# Patient Record
Sex: Male | Born: 1960 | Hispanic: Yes | Marital: Married | Smoking: Never smoker
Health system: Southern US, Community
[De-identification: ages and names within clinical notes are randomized; demographics above are authoritative.]

## PROBLEM LIST (undated history)

## (undated) DIAGNOSIS — I255 Ischemic cardiomyopathy: Secondary | ICD-10-CM

## (undated) DIAGNOSIS — M87052 Idiopathic aseptic necrosis of left femur: Secondary | ICD-10-CM

## (undated) DIAGNOSIS — D509 Iron deficiency anemia, unspecified: Secondary | ICD-10-CM

## (undated) DIAGNOSIS — I2510 Atherosclerotic heart disease of native coronary artery without angina pectoris: Secondary | ICD-10-CM

## (undated) DIAGNOSIS — I776 Arteritis, unspecified: Secondary | ICD-10-CM

## (undated) DIAGNOSIS — I5020 Unspecified systolic (congestive) heart failure: Secondary | ICD-10-CM

## (undated) DIAGNOSIS — I7782 Antineutrophilic cytoplasmic antibody (ANCA) vasculitis: Secondary | ICD-10-CM

## (undated) DIAGNOSIS — E785 Hyperlipidemia, unspecified: Secondary | ICD-10-CM

## (undated) DIAGNOSIS — K2970 Gastritis, unspecified, without bleeding: Secondary | ICD-10-CM

## (undated) DIAGNOSIS — R6520 Severe sepsis without septic shock: Secondary | ICD-10-CM

## (undated) DIAGNOSIS — N1832 Chronic kidney disease, stage 3b: Secondary | ICD-10-CM

## (undated) DIAGNOSIS — I10 Essential (primary) hypertension: Secondary | ICD-10-CM

## (undated) DIAGNOSIS — M87051 Idiopathic aseptic necrosis of right femur: Secondary | ICD-10-CM

## (undated) DIAGNOSIS — A419 Sepsis, unspecified organism: Secondary | ICD-10-CM

## (undated) DIAGNOSIS — N289 Disorder of kidney and ureter, unspecified: Secondary | ICD-10-CM

## (undated) HISTORY — DX: Idiopathic aseptic necrosis of right femur: M87.052

## (undated) HISTORY — PX: CARDIAC SURGERY: SHX584

---

## 2007-07-11 ENCOUNTER — Inpatient Hospital Stay: Payer: Self-pay | Admitting: Internal Medicine

## 2007-07-23 ENCOUNTER — Ambulatory Visit: Payer: Self-pay | Admitting: Family Medicine

## 2016-05-17 DIAGNOSIS — R0602 Shortness of breath: Secondary | ICD-10-CM | POA: Insufficient documentation

## 2016-05-18 DIAGNOSIS — I10 Essential (primary) hypertension: Secondary | ICD-10-CM | POA: Insufficient documentation

## 2016-05-18 DIAGNOSIS — I7782 Antineutrophilic cytoplasmic antibody (ANCA) vasculitis: Secondary | ICD-10-CM | POA: Insufficient documentation

## 2016-05-18 DIAGNOSIS — F1010 Alcohol abuse, uncomplicated: Secondary | ICD-10-CM | POA: Insufficient documentation

## 2016-05-20 HISTORY — PX: LEFT HEART CATH AND CORONARY ANGIOGRAPHY: CATH118249

## 2016-05-21 DIAGNOSIS — R7303 Prediabetes: Secondary | ICD-10-CM | POA: Insufficient documentation

## 2016-05-21 DIAGNOSIS — I240 Acute coronary thrombosis not resulting in myocardial infarction: Secondary | ICD-10-CM | POA: Insufficient documentation

## 2016-05-21 DIAGNOSIS — E785 Hyperlipidemia, unspecified: Secondary | ICD-10-CM | POA: Insufficient documentation

## 2018-06-23 ENCOUNTER — Telehealth: Payer: Self-pay | Admitting: *Deleted

## 2018-06-23 ENCOUNTER — Other Ambulatory Visit: Payer: Self-pay

## 2018-06-23 DIAGNOSIS — Z20822 Contact with and (suspected) exposure to covid-19: Secondary | ICD-10-CM

## 2018-06-23 NOTE — Telephone Encounter (Signed)
Reordered as wrong site entered previous order. Pt scheduled at [facility] at 1415. Requested by [facility].  Pt with chills, cough.  Pts CB # [phone] (sons cell)

## 2018-06-23 NOTE — Telephone Encounter (Signed)
Pt scheduled for covid testing today at [facility] site. Requeted by [facility] Dept. Pt with cough, chills. Pt is coming to site with son and wife, also scheduled at 1415.   Testing process reviewed with son.  CB# [phone]

## 2018-06-25 NOTE — Telephone Encounter (Signed)
Mailed result to pt., per his request.

## 2018-06-25 NOTE — Telephone Encounter (Signed)
Faxed COVID Test results to [facility]. @ [phone].

## 2018-06-28 ENCOUNTER — Telehealth: Payer: Self-pay | Admitting: *Deleted

## 2018-06-28 LAB — NOVEL CORONAVIRUS, NAA: SARS-CoV-2, NAA: DETECTED — AB

## 2018-06-28 NOTE — Telephone Encounter (Signed)
Contacted by Joylene Igo, MLT, ASCP from [employer], pt positive COVID 19; values previously called 06/25/2018, and results faxed to [facility].

## 2019-05-01 ENCOUNTER — Other Ambulatory Visit: Payer: Self-pay

## 2019-05-01 ENCOUNTER — Encounter: Payer: Self-pay | Admitting: Emergency Medicine

## 2019-05-01 DIAGNOSIS — N170 Acute kidney failure with tubular necrosis: Secondary | ICD-10-CM | POA: Diagnosis present

## 2019-05-01 DIAGNOSIS — Z7902 Long term (current) use of antithrombotics/antiplatelets: Secondary | ICD-10-CM

## 2019-05-01 DIAGNOSIS — R6520 Severe sepsis without septic shock: Secondary | ICD-10-CM | POA: Diagnosis present

## 2019-05-01 DIAGNOSIS — I5032 Chronic diastolic (congestive) heart failure: Secondary | ICD-10-CM | POA: Diagnosis present

## 2019-05-01 DIAGNOSIS — N057 Unspecified nephritic syndrome with diffuse crescentic glomerulonephritis: Secondary | ICD-10-CM | POA: Diagnosis present

## 2019-05-01 DIAGNOSIS — I255 Ischemic cardiomyopathy: Secondary | ICD-10-CM | POA: Diagnosis present

## 2019-05-01 DIAGNOSIS — C642 Malignant neoplasm of left kidney, except renal pelvis: Secondary | ICD-10-CM | POA: Diagnosis present

## 2019-05-01 DIAGNOSIS — I252 Old myocardial infarction: Secondary | ICD-10-CM

## 2019-05-01 DIAGNOSIS — N1831 Chronic kidney disease, stage 3a: Secondary | ICD-10-CM | POA: Diagnosis present

## 2019-05-01 DIAGNOSIS — I776 Arteritis, unspecified: Secondary | ICD-10-CM | POA: Diagnosis present

## 2019-05-01 DIAGNOSIS — I130 Hypertensive heart and chronic kidney disease with heart failure and stage 1 through stage 4 chronic kidney disease, or unspecified chronic kidney disease: Secondary | ICD-10-CM | POA: Diagnosis present

## 2019-05-01 DIAGNOSIS — Z821 Family history of blindness and visual loss: Secondary | ICD-10-CM

## 2019-05-01 DIAGNOSIS — R072 Precordial pain: Secondary | ICD-10-CM | POA: Diagnosis not present

## 2019-05-01 DIAGNOSIS — N136 Pyonephrosis: Secondary | ICD-10-CM | POA: Diagnosis present

## 2019-05-01 DIAGNOSIS — Z7982 Long term (current) use of aspirin: Secondary | ICD-10-CM

## 2019-05-01 DIAGNOSIS — I424 Endocardial fibroelastosis: Secondary | ICD-10-CM | POA: Diagnosis present

## 2019-05-01 DIAGNOSIS — A419 Sepsis, unspecified organism: Principal | ICD-10-CM | POA: Diagnosis present

## 2019-05-01 DIAGNOSIS — I2510 Atherosclerotic heart disease of native coronary artery without angina pectoris: Secondary | ICD-10-CM | POA: Diagnosis present

## 2019-05-01 DIAGNOSIS — N401 Enlarged prostate with lower urinary tract symptoms: Secondary | ICD-10-CM | POA: Diagnosis present

## 2019-05-01 DIAGNOSIS — Z79899 Other long term (current) drug therapy: Secondary | ICD-10-CM

## 2019-05-01 DIAGNOSIS — E785 Hyperlipidemia, unspecified: Secondary | ICD-10-CM | POA: Diagnosis present

## 2019-05-01 DIAGNOSIS — Z955 Presence of coronary angioplasty implant and graft: Secondary | ICD-10-CM

## 2019-05-01 DIAGNOSIS — Z20822 Contact with and (suspected) exposure to covid-19: Secondary | ICD-10-CM | POA: Diagnosis present

## 2019-05-01 DIAGNOSIS — C799 Secondary malignant neoplasm of unspecified site: Secondary | ICD-10-CM | POA: Diagnosis present

## 2019-05-01 MED ORDER — OXYCODONE-ACETAMINOPHEN 5-325 MG PO TABS
1.0000 | ORAL_TABLET | ORAL | Status: DC | PRN
  Administered 2019-05-02: 1 via ORAL
  Filled 2019-05-01: qty 1

## 2019-05-01 NOTE — ED Triage Notes (Signed)
Patient with complaint of left lower back pain, frequency and blood in his urine that started this morning.

## 2019-05-02 ENCOUNTER — Encounter: Payer: Self-pay | Admitting: Internal Medicine

## 2019-05-02 ENCOUNTER — Inpatient Hospital Stay
Admission: EM | Admit: 2019-05-02 | Discharge: 2019-05-07 | DRG: 853 | Disposition: A | Discharge status: Home / Self Care | Payer: Self-pay | Attending: Student | Admitting: Student

## 2019-05-02 ENCOUNTER — Emergency Department: Payer: Self-pay

## 2019-05-02 DIAGNOSIS — I5032 Chronic diastolic (congestive) heart failure: Secondary | ICD-10-CM

## 2019-05-02 DIAGNOSIS — C642 Malignant neoplasm of left kidney, except renal pelvis: Secondary | ICD-10-CM

## 2019-05-02 DIAGNOSIS — N179 Acute kidney failure, unspecified: Secondary | ICD-10-CM

## 2019-05-02 DIAGNOSIS — R319 Hematuria, unspecified: Secondary | ICD-10-CM | POA: Diagnosis present

## 2019-05-02 DIAGNOSIS — A419 Sepsis, unspecified organism: Secondary | ICD-10-CM

## 2019-05-02 DIAGNOSIS — R918 Other nonspecific abnormal finding of lung field: Secondary | ICD-10-CM

## 2019-05-02 DIAGNOSIS — R590 Localized enlarged lymph nodes: Secondary | ICD-10-CM

## 2019-05-02 DIAGNOSIS — N1330 Unspecified hydronephrosis: Secondary | ICD-10-CM

## 2019-05-02 DIAGNOSIS — Z01810 Encounter for preprocedural cardiovascular examination: Secondary | ICD-10-CM

## 2019-05-02 DIAGNOSIS — I5020 Unspecified systolic (congestive) heart failure: Secondary | ICD-10-CM

## 2019-05-02 DIAGNOSIS — I2510 Atherosclerotic heart disease of native coronary artery without angina pectoris: Secondary | ICD-10-CM

## 2019-05-02 DIAGNOSIS — N3000 Acute cystitis without hematuria: Secondary | ICD-10-CM

## 2019-05-02 DIAGNOSIS — R6520 Severe sepsis without septic shock: Secondary | ICD-10-CM | POA: Diagnosis present

## 2019-05-02 DIAGNOSIS — R310 Gross hematuria: Secondary | ICD-10-CM

## 2019-05-02 DIAGNOSIS — N390 Urinary tract infection, site not specified: Secondary | ICD-10-CM

## 2019-05-02 DIAGNOSIS — Z515 Encounter for palliative care: Secondary | ICD-10-CM

## 2019-05-02 DIAGNOSIS — R911 Solitary pulmonary nodule: Secondary | ICD-10-CM

## 2019-05-02 DIAGNOSIS — N2889 Other specified disorders of kidney and ureter: Secondary | ICD-10-CM

## 2019-05-02 DIAGNOSIS — I10 Essential (primary) hypertension: Secondary | ICD-10-CM

## 2019-05-02 DIAGNOSIS — Z8679 Personal history of other diseases of the circulatory system: Secondary | ICD-10-CM

## 2019-05-02 HISTORY — DX: Atherosclerotic heart disease of native coronary artery without angina pectoris: I25.10

## 2019-05-02 HISTORY — DX: Disorder of kidney and ureter, unspecified: N28.9

## 2019-05-02 HISTORY — DX: Essential (primary) hypertension: I10

## 2019-05-02 HISTORY — DX: Hyperlipidemia, unspecified: E78.5

## 2019-05-02 HISTORY — DX: Ischemic cardiomyopathy: I25.5

## 2019-05-02 HISTORY — DX: Arteritis, unspecified: I77.6

## 2019-05-02 HISTORY — DX: Unspecified systolic (congestive) heart failure: I50.20

## 2019-05-02 HISTORY — DX: Antineutrophilic cytoplasmic antibody (ANCA) vasculitis: I77.82

## 2019-05-02 LAB — URINALYSIS, COMPLETE (UACMP) WITH MICROSCOPIC
Bilirubin Urine: NEGATIVE
Glucose, UA: 50 mg/dL — AB
Ketones, ur: NEGATIVE mg/dL
Leukocytes,Ua: NEGATIVE
Nitrite: NEGATIVE
Protein, ur: 100 mg/dL — AB
RBC / HPF: >50 RBC/hpf — ABNORMAL HIGH (ref 0–5)
Specific Gravity, Urine: 1.012 (ref 1.005–1.030)
Squamous Epithelial / HPF: NONE SEEN (ref 0–5)
pH: 6 (ref 5.0–8.0)

## 2019-05-02 LAB — BASIC METABOLIC PANEL
Anion gap: 12 (ref 5–15)
BUN: 17 mg/dL (ref 6–20)
CO2: 22 mmol/L (ref 22–32)
Calcium: 8.5 mg/dL — ABNORMAL LOW (ref 8.9–10.3)
Chloride: 96 mmol/L — ABNORMAL LOW (ref 98–111)
Creatinine, Ser: 1.29 mg/dL — ABNORMAL HIGH (ref 0.61–1.24)
GFR calc Af Amer: >60 mL/min (ref >60)
GFR calc non Af Amer: >60 mL/min (ref >60)
Glucose, Bld: 106 mg/dL — ABNORMAL HIGH (ref 70–99)
Potassium: 3.7 mmol/L (ref 3.5–5.1)
Sodium: 130 mmol/L — ABNORMAL LOW (ref 135–145)

## 2019-05-02 LAB — RESPIRATORY PANEL BY RT PCR (FLU A&B, COVID)
Influenza A by PCR: NEGATIVE
Influenza B by PCR: NEGATIVE
SARS Coronavirus 2 by RT PCR: NEGATIVE

## 2019-05-02 LAB — CBC
HCT: 41.9 % (ref 39.0–52.0)
HCT: 43.2 % (ref 39.0–52.0)
Hemoglobin: 14.6 g/dL (ref 13.0–17.0)
Hemoglobin: 14.8 g/dL (ref 13.0–17.0)
MCH: 30.2 pg (ref 26.0–34.0)
MCH: 30.5 pg (ref 26.0–34.0)
MCHC: 33.8 g/dL (ref 30.0–36.0)
MCHC: 35.3 g/dL (ref 30.0–36.0)
MCV: 86.2 fL (ref 80.0–100.0)
MCV: 89.4 fL (ref 80.0–100.0)
Platelets: 173 10*3/uL (ref 150–400)
Platelets: 185 10*3/uL (ref 150–400)
RBC: 4.83 MIL/uL (ref 4.22–5.81)
RBC: 4.86 MIL/uL (ref 4.22–5.81)
RDW: 12.8 % (ref 11.5–15.5)
RDW: 12.9 % (ref 11.5–15.5)
WBC: 11.1 10*3/uL — ABNORMAL HIGH (ref 4.0–10.5)
WBC: 13.7 10*3/uL — ABNORMAL HIGH (ref 4.0–10.5)
nRBC: 0 % (ref 0.0–0.2)
nRBC: 0 % (ref 0.0–0.2)

## 2019-05-02 LAB — LACTIC ACID, PLASMA
Lactic Acid, Venous: 2.4 mmol/L (ref 0.5–1.9)
Lactic Acid, Venous: 1 mmol/L (ref 0.5–1.9)
Lactic Acid, Venous: 1.1 mmol/L (ref 0.5–1.9)

## 2019-05-02 LAB — TROPONIN I (HIGH SENSITIVITY)
Troponin I (High Sensitivity): 12 ng/L (ref <18)
Troponin I (High Sensitivity): 10 ng/L (ref <18)

## 2019-05-02 LAB — APTT: aPTT: 29 seconds (ref 24–36)

## 2019-05-02 LAB — PROTIME-INR
INR: 0.9 (ref 0.8–1.2)
Prothrombin Time: 12.3 seconds (ref 11.4–15.2)

## 2019-05-02 LAB — HIV ANTIBODY (ROUTINE TESTING W REFLEX): HIV Screen 4th Generation wRfx: NONREACTIVE

## 2019-05-02 MED ORDER — ATORVASTATIN CALCIUM 80 MG PO TABS
80.0000 mg | ORAL_TABLET | Freq: Every day | ORAL | Status: DC
  Administered 2019-05-02 – 2019-05-06 (×5): 80 mg via ORAL
  Filled 2019-05-02: qty 1
  Filled 2019-05-02: qty 4
  Filled 2019-05-02: qty 1
  Filled 2019-05-02: qty 4
  Filled 2019-05-02 (×3): qty 1
  Filled 2019-05-02: qty 4
  Filled 2019-05-02: qty 1
  Filled 2019-05-02 (×2): qty 4

## 2019-05-02 MED ORDER — ACETAMINOPHEN 325 MG PO TABS
650.0000 mg | ORAL_TABLET | Freq: Four times a day (QID) | ORAL | Status: DC | PRN
  Administered 2019-05-02 – 2019-05-07 (×4): 650 mg via ORAL
  Filled 2019-05-02 (×4): qty 2

## 2019-05-02 MED ORDER — HYDRALAZINE HCL 20 MG/ML IJ SOLN
10.0000 mg | Freq: Four times a day (QID) | INTRAMUSCULAR | Status: DC | PRN
  Administered 2019-05-02 – 2019-05-05 (×2): 10 mg via INTRAVENOUS
  Filled 2019-05-02 (×2): qty 1

## 2019-05-02 MED ORDER — LACTATED RINGERS IV BOLUS
1000.0000 mL | Freq: Once | INTRAVENOUS | Status: AC
  Administered 2019-05-02: 1000 mL via INTRAVENOUS

## 2019-05-02 MED ORDER — OXYCODONE HCL 5 MG PO TABS
5.0000 mg | ORAL_TABLET | Freq: Once | ORAL | Status: AC
  Administered 2019-05-02: 18:00:00 5 mg via ORAL
  Filled 2019-05-02: qty 1

## 2019-05-02 MED ORDER — METOPROLOL SUCCINATE ER 50 MG PO TB24
150.0000 mg | ORAL_TABLET | Freq: Every day | ORAL | Status: DC
  Administered 2019-05-02 – 2019-05-07 (×5): 150 mg via ORAL
  Filled 2019-05-02 (×5): qty 1

## 2019-05-02 MED ORDER — LABETALOL HCL 5 MG/ML IV SOLN: 10.0000 mg | INTRAVENOUS | Status: DC | PRN

## 2019-05-02 MED ORDER — ACETAMINOPHEN 650 MG RE SUPP: 650.0000 mg | Freq: Four times a day (QID) | RECTAL | Status: DC | PRN

## 2019-05-02 MED ORDER — MORPHINE SULFATE (PF) 2 MG/ML IV SOLN: 2.0000 mg | INTRAVENOUS | Status: DC | PRN

## 2019-05-02 MED ORDER — MORPHINE SULFATE (PF) 4 MG/ML IV SOLN
4.0000 mg | Freq: Once | INTRAVENOUS | Status: AC
  Administered 2019-05-02: 4 mg via INTRAVENOUS
  Filled 2019-05-02: qty 1

## 2019-05-02 MED ORDER — ONDANSETRON HCL 4 MG/2ML IJ SOLN
4.0000 mg | Freq: Once | INTRAMUSCULAR | Status: AC
  Administered 2019-05-02: 4 mg via INTRAVENOUS
  Filled 2019-05-02: qty 2

## 2019-05-02 MED ORDER — MORPHINE SULFATE (PF) 4 MG/ML IV SOLN
4.0000 mg | INTRAVENOUS | Status: DC | PRN
  Administered 2019-05-02 – 2019-05-07 (×10): 4 mg via INTRAVENOUS
  Filled 2019-05-02 (×11): qty 1

## 2019-05-02 MED ORDER — SODIUM CHLORIDE 0.9 % IV SOLN
1.0000 g | Freq: Once | INTRAVENOUS | Status: AC
  Administered 2019-05-02: 03:00:00 1 g via INTRAVENOUS
  Filled 2019-05-02: qty 10

## 2019-05-02 MED ORDER — ONDANSETRON HCL 4 MG/2ML IJ SOLN: 4.0000 mg | Freq: Four times a day (QID) | INTRAMUSCULAR | Status: DC | PRN

## 2019-05-02 MED ORDER — SODIUM CHLORIDE 0.9 % IV SOLN
1.0000 g | INTRAVENOUS | Status: DC
  Administered 2019-05-03 – 2019-05-06 (×4): 1 g via INTRAVENOUS
  Filled 2019-05-02: qty 1
  Filled 2019-05-02: qty 10
  Filled 2019-05-02 (×2): qty 1

## 2019-05-02 MED ORDER — LISINOPRIL 10 MG PO TABS: 10.0000 mg | ORAL_TABLET | Freq: Every day | ORAL | Status: DC

## 2019-05-02 MED ORDER — SODIUM CHLORIDE 0.9 % IV SOLN: INTRAVENOUS | Status: DC

## 2019-05-02 MED ORDER — ONDANSETRON HCL 4 MG PO TABS: 4.0000 mg | ORAL_TABLET | Freq: Four times a day (QID) | ORAL | Status: DC | PRN

## 2019-05-02 NOTE — Progress Notes (Signed)
Communication took place with Dr. Damita Dunnings, she will be ordering another lactic acid

## 2019-05-02 NOTE — Progress Notes (Signed)
NP Ouma notified of PT's bp 168/106, did not want to give prn labatalol at this time. Also asked for clarification on fluid rate.

## 2019-05-02 NOTE — Consult Note (Signed)
Cardiology Consultation:   Patient ID: Mark Donaldson; [id number]; 1960/11/08   Admit date: 05/02/2019 Date of Consult: 05/02/2019  Primary Care Provider: System, Pcp Not In Primary Cardiologist: [facility]   Patient Profile:   Mark Donaldson is a 59 y.o. 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 Clementeen Graham.  History of Present Illness:   Mark Donaldson was admitted to [facility] in 05/2016 with a NSTEMI. Echo at that time showed an EF of 30-35%, mild LVH, Gr2DD, mildly dilated left atrium, mild to moderate MR, aortic sclerosis, and normal RVSF. PET myocardial perfusion imaging showed a moderate in size, mild and severely comminuted completely reversible defect involving the apical, apical anterior, apical lateral, apical inferior, and apical septal segments consistent with ischemia.  EF 20%.  In this setting, he underwent diagnostic LHC which showed significant one-vessel CAD with 90% LAD stenosis status post PCI/DES x 2.  Following evidence-based therapy and percutaneous revascularization repeat echo in 02/2017 showed an improved LV SF with an EF greater than 85%, grade 1 diastolic dysfunction, mild mitral regurgitation, aortic valve sclerosis, low normal RV systolic function, mild tricuspid regurgitation.  He was last seen by his primary cardiologist in 03/2018 and was doing well from a cardiac perspective.  He presented to [facility] on 05/02/2019 with left lower back pain and hematuria.  Imaging demonstrated a large solid mass involving the lower pole of the left kidney concerning for RCC measuring 7.4 cm.  He has been consulted on by urology was recommended for  nephrectomy with timing to be determined.  At baseline, patient has been doing very well from a cardiac perspective.  He indicates he has not followed up with [facility] cardiology because he "has been doing good."  He reports compliance with all medications, last taking clopidogrel on 04/29/2019.  He remains very active at baseline without any symptoms concerning for angina.  No lower extremity swelling, dizziness, presyncope, syncope.  This morning, he did develop some mild anterior wall chest discomfort which is worse to his palpation.  Outside of that, he does not have any issues or complaints.   Past Medical History:  Diagnosis Date  . ANCA-positive vasculitis ([place])   . CAD (coronary artery disease)   . Essential hypertension   . HFrEF (heart failure with reduced ejection fraction) ([place])   . Hyperlipidemia LDL goal <70   . Ischemic cardiomyopathy   . Renal disorder     Past Surgical History:  Procedure Laterality Date  . CARDIAC SURGERY     cardiac cath with 2 stent placement     Home Meds: Prior to Admission medications   Medication Sig Start Date End Date Taking? Authorizing Provider  aspirin 81 MG chewable tablet Chew 1 tablet by mouth daily. 02/24/19 02/24/20 Yes [provider]  atorvastatin (LIPITOR) 80 MG tablet Take 1 tablet by mouth at bedtime. 02/24/19 02/24/20 Yes [provider]  clopidogrel (PLAVIX) 75 MG tablet Take 1 tablet by mouth daily. 02/24/19 02/24/20 Yes [provider]  furosemide (LASIX) 40 MG tablet Take 1 tablet by mouth daily. 02/24/19 02/24/20 Yes [provider]  lisinopril (ZESTRIL) 10 MG tablet Take 1 tablet by mouth daily. 02/24/19  02/24/20 Yes [provider]  metoprolol succinate (TOPROL-XL) 100 MG 24 hr tablet Take 1.5 tablets by mouth daily. 02/24/19 02/24/20 Yes [provider]  spironolactone (ALDACTONE) 25 MG tablet Take 12.5 mg by mouth daily. 02/24/19 09/01/19 Yes [provider]    Inpatient  Medications: Scheduled Meds: . atorvastatin  80 mg Oral QHS  . metoprolol succinate  150 mg Oral Daily   Continuous Infusions: . sodium chloride 125 mL/hr at 05/02/19 0850  . [START ON 05/03/2019] cefTRIAXone (ROCEPHIN)  IV     PRN Meds: acetaminophen **OR** acetaminophen, hydrALAZINE, morphine injection, ondansetron **OR** ondansetron (ZOFRAN) IV  Allergies:  No Known Allergies  Social History:   Social History   Socioeconomic History  . Marital status: Married    Spouse name: Not on file  . Number of children: Not on file  . Years of education: Not on file  . Highest education level: Not on file  Occupational History  . Not on file  Tobacco Use  . Smoking status: Never Smoker  . Smokeless tobacco: Never Used  Substance and Sexual Activity  . Alcohol use: Yes  . Drug use: Not on file  . Sexual activity: Not on file  Other Topics Concern  . Not on file  Social History Narrative  . Not on file   Social Determinants of Health   Financial Resource Strain:   . Difficulty of Paying Living Expenses:   Food Insecurity:   . Worried About Charity fundraiser in the Last Year:   . Arboriculturist in the Last Year:   Transportation Needs:   . Film/video editor (Medical):   Marland Kitchen Lack of Transportation (Non-Medical):   Physical Activity:   . Days of Exercise per Week:   . Minutes of Exercise per Session:   Stress:   . Feeling of Stress :   Social Connections:   . Frequency of Communication with Friends and Family:   . Frequency of Social Gatherings with Friends and Family:   . Attends Religious Services:   . Active Member of Clubs or Organizations:   . Attends Archivist Meetings:   Marland Kitchen Marital Status:   Intimate Partner Violence:   . Fear of Current or Ex-Partner:   . Emotionally Abused:   Marland Kitchen Physically Abused:   . Sexually Abused:      Family History:   Family History  Problem Relation Age of Onset  . Cancer Mother   . Blindness Brother     ROS:   Review of Systems  Constitutional: Negative for chills, diaphoresis, fever, malaise/fatigue and weight loss.  HENT: Negative for congestion.   Eyes: Negative for discharge and redness.  Respiratory: Negative for cough, sputum production, shortness of breath and wheezing.   Cardiovascular: Positive for chest pain. Negative for palpitations, orthopnea, claudication, leg swelling and PND.  Gastrointestinal: Negative for abdominal pain, blood in stool, heartburn, melena, nausea and vomiting.  Genitourinary: Positive for flank pain, frequency and hematuria.  Musculoskeletal: Negative for falls and myalgias.  Skin: Negative for rash.  Neurological: Negative for dizziness, tingling, tremors, sensory change, speech change, focal weakness, loss of consciousness and weakness.  Endo/Heme/Allergies: Does not bruise/bleed easily.  Psychiatric/Behavioral: Negative for substance abuse. The patient is not nervous/anxious.   All other systems reviewed and are negative.     Physical Exam/Data:   Vitals:   05/02/19 0541 05/02/19 0813 05/02/19 1000 05/02/19 1327  BP: (!) 168/108 (!) 168/102 (!) 151/93 (!) 163/93  Pulse: Marland Kitchen)  101 86 94 (!) 106  Resp: 16 16  16   Temp: 98.3 F (36.8 C) 98.3 F (36.8 C)  98.8 F (37.1 C)  TempSrc: Oral Oral  Oral  SpO2: 95% 100%  99%  Weight: 69.9 kg     Height: 5\' 5"  (1.651 m)       Intake/Output Summary (Last 24 hours) at 05/02/2019 1516 Last data filed at 05/02/2019 1300 Gross per 24 hour  Intake -  Output 1050 ml  Net -1050 ml   Filed Weights   05/01/19 2336 05/02/19 0541  Weight: 71.7 kg 69.9 kg   Body mass index is 25.64 kg/m.   Physical Exam: General: Well developed, well nourished, in no acute distress. Head: Normocephalic, atraumatic, sclera non-icteric, no xanthomas, nares without discharge.  Neck: Negative for carotid bruits. JVD not elevated. Lungs: Clear bilaterally to auscultation without wheezes, rales, or rhonchi. Breathing is unlabored.  Heart: RRR with S1 S2. No murmurs, rubs, or gallops appreciated.  Palpation to the anterior chest wall reproduces his pain. Abdomen: Soft, non-tender, non-distended with normoactive bowel sounds. No hepatomegaly. No rebound/guarding. No obvious abdominal masses. Msk:  Strength and tone appear normal for age. Extremities: No clubbing or cyanosis. No edema. Distal pedal pulses are 2+ and equal bilaterally. Neuro: Alert and oriented X 3. No facial asymmetry. No focal deficit. Moves all extremities spontaneously. Psych:  Responds to questions appropriately with a normal affect.   EKG:  The EKG was personally reviewed and demonstrates: none available for review  Telemetry:  Telemetry was personally reviewed and demonstrates: SR  Weights: [employer]   05/01/19 2336 05/02/19 0541  Weight: 71.7 kg 69.9 kg    Relevant CV Studies:  2D echo 05/2016:  Left ventricular hypertrophy - mild  Moderately to severely decreased left ventricular systolic function,  ejection fraction 30 to 20%  Diastolic dysfunction - grade II (elevated filling pressures)  Dilated left atrium - mild  Mitral regurgitation - mild to moderate  Aortic sclerosis  Normal right ventricular systolic function __________  LHC 05/2016: Coronary Angiography  Dominance: Right  Left Main: The left main (LMCA) is a large-caliber vessel that originates  from the left coronary sinus. It bifurcates into the left anterior  descending (LAD) and left circumflex (LCx) arteries. There is a 25%  proximal LMCA stenosis.  LAD: The LAD is a large-caliber vessel that gives off two diagonal (D)  branches before it wraps around the apex. D1 is a moderate-caliber vessel  with 70% ostial stenosis. D2 is a large-caliber vessel. There is a 90% mid  LAD stenosis and a 40% distal LAD stenosis.  Left Circumflex: The LCx is a large-caliber vessel that gives off four  obtuse marginal (OM) branches and then continues as a small vessel in  the  AV groove. OM1-OM3 are very small vessels. OM4 is a large-caliber vessel.  There is mild diffuse disease in the LCx.  Right Coronary: The right coronary artery (RCA) is a large-caliber vessel  originating from the right coronary sinus. It bifurcates distally into the  posterior descending artery (PDA) and a posterolateral branch (PL)  consistent with a right dominant system. There is mild diffuse disease  predominantly in the proximal RCA.  Based on the above findings, the decision was made to perform PCI on the  mid LAD lesion(s).  PCI Procedural details Percutaneous coronary intervention performed of the mid LAD lesion. Anticoagulants: Urgent, ASA, Ticagrelor, Unfractionated Heparin Guide catheter: EBU 3.5 Guide Wire: 0.014" [place] Pre-Dilation Balloon: 2.0 mm Stent:  Xience (drug eluting stent) - 2.5x18, 2.5x8 Post-Dilation Balloon: 3.0 mm Result:  Excellent angiographic result with TIMI 3 flow  __________  2D echo 02/2017:  Left ventricular hypertrophy - mild to moderate  Overall normal left ventricular systolic function, ejection fraction >  49%  Diastolic dysfunction - grade I (normal filling pressures)  Degenerative mitral valve disease  Mitral regurgitation - mild  Aortic sclerosis  Low normal right ventricular systolic function  Tricuspid regurgitation - mild  Peak LV global longitudinal strain: -13.4%   Laboratory Data:  Chemistry Recent Labs  Lab 05/01/19 2344  NA 130*  K 3.7  CL 96*  CO2 22  GLUCOSE 106*  BUN 17  CREATININE 1.29*  CALCIUM 8.5*  GFRNONAA >60  GFRAA >60  ANIONGAP 12    No results for input(s): PROT, ALBUMIN, AST, ALT, ALKPHOS, BILITOT in the last 168 hours. Hematology Recent Labs  Lab 05/01/19 2344 05/02/19 0629  WBC 13.7* 11.1*  RBC 4.83 4.86  HGB 14.6 14.8  HCT 43.2 41.9  MCV 89.4 86.2  MCH 30.2 30.5  MCHC 33.8 35.3  RDW 12.8 12.9  PLT 185 173   Cardiac EnzymesNo results for input(s): TROPONINI in  the last 168 hours. No results for input(s): TROPIPOC in the last 168 hours.  BNPNo results for input(s): BNP, PROBNP in the last 168 hours.  DDimer No results for input(s): DDIMER in the last 168 hours.  Radiology/Studies:  CT Renal Stone Study  Result Date: 05/02/2019 IMPRESSION: Large solid mass involving the lower pole of the left kidney concerning for renal cell carcinoma measuring up to 7.4 cm. Mild left hydronephrosis, possibly related to mass effect or blood clot within the left ureter. Diffusely thick walled bladder, likely related to prostate enlargement. Electronically Signed   By: Rolm Baptise M.D.   On: 05/02/2019 01:24    Assessment and Plan:   1. Preoperative cardiac risk stratification: -Planning for nephrectomy for renal mass suspicious for RCC  -Obtain preoperative EKG and echo -Revised Cardiac Index: He is high risk for noncardiac surgery with an estimated risk of >11% for adverse cardiac even in the perioperative timeframe  -In the setting of lack on anginal symptoms, no further cardiac testing or intervention with further reduce this risk  -[facility] Activity Status Index: >4 METs -Further recommendations regarding risk stratification pending the above outstanding studies   2. CAD involving the native coronary arteries s/p PCI to the LAD without angina: -He does note some mild intermittent chest discomfort that has developed earlier this morning which is reproducible to his palpation and does not feel similar to his prior angina.  On exam his anterior chest wall is tender to palpation -Check EKG and HS-Tn x 2 -Hold Plavix in the preoperative timeframe, last dose 04/29/2019 (will need to be off 7 days) -Recommend continuing ASA 81 mg daily unless contraindicated from a surgical perspective  -No plans for inpatient ischemic work up at this time if echo demonstrates preserved LVSF  3.  HFrEF secondary to ICM: -Prior EF of 35% subsequently normalized by follow-up echo following  percutaneous revascularization and GDMT -Toprol XL -PTA lisinopril and spironolactone have been held with concern for ATN -His renal function at presentation was consistent with prior values in 2018 -If his renal function remains stable, recommend resuming GDMT -Echo pending  4.  Left renal mass suspicious for RCC with sepsis secondary to UTI: -Urology has been consulted with recommendation to proceed with nephrectomy with timing to be determined -Management per primary service  5.  HTN: -Blood pressure suboptimally controlled -Remains on PTA Toprol XL -Recommend resuming PTA lisinopril and spironolactone if renal function remains stable  -PRN hydralazine   6.  HLD: -Last LDL 77 from 2018 with goal being less than 70 -PTA Lipitor   7.  CKD stage II: -Baseline renal function appears to be around 1.2 when reviewing labs from 2018   For questions or updates, please contact [place] Please consult [email or web address] for contact info under Cardiology/STEMI.   Signed, Christell Faith, PA-C [facility] Pager: [phone] 05/02/2019, 3:16 PM

## 2019-05-02 NOTE — Progress Notes (Signed)
Bedside Rn Anderson Malta to ask about repeating lactic acid

## 2019-05-02 NOTE — ED Provider Notes (Signed)
[facility] Emergency Department Provider Note  ____________________________________________  Time seen: Approximately 2:32 AM  I have reviewed the triage vital signs and the nursing notes.   HISTORY  Chief Complaint Flank Pain and Hematuria   HPI Mark Donaldson is a 59 y.o. male with history of CAD status post stent, CHF with preserved EF who presents for evaluation of flank pain and hematuria.  Symptoms started this morning.  Is complaining of a pressure-like pain located in his left flank which is severe and constant since this morning.  Has had frequency, dysuria and hematuria.  No fever or chills.  No abdominal pain, no chest pain or shortness of breath.   Past Medical History:  Diagnosis Date  . Renal disorder     Past Surgical History:  Procedure Laterality Date  . CARDIAC SURGERY     cardiac cath with 2 stent placement    Prior to Admission medications   Not on File    Allergies Patient has no known allergies.  FH Blindness Brother  from birth  Cancer Mother      Social History Social History   Tobacco Use  . Smoking status: Never Smoker  . Smokeless tobacco: Never Used  Substance Use Topics  . Alcohol use: Yes  . Drug use: Not on file    Review of Systems  Constitutional: Negative for fever. Eyes: Negative for visual changes. ENT: Negative for sore throat. Neck: No neck pain  Cardiovascular: Negative for chest pain. Respiratory: Negative for shortness of breath. Gastrointestinal: Negative for abdominal pain, vomiting or diarrhea. Genitourinary: + L flank pain, dysuria, frequency, hematuria Musculoskeletal: Negative for back pain. Skin: Negative for rash. Neurological: Negative for headaches, weakness or numbness. Psych: No SI or HI  ____________________________________________   PHYSICAL EXAM:  VITAL SIGNS: ED Triage Vitals  Enc Vitals Group     BP 05/01/19 2343 (!) 160/88     Pulse Rate 05/01/19  2343 (!) 102     Resp 05/01/19 2343 18     Temp 05/01/19 2343 98.3 F (36.8 C)     Temp Source 05/01/19 2343 Oral     SpO2 05/01/19 2343 99 %     Weight 05/01/19 2336 158 lb (71.7 kg)     Height 05/01/19 2336 5\' 6"  (1.676 m)     Head Circumference --      Peak Flow --      Pain Score 05/01/19 2336 8     Pain Loc --      Pain Edu? --      Excl. in [place]? --     Constitutional: Alert and oriented. Well appearing and in no apparent distress. HEENT:      Head: Normocephalic and atraumatic.         Eyes: Conjunctivae are normal. Sclera is non-icteric.       Mouth/Throat: Mucous membranes are moist.       Neck: Supple with no signs of meningismus. Cardiovascular: Tachycardic with regular rhythm. No murmurs, gallops, or rubs.  Respiratory: Normal respiratory effort. Lungs are clear to auscultation bilaterally.  Gastrointestinal: Soft, non tender, and non distended with positive bowel sounds. No rebound or guarding. Genitourinary: L CVA tenderness. Musculoskeletal:  No edema, cyanosis, or erythema of extremities. Neurologic: Normal speech and language. Face is symmetric. Moving all extremities. No gross focal neurologic deficits are appreciated. Skin: Skin is warm, dry and intact. No rash noted. Psychiatric: Mood and affect are normal. Speech and behavior are normal.  ____________________________________________  LABS (all labs ordered are listed, but only abnormal results are displayed)  Labs Reviewed  URINALYSIS, COMPLETE (UACMP) WITH MICROSCOPIC - Abnormal; Notable for the following components:      Result Value   Color, Urine RED (*)    APPearance CLOUDY (*)    Glucose, UA 50 (*)    Hgb urine dipstick LARGE (*)    Protein, ur 100 (*)    RBC / HPF >50 (*)    Bacteria, UA MANY (*)    All other components within normal limits  BASIC METABOLIC PANEL - Abnormal; Notable for the following components:   Sodium 130 (*)    Chloride 96 (*)    Glucose, Bld 106 (*)    Creatinine, Ser  1.29 (*)    Calcium 8.5 (*)    All other components within normal limits  CBC - Abnormal; Notable for the following components:   WBC 13.7 (*)    All other components within normal limits  CULTURE, BLOOD (ROUTINE X 2)  CULTURE, BLOOD (ROUTINE X 2)  RESPIRATORY PANEL BY RT PCR (FLU A&B, COVID)  URINE CULTURE  LACTIC ACID, PLASMA  HIV ANTIBODY (ROUTINE TESTING W REFLEX)  CBC   ____________________________________________  EKG  none ____________________________________________  RADIOLOGY  I have personally reviewed the images performed during this visit and I agree with the Radiologist's read.   Interpretation by Radiologist:  CT Renal Stone Study  Result Date: 05/02/2019 CLINICAL DATA:  Left flank pain, lower back pain EXAM: CT ABDOMEN AND PELVIS WITHOUT CONTRAST TECHNIQUE: Multidetector CT imaging of the abdomen and pelvis was performed following the standard protocol without IV contrast. COMPARISON:  None. FINDINGS: Lower chest: Lung bases are clear. No effusions. Heart is normal size. Hepatobiliary: No focal hepatic abnormality. Gallbladder unremarkable. Pancreas: No focal abnormality or ductal dilatation. Spleen: No focal abnormality.  Normal size. Adrenals/Urinary Tract: Large solid mass is seen in the lower pole of the left kidney measuring 7.4 x 6.9 cm. Central calcifications noted. Findings concerning for renal cell carcinoma. There is mild left hydronephrosis which may be related to mass effect from the mass on the adjacent proximal left ureter. High-density material is seen within the left renal collecting system and ureter compatible with blood. No hydronephrosis on the right. Adrenal glands are unremarkable. Bladder wall appears diffusely thickened. Stomach/Bowel: Normal appendix. Stomach, large and small bowel grossly unremarkable. Vascular/Lymphatic: No evidence of aneurysm or adenopathy. Reproductive: Prostate enlargement. Other: 2 No free fluid or free air. Musculoskeletal:  No acute bony abnormality. IMPRESSION: Large solid mass involving the lower pole of the left kidney concerning for renal cell carcinoma measuring up to 7.4 cm. Mild left hydronephrosis, possibly related to mass effect or blood clot within the left ureter. Diffusely thick walled bladder, likely related to prostate enlargement. Electronically Signed   By: Rolm Baptise M.D.   On: 05/02/2019 01:24     ____________________________________________   PROCEDURES  Procedure(s) performed: yes .1-3 Lead EKG Interpretation Performed by: Rudene Re, MD Authorized by: Rudene Re, MD     Interpretation: normal     ECG rate:  105   ECG rate assessment: tachycardic     Rhythm: sinus rhythm     Ectopy: none     Conduction: normal     Critical Care performed: yes  CRITICAL CARE Performed by: Rudene Re  ?  Total critical care time: 35 min  Critical care time was exclusive of separately billable procedures and treating other patients.  Critical care was necessary to  treat or prevent imminent or life-threatening deterioration.  Critical care was time spent personally by me on the following activities: development of treatment plan with patient and/or surrogate as well as nursing, discussions with consultants, evaluation of patient's response to treatment, examination of patient, obtaining history from patient or surrogate, ordering and performing treatments and interventions, ordering and review of laboratory studies, ordering and review of radiographic studies, pulse oximetry and re-evaluation of patient's condition.  ____________________________________________   INITIAL IMPRESSION / ASSESSMENT AND PLAN / ED COURSE   59 y.o. male with history of CAD status post stent, CHF with preserved EF who presents for evaluation of flank pain and hematuria.  Patient is in no obvious distress, tachycardic but afebrile with a left flank tenderness, abdomen is soft with no  tenderness.  Old medical records reviewed.  UA positive for infection and blood.  CT visualized and evaluated by me showing renal mass concerning for renal cancer which is a new diagnosis for patient.  There is also some obstruction of the ureter by the tumor or possibly a blood clot. Read confirmed by radiology. Patient meets sepsis criteria with tachycardia, leukocytosis, and source of infection. Lactic and blood cultures pending. Will give IV morphine for pain, IV zofran, IV rocephin, and IVF. Will admit patient to hospitalist service.  Patient placed on telemetry to monitor cardiovascular status.       _____________________________________________ Please note:  Patient was evaluated in Emergency Department today for the symptoms described in the history of present illness. Patient was evaluated in the context of the global COVID-19 pandemic, which necessitated consideration that the patient might be at risk for infection with the SARS-CoV-2 virus that causes COVID-19. Institutional protocols and algorithms that pertain to the evaluation of patients at risk for COVID-19 are in a state of rapid change based on information released by regulatory bodies including the CDC and federal and state organizations. These policies and algorithms were followed during the patient's care in the ED.  Some ED evaluations and interventions may be delayed as a result of limited staffing during the pandemic.   [place] Controlled Substance Database was reviewed by me. ____________________________________________   FINAL CLINICAL IMPRESSION(S) / ED DIAGNOSES   Final diagnoses:  Malignant neoplasm of left kidney (HCC)  Sepsis due to urinary tract infection ([place])      NEW MEDICATIONS STARTED DURING THIS VISIT:  ED Discharge Orders    None       Note:  This document was prepared using Dragon voice recognition software and may include unintentional dictation errors.    Alfred Levins, [place], MD 05/02/19  [phone]

## 2019-05-02 NOTE — H&P (View-Only) (Signed)
Urology Consult  I have been asked to see the patient by Dr. Damita Dunnings, for evaluation and management of left renal mass with left hydronephrosis and concern for urosepsis.  Chief Complaint: Left flank pain, gross hematuria  History of Present Illness: Mark Donaldson is a 59 y.o. year old male with PMH CAD s/p cardiac stent and systolic heart failure who presented to the ED overnight with reports of a 1 day history of left flank pain and gross hematuria.  UA notable for >50 RBCs/hpf, 6-10 WBCs/hpf, and many bacteria.  He underwent CT stone study, which revealed a 7.4 cm left lower pole renal mass concerning for RCC with mild left hydronephrosis secondary to mass-effect versus blood clot within the left ureter as well as a thick-walled bladder likely secondary to prostatomegaly.  Lactate normalized to 1.0 this morning, down from 2.4 at admission.  WBC count downtrending, 11.1 this morning.  Creatinine 1.29 on admission.  Urine culture pending, blood culture pending with no growth at <12 hours. On antibiotics as below.  Interview today completed with the aid of a Spanish interpreter.  Patient reports a 1 month history of mild left flank pain, which she assumed was musculoskeletal in origin.  He denies a personal or family history of kidney, bladder, prostate, and testicular cancer.  He denies gross hematuria prior to yesterday.  He denies unintentional weight loss.  Per chart review, patient has a baseline creatinine around 1.25.  He was previously a patient of [facility] neph[facility], but discontinued medications and care around 2016.  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  No care notes available from this time.  Today, patient reports his left flank pain  has improved since his overnight admission.  Overall, he is comfortable appearing with no acute concerns.  He has been n.p.o. since yesterday.  He believes his last dose of Plavix was 3 days ago.  Anti-infectives (From admission, onward)   Start     Dose/Rate Route Frequency Ordered Stop   05/03/19 0300  cefTRIAXone (ROCEPHIN) 1 g in sodium chloride 0.9 % 100 mL IVPB     1 g 200 mL/hr over 30 Minutes Intravenous Every 24 hours 05/02/19 0307     05/02/19 0230  cefTRIAXone (ROCEPHIN) 1 g in sodium chloride 0.9 % 100 mL IVPB     1 g 200 mL/hr over 30 Minutes Intravenous  Once 05/02/19 0225 05/02/19 0406      Past Medical History:  Diagnosis Date  . Renal disorder     Past Surgical History:  Procedure Laterality Date  . CARDIAC SURGERY     cardiac cath with 2 stent placement    Home Medications:  Current Meds  Medication Sig  . aspirin 81 MG chewable tablet Chew 1 tablet by mouth daily.  Marland Kitchen atorvastatin (LIPITOR) 80 MG tablet Take 1 tablet by mouth at bedtime.  . clopidogrel (PLAVIX) 75 MG tablet Take 1 tablet by mouth daily.  . furosemide (LASIX) 40 MG tablet Take 1 tablet by mouth daily.  Marland Kitchen lisinopril (ZESTRIL) 10 MG tablet Take 1 tablet by mouth daily.  . metoprolol succinate (TOPROL-XL) 100 MG 24 hr tablet Take 1.5 tablets by mouth daily.  Marland Kitchen spironolactone (ALDACTONE) 25 MG tablet Take 12.5 mg by mouth daily.    Allergies: No Known Allergies  No family  history on file.  Social History:  reports that he has never smoked. He has never used smokeless tobacco. He reports current alcohol use. No history on file for drug.  ROS: A complete review of systems was performed.  All systems are negative except for pertinent findings as noted.  Physical Exam:  Vital signs in last 24 hours: Temp:  [98.3 F (36.8 C)] 98.3 F (36.8 C) (04/19 0813) Pulse Rate:  [86-112] 86 (04/19 0813) Resp:  [10-18] 16 (04/19 0813) BP: (160-168)/(88-108) 168/102 (04/19 0813) SpO2:  [94 %-100 %] 100  % (04/19 0813) Weight:  [69.9 kg-71.7 kg] 69.9 kg (04/19 0541) Constitutional:  Alert and oriented, no acute distress HEENT: [place] AT, moist mucus membranes Cardiovascular: No clubbing, cyanosis, or edema. Respiratory: Normal respiratory effort GU: No CVA tenderness Skin: No rashes, bruises or suspicious lesions Neurologic: Grossly intact, no focal deficits, moving all 4 extremities Psychiatric: Normal mood and affect  Laboratory Data:  Recent Labs    05/01/19 2344 05/02/19 0629  WBC 13.7* 11.1*  HGB 14.6 14.8  HCT 43.2 41.9   Recent Labs    05/01/19 2344  NA 130*  K 3.7  CL 96*  CO2 22  GLUCOSE 106*  BUN 17  CREATININE 1.29*  CALCIUM 8.5*   Recent Labs    05/02/19 0629  INR 0.9   Urinalysis    Component Value Date/Time   COLORURINE RED (A) 05/01/2019 2344   APPEARANCEUR CLOUDY (A) 05/01/2019 2344   LABSPEC 1.012 05/01/2019 2344   PHURINE 6.0 05/01/2019 2344   GLUCOSEU 50 (A) 05/01/2019 2344   HGBUR LARGE (A) 05/01/2019 2344   [place] NEGATIVE 05/01/2019 2344   [place] 05/01/2019 2344   PROTEINUR 100 (A) 05/01/2019 2344   NITRITE NEGATIVE 05/01/2019 2344   LEUKOCYTESUR NEGATIVE 05/01/2019 2344   Results for orders placed or performed during the hospital encounter of 05/02/19  Blood culture (routine x 2)     Status: None (Preliminary result)   Collection Time: 05/02/19  2:33 AM   Specimen: BLOOD  Result Value Ref Range Status   Specimen Description BLOOD LEFT WRIST  Final   Special Requests   Final    BOTTLES DRAWN AEROBIC AND ANAEROBIC Blood Culture adequate volume   Culture   Final    NO GROWTH < 12 HOURS Performed at [facility], [address] [place], [place] [address]    Report Status PENDING  Incomplete  Blood culture (routine x 2)     Status: None (Preliminary result)   Collection Time: 05/02/19  2:34 AM   Specimen: BLOOD  Result Value Ref Range Status   Specimen Description BLOOD LEFT AC  Final   Special Requests    Final    BOTTLES DRAWN AEROBIC AND ANAEROBIC Blood Culture adequate volume   Culture   Final    NO GROWTH < 12 HOURS Performed at [facility], [address] [place], [place] [address]    Report Status PENDING  Incomplete  Respiratory Panel by RT PCR (Flu A&B, Covid) - Nasopharyngeal Swab     Status: None   Collection Time: 05/02/19  2:34 AM   Specimen: Nasopharyngeal Swab  Result Value Ref Range Status   SARS Coronavirus 2 by RT PCR NEGATIVE NEGATIVE Final    Comment: (NOTE) SARS-CoV-2 target nucleic acids are NOT DETECTED. The SARS-CoV-2 RNA is generally detectable in upper respiratoy specimens during the acute phase of infection. The lowest concentration of SARS-CoV-2 viral copies this assay can detect is  131 copies/mL. A negative result does not preclude SARS-Cov-2 infection and should not be used as the sole basis for treatment or other patient management decisions. A negative result may occur with  improper specimen collection/handling, submission of specimen other than nasopharyngeal swab, presence of viral mutation(s) within the areas targeted by this assay, and inadequate number of viral copies (<131 copies/mL). A negative result must be combined with clinical observations, patient history, and epidemiological information. The expected result is Negative. Fact Sheet for Patients:  PinkCheek.be Fact Sheet for Healthcare Providers:  GravelBags.it This test is not yet ap proved or cleared by the [place] FDA and  has been authorized for detection and/or diagnosis of SARS-CoV-2 by FDA under an Emergency Use Authorization (EUA). This EUA will remain  in effect (meaning this test can be used) for the duration of the COVID-19 declaration under Section 564(b)(1) of the Act, 21 U.S.C. section 360bbb-3(b)(1), unless the authorization is terminated or revoked sooner.    Influenza A by PCR NEGATIVE NEGATIVE  Final   Influenza B by PCR NEGATIVE NEGATIVE Final    Comment: (NOTE) The Xpert Xpress SARS-CoV-2/FLU/RSV assay is intended as an aid in  the diagnosis of influenza from Nasopharyngeal swab specimens and  should not be used as a sole basis for treatment. Nasal washings and  aspirates are unacceptable for Xpert Xpress SARS-CoV-2/FLU/RSV  testing. Fact Sheet for Patients: PinkCheek.be Fact Sheet for Healthcare Providers: GravelBags.it This test is not yet approved or cleared by the [place] FDA and  has been authorized for detection and/or diagnosis of SARS-CoV-2 by  FDA under an Emergency Use Authorization (EUA). This EUA will remain  in effect (meaning this test can be used) for the duration of the  Covid-19 declaration under Section 564(b)(1) of the Act, 21  U.S.C. section 360bbb-3(b)(1), unless the authorization is  terminated or revoked. Performed at [facility], [address], [place],  [address]    Radiologic Imaging: CT Renal Stone Study  Result Date: 05/02/2019 CLINICAL DATA:  Left flank pain, lower back pain EXAM: CT ABDOMEN AND PELVIS WITHOUT CONTRAST TECHNIQUE: Multidetector CT imaging of the abdomen and pelvis was performed following the standard protocol without IV contrast. COMPARISON:  None. FINDINGS: Lower chest: Lung bases are clear. No effusions. Heart is normal size. Hepatobiliary: No focal hepatic abnormality. Gallbladder unremarkable. Pancreas: No focal abnormality or ductal dilatation. Spleen: No focal abnormality.  Normal size. Adrenals/Urinary Tract: Large solid mass is seen in the lower pole of the left kidney measuring 7.4 x 6.9 cm. Central calcifications noted. Findings concerning for renal cell carcinoma. There is mild left hydronephrosis which may be related to mass effect from the mass on the adjacent proximal left ureter. High-density material is seen within the left renal  collecting system and ureter compatible with blood. No hydronephrosis on the right. Adrenal glands are unremarkable. Bladder wall appears diffusely thickened. Stomach/Bowel: Normal appendix. Stomach, large and small bowel grossly unremarkable. Vascular/Lymphatic: No evidence of aneurysm or adenopathy. Reproductive: Prostate enlargement. Other: 2 No free fluid or free air. Musculoskeletal: No acute bony abnormality. IMPRESSION: Large solid mass involving the lower pole of the left kidney concerning for renal cell carcinoma measuring up to 7.4 cm. Mild left hydronephrosis, possibly related to mass effect or blood clot within the left ureter. Diffusely thick walled bladder, likely related to prostate enlargement. Electronically Signed   By: Rolm Baptise M.D.   On: 05/02/2019 01:24   I personally reviewed the imaging above and note the presence  of a large left lower pole renal mass with mild left hydronephrosis and a thick-walled bladder with an enlarged prostate.  Assessment & Plan:  59 year old male with a history of cardiac comorbidities and ANCA vasculitis presents overnight with sudden onset gross hematuria and left flank pain, with findings of likely large left lower pole RCC with associated mild left hydronephrosis due to mass-effect versus blood clot within the ureter and likely UTI.  Pain well controlled overnight, no acute distress this a.m.  I had a lengthy conversation with the patient today regarding his CT findings.  I explained that his left renal mass is consistent with a kidney cancer and that these cancers are typically managed surgically.  Patient will require left nephrectomy for management of his left renal mass.  Recommend cardiac clearance in advance of this; patient will also need to be off Plavix for at least 1 week in advance of surgery. May proceed with nephrectomy during his current admission or as an outpatient, depending on his length of stay.  He will require close nephrology  follow-up thereafter due to his history of vasculitis.  As patient is well-appearing this morning, will defer ureteral stent placement for management of hydronephrosis unless he acutely decompensates or his left flank pain acutely worsens.  Recommendations: -Advance diet -Continue to hold Plavix -Consider cardiology consult for cardiac clearance in advance of recommended nephrectomy -Agree with empiric antibiotics, continue to follow urine cultures  Thank you for involving me in this patient's care, I will continue to follow along.  Debroah Loop, PA-C 05/02/2019 8:56 AM

## 2019-05-02 NOTE — Progress Notes (Signed)
CODE SEPSIS - PHARMACY COMMUNICATION  **Broad Spectrum Antibiotics should be administered within 1 hour of Sepsis diagnosis**  Time Code Sepsis Called/Page Received: 0231  Antibiotics Ordered: Rocephin  Time of 1st antibiotic administration: 0302  Additional action taken by pharmacy: n/a  If necessary, Name of Provider/Nurse Contacted: n/a    Ena Dawley ,PharmD Clinical Pharmacist  05/02/2019  3:19 AM

## 2019-05-02 NOTE — H&P (Addendum)
History and Physical    Mark Donaldson NTI:[id number] DOB: Aug 22, 1960 DOA: 05/02/2019  PCP: No primary care provider on file.   Patient coming from: Home  I have personally briefly reviewed patient's old medical records in [place]  Chief Complaint: Left flank pain, blood in urine  HPI: Mark Donaldson is a 59 y.o. male with medical history significant for CAD with history of PCI to the LAD for 90% stenosis, history of systolic heart failure secondary to ischemic cardiomyopathy now with preserved EF,who presents to the emergency room with sudden onset severe pressure-like pain located in his left flank associated with urinary frequency and blood in the urine.  He denies fever or chills.  Pain is nonradiating.  He denies chest pains or shortness of breath and denies nausea vomiting  ED Course: In the emergency room he was mildly tachycardic at 120 with otherwise normal vitals.  Blood work significant for WBC 13,700, sodium 130 creatinine 1.29, baseline.  CT abdomen renal stone protocol showed large mass left kidney suspicious for renal cell carcinoma with mild left-sided hydronephrosis either secondary to mass-effect small blood clot in the ureter.  Patient started on IV Rocephin.  Hospitalist consulted  Review of Systems: As per HPI otherwise 10 point review of systems negative.    Past Medical History:  Diagnosis Date  . Renal disorder     Past Surgical History:  Procedure Laterality Date  . CARDIAC SURGERY     cardiac cath with 2 stent placement     reports that he has never smoked. He has never used smokeless tobacco. He reports current alcohol use. No history on file for drug.  No Known Allergies  No family history on file.   Prior to Admission medications   Not on File    Physical Exam: Vitals:   05/01/19 2336 05/01/19 2343  BP:  (!) 160/88  Pulse:  (!) 102  Resp:  18  Temp:  98.3 F (36.8 C)  TempSrc:  Oral  SpO2:  99%  Weight: 71.7  kg   Height: 5\' 6"  (1.676 m)      Vitals:   05/01/19 2336 05/01/19 2343  BP:  (!) 160/88  Pulse:  (!) 102  Resp:  18  Temp:  98.3 F (36.8 C)  TempSrc:  Oral  SpO2:  99%  Weight: 71.7 kg   Height: 5\' 6"  (1.676 m)     Constitutional: Alert and awake, oriented x3, appears in pain discomfort Eyes: PERLA, EOMI, irises appear normal, anicteric sclera,  ENMT: external ears and nose appear normal, normal hearing             Lips appears normal, oropharynx mucosa, tongue, posterior pharynx appear normal  Neck: neck appears normal, no masses, normal ROM, no thyromegaly, no JVD  CVS: S1-S2 clear, tachycardic no murmur rubs or gallops,  , no carotid bruits, pedal pulses palpable, No LE edema Respiratory:  clear to auscultation bilaterally, no wheezing, rales or rhonchi. Respiratory effort normal. No accessory muscle use.  Abdomen: Left CVA tenderness, nondistended, normal bowel sounds, no hepatosplenomegaly, no hernias Musculoskeletal: : no cyanosis, clubbing , no contractures or atrophy Neuro: Cranial nerves II-XII intact, sensation, reflexes normal, strength Psych: judgement and insight appear normal, stable mood and affect,  Skin: no rashes or lesions or ulcers, no induration or nodules   Labs on Admission: I have personally reviewed following labs and imaging studies  CBC: Recent Labs  Lab 05/01/19 2344  WBC 13.7*  HGB 14.6  HCT 43.2  MCV 89.4  PLT 785   Basic Metabolic Panel: Recent Labs  Lab 05/01/19 2344  NA 130*  K 3.7  CL 96*  CO2 22  GLUCOSE 106*  BUN 17  CREATININE 1.29*  CALCIUM 8.5*   GFR: Estimated Creatinine Clearance: 57 mL/min (A) (by C-G formula based on SCr of 1.29 mg/dL (H)). Liver Function Tests: No results for input(s): AST, ALT, ALKPHOS, BILITOT, PROT, ALBUMIN in the last 168 hours. No results for input(s): LIPASE, AMYLASE in the last 168 hours. No results for input(s): AMMONIA in the last 168 hours. Coagulation Profile: No results for  input(s): INR, PROTIME in the last 168 hours. Cardiac Enzymes: No results for input(s): CKTOTAL, CKMB, CKMBINDEX, TROPONINI in the last 168 hours. BNP (last 3 results) No results for input(s): PROBNP in the last 8760 hours. HbA1C: No results for input(s): HGBA1C in the last 72 hours. CBG: No results for input(s): GLUCAP in the last 168 hours. Lipid Profile: No results for input(s): CHOL, HDL, LDLCALC, TRIG, CHOLHDL, LDLDIRECT in the last 72 hours. Thyroid Function Tests: No results for input(s): TSH, T4TOTAL, FREET4, T3FREE, THYROIDAB in the last 72 hours. Anemia Panel: No results for input(s): VITAMINB12, FOLATE, FERRITIN, TIBC, IRON, RETICCTPCT in the last 72 hours. Urine analysis:    Component Value Date/Time   COLORURINE RED (A) 05/01/2019 2344   APPEARANCEUR CLOUDY (A) 05/01/2019 2344   LABSPEC 1.012 05/01/2019 2344   PHURINE 6.0 05/01/2019 2344   GLUCOSEU 50 (A) 05/01/2019 2344   HGBUR LARGE (A) 05/01/2019 2344   BILIRUBINUR NEGATIVE 05/01/2019 2344   [place] 05/01/2019 2344   PROTEINUR 100 (A) 05/01/2019 2344   NITRITE NEGATIVE 05/01/2019 2344   LEUKOCYTESUR NEGATIVE 05/01/2019 2344    Radiological Exams on Admission: CT Renal Stone Study  Result Date: 05/02/2019 CLINICAL DATA:  Left flank pain, lower back pain EXAM: CT ABDOMEN AND PELVIS WITHOUT CONTRAST TECHNIQUE: Multidetector CT imaging of the abdomen and pelvis was performed following the standard protocol without IV contrast. COMPARISON:  None. FINDINGS: Lower chest: Lung bases are clear. No effusions. Heart is normal size. Hepatobiliary: No focal hepatic abnormality. Gallbladder unremarkable. Pancreas: No focal abnormality or ductal dilatation. Spleen: No focal abnormality.  Normal size. Adrenals/Urinary Tract: Large solid mass is seen in the lower pole of the left kidney measuring 7.4 x 6.9 cm. Central calcifications noted. Findings concerning for renal cell carcinoma. There is mild left hydronephrosis  which may be related to mass effect from the mass on the adjacent proximal left ureter. High-density material is seen within the left renal collecting system and ureter compatible with blood. No hydronephrosis on the right. Adrenal glands are unremarkable. Bladder wall appears diffusely thickened. Stomach/Bowel: Normal appendix. Stomach, large and small bowel grossly unremarkable. Vascular/Lymphatic: No evidence of aneurysm or adenopathy. Reproductive: Prostate enlargement. Other: 2 No free fluid or free air. Musculoskeletal: No acute bony abnormality. IMPRESSION: Large solid mass involving the lower pole of the left kidney concerning for renal cell carcinoma measuring up to 7.4 cm. Mild left hydronephrosis, possibly related to mass effect or blood clot within the left ureter. Diffusely thick walled bladder, likely related to prostate enlargement. Electronically Signed   By: Rolm Baptise M.D.   On: 05/02/2019 01:24    EKG: Independently reviewed.   Assessment/Plan Principal Problem:   UTI (urinary tract infection) with hematuria   Possible sepsis (addendum)   Left renal mass, suspect renal cell carcinoma   Hydronephrosis of left kidney -CT abdomen showing large left renal  mass suspicious for carcinoma -Pain control, -- IV hydration with monitoring for fluid overload in view of heart failure history -IV Rocephin -Addendum-likely sepsis.  Since admission: Lactic acid 2.4 patient remains tachycardic, also tachypneic and with elevated white cell count.  Remains afebrile -Continue management as above -Urology consult.  Per Dr. Jeffie Pollock: Hold plavix and aspirin, keep n.p.o., consult Dr. Erlene Quan in am in case possible intervention is needed   Suspect AKI (acute kidney injury) ([place]) -IV hydration monitor renal function.  Creatinine 1.29 above baseline oxygen    CAD S/P percutaneous coronary angioplasty -History of PCI to LAD for 90% gnosis at [facility] Plavix 75 and Aspirin 81 per Dr. Jeffie Pollock  --continue, metoprolol succinate 100mg  1.5 tabs daily, pending med rec    dCHF (congestive heart failure) -Review of records from [facility], had systolic heart failure prior to his PCI -Continue Lasix 40, lisinopril 10, metoprolol succinate 100 mg 1-1/2 tablets daily and spironolactone 12.5 daily  DVT prophylaxis: SCDs Code Status: full code  Family Communication:  none  Disposition Plan: Back to previous home environment Consults called: urology , Dr Jeffie Pollock Status:obs    Athena Masse MD [facility]     05/02/2019, 3:08 AM

## 2019-05-02 NOTE — Progress Notes (Addendum)
PROGRESS NOTE    Mark Donaldson  GBT:[id number] DOB: [birth year] DOA: 05/02/2019 PCP: System, Pcp Not In    Chief Complaint  Patient presents with  . Flank Pain  . 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  UA showed significant RBC, WBC and bacteria.  CT renal study showed 7.4 cm left lower pole renal mass concerning for renal cell carcinoma and mild left hydronephrosis secondary to mass-effect versus blood clot within the left ureter.  Also had thick-walled bladder possibly secondary to enlarged prostate. Patient septic with tachycardia, tachypnea, uncontrolled hypertension and lactic acidosis and ATN.  Placed on empiric IV Rocephin, IV fluid bolus.  Culture sent and admitted to hospital service.  Assessment & Plan:   Principal Problem: Severe sepsis with acute target organ damage. Secondary to UTI. Continue IV hydration with close monitoring for fluid overload given history of CHF.  Empiric IV Rocephin.  Follow cultures.  Pain control as needed.   Active problems Left renal mass suspicious of RCC and hydronephrosis of left kidney. Urology consult appreciated and recommend nephrectomy (inpatient versus outpatient).  Needs to be off Plavix for at least 1 week.  Request cardiology consult for clearance.  Will obtain 2D echo.  Chronic diastolic CHF/CAD s/p LAD stent Last echo from [facility] shows EF of >55% with grade 1 diastolic dysfunction and degenerative mitral valve disease.  Follows with [facility] cardiology. Currently euvolemic.  Aspirin and  Plavix on hold.  Resume statin seen in the blocker.  Holding lisinopril and Aldactone due to AKI.  Acute tubular necrosis Likely prerenal with sepsis + mild left-sided hydronephrosis.  Monitor with fluids.  Avoid ACE inhibitor/Aldactone and nephrotoxins.  Uncontrolled hypertension  place on as needed IV hydralazine.  Resume metoprolol.  Gross hematuria Likely from [place].  Monitor H&H.  DVT prophylaxis: SCDs Code Status: Full code Family Communication: None Disposition: Home pending hospital course  Status is: Inpatient  The patient will require care spanning > 2 midnights and should be moved to inpatient because: Patient is septic with UTI (resolving symptoms), has acute tubular necrosis and left renal cell carcinoma with gross hematuria for which she may need inpatient surgery.  Patient needs ongoing IV antibiotic pending cultures, IV hydration and close monitoring of his H&H.  He is at high risk for persistent/worsening sepsis leading to septic shock and worsening renal failure.  Dispo: The patient is from: Home              Anticipated d/c is to: Home              Anticipated d/c date is: 2 days              Patient currently is not medically stable to d/c.       Consultants:   GI  Cardiology   Procedures: CT renal study, 2D echo   Antimicrobials: IV Rocephin   Subjective: Seen with the help of Spanish interpreter.  Reports left lower quadrant and flank pain.  No nausea or vomiting.  Objective: Vitals:   05/02/19 0430 05/02/19 0541 05/02/19 0813 05/02/19 1000  BP: (!) 166/103 (!) 168/108 (!) 168/102 (!) 151/93  Pulse: (!) 112 (!) 101 86 94  Resp: 17 16 16    Temp:  98.3 F (36.8 C) 98.3 F (36.8 C)   TempSrc:  Oral Oral   SpO2: 94% 95% 100%   Weight:  69.9 kg    Height:  5\' 5"  (1.651 m)      Intake/Output Summary (Last 24 hours) at 05/02/2019 1245 Last data filed at 05/02/2019 1000 Gross per 24 hour  Intake --  Output 500 ml  Net -500 ml   Filed  Weights   05/01/19 2336 05/02/19 0541  Weight: 71.7 kg 69.9 kg    Examination:  General: Not in distress HEENT: Moist mucosa, supple neck Chest: Clear CVs: Normal S1-S2 GI: Soft, nondistended, left lower quadrant and left CVA tenderness Musculoskeletal: Warm, no edema    Data Reviewed: I have personally reviewed following labs and imaging studies  CBC: Recent Labs  Lab 05/01/19 2344 05/02/19 0629  WBC 13.7* 11.1*  HGB 14.6 14.8  HCT 43.2 41.9  MCV 89.4 86.2  PLT 185 628    Basic Metabolic Panel: Recent Labs  Lab 05/01/19 2344  NA 130*  K 3.7  CL 96*  CO2 22  GLUCOSE 106*  BUN 17  CREATININE 1.29*  CALCIUM 8.5*    GFR: Estimated Creatinine Clearance: 55 mL/min (A) (by C-G formula based on SCr of 1.29 mg/dL (H)).  Liver Function Tests: No results for input(s): AST, ALT, ALKPHOS, BILITOT, PROT, ALBUMIN in the last 168 hours.  CBG: No results for input(s): GLUCAP in the last 168 hours.   Recent Results (from the past 240 hour(s))  Blood culture (routine x 2)     Status: None (Preliminary result)   Collection Time: 05/02/19  2:33 AM   Specimen: BLOOD  Result Value Ref Range Status   Specimen Description BLOOD LEFT WRIST  Final   Special Requests   Final    BOTTLES DRAWN AEROBIC AND ANAEROBIC Blood Culture adequate volume   Culture   Final    NO GROWTH < 12 HOURS Performed at [facility], [address] [place], [place] [address]    Report Status PENDING  Incomplete  Blood culture (routine x 2)     Status: None (Preliminary result)   Collection Time: 05/02/19  2:34 AM   Specimen: BLOOD  Result Value Ref Range Status   Specimen Description BLOOD LEFT AC  Final   Special Requests   Final    BOTTLES DRAWN AEROBIC AND ANAEROBIC Blood Culture adequate volume   Culture   Final    NO GROWTH < 12 HOURS Performed at [facility], [address], [place], [place] [address]    Report Status PENDING  Incomplete  Respiratory Panel by  RT PCR (Flu A&B, Covid) - Nasopharyngeal Swab     Status: None   Collection Time: 05/02/19  2:34 AM   Specimen: Nasopharyngeal Swab  Result Value Ref Range Status   SARS Coronavirus 2 by RT PCR NEGATIVE NEGATIVE Final    Comment: (NOTE) SARS-CoV-2 target nucleic acids are NOT DETECTED. The SARS-CoV-2 RNA is generally detectable in upper respiratoy specimens during the acute phase of infection. The lowest concentration of SARS-CoV-2 viral copies this assay can detect is 131 copies/mL. A negative result does not preclude SARS-Cov-2 infection and should not be used as the sole basis for treatment or other patient management decisions. A negative result may occur with  improper specimen collection/handling, submission of  specimen other than nasopharyngeal swab, presence of viral mutation(s) within the areas targeted by this assay, and inadequate number of viral copies (<131 copies/mL). A negative result must be combined with clinical observations, patient history, and epidemiological information. The expected result is Negative. Fact Sheet for Patients:  PinkCheek.be Fact Sheet for Healthcare Providers:  GravelBags.it This test is not yet ap proved or cleared by the [place] FDA and  has been authorized for detection and/or diagnosis of SARS-CoV-2 by FDA under an Emergency Use Authorization (EUA). This EUA will remain  in effect (meaning this test can be used) for the duration of the COVID-19 declaration under Section 564(b)(1) of the Act, 21 U.S.C. section 360bbb-3(b)(1), unless the authorization is terminated or revoked sooner.    Influenza A by PCR NEGATIVE NEGATIVE Final   Influenza B by PCR NEGATIVE NEGATIVE Final    Comment: (NOTE) The Xpert Xpress SARS-CoV-2/FLU/RSV assay is intended as an aid in  the diagnosis of influenza from Nasopharyngeal swab specimens and  should not be used as a sole basis for treatment.  Nasal washings and  aspirates are unacceptable for Xpert Xpress SARS-CoV-2/FLU/RSV  testing. Fact Sheet for Patients: PinkCheek.be Fact Sheet for Healthcare Providers: GravelBags.it This test is not yet approved or cleared by the [place] FDA and  has been authorized for detection and/or diagnosis of SARS-CoV-2 by  FDA under an Emergency Use Authorization (EUA). This EUA will remain  in effect (meaning this test can be used) for the duration of the  Covid-19 declaration under Section 564(b)(1) of the Act, 21  U.S.C. section 360bbb-3(b)(1), unless the authorization is  terminated or revoked. Performed at [facility], [address], [place], [place] [address]          Radiology Studies: CT Renal Stone Study  Result Date: 05/02/2019 CLINICAL DATA:  Left flank pain, lower back pain EXAM: CT ABDOMEN AND PELVIS WITHOUT CONTRAST TECHNIQUE: Multidetector CT imaging of the abdomen and pelvis was performed following the standard protocol without IV contrast. COMPARISON:  None. FINDINGS: Lower chest: Lung bases are clear. No effusions. Heart is normal size. Hepatobiliary: No focal hepatic abnormality. Gallbladder unremarkable. Pancreas: No focal abnormality or ductal dilatation. Spleen: No focal abnormality.  Normal size. Adrenals/Urinary Tract: Large solid mass is seen in the lower pole of the left kidney measuring 7.4 x 6.9 cm. Central calcifications noted. Findings concerning for renal cell carcinoma. There is mild left hydronephrosis which may be related to mass effect from the mass on the adjacent proximal left ureter. High-density material is seen within the left renal collecting system and ureter compatible with blood. No hydronephrosis on the right. Adrenal glands are unremarkable. Bladder wall appears diffusely thickened. Stomach/Bowel: Normal appendix. Stomach, large and small bowel grossly unremarkable.  Vascular/Lymphatic: No evidence of aneurysm or adenopathy. Reproductive: Prostate enlargement. Other: 2 No free fluid or free air. Musculoskeletal: No acute bony abnormality. IMPRESSION: Large solid mass involving the lower pole of the left kidney concerning for renal cell carcinoma measuring up to 7.4 cm. Mild left hydronephrosis, possibly related to mass effect or blood clot within the left ureter. Diffusely thick walled bladder, likely related to prostate enlargement. Electronically Signed   By: Rolm Baptise M.D.   On: 05/02/2019 01:24        Scheduled Meds: Continuous Infusions: . sodium chloride 125 mL/hr at 05/02/19 0850  . [START ON 05/03/2019] cefTRIAXone (ROCEPHIN)  IV       LOS: 0 days    Time spent: 25 minutes  Louellen Molder, MD [facility]   To contact the attending provider between 7A-7P or the covering provider during after hours 7P-7A, please log into the web site [email or web address] and access using universal [place] password for that web site. If you do not have the password, please call the hospital operator.  05/02/2019, 12:45 PM

## 2019-05-02 NOTE — Consult Note (Signed)
Urology Consult  I have been asked to see the patient by Dr. Damita Dunnings, for evaluation and management of left renal mass with left hydronephrosis and concern for urosepsis.  Chief Complaint: Left flank pain, gross hematuria  History of Present Illness: Mark Donaldson is a 59 y.o. year old male with PMH CAD s/p cardiac stent and systolic heart failure who presented to the ED overnight with reports of a 1 day history of left flank pain and gross hematuria.  UA notable for >50 RBCs/hpf, 6-10 WBCs/hpf, and many bacteria.  He underwent CT stone study, which revealed a 7.4 cm left lower pole renal mass concerning for RCC with mild left hydronephrosis secondary to mass-effect versus blood clot within the left ureter as well as a thick-walled bladder likely secondary to prostatomegaly.  Lactate normalized to 1.0 this morning, down from 2.4 at admission.  WBC count downtrending, 11.1 this morning.  Creatinine 1.29 on admission.  Urine culture pending, blood culture pending with no growth at <12 hours. On antibiotics as below.  Interview today completed with the aid of a Spanish interpreter.  Patient reports a 1 month history of mild left flank pain, which she assumed was musculoskeletal in origin.  He denies a personal or family history of kidney, bladder, prostate, and testicular cancer.  He denies gross hematuria prior to yesterday.  He denies unintentional weight loss.  Per chart review, patient has a baseline creatinine around 1.25.  He was previously a patient of [facility], but discontinued medications and care around 2016.  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  No care notes available from this time.  Today, patient reports his left flank pain  has improved since his overnight admission.  Overall, he is comfortable appearing with no acute concerns.  He has been n.p.o. since yesterday.  He believes his last dose of Plavix was 3 days ago.  Anti-infectives (From admission, onward)   Start     Dose/Rate Route Frequency Ordered Stop   05/03/19 0300  cefTRIAXone (ROCEPHIN) 1 g in sodium chloride 0.9 % 100 mL IVPB     1 g 200 mL/hr over 30 Minutes Intravenous Every 24 hours 05/02/19 0307     05/02/19 0230  cefTRIAXone (ROCEPHIN) 1 g in sodium chloride 0.9 % 100 mL IVPB     1 g 200 mL/hr over 30 Minutes Intravenous  Once 05/02/19 0225 05/02/19 0406      Past Medical History:  Diagnosis Date  . Renal disorder     Past Surgical History:  Procedure Laterality Date  . CARDIAC SURGERY     cardiac cath with 2 stent placement    Home Medications:  Current Meds  Medication Sig  . aspirin 81 MG chewable tablet Chew 1 tablet by mouth daily.  Marland Kitchen atorvastatin (LIPITOR) 80 MG tablet Take 1 tablet by mouth at bedtime.  . clopidogrel (PLAVIX) 75 MG tablet Take 1 tablet by mouth daily.  . furosemide (LASIX) 40 MG tablet Take 1 tablet by mouth daily.  Marland Kitchen lisinopril (ZESTRIL) 10 MG tablet Take 1 tablet by mouth daily.  . metoprolol succinate (TOPROL-XL) 100 MG 24 hr tablet Take 1.5 tablets by mouth daily.  Marland Kitchen spironolactone (ALDACTONE) 25 MG tablet Take 12.5 mg by mouth daily.    Allergies: No Known Allergies  No family  history on file.  Social History:  reports that he has never smoked. He has never used smokeless tobacco. He reports current alcohol use. No history on file for drug.  ROS: A complete review of systems was performed.  All systems are negative except for pertinent findings as noted.  Physical Exam:  Vital signs in last 24 hours: Temp:  [98.3 F (36.8 C)] 98.3 F (36.8 C) (04/19 0813) Pulse Rate:  [86-112] 86 (04/19 0813) Resp:  [10-18] 16 (04/19 0813) BP: (160-168)/(88-108) 168/102 (04/19 0813) SpO2:  [94 %-100 %] 100  % (04/19 0813) Weight:  [69.9 kg-71.7 kg] 69.9 kg (04/19 0541) Constitutional:  Alert and oriented, no acute distress HEENT: [place] AT, moist mucus membranes Cardiovascular: No clubbing, cyanosis, or edema. Respiratory: Normal respiratory effort GU: No CVA tenderness Skin: No rashes, bruises or suspicious lesions Neurologic: Grossly intact, no focal deficits, moving all 4 extremities Psychiatric: Normal mood and affect  Laboratory Data:  Recent Labs    05/01/19 2344 05/02/19 0629  WBC 13.7* 11.1*  HGB 14.6 14.8  HCT 43.2 41.9   Recent Labs    05/01/19 2344  NA 130*  K 3.7  CL 96*  CO2 22  GLUCOSE 106*  BUN 17  CREATININE 1.29*  CALCIUM 8.5*   Recent Labs    05/02/19 0629  INR 0.9   Urinalysis    Component Value Date/Time   COLORURINE RED (A) 05/01/2019 2344   APPEARANCEUR CLOUDY (A) 05/01/2019 2344   LABSPEC 1.012 05/01/2019 2344   PHURINE 6.0 05/01/2019 2344   GLUCOSEU 50 (A) 05/01/2019 2344   HGBUR LARGE (A) 05/01/2019 2344   [place] NEGATIVE 05/01/2019 2344   [place] 05/01/2019 2344   PROTEINUR 100 (A) 05/01/2019 2344   NITRITE NEGATIVE 05/01/2019 2344   LEUKOCYTESUR NEGATIVE 05/01/2019 2344   Results for orders placed or performed during the hospital encounter of 05/02/19  Blood culture (routine x 2)     Status: None (Preliminary result)   Collection Time: 05/02/19  2:33 AM   Specimen: BLOOD  Result Value Ref Range Status   Specimen Description BLOOD LEFT WRIST  Final   Special Requests   Final    BOTTLES DRAWN AEROBIC AND ANAEROBIC Blood Culture adequate volume   Culture   Final    NO GROWTH < 12 HOURS Performed at [facility], [address] [place], [place] [address]    Report Status PENDING  Incomplete  Blood culture (routine x 2)     Status: None (Preliminary result)   Collection Time: 05/02/19  2:34 AM   Specimen: BLOOD  Result Value Ref Range Status   Specimen Description BLOOD LEFT AC  Final   Special Requests    Final    BOTTLES DRAWN AEROBIC AND ANAEROBIC Blood Culture adequate volume   Culture   Final    NO GROWTH < 12 HOURS Performed at [facility], [address] [place], [place] [address]    Report Status PENDING  Incomplete  Respiratory Panel by RT PCR (Flu A&B, Covid) - Nasopharyngeal Swab     Status: None   Collection Time: 05/02/19  2:34 AM   Specimen: Nasopharyngeal Swab  Result Value Ref Range Status   SARS Coronavirus 2 by RT PCR NEGATIVE NEGATIVE Final    Comment: (NOTE) SARS-CoV-2 target nucleic acids are NOT DETECTED. The SARS-CoV-2 RNA is generally detectable in upper respiratoy specimens during the acute phase of infection. The lowest concentration of SARS-CoV-2 viral copies this assay can detect is  131 copies/mL. A negative result does not preclude SARS-Cov-2 infection and should not be used as the sole basis for treatment or other patient management decisions. A negative result may occur with  improper specimen collection/handling, submission of specimen other than nasopharyngeal swab, presence of viral mutation(s) within the areas targeted by this assay, and inadequate number of viral copies (<131 copies/mL). A negative result must be combined with clinical observations, patient history, and epidemiological information. The expected result is Negative. Fact Sheet for Patients:  PinkCheek.be Fact Sheet for Healthcare Providers:  GravelBags.it This test is not yet ap proved or cleared by the [place] FDA and  has been authorized for detection and/or diagnosis of SARS-CoV-2 by FDA under an Emergency Use Authorization (EUA). This EUA will remain  in effect (meaning this test can be used) for the duration of the COVID-19 declaration under Section 564(b)(1) of the Act, 21 U.S.C. section 360bbb-3(b)(1), unless the authorization is terminated or revoked sooner.    Influenza A by PCR NEGATIVE NEGATIVE  Final   Influenza B by PCR NEGATIVE NEGATIVE Final    Comment: (NOTE) The Xpert Xpress SARS-CoV-2/FLU/RSV assay is intended as an aid in  the diagnosis of influenza from Nasopharyngeal swab specimens and  should not be used as a sole basis for treatment. Nasal washings and  aspirates are unacceptable for Xpert Xpress SARS-CoV-2/FLU/RSV  testing. Fact Sheet for Patients: PinkCheek.be Fact Sheet for Healthcare Providers: GravelBags.it This test is not yet approved or cleared by the [place] FDA and  has been authorized for detection and/or diagnosis of SARS-CoV-2 by  FDA under an Emergency Use Authorization (EUA). This EUA will remain  in effect (meaning this test can be used) for the duration of the  Covid-19 declaration under Section 564(b)(1) of the Act, 21  U.S.C. section 360bbb-3(b)(1), unless the authorization is  terminated or revoked. Performed at [facility], [address] [place], [place] [address]    Radiologic Imaging: CT Renal Stone Study  Result Date: 05/02/2019 CLINICAL DATA:  Left flank pain, lower back pain EXAM: CT ABDOMEN AND PELVIS WITHOUT CONTRAST TECHNIQUE: Multidetector CT imaging of the abdomen and pelvis was performed following the standard protocol without IV contrast. COMPARISON:  None. FINDINGS: Lower chest: Lung bases are clear. No effusions. Heart is normal size. Hepatobiliary: No focal hepatic abnormality. Gallbladder unremarkable. Pancreas: No focal abnormality or ductal dilatation. Spleen: No focal abnormality.  Normal size. Adrenals/Urinary Tract: Large solid mass is seen in the lower pole of the left kidney measuring 7.4 x 6.9 cm. Central calcifications noted. Findings concerning for renal cell carcinoma. There is mild left hydronephrosis which may be related to mass effect from the mass on the adjacent proximal left ureter. High-density material is seen within the left renal  collecting system and ureter compatible with blood. No hydronephrosis on the right. Adrenal glands are unremarkable. Bladder wall appears diffusely thickened. Stomach/Bowel: Normal appendix. Stomach, large and small bowel grossly unremarkable. Vascular/Lymphatic: No evidence of aneurysm or adenopathy. Reproductive: Prostate enlargement. Other: 2 No free fluid or free air. Musculoskeletal: No acute bony abnormality. IMPRESSION: Large solid mass involving the lower pole of the left kidney concerning for renal cell carcinoma measuring up to 7.4 cm. Mild left hydronephrosis, possibly related to mass effect or blood clot within the left ureter. Diffusely thick walled bladder, likely related to prostate enlargement. Electronically Signed   By: Rolm Baptise M.D.   On: 05/02/2019 01:24   I personally reviewed the imaging above and note the presence  of a large left lower pole renal mass with mild left hydronephrosis and a thick-walled bladder with an enlarged prostate.  Assessment & Plan:  59 year old male with a history of cardiac comorbidities and ANCA vasculitis presents overnight with sudden onset gross hematuria and left flank pain, with findings of likely large left lower pole RCC with associated mild left hydronephrosis due to mass-effect versus blood clot within the ureter and likely UTI.  Pain well controlled overnight, no acute distress this a.m.  I had a lengthy conversation with the patient today regarding his CT findings.  I explained that his left renal mass is consistent with a kidney cancer and that these cancers are typically managed surgically.  Patient will require left nephrectomy for management of his left renal mass.  Recommend cardiac clearance in advance of this; patient will also need to be off Plavix for at least 1 week in advance of surgery. May proceed with nephrectomy during his current admission or as an outpatient, depending on his length of stay.  He will require close nephrology  follow-up thereafter due to his history of vasculitis.  As patient is well-appearing this morning, will defer ureteral stent placement for management of hydronephrosis unless he acutely decompensates or his left flank pain acutely worsens.  Recommendations: -Advance diet -Continue to hold Plavix -Consider cardiology consult for cardiac clearance in advance of recommended nephrectomy -Agree with empiric antibiotics, continue to follow urine cultures  Thank you for involving me in this patient's care, I will continue to follow along.  Debroah Loop, PA-C 05/02/2019 8:56 AM

## 2019-05-03 ENCOUNTER — Inpatient Hospital Stay: Payer: Self-pay

## 2019-05-03 ENCOUNTER — Encounter: Payer: Self-pay | Admitting: Internal Medicine

## 2019-05-03 DIAGNOSIS — N170 Acute kidney failure with tubular necrosis: Secondary | ICD-10-CM

## 2019-05-03 DIAGNOSIS — R590 Localized enlarged lymph nodes: Secondary | ICD-10-CM

## 2019-05-03 DIAGNOSIS — A419 Sepsis, unspecified organism: Principal | ICD-10-CM

## 2019-05-03 DIAGNOSIS — N2889 Other specified disorders of kidney and ureter: Secondary | ICD-10-CM

## 2019-05-03 DIAGNOSIS — R918 Other nonspecific abnormal finding of lung field: Secondary | ICD-10-CM

## 2019-05-03 DIAGNOSIS — N3001 Acute cystitis with hematuria: Secondary | ICD-10-CM

## 2019-05-03 DIAGNOSIS — R911 Solitary pulmonary nodule: Secondary | ICD-10-CM

## 2019-05-03 DIAGNOSIS — R6520 Severe sepsis without septic shock: Secondary | ICD-10-CM

## 2019-05-03 LAB — CBC
HCT: 43.1 % (ref 39.0–52.0)
Hemoglobin: 14.5 g/dL (ref 13.0–17.0)
MCH: 30.3 pg (ref 26.0–34.0)
MCHC: 33.6 g/dL (ref 30.0–36.0)
MCV: 90.2 fL (ref 80.0–100.0)
Platelets: 175 10*3/uL (ref 150–400)
RBC: 4.78 MIL/uL (ref 4.22–5.81)
RDW: 13.7 % (ref 11.5–15.5)
WBC: 10.4 10*3/uL (ref 4.0–10.5)
nRBC: 0 % (ref 0.0–0.2)

## 2019-05-03 LAB — BASIC METABOLIC PANEL
Anion gap: 5 (ref 5–15)
BUN: 15 mg/dL (ref 6–20)
CO2: 27 mmol/L (ref 22–32)
Calcium: 8.3 mg/dL — ABNORMAL LOW (ref 8.9–10.3)
Chloride: 107 mmol/L (ref 98–111)
Creatinine, Ser: 1.08 mg/dL (ref 0.61–1.24)
GFR calc Af Amer: >60 mL/min (ref >60)
GFR calc non Af Amer: >60 mL/min (ref >60)
Glucose, Bld: 99 mg/dL (ref 70–99)
Potassium: 4.3 mmol/L (ref 3.5–5.1)
Sodium: 139 mmol/L (ref 135–145)

## 2019-05-03 LAB — URINE CULTURE

## 2019-05-03 LAB — HEPATIC FUNCTION PANEL
ALT: 20 U/L (ref 0–44)
AST: 28 U/L (ref 15–41)
Albumin: 3 g/dL — ABNORMAL LOW (ref 3.5–5.0)
Alkaline Phosphatase: 72 U/L (ref 38–126)
Bilirubin, Direct: 0.2 mg/dL (ref 0.0–0.2)
Indirect Bilirubin: 0.7 mg/dL (ref 0.3–0.9)
Total Bilirubin: 0.9 mg/dL (ref 0.3–1.2)
Total Protein: 6.2 g/dL — ABNORMAL LOW (ref 6.5–8.1)

## 2019-05-03 MED ORDER — IOHEXOL 300 MG/ML  SOLN
100.0000 mL | Freq: Once | INTRAMUSCULAR | Status: AC | PRN
  Administered 2019-05-03: 11:00:00 100 mL via INTRAVENOUS

## 2019-05-03 MED ORDER — SODIUM CHLORIDE 0.9 % IV SOLN
INTRAVENOUS | Status: DC | PRN
  Administered 2019-05-03 – 2019-05-05 (×3): 250 mL via INTRAVENOUS

## 2019-05-03 NOTE — Consult Note (Signed)
Hematology/Oncology Consult note [facility] Telephone:([phone] [phone]  Patient Care Team: System, Pcp Not In as PCP - General   Name of the patient: Mark Donaldson  [id number]  [birth year]   Date of visit: 05/03/19 REASON FOR COSULTATION:  Renal mass History of presenting illness-  59 y.o. male with PMH listed at below who presents to ER for evaluation of flank pain and blood in urine.  Patient has a history of CAD status post stent placement approximately 2 years ago, CHF with preserved EF. CT chest abdomen pelvis with contrast showed 7 cm heterogeneous mass lesion in the lower pole of left kidney is compatible with RCC.  Mass generates mass-effect on the left renal pelvis and proximal left ureter.  Left renal vein patent.  Necrotic lymphadenopathy in the mediastinum in the right suprahilar region which is highly suspicious for metastatic disease.Bilateral pulmonary nodules also raise concern for metastatic disease. Right retrocrural lymphadenopathy with upper normal left periaortic lymph node also suggestive for metastatic involvement. Patient is on aspirin and Plavix.  Currently aspirin and the Plavix have been held given hematuria and plan for cytoreductive nephrectomy per urology.  Heme-onc was consulted for renal mass evaluation and to see if patient needs to have simultaneous lung/mediastinal lymphadenopathy biopsy to be done along with his nephrectomy. Patient reports feeling well today.  Flank pain has improved.  He speaks Spanish and online interpreter service was used for the entire encounter. He denies any shortness of breath, breathing difficulties, chest pain today.  Yesterday he felt some chest pressure.   Review of Systems  Constitutional: Negative for appetite change, chills, fatigue, fever and unexpected weight change.  HENT:   Negative for hearing loss and voice change.   Eyes: Negative for eye problems and icterus.    Respiratory: Negative for chest tightness, cough and shortness of breath.   Cardiovascular: Negative for chest pain and leg swelling.  Gastrointestinal: Negative for abdominal distention and abdominal pain.  Endocrine: Negative for hot flashes.  Genitourinary: Positive for hematuria. Negative for difficulty urinating, dysuria and frequency.   Musculoskeletal: Negative for arthralgias.  Skin: Negative for itching and rash.  Neurological: Negative for light-headedness and numbness.  Hematological: Negative for adenopathy. Does not bruise/bleed easily.  Psychiatric/Behavioral: Negative for confusion.    No Known Allergies  Patient Active Problem List   Diagnosis Date Noted  . CAD S/P percutaneous coronary angioplasty 05/02/2019  . Left renal mass 05/02/2019  . Hydronephrosis of left kidney 05/02/2019  . UTI (urinary tract infection) 05/02/2019  . AKI (acute kidney injury) ([place]) 05/02/2019  . Gross hematuria 05/02/2019  . Chronic diastolic CHF (congestive heart failure) ([place]) 05/02/2019  . Sepsis ([place]) 05/02/2019  . Severe sepsis ([place]) 05/02/2019  . Preop cardiovascular exam      Past Medical History:  Diagnosis Date  . 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 CAD (coronary artery disease)   . Essential hypertension   . HFrEF (heart failure with reduced ejection fraction) ([place])   . Hyperlipidemia LDL goal <70   . Ischemic cardiomyopathy   . Renal disorder      Past Surgical History:  Procedure Laterality Date  . CARDIAC SURGERY     cardiac cath with 2 stent placement    Social History   Socioeconomic History  . Marital status: Married    Spouse name: Not on file  . Number of children: Not on file  . Years of education: Not on file  . Highest education level: Not  on file  Occupational History  . Not on file  Tobacco Use  . Smoking status: Never Smoker  . Smokeless tobacco: Never Used  Substance and Sexual Activity  . Alcohol use: Yes  . Drug use: Not on file  .  Sexual activity: Not on file  Other Topics Concern  . Not on file  Social History Narrative  . Not on file   Social Determinants of Health   Financial Resource Strain:   . Difficulty of Paying Living Expenses:   Food Insecurity:   . Worried About Charity fundraiser in the Last Year:   . Arboriculturist in the Last Year:   Transportation Needs:   . Film/video editor (Medical):   Marland Kitchen Lack of Transportation (Non-Medical):   Physical Activity:   . Days of Exercise per Week:   . Minutes of Exercise per Session:   Stress:   . Feeling of Stress :   Social Connections:   . Frequency of Communication with Friends and Family:   . Frequency of Social Gatherings with Friends and Family:   . Attends Religious Services:   . Active Member of Clubs or Organizations:   . Attends Archivist Meetings:   Marland Kitchen Marital Status:   Intimate Partner Violence:   . Fear of Current or Ex-Partner:   . Emotionally Abused:   Marland Kitchen Physically Abused:   . Sexually Abused:      Family History  Problem Relation Age of Onset  . Cancer Mother   . Blindness Brother      Current Facility-Administered Medications:  .  0.9 %  sodium chloride infusion, , Intravenous, Continuous, Athena Masse, MD, Last Rate: 125 mL/hr at 05/03/19 1209, New Bag at 05/03/19 1209 .  0.9 %  sodium chloride infusion, , Intravenous, PRN, Dhungel, Nishant, MD, Stopped at 05/03/19 0325 .  acetaminophen (TYLENOL) tablet 650 mg, 650 mg, Oral, Q6H PRN, 650 mg at 05/02/19 2237 **OR** acetaminophen (TYLENOL) suppository 650 mg, 650 mg, Rectal, Q6H PRN, Athena Masse, MD .  atorvastatin (LIPITOR) tablet 80 mg, 80 mg, Oral, QHS, Dhungel, Nishant, MD, 80 mg at 05/02/19 2238 .  cefTRIAXone (ROCEPHIN) 1 g in sodium chloride 0.9 % 100 mL IVPB, 1 g, Intravenous, Q24H, Judd Gaudier V, MD, Last Rate: 200 mL/hr at 05/03/19 0334, Rate Verify at 05/03/19 0334 .  hydrALAZINE (APRESOLINE) injection 10 mg, 10 mg, Intravenous, Q6H PRN, Dhungel,  Nishant, MD, 10 mg at 05/02/19 0849 .  metoprolol succinate (TOPROL-XL) 24 hr tablet 150 mg, 150 mg, Oral, Daily, Dhungel, Nishant, MD, 150 mg at 05/03/19 0905 .  morphine 4 MG/ML injection 4 mg, 4 mg, Intravenous, Q2H PRN, Athena Masse, MD, 4 mg at 05/02/19 0453 .  ondansetron (ZOFRAN) tablet 4 mg, 4 mg, Oral, Q6H PRN **OR** ondansetron (ZOFRAN) injection 4 mg, 4 mg, Intravenous, Q6H PRN, Athena Masse, MD   Physical exam:  Vitals:   05/02/19 2108 05/03/19 0502 05/03/19 0905 05/03/19 1247  BP: 136/90 (!) 148/88 (!) 147/96 131/89  Pulse: 73 73 67 72  Resp: 20 20  15   Temp: 98.9 F (37.2 C) 98.3 F (36.8 C)  98 F (36.7 C)  TempSrc: Oral Oral  Oral  SpO2: 100% 99%  98%  Weight:      Height:       Physical Exam  Constitutional: He is oriented to person, place, and time. No distress.  HENT:  Head: Normocephalic and atraumatic.  Nose: Nose normal.  Mouth/Throat: Oropharynx is clear and moist. No oropharyngeal exudate.  Eyes: Pupils are equal, round, and reactive to light. EOM are normal. No scleral icterus.  Cardiovascular: Normal rate and regular rhythm.  No murmur heard. Pulmonary/Chest: Effort normal. No respiratory distress.  Abdominal: Soft. He exhibits no distension. There is no abdominal tenderness.  Musculoskeletal:        General: No edema. Normal range of motion.     Cervical back: Normal range of motion and neck supple.  Neurological: He is alert and oriented to person, place, and time. No cranial nerve deficit.  Skin: Skin is warm and dry. He is not diaphoretic. No erythema.  Psychiatric: Affect normal.        CMP Latest Ref Rng & Units 05/03/2019  Glucose 70 - 99 mg/dL 99  BUN 6 - 20 mg/dL 15  Creatinine 0.61 - 1.24 mg/dL 1.08  Sodium 135 - 145 mmol/L 139  Potassium 3.5 - 5.1 mmol/L 4.3  Chloride 98 - 111 mmol/L 107  CO2 22 - 32 mmol/L 27  Calcium 8.9 - 10.3 mg/dL 8.3(L)  Total Protein 6.5 - 8.1 g/dL 6.2(L)  Total Bilirubin 0.3 - 1.2 mg/dL 0.9    Alkaline Phos 38 - 126 U/L 72  AST 15 - 41 U/L 28  ALT 0 - 44 U/L 20   CBC Latest Ref Rng & Units 05/03/2019  WBC 4.0 - 10.5 K/uL 10.4  Hemoglobin 13.0 - 17.0 g/dL 14.5  Hematocrit 39.0 - 52.0 % 43.1  Platelets 150 - 400 K/uL 175    RADIOGRAPHIC STUDIES: I have personally reviewed the radiological images as listed and agreed with the findings in the report. CT ABDOMEN PELVIS W WO CONTRAST  Result Date: 05/03/2019 CLINICAL DATA:  Renal mass. EXAM: CT CHEST WITH CONTRAST CT ABDOMEN AND PELVIS WITH AND WITHOUT CONTRAST TECHNIQUE: Multidetector CT imaging of the chest was performed during intravenous contrast administration. Multidetector CT imaging of the abdomen and pelvis was performed following the standard protocol before and during bolus administration of intravenous contrast. CONTRAST:  148mL OMNIPAQUE IOHEXOL 300 MG/ML  SOLN COMPARISON:  05/02/2019 CT stone study. FINDINGS: CT CHEST FINDINGS Cardiovascular: The heart size is normal. No substantial pericardial effusion. Coronary artery calcification is evident. No thoracic aortic aneurysm. Mediastinum/Nodes: Necrotic lymph nodes are identified in the right mediastinum along the pleural surface and right paratracheal space. Nodal conglomeration to the right of the trachea measures 3.5 x 1.9 cm on 22/3. No left hilar lymphadenopathy. The esophagus has normal imaging features. There is no axillary lymphadenopathy. 15 mm short axis right retrocrural node is visible on 34/18. Lungs/Pleura: 1.7 cm perifissural nodule noted on the minor fissure (82/5). 8 mm posterior left lower lobe nodule seen on 78/5. 5 mm medial right upper lobe nodule visible on 57/5. Dependent atelectasis noted in both lower lobes. Tiny bilateral pleural effusions evident. Musculoskeletal: No worrisome lytic or sclerotic osseous abnormality. CT ABDOMEN AND PELVIS FINDINGS Hepatobiliary: No suspicious focal abnormality within the liver parenchyma. There is no evidence for gallstones,  gallbladder wall thickening, or pericholecystic fluid. No intrahepatic or extrahepatic biliary dilation. Pancreas: No focal mass lesion. No dilatation of the main duct. No intraparenchymal cyst. No peripancreatic edema. Spleen: No splenomegaly. No focal mass lesion. Adrenals/Urinary Tract: No adrenal nodule or mass. Right kidney unremarkable. 6.4 x 6.7 x 7.0 cm heterogeneously enhancing mass is identified in the lower pole left kidney. Lesion extends into the central sinus fat and displaces the renal pelvis and proximal ureter posteriorly. Left renal vein is  patent. No evidence for hydroureter. The urinary bladder appears normal for the degree of distention. Stomach/Bowel: Stomach is unremarkable. No gastric wall thickening. No evidence of outlet obstruction. Duodenum is normally positioned as is the ligament of Treitz. No small bowel wall thickening. No small bowel dilatation. The terminal ileum is normal. The appendix is normal. No gross colonic mass. No colonic wall thickening. Vascular/Lymphatic: No abdominal aortic aneurysm. 9 mm left para-aortic node on 66/18 is not enlarged by CT criteria but is suspicious for metastatic disease. No pelvic sidewall lymphadenopathy. Reproductive: The prostate gland and seminal vesicles are unremarkable. Other: No intraperitoneal free fluid. Musculoskeletal: No worrisome lytic or sclerotic osseous abnormality. 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 IMPRESSION: 1. 7 cm heterogeneous mass lesion in the lower pole left kidney is compatible with renal cell carcinoma. Mass generates mass-effect on the left renal pelvis and proximal left ureter. Left renal vein patent. 2. Necrotic lymphadenopathy in the mediastinum and right suprahilar region is highly suspicious for metastatic disease. Bilateral pulmonary nodules also raise concern for metastatic involvement. 3. Right retrocrural lymphadenopathy with upper normal left para-aortic lymph  node also suggest metastatic involvement. 4. 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 Electronically Signed   By: Misty Stanley M.D.   On: 05/03/2019 13:35   CT CHEST W CONTRAST  Result Date: 05/03/2019 CLINICAL DATA:  Renal mass. EXAM: CT CHEST WITH CONTRAST CT ABDOMEN AND PELVIS WITH AND WITHOUT CONTRAST TECHNIQUE: Multidetector CT imaging of the chest was performed during intravenous contrast administration. Multidetector CT imaging of the abdomen and pelvis was performed following the standard protocol before and during bolus administration of intravenous contrast. CONTRAST:  190mL OMNIPAQUE IOHEXOL 300 MG/ML  SOLN COMPARISON:  05/02/2019 CT stone study. FINDINGS: CT CHEST FINDINGS Cardiovascular: The heart size is normal. No substantial pericardial effusion. Coronary artery calcification is evident. No thoracic aortic aneurysm. Mediastinum/Nodes: Necrotic lymph nodes are identified in the right mediastinum along the pleural surface and right paratracheal space. Nodal conglomeration to the right of the trachea measures 3.5 x 1.9 cm on 22/3. No left hilar lymphadenopathy. The esophagus has normal imaging features. There is no axillary lymphadenopathy. 15 mm short axis right retrocrural node is visible on 34/18. Lungs/Pleura: 1.7 cm perifissural nodule noted on the minor fissure (82/5). 8 mm posterior left lower lobe nodule seen on 78/5. 5 mm medial right upper lobe nodule visible on 57/5. Dependent atelectasis noted in both lower lobes. Tiny bilateral pleural effusions evident. Musculoskeletal: No worrisome lytic or sclerotic osseous abnormality. CT ABDOMEN AND PELVIS FINDINGS Hepatobiliary: No suspicious focal abnormality within the liver parenchyma. There is no evidence for gallstones, gallbladder wall thickening, or pericholecystic fluid. No intrahepatic or extrahepatic biliary dilation. Pancreas: No focal mass lesion. No dilatation of the main duct. No intraparenchymal cyst. No  peripancreatic edema. Spleen: No splenomegaly. No focal mass lesion. Adrenals/Urinary Tract: No adrenal nodule or mass. Right kidney unremarkable. 6.4 x 6.7 x 7.0 cm heterogeneously enhancing mass is identified in the lower pole left kidney. Lesion extends into the central sinus fat and displaces the renal pelvis and proximal ureter posteriorly. Left renal vein is patent. No evidence for hydroureter. The urinary bladder appears normal for the degree of distention. Stomach/Bowel: Stomach is unremarkable. No gastric wall thickening. No evidence of outlet obstruction. Duodenum is normally positioned as is the ligament of Treitz. No small bowel wall thickening. No small bowel dilatation. The terminal ileum is normal. The appendix is normal. No gross colonic  mass. No colonic wall thickening. Vascular/Lymphatic: No abdominal aortic aneurysm. 9 mm left para-aortic node on 66/18 is not enlarged by CT criteria but is suspicious for metastatic disease. No pelvic sidewall lymphadenopathy. Reproductive: The prostate gland and seminal vesicles are unremarkable. Other: No intraperitoneal free fluid. Musculoskeletal: No worrisome lytic or sclerotic osseous abnormality. 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 IMPRESSION: 1. 7 cm heterogeneous mass lesion in the lower pole left kidney is compatible with renal cell carcinoma. Mass generates mass-effect on the left renal pelvis and proximal left ureter. Left renal vein patent. 2. Necrotic lymphadenopathy in the mediastinum and right suprahilar region is highly suspicious for metastatic disease. Bilateral pulmonary nodules also raise concern for metastatic involvement. 3. Right retrocrural lymphadenopathy with upper normal left para-aortic lymph node also suggest metastatic involvement. 4. 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 Electronically Signed   By: Misty Stanley M.D.   On: 05/03/2019 13:35   CT Renal Stone  Study  Result Date: 05/02/2019 CLINICAL DATA:  Left flank pain, lower back pain EXAM: CT ABDOMEN AND PELVIS WITHOUT CONTRAST TECHNIQUE: Multidetector CT imaging of the abdomen and pelvis was performed following the standard protocol without IV contrast. COMPARISON:  None. FINDINGS: Lower chest: Lung bases are clear. No effusions. Heart is normal size. Hepatobiliary: No focal hepatic abnormality. Gallbladder unremarkable. Pancreas: No focal abnormality or ductal dilatation. Spleen: No focal abnormality.  Normal size. Adrenals/Urinary Tract: Large solid mass is seen in the lower pole of the left kidney measuring 7.4 x 6.9 cm. Central calcifications noted. Findings concerning for renal cell carcinoma. There is mild left hydronephrosis which may be related to mass effect from the mass on the adjacent proximal left ureter. High-density material is seen within the left renal collecting system and ureter compatible with blood. No hydronephrosis on the right. Adrenal glands are unremarkable. Bladder wall appears diffusely thickened. Stomach/Bowel: Normal appendix. Stomach, large and small bowel grossly unremarkable. Vascular/Lymphatic: No evidence of aneurysm or adenopathy. Reproductive: Prostate enlargement. Other: 2 No free fluid or free air. Musculoskeletal: No acute bony abnormality. IMPRESSION: Large solid mass involving the lower pole of the left kidney concerning for renal cell carcinoma measuring up to 7.4 cm. Mild left hydronephrosis, possibly related to mass effect or blood clot within the left ureter. Diffusely thick walled bladder, likely related to prostate enlargement. Electronically Signed   By: Rolm Baptise M.D.   On: 05/02/2019 01:24    Assessment and plan- Patient is a 59 y.o. 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  CT showed  6.4 x 6.7 x 7 cm left kidney mass, retroperitoneal lymphadenopathy as well as necrotic lymphadenopathy in the mediastinum and the right suprahilar region, bilateral lung nodules.  #Hematuria with new findings of left kidney mass  Suspected RCC.   CT staging showed retroperitoneal lymphadenopathy as well as necrotic lymphadenopathy in the mediastinum and right suprahilar region as well as bilateral lung nodules. Clinically, suspicion for RCC with local regional lymph node metastasis and involvement of lung/mediastinum/hilar is very high. Reviewed his previous medical history, he has a history of ANCA vasculitis diagnosed via kidney biopsy in 2009. While suspicion for RCC with lung involvement is high,  ANCA vasculitis can have manifestation with parenchymal lung nodules, hilar adenopathy, and rarely tumor-like masses.  Vasculitis certainly is a differential diagnosis in his case. Patient has CAD with  stent placement in was on Plavix and aspirin which are currently being held for nephrectomy. Hematuria seems to have subsided after aspirin and Plavix being held.  He is at risk of recurrent hematuria and urology recommends cytoreductive nephropathy which I agree with. I recommend intraoperative tissue sampling with frozen section prior to nephrectomy if feasible. In addition I recommend biopsy of his mediastinal or hilar lymphadenopathy while he is off Plavix and Aspirin for further clarification. I discussed with patient and he wants to further discuss with his son for decision.  Discussed with Drs.Erlene Quan and Dhungel via secure chat.   Thank you for allowing me to participate in the care of this patient.    Earlie Server, MD, PhD Hematology Oncology [facility] at [facility] Pager- [phone] 05/03/2019

## 2019-05-03 NOTE — Progress Notes (Signed)
PROGRESS NOTE    Mark Donaldson  ZOX:[id number] DOB: [birth year] DOA: 05/02/2019 PCP: System, Pcp Not In    Chief Complaint  Patient presents with  . Flank Pain  . 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  UA showed significant RBC, WBC and bacteria.  CT renal study showed 7.4 cm left lower pole renal mass concerning for renal cell carcinoma and mild left hydronephrosis secondary to mass-effect versus blood clot within the left ureter.  Also had thick-walled bladder possibly secondary to enlarged prostate. Patient septic with tachycardia, tachypnea, uncontrolled hypertension and lactic acidosis and ATN.  Placed on empiric IV Rocephin, IV fluid bolus.  Culture sent and admitted to hospital service.  Assessment & Plan:   Principal Problem: Severe sepsis with acute target organ damage. Secondary to UTI. Sepsis now resolved with IV fluids.  Left lower abdominal and flank pain resolved as well.  Continue empiric IV Rocephin.  Urine culture with mixed bacteria.   Active problems Left renal mass suspicious of RCC and hydronephrosis of left kidney. Urology consult appreciated.  Plan on left nephrectomy on 4/22 as inpatient (awaiting 5 days for Plavix to clear from the system). obtain CT chest, abdomen pelvis for staging and rule out any spread or cancerous lesion.   Chronic diastolic CHF/CAD s/p LAD stent Last echo from [facility] shows EF of >55% with grade 1 diastolic dysfunction and degenerative mitral valve disease.  Follows with [facility]  cardiology. Currently euvolemic.  Aspirin and Plavix on hold.  Continue statin and beta-blocker.  Holding lisinopril and Aldactone due to AKI.  Patient seen by cardiology for preop clearance and recommends moderate risk for surgery.  Follow 2D echo.  No further preoperative cardiac testing needed.  Acute tubular necrosis Likely prerenal with sepsis + mild left-sided hydronephrosis.  Monitor with fluids.  Avoid ACE inhibitor/Aldactone and nephrotoxins.  Uncontrolled hypertension Likely associated with pain and sepsis.  Continue metoprolol and as needed hydralazine.  Gross hematuria Likely from [name].  Currently resolved.  H&H stable.  DVT prophylaxis: SCDs Code Status: Full code Family Communication: Discussed with son on 4/19. Disposition: Plan on surgery on 4/22  Status is: Inpatient    Dispo: The patient is from: Home              Anticipated d/c is to: Home              Anticipated d/c date is: > 3 days              Patient currently is not medically stable to d/c.  Patient requires inpatient monitoring with plan on surgery (nephrectomy) on 4/22.       Consultants:   Urology     Procedures: CT renal study, 2D echo   Antimicrobials: IV Rocephin   Subjective: Seen and examined with the help of Spanish interpreter.  Denies further left lower quadrant and left flank pain.  Reported substernal chest pain which resolved with a dose of oxycodone yesterday.  EKG was unremarkable.  No further hematuria.   Objective: Vitals:   05/02/19 2108 05/03/19 0502 05/03/19 8119  05/03/19 1247  BP: 136/90 (!) 148/88 (!) 147/96 131/89  Pulse: 73 73 67 72  Resp: 20 20  15   Temp: 98.9 F (37.2 C) 98.3 F (36.8 C)  98 F (36.7 C)  TempSrc: Oral Oral  Oral  SpO2: 100% 99%  98%  Weight:      Height:        Intake/Output Summary (Last 24 hours) at 05/03/2019 1313 Last data filed at 05/03/2019 1030 Gross per 24 hour  Intake 2097.59 ml  Output 1400 ml  Net 697.59 ml   Filed  Weights   05/01/19 2336 05/02/19 0541  Weight: 71.7 kg 69.9 kg   Examination General: Not in distress HEENT: Moist mucosa, supple neck Chest: Clear bilaterally CVs: Normal S1-S2 GI: Soft, nondistended, bowel sounds present, no left lower quadrant or CVA tenderness Musculoskeletal: Warm, no edema     Data Reviewed: I have personally reviewed following labs and imaging studies  CBC: Recent Labs  Lab 05/01/19 2344 05/02/19 0629 05/03/19 0531  WBC 13.7* 11.1* 10.4  HGB 14.6 14.8 14.5  HCT 43.2 41.9 43.1  MCV 89.4 86.2 90.2  PLT 185 173 250    Basic Metabolic Panel: Recent Labs  Lab 05/01/19 2344 05/03/19 0531  NA 130* 139  K 3.7 4.3  CL 96* 107  CO2 22 27  GLUCOSE 106* 99  BUN 17 15  CREATININE 1.29* 1.08  CALCIUM 8.5* 8.3*    GFR: Estimated Creatinine Clearance: 65.6 mL/min (by C-G formula based on SCr of 1.08 mg/dL).  Liver Function Tests: Recent Labs  Lab 05/03/19 0531  AST 28  ALT 20  ALKPHOS 72  BILITOT 0.9  PROT 6.2*  ALBUMIN 3.0*    CBG: No results for input(s): GLUCAP in the last 168 hours.   Recent Results (from the past 240 hour(s))  Urine culture     Status: Abnormal   Collection Time: 05/01/19 11:44 PM   Specimen: In/Out Cath Urine  Result Value Ref Range Status   Specimen Description   Final    IN/OUT CATH URINE Performed at [facility], [address] [place], [place] [address]    Special Requests   Final    NONE Performed at [facility], [address] [place], [place] [address]    Culture MULTIPLE SPECIES PRESENT, SUGGEST RECOLLECTION (A)  Final   Report Status 05/03/2019 FINAL  Final  Blood culture (routine x 2)     Status: None (Preliminary result)   Collection Time: 05/02/19  2:33 AM   Specimen: BLOOD  Result Value Ref Range Status   Specimen Description BLOOD LEFT WRIST  Final   Special Requests   Final    BOTTLES DRAWN AEROBIC AND ANAEROBIC Blood Culture adequate volume   Culture   Final     NO GROWTH 1 DAY Performed at [facility] [address], [place], [place] [address]    Report Status PENDING  Incomplete  Blood culture (routine x 2)     Status: None (Preliminary result)   Collection Time: 05/02/19  2:34 AM   Specimen: BLOOD  Result Value Ref Range Status   Specimen Description BLOOD LEFT [facility]  Final   Special Requests   Final    BOTTLES DRAWN AEROBIC AND ANAEROBIC Blood Culture adequate volume   Culture   Final    NO GROWTH 1 DAY Performed at [facility], [address] [place], [place] [address]    Report Status PENDING  Incomplete  Respiratory Panel  by RT PCR (Flu A&B, Covid) - Nasopharyngeal Swab     Status: None   Collection Time: 05/02/19  2:34 AM   Specimen: Nasopharyngeal Swab  Result Value Ref Range Status   SARS Coronavirus 2 by RT PCR NEGATIVE NEGATIVE Final    Comment: (NOTE) SARS-CoV-2 target nucleic acids are NOT DETECTED. The SARS-CoV-2 RNA is generally detectable in upper respiratoy specimens during the acute phase of infection. The lowest concentration of SARS-CoV-2 viral copies this assay can detect is 131 copies/mL. A negative result does not preclude SARS-Cov-2 infection and should not be used as the sole basis for treatment or other patient management decisions. A negative result may occur with  improper specimen collection/handling, submission of specimen other than nasopharyngeal swab, presence of viral mutation(s) within the areas targeted by this assay, and inadequate number of viral copies (<131 copies/mL). A negative result must be combined with clinical observations, patient history, and epidemiological information. The expected result is Negative. Fact Sheet for Patients:  PinkCheek.be Fact Sheet for Healthcare Providers:  GravelBags.it This test is not yet ap proved or cleared by the [place] FDA and  has been authorized for detection and/or  diagnosis of SARS-CoV-2 by FDA under an Emergency Use Authorization (EUA). This EUA will remain  in effect (meaning this test can be used) for the duration of the COVID-19 declaration under Section 564(b)(1) of the Act, 21 U.S.C. section 360bbb-3(b)(1), unless the authorization is terminated or revoked sooner.    Influenza A by PCR NEGATIVE NEGATIVE Final   Influenza B by PCR NEGATIVE NEGATIVE Final    Comment: (NOTE) The Xpert Xpress SARS-CoV-2/FLU/RSV assay is intended as an aid in  the diagnosis of influenza from Nasopharyngeal swab specimens and  should not be used as a sole basis for treatment. Nasal washings and  aspirates are unacceptable for Xpert Xpress SARS-CoV-2/FLU/RSV  testing. Fact Sheet for Patients: PinkCheek.be Fact Sheet for Healthcare Providers: GravelBags.it This test is not yet approved or cleared by the [place] FDA and  has been authorized for detection and/or diagnosis of SARS-CoV-2 by  FDA under an Emergency Use Authorization (EUA). This EUA will remain  in effect (meaning this test can be used) for the duration of the  Covid-19 declaration under Section 564(b)(1) of the Act, 21  U.S.C. section 360bbb-3(b)(1), unless the authorization is  terminated or revoked. Performed at [facility], [address] [place], [place] [address]          Radiology Studies: CT Renal Stone Study  Result Date: 05/02/2019 CLINICAL DATA:  Left flank pain, lower back pain EXAM: CT ABDOMEN AND PELVIS WITHOUT CONTRAST TECHNIQUE: Multidetector CT imaging of the abdomen and pelvis was performed following the standard protocol without IV contrast. COMPARISON:  None. FINDINGS: Lower chest: Lung bases are clear. No effusions. Heart is normal size. Hepatobiliary: No focal hepatic abnormality. Gallbladder unremarkable. Pancreas: No focal abnormality or ductal dilatation. Spleen: No focal abnormality.  Normal size.  Adrenals/Urinary Tract: Large solid mass is seen in the lower pole of the left kidney measuring 7.4 x 6.9 cm. Central calcifications noted. Findings concerning for renal cell carcinoma. There is mild left hydronephrosis which may be related to mass effect from the mass on the adjacent proximal left ureter. High-density material is seen within the left renal collecting system and ureter compatible with blood. No hydronephrosis on the right. Adrenal glands are unremarkable. Bladder wall appears diffusely thickened. Stomach/Bowel: Normal appendix. Stomach, large and small bowel grossly unremarkable. Vascular/Lymphatic: No evidence of  aneurysm or adenopathy. Reproductive: Prostate enlargement. Other: 2 No free fluid or free air. Musculoskeletal: No acute bony abnormality. IMPRESSION: Large solid mass involving the lower pole of the left kidney concerning for renal cell carcinoma measuring up to 7.4 cm. Mild left hydronephrosis, possibly related to mass effect or blood clot within the left ureter. Diffusely thick walled bladder, likely related to prostate enlargement. Electronically Signed   By: Rolm Baptise M.D.   On: 05/02/2019 01:24        Scheduled Meds: . atorvastatin  80 mg Oral QHS  . metoprolol succinate  150 mg Oral Daily   Continuous Infusions: . sodium chloride 125 mL/hr at 05/03/19 1209  . sodium chloride Stopped (05/03/19 0325)  . cefTRIAXone (ROCEPHIN)  IV 200 mL/hr at 05/03/19 0334     LOS: 1 day    Time spent: 25 minutes    Orpheus Hayhurst, MD [facility]   To contact the attending provider between 7A-7P or the covering provider during after hours 7P-7A, please log into the web site [email or web address] and access using universal  password for that web site. If you do not have the password, please call the hospital operator.  05/03/2019, 1:13 PM

## 2019-05-03 NOTE — Care Management (Addendum)
TOC consult for :  Insurance  Per MD "Urology was recommending if we can obtain an emergent/temporary insurance for him while he is in the hospital in order for him to get surgery later this week"   Patient is affiliated with [facility].  I have reached out to my leadership team to determine if there are any additional resources that can be offered to the patient    Update:  Per [facility] - "Medicaid doesn't have a 'temporary' coverage and emergent coverage is only available for non citizens"  They will review the case   05/04/19 - 0800  Per patient accounting "There is not any type of Emergent/Temporary insurance.  With him being self-pay/uninsured we can have him evaluated for Medicaid but that will not be an immediate approval.  If you can provide me with the patient demographics I will be happy to research and provide an update on what options are available and what steps we may have already taken to assist him."

## 2019-05-03 NOTE — Progress Notes (Signed)
Urology Inpatient Progress Note  Subjective: Mark Donaldson is a 59 y.o. male with PMH CAD s/p cardiac stent, systolic heart failure, and ANCA vasculitis admitted on 05/02/2019 with left flank pain and gross hematuria with noncontrast CT findings suggestive of a 7.4 cm left lower pole renal mass with mass-effect versus ureteral blood clot and upstream hydronephrosis.  Holding anticoagulation.  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  Creatinine down today, 1.08.  WBC count down today, 10.4.  Hemoglobin stable at 14.5.  Urine culture resulted with multiple species.  Blood cultures pending with no growth at 1 day.  Today, patient is resting comfortably in bed.  He states his flank pain has resolved and notes that his urine has progressively cleared overnight.  Anti-infectives: Anti-infectives (From admission, onward)   Start     Dose/Rate Route Frequency Ordered Stop   05/03/19 0300  cefTRIAXone (ROCEPHIN) 1 g in sodium chloride 0.9 % 100 mL IVPB     1 g 200 mL/hr over 30 Minutes Intravenous Every 24 hours 05/02/19 0307     05/02/19 0230  cefTRIAXone (ROCEPHIN) 1 g in sodium chloride 0.9 % 100 mL IVPB     1 g 200 mL/hr over 30 Minutes Intravenous  Once 05/02/19 0225 05/02/19 0406      Current Facility-Administered Medications  Medication Dose Route Frequency Provider Last Rate Last Admin  . 0.9 %  sodium chloride infusion   Intravenous Continuous Athena Masse, MD 125 mL/hr at 05/03/19 1209 New Bag  at 05/03/19 1209  . 0.9 %  sodium chloride infusion   Intravenous PRN Louellen Molder, MD   Stopped at 05/03/19 0325  . acetaminophen (TYLENOL) tablet 650 mg  650 mg Oral Q6H PRN Athena Masse, MD   650 mg at 05/02/19 2237   Or  . acetaminophen (TYLENOL) suppository 650 mg  650 mg Rectal Q6H PRN Athena Masse, MD      . atorvastatin (LIPITOR) tablet 80 mg  80 mg Oral QHS Dhungel, Nishant, MD   80 mg at 05/02/19 2238  . cefTRIAXone (ROCEPHIN) 1 g in sodium chloride 0.9 % 100 mL IVPB  1 g Intravenous Q24H Athena Masse, MD 200 mL/hr at 05/03/19 0334 Rate Verify at 05/03/19 0334  . hydrALAZINE (APRESOLINE) injection 10 mg  10 mg Intravenous Q6H PRN Dhungel, Nishant, MD   10 mg at 05/02/19 0849  . metoprolol succinate (TOPROL-XL) 24 hr tablet 150 mg  150 mg Oral Daily Dhungel, Nishant, MD   150 mg at 05/03/19 0905  . morphine 4 MG/ML injection 4 mg  4 mg Intravenous Q2H PRN Athena Masse, MD   4 mg at 05/02/19 0453  . ondansetron (ZOFRAN) tablet 4 mg  4 mg Oral Q6H PRN Athena Masse, MD       Or  . ondansetron [facility]) injection 4 mg  4 mg Intravenous Q6H PRN Athena Masse, MD         Objective: Vital signs in last 24 hours: Temp:  [98 F (36.7 C)-98.9 F (37.2 C)] 98 F (36.7 C) (04/20 1247) Pulse Rate:  [67-73]  72 (04/20 1247) Resp:  [15-20] 15 (04/20 1247) BP: (131-148)/(88-96) 131/89 (04/20 1247) SpO2:  [98 %-100 %] 98 % (04/20 1247)  Intake/Output from previous day: 04/19 0701 - 04/20 0700 In: 2097.6 [P.O.:120; I.V.:1951.2; IV Piggyback:26.4] Out: 2450 [Urine:2450] Intake/Output this shift: Total I/O In: 120 [P.O.:120] Out: -   Physical Exam Vitals and nursing note reviewed.  Constitutional:      General: He is not in acute distress.    Appearance: He is not ill-appearing, toxic-appearing or diaphoretic.  HENT:     Head: Normocephalic and atraumatic.  Pulmonary:     Effort: Pulmonary effort is normal. No respiratory distress.  Skin:    General: Skin is warm  and dry.  Neurological:     Mental Status: He is alert and oriented to person, place, and time.  Psychiatric:        Mood and Affect: Mood normal.        Behavior: Behavior normal.    Lab Results:  Recent Labs    05/02/19 0629 05/03/19 0531  WBC 11.1* 10.4  HGB 14.8 14.5  HCT 41.9 43.1  PLT 173 175   BMET Recent Labs    05/01/19 2344 05/03/19 0531  NA 130* 139  K 3.7 4.3  CL 96* 107  CO2 22 27  GLUCOSE 106* 99  BUN 17 15  CREATININE 1.29* 1.08  CALCIUM 8.5* 8.3*   PT/INR Recent Labs    05/02/19 0629  LABPROT 12.3  INR 0.9   Studies/Results: CT ABDOMEN PELVIS W WO CONTRAST  Result Date: 05/03/2019 CLINICAL DATA:  Renal mass. EXAM: CT CHEST WITH CONTRAST CT ABDOMEN AND PELVIS WITH AND WITHOUT CONTRAST TECHNIQUE: Multidetector CT imaging of the chest was performed during intravenous contrast administration. Multidetector CT imaging of the abdomen and pelvis was performed following the standard protocol before and during bolus administration of intravenous contrast. CONTRAST:  120mL OMNIPAQUE IOHEXOL 300 MG/ML  SOLN COMPARISON:  05/02/2019 CT stone study. FINDINGS: CT CHEST FINDINGS Cardiovascular: The heart size is normal. No substantial pericardial effusion. Coronary artery calcification is evident. No thoracic aortic aneurysm. Mediastinum/Nodes: Necrotic lymph nodes are identified in the right mediastinum along the pleural surface and right paratracheal space. Nodal conglomeration to the right of the trachea measures 3.5 x 1.9 cm on 22/3. No left hilar lymphadenopathy. The esophagus has normal imaging features. There is no axillary lymphadenopathy. 15 mm short axis right retrocrural node is visible on 34/18. Lungs/Pleura: 1.7 cm perifissural nodule noted on the minor fissure (82/5). 8 mm posterior left lower lobe nodule seen on 78/5. 5 mm medial right upper lobe nodule visible on 57/5. Dependent atelectasis noted in both lower lobes. Tiny bilateral pleural effusions evident.  Musculoskeletal: No worrisome lytic or sclerotic osseous abnormality. CT ABDOMEN AND PELVIS FINDINGS Hepatobiliary: No suspicious focal abnormality within the liver parenchyma. There is no evidence for gallstones, gallbladder wall thickening, or pericholecystic fluid. No intrahepatic or extrahepatic biliary dilation. Pancreas: No focal mass lesion. No dilatation of the main duct. No intraparenchymal cyst. No peripancreatic edema. Spleen: No splenomegaly. No focal mass lesion. Adrenals/Urinary Tract: No adrenal nodule or mass. Right kidney unremarkable. 6.4 x 6.7 x 7.0 cm heterogeneously enhancing mass is identified in the lower pole left kidney. Lesion extends into the central sinus fat and displaces the renal pelvis and proximal ureter posteriorly. Left renal vein is patent. No evidence for hydroureter. The urinary bladder appears normal for the degree of distention. Stomach/Bowel: Stomach is unremarkable. No gastric wall thickening. No evidence of outlet  obstruction. Duodenum is normally positioned as is the ligament of Treitz. No small bowel wall thickening. No small bowel dilatation. The terminal ileum is normal. The appendix is normal. No gross colonic mass. No colonic wall thickening. Vascular/Lymphatic: No abdominal aortic aneurysm. 9 mm left para-aortic node on 66/18 is not enlarged by CT criteria but is suspicious for metastatic disease. No pelvic sidewall lymphadenopathy. Reproductive: The prostate gland and seminal vesicles are unremarkable. Other: No intraperitoneal free fluid. Musculoskeletal: No worrisome lytic or sclerotic osseous abnormality. 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 IMPRESSION: 1. 7 cm heterogeneous mass lesion in the lower pole left kidney is compatible with renal cell carcinoma. Mass generates mass-effect on the left renal pelvis and proximal left ureter. Left renal vein patent. 2. Necrotic lymphadenopathy in the mediastinum and right  suprahilar region is highly suspicious for metastatic disease. Bilateral pulmonary nodules also raise concern for metastatic involvement. 3. Right retrocrural lymphadenopathy with upper normal left para-aortic lymph node also suggest metastatic involvement. 4. 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 Electronically Signed   By: Misty Stanley M.D.   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  Labs stable, not actively bleeding.  Given mass-effect of the primary mass with left hydronephrosis, we continue to recommend left nephrectomy.  Also recommend oncology consult for further management of widespread disease.  Dr. Erlene Quan to round on the patient this afternoon to review results of contrast CT scan.  Debroah Loop, PA-C 05/03/2019

## 2019-05-04 ENCOUNTER — Inpatient Hospital Stay (HOSPITAL_COMMUNITY)
Admit: 2019-05-04 | Discharge: 2019-05-04 | Disposition: A | Discharge status: Home / Self Care | Payer: Self-pay | Attending: Internal Medicine | Admitting: Internal Medicine

## 2019-05-04 ENCOUNTER — Encounter: Payer: Self-pay | Admitting: Internal Medicine

## 2019-05-04 DIAGNOSIS — Z8679 Personal history of other diseases of the circulatory system: Secondary | ICD-10-CM

## 2019-05-04 DIAGNOSIS — I5032 Chronic diastolic (congestive) heart failure: Secondary | ICD-10-CM

## 2019-05-04 LAB — ABO/RH: ABO/RH(D): O POS

## 2019-05-04 LAB — ECHOCARDIOGRAM COMPLETE
Height: 65 in
Weight: 2465.62 oz

## 2019-05-04 MED ORDER — CEFAZOLIN SODIUM-DEXTROSE 2-4 GM/100ML-% IV SOLN: 2.0000 g | Freq: Once | INTRAVENOUS | Status: DC

## 2019-05-04 NOTE — Progress Notes (Signed)
05/04/19  Family meeting the patient and his family today.  Elected to undergo cytoreductive left nephrectomy.  As per previous discussion, reviewed risk benefits.  N.p.o. midnight.  Consent order placed.  Hollice Espy, MD

## 2019-05-04 NOTE — Plan of Care (Signed)
Interpreter used for communication for POC updates and introduction.

## 2019-05-04 NOTE — Progress Notes (Signed)
PROGRESS NOTE    Mark Donaldson  EZM:[id number] DOB: December 25, 1960 DOA: 05/02/2019 PCP: System, Pcp Not In    Chief Complaint  Patient presents with  . Flank Pain  . Hematuria    Subjective: The patient was seen and examined.  Patient does not speak English, interpreter was not available Tried with minimal magnification, patient is in no distress. Nursing staff report of no dysuria or hematuria Patient currently has no complaints Extensive discussions with neurology Finally decided for nephrectomy to be done here at [facility]     ------------------------------------------------------------------------------------------------------------------------ Brief Narrative: 59 year old Spanish-speaking with history of CAD s/p cardiac cath with PCI to LAD in 2018 (follows with 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  UA showed significant RBC, WBC and bacteria.  CT renal study showed 7.4 cm left lower pole renal mass concerning for renal cell carcinoma and mild left hydronephrosis secondary to mass-effect versus blood clot within the left ureter.  Also had thick-walled bladder possibly secondary to enlarged prostate. Patient septic with tachycardia, tachypnea, uncontrolled hypertension and lactic acidosis and ATN.  Placed on empiric IV Rocephin, IV fluid bolus.  Culture sent and admitted to hospital service.  Assessment & Plan:   Principal Problem: Severe sepsis with acute target organ damage. -Much improved, resolved, afebrile normotensive, improved leukocytosis, lactic acidosis Secondary to UTI.  -Left lower abdominal and flank pain resolved as well.  Continue empiric IV  Rocephin.   -Urine culture with mixed bacteria.   Left renal mass suspicious of RCC and hydronephrosis of left kidney. -Appreciate urology following very closely  -  plan on left cytoreductive nephrectomy on 05/05/19 as inpatient  (awaiting 5 days for Plavix to clear from the system).  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  Follows with [facility] cardiology. Currently euvolemic.  - Aspirin and Plavix on hold.   -Continue statin and beta-blocker.   Holding lisinopril and Aldactone due to AKI.  Patient seen by cardiology for preop clearance and recommends moderate risk for surgery.  Follow 2D echo.... Pending final result   No further preoperative cardiac testing needed.  Acute tubular necrosis Likely prerenal with sepsis + mild left-sided hydronephrosis.  Monitor with fluids.   Avoid ACE inhibitor/Aldactone and nephrotoxins.  Uncontrolled hypertension Likely associated with pain and sepsis.  Continue metoprolol and as needed hydralazine.  Gross hematuria Likely from [place].  Currently resolved.  H&H stable. Plan as above, pursuing  DVT prophylaxis: SCDs Code Status: Full code Family Communication: Discussed with son on 4/19. Disposition: Plan on surgery on 4/22  Status is: Inpatient    Dispo: The patient is from: Home              Anticipated d/c is to: Home  Anticipated d/c date is: > 3  days              Patient currently is not medically stable to d/c.  Patient requires inpatient monitoring with plan on surgery (nephrectomy) on 4/22.       Consultants:   Urology     Procedures: CT renal study, 2D echo   Antimicrobials: IV Rocephin    Objective: Vitals:   05/03/19 1247 05/03/19 2032 05/04/19 0320 05/04/19 1017  BP: 131/89 (!) 157/88 (!) 160/92 (!) 153/88  Pulse: 72 67 67 75  Resp: 15 18 16 18   Temp: 98 F (36.7 C) 98.5 F (36.9 C) 98.8 F (37.1 C) (!) 97.4 F (36.3 C)  TempSrc: Oral Oral Oral Oral  SpO2: 98% 98% 99% 99%  Weight:      Height:        Intake/Output Summary (Last 24 hours) at 05/04/2019 1250 Last data filed at 05/04/2019 1034 Gross per 24 hour  Intake 600 ml  Output 2575 ml  Net -1975 ml   Filed Weights   05/01/19 2336 05/02/19 0541  Weight: 71.7 kg 69.9 kg   Physical Exam  Constitution:  Alert, cooperative, no distress,  Psychiatric: Normal and stable mood and affect, cognition intact,   HEENT: Normocephalic, PERRL, otherwise with in Normal limits  Chest:Chest symmetric Cardio vascular:  S1/S2, RRR, No murmure, No Rubs or Gallops  pulmonary: Clear to auscultation bilaterally, respirations unlabored, negative wheezes / crackles Abdomen: Soft, non-tender, non-distended, bowel sounds,no masses, no organomegaly Muscular skeletal: Limited exam - in bed, able to move all 4 extremities, Normal strength,  Neuro: CNII-XII intact. , normal motor and sensation, reflexes intact  Extremities: No pitting edema lower extremities, +2 pulses  Skin: Dry, warm to touch, negative for any Rashes, No open wounds Wounds: per nursing documentation       Data Reviewed: I have personally reviewed following labs and imaging studies  CBC: Recent Labs  Lab 05/01/19 2344 05/02/19 0629 05/03/19 0531  WBC 13.7* 11.1* 10.4  HGB 14.6 14.8 14.5  HCT 43.2 41.9 43.1  MCV 89.4 86.2 90.2  PLT 185 173 517    Basic Metabolic Panel: Recent Labs   Lab 05/01/19 2344 05/03/19 0531  NA 130* 139  K 3.7 4.3  CL 96* 107  CO2 22 27  GLUCOSE 106* 99  BUN 17 15  CREATININE 1.29* 1.08  CALCIUM 8.5* 8.3*    GFR: Estimated Creatinine Clearance: 65.6 mL/min (by C-G formula based on SCr of 1.08 mg/dL).  Liver Function Tests: Recent Labs  Lab 05/03/19 0531  AST 28  ALT 20  ALKPHOS 72  BILITOT 0.9  PROT 6.2*  ALBUMIN 3.0*    CBG: No results for input(s): GLUCAP in the last 168 hours.   Recent Results (from the past 240 hour(s))  Urine culture     Status: Abnormal   Collection Time: 05/01/19 11:44 PM   Specimen: In/Out Cath Urine  Result Value Ref Range Status   Specimen Description   Final    IN/OUT CATH URINE Performed at [facility], [address], [place], [place] [address]    Special Requests   Final    NONE Performed at [facility], [address] [place],  [address]    Culture MULTIPLE SPECIES PRESENT, SUGGEST RECOLLECTION (A)  Final   Report Status 05/03/2019 FINAL  Final  Blood culture (routine x 2)     Status: None (Preliminary result)   Collection Time: 05/02/19  2:33  AM   Specimen: BLOOD  Result Value Ref Range Status   Specimen Description BLOOD LEFT WRIST  Final   Special Requests   Final    BOTTLES DRAWN AEROBIC AND ANAEROBIC Blood Culture adequate volume   Culture   Final    NO GROWTH 2 DAYS Performed at [facility], [address], [place], [place] [address]    Report Status PENDING  Incomplete  Blood culture (routine x 2)     Status: None (Preliminary result)   Collection Time: 05/02/19  2:34 AM   Specimen: BLOOD  Result Value Ref Range Status   Specimen Description BLOOD LEFT [facility] [place]  Final   Special Requests   Final    BOTTLES DRAWN AEROBIC AND ANAEROBIC Blood Culture adequate volume   Culture   Final    NO GROWTH 2 DAYS Performed at [facility], [address], [place], [place] [address]    Report Status PENDING  Incomplete  Respiratory  Panel by RT PCR (Flu A&B, Covid) - Nasopharyngeal Swab     Status: None   Collection Time: 05/02/19  2:34 AM   Specimen: Nasopharyngeal Swab  Result Value Ref Range Status   SARS Coronavirus 2 by RT PCR NEGATIVE NEGATIVE Final    Comment: (NOTE) S    Influenza A by PCR NEGATIVE NEGATIVE Final   Influenza B by PCR NEGATIVE NEGATIVE Final      Radiology Studies: CT ABDOMEN PELVIS W WO CONTRAST  Result Date: 05/03/2019 CLINICAL DATA:  Renal mass. EXAM: CT CHEST WITH CONTRAST CT ABDOMEN AND PELVIS WITH AND WITHOUT CONTRAST TECHNIQUE: Multidetector CT imaging of the chest was performed during intravenous contrast administration. Multidetector CT imaging of the abdomen and pelvis was performed following the standard protocol before and during bolus administration of intravenous contrast. CONTRAST:  168mL OMNIPAQUE IOHEXOL 300 MG/ML  SOLN COMPARISON:  05/02/2019 CT stone study. FINDINGS: CT CHEST FINDINGS Cardiovascular: The heart size is normal. No substantial pericardial effusion. Coronary artery calcification is evident. No thoracic aortic aneurysm. Mediastinum/Nodes: Necrotic lymph nodes are identified in the right mediastinum along the pleural surface and right paratracheal space. Nodal conglomeration to the right of the trachea measures 3.5 x 1.9 cm on 22/3. No left hilar lymphadenopathy. The esophagus has normal imaging features. There is no axillary lymphadenopathy. 15 mm short axis right retrocrural node is visible on 34/18. Lungs/Pleura: 1.7 cm perifissural nodule noted on the minor fissure (82/5). 8 mm posterior left lower lobe nodule seen on 78/5. 5 mm medial right upper lobe nodule visible on 57/5. Dependent atelectasis noted in both lower lobes. Tiny bilateral pleural effusions evident. Musculoskeletal: No worrisome lytic or sclerotic osseous abnormality. CT ABDOMEN AND PELVIS FINDINGS Hepatobiliary: No suspicious focal abnormality within the liver parenchyma. There is no evidence for  gallstones, gallbladder wall thickening, or pericholecystic fluid. No intrahepatic or extrahepatic biliary dilation. Pancreas: No focal mass lesion. No dilatation of the main duct. No intraparenchymal cyst. No peripancreatic edema. Spleen: No splenomegaly. No focal mass lesion. Adrenals/Urinary Tract: No adrenal nodule or mass. Right kidney unremarkable. 6.4 x 6.7 x 7.0 cm heterogeneously enhancing mass is identified in the lower pole left kidney. Lesion extends into the central sinus fat and displaces the renal pelvis and proximal ureter posteriorly. Left renal vein is patent. No evidence for hydroureter. The urinary bladder appears normal for the degree of distention. Stomach/Bowel: Stomach is unremarkable. No gastric wall thickening. No evidence of outlet obstruction. Duodenum is normally positioned as is the ligament of Treitz. No  small bowel wall thickening. No small bowel dilatation. The terminal ileum is normal. The appendix is normal. No gross colonic mass. No colonic wall thickening. Vascular/Lymphatic: No abdominal aortic aneurysm. 9 mm left para-aortic node on 66/18 is not enlarged by CT criteria but is suspicious for metastatic disease. No pelvic sidewall lymphadenopathy. Reproductive: The prostate gland and seminal vesicles are unremarkable. Other: No intraperitoneal free fluid. Musculoskeletal: No worrisome lytic or sclerotic osseous abnormality. 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 IMPRESSION: 1. 7 cm heterogeneous mass lesion in the lower pole left kidney is compatible with renal cell carcinoma. Mass generates mass-effect on the left renal pelvis and proximal left ureter. Left renal vein patent. 2. Necrotic lymphadenopathy in the mediastinum and right suprahilar region is highly suspicious for metastatic disease. Bilateral pulmonary nodules also raise concern for metastatic involvement. 3. Right retrocrural lymphadenopathy with upper normal left  para-aortic lymph node also suggest metastatic involvement. 4. 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 Electronically Signed   By: Misty Stanley M.D.   On: 05/03/2019 13:35   CT CHEST W CONTRAST  Result Date: 05/03/2019 CLINICAL DATA:  Renal mass. EXAM: CT CHEST WITH CONTRAST CT ABDOMEN AND PELVIS WITH AND WITHOUT CONTRAST TECHNIQUE: Multidetector CT imaging of the chest was performed during intravenous contrast administration. Multidetector CT imaging of the abdomen and pelvis was performed following the standard protocol before and during bolus administration of intravenous contrast. CONTRAST:  112mL OMNIPAQUE IOHEXOL 300 MG/ML  SOLN COMPARISON:  05/02/2019 CT stone study. FINDINGS: CT CHEST FINDINGS Cardiovascular: The heart size is normal. No substantial pericardial effusion. Coronary artery calcification is evident. No thoracic aortic aneurysm. Mediastinum/Nodes: Necrotic lymph nodes are identified in the right mediastinum along the pleural surface and right paratracheal space. Nodal conglomeration to the right of the trachea measures 3.5 x 1.9 cm on 22/3. No left hilar lymphadenopathy. The esophagus has normal imaging features. There is no axillary lymphadenopathy. 15 mm short axis right retrocrural node is visible on 34/18. Lungs/Pleura: 1.7 cm perifissural nodule noted on the minor fissure (82/5). 8 mm posterior left lower lobe nodule seen on 78/5. 5 mm medial right upper lobe nodule visible on 57/5. Dependent atelectasis noted in both lower lobes. Tiny bilateral pleural effusions evident. Musculoskeletal: No worrisome lytic or sclerotic osseous abnormality. CT ABDOMEN AND PELVIS FINDINGS Hepatobiliary: No suspicious focal abnormality within the liver parenchyma. There is no evidence for gallstones, gallbladder wall thickening, or pericholecystic fluid. No intrahepatic or extrahepatic biliary dilation. Pancreas: No focal mass lesion. No dilatation of the main duct. No  intraparenchymal cyst. No peripancreatic edema. Spleen: No splenomegaly. No focal mass lesion. Adrenals/Urinary Tract: No adrenal nodule or mass. Right kidney unremarkable. 6.4 x 6.7 x 7.0 cm heterogeneously enhancing mass is identified in the lower pole left kidney. Lesion extends into the central sinus fat and displaces the renal pelvis and proximal ureter posteriorly. Left renal vein is patent. No evidence for hydroureter. The urinary bladder appears normal for the degree of distention. Stomach/Bowel: Stomach is unremarkable. No gastric wall thickening. No evidence of outlet obstruction. Duodenum is normally positioned as is the ligament of Treitz. No small bowel wall thickening. No small bowel dilatation. The terminal ileum is normal. The appendix is normal. No gross colonic mass. No colonic wall thickening. Vascular/Lymphatic: No abdominal aortic aneurysm. 9 mm left para-aortic node on 66/18 is not enlarged by CT criteria but is suspicious for metastatic disease. No pelvic sidewall lymphadenopathy. Reproductive: The prostate gland and seminal  vesicles are unremarkable. Other: No intraperitoneal free fluid. Musculoskeletal: No worrisome lytic or sclerotic osseous abnormality. 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 IMPRESSION: 1. 7 cm heterogeneous mass lesion in the lower pole left kidney is compatible with renal cell carcinoma. Mass generates mass-effect on the left renal pelvis and proximal left ureter. Left renal vein patent. 2. Necrotic lymphadenopathy in the mediastinum and right suprahilar region is highly suspicious for metastatic disease. Bilateral pulmonary nodules also raise concern for metastatic involvement. 3. Right retrocrural lymphadenopathy with upper normal left para-aortic lymph node also suggest metastatic involvement. 4. 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 Electronically Signed   By: Misty Stanley M.D.   On: 05/03/2019  13:35        Scheduled Meds: . atorvastatin  80 mg Oral QHS  . metoprolol succinate  150 mg Oral Daily   Continuous Infusions: . sodium chloride 125 mL/hr at 05/04/19 0514  . sodium chloride 250 mL (05/04/19 0322)  . cefTRIAXone (ROCEPHIN)  IV 1 g (05/04/19 0323)     LOS: 2 days    Time spent: 35 minutes    Deatra James, MD [facility]   To contact the attending provider between 7A-7P or the covering provider during after hours 7P-7A, please log into the web site [email or web address] and access using universal [place] password for that web site. If you do not have the password, please call the hospital operator.  05/04/2019, 12:50 PM

## 2019-05-04 NOTE — Anesthesia Preprocedure Evaluation (Addendum)
Anesthesia Evaluation  Patient identified by MRN, date of birth, ID band Patient awake  General Assessment Comment:Never had general anesthesia  Reviewed: Allergy & Precautions, NPO status , Patient's Chart, lab work & pertinent test results  History of Anesthesia Complications Negative for: history of anesthetic complications  Airway Mallampati: III  TM Distance: >3 FB Neck ROM: Full    Dental no notable dental hx. (+) Teeth Intact, Dental Advisory Given   Pulmonary neg pulmonary ROS, neg sleep apnea, neg COPD, Patient abstained from smoking.Not current smoker,    Pulmonary exam normal breath sounds clear to auscultation       Cardiovascular Exercise Tolerance: Good METShypertension, Pt. on medications + CAD and +CHF  (-) Past MI (-) dysrhythmias  Rhythm:Regular Rate:Normal - Systolic murmurs  TTE 4103: 1. Left ventricular ejection fraction, by estimation, is 60 to 65%. The  left ventricle has normal function. The left ventricle has no regional  wall motion abnormalities. There is mild left ventricular hypertrophy.  Left ventricular diastolic parameters  are consistent with Grade I diastolic dysfunction (impaired relaxation).  2. Right ventricular systolic function is normal. The right ventricular  size is normal.  3. Left atrial size was mildly dilated.  4. The mitral valve is normal in structure. Trivial mitral valve  regurgitation.  5. The aortic valve is tricuspid. Aortic valve regurgitation is not  visualized. Mild aortic valve sclerosis is present, with no evidence of  aortic valve stenosis.    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[place]) No date: CAD (coronary artery disease) No date: Essential hypertension No date: HFrEF (heart failure with reduced ejection fraction) (HCC) No date: Hyperlipidemia LDL goal <70 No date: Ischemic cardiomyopathy No date: Renal disorder    Reproductive/Obstetrics                           Anesthesia Physical Anesthesia Plan  ASA: III  Anesthesia Plan: General   Post-op Pain Management:    Induction: Intravenous  PONV Risk Score and Plan: 4 or greater and Ondansetron, Dexamethasone and Midazolam  Airway Management Planned: Oral ETT  Additional Equipment: Arterial line  Intra-op Plan:   Post-operative Plan: Extubation in OR  Informed Consent: I have reviewed the patients History and Physical, chart, labs and discussed the procedure including the risks, benefits and alternatives for the proposed anesthesia with the patient or authorized representative who has indicated his/her understanding and acceptance.     Dental advisory given  Plan Discussed with: CRNA and Surgeon  Anesthesia Plan Comments: (Discussed risks of anesthesia with patient, including PONV, sore throat, lip/dental damage, bleeding requiring blood products. Rare risks discussed as well, such as cardiorespiratory and neurological sequelae. Patient understands. Used in-person interpreter at bedside , [place]. Pt seen by Dr Fletcher Anon and stratified as low-moderate risk cardiovascularly.)       Anesthesia Quick Evaluation

## 2019-05-04 NOTE — Progress Notes (Addendum)
Hematology/Oncology Progress Note [facility] [phone] [phone]  Patient Care Team: Frazier Richards, MD as PCP - General (Family Medicine)   Name of the patient: Mark Donaldson  [id number]  01/20/1960  Date of visit: 05/04/19   INTERVAL HISTORY-  No acute overnight events.  Patient discussed with his son and agreed with proceeding with additional biopsy with bronchoscopy. Patient son is at bedside.  Patient has no new complaints. Online interpreter service was used for translation.   Review of systems- Review of Systems  Constitutional: Negative for appetite change, chills, fatigue and fever.  HENT:   Negative for hearing loss and voice change.   Eyes: Negative for eye problems.  Respiratory: Negative for chest tightness and cough.   Cardiovascular: Negative for chest pain.  Gastrointestinal: Negative for abdominal distention, abdominal pain and blood in stool.  Endocrine: Negative for hot flashes.  Genitourinary: Negative for difficulty urinating and frequency.   Musculoskeletal: Negative for arthralgias.  Skin: Negative for itching and rash.  Neurological: Negative for extremity weakness.  Hematological: Negative for adenopathy.  Psychiatric/Behavioral: Negative for confusion.    No Known Allergies  Patient Active Problem List   Diagnosis Date Noted   Mediastinal lymphadenopathy    Lung nodule    CAD S/P percutaneous coronary angioplasty 05/02/2019   Left renal mass 05/02/2019   Hydronephrosis of left kidney 05/02/2019   UTI (urinary tract infection) 05/02/2019   AKI (acute kidney injury) ([place]) 05/02/2019   Gross hematuria 05/02/2019   Chronic diastolic CHF (congestive heart failure) ([place]) 05/02/2019   Sepsis ([place]) 05/02/2019   Severe sepsis ([place]) 05/02/2019   Preop cardiovascular exam      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HCC)    CAD (coronary artery  disease)    Essential hypertension    HFrEF (heart failure with reduced ejection fraction) (HCC)    Hyperlipidemia LDL goal <70    Ischemic cardiomyopathy    Renal disorder      Past Surgical History:  Procedure Laterality Date   CARDIAC SURGERY     cardiac cath with 2 stent placement    Social History   Socioeconomic History   Marital status: Married    Spouse name: Not on file   Number of children: Not on file   Years of education: Not on file   Highest education level: Not on file  Occupational History   Not on file  Tobacco Use   Smoking status: Never Smoker   Smokeless tobacco: Never Used  Substance and Sexual Activity   Alcohol use: Yes   Drug use: Not on file   Sexual activity: Not on file  Other Topics Concern   Not on file  Social History Narrative   Not on file   Social Determinants of Health   Financial Resource Strain:    Difficulty of Paying Living Expenses:   Food Insecurity:    Worried About Estate manager/land agent of Food in the Last Year:    Arboriculturist in the Last Year:   Transportation Needs:    Film/video editor (Medical):    Lack of Transportation (Non-Medical):   Physical Activity:    Days of Exercise per Week:    Minutes of Exercise per Session:   Stress:    Feeling of Stress :   Social Connections:    Frequency of Communication with Friends and Family:    Frequency of Social Gatherings with Friends and Family:  Attends Religious Services:    Active Member of Clubs or Organizations:    Attends Music therapist:    Marital Status:   Intimate Partner Violence:    Fear of Current or Ex-Partner:    Emotionally Abused:    Physically Abused:    Sexually Abused:      Family History  Problem Relation Age of Onset   Cancer Mother    Blindness Brother      Current Facility-Administered Medications:    0.9 %  sodium chloride infusion, , Intravenous, Continuous, Judd Gaudier V, MD,  Last Rate: 125 mL/hr at 05/04/19 1428, New Bag at 05/04/19 1428   0.9 %  sodium chloride infusion, , Intravenous, PRN, Dhungel, Nishant, MD, Stopped at 05/04/19 0511   acetaminophen (TYLENOL) tablet 650 mg, 650 mg, Oral, Q6H PRN, 650 mg at 05/02/19 2237 **OR** acetaminophen (TYLENOL) suppository 650 mg, 650 mg, Rectal, Q6H PRN, Athena Masse, MD   atorvastatin (LIPITOR) tablet 80 mg, 80 mg, Oral, QHS, Dhungel, Nishant, MD, 80 mg at 05/03/19 2220   [START ON 05/05/2019] ceFAZolin (ANCEF) IVPB 2g/100 mL premix, 2 g, Intravenous, Once, Hollice Espy, MD   cefTRIAXone (ROCEPHIN) 1 g in sodium chloride 0.9 % 100 mL IVPB, 1 g, Intravenous, Q24H, Athena Masse, MD, Stopped at 05/04/19 0353   hydrALAZINE (APRESOLINE) injection 10 mg, 10 mg, Intravenous, Q6H PRN, Dhungel, Nishant, MD, 10 mg at 05/02/19 0849   metoprolol succinate (TOPROL-XL) 24 hr tablet 150 mg, 150 mg, Oral, Daily, Dhungel, Nishant, MD, 150 mg at 05/04/19 1020   morphine 4 MG/ML injection 4 mg, 4 mg, Intravenous, Q2H PRN, Judd Gaudier V, MD, 4 mg at 05/02/19 0453   ondansetron (ZOFRAN) tablet 4 mg, 4 mg, Oral, Q6H PRN **OR** ondansetron (ZOFRAN) injection 4 mg, 4 mg, Intravenous, Q6H PRN, Athena Masse, MD   Physical exam:  Vitals:   05/03/19 2032 05/04/19 0320 05/04/19 1017 05/04/19 1435  BP: (!) 157/88 (!) 160/92 (!) 153/88 (!) 149/89  Pulse: 67 67 75 65  Resp: 18 16 18 18   Temp: 98.5 F (36.9 C) 98.8 F (37.1 C) (!) 97.4 F (36.3 C) 98.2 F (36.8 C)  TempSrc: Oral Oral Oral Oral  SpO2: 98% 99% 99% 99%  Weight:      Height:       Physical Exam  Constitutional: He is oriented to person, place, and time. No distress.  HENT:  Head: Normocephalic and atraumatic.  Nose: Nose normal.  Mouth/Throat: Oropharynx is clear and moist. No oropharyngeal exudate.  Eyes: Pupils are equal, round, and reactive to light. EOM are normal. No scleral icterus.  Cardiovascular: Normal rate and regular rhythm.  No murmur  heard. Pulmonary/Chest: Effort normal. No respiratory distress. He has no rales. He exhibits no tenderness.  Abdominal: Soft. He exhibits no distension. There is no abdominal tenderness.  Musculoskeletal:        General: No edema. Normal range of motion.     Cervical back: Normal range of motion and neck supple.  Neurological: He is alert and oriented to person, place, and time.  Skin: Skin is warm and dry. He is not diaphoretic. No erythema.  Psychiatric: Affect normal.       CMP Latest Ref Rng & Units 05/03/2019  Glucose 70 - 99 mg/dL 99  BUN 6 - 20 mg/dL 15  Creatinine 0.61 - 1.24 mg/dL 1.08  Sodium 135 - 145 mmol/L 139  Potassium 3.5 - 5.1 mmol/L 4.3  Chloride 98 - 111 mmol/L  107  CO2 22 - 32 mmol/L 27  Calcium 8.9 - 10.3 mg/dL 8.3(L)  Total Protein 6.5 - 8.1 g/dL 6.2(L)  Total Bilirubin 0.3 - 1.2 mg/dL 0.9  Alkaline Phos 38 - 126 U/L 72  AST 15 - 41 U/L 28  ALT 0 - 44 U/L 20   CBC Latest Ref Rng & Units 05/03/2019  WBC 4.0 - 10.5 K/uL 10.4  Hemoglobin 13.0 - 17.0 g/dL 14.5  Hematocrit 39.0 - 52.0 % 43.1  Platelets 150 - 400 K/uL 175    RADIOGRAPHIC STUDIES: I have personally reviewed the radiological images as listed and agreed with the findings in the report. CT ABDOMEN PELVIS W WO CONTRAST  Result Date: 05/03/2019 CLINICAL DATA:  Renal mass. EXAM: CT CHEST WITH CONTRAST CT ABDOMEN AND PELVIS WITH AND WITHOUT CONTRAST TECHNIQUE: Multidetector CT imaging of the chest was performed during intravenous contrast administration. Multidetector CT imaging of the abdomen and pelvis was performed following the standard protocol before and during bolus administration of intravenous contrast. CONTRAST:  176mL OMNIPAQUE IOHEXOL 300 MG/ML  SOLN COMPARISON:  05/02/2019 CT stone study. FINDINGS: CT CHEST FINDINGS Cardiovascular: The heart size is normal. No substantial pericardial effusion. Coronary artery calcification is evident. No thoracic aortic aneurysm. Mediastinum/Nodes: Necrotic  lymph nodes are identified in the right mediastinum along the pleural surface and right paratracheal space. Nodal conglomeration to the right of the trachea measures 3.5 x 1.9 cm on 22/3. No left hilar lymphadenopathy. The esophagus has normal imaging features. There is no axillary lymphadenopathy. 15 mm short axis right retrocrural node is visible on 34/18. Lungs/Pleura: 1.7 cm perifissural nodule noted on the minor fissure (82/5). 8 mm posterior left lower lobe nodule seen on 78/5. 5 mm medial right upper lobe nodule visible on 57/5. Dependent atelectasis noted in both lower lobes. Tiny bilateral pleural effusions evident. Musculoskeletal: No worrisome lytic or sclerotic osseous abnormality. CT ABDOMEN AND PELVIS FINDINGS Hepatobiliary: No suspicious focal abnormality within the liver parenchyma. There is no evidence for gallstones, gallbladder wall thickening, or pericholecystic fluid. No intrahepatic or extrahepatic biliary dilation. Pancreas: No focal mass lesion. No dilatation of the main duct. No intraparenchymal cyst. No peripancreatic edema. Spleen: No splenomegaly. No focal mass lesion. Adrenals/Urinary Tract: No adrenal nodule or mass. Right kidney unremarkable. 6.4 x 6.7 x 7.0 cm heterogeneously enhancing mass is identified in the lower pole left kidney. Lesion extends into the central sinus fat and displaces the renal pelvis and proximal ureter posteriorly. Left renal vein is patent. No evidence for hydroureter. The urinary bladder appears normal for the degree of distention. Stomach/Bowel: Stomach is unremarkable. No gastric wall thickening. No evidence of outlet obstruction. Duodenum is normally positioned as is the ligament of Treitz. No small bowel wall thickening. No small bowel dilatation. The terminal ileum is normal. The appendix is normal. No gross colonic mass. No colonic wall thickening. Vascular/Lymphatic: No abdominal aortic aneurysm. 9 mm left para-aortic node on 66/18 is not enlarged by  CT criteria but is suspicious for metastatic disease. No pelvic sidewall lymphadenopathy. Reproductive: The prostate gland and seminal vesicles are unremarkable. Other: No intraperitoneal free fluid. Musculoskeletal: No worrisome lytic or sclerotic osseous abnormality. 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 IMPRESSION: 1. 7 cm heterogeneous mass lesion in the lower pole left kidney is compatible with renal cell carcinoma. Mass generates mass-effect on the left renal pelvis and proximal left ureter. Left renal vein patent. 2. Necrotic lymphadenopathy in the mediastinum and right suprahilar region  is highly suspicious for metastatic disease. Bilateral pulmonary nodules also raise concern for metastatic involvement. 3. Right retrocrural lymphadenopathy with upper normal left para-aortic lymph node also suggest metastatic involvement. 4. 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 Electronically Signed   By: Misty Stanley M.D.   On: 05/03/2019 13:35   CT CHEST W CONTRAST  Result Date: 05/03/2019 CLINICAL DATA:  Renal mass. EXAM: CT CHEST WITH CONTRAST CT ABDOMEN AND PELVIS WITH AND WITHOUT CONTRAST TECHNIQUE: Multidetector CT imaging of the chest was performed during intravenous contrast administration. Multidetector CT imaging of the abdomen and pelvis was performed following the standard protocol before and during bolus administration of intravenous contrast. CONTRAST:  114mL OMNIPAQUE IOHEXOL 300 MG/ML  SOLN COMPARISON:  05/02/2019 CT stone study. FINDINGS: CT CHEST FINDINGS Cardiovascular: The heart size is normal. No substantial pericardial effusion. Coronary artery calcification is evident. No thoracic aortic aneurysm. Mediastinum/Nodes: Necrotic lymph nodes are identified in the right mediastinum along the pleural surface and right paratracheal space. Nodal conglomeration to the right of the trachea measures 3.5 x 1.9 cm on 22/3. No left hilar  lymphadenopathy. The esophagus has normal imaging features. There is no axillary lymphadenopathy. 15 mm short axis right retrocrural node is visible on 34/18. Lungs/Pleura: 1.7 cm perifissural nodule noted on the minor fissure (82/5). 8 mm posterior left lower lobe nodule seen on 78/5. 5 mm medial right upper lobe nodule visible on 57/5. Dependent atelectasis noted in both lower lobes. Tiny bilateral pleural effusions evident. Musculoskeletal: No worrisome lytic or sclerotic osseous abnormality. CT ABDOMEN AND PELVIS FINDINGS Hepatobiliary: No suspicious focal abnormality within the liver parenchyma. There is no evidence for gallstones, gallbladder wall thickening, or pericholecystic fluid. No intrahepatic or extrahepatic biliary dilation. Pancreas: No focal mass lesion. No dilatation of the main duct. No intraparenchymal cyst. No peripancreatic edema. Spleen: No splenomegaly. No focal mass lesion. Adrenals/Urinary Tract: No adrenal nodule or mass. Right kidney unremarkable. 6.4 x 6.7 x 7.0 cm heterogeneously enhancing mass is identified in the lower pole left kidney. Lesion extends into the central sinus fat and displaces the renal pelvis and proximal ureter posteriorly. Left renal vein is patent. No evidence for hydroureter. The urinary bladder appears normal for the degree of distention. Stomach/Bowel: Stomach is unremarkable. No gastric wall thickening. No evidence of outlet obstruction. Duodenum is normally positioned as is the ligament of Treitz. No small bowel wall thickening. No small bowel dilatation. The terminal ileum is normal. The appendix is normal. No gross colonic mass. No colonic wall thickening. Vascular/Lymphatic: No abdominal aortic aneurysm. 9 mm left para-aortic node on 66/18 is not enlarged by CT criteria but is suspicious for metastatic disease. No pelvic sidewall lymphadenopathy. Reproductive: The prostate gland and seminal vesicles are unremarkable. Other: No intraperitoneal free fluid.  Musculoskeletal: No worrisome lytic or sclerotic osseous abnormality. 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 IMPRESSION: 1. 7 cm heterogeneous mass lesion in the lower pole left kidney is compatible with renal cell carcinoma. Mass generates mass-effect on the left renal pelvis and proximal left ureter. Left renal vein patent. 2. Necrotic lymphadenopathy in the mediastinum and right suprahilar region is highly suspicious for metastatic disease. Bilateral pulmonary nodules also raise concern for metastatic involvement. 3. Right retrocrural lymphadenopathy with upper normal left para-aortic lymph node also suggest metastatic involvement. 4. 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 Electronically Signed   By: Misty Stanley M.D.   On: 05/03/2019 13:35   CT Renal Stone Study  Result Date: 05/02/2019 CLINICAL DATA:  Left flank pain, lower back pain EXAM: CT ABDOMEN AND PELVIS WITHOUT CONTRAST TECHNIQUE: Multidetector CT imaging of the abdomen and pelvis was performed following the standard protocol without IV contrast. COMPARISON:  None. FINDINGS: Lower chest: Lung bases are clear. No effusions. Heart is normal size. Hepatobiliary: No focal hepatic abnormality. Gallbladder unremarkable. Pancreas: No focal abnormality or ductal dilatation. Spleen: No focal abnormality.  Normal size. Adrenals/Urinary Tract: Large solid mass is seen in the lower pole of the left kidney measuring 7.4 x 6.9 cm. Central calcifications noted. Findings concerning for renal cell carcinoma. There is mild left hydronephrosis which may be related to mass effect from the mass on the adjacent proximal left ureter. High-density material is seen within the left renal collecting system and ureter compatible with blood. No hydronephrosis on the right. Adrenal glands are unremarkable. Bladder wall appears diffusely thickened. Stomach/Bowel: Normal appendix. Stomach, large and small bowel  grossly unremarkable. Vascular/Lymphatic: No evidence of aneurysm or adenopathy. Reproductive: Prostate enlargement. Other: 2 No free fluid or free air. Musculoskeletal: No acute bony abnormality. IMPRESSION: Large solid mass involving the lower pole of the left kidney concerning for renal cell carcinoma measuring up to 7.4 cm. Mild left hydronephrosis, possibly related to mass effect or blood clot within the left ureter. Diffusely thick walled bladder, likely related to prostate enlargement. Electronically Signed   By: Rolm Baptise M.D.   On: 05/02/2019 01:24    Assessment and plan-  Patient is a 59 y.o. 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  CT showed 6.4 x 6.7 x 7 cm left kidney mass, retroperitoneal lymphadenopathy as well as necrotic lymphadenopathy in the mediastinum and the right suprahilar region, bilateral lung nodules  #Hematuria with findings of left kidney mass.  Suspect RCC. Image findings were independently reviewed by me and discussed with patient and son at the bedside. Suspicion for RCC with local regional lymph node metastasis and lung/mediastinal/hilar involvement is high. ANCA vasculitis is in the differential diagnosis for his chest CT findings I discussed with patient and son about biopsy of mediastinal lymphadenopathy via bronchoscopy while he is off aspirin and Plavix and he agrees. Consulted pulmonology Dr. Lanney Gins for evaluation. Per urology, patient has agreed with cytoreductive nephrectomy due to risk of hematuria. Discussed plan with Dr. Erlene Quan, Dr. Roger Shelter via secure chat. Thank you for allowing me to participate in the care of this patient.   Earlie Server, MD, PhD Hematology Oncology [facility] at [facility] Pager- [phone] 05/04/2019

## 2019-05-04 NOTE — TOC Initial Note (Signed)
Transition of Care [facility]) - Initial/Assessment Note    Patient Details  Name: Mark Donaldson MRN: [id number] Date of Birth: 12-29-1960  Transition of Care [facility]) CM/SW Contact:    Beverly Sessions, RN Phone Number: 05/04/2019, 2:13 PM  Clinical Narrative:                 Patient admitted from home Assessment completed with patient via [place] interpreter   Patient lives at home with wife.  Son is currently staying with them as well PCP Beverlyn Roux at [facility] and uses their pharmacy.  Patient last seen 07/03/18  Patient denies issues obtaining medications  Patient sees cardiology at [facility] and is plugged in with their charity care program  Patient has reliable transportation   Patient and son notified that there is not am option for emergency insurance while the patient is at the hospital.  I have emailed the patient's son information on [place].    RNCM following for any discharge needs   Expected Discharge Plan: Home/Self Care Barriers to Discharge: Continued Medical Work up   Patient Goals and CMS Choice        Expected Discharge Plan and Services Expected Discharge Plan: Home/Self Care   Discharge Planning Services: CM Consult   Living arrangements for the past 2 months: Mobile Home                                      Prior Living Arrangements/Services Living arrangements for the past 2 months: Mobile Home Lives with:: Spouse, Adult Children Patient language and need for interpreter reviewed:: Yes Do you feel safe going back to the place where you live?: Yes      Need for Family Participation in Patient Care: Yes (Comment) Care giver support system in place?: Yes (comment)   Criminal Activity/Legal Involvement Pertinent to Current Situation/Hospitalization: No - Comment as needed  Activities of Daily Living Home Assistive Devices/Equipment: None ADL Screening (condition at time of admission) Patient's  cognitive ability adequate to safely complete daily activities?: Yes Is the patient deaf or have difficulty hearing?: No Does the patient have difficulty seeing, even when wearing glasses/contacts?: Yes Does the patient have difficulty concentrating, remembering, or making decisions?: Yes Patient able to express need for assistance with ADLs?: Yes Does the patient have difficulty dressing or bathing?: No Independently performs ADLs?: Yes (appropriate for developmental age) Does the patient have difficulty walking or climbing stairs?: No Weakness of Legs: None Weakness of Arms/Hands: Both  Permission Sought/Granted                  Emotional Assessment Appearance:: Appears stated age Attitude/Demeanor/Rapport: Gracious Affect (typically observed): Accepting Orientation: : Oriented to Self, Oriented to Place, Oriented to Situation, Oriented to  Time   Psych Involvement: No (comment)  Admission diagnosis:  Sepsis due to urinary tract infection ([place]) [A41.9, N39.0] Hematuria [R31.9] Malignant neoplasm of left kidney (HCC) [C64.2] Severe sepsis ([place]) [A41.9, R65.20] Patient Active Problem List   Diagnosis Date Noted  . Mediastinal lymphadenopathy   . Lung nodule   . CAD S/P percutaneous coronary angioplasty 05/02/2019  . Left renal mass 05/02/2019  . Hydronephrosis of left kidney 05/02/2019  . UTI (urinary tract infection) 05/02/2019  . AKI (acute kidney injury) ([place]) 05/02/2019  . Gross hematuria 05/02/2019  . Chronic diastolic CHF (congestive heart failure) ([place]) 05/02/2019  . Sepsis ([place]) 05/02/2019  .  Severe sepsis ([place]) 05/02/2019  . Preop cardiovascular exam    PCP:  Frazier Richards, MD Pharmacy:  No Pharmacies Listed    Social Determinants of Health (SDOH) Interventions    Readmission Risk Interventions No flowsheet data found.

## 2019-05-04 NOTE — Progress Notes (Signed)
*  PRELIMINARY RESULTS* Echocardiogram 2D Echocardiogram has been performed.  Sherrie Sport 05/04/2019, 1:04 PM

## 2019-05-05 ENCOUNTER — Encounter: Payer: Self-pay | Admitting: Internal Medicine

## 2019-05-05 ENCOUNTER — Inpatient Hospital Stay: Payer: Self-pay | Admitting: Anesthesiology

## 2019-05-05 ENCOUNTER — Encounter
Admission: EM | Disposition: A | Discharge status: Home / Self Care | Payer: Self-pay | Source: Home / Self Care | Attending: Family Medicine

## 2019-05-05 DIAGNOSIS — D4102 Neoplasm of uncertain behavior of left kidney: Secondary | ICD-10-CM

## 2019-05-05 HISTORY — PX: LAPAROSCOPIC NEPHRECTOMY, HAND ASSISTED: SHX1929

## 2019-05-05 LAB — BASIC METABOLIC PANEL
Anion gap: 8 (ref 5–15)
BUN: 11 mg/dL (ref 6–20)
CO2: 23 mmol/L (ref 22–32)
Calcium: 8.4 mg/dL — ABNORMAL LOW (ref 8.9–10.3)
Chloride: 111 mmol/L (ref 98–111)
Creatinine, Ser: 0.95 mg/dL (ref 0.61–1.24)
GFR calc Af Amer: >60 mL/min (ref >60)
GFR calc non Af Amer: >60 mL/min (ref >60)
Glucose, Bld: 99 mg/dL (ref 70–99)
Potassium: 3.8 mmol/L (ref 3.5–5.1)
Sodium: 142 mmol/L (ref 135–145)

## 2019-05-05 SURGERY — NEPHRECTOMY, HAND-ASSISTED, LAPAROSCOPIC
Anesthesia: General | Laterality: Left

## 2019-05-05 MED ORDER — FENTANYL CITRATE (PF) 100 MCG/2ML IJ SOLN
INTRAMUSCULAR | Status: AC
  Administered 2019-05-05: 12:00:00 25 ug via INTRAVENOUS
  Filled 2019-05-05: qty 2

## 2019-05-05 MED ORDER — EPHEDRINE SULFATE 50 MG/ML IJ SOLN
INTRAMUSCULAR | Status: AC
  Administered 2019-05-05: 5 mg via INTRAVENOUS
  Filled 2019-05-05: qty 1

## 2019-05-05 MED ORDER — DEXAMETHASONE SODIUM PHOSPHATE 10 MG/ML IJ SOLN
INTRAMUSCULAR | Status: AC
  Filled 2019-05-05: qty 1

## 2019-05-05 MED ORDER — FENTANYL CITRATE (PF) 250 MCG/5ML IJ SOLN
INTRAMUSCULAR | Status: AC
  Filled 2019-05-05: qty 5

## 2019-05-05 MED ORDER — ONDANSETRON HCL 4 MG/2ML IJ SOLN
INTRAMUSCULAR | Status: AC
  Filled 2019-05-05: qty 2

## 2019-05-05 MED ORDER — FENTANYL CITRATE (PF) 100 MCG/2ML IJ SOLN
25.0000 ug | INTRAMUSCULAR | Status: AC | PRN
  Administered 2019-05-05 (×4): 25 ug via INTRAVENOUS

## 2019-05-05 MED ORDER — ONDANSETRON HCL 4 MG/2ML IJ SOLN: 4.0000 mg | Freq: Once | INTRAMUSCULAR | Status: DC | PRN

## 2019-05-05 MED ORDER — FENTANYL CITRATE (PF) 100 MCG/2ML IJ SOLN
INTRAMUSCULAR | Status: DC | PRN
  Administered 2019-05-05 (×2): 50 ug via INTRAVENOUS
  Administered 2019-05-05: 100 ug via INTRAVENOUS
  Administered 2019-05-05: 50 ug via INTRAVENOUS

## 2019-05-05 MED ORDER — PHENYLEPHRINE HCL (PRESSORS) 10 MG/ML IV SOLN
INTRAVENOUS | Status: AC
  Filled 2019-05-05: qty 1

## 2019-05-05 MED ORDER — PHENYLEPHRINE HCL (PRESSORS) 10 MG/ML IV SOLN
INTRAVENOUS | Status: DC | PRN
  Administered 2019-05-05 (×2): 200 ug via INTRAVENOUS
  Administered 2019-05-05: 150 ug via INTRAVENOUS
  Administered 2019-05-05 (×2): 200 ug via INTRAVENOUS
  Administered 2019-05-05: 100 ug via INTRAVENOUS

## 2019-05-05 MED ORDER — ACETAMINOPHEN 10 MG/ML IV SOLN
INTRAVENOUS | Status: DC | PRN
  Administered 2019-05-05: 1000 mg via INTRAVENOUS

## 2019-05-05 MED ORDER — ACETAMINOPHEN 10 MG/ML IV SOLN: 1000.0000 mg | Freq: Once | INTRAVENOUS | Status: DC | PRN

## 2019-05-05 MED ORDER — THROMBIN 5000 UNITS EX SOLR
CUTANEOUS | Status: AC
  Filled 2019-05-05: qty 5000

## 2019-05-05 MED ORDER — SODIUM CHLORIDE FLUSH 0.9 % IV SOLN
INTRAVENOUS | Status: DC | PRN
  Administered 2019-05-05: 10:00:00 100 mL

## 2019-05-05 MED ORDER — MIDAZOLAM HCL 2 MG/2ML IJ SOLN
INTRAMUSCULAR | Status: DC | PRN
  Administered 2019-05-05: 2 mg via INTRAVENOUS

## 2019-05-05 MED ORDER — EPHEDRINE SULFATE 50 MG/ML IJ SOLN: 5.0000 mg | Freq: Once | INTRAMUSCULAR | Status: AC

## 2019-05-05 MED ORDER — SUCCINYLCHOLINE CHLORIDE 20 MG/ML IJ SOLN
INTRAMUSCULAR | Status: DC | PRN
  Administered 2019-05-05: 140 mg via INTRAVENOUS

## 2019-05-05 MED ORDER — ACETAMINOPHEN 10 MG/ML IV SOLN
INTRAVENOUS | Status: AC
  Filled 2019-05-05: qty 100

## 2019-05-05 MED ORDER — SUCCINYLCHOLINE CHLORIDE 200 MG/10ML IV SOSY
PREFILLED_SYRINGE | INTRAVENOUS | Status: AC
  Filled 2019-05-05: qty 10

## 2019-05-05 MED ORDER — MIDAZOLAM HCL 2 MG/2ML IJ SOLN
INTRAMUSCULAR | Status: AC
  Filled 2019-05-05: qty 2

## 2019-05-05 MED ORDER — GLYCOPYRROLATE 0.2 MG/ML IJ SOLN
INTRAMUSCULAR | Status: AC
  Filled 2019-05-05: qty 1

## 2019-05-05 MED ORDER — LIDOCAINE HCL (PF) 2 % IJ SOLN
INTRAMUSCULAR | Status: AC
  Filled 2019-05-05: qty 5

## 2019-05-05 MED ORDER — HYDROMORPHONE HCL 1 MG/ML IJ SOLN
INTRAMUSCULAR | Status: DC | PRN
  Administered 2019-05-05: .4 mg via INTRAVENOUS
  Administered 2019-05-05 (×3): .2 mg via INTRAVENOUS

## 2019-05-05 MED ORDER — CHLORHEXIDINE GLUCONATE CLOTH 2 % EX PADS
6.0000 | MEDICATED_PAD | CUTANEOUS | Status: AC
  Administered 2019-05-05: 6 via TOPICAL

## 2019-05-05 MED ORDER — DEXAMETHASONE SODIUM PHOSPHATE 10 MG/ML IJ SOLN
INTRAMUSCULAR | Status: DC | PRN
  Administered 2019-05-05: 10 mg via INTRAVENOUS

## 2019-05-05 MED ORDER — PROPOFOL 10 MG/ML IV BOLUS
INTRAVENOUS | Status: DC | PRN
  Administered 2019-05-05: 150 mg via INTRAVENOUS

## 2019-05-05 MED ORDER — SUGAMMADEX SODIUM 500 MG/5ML IV SOLN
INTRAVENOUS | Status: DC | PRN
  Administered 2019-05-05: 400 mg via INTRAVENOUS

## 2019-05-05 MED ORDER — LIDOCAINE HCL (CARDIAC) PF 100 MG/5ML IV SOSY
PREFILLED_SYRINGE | INTRAVENOUS | Status: DC | PRN
  Administered 2019-05-05: 80 mg via INTRAVENOUS

## 2019-05-05 MED ORDER — BUPIVACAINE HCL (PF) 0.5 % IJ SOLN
INTRAMUSCULAR | Status: AC
  Filled 2019-05-05: qty 30

## 2019-05-05 MED ORDER — HYDROMORPHONE HCL 1 MG/ML IJ SOLN
INTRAMUSCULAR | Status: AC
  Filled 2019-05-05: qty 1

## 2019-05-05 MED ORDER — ROCURONIUM BROMIDE 100 MG/10ML IV SOLN
INTRAVENOUS | Status: DC | PRN
  Administered 2019-05-05: 40 mg via INTRAVENOUS
  Administered 2019-05-05: 10 mg via INTRAVENOUS
  Administered 2019-05-05: 5 mg via INTRAVENOUS

## 2019-05-05 MED ORDER — SODIUM CHLORIDE FLUSH 0.9 % IV SOLN
INTRAVENOUS | Status: AC
  Filled 2019-05-05: qty 10

## 2019-05-05 MED ORDER — BUPIVACAINE LIPOSOME 1.3 % IJ SUSP
INTRAMUSCULAR | Status: AC
  Filled 2019-05-05: qty 20

## 2019-05-05 MED ORDER — CHLORHEXIDINE GLUCONATE CLOTH 2 % EX PADS: 6.0000 | MEDICATED_PAD | Freq: Every day | CUTANEOUS | Status: DC

## 2019-05-05 MED ORDER — CEFAZOLIN SODIUM-DEXTROSE 2-4 GM/100ML-% IV SOLN
2.0000 g | INTRAVENOUS | Status: AC
  Administered 2019-05-05: 2 g via INTRAVENOUS
  Filled 2019-05-05: qty 100

## 2019-05-05 MED ORDER — PROPOFOL 10 MG/ML IV BOLUS
INTRAVENOUS | Status: AC
  Filled 2019-05-05: qty 20

## 2019-05-05 MED ORDER — GLYCOPYRROLATE 0.2 MG/ML IJ SOLN
INTRAMUSCULAR | Status: DC | PRN
  Administered 2019-05-05: .2 mg via INTRAVENOUS

## 2019-05-05 MED ORDER — SODIUM CHLORIDE (PF) 0.9 % IJ SOLN
INTRAMUSCULAR | Status: AC
  Filled 2019-05-05: qty 50

## 2019-05-05 MED ORDER — SODIUM CHLORIDE 0.9 % IV BOLUS
250.0000 mL | Freq: Once | INTRAVENOUS | Status: AC
  Administered 2019-05-05: 250 mL via INTRAVENOUS

## 2019-05-05 MED ORDER — DEXMEDETOMIDINE HCL IN NACL 80 MCG/20ML IV SOLN
INTRAVENOUS | Status: AC
  Filled 2019-05-05: qty 20

## 2019-05-05 MED ORDER — DEXMEDETOMIDINE HCL 200 MCG/2ML IV SOLN
INTRAVENOUS | Status: DC | PRN
  Administered 2019-05-05: 8 ug via INTRAVENOUS
  Administered 2019-05-05 (×3): 4 ug via INTRAVENOUS

## 2019-05-05 MED ORDER — SODIUM CHLORIDE 0.9 % IV SOLN
INTRAVENOUS | Status: DC | PRN
  Administered 2019-05-05: 30 ug/min via INTRAVENOUS

## 2019-05-05 MED ORDER — THROMBIN 5000 UNITS EX SOLR
CUTANEOUS | Status: DC | PRN
  Administered 2019-05-05: 5000 [IU] via TOPICAL

## 2019-05-05 MED ORDER — ONDANSETRON HCL 4 MG/2ML IJ SOLN
INTRAMUSCULAR | Status: DC | PRN
  Administered 2019-05-05: 4 mg via INTRAVENOUS

## 2019-05-05 MED ORDER — CEFAZOLIN SODIUM-DEXTROSE 2-4 GM/100ML-% IV SOLN
INTRAVENOUS | Status: AC
  Filled 2019-05-05: qty 100

## 2019-05-05 MED ORDER — ROCURONIUM BROMIDE 10 MG/ML (PF) SYRINGE
PREFILLED_SYRINGE | INTRAVENOUS | Status: AC
  Filled 2019-05-05: qty 10

## 2019-05-05 MED ORDER — LACTATED RINGERS IV SOLN: INTRAVENOUS | Status: DC | PRN

## 2019-05-05 MED ORDER — OXYCODONE HCL 5 MG PO TABS: 5.0000 mg | ORAL_TABLET | Freq: Once | ORAL | Status: DC | PRN

## 2019-05-05 MED ORDER — OXYCODONE HCL 5 MG/5ML PO SOLN: 5.0000 mg | Freq: Once | ORAL | Status: DC | PRN

## 2019-05-05 SURGICAL SUPPLY — 90 items
"PENCIL ELECTRO HAND CTR " (MISCELLANEOUS) ×1 IMPLANT
ANCHOR TIS RET SYS 1550ML (BAG) IMPLANT
APPLICATOR SURGIFLO ENDO (HEMOSTASIS) IMPLANT
APPLIER CLIP ROT 10 11.4 M/L (STAPLE)
APPLIER CLIP ROT 13.4 12 LRG (CLIP)
CANISTER SUCT 1200ML W/VALVE (MISCELLANEOUS) ×3 IMPLANT
CHLORAPREP W/TINT 26 (MISCELLANEOUS) ×3 IMPLANT
CLEANER CAUTERY TIP 5X5 PAD (MISCELLANEOUS) ×1 IMPLANT
CLIP APPLIE ROT 10 11.4 M/L (STAPLE) IMPLANT
CLIP APPLIE ROT 13.4 12 LRG (CLIP) IMPLANT
CLIP VESOLOCK LG 6/CT PURPLE (CLIP) ×3 IMPLANT
CLOSURE WOUND 1/2 X4 (GAUZE/BANDAGES/DRESSINGS)
COVER WAND RF STERILE (DRAPES) ×3 IMPLANT
CUTTER ECHEON FLEX ENDO 45 340 (ENDOMECHANICALS) IMPLANT
DEFOGGER SCOPE WARMER CLEARIFY (MISCELLANEOUS) ×3 IMPLANT
DERMABOND ADVANCED (GAUZE/BANDAGES/DRESSINGS) ×4
DERMABOND ADVANCED .7 DNX12 (GAUZE/BANDAGES/DRESSINGS) ×1 IMPLANT
DISSECTOR KITTNER STICK (MISCELLANEOUS) ×1 IMPLANT
DISSECTORS/KITTNER STICK (MISCELLANEOUS)
DRAPE INCISE IOBAN 66X45 STRL (DRAPES) ×3 IMPLANT
DRAPE STERI POUCH LG 24X46 STR (DRAPES) ×3 IMPLANT
DRAPE SURG 17X11 SM STRL (DRAPES) ×12 IMPLANT
DRSG TEGADERM 2-3/8X2-3/4 SM (GAUZE/BANDAGES/DRESSINGS) IMPLANT
DRSG TEGADERM 4X4.75 (GAUZE/BANDAGES/DRESSINGS) IMPLANT
DRSG TELFA 3X8 NADH (GAUZE/BANDAGES/DRESSINGS) IMPLANT
ELECT REM PT RETURN 9FT ADLT (ELECTROSURGICAL) ×3
ELECTRODE REM PT RTRN 9FT ADLT (ELECTROSURGICAL) ×1 IMPLANT
GLOVE BIO SURGEON STRL SZ 6.5 (GLOVE) ×4 IMPLANT
GLOVE BIO SURGEONS STRL SZ 6.5 (GLOVE) ×2
GLOVE INDICATOR 6.5 STRL GRN (GLOVE) ×3 IMPLANT
GOWN STRL REUS W/ TWL LRG LVL3 (GOWN DISPOSABLE) ×3 IMPLANT
GOWN STRL REUS W/TWL LRG LVL3 (GOWN DISPOSABLE) ×6
GRASPER SUT TROCAR 14GX15 (MISCELLANEOUS) ×3 IMPLANT
HANDLE YANKAUER SUCT BULB TIP (MISCELLANEOUS) ×3 IMPLANT
HEMOSTAT SURGICEL 2X14 (HEMOSTASIS) IMPLANT
HOLDER FOLEY CATH W/STRAP (MISCELLANEOUS) ×3 IMPLANT
IRRIGATION STRYKERFLOW (MISCELLANEOUS) ×1 IMPLANT
IRRIGATOR STRYKERFLOW (MISCELLANEOUS) ×3
IV NS 1000ML (IV SOLUTION) ×2
IV NS 1000ML BAXH (IV SOLUTION) ×1 IMPLANT
KIT PINK PAD W/HEAD ARE REST (MISCELLANEOUS) ×3
KIT PINK PAD W/HEAD ARM REST (MISCELLANEOUS) ×1 IMPLANT
KIT TURNOVER KIT A (KITS) ×3 IMPLANT
L-HOOK LAP DISP 36CM (ELECTROSURGICAL) ×3
LABEL OR SOLS (LABEL) ×3 IMPLANT
LHOOK LAP DISP 36CM (ELECTROSURGICAL) ×1 IMPLANT
LIGASURE LAP ATLAS 10MM 37CM (INSTRUMENTS) ×3 IMPLANT
LOOP RED MAXI  1X406MM (MISCELLANEOUS) ×2
LOOP VESSEL MAXI 1X406 RED (MISCELLANEOUS) ×1 IMPLANT
NDL HYPO 21X1.5 SAFETY (NEEDLE) ×1 IMPLANT
NDL INSUFFLATION 14GA 120MM (NEEDLE) ×1 IMPLANT
NEEDLE HYPO 21X1.5 SAFETY (NEEDLE) ×3 IMPLANT
NEEDLE INSUFFLATION 14GA 120MM (NEEDLE) ×3 IMPLANT
PACK LAP CHOLECYSTECTOMY (MISCELLANEOUS) ×3 IMPLANT
PAD CLEANER CAUTERY TIP 5X5 (MISCELLANEOUS) ×2
PAD DRESSING TELFA 3X8 NADH (GAUZE/BANDAGES/DRESSINGS) IMPLANT
PENCIL ELECTRO HAND CTR (MISCELLANEOUS) ×3 IMPLANT
RELOAD STAPLE 35X2.5 WHT THIN (STAPLE) IMPLANT
RELOAD STAPLE 60 2.6 WHT THN (STAPLE) IMPLANT
RELOAD STAPLER WHITE 60MM (STAPLE) ×5 IMPLANT
RELOAD WH ECHELON 45 (STAPLE) IMPLANT
SCISSORS METZENBAUM CVD 33 (INSTRUMENTS) ×3 IMPLANT
SET TUBE SMOKE EVAC HIGH FLOW (TUBING) ×1 IMPLANT
SPOGE SURGIFLO 8M (HEMOSTASIS)
SPONGE LAP 18X18 RF (DISPOSABLE) ×5 IMPLANT
SPONGE SURGIFLO 8M (HEMOSTASIS) IMPLANT
STAPLE ECHEON FLEX 60 POW ENDO (STAPLE) ×2 IMPLANT
STAPLE RELOAD 2.5MM WHITE (STAPLE) IMPLANT
STAPLER RELOAD WHITE 60MM (STAPLE) ×15
STAPLER SKIN PROX 35W (STAPLE) ×3 IMPLANT
STAPLER VASCULAR ECHELON 35 (CUTTER) IMPLANT
STRIP CLOSURE SKIN 1/2X4 (GAUZE/BANDAGES/DRESSINGS) IMPLANT
SUT CHROMIC 0 CT 1 (SUTURE) IMPLANT
SUT MNCRL AB 4-0 PS2 18 (SUTURE) ×6 IMPLANT
SUT PDS AB 1 CT1 36 (SUTURE) ×12 IMPLANT
SUT PDS AB 1 TP1 96 (SUTURE) ×2 IMPLANT
SUT VIC AB 0 CT1 36 (SUTURE) ×6 IMPLANT
SUT VIC AB 1 CT1 36 (SUTURE) IMPLANT
SUT VIC AB 2-0 SH 27 (SUTURE)
SUT VIC AB 2-0 SH 27XBRD (SUTURE) IMPLANT
SUT VIC AB 2-0 UR6 27 (SUTURE) IMPLANT
SUT VIC AB 4-0 FS2 27 (SUTURE) ×3 IMPLANT
SUT VICRYL 0 AB UR-6 (SUTURE) ×3 IMPLANT
SYR 20ML LL LF (SYRINGE) ×2 IMPLANT
SYS LAPSCP GELPORT 120MM (MISCELLANEOUS) ×3
SYSTEM LAPSCP GELPORT 120MM (MISCELLANEOUS) ×1 IMPLANT
TRAY FOLEY MTR SLVR 16FR STAT (SET/KITS/TRAYS/PACK) ×3 IMPLANT
TROCAR ENDOPATH XCEL 12X100 BL (ENDOMECHANICALS) ×3 IMPLANT
TROCAR XCEL 12X100 BLDLESS (ENDOMECHANICALS) ×3 IMPLANT
TROCAR XCEL NON-BLD 5MMX100MML (ENDOMECHANICALS) ×3 IMPLANT

## 2019-05-05 NOTE — Progress Notes (Signed)
Report given to OR this AM. Patient blood pressure continued to be high after 10mg  dose of hydralazine.  Patient appeared to be tearful. OR has taken patient off the floor at this point.

## 2019-05-05 NOTE — Anesthesia Postprocedure Evaluation (Signed)
Anesthesia Post Note  Patient: [place]  Procedure(s) Performed: HAND ASSISTED LAPAROSCOPIC NEPHRECTOMY (Left )  Patient location during evaluation: PACU Anesthesia Type: General Level of consciousness: awake and alert Pain management: pain level controlled Vital Signs Assessment: post-procedure vital signs reviewed and stable Respiratory status: spontaneous breathing, nonlabored ventilation, respiratory function stable and patient connected to nasal cannula oxygen Cardiovascular status: blood pressure returned to baseline and stable Postop Assessment: no apparent nausea or vomiting Anesthetic complications: no Comments: Required some BP support initially, stabilized prior to DC from PACU     Last Vitals:  Vitals:   05/05/19 1354 05/05/19 1437  BP: 119/79 119/82  Pulse: 81 77  Resp:  16  Temp: 36.5 C 36.7 C  SpO2: 98% 99%    Last Pain:  Vitals:   05/05/19 1437  TempSrc: Oral  PainSc:                  Alphonsus Sias

## 2019-05-05 NOTE — Consult Note (Signed)
Pulmonary Medicine          Date: 05/05/2019,   MRN# [id number] Mark Donaldson 1960/07/16     AdmissionWeight: 71.7 kg                 CurrentWeight: 69.9 kg      CHIEF COMPLAINT:   Metastatic cancer   HISTORY OF PRESENT ILLNESS    59 y.o. male with PMH listed as below who found to have left renal mass suspected malignant s/p Left nephrectomy. Patient is a never smoker,he works in [employer] with no known carcinogen exposure,  he has family hx of cancer "blood cancer" patients mother.   He had additional imaging noted in pictorial documentation section below.  Findings including Right lung perifissural nodules as well as hilar and mediastinal lymphadenopathy suspicious for metastatic disease.  Pulmonary consultation placed for bronchoscopy as well as EBUS assisted hilar and mediastinal lymph node biopsies.  Patient was evaluated at bedside he is currently POD-0 and is sleepy on narcotics for pain. He speaks spanish.  Family is at beside including Son Holley Raring), wife Ranard Harte), they share that they are aware of chest lesion.  We reviewed CT images together, family was unaware of lymphadenopathy.  We discussed risk/benefit of procedure and family whishes to think about procedure and get back to me.  In the interim we will place surgical case request for next available and will await family goals of care.    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[place])    CAD (coronary artery disease)    Essential hypertension    HFrEF (heart failure with reduced ejection fraction) (HCC)    Hyperlipidemia LDL goal <70    Ischemic cardiomyopathy    Renal disorder      SURGICAL HISTORY   Past Surgical History:  Procedure Laterality Date   CARDIAC SURGERY     cardiac cath with 2 stent placement   LAPAROSCOPIC NEPHRECTOMY, HAND ASSISTED Left 05/05/2019   Procedure: HAND ASSISTED LAPAROSCOPIC  NEPHRECTOMY;  Surgeon: Hollice Espy, MD;  Location: [facility] ORS;  Service: Urology;  Laterality: Left;     FAMILY HISTORY   Family History  Problem Relation Age of Onset   Cancer Mother    Blindness Brother      SOCIAL HISTORY   Social History   Tobacco Use   Smoking status: Never Smoker   Smokeless tobacco: Never Used  Substance Use Topics   Alcohol use: Yes   Drug use: Not on file     MEDICATIONS    Home Medication:    Current Medication:  Current Facility-Administered Medications:    0.9 %  sodium chloride infusion, , Intravenous, Continuous, Judd Gaudier V, MD, Last Rate: 125 mL/hr at 05/05/19 1402, New Bag at 05/05/19 1402   0.9 %  sodium chloride infusion, , Intravenous, PRN, Dhungel, Nishant, MD, Stopped at 05/05/19 0615   acetaminophen (TYLENOL) tablet 650 mg, 650 mg, Oral, Q6H PRN, 650 mg at 05/04/19 2215 **OR** acetaminophen (TYLENOL) suppository 650 mg, 650 mg, Rectal, Q6H PRN, Athena Masse, MD   atorvastatin (LIPITOR) tablet 80 mg, 80 mg, Oral, QHS, Dhungel, Nishant, MD, 80 mg at 05/04/19 2215   cefTRIAXone (ROCEPHIN) 1 g in sodium chloride 0.9 % 100 mL IVPB, 1 g, Intravenous, Q24H, Judd Gaudier V, MD, Last Rate: 200 mL/hr at 05/05/19 0318, 1 g at 05/05/19 0318   Chlorhexidine Gluconate Cloth 2 % PADS 6 each,  6 each, Topical, Q0600, Shahmehdi, Seyed A, MD   hydrALAZINE (APRESOLINE) injection 10 mg, 10 mg, Intravenous, Q6H PRN, Dhungel, Nishant, MD, 10 mg at 05/05/19 0611   metoprolol succinate (TOPROL-XL) 24 hr tablet 150 mg, 150 mg, Oral, Daily, Dhungel, Nishant, MD, 150 mg at 05/04/19 1020   morphine 4 MG/ML injection 4 mg, 4 mg, Intravenous, Q2H PRN, Judd Gaudier V, MD, 4 mg at 05/05/19 1354   ondansetron (ZOFRAN) tablet 4 mg, 4 mg, Oral, Q6H PRN **OR** ondansetron (ZOFRAN) injection 4 mg, 4 mg, Intravenous, Q6H PRN, Athena Masse, MD    ALLERGIES   Patient has no known allergies.     REVIEW OF SYSTEMS    Review of  Systems:  Gen:  Denies  fever, sweats, chills weigh loss  HEENT: Denies blurred vision, double vision, ear pain, eye pain, hearing loss, nose bleeds, sore throat Cardiac:  No dizziness, chest pain or heaviness, chest tightness,edema Resp:   Denies cough or sputum porduction, shortness of breath,wheezing, hemoptysis,  Gi: Denies swallowing difficulty, stomach pain, nausea or vomiting, diarrhea, constipation, bowel incontinence Gu:  Denies bladder incontinence, burning urine Ext:   Denies Joint pain, stiffness or swelling Skin: Denies  skin rash, easy bruising or bleeding or hives Endoc:  Denies polyuria, polydipsia , polyphagia or weight change Psych:   Denies depression, insomnia or hallucinations   Other:  All other systems negative   VS: BP (!) 131/93    Pulse 85    Temp 98.4 F (36.9 C) (Oral)    Resp 16    Ht 5\' 5"  (1.651 m)    Wt 69.9 kg    SpO2 99%    BMI 25.64 kg/m      PHYSICAL EXAM    GENERAL:NAD, no fevers, chills, no weakness no fatigue HEAD: Normocephalic, atraumatic.  EYES: Pupils equal, round, reactive to light. Extraocular muscles intact. No scleral icterus.  MOUTH: Moist mucosal membrane. Dentition intact. No abscess noted.  EAR, NOSE, THROAT: Clear without exudates. No external lesions.  NECK: Supple. No thyromegaly. No nodules. No JVD.  PULMONARY: Diffuse coarse rhonchi right sided +wheezes CARDIOVASCULAR: S1 and S2. Regular rate and rhythm. No murmurs, rubs, or gallops. No edema. Pedal pulses 2+ bilaterally.  GASTROINTESTINAL: Soft, nontender, nondistended. No masses. Positive bowel sounds. No hepatosplenomegaly.  MUSCULOSKELETAL: No swelling, clubbing, or edema. Range of motion full in all extremities.  NEUROLOGIC: Cranial nerves II through XII are intact. No gross focal neurological deficits. Sensation intact. Reflexes intact.  SKIN: No ulceration, lesions, rashes, or cyanosis. Skin warm and dry. Turgor intact.  PSYCHIATRIC: Mood, affect within normal limits.  The patient is awake, alert and oriented x 3. Insight, judgment intact.       IMAGING    CT ABDOMEN PELVIS W WO CONTRAST  Result Date: 05/03/2019 CLINICAL DATA:  Renal mass. EXAM: CT CHEST WITH CONTRAST CT ABDOMEN AND PELVIS WITH AND WITHOUT CONTRAST TECHNIQUE: Multidetector CT imaging of the chest was performed during intravenous contrast administration. Multidetector CT imaging of the abdomen and pelvis was performed following the standard protocol before and during bolus administration of intravenous contrast. CONTRAST:  189mL OMNIPAQUE IOHEXOL 300 MG/ML  SOLN COMPARISON:  05/02/2019 CT stone study. FINDINGS: CT CHEST FINDINGS Cardiovascular: The heart size is normal. No substantial pericardial effusion. Coronary artery calcification is evident. No thoracic aortic aneurysm. Mediastinum/Nodes: Necrotic lymph nodes are identified in the right mediastinum along the pleural surface and right paratracheal space. Nodal conglomeration to the right of the trachea measures 3.5 x 1.9  cm on 22/3. No left hilar lymphadenopathy. The esophagus has normal imaging features. There is no axillary lymphadenopathy. 15 mm short axis right retrocrural node is visible on 34/18. Lungs/Pleura: 1.7 cm perifissural nodule noted on the minor fissure (82/5). 8 mm posterior left lower lobe nodule seen on 78/5. 5 mm medial right upper lobe nodule visible on 57/5. Dependent atelectasis noted in both lower lobes. Tiny bilateral pleural effusions evident. Musculoskeletal: No worrisome lytic or sclerotic osseous abnormality. CT ABDOMEN AND PELVIS FINDINGS Hepatobiliary: No suspicious focal abnormality within the liver parenchyma. There is no evidence for gallstones, gallbladder wall thickening, or pericholecystic fluid. No intrahepatic or extrahepatic biliary dilation. Pancreas: No focal mass lesion. No dilatation of the main duct. No intraparenchymal cyst. No peripancreatic edema. Spleen: No splenomegaly. No focal mass lesion.  Adrenals/Urinary Tract: No adrenal nodule or mass. Right kidney unremarkable. 6.4 x 6.7 x 7.0 cm heterogeneously enhancing mass is identified in the lower pole left kidney. Lesion extends into the central sinus fat and displaces the renal pelvis and proximal ureter posteriorly. Left renal vein is patent. No evidence for hydroureter. The urinary bladder appears normal for the degree of distention. Stomach/Bowel: Stomach is unremarkable. No gastric wall thickening. No evidence of outlet obstruction. Duodenum is normally positioned as is the ligament of Treitz. No small bowel wall thickening. No small bowel dilatation. The terminal ileum is normal. The appendix is normal. No gross colonic mass. No colonic wall thickening. Vascular/Lymphatic: No abdominal aortic aneurysm. 9 mm left para-aortic node on 66/18 is not enlarged by CT criteria but is suspicious for metastatic disease. No pelvic sidewall lymphadenopathy. Reproductive: The prostate gland and seminal vesicles are unremarkable. Other: No intraperitoneal free fluid. Musculoskeletal: No worrisome lytic or sclerotic osseous abnormality. 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 IMPRESSION: 1. 7 cm heterogeneous mass lesion in the lower pole left kidney is compatible with renal cell carcinoma. Mass generates mass-effect on the left renal pelvis and proximal left ureter. Left renal vein patent. 2. Necrotic lymphadenopathy in the mediastinum and right suprahilar region is highly suspicious for metastatic disease. Bilateral pulmonary nodules also raise concern for metastatic involvement. 3. Right retrocrural lymphadenopathy with upper normal left para-aortic lymph node also suggest metastatic involvement. 4. 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 Electronically Signed   By: Misty Stanley M.D.   On: 05/03/2019 13:35   CT CHEST W CONTRAST  Result Date: 05/03/2019 CLINICAL DATA:  Renal mass. EXAM: CT  CHEST WITH CONTRAST CT ABDOMEN AND PELVIS WITH AND WITHOUT CONTRAST TECHNIQUE: Multidetector CT imaging of the chest was performed during intravenous contrast administration. Multidetector CT imaging of the abdomen and pelvis was performed following the standard protocol before and during bolus administration of intravenous contrast. CONTRAST:  176mL OMNIPAQUE IOHEXOL 300 MG/ML  SOLN COMPARISON:  05/02/2019 CT stone study. FINDINGS: CT CHEST FINDINGS Cardiovascular: The heart size is normal. No substantial pericardial effusion. Coronary artery calcification is evident. No thoracic aortic aneurysm. Mediastinum/Nodes: Necrotic lymph nodes are identified in the right mediastinum along the pleural surface and right paratracheal space. Nodal conglomeration to the right of the trachea measures 3.5 x 1.9 cm on 22/3. No left hilar lymphadenopathy. The esophagus has normal imaging features. There is no axillary lymphadenopathy. 15 mm short axis right retrocrural node is visible on 34/18. Lungs/Pleura: 1.7 cm perifissural nodule noted on the minor fissure (82/5). 8 mm posterior left lower lobe nodule seen on 78/5. 5 mm medial right upper lobe nodule visible on  57/5. Dependent atelectasis noted in both lower lobes. Tiny bilateral pleural effusions evident. Musculoskeletal: No worrisome lytic or sclerotic osseous abnormality. CT ABDOMEN AND PELVIS FINDINGS Hepatobiliary: No suspicious focal abnormality within the liver parenchyma. There is no evidence for gallstones, gallbladder wall thickening, or pericholecystic fluid. No intrahepatic or extrahepatic biliary dilation. Pancreas: No focal mass lesion. No dilatation of the main duct. No intraparenchymal cyst. No peripancreatic edema. Spleen: No splenomegaly. No focal mass lesion. Adrenals/Urinary Tract: No adrenal nodule or mass. Right kidney unremarkable. 6.4 x 6.7 x 7.0 cm heterogeneously enhancing mass is identified in the lower pole left kidney. Lesion extends into the  central sinus fat and displaces the renal pelvis and proximal ureter posteriorly. Left renal vein is patent. No evidence for hydroureter. The urinary bladder appears normal for the degree of distention. Stomach/Bowel: Stomach is unremarkable. No gastric wall thickening. No evidence of outlet obstruction. Duodenum is normally positioned as is the ligament of Treitz. No small bowel wall thickening. No small bowel dilatation. The terminal ileum is normal. The appendix is normal. No gross colonic mass. No colonic wall thickening. Vascular/Lymphatic: No abdominal aortic aneurysm. 9 mm left para-aortic node on 66/18 is not enlarged by CT criteria but is suspicious for metastatic disease. No pelvic sidewall lymphadenopathy. Reproductive: The prostate gland and seminal vesicles are unremarkable. Other: No intraperitoneal free fluid. Musculoskeletal: No worrisome lytic or sclerotic osseous abnormality. 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 IMPRESSION: 1. 7 cm heterogeneous mass lesion in the lower pole left kidney is compatible with renal cell carcinoma. Mass generates mass-effect on the left renal pelvis and proximal left ureter. Left renal vein patent. 2. Necrotic lymphadenopathy in the mediastinum and right suprahilar region is highly suspicious for metastatic disease. Bilateral pulmonary nodules also raise concern for metastatic involvement. 3. Right retrocrural lymphadenopathy with upper normal left para-aortic lymph node also suggest metastatic involvement. 4. 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 Electronically Signed   By: Misty Stanley M.D.   On: 05/03/2019 13:35   ECHOCARDIOGRAM COMPLETE  Result Date: 05/04/2019    ECHOCARDIOGRAM REPORT   Patient Name:   Mark Donaldson Date of Exam: 05/04/2019 Medical Rec #:  [id number]                Height:       65.0 in Accession #:    [id number]               Weight:       154.1 lb Date of Birth:   08-24-1960               BSA:          1.771 m Patient Age:    19 years                 BP:           153/88 mmHg Patient Gender: M                        HR:           75 bpm. Exam Location:  [facility] Procedure: 2D Echo, Cardiac Doppler and Color Doppler Indications:     CHF- 428.0  History:         Patient has no prior history of Echocardiogram examinations.                  Risk Factors:Hypertension. Ischemic cardiomyopathy.  Sonographer:     Sherrie Sport RDCS (AE) Referring Phys:  Lerry Liner [facility] Diagnosing Phys: Kate Sable MD IMPRESSIONS  1. Left ventricular ejection fraction, by estimation, is 60 to 65%. The left ventricle has normal function. The left ventricle has no regional wall motion abnormalities. There is mild left ventricular hypertrophy. Left ventricular diastolic parameters are consistent with Grade I diastolic dysfunction (impaired relaxation).  2. Right ventricular systolic function is normal. The right ventricular size is normal.  3. Left atrial size was mildly dilated.  4. The mitral valve is normal in structure. Trivial mitral valve regurgitation.  5. The aortic valve is tricuspid. Aortic valve regurgitation is not visualized. Mild aortic valve sclerosis is present, with no evidence of aortic valve stenosis. FINDINGS  Left Ventricle: Left ventricular ejection fraction, by estimation, is 60 to 65%. The left ventricle has normal function. The left ventricle has no regional wall motion abnormalities. The left ventricular internal cavity size was normal in size. There is  mild left ventricular hypertrophy. Left ventricular diastolic parameters are consistent with Grade I diastolic dysfunction (impaired relaxation). Right Ventricle: The right ventricular size is normal. Right vetricular wall thickness was not assessed. Right ventricular systolic function is normal. Left Atrium: Left atrial size was mildly dilated. Right Atrium: Right atrial size was normal in size. Pericardium: There is no  evidence of pericardial effusion. Mitral Valve: The mitral valve is normal in structure. Trivial mitral valve regurgitation. Tricuspid Valve: The tricuspid valve is normal in structure. Tricuspid valve regurgitation is not demonstrated. Aortic Valve: The aortic valve is tricuspid. Aortic valve regurgitation is not visualized. Mild aortic valve sclerosis is present, with no evidence of aortic valve stenosis. Aortic valve mean gradient measures 1.5 mmHg. Aortic valve peak gradient measures 3.4 mmHg. Aortic valve area, by VTI measures 3.33 cm. Pulmonic Valve: The pulmonic valve was normal in structure. Pulmonic valve regurgitation is not visualized. Aorta: The aortic root is normal in size and structure. Venous: The inferior vena cava was not well visualized. IAS/Shunts: No atrial level shunt detected by color flow Doppler.  LEFT VENTRICLE PLAX 2D LVIDd:         4.68 cm  Diastology LVIDs:         2.65 cm  LV e' lateral:   8.27 cm/s LV PW:         1.35 cm  LV E/e' lateral: 7.9 LV IVS:        1.37 cm  LV e' medial:    6.42 cm/s LVOT diam:     2.00 cm  LV E/e' medial:  10.1 LV SV:         64 LV SV Index:   36 LVOT Area:     3.14 cm  RIGHT VENTRICLE RV Basal diam:  3.80 cm RV S prime:     10.20 cm/s TAPSE (M-mode): 3.2 cm LEFT ATRIUM             Index       RIGHT ATRIUM           Index LA diam:        4.40 cm 2.48 cm/m  RA Area:     13.40 cm LA Vol (A2C):   60.7 ml 34.28 ml/m RA Volume:   30.70 ml  17.34 ml/m LA Vol (A4C):   69.9 ml 39.48 ml/m LA Biplane Vol: 66.0 ml 37.27 ml/m  AORTIC VALVE  PULMONIC VALVE AV Area (Vmax):    2.98 cm    PV Vmax:        0.79 m/s AV Area (Vmean):   3.30 cm    PV Peak grad:   2.5 mmHg AV Area (VTI):     3.33 cm    RVOT Peak grad: 2 mmHg AV Vmax:           91.80 cm/s AV Vmean:          54.900 cm/s AV VTI:            0.192 m AV Peak Grad:      3.4 mmHg AV Mean Grad:      1.5 mmHg LVOT Vmax:         87.20 cm/s LVOT Vmean:        57.600 cm/s LVOT VTI:          0.203 m  LVOT/AV VTI ratio: 1.06  AORTA Ao Root diam: 2.90 cm MITRAL VALVE MV Area (PHT): 2.62 cm    SHUNTS MV Decel Time: 290 msec    Systemic VTI:  0.20 m MV E velocity: 65.00 cm/s  Systemic Diam: 2.00 cm MV A velocity: 91.70 cm/s MV E/A ratio:  0.71 Kate Sable MD Electronically signed by Kate Sable MD Signature Date/Time: 05/04/2019/6:34:27 PM    Final    CT Renal Stone Study  Result Date: 05/02/2019 CLINICAL DATA:  Left flank pain, lower back pain EXAM: CT ABDOMEN AND PELVIS WITHOUT CONTRAST TECHNIQUE: Multidetector CT imaging of the abdomen and pelvis was performed following the standard protocol without IV contrast. COMPARISON:  None. FINDINGS: Lower chest: Lung bases are clear. No effusions. Heart is normal size. Hepatobiliary: No focal hepatic abnormality. Gallbladder unremarkable. Pancreas: No focal abnormality or ductal dilatation. Spleen: No focal abnormality.  Normal size. Adrenals/Urinary Tract: Large solid mass is seen in the lower pole of the left kidney measuring 7.4 x 6.9 cm. Central calcifications noted. Findings concerning for renal cell carcinoma. There is mild left hydronephrosis which may be related to mass effect from the mass on the adjacent proximal left ureter. High-density material is seen within the left renal collecting system and ureter compatible with blood. No hydronephrosis on the right. Adrenal glands are unremarkable. Bladder wall appears diffusely thickened. Stomach/Bowel: Normal appendix. Stomach, large and small bowel grossly unremarkable. Vascular/Lymphatic: No evidence of aneurysm or adenopathy. Reproductive: Prostate enlargement. Other: 2 No free fluid or free air. Musculoskeletal: No acute bony abnormality. IMPRESSION: Large solid mass involving the lower pole of the left kidney concerning for renal cell carcinoma measuring up to 7.4 cm. Mild left hydronephrosis, possibly related to mass effect or blood clot within the left ureter. Diffusely thick walled bladder,  likely related to prostate enlargement. Electronically Signed   By: Rolm Baptise M.D.   On: 05/02/2019 01:24      ASSESSMENT/PLAN   Pulmonary nodules with hilar/mediastinal lymphadenopathy    -plan for biopsy via electromagnetic navigational bronchoscopy as well as EBUS assisted LN biopsy Complications include: bleeding, cough, infection, pneumothorax, pneumomediastinum, possible need for chest tube and intubation with mechanical ventilation and rarely death.  Patient relates understanding above and all additional questions have been answered.     -benefits include - diagnosis of metastatic cancer      Thank you for allowing me to participate in the care of this patient.   Patient/Family are satisfied with care plan and all questions have been answered.  This document was prepared using Systems analyst and  may include unintentional dictation errors.     Ottie Glazier, M.D.  Division of [place]

## 2019-05-05 NOTE — Interval H&P Note (Signed)
Ultimately the patient and his family have elected to undergo left radical nephrectomy with cytoreductive intent.  Risk and benefits including risk of bleeding, infection, damage surrounding structures amongst others were all reviewed yesterday in detail and again at bedside with translator.  All questions answered.  Site marked on the left.

## 2019-05-05 NOTE — Progress Notes (Signed)
Patient only in a gown at this point and has voided. Nursing assisted patient to take his handmade cloth bracelets off. Total of 3 placed in a plastic bag and kept in patient room. He had a silver pinky ring on he states he has had on for the last 40 years and could not take off. Patient Cell Phone was place in belongings bag per patient.

## 2019-05-05 NOTE — Anesthesia Procedure Notes (Signed)
Arterial Line Insertion Start/End4/22/2021 7:40 AM, 05/05/2019 7:43 AM Performed by: Arita Miss, MD, Nelda Marseille, CRNA, CRNA  Preanesthetic checklist: patient identified, IV checked, site marked, risks and benefits discussed, surgical consent, monitors and equipment checked, pre-op evaluation, timeout performed and anesthesia consent radial was placed Catheter size: 20 G Hand hygiene performed  and Seldinger technique used Allen's test indicative of satisfactory collateral circulation Attempts: 1 Procedure performed without using ultrasound guided technique. Ultrasound Notes:anatomy identified Following insertion, dressing applied. Patient tolerated the procedure well with no immediate complications. Additional procedure comments: Post induction a line, inserted by [employer] [facility] [employer] .

## 2019-05-05 NOTE — Transfer of Care (Signed)
Immediate Anesthesia Transfer of Care Note  Patient: Merdis Delay  Procedure(s) Performed: HAND ASSISTED LAPAROSCOPIC NEPHRECTOMY (Left )  Patient Location: PACU  Anesthesia Type:General  Level of Consciousness: drowsy  Airway & Oxygen Therapy: Patient Spontanous Breathing and Patient connected to face mask oxygen  Post-op Assessment: Report given to RN and Post -op Vital signs reviewed and stable  Post vital signs: Reviewed and stable  Last Vitals:  Vitals Value Taken Time  BP 98/70 05/05/19 1112  Temp 36.1 C 05/05/19 1105  Pulse 84 05/05/19 1113  Resp 12 05/05/19 1113  SpO2 100 % 05/05/19 1113  Vitals shown include unvalidated device data.  Last Pain:  Vitals:   05/05/19 0645  TempSrc: Temporal  PainSc: 5          Complications: No apparent anesthesia complications

## 2019-05-05 NOTE — Anesthesia Procedure Notes (Signed)
Procedure Name: Intubation Performed by: Cory Munch, RN Pre-anesthesia Checklist: Patient identified, Emergency Drugs available, Suction available and Patient being monitored Patient Re-evaluated:Patient Re-evaluated prior to induction Oxygen Delivery Method: Circle system utilized Preoxygenation: Pre-oxygenation with 100% oxygen Induction Type: IV induction Ventilation: Mask ventilation without difficulty and Oral airway inserted - appropriate to patient size Laryngoscope Size: Mac and 3 Grade View: Grade I Tube type: Oral Tube size: 7.5 mm Number of attempts: 1 Airway Equipment and Method: Stylet and Oral airway Placement Confirmation: ETT inserted through vocal cords under direct vision,  positive ETCO2 and breath sounds checked- equal and bilateral Secured at: 24 cm Tube secured with: Tape Dental Injury: Teeth and Oropharynx as per pre-operative assessment

## 2019-05-05 NOTE — Progress Notes (Signed)
PROGRESS NOTE    Mark Donaldson  AUQ:[id number] DOB: May 07, 1960 DOA: 05/02/2019 PCP: Frazier Richards, MD    Chief Complaint  Patient presents with  . Flank Pain  . Hematuria    Subjective: Postop day #0 Status post laparoscopic assisted left radical nephrectomy  The patient was seen and examined postop. 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  UA showed significant RBC, WBC and bacteria.  CT renal study showed 7.4 cm left lower pole renal mass concerning for renal cell carcinoma and mild left hydronephrosis secondary to mass-effect versus blood clot within the left ureter.  Also had thick-walled bladder possibly secondary to enlarged prostate. Patient septic with tachycardia, tachypnea, uncontrolled hypertension and lactic acidosis and ATN.  Placed on empiric IV Rocephin, IV fluid bolus.  Culture sent and admitted to hospital service.  Assessment & Plan:   Principal Problem: Severe sepsis with acute target organ damage. -Much improved, remained stable -Much improved, resolved, afebrile normotensive, improved leukocytosis, lactic acidosis Secondary to UTI.  -Left lower abdominal and flank pain resolved as well.  Continue empiric IV Rocephin.   -Urine culture with  mixed bacteria.   Left renal mass suspicious of RCC and hydronephrosis of left kidney. -Appreciate urology following very closely  -05/05/2019: Postop day #0 Status post laparoscopic assisted left radical nephrectomy  - (Plavix still on hold post 5 days).  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 Last echo from [facility] shows EF of >55% with grade 1 diastolic dysfunction and degenerative mitral valve disease.  Follows with [facility] cardiology. Currently euvolemic.  - Aspirin and Plavix on hold.   -Continue statin and beta-blocker.   Holding lisinopril and Aldactone due to AKI.  Patient seen by cardiology for preop clearance and recommends moderate risk for surgery.  Follow 2D echo.... Pending final result   No further preoperative cardiac testing needed.  Acute tubular necrosis Likely prerenal with sepsis + mild left-sided hydronephrosis.  Monitor with fluids.   Avoid ACE inhibitor/Aldactone and nephrotoxins.  Uncontrolled hypertension Likely associated with pain and sepsis.  Continue metoprolol and as needed hydralazine.  Gross hematuria Likely from [place].  Currently resolved.  H&H stable. Plan as above, pursuing  DVT prophylaxis: SCDs Code Status: Full code Family Communication: Discussed with son on 4/19. Disposition: Plan on surgery on 4/22  Status is: Inpatient    Dispo: The patient is from: Home              Anticipated d/c is to: Home with home health  Anticipated d/c date is: > 3 days               Patient currently is not medically stable to d/c.  Patient requires inpatient monitoring  radical nephrectomy  on 05/05/19.  Oncology follow-up  ? Pulmonology, bronchoscopy for biopsy of lymph node    Consultants:   Urology  Oncology  Cardiology     Procedures:   CT renal study, 2D echo  05/05/2019 - status post laparoscopic assisted left radical nephrectomy   Antimicrobials:  IV Rocephin    Objective: Vitals:   05/05/19 1230 05/05/19 1238 05/05/19 1253 05/05/19 1328  BP: (!) 86/63  93/68 103/78  Pulse: 84 74 81 88  Resp: 15 20 15    Temp:    98.2 F (36.8 C)  TempSrc:      SpO2: 98% 98% 98% 98%  Weight:      Height:        Intake/Output Summary (Last 24 hours) at 05/05/2019 1339 Last data filed at 05/05/2019 1324 Gross per 24 hour  Intake 7371.69 ml  Output 2275 ml  Net 5096.69 ml   Filed Weights   05/01/19 2336 05/02/19 0541 05/05/19 0645  Weight: 71.7 kg 69.9 kg 69.9 kg   BP 103/78   Pulse 88   Temp 98.2 F (36.8 C)   Resp 15   Ht 5\' 5"  (1.651 m)   Wt 69.9 kg   SpO2 98%   BMI 25.64 kg/m    Physical Exam  Limited exam postop patient is somnolent Constitution: In no distress,  HEENT: Normocephalic, PERRL, otherwise with in Normal limits  Chest:Chest symmetric Cardio vascular:  S1/S2, RRR, No murmure, No Rubs or Gallops  pulmonary: Clear to auscultation bilaterally, respirations unlabored, negative wheezes / crackles Abdomen: Soft, non-tender, non-distended, bowel sounds,no masses, no organomegaly Muscular skeletal/lower extremities: Limited exam - in bed, able to move all 4 extremities, Normal strength, +2 pulses Neuro: Limited exam patient is somnolent, roving all 4 extremities in bed Skin: Dry, warm to touch, negative for any Rashes, No open wounds Wounds: Surgical wound        Data Reviewed: I have personally reviewed following labs and imaging studies  CBC: Recent Labs  Lab 05/01/19 2344 05/02/19 0629 05/03/19 0531  WBC 13.7*  11.1* 10.4  HGB 14.6 14.8 14.5  HCT 43.2 41.9 43.1  MCV 89.4 86.2 90.2  PLT 185 173 697    Basic Metabolic Panel: Recent Labs  Lab 05/01/19 2344 05/03/19 0531 05/05/19 0600  NA 130* 139 142  K 3.7 4.3 3.8  CL 96* 107 111  CO2 22 27 23   GLUCOSE 106* 99 99  BUN 17 15 11   CREATININE 1.29* 1.08 0.95  CALCIUM 8.5* 8.3* 8.4*    GFR: Estimated Creatinine Clearance: 74.6 mL/min (by C-G formula based on SCr of 0.95 mg/dL).  Liver Function Tests: Recent Labs  Lab 05/03/19 0531  AST 28  ALT 20  ALKPHOS 72  BILITOT 0.9  PROT 6.2*  ALBUMIN 3.0*    CBG: No results for input(s): GLUCAP in the last 168 hours.   Recent Results (from the past 240 hour(s))  Urine culture     Status: Abnormal   Collection Time: 05/01/19 11:44 PM   Specimen: In/Out Cath Urine  Result Value Ref Range Status   Specimen Description   Final    IN/OUT CATH URINE Performed at [facility], [address] [place], [place] [address]    Special Requests   Final  NONE Performed at [facility], [address] [place], [place] [address]    Culture MULTIPLE SPECIES PRESENT, SUGGEST RECOLLECTION (A)  Final   Report Status 05/03/2019 FINAL  Final  Blood culture (routine x 2)     Status: None (Preliminary result)   Collection Time: 05/02/19  2:33 AM   Specimen: BLOOD  Result Value Ref Range Status   Specimen Description BLOOD LEFT WRIST  Final   Special Requests   Final    BOTTLES DRAWN AEROBIC AND ANAEROBIC Blood Culture adequate volume   Culture   Final    NO GROWTH 2 DAYS Performed at [facility], [address] [place], [place] [address]    Report Status PENDING  Incomplete  Blood culture (routine x 2)     Status: None (Preliminary result)   Collection Time: 05/02/19  2:34 AM   Specimen: BLOOD  Result Value Ref Range Status   Specimen Description BLOOD LEFT [facility]  Final   Special Requests   Final    BOTTLES DRAWN AEROBIC AND ANAEROBIC Blood Culture adequate  volume   Culture   Final    NO GROWTH 2 DAYS Performed at [facility], [address], [place], [place] [address]    Report Status PENDING  Incomplete  Respiratory Panel by RT PCR (Flu A&B, Covid) - Nasopharyngeal Swab     Status: None   Collection Time: 05/02/19  2:34 AM   Specimen: Nasopharyngeal Swab  Result Value Ref Range Status   SARS Coronavirus 2 by RT PCR NEGATIVE NEGATIVE Final    Comment: (NOTE) S    Influenza A by PCR NEGATIVE NEGATIVE Final   Influenza B by PCR NEGATIVE NEGATIVE Final      Radiology Studies: ECHOCARDIOGRAM COMPLETE  Result Date: 05/04/2019    ECHOCARDIOGRAM REPORT   Patient Name:   Mark Donaldson Date of Exam: 05/04/2019 Medical Rec #:  [id number]                Height:       65.0 in Accession #:    [id number]               Weight:       154.1 lb Date of Birth:  04/13/1960               BSA:          1.771 m Patient Age:    12 years                 BP:           153/88 mmHg Patient Gender: M                        HR:           75 bpm. Exam Location:  [facility] Procedure: 2D Echo, Cardiac Doppler and Color Doppler Indications:     CHF- 428.0  History:         Patient has no prior history of Echocardiogram examinations.                  Risk Factors:Hypertension. Ischemic cardiomyopathy.  Sonographer:     Sherrie Sport RDCS (AE) Referring Phys:  Lerry Liner [facility] Diagnosing Phys: Kate Sable MD IMPRESSIONS  1. Left ventricular ejection fraction, by estimation, is 60 to 65%. The left ventricle has normal function. The left ventricle has no regional wall motion abnormalities. There is mild left ventricular  hypertrophy. Left ventricular diastolic parameters are consistent with Grade I diastolic dysfunction (impaired relaxation).  2. Right ventricular systolic function is normal. The right ventricular size is normal.  3. Left atrial size was mildly dilated.  4. The mitral valve is normal in structure. Trivial mitral valve regurgitation.  5. The  aortic valve is tricuspid. Aortic valve regurgitation is not visualized. Mild aortic valve sclerosis is present, with no evidence of aortic valve stenosis. FINDINGS  Left Ventricle: Left ventricular ejection fraction, by estimation, is 60 to 65%. The left ventricle has normal function. The left ventricle has no regional wall motion abnormalities. The left ventricular internal cavity size was normal in size. There is  mild left ventricular hypertrophy. Left ventricular diastolic parameters are consistent with Grade I diastolic dysfunction (impaired relaxation). Right Ventricle: The right ventricular size is normal. Right vetricular wall thickness was not assessed. Right ventricular systolic function is normal. Left Atrium: Left atrial size was mildly dilated. Right Atrium: Right atrial size was normal in size. Pericardium: There is no evidence of pericardial effusion. Mitral Valve: The mitral valve is normal in structure. Trivial mitral valve regurgitation. Tricuspid Valve: The tricuspid valve is normal in structure. Tricuspid valve regurgitation is not demonstrated. Aortic Valve: The aortic valve is tricuspid. Aortic valve regurgitation is not visualized. Mild aortic valve sclerosis is present, with no evidence of aortic valve stenosis. Aortic valve mean gradient measures 1.5 mmHg. Aortic valve peak gradient measures 3.4 mmHg. Aortic valve area, by VTI measures 3.33 cm. Pulmonic Valve: The pulmonic valve was normal in structure. Pulmonic valve regurgitation is not visualized. Aorta: The aortic root is normal in size and structure. Venous: The inferior vena cava was not well visualized. IAS/Shunts: No atrial level shunt detected by color flow Doppler.  LEFT VENTRICLE PLAX 2D LVIDd:         4.68 cm  Diastology LVIDs:         2.65 cm  LV e' lateral:   8.27 cm/s LV PW:         1.35 cm  LV E/e' lateral: 7.9 LV IVS:        1.37 cm  LV e' medial:    6.42 cm/s LVOT diam:     2.00 cm  LV E/e' medial:  10.1 LV SV:         64  LV SV Index:   36 LVOT Area:     3.14 cm  RIGHT VENTRICLE RV Basal diam:  3.80 cm RV S prime:     10.20 cm/s TAPSE (M-mode): 3.2 cm LEFT ATRIUM             Index       RIGHT ATRIUM           Index LA diam:        4.40 cm 2.48 cm/m  RA Area:     13.40 cm LA Vol (A2C):   60.7 ml 34.28 ml/m RA Volume:   30.70 ml  17.34 ml/m LA Vol (A4C):   69.9 ml 39.48 ml/m LA Biplane Vol: 66.0 ml 37.27 ml/m  AORTIC VALVE                   PULMONIC VALVE AV Area (Vmax):    2.98 cm    PV Vmax:        0.79 m/s AV Area (Vmean):   3.30 cm    PV Peak grad:   2.5 mmHg AV Area (VTI):     3.33 cm  RVOT Peak grad: 2 mmHg AV Vmax:           91.80 cm/s AV Vmean:          54.900 cm/s AV VTI:            0.192 m AV Peak Grad:      3.4 mmHg AV Mean Grad:      1.5 mmHg LVOT Vmax:         87.20 cm/s LVOT Vmean:        57.600 cm/s LVOT VTI:          0.203 m LVOT/AV VTI ratio: 1.06  AORTA Ao Root diam: 2.90 cm MITRAL VALVE MV Area (PHT): 2.62 cm    SHUNTS MV Decel Time: 290 msec    Systemic VTI:  0.20 m MV E velocity: 65.00 cm/s  Systemic Diam: 2.00 cm MV A velocity: 91.70 cm/s MV E/A ratio:  0.71 Kate Sable MD Electronically signed by Kate Sable MD Signature Date/Time: 05/04/2019/6:34:27 PM    Final         Scheduled Meds: . [MAR Hold] atorvastatin  80 mg Oral QHS  . [MAR Hold] Chlorhexidine Gluconate Cloth  6 each Topical Q0600  . [MAR Hold] metoprolol succinate  150 mg Oral Daily  . sodium chloride flush       Continuous Infusions: . sodium chloride 125 mL/hr at 05/05/19 1237  . [MAR Hold] sodium chloride Stopped (05/05/19 0615)  . acetaminophen    . [MAR Hold] cefTRIAXone (ROCEPHIN)  IV 1 g (05/05/19 0318)     LOS: 3 days    Time spent: 35 minutes    Deatra James, MD [facility]   To contact the attending provider between 7A-7P or the covering provider during after hours 7P-7A, please log into the web site [email or web address] and access using universal  password for that web  site. If you do not have the password, please call the hospital operator.  05/05/2019, 1:39 PM

## 2019-05-05 NOTE — Op Note (Signed)
PREOP DIAGNOSIS: Left renal mass  POSTOPERATIVE DIAGNOSIS: Left renal mass  OPERATION PERFORMED: Hand-assisted laparoscopic left radical nephrectomy.   SURGEON: Hollice Espy, MD   Assistant: Nickolas Madrid, MD   ANESTHESIA: General.   ESTIMATED BLOOD LOSS: 400  cc   DRAINS: 16-French Foley catheter.   COMPLICATIONS: None.  Indications: Invasive appearing left lower pole renal mass, bleeding   DESCRIPTION OF OPERATION: Informed consent was obtained. The patient was marked on the left side. IV antibiotics were given for bacterial prophylaxis on call to the operating room. SCDs were provided for DVT prophylaxis. The patient was taken to the operating room and placed supine on the operating table. General anesthesia was provided. A Foley catheter was placed to drain the bladder.  The patient was positioned in right lateral decubitus with the left flank elevated about 70 degrees and the table flexed slightly. The left arm was placed in a padded airplane for support. Axillary roll was positioned. The patient was secured to the table with soft straps and then prepped and draped sterilely.   We had a time-out confirming the patient identification, planned procedure, surgical site, and all present were in agreement. All present were in agreement.   A 8 cm incision was made for the hand port in the left lower quadrant. The anterior fascia was incised and elevated. The rectus belly was retracted medially and the peritoneum was incised. An incisional block was provided with liposomal Marcaine. The GelPort was assembled. A 12 mm trocar was placed above the umbilicus, and then the abdomen was insufflated. Laparoscopic survey revealed no abnormalities or injuries. An additional 12 mm trocar was just below  the subxiphoid. All port sites were then infiltrated with liposomal Marcaine. Zero Vicryls were placed at the 12 mm trocar sites with the Carter-Thomason device  for closure at the end of the case.   The white line of Toldt was incised. The colon was reflected from the spleen to the pelvis. Gerota's fascia was lifted up off the lower pole of the kidney and the ureter and gonadal vessels were identified. The gonadal vein was exposed and followed up to the main renal vein. The branch point of the gonadal vein and adrenal vein were exposed at the renal vein. The upper pole of the kidney was mobilized off of the quadratus lumborum and further mobilized away from the spleen. The adrenal gland was identified and spared during the upper pole dissection. The lower pole and lateral attachments were freed. The kidney was held laterally and the hilar dissection was completed.     The ureter was exposed, clipped distally using 12 mm clips, and divided sharply. The ureter stump was confirmed hemostatic.  At this point, the hilar vessels were divided en bloc with a 60 mm vascular load staple using the battery operated endovascular stapler. Additional staple fire loads were used inferiorly and superior medially to help free the kidney.  An endocatch bag was then used to bag the specimen.  The kidney was extracted through the gel port and passed off for pathological analysis.   Surgicel and surgiflow were used as additional hemostatic products.   Pneumoperitoneal pressure was reduced to 7 mmHg and the abdomen was inspected; hemostasis was confirmed. The 12 mm trocars were removed and the port sites closed with previously placed 0 Vicryl. The Gelport was removed. The anterior fascia was closed with a running 1 PDS looped.  The subcutaneous tissue was closed using 2-0 vicryl. All the incisions were irrigated, patted dry, and  then the skin was reapproximated with 4-0 Monocryl in a subcuticular fashion. The wounds were cleaned and dried and covered with Dermabond. All sponge, needle, and instrument counts were reported correct x2.   The patient was awakened from  anesthesia and transferred to recovery in stable condition. There were no complications. The patient tolerated the procedure well. ______________________________    Hollice Espy, MD

## 2019-05-06 DIAGNOSIS — Z515 Encounter for palliative care: Secondary | ICD-10-CM

## 2019-05-06 LAB — TYPE AND SCREEN
ABO/RH(D): O POS
Antibody Screen: NEGATIVE
Unit division: 0
Unit division: 0

## 2019-05-06 LAB — CBC
HCT: 37.2 % — ABNORMAL LOW (ref 39.0–52.0)
Hemoglobin: 12.1 g/dL — ABNORMAL LOW (ref 13.0–17.0)
MCH: 30.6 pg (ref 26.0–34.0)
MCHC: 32.5 g/dL (ref 30.0–36.0)
MCV: 94.2 fL (ref 80.0–100.0)
Platelets: 173 10*3/uL (ref 150–400)
RBC: 3.95 MIL/uL — ABNORMAL LOW (ref 4.22–5.81)
RDW: 13.2 % (ref 11.5–15.5)
WBC: 11.6 10*3/uL — ABNORMAL HIGH (ref 4.0–10.5)
nRBC: 0 % (ref 0.0–0.2)

## 2019-05-06 LAB — BPAM RBC
Blood Product Expiration Date: 202105042359
Blood Product Expiration Date: 202105042359
Unit Type and Rh: 9500
Unit Type and Rh: 9500

## 2019-05-06 LAB — BASIC METABOLIC PANEL
Anion gap: 6 (ref 5–15)
BUN: 16 mg/dL (ref 6–20)
CO2: 25 mmol/L (ref 22–32)
Calcium: 7.8 mg/dL — ABNORMAL LOW (ref 8.9–10.3)
Chloride: 109 mmol/L (ref 98–111)
Creatinine, Ser: 1.67 mg/dL — ABNORMAL HIGH (ref 0.61–1.24)
GFR calc Af Amer: 52 mL/min — ABNORMAL LOW (ref >60)
GFR calc non Af Amer: 45 mL/min — ABNORMAL LOW (ref >60)
Glucose, Bld: 100 mg/dL — ABNORMAL HIGH (ref 70–99)
Potassium: 4.5 mmol/L (ref 3.5–5.1)
Sodium: 140 mmol/L (ref 135–145)

## 2019-05-06 LAB — PREPARE RBC (CROSSMATCH)

## 2019-05-06 MED ORDER — AMLODIPINE BESYLATE 5 MG PO TABS
5.0000 mg | ORAL_TABLET | Freq: Every day | ORAL | Status: DC
  Administered 2019-05-06 – 2019-05-07 (×2): 5 mg via ORAL
  Filled 2019-05-06 (×2): qty 1

## 2019-05-06 NOTE — Progress Notes (Signed)
Hematology/Oncology Progress Note [facility] [phone] [phone]  Patient Care Team: Frazier Richards, MD as PCP - General (Family Medicine)   Name of the patient: Mark Donaldson  [id number]  1960-04-26  Date of visit: 05/06/19   INTERVAL HISTORY-  Patient was seen at bedside.  Online interpretor was used for the encounter.   No Known Allergies  Patient Active Problem List   Diagnosis Date Noted  . Palliative care encounter   . History of vasculitis   . Thoracic lymphadenopathy   . Lung nodule   . CAD S/P percutaneous coronary angioplasty 05/02/2019  . Kidney mass 05/02/2019  . Hydronephrosis of left kidney 05/02/2019  . UTI (urinary tract infection) 05/02/2019  . AKI (acute kidney injury) ([place]) 05/02/2019  . Gross hematuria 05/02/2019  . Chronic diastolic CHF (congestive heart failure) ([place]) 05/02/2019  . Sepsis ([place]) 05/02/2019  . Severe sepsis ([place]) 05/02/2019  . Preop cardiovascular exam      Past Medical History:  Diagnosis Date  . 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 CAD (coronary artery disease)   . Essential hypertension   . HFrEF (heart failure with reduced ejection fraction) ([place])   . Hyperlipidemia LDL goal <70   . Ischemic cardiomyopathy   . Renal disorder      Past Surgical History:  Procedure Laterality Date  . CARDIAC SURGERY     cardiac cath with 2 stent placement  . LAPAROSCOPIC NEPHRECTOMY, HAND ASSISTED Left 05/05/2019   Procedure: HAND ASSISTED LAPAROSCOPIC NEPHRECTOMY;  Surgeon: Hollice Espy, MD;  Location: [facility] ORS;  Service: Urology;  Laterality: Left;    Social History   Socioeconomic History  . Marital status: Married    Spouse name: Not on file  . Number of children: Not on file  . Years of education: Not on file  . Highest education level: Not on file  Occupational History  . Not on file  Tobacco Use  . Smoking status: Never Smoker  . Smokeless tobacco: Never Used    Substance and Sexual Activity  . Alcohol use: Yes  . Drug use: Not on file  . Sexual activity: Not on file  Other Topics Concern  . Not on file  Social History Narrative  . Not on file   Social Determinants of Health   Financial Resource Strain:   . Difficulty of Paying Living Expenses:   Food Insecurity:   . Worried About Charity fundraiser in the Last Year:   . Arboriculturist in the Last Year:   Transportation Needs:   . Film/video editor (Medical):   Marland Kitchen Lack of Transportation (Non-Medical):   Physical Activity:   . Days of Exercise per Week:   . Minutes of Exercise per Session:   Stress:   . Feeling of Stress :   Social Connections:   . Frequency of Communication with Friends and Family:   . Frequency of Social Gatherings with Friends and Family:   . Attends Religious Services:   . Active Member of Clubs or Organizations:   . Attends Archivist Meetings:   Marland Kitchen Marital Status:   Intimate Partner Violence:   . Fear of Current or Ex-Partner:   . Emotionally Abused:   Marland Kitchen Physically Abused:   . Sexually Abused:      Family History  Problem Relation Age of Onset  . Cancer Mother   . Blindness Brother      Current Facility-Administered Medications:  .  0.9 %  sodium chloride infusion, , Intravenous, Continuous, Athena Masse, MD, Last Rate: 125 mL/hr at 05/06/19 1300, New Bag at 05/06/19 1300 .  0.9 %  sodium chloride infusion, , Intravenous, PRN, Dhungel, Nishant, MD, Stopped at 05/05/19 0615 .  acetaminophen (TYLENOL) tablet 650 mg, 650 mg, Oral, Q6H PRN, 650 mg at 05/04/19 2215 **OR** acetaminophen (TYLENOL) suppository 650 mg, 650 mg, Rectal, Q6H PRN, Judd Gaudier V, MD .  amLODipine (NORVASC) tablet 5 mg, 5 mg, Oral, Daily, Shahmehdi, Seyed A, MD, 5 mg at 05/06/19 1259 .  atorvastatin (LIPITOR) tablet 80 mg, 80 mg, Oral, QHS, Dhungel, Nishant, MD, 80 mg at 05/05/19 2140 .  Chlorhexidine Gluconate Cloth 2 % PADS 6 each, 6 each, Topical, Q0600,  Shahmehdi, Seyed A, MD .  hydrALAZINE (APRESOLINE) injection 10 mg, 10 mg, Intravenous, Q6H PRN, Dhungel, Nishant, MD, 10 mg at 05/05/19 0611 .  metoprolol succinate (TOPROL-XL) 24 hr tablet 150 mg, 150 mg, Oral, Daily, Dhungel, Nishant, MD, 150 mg at 05/06/19 0826 .  morphine 4 MG/ML injection 4 mg, 4 mg, Intravenous, Q2H PRN, Athena Masse, MD, 4 mg at 05/06/19 1251 .  ondansetron (ZOFRAN) tablet 4 mg, 4 mg, Oral, Q6H PRN **OR** ondansetron (ZOFRAN) injection 4 mg, 4 mg, Intravenous, Q6H PRN, Athena Masse, MD   Physical exam:  Vitals:   05/05/19 2045 05/06/19 0459 05/06/19 0825 05/06/19 1205  BP: (!) 147/92 (!) 146/93 (!) 174/92 (!) 156/91  Pulse: 94 64 80 70  Resp: 20  17 16   Temp: 99.2 F (37.3 C) 98.4 F (36.9 C) 98.6 F (37 C) 98.4 F (36.9 C)  TempSrc: Oral Oral Oral   SpO2: 98% 100% 100% 97%  Weight:      Height:       Physical Exam  Constitutional: No distress.  HENT:  Mouth/Throat: No oropharyngeal exudate.  Eyes: Pupils are equal, round, and reactive to light.  Pulmonary/Chest: Effort normal.  Abdominal: Soft.  Musculoskeletal:     Cervical back: Neck supple.  Neurological: He is alert.  Skin: Skin is warm.  Psychiatric: Affect normal.       CMP Latest Ref Rng & Units 05/06/2019  Glucose 70 - 99 mg/dL 100(H)  BUN 6 - 20 mg/dL 16  Creatinine 0.61 - 1.24 mg/dL 1.67(H)  Sodium 135 - 145 mmol/L 140  Potassium 3.5 - 5.1 mmol/L 4.5  Chloride 98 - 111 mmol/L 109  CO2 22 - 32 mmol/L 25  Calcium 8.9 - 10.3 mg/dL 7.8(L)  Total Protein 6.5 - 8.1 g/dL -  Total Bilirubin 0.3 - 1.2 mg/dL -  Alkaline Phos 38 - 126 U/L -  AST 15 - 41 U/L -  ALT 0 - 44 U/L -   CBC Latest Ref Rng & Units 05/06/2019  WBC 4.0 - 10.5 K/uL 11.6(H)  Hemoglobin 13.0 - 17.0 g/dL 12.1(L)  Hematocrit 39.0 - 52.0 % 37.2(L)  Platelets 150 - 400 K/uL 173    RADIOGRAPHIC STUDIES: I have personally reviewed the radiological images as listed and agreed with the findings in the report. CT  ABDOMEN PELVIS W WO CONTRAST  Result Date: 05/03/2019 CLINICAL DATA:  Renal mass. EXAM: CT CHEST WITH CONTRAST CT ABDOMEN AND PELVIS WITH AND WITHOUT CONTRAST TECHNIQUE: Multidetector CT imaging of the chest was performed during intravenous contrast administration. Multidetector CT imaging of the abdomen and pelvis was performed following the standard protocol before and during bolus administration of intravenous contrast. CONTRAST:  145mL OMNIPAQUE IOHEXOL 300 MG/ML  SOLN COMPARISON:  05/02/2019 CT  stone study. FINDINGS: CT CHEST FINDINGS Cardiovascular: The heart size is normal. No substantial pericardial effusion. Coronary artery calcification is evident. No thoracic aortic aneurysm. Mediastinum/Nodes: Necrotic lymph nodes are identified in the right mediastinum along the pleural surface and right paratracheal space. Nodal conglomeration to the right of the trachea measures 3.5 x 1.9 cm on 22/3. No left hilar lymphadenopathy. The esophagus has normal imaging features. There is no axillary lymphadenopathy. 15 mm short axis right retrocrural node is visible on 34/18. Lungs/Pleura: 1.7 cm perifissural nodule noted on the minor fissure (82/5). 8 mm posterior left lower lobe nodule seen on 78/5. 5 mm medial right upper lobe nodule visible on 57/5. Dependent atelectasis noted in both lower lobes. Tiny bilateral pleural effusions evident. Musculoskeletal: No worrisome lytic or sclerotic osseous abnormality. CT ABDOMEN AND PELVIS FINDINGS Hepatobiliary: No suspicious focal abnormality within the liver parenchyma. There is no evidence for gallstones, gallbladder wall thickening, or pericholecystic fluid. No intrahepatic or extrahepatic biliary dilation. Pancreas: No focal mass lesion. No dilatation of the main duct. No intraparenchymal cyst. No peripancreatic edema. Spleen: No splenomegaly. No focal mass lesion. Adrenals/Urinary Tract: No adrenal nodule or mass. Right kidney unremarkable. 6.4 x 6.7 x 7.0 cm  heterogeneously enhancing mass is identified in the lower pole left kidney. Lesion extends into the central sinus fat and displaces the renal pelvis and proximal ureter posteriorly. Left renal vein is patent. No evidence for hydroureter. The urinary bladder appears normal for the degree of distention. Stomach/Bowel: Stomach is unremarkable. No gastric wall thickening. No evidence of outlet obstruction. Duodenum is normally positioned as is the ligament of Treitz. No small bowel wall thickening. No small bowel dilatation. The terminal ileum is normal. The appendix is normal. No gross colonic mass. No colonic wall thickening. Vascular/Lymphatic: No abdominal aortic aneurysm. 9 mm left para-aortic node on 66/18 is not enlarged by CT criteria but is suspicious for metastatic disease. No pelvic sidewall lymphadenopathy. Reproductive: The prostate gland and seminal vesicles are unremarkable. Other: No intraperitoneal free fluid. Musculoskeletal: No worrisome lytic or sclerotic osseous abnormality. 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 IMPRESSION: 1. 7 cm heterogeneous mass lesion in the lower pole left kidney is compatible with renal cell carcinoma. Mass generates mass-effect on the left renal pelvis and proximal left ureter. Left renal vein patent. 2. Necrotic lymphadenopathy in the mediastinum and right suprahilar region is highly suspicious for metastatic disease. Bilateral pulmonary nodules also raise concern for metastatic involvement. 3. Right retrocrural lymphadenopathy with upper normal left para-aortic lymph node also suggest metastatic involvement. 4. 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 Electronically Signed   By: Misty Stanley M.D.   On: 05/03/2019 13:35   CT CHEST W CONTRAST  Result Date: 05/03/2019 CLINICAL DATA:  Renal mass. EXAM: CT CHEST WITH CONTRAST CT ABDOMEN AND PELVIS WITH AND WITHOUT CONTRAST TECHNIQUE: Multidetector CT  imaging of the chest was performed during intravenous contrast administration. Multidetector CT imaging of the abdomen and pelvis was performed following the standard protocol before and during bolus administration of intravenous contrast. CONTRAST:  154mL OMNIPAQUE IOHEXOL 300 MG/ML  SOLN COMPARISON:  05/02/2019 CT stone study. FINDINGS: CT CHEST FINDINGS Cardiovascular: The heart size is normal. No substantial pericardial effusion. Coronary artery calcification is evident. No thoracic aortic aneurysm. Mediastinum/Nodes: Necrotic lymph nodes are identified in the right mediastinum along the pleural surface and right paratracheal space. Nodal conglomeration to the right of the trachea measures 3.5 x 1.9 cm on 22/3. No  left hilar lymphadenopathy. The esophagus has normal imaging features. There is no axillary lymphadenopathy. 15 mm short axis right retrocrural node is visible on 34/18. Lungs/Pleura: 1.7 cm perifissural nodule noted on the minor fissure (82/5). 8 mm posterior left lower lobe nodule seen on 78/5. 5 mm medial right upper lobe nodule visible on 57/5. Dependent atelectasis noted in both lower lobes. Tiny bilateral pleural effusions evident. Musculoskeletal: No worrisome lytic or sclerotic osseous abnormality. CT ABDOMEN AND PELVIS FINDINGS Hepatobiliary: No suspicious focal abnormality within the liver parenchyma. There is no evidence for gallstones, gallbladder wall thickening, or pericholecystic fluid. No intrahepatic or extrahepatic biliary dilation. Pancreas: No focal mass lesion. No dilatation of the main duct. No intraparenchymal cyst. No peripancreatic edema. Spleen: No splenomegaly. No focal mass lesion. Adrenals/Urinary Tract: No adrenal nodule or mass. Right kidney unremarkable. 6.4 x 6.7 x 7.0 cm heterogeneously enhancing mass is identified in the lower pole left kidney. Lesion extends into the central sinus fat and displaces the renal pelvis and proximal ureter posteriorly. Left renal vein is  patent. No evidence for hydroureter. The urinary bladder appears normal for the degree of distention. Stomach/Bowel: Stomach is unremarkable. No gastric wall thickening. No evidence of outlet obstruction. Duodenum is normally positioned as is the ligament of Treitz. No small bowel wall thickening. No small bowel dilatation. The terminal ileum is normal. The appendix is normal. No gross colonic mass. No colonic wall thickening. Vascular/Lymphatic: No abdominal aortic aneurysm. 9 mm left para-aortic node on 66/18 is not enlarged by CT criteria but is suspicious for metastatic disease. No pelvic sidewall lymphadenopathy. Reproductive: The prostate gland and seminal vesicles are unremarkable. Other: No intraperitoneal free fluid. Musculoskeletal: No worrisome lytic or sclerotic osseous abnormality. 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 IMPRESSION: 1. 7 cm heterogeneous mass lesion in the lower pole left kidney is compatible with renal cell carcinoma. Mass generates mass-effect on the left renal pelvis and proximal left ureter. Left renal vein patent. 2. Necrotic lymphadenopathy in the mediastinum and right suprahilar region is highly suspicious for metastatic disease. Bilateral pulmonary nodules also raise concern for metastatic involvement. 3. Right retrocrural lymphadenopathy with upper normal left para-aortic lymph node also suggest metastatic involvement. 4. 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 Electronically Signed   By: Misty Stanley M.D.   On: 05/03/2019 13:35   ECHOCARDIOGRAM COMPLETE  Result Date: 05/04/2019    ECHOCARDIOGRAM REPORT   Patient Name:   ORREN PIETSCH Date of Exam: 05/04/2019 Medical Rec #:  [id number]                Height:       65.0 in Accession #:    [id number]               Weight:       154.1 lb Date of Birth:  May 02, 1960               BSA:          1.771 m Patient Age:    18 years                 BP:            153/88 mmHg Patient Gender: M                        HR:           75 bpm. Exam Location:  [place]  Procedure: 2D Echo, Cardiac Doppler and Color Doppler Indications:     CHF- 428.0  History:         Patient has no prior history of Echocardiogram examinations.                  Risk Factors:Hypertension. Ischemic cardiomyopathy.  Sonographer:     Sherrie Sport RDCS (AE) Referring Phys:  Lerry Liner [facility] Diagnosing Phys: Kate Sable MD IMPRESSIONS  1. Left ventricular ejection fraction, by estimation, is 60 to 65%. The left ventricle has normal function. The left ventricle has no regional wall motion abnormalities. There is mild left ventricular hypertrophy. Left ventricular diastolic parameters are consistent with Grade I diastolic dysfunction (impaired relaxation).  2. Right ventricular systolic function is normal. The right ventricular size is normal.  3. Left atrial size was mildly dilated.  4. The mitral valve is normal in structure. Trivial mitral valve regurgitation.  5. The aortic valve is tricuspid. Aortic valve regurgitation is not visualized. Mild aortic valve sclerosis is present, with no evidence of aortic valve stenosis. FINDINGS  Left Ventricle: Left ventricular ejection fraction, by estimation, is 60 to 65%. The left ventricle has normal function. The left ventricle has no regional wall motion abnormalities. The left ventricular internal cavity size was normal in size. There is  mild left ventricular hypertrophy. Left ventricular diastolic parameters are consistent with Grade I diastolic dysfunction (impaired relaxation). Right Ventricle: The right ventricular size is normal. Right vetricular wall thickness was not assessed. Right ventricular systolic function is normal. Left Atrium: Left atrial size was mildly dilated. Right Atrium: Right atrial size was normal in size. Pericardium: There is no evidence of pericardial effusion. Mitral Valve: The mitral valve is normal in structure. Trivial mitral  valve regurgitation. Tricuspid Valve: The tricuspid valve is normal in structure. Tricuspid valve regurgitation is not demonstrated. Aortic Valve: The aortic valve is tricuspid. Aortic valve regurgitation is not visualized. Mild aortic valve sclerosis is present, with no evidence of aortic valve stenosis. Aortic valve mean gradient measures 1.5 mmHg. Aortic valve peak gradient measures 3.4 mmHg. Aortic valve area, by VTI measures 3.33 cm. Pulmonic Valve: The pulmonic valve was normal in structure. Pulmonic valve regurgitation is not visualized. Aorta: The aortic root is normal in size and structure. Venous: The inferior vena cava was not well visualized. IAS/Shunts: No atrial level shunt detected by color flow Doppler.  LEFT VENTRICLE PLAX 2D LVIDd:         4.68 cm  Diastology LVIDs:         2.65 cm  LV e' lateral:   8.27 cm/s LV PW:         1.35 cm  LV E/e' lateral: 7.9 LV IVS:        1.37 cm  LV e' medial:    6.42 cm/s LVOT diam:     2.00 cm  LV E/e' medial:  10.1 LV SV:         64 LV SV Index:   36 LVOT Area:     3.14 cm  RIGHT VENTRICLE RV Basal diam:  3.80 cm RV S prime:     10.20 cm/s TAPSE (M-mode): 3.2 cm LEFT ATRIUM             Index       RIGHT ATRIUM           Index LA diam:        4.40 cm 2.48 cm/m  RA Area:     13.40 cm  LA Vol (A2C):   60.7 ml 34.28 ml/m RA Volume:   30.70 ml  17.34 ml/m LA Vol (A4C):   69.9 ml 39.48 ml/m LA Biplane Vol: 66.0 ml 37.27 ml/m  AORTIC VALVE                   PULMONIC VALVE AV Area (Vmax):    2.98 cm    PV Vmax:        0.79 m/s AV Area (Vmean):   3.30 cm    PV Peak grad:   2.5 mmHg AV Area (VTI):     3.33 cm    RVOT Peak grad: 2 mmHg AV Vmax:           91.80 cm/s AV Vmean:          54.900 cm/s AV VTI:            0.192 m AV Peak Grad:      3.4 mmHg AV Mean Grad:      1.5 mmHg LVOT Vmax:         87.20 cm/s LVOT Vmean:        57.600 cm/s LVOT VTI:          0.203 m LVOT/AV VTI ratio: 1.06  AORTA Ao Root diam: 2.90 cm MITRAL VALVE MV Area (PHT): 2.62 cm    SHUNTS MV  Decel Time: 290 msec    Systemic VTI:  0.20 m MV E velocity: 65.00 cm/s  Systemic Diam: 2.00 cm MV A velocity: 91.70 cm/s MV E/A ratio:  0.71 Kate Sable MD Electronically signed by Kate Sable MD Signature Date/Time: 05/04/2019/6:34:27 PM    Final    CT Renal Stone Study  Result Date: 05/02/2019 CLINICAL DATA:  Left flank pain, lower back pain EXAM: CT ABDOMEN AND PELVIS WITHOUT CONTRAST TECHNIQUE: Multidetector CT imaging of the abdomen and pelvis was performed following the standard protocol without IV contrast. COMPARISON:  None. FINDINGS: Lower chest: Lung bases are clear. No effusions. Heart is normal size. Hepatobiliary: No focal hepatic abnormality. Gallbladder unremarkable. Pancreas: No focal abnormality or ductal dilatation. Spleen: No focal abnormality.  Normal size. Adrenals/Urinary Tract: Large solid mass is seen in the lower pole of the left kidney measuring 7.4 x 6.9 cm. Central calcifications noted. Findings concerning for renal cell carcinoma. There is mild left hydronephrosis which may be related to mass effect from the mass on the adjacent proximal left ureter. High-density material is seen within the left renal collecting system and ureter compatible with blood. No hydronephrosis on the right. Adrenal glands are unremarkable. Bladder wall appears diffusely thickened. Stomach/Bowel: Normal appendix. Stomach, large and small bowel grossly unremarkable. Vascular/Lymphatic: No evidence of aneurysm or adenopathy. Reproductive: Prostate enlargement. Other: 2 No free fluid or free air. Musculoskeletal: No acute bony abnormality. IMPRESSION: Large solid mass involving the lower pole of the left kidney concerning for renal cell carcinoma measuring up to 7.4 cm. Mild left hydronephrosis, possibly related to mass effect or blood clot within the left ureter. Diffusely thick walled bladder, likely related to prostate enlargement. Electronically Signed   By: Rolm Baptise M.D.   On: 05/02/2019  01:24    Assessment and plan-  Patient is a 59 y.o. 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 CT showed 6.4 x 6.7 x 7 cm left kidney mass, retroperitoneal lymphadenopathy as well as necrotic lymphadenopathy in the mediastinum  and the right suprahilar region, bilateral lung nodules  #Hematuria with findings of left kidney mass.  Suspect RCC Status post cytoreductive nephrectomy Patient was also evaluated by Dr. Lanney Gins for biopsy via bronchoscopy. Patient has decided not to proceed further biopsy at this point. Pathology is pending. Patient was offered appointment to follow-up at [facility] outpatient to go over pathological report and management plan.  Thank you for allowing me to participate in the care of this patient.   Earlie Server, MD, PhD Hematology Oncology [facility] at [facility] Pager- [phone] 05/06/2019

## 2019-05-06 NOTE — Consult Note (Signed)
[place]  [phone] [phone]   Name: [name] Date: 05/06/2019 MRN: [id number]  DOB: December 26, 1960  Patient Care Team: Frazier Richards, MD as PCP - General (Family Medicine)    REASON FOR CONSULTATION: Mark Donaldson is a 59 y.o. 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  Patient was found to have a large left renal mass with necrotic lymphadenopathy in the mediastinum and right suprahilar region concerning for metastatic disease.  Patient underwent left radical nephrectomy on 05/05/2019.  Plan was for bronchoscopy and lymph node biopsy.  However, palliative care was consulted to help address goals given concern for procedural risk.  SOCIAL HISTORY:     reports that he has never smoked. He has never used smokeless tobacco. He reports current alcohol use.   Patient is married and lives at home with his wife and 2 sons.  In total, patient has 3 sons and a daughter.  Patient works at [employer].  ADVANCE DIRECTIVES:  Does not have  CODE STATUS: Full code  PAST MEDICAL HISTORY: Past Medical History:  Diagnosis Date  . 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 CAD (coronary artery disease)   . Essential hypertension   . HFrEF (heart failure with reduced ejection fraction) ([place])   . Hyperlipidemia LDL goal <70   . Ischemic cardiomyopathy   . Renal disorder     PAST SURGICAL HISTORY:  Past Surgical History:  Procedure Laterality Date  . CARDIAC SURGERY     cardiac cath with 2 stent placement  . LAPAROSCOPIC NEPHRECTOMY, HAND ASSISTED Left 05/05/2019   Procedure: HAND ASSISTED LAPAROSCOPIC NEPHRECTOMY;  Surgeon: Hollice Espy, MD;  Location: [facility] ORS;  Service: Urology;  Laterality: Left;     HEMATOLOGY/ONCOLOGY HISTORY:  Oncology History   No history exists.    ALLERGIES:  has No Known Allergies.  MEDICATIONS:  Current Facility-Administered Medications  Medication Dose Route Frequency Provider Last Rate Last Admin  . 0.9 %  sodium chloride infusion   Intravenous Continuous Athena Masse, MD 125 mL/hr at 05/06/19 1300 New Bag at 05/06/19 1300  . 0.9 %  sodium chloride infusion   Intravenous PRN Louellen Molder, MD   Stopped at 05/05/19 0615  . acetaminophen (TYLENOL) tablet 650 mg  650 mg Oral Q6H PRN Athena Masse, MD   650 mg at 05/04/19 2215   Or  . acetaminophen (TYLENOL) suppository 650 mg  650 mg Rectal Q6H PRN Athena Masse, MD      . amLODipine (NORVASC) tablet 5 mg  5 mg Oral Daily Shahmehdi, Seyed A, MD   5 mg at 05/06/19 1259  . atorvastatin (LIPITOR) tablet 80 mg  80 mg Oral QHS Dhungel, Nishant, MD   80 mg at 05/05/19 2140  . Chlorhexidine Gluconate Cloth 2 % PADS 6 each  6 each Topical Q0600 Shahmehdi, Seyed A, MD      . hydrALAZINE (APRESOLINE) injection 10 mg  10 mg Intravenous Q6H PRN Dhungel, Nishant, MD   10 mg at 05/05/19 0611  . metoprolol succinate (TOPROL-XL) 24 hr tablet 150 mg  150 mg Oral Daily Dhungel, Nishant, MD   150 mg at 05/06/19 0826  . morphine 4 MG/ML injection 4 mg  4 mg Intravenous Q2H PRN Athena Masse, MD  4 mg at 05/06/19 1251  . ondansetron (ZOFRAN) tablet 4 mg  4 mg Oral Q6H PRN Athena Masse, MD       Or  . ondansetron [facility]) injection 4 mg  4 mg Intravenous Q6H PRN Athena Masse, MD        VITAL SIGNS: BP (!) 156/91 (BP Location: Right Arm)   Pulse 70   Temp 98.4 F (36.9 C)   Resp 16   Ht '5\' 5"'$  (1.651 m)   Wt 154 lb 1.6 oz (69.9 kg)   SpO2 97%   BMI 25.64 kg/m  Filed Weights   05/01/19 2336 05/02/19 0541 05/05/19 0645  Weight: 158 lb (71.7 kg) 154 lb 1.6 oz (69.9 kg) 154 lb 1.6 oz (69.9 kg)    Estimated body mass index is 25.64 kg/m as calculated from the following:   Height as of this encounter:  '5\' 5"'$  (1.651 m).   Weight as of this encounter: 154 lb 1.6 oz (69.9 kg).  LABS: CBC:    Component Value Date/Time   WBC 11.6 (H) 05/06/2019 0449   HGB 12.1 (L) 05/06/2019 0449   HCT 37.2 (L) 05/06/2019 0449   PLT 173 05/06/2019 0449   MCV 94.2 05/06/2019 0449   Comprehensive Metabolic Panel:    Component Value Date/Time   NA 140 05/06/2019 0449   K 4.5 05/06/2019 0449   CL 109 05/06/2019 0449   CO2 25 05/06/2019 0449   BUN 16 05/06/2019 0449   CREATININE 1.67 (H) 05/06/2019 0449   GLUCOSE 100 (H) 05/06/2019 0449   CALCIUM 7.8 (L) 05/06/2019 0449   AST 28 05/03/2019 0531   ALT 20 05/03/2019 0531   ALKPHOS 72 05/03/2019 0531   BILITOT 0.9 05/03/2019 0531   PROT 6.2 (L) 05/03/2019 0531   ALBUMIN 3.0 (L) 05/03/2019 0531    RADIOGRAPHIC STUDIES: CT ABDOMEN PELVIS W WO CONTRAST  Result Date: 05/03/2019 CLINICAL DATA:  Renal mass. EXAM: CT CHEST WITH CONTRAST CT ABDOMEN AND PELVIS WITH AND WITHOUT CONTRAST TECHNIQUE: Multidetector CT imaging of the chest was performed during intravenous contrast administration. Multidetector CT imaging of the abdomen and pelvis was performed following the standard protocol before and during bolus administration of intravenous contrast. CONTRAST:  114m OMNIPAQUE IOHEXOL 300 MG/ML  SOLN COMPARISON:  05/02/2019 CT stone study. FINDINGS: CT CHEST FINDINGS Cardiovascular: The heart size is normal. No substantial pericardial effusion. Coronary artery calcification is evident. No thoracic aortic aneurysm. Mediastinum/Nodes: Necrotic lymph nodes are identified in the right mediastinum along the pleural surface and right paratracheal space. Nodal conglomeration to the right of the trachea measures 3.5 x 1.9 cm on 22/3. No left hilar lymphadenopathy. The esophagus has normal imaging features. There is no axillary lymphadenopathy. 15 mm short axis right retrocrural node is visible on 34/18. Lungs/Pleura: 1.7 cm perifissural nodule noted on the minor fissure (82/5).  8 mm posterior left lower lobe nodule seen on 78/5. 5 mm medial right upper lobe nodule visible on 57/5. Dependent atelectasis noted in both lower lobes. Tiny bilateral pleural effusions evident. Musculoskeletal: No worrisome lytic or sclerotic osseous abnormality. CT ABDOMEN AND PELVIS FINDINGS Hepatobiliary: No suspicious focal abnormality within the liver parenchyma. There is no evidence for gallstones, gallbladder wall thickening, or pericholecystic fluid. No intrahepatic or extrahepatic biliary dilation. Pancreas: No focal mass lesion. No dilatation of the main duct. No intraparenchymal cyst. No peripancreatic edema. Spleen: No splenomegaly. No focal mass lesion. Adrenals/Urinary Tract: No adrenal nodule or mass. Right kidney unremarkable. 6.4 x 6.7 x  7.0 cm heterogeneously enhancing mass is identified in the lower pole left kidney. Lesion extends into the central sinus fat and displaces the renal pelvis and proximal ureter posteriorly. Left renal vein is patent. No evidence for hydroureter. The urinary bladder appears normal for the degree of distention. Stomach/Bowel: Stomach is unremarkable. No gastric wall thickening. No evidence of outlet obstruction. Duodenum is normally positioned as is the ligament of Treitz. No small bowel wall thickening. No small bowel dilatation. The terminal ileum is normal. The appendix is normal. No gross colonic mass. No colonic wall thickening. Vascular/Lymphatic: No abdominal aortic aneurysm. 9 mm left para-aortic node on 66/18 is not enlarged by CT criteria but is suspicious for metastatic disease. No pelvic sidewall lymphadenopathy. Reproductive: The prostate gland and seminal vesicles are unremarkable. Other: No intraperitoneal free fluid. Musculoskeletal: No worrisome lytic or sclerotic osseous abnormality. 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 IMPRESSION: 1. 7 cm heterogeneous mass lesion in the lower pole left kidney is  compatible with renal cell carcinoma. Mass generates mass-effect on the left renal pelvis and proximal left ureter. Left renal vein patent. 2. Necrotic lymphadenopathy in the mediastinum and right suprahilar region is highly suspicious for metastatic disease. Bilateral pulmonary nodules also raise concern for metastatic involvement. 3. Right retrocrural lymphadenopathy with upper normal left para-aortic lymph node also suggest metastatic involvement. 4. 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 Electronically Signed   By: Misty Stanley M.D.   On: 05/03/2019 13:35   CT CHEST W CONTRAST  Result Date: 05/03/2019 CLINICAL DATA:  Renal mass. EXAM: CT CHEST WITH CONTRAST CT ABDOMEN AND PELVIS WITH AND WITHOUT CONTRAST TECHNIQUE: Multidetector CT imaging of the chest was performed during intravenous contrast administration. Multidetector CT imaging of the abdomen and pelvis was performed following the standard protocol before and during bolus administration of intravenous contrast. CONTRAST:  142m OMNIPAQUE IOHEXOL 300 MG/ML  SOLN COMPARISON:  05/02/2019 CT stone study. FINDINGS: CT CHEST FINDINGS Cardiovascular: The heart size is normal. No substantial pericardial effusion. Coronary artery calcification is evident. No thoracic aortic aneurysm. Mediastinum/Nodes: Necrotic lymph nodes are identified in the right mediastinum along the pleural surface and right paratracheal space. Nodal conglomeration to the right of the trachea measures 3.5 x 1.9 cm on 22/3. No left hilar lymphadenopathy. The esophagus has normal imaging features. There is no axillary lymphadenopathy. 15 mm short axis right retrocrural node is visible on 34/18. Lungs/Pleura: 1.7 cm perifissural nodule noted on the minor fissure (82/5). 8 mm posterior left lower lobe nodule seen on 78/5. 5 mm medial right upper lobe nodule visible on 57/5. Dependent atelectasis noted in both lower lobes. Tiny bilateral pleural effusions evident.  Musculoskeletal: No worrisome lytic or sclerotic osseous abnormality. CT ABDOMEN AND PELVIS FINDINGS Hepatobiliary: No suspicious focal abnormality within the liver parenchyma. There is no evidence for gallstones, gallbladder wall thickening, or pericholecystic fluid. No intrahepatic or extrahepatic biliary dilation. Pancreas: No focal mass lesion. No dilatation of the main duct. No intraparenchymal cyst. No peripancreatic edema. Spleen: No splenomegaly. No focal mass lesion. Adrenals/Urinary Tract: No adrenal nodule or mass. Right kidney unremarkable. 6.4 x 6.7 x 7.0 cm heterogeneously enhancing mass is identified in the lower pole left kidney. Lesion extends into the central sinus fat and displaces the renal pelvis and proximal ureter posteriorly. Left renal vein is patent. No evidence for hydroureter. The urinary bladder appears normal for the degree of distention. Stomach/Bowel: Stomach is unremarkable. No gastric wall thickening. No evidence of  outlet obstruction. Duodenum is normally positioned as is the ligament of Treitz. No small bowel wall thickening. No small bowel dilatation. The terminal ileum is normal. The appendix is normal. No gross colonic mass. No colonic wall thickening. Vascular/Lymphatic: No abdominal aortic aneurysm. 9 mm left para-aortic node on 66/18 is not enlarged by CT criteria but is suspicious for metastatic disease. No pelvic sidewall lymphadenopathy. Reproductive: The prostate gland and seminal vesicles are unremarkable. Other: No intraperitoneal free fluid. Musculoskeletal: No worrisome lytic or sclerotic osseous abnormality. 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 IMPRESSION: 1. 7 cm heterogeneous mass lesion in the lower pole left kidney is compatible with renal cell carcinoma. Mass generates mass-effect on the left renal pelvis and proximal left ureter. Left renal vein patent. 2. Necrotic lymphadenopathy in the mediastinum and right  suprahilar region is highly suspicious for metastatic disease. Bilateral pulmonary nodules also raise concern for metastatic involvement. 3. Right retrocrural lymphadenopathy with upper normal left para-aortic lymph node also suggest metastatic involvement. 4. 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 Electronically Signed   By: Misty Stanley M.D.   On: 05/03/2019 13:35   ECHOCARDIOGRAM COMPLETE  Result Date: 05/04/2019    ECHOCARDIOGRAM REPORT   Patient Name:   WENDLE KINA Date of Exam: 05/04/2019 Medical Rec #:  [id number]                Height:       65.0 in Accession #:    [id number]               Weight:       154.1 lb Date of Birth:  1960-12-03               BSA:          1.771 m Patient Age:    60 years                 BP:           153/88 mmHg Patient Gender: M                        HR:           75 bpm. Exam Location:  [facility] Procedure: 2D Echo, Cardiac Doppler and Color Doppler Indications:     CHF- 428.0  History:         Patient has no prior history of Echocardiogram examinations.                  Risk Factors:Hypertension. Ischemic cardiomyopathy.  Sonographer:     Sherrie Sport RDCS (AE) Referring Phys:  [facility] Diagnosing Phys: Kate Sable MD IMPRESSIONS  1. Left ventricular ejection fraction, by estimation, is 60 to 65%. The left ventricle has normal function. The left ventricle has no regional wall motion abnormalities. There is mild left ventricular hypertrophy. Left ventricular diastolic parameters are consistent with Grade I diastolic dysfunction (impaired relaxation).  2. Right ventricular systolic function is normal. The right ventricular size is normal.  3. Left atrial size was mildly dilated.  4. The mitral valve is normal in structure. Trivial mitral valve regurgitation.  5. The aortic valve is tricuspid. Aortic valve regurgitation is not visualized. Mild aortic valve sclerosis is present, with no evidence of aortic valve stenosis. FINDINGS   Left Ventricle: Left ventricular ejection fraction, by estimation, is 60 to 65%. The left ventricle has normal function. The left  ventricle has no regional wall motion abnormalities. The left ventricular internal cavity size was normal in size. There is  mild left ventricular hypertrophy. Left ventricular diastolic parameters are consistent with Grade I diastolic dysfunction (impaired relaxation). Right Ventricle: The right ventricular size is normal. Right vetricular wall thickness was not assessed. Right ventricular systolic function is normal. Left Atrium: Left atrial size was mildly dilated. Right Atrium: Right atrial size was normal in size. Pericardium: There is no evidence of pericardial effusion. Mitral Valve: The mitral valve is normal in structure. Trivial mitral valve regurgitation. Tricuspid Valve: The tricuspid valve is normal in structure. Tricuspid valve regurgitation is not demonstrated. Aortic Valve: The aortic valve is tricuspid. Aortic valve regurgitation is not visualized. Mild aortic valve sclerosis is present, with no evidence of aortic valve stenosis. Aortic valve mean gradient measures 1.5 mmHg. Aortic valve peak gradient measures 3.4 mmHg. Aortic valve area, by VTI measures 3.33 cm. Pulmonic Valve: The pulmonic valve was normal in structure. Pulmonic valve regurgitation is not visualized. Aorta: The aortic root is normal in size and structure. Venous: The inferior vena cava was not well visualized. IAS/Shunts: No atrial level shunt detected by color flow Doppler.  LEFT VENTRICLE PLAX 2D LVIDd:         4.68 cm  Diastology LVIDs:         2.65 cm  LV e' lateral:   8.27 cm/s LV PW:         1.35 cm  LV E/e' lateral: 7.9 LV IVS:        1.37 cm  LV e' medial:    6.42 cm/s LVOT diam:     2.00 cm  LV E/e' medial:  10.1 LV SV:         64 LV SV Index:   36 LVOT Area:     3.14 cm  RIGHT VENTRICLE RV Basal diam:  3.80 cm RV S prime:     10.20 cm/s TAPSE (M-mode): 3.2 cm LEFT ATRIUM             Index        RIGHT ATRIUM           Index LA diam:        4.40 cm 2.48 cm/m  RA Area:     13.40 cm LA Vol (A2C):   60.7 ml 34.28 ml/m RA Volume:   30.70 ml  17.34 ml/m LA Vol (A4C):   69.9 ml 39.48 ml/m LA Biplane Vol: 66.0 ml 37.27 ml/m  AORTIC VALVE                   PULMONIC VALVE AV Area (Vmax):    2.98 cm    PV Vmax:        0.79 m/s AV Area (Vmean):   3.30 cm    PV Peak grad:   2.5 mmHg AV Area (VTI):     3.33 cm    RVOT Peak grad: 2 mmHg AV Vmax:           91.80 cm/s AV Vmean:          54.900 cm/s AV VTI:            0.192 m AV Peak Grad:      3.4 mmHg AV Mean Grad:      1.5 mmHg LVOT Vmax:         87.20 cm/s LVOT Vmean:        57.600 cm/s LVOT VTI:  0.203 m LVOT/AV VTI ratio: 1.06  AORTA Ao Root diam: 2.90 cm MITRAL VALVE MV Area (PHT): 2.62 cm    SHUNTS MV Decel Time: 290 msec    Systemic VTI:  0.20 m MV E velocity: 65.00 cm/s  Systemic Diam: 2.00 cm MV A velocity: 91.70 cm/s MV E/A ratio:  0.71 Kate Sable MD Electronically signed by Kate Sable MD Signature Date/Time: 05/04/2019/6:34:27 PM    Final    CT Renal Stone Study  Result Date: 05/02/2019 CLINICAL DATA:  Left flank pain, lower back pain EXAM: CT ABDOMEN AND PELVIS WITHOUT CONTRAST TECHNIQUE: Multidetector CT imaging of the abdomen and pelvis was performed following the standard protocol without IV contrast. COMPARISON:  None. FINDINGS: Lower chest: Lung bases are clear. No effusions. Heart is normal size. Hepatobiliary: No focal hepatic abnormality. Gallbladder unremarkable. Pancreas: No focal abnormality or ductal dilatation. Spleen: No focal abnormality.  Normal size. Adrenals/Urinary Tract: Large solid mass is seen in the lower pole of the left kidney measuring 7.4 x 6.9 cm. Central calcifications noted. Findings concerning for renal cell carcinoma. There is mild left hydronephrosis which may be related to mass effect from the mass on the adjacent proximal left ureter. High-density material is seen within the left renal  collecting system and ureter compatible with blood. No hydronephrosis on the right. Adrenal glands are unremarkable. Bladder wall appears diffusely thickened. Stomach/Bowel: Normal appendix. Stomach, large and small bowel grossly unremarkable. Vascular/Lymphatic: No evidence of aneurysm or adenopathy. Reproductive: Prostate enlargement. Other: 2 No free fluid or free air. Musculoskeletal: No acute bony abnormality. IMPRESSION: Large solid mass involving the lower pole of the left kidney concerning for renal cell carcinoma measuring up to 7.4 cm. Mild left hydronephrosis, possibly related to mass effect or blood clot within the left ureter. Diffusely thick walled bladder, likely related to prostate enlargement. Electronically Signed   By: Rolm Baptise M.D.   On: 05/02/2019 01:24    PERFORMANCE STATUS (ECOG) : 1 - Symptomatic but completely ambulatory  Review of Systems Unless otherwise noted, a complete review of systems is negative.  Physical Exam General: NAD Pulmonary: Unlabored Extremities: no edema, no joint deformities Skin: no rashes Neurological: Weakness but otherwise nonfocal  IMPRESSION: I met with patient and his son using a video interpreter.  I introduced palliative care services and attempted to establish therapeutic rapport.  Together, we reviewed patient's current medical problems.  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  However, he would need a lymph node biopsy via bronchoscopy to confirm.  Patient says that he met earlier today with pulmonary and the hospitalist to discuss further work-up.  Has decided to forego biopsy at the present time.  He says that he receives all of his other medical care at [facility] and would like to transfer care there for outpatient work-up.  Symptomatically, patient has some occasional pain but says that his pain has been well controlled with as needed morphine.  Would recommend transitioning to oral pain meds in anticipation of  discharge.  Patient says that his goals are aligned with treatment if any are indicated.  He would be interested in consideration of chemotherapy but would want to follow with [facility] medical oncology.  PLAN: -Full scope/full code -Patient would like to transfer care to [facility] -Recommend trial of Percocet 5-'325mg'$  every 4 hours as needed for pain -Prophylactic bowel regimen -Recommend referral to outpatient palliative care at [facility]  Case and plan discussed with Dr. Tasia Catchings  Time Total: 60  minutes  Visit consisted of counseling and education dealing with the complex and emotionally intense issues of symptom management and palliative care in the setting of serious and potentially life-threatening illness.Greater than 50%  of this time was spent counseling and coordinating care related to the above assessment and plan.  Signed by: Altha Harm, PhD, NP-C

## 2019-05-06 NOTE — Progress Notes (Signed)
   Subjective Visit this morning conducted with video Spanish translator No acute events overnight, vital stable, good urine output Pain moderately to well controlled Tolerating clears without any nausea or vomiting, passing flatus  Physical Exam: BP (!) 174/92   Pulse 80   Temp 98.6 F (37 C) (Oral)   Resp 17   Ht 5\' 5"  (1.651 m)   Wt 69.9 kg   SpO2 100%   BMI 25.64 kg/m    Constitutional:  Alert and oriented, No acute distress. Respiratory: Normal respiratory effort, no increased work of breathing. GI: Abdomen is soft, appropriately-tender, non-distended, left lower quadrant incision clean dry and intact with Dermabond, midline port sites clean and intact with Dermabond GU: Foley with clear yellow urine  Laboratory Data: Reviewed in epic  Assessment & Plan:   Patient is a 59 year old male who presented with gross hematuria and was found to have a left renal mass. POD#1 left hand-assisted laparoscopic radical nephrectomy for a 7 cm renal mass with Dr. Erlene Quan.  Patient also has necrotic lymphadenopathy in the mediastinum and right suprahilar region suspicious for metastatic disease.  Recovering appropriately.  -Advance to general diet as tolerated remove Foley -Okay for discharge from a urology perspective this evening versus tomorrow if tolerating diet and pain controlled with oral pain medications -Will arrange follow-up with Dr. Erlene Quan and oncology  Billey Co, MD

## 2019-05-06 NOTE — Progress Notes (Signed)
PROGRESS NOTE    Mark Donaldson  ERX:[id number] DOB: Jun 05, 1960 DOA: 05/02/2019 PCP: Frazier Richards, MD    Chief Complaint  Patient presents with  . Flank Pain  . Hematuria    Subjective: Postop day #1 Status post laparoscopic assisted left radical     The patient was seen and examined this morning remained stable, was trying to have his breakfast. Staff present at bedside who also speaks [place]. All findings plan of care was discussed in detail with him through his nurse who interpreted in detail. Aside from mild abdominal tenderness has no other complaints.   Denies any fever,  Chills,  Nausea,  vomiting. Reporting some gas but no bowel movements yet.   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  UA showed significant RBC, WBC and bacteria.  CT renal study showed 7.4 cm left lower pole renal mass concerning for renal cell carcinoma and mild left hydronephrosis secondary to mass-effect versus blood clot within the left ureter.  Also had thick-walled bladder possibly secondary to enlarged prostate. Patient septic with tachycardia, tachypnea, uncontrolled hypertension and lactic acidosis and ATN.  Placed on empiric IV Rocephin, IV fluid bolus.  Culture sent and admitted to hospital service.  -Status post treatment for sepsis due to UTI -Status post a radical nephrectomy on 05/05/2019 -Urology, nephrology, pulmonology following  closely -Pending pathology report -Pending plan for pulmonology regarding bronchoscopy and biopsy  -------------------------------------------------------------------------------------------------------------------------------- Assessment & Plan:   Principal Problem: Severe sepsis with acute target organ damage. -Improved, hemodynamically stable - Resolved, afebrile normotensive, improved leukocytosis, lactic acidosis Secondary to UTI.  -Left lower abdominal and flank pain resolved as well.   Plan to discontinue IV Rocephin for 5-day treatment -Urine culture with mixed bacteria.   Left renal mass suspicious of RCC and hydronephrosis of left kidney. -Appreciate urology following closely  -05/05/2019: Postop day #1 Status post laparoscopic assisted left radical nephrectomy  - (Plavix still on hold -awaiting urology input in regards of reinitiating timeline from Plavix).  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  Follows with [facility] cardiology. Currently euvolemic.  - Aspirin and Plavix still on hold, pending clearance from surgical standpoint to restart -Continue statin and beta-blocker.   Holding lisinopril and Aldactone due to AKI.  Patient seen by cardiology for preop clearance and recommends moderate risk for surgery.   Follow 2D echo.... Pending final result   No further preoperative cardiac testing needed.  Acute tubular necrosis Likely prerenal with sepsis + mild left-sided hydronephrosis.  Monitor with fluids.   Avoid ACE  inhibitor/Aldactone and nephrotoxins.  Uncontrolled hypertension Likely associated with pain and sepsis.  Continue metoprolol and as needed hydralazine.  Gross hematuria Likely from [place].  Currently resolved.  H&H stable. Plan as above, pursuing  DVT prophylaxis: SCDs Code Status: Full code Family Communication: Discussed with son on 4/19. Disposition: Plan on surgery on 4/22  Status is: Inpatient    Dispo: The patient is from: Home              Anticipated d/c is to: Home with home health              Anticipated d/c date is: > 3 days              Patient currently is not medically stable to d/c.  Patient requires inpatient monitoring  radical nephrectomy  on 05/05/19.  Oncology follow-up  ? Pulmonology, bronchoscopy for biopsy of lymph node    Consultants:   Urology  Oncology  Cardiology  Pulmonologist   Procedures:   CT renal study, 2D echo  05/05/2019 - status post laparoscopic assisted left radical nephrectomy   Antimicrobials:  IV Rocephin    Objective: Vitals:   05/05/19 1604 05/05/19 2045 05/06/19 0459 05/06/19 0825  BP: (!) 131/93 (!) 147/92 (!) 146/93 (!) 174/92  Pulse: 85 94 64 80  Resp: 16 20  17   Temp: 98.4 F (36.9 C) 99.2 F (37.3 C) 98.4 F (36.9 C) 98.6 F (37 C)  TempSrc: Oral Oral Oral Oral  SpO2: 99% 98% 100% 100%  Weight:      Height:        Intake/Output Summary (Last 24 hours) at 05/06/2019 1052 Last data filed at 05/06/2019 1032 Gross per 24 hour  Intake 4116.6 ml  Output 1550 ml  Net 2566.6 ml   Filed Weights   05/01/19 2336 05/02/19 0541 05/05/19 0645  Weight: 71.7 kg 69.9 kg 69.9 kg     Physical Exam  Constitution:  Alert, cooperative, no distress,  Psychiatric: Normal and stable mood and affect,  cognition intact,   HEENT: Normocephalic, PERRL, otherwise with in Normal limits  Chest:Chest symmetric Cardio vascular:  S1/S2, RRR, No murmure, No Rubs or Gallops  pulmonary: Clear to auscultation bilaterally, respirations unlabored, negative wheezes / crackles Abdomen: Soft, mild diffuse tenderness, surgical sites are clean , non-distended, bowel sounds,no masses, no organomegaly Muscular skeletal: Limited exam - in bed, able to move all 4 extremities, Normal strength,  Neuro: CNII-XII intact. , normal motor and sensation, reflexes intact  Extremities: No pitting edema lower extremities, +2 pulses  Skin: Dry, warm to touch, negative for any Rashes, No open wounds Wounds: Abdominal surgical wounds clean, negative edema erythema or drainage          Data Reviewed: I have personally reviewed following labs and imaging studies  CBC: Recent Labs  Lab 05/01/19 2344 05/02/19 0629 05/03/19 0531 05/06/19 0449  WBC 13.7* 11.1* 10.4 11.6*  HGB 14.6 14.8 14.5 12.1*  HCT 43.2 41.9 43.1 37.2*  MCV 89.4 86.2 90.2 94.2  PLT 185 173 175 841    Basic Metabolic Panel: Recent Labs  Lab 05/01/19 2344 05/03/19 0531 05/05/19 0600 05/06/19 0449  NA 130* 139 142 140  K 3.7 4.3 3.8 4.5  CL 96* 107 111 109  CO2 22 27 23 25   GLUCOSE 106* 99 99 100*  BUN 17 15 11 16   CREATININE 1.29* 1.08 0.95 1.67*  CALCIUM 8.5* 8.3* 8.4* 7.8*    GFR: Estimated Creatinine Clearance: 42.5 mL/min (A) (by  C-G formula based on SCr of 1.67 mg/dL (H)).  Liver Function Tests: Recent Labs  Lab 05/03/19 0531  AST 28  ALT 20  ALKPHOS 72  BILITOT 0.9  PROT 6.2*  ALBUMIN 3.0*    CBG: No results for input(s): GLUCAP in the last 168 hours.   Recent Results (from the past 240 hour(s))  Urine culture     Status: Abnormal   Collection Time: 05/01/19 11:44 PM   Specimen: In/Out Cath Urine  Result Value Ref Range Status   Specimen Description   Final    IN/OUT CATH URINE Performed at [facility], [address], [place], [place] [address]    Special Requests   Final    NONE Performed at [facility], [address] [place], [place] [address]    Culture MULTIPLE SPECIES PRESENT, SUGGEST RECOLLECTION (A)  Final   Report Status 05/03/2019 FINAL  Final  Blood culture (routine x 2)     Status: None (Preliminary result)   Collection Time: 05/02/19  2:33 AM   Specimen: BLOOD  Result Value Ref Range Status   Specimen Description BLOOD LEFT WRIST  Final   Special Requests   Final    BOTTLES DRAWN AEROBIC AND ANAEROBIC Blood Culture adequate volume   Culture   Final    NO GROWTH 2 DAYS Performed at [facility], [address], [place], [place] [address]    Report Status PENDING  Incomplete  Blood culture (routine x 2)     Status: None (Preliminary result)   Collection Time: 05/02/19  2:34 AM   Specimen: BLOOD  Result Value Ref Range Status   Specimen Description BLOOD LEFT [facility]  Final   Special Requests   Final    BOTTLES DRAWN AEROBIC AND ANAEROBIC Blood Culture adequate volume   Culture   Final    NO GROWTH 2 DAYS Performed at [facility], [address] [place], [place] [address]    Report Status PENDING  Incomplete  Respiratory Panel by RT PCR (Flu A&B, Covid) - Nasopharyngeal Swab     Status: None   Collection Time: 05/02/19  2:34 AM   Specimen: Nasopharyngeal Swab  Result Value Ref Range Status   SARS Coronavirus 2 by RT PCR NEGATIVE NEGATIVE Final    Comment: (NOTE) S    Influenza A by PCR NEGATIVE NEGATIVE Final   Influenza B by PCR NEGATIVE NEGATIVE Final      Radiology Studies: ECHOCARDIOGRAM COMPLETE  Result Date: 05/04/2019    ECHOCARDIOGRAM REPORT   Patient Name:   AHIJAH DEVERY Date of Exam: 05/04/2019 Medical Rec #:  [id number]                Height:       65.0 in Accession #:    [id number]               Weight:       154.1 lb Date of Birth:  07-Nov-1960               BSA:          1.771 m Patient  Age:    59 years                 BP:           153/88 mmHg Patient Gender: M  HR:           75 bpm. Exam Location:  [facility] Procedure: 2D Echo, Cardiac Doppler and Color Doppler Indications:     CHF- 428.0  History:         Patient has no prior history of Echocardiogram examinations.                  Risk Factors:Hypertension. Ischemic cardiomyopathy.  Sonographer:     Sherrie Sport RDCS (AE) Referring Phys:  [facility] Diagnosing Phys: Kate Sable MD IMPRESSIONS  1. Left ventricular ejection fraction, by estimation, is 60 to 65%. The left ventricle has normal function. The left ventricle has no regional wall motion abnormalities. There is mild left ventricular hypertrophy. Left ventricular diastolic parameters are consistent with Grade I diastolic dysfunction (impaired relaxation).  2. Right ventricular systolic function is normal. The right ventricular size is normal.  3. Left atrial size was mildly dilated.  4. The mitral valve is normal in structure. Trivial mitral valve regurgitation.  5. The aortic valve is tricuspid. Aortic valve regurgitation is not visualized. Mild aortic valve sclerosis is present, with no evidence of aortic valve stenosis. FINDINGS  Left Ventricle: Left ventricular ejection fraction, by estimation, is 60 to 65%. The left ventricle has normal function. The left ventricle has no regional wall motion abnormalities. The left ventricular internal cavity size was normal in size. There is  mild left ventricular hypertrophy. Left ventricular diastolic parameters are consistent with Grade I diastolic dysfunction (impaired relaxation). Right Ventricle: The right ventricular size is normal. Right vetricular wall thickness was not assessed. Right ventricular systolic function is normal. Left Atrium: Left atrial size was mildly dilated. Right Atrium: Right atrial size was normal in size. Pericardium: There is no evidence of pericardial effusion. Mitral Valve: The mitral  valve is normal in structure. Trivial mitral valve regurgitation. Tricuspid Valve: The tricuspid valve is normal in structure. Tricuspid valve regurgitation is not demonstrated. Aortic Valve: The aortic valve is tricuspid. Aortic valve regurgitation is not visualized. Mild aortic valve sclerosis is present, with no evidence of aortic valve stenosis. Aortic valve mean gradient measures 1.5 mmHg. Aortic valve peak gradient measures 3.4 mmHg. Aortic valve area, by VTI measures 3.33 cm. Pulmonic Valve: The pulmonic valve was normal in structure. Pulmonic valve regurgitation is not visualized. Aorta: The aortic root is normal in size and structure. Venous: The inferior vena cava was not well visualized. IAS/Shunts: No atrial level shunt detected by color flow Doppler.  LEFT VENTRICLE PLAX 2D LVIDd:         4.68 cm  Diastology LVIDs:         2.65 cm  LV e' lateral:   8.27 cm/s LV PW:         1.35 cm  LV E/e' lateral: 7.9 LV IVS:        1.37 cm  LV e' medial:    6.42 cm/s LVOT diam:     2.00 cm  LV E/e' medial:  10.1 LV SV:         64 LV SV Index:   36 LVOT Area:     3.14 cm  RIGHT VENTRICLE RV Basal diam:  3.80 cm RV S prime:     10.20 cm/s TAPSE (M-mode): 3.2 cm LEFT ATRIUM             Index       RIGHT ATRIUM           Index LA diam:  4.40 cm 2.48 cm/m  RA Area:     13.40 cm LA Vol (A2C):   60.7 ml 34.28 ml/m RA Volume:   30.70 ml  17.34 ml/m LA Vol (A4C):   69.9 ml 39.48 ml/m LA Biplane Vol: 66.0 ml 37.27 ml/m  AORTIC VALVE                   PULMONIC VALVE AV Area (Vmax):    2.98 cm    PV Vmax:        0.79 m/s AV Area (Vmean):   3.30 cm    PV Peak grad:   2.5 mmHg AV Area (VTI):     3.33 cm    RVOT Peak grad: 2 mmHg AV Vmax:           91.80 cm/s AV Vmean:          54.900 cm/s AV VTI:            0.192 m AV Peak Grad:      3.4 mmHg AV Mean Grad:      1.5 mmHg LVOT Vmax:         87.20 cm/s LVOT Vmean:        57.600 cm/s LVOT VTI:          0.203 m LVOT/AV VTI ratio: 1.06  AORTA Ao Root diam: 2.90 cm MITRAL  VALVE MV Area (PHT): 2.62 cm    SHUNTS MV Decel Time: 290 msec    Systemic VTI:  0.20 m MV E velocity: 65.00 cm/s  Systemic Diam: 2.00 cm MV A velocity: 91.70 cm/s MV E/A ratio:  0.71 Kate Sable MD Electronically signed by Kate Sable MD Signature Date/Time: 05/04/2019/6:34:27 PM    Final         Scheduled Meds: . atorvastatin  80 mg Oral QHS  . Chlorhexidine Gluconate Cloth  6 each Topical Q0600  . metoprolol succinate  150 mg Oral Daily   Continuous Infusions: . sodium chloride 125 mL/hr at 05/06/19 0841  . sodium chloride Stopped (05/05/19 0615)  . cefTRIAXone (ROCEPHIN)  IV 1 g (05/06/19 0329)     LOS: 4 days    Time spent: 35 minutes    Deatra James, MD [facility]   To contact the attending provider between 7A-7P or the covering provider during after hours 7P-7A, please log into the web site [email or web address] and access using universal [place] password for that web site. If you do not have the password, please call the hospital operator.  05/06/2019, 10:52 AM

## 2019-05-07 DIAGNOSIS — N179 Acute kidney failure, unspecified: Secondary | ICD-10-CM

## 2019-05-07 DIAGNOSIS — I5032 Chronic diastolic (congestive) heart failure: Secondary | ICD-10-CM

## 2019-05-07 DIAGNOSIS — Z9861 Coronary angioplasty status: Secondary | ICD-10-CM

## 2019-05-07 DIAGNOSIS — Z789 Other specified health status: Secondary | ICD-10-CM

## 2019-05-07 DIAGNOSIS — N1330 Unspecified hydronephrosis: Secondary | ICD-10-CM

## 2019-05-07 DIAGNOSIS — R310 Gross hematuria: Secondary | ICD-10-CM

## 2019-05-07 LAB — BASIC METABOLIC PANEL
Anion gap: 7 (ref 5–15)
BUN: 17 mg/dL (ref 6–20)
CO2: 25 mmol/L (ref 22–32)
Calcium: 8.2 mg/dL — ABNORMAL LOW (ref 8.9–10.3)
Chloride: 106 mmol/L (ref 98–111)
Creatinine, Ser: 1.61 mg/dL — ABNORMAL HIGH (ref 0.61–1.24)
GFR calc Af Amer: 54 mL/min — ABNORMAL LOW (ref >60)
GFR calc non Af Amer: 47 mL/min — ABNORMAL LOW (ref >60)
Glucose, Bld: 101 mg/dL — ABNORMAL HIGH (ref 70–99)
Potassium: 4.6 mmol/L (ref 3.5–5.1)
Sodium: 138 mmol/L (ref 135–145)

## 2019-05-07 LAB — CULTURE, BLOOD (ROUTINE X 2)
Culture: NO GROWTH
Culture: NO GROWTH
Special Requests: ADEQUATE
Special Requests: ADEQUATE

## 2019-05-07 LAB — HEMOGLOBIN AND HEMATOCRIT, BLOOD
HCT: 39.1 % (ref 39.0–52.0)
Hemoglobin: 12.7 g/dL — ABNORMAL LOW (ref 13.0–17.0)

## 2019-05-07 MED ORDER — AMLODIPINE BESYLATE 10 MG PO TABS: 10.0000 mg | ORAL_TABLET | Freq: Every day | ORAL | 1 refills | Status: DC

## 2019-05-07 MED ORDER — FUROSEMIDE 20 MG PO TABS: 20.0000 mg | ORAL_TABLET | Freq: Every day | ORAL | 1 refills | Status: DC

## 2019-05-07 MED ORDER — HYDROCODONE-ACETAMINOPHEN 5-325 MG PO TABS: 1.0000 | ORAL_TABLET | Freq: Four times a day (QID) | ORAL | 0 refills | Status: AC | PRN

## 2019-05-07 MED ORDER — SENNOSIDES-DOCUSATE SODIUM 8.6-50 MG PO TABS: 1.0000 | ORAL_TABLET | Freq: Two times a day (BID) | ORAL | 0 refills | Status: DC | PRN

## 2019-05-07 NOTE — Progress Notes (Signed)
   Subjective Visit this morning conducted with video Spanish translator No acute events overnight, vitals stable, voiding yellow urine spontaneously Pain well controlled Tolerating general diet without any nausea or vomiting, passing flatus  Physical Exam: BP (!) 164/92   Pulse 72   Temp 98.9 F (37.2 C) (Oral)   Resp 20   Ht 5\' 5"  (1.651 m)   Wt 69.9 kg   SpO2 97%   BMI 25.64 kg/m    Constitutional:  Alert and oriented, No acute distress. Respiratory: Normal respiratory effort, no increased work of breathing. GI: Abdomen is soft, appropriately-tender, non-distended, left lower quadrant incision clean dry and intact with Dermabond, midline port sites clean and intact with Dermabond  Laboratory Data: Reviewed   Assessment & Plan:   Patient is a 59 year old male who presented with gross hematuria and was found to have a left renal mass. POD#1 left hand-assisted laparoscopic radical cytoreductive nephrectomy for a 7 cm renal mass with Dr. Erlene Quan.  Patient also has necrotic lymphadenopathy in the mediastinum and right suprahilar region suspicious for metastatic disease.  Recovering appropriately.   -Recommend repeat H/H this AM, and ok to resume anticoagulation if stable. OK for discharge from a urologic perspective. -Avoid NSAIDs in setting of solitary kidney, tylenol and norco for pain control, would recommend discharge with 15-20 Norco 5-325 -Will arrange follow-up with Dr. Erlene Quan and oncology  Billey Co, MD

## 2019-05-07 NOTE — Progress Notes (Signed)
Pulmonary Medicine          Date: 05/07/2019,   MRN# [id number] Mark Donaldson 1960-05-17     AdmissionWeight: 71.7 kg                 CurrentWeight: 69.9 kg      CHIEF COMPLAINT:   Metastatic cancer   SUBJECTIVE    Patient was seen at bedside.  With interpter service [name])    We had meeting yesterday with patient and son also with interpreter.  Present in meeting was RN and hospitalist Dr Roger Shelter.   We discussed the bronchoscopy and answered questions.  Patient clearly and after numerous times explaining wishes to not have any additional surgical intervention or testing/diagnositcs.  Additionally he wishes to know if he can still have therapy for cancer despite declining additional workup.  This was discussed with oncologist Dr Tasia Catchings and patient is a candidate and may still receive therapy which was communicated with him. This morning he asks for pain medication due to pain at surgcial site post nephrectomy.   PAST MEDICAL HISTORY   Past Medical History:  Diagnosis Date  . 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 CAD (coronary artery disease)   . Essential hypertension   . HFrEF (heart failure with reduced ejection fraction) ([place])   . Hyperlipidemia LDL goal <70   . Ischemic cardiomyopathy   . Renal disorder      SURGICAL HISTORY   Past Surgical History:  Procedure Laterality Date  . CARDIAC SURGERY     cardiac cath with 2 stent placement  . LAPAROSCOPIC NEPHRECTOMY, HAND ASSISTED Left 05/05/2019   Procedure: HAND ASSISTED LAPAROSCOPIC NEPHRECTOMY;  Surgeon: Hollice Espy, MD;  Location: [facility] ORS;  Service: Urology;  Laterality: Left;     FAMILY HISTORY   Family History  Problem Relation Age of Onset  . Cancer Mother   . Blindness Brother      SOCIAL HISTORY   Social History   Tobacco Use  . Smoking status: Never Smoker  . Smokeless tobacco: Never Used  Substance Use Topics  . Alcohol use: Yes  . Drug use: Not on file      MEDICATIONS    Home Medication:    Current Medication:  Current Facility-Administered Medications:  .  0.9 %  sodium chloride infusion, , Intravenous, Continuous, Athena Masse, MD, Last Rate: 125 mL/hr at 05/07/19 0341, New Bag at 05/07/19 0341 .  0.9 %  sodium chloride infusion, , Intravenous, PRN, Dhungel, Nishant, MD, Stopped at 05/05/19 0615 .  acetaminophen (TYLENOL) tablet 650 mg, 650 mg, Oral, Q6H PRN, 650 mg at 05/07/19 0954 **OR** acetaminophen (TYLENOL) suppository 650 mg, 650 mg, Rectal, Q6H PRN, Judd Gaudier V, MD .  amLODipine (NORVASC) tablet 5 mg, 5 mg, Oral, Daily, Shahmehdi, Seyed A, MD, 5 mg at 05/07/19 0954 .  atorvastatin (LIPITOR) tablet 80 mg, 80 mg, Oral, QHS, Dhungel, Nishant, MD, 80 mg at 05/06/19 2111 .  hydrALAZINE (APRESOLINE) injection 10 mg, 10 mg, Intravenous, Q6H PRN, Dhungel, Nishant, MD, 10 mg at 05/05/19 2458 .  metoprolol succinate (TOPROL-XL) 24 hr tablet 150 mg, 150 mg, Oral, Daily, Dhungel, Nishant, MD, 150 mg at 05/07/19 0954 .  morphine 4 MG/ML injection 4 mg, 4 mg, Intravenous, Q2H PRN, Athena Masse, MD, 4 mg at 05/07/19 0442 .  ondansetron (ZOFRAN) tablet 4 mg, 4 mg, Oral, Q6H PRN **OR** ondansetron (ZOFRAN) injection 4 mg, 4 mg, Intravenous, Q6H PRN, Athena Masse, MD  ALLERGIES   Patient has no known allergies.     REVIEW OF SYSTEMS    Review of Systems:  Gen:  Denies  fever, sweats, chills weigh loss  HEENT: Denies blurred vision, double vision, ear pain, eye pain, hearing loss, nose bleeds, sore throat Cardiac:  No dizziness, chest pain or heaviness, chest tightness,edema Resp:   Denies cough or sputum porduction, shortness of breath,wheezing, hemoptysis,  Gi: Denies swallowing difficulty, stomach pain, nausea or vomiting, diarrhea, constipation, bowel incontinence Gu:  Denies bladder incontinence, burning urine Ext:   Denies Joint pain, stiffness or swelling Skin: Denies  skin rash, easy bruising or bleeding or  hives Endoc:  Denies polyuria, polydipsia , polyphagia or weight change Psych:   Denies depression, insomnia or hallucinations   Other:  All other systems negative   VS: BP (!) 157/87 (BP Location: Right Arm)   Pulse 88   Temp 98.9 F (37.2 C) (Oral)   Resp 20   Ht 5\' 5"  (1.651 m)   Wt 69.9 kg   SpO2 98%   BMI 25.64 kg/m      PHYSICAL EXAM    GENERAL:NAD, no fevers, chills, no weakness no fatigue HEAD: Normocephalic, atraumatic.  EYES: Pupils equal, round, reactive to light. Extraocular muscles intact. No scleral icterus.  MOUTH: Moist mucosal membrane. Dentition intact. No abscess noted.  EAR, NOSE, THROAT: Clear without exudates. No external lesions.  NECK: Supple. No thyromegaly. No nodules. No JVD.  PULMONARY: CTAB CARDIOVASCULAR: S1 and S2. Regular rate and rhythm. No murmurs, rubs, or gallops. No edema. Pedal pulses 2+ bilaterally.  GASTROINTESTINAL: Soft, nontender, nondistended. No masses. Positive bowel sounds. No hepatosplenomegaly.  MUSCULOSKELETAL: No swelling, clubbing, or edema. Range of motion full in all extremities.  NEUROLOGIC: Cranial nerves II through XII are intact. No gross focal neurological deficits. Sensation intact. Reflexes intact.  SKIN: No ulceration, lesions, rashes, or cyanosis. Skin warm and dry. Turgor intact.  PSYCHIATRIC: Mood, affect within normal limits. The patient is awake, alert and oriented x 3. Insight, judgment intact.       IMAGING    CT ABDOMEN PELVIS W WO CONTRAST  Result Date: 05/03/2019 CLINICAL DATA:  Renal mass. EXAM: CT CHEST WITH CONTRAST CT ABDOMEN AND PELVIS WITH AND WITHOUT CONTRAST TECHNIQUE: Multidetector CT imaging of the chest was performed during intravenous contrast administration. Multidetector CT imaging of the abdomen and pelvis was performed following the standard protocol before and during bolus administration of intravenous contrast. CONTRAST:  175mL OMNIPAQUE IOHEXOL 300 MG/ML  SOLN COMPARISON:   05/02/2019 CT stone study. FINDINGS: CT CHEST FINDINGS Cardiovascular: The heart size is normal. No substantial pericardial effusion. Coronary artery calcification is evident. No thoracic aortic aneurysm. Mediastinum/Nodes: Necrotic lymph nodes are identified in the right mediastinum along the pleural surface and right paratracheal space. Nodal conglomeration to the right of the trachea measures 3.5 x 1.9 cm on 22/3. No left hilar lymphadenopathy. The esophagus has normal imaging features. There is no axillary lymphadenopathy. 15 mm short axis right retrocrural node is visible on 34/18. Lungs/Pleura: 1.7 cm perifissural nodule noted on the minor fissure (82/5). 8 mm posterior left lower lobe nodule seen on 78/5. 5 mm medial right upper lobe nodule visible on 57/5. Dependent atelectasis noted in both lower lobes. Tiny bilateral pleural effusions evident. Musculoskeletal: No worrisome lytic or sclerotic osseous abnormality. CT ABDOMEN AND PELVIS FINDINGS Hepatobiliary: No suspicious focal abnormality within the liver parenchyma. There is no evidence for gallstones, gallbladder wall thickening, or pericholecystic fluid. No intrahepatic or extrahepatic  biliary dilation. Pancreas: No focal mass lesion. No dilatation of the main duct. No intraparenchymal cyst. No peripancreatic edema. Spleen: No splenomegaly. No focal mass lesion. Adrenals/Urinary Tract: No adrenal nodule or mass. Right kidney unremarkable. 6.4 x 6.7 x 7.0 cm heterogeneously enhancing mass is identified in the lower pole left kidney. Lesion extends into the central sinus fat and displaces the renal pelvis and proximal ureter posteriorly. Left renal vein is patent. No evidence for hydroureter. The urinary bladder appears normal for the degree of distention. Stomach/Bowel: Stomach is unremarkable. No gastric wall thickening. No evidence of outlet obstruction. Duodenum is normally positioned as is the ligament of Treitz. No small bowel wall thickening. No  small bowel dilatation. The terminal ileum is normal. The appendix is normal. No gross colonic mass. No colonic wall thickening. Vascular/Lymphatic: No abdominal aortic aneurysm. 9 mm left para-aortic node on 66/18 is not enlarged by CT criteria but is suspicious for metastatic disease. No pelvic sidewall lymphadenopathy. Reproductive: The prostate gland and seminal vesicles are unremarkable. Other: No intraperitoneal free fluid. Musculoskeletal: No worrisome lytic or sclerotic osseous abnormality. 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 IMPRESSION: 1. 7 cm heterogeneous mass lesion in the lower pole left kidney is compatible with renal cell carcinoma. Mass generates mass-effect on the left renal pelvis and proximal left ureter. Left renal vein patent. 2. Necrotic lymphadenopathy in the mediastinum and right suprahilar region is highly suspicious for metastatic disease. Bilateral pulmonary nodules also raise concern for metastatic involvement. 3. Right retrocrural lymphadenopathy with upper normal left para-aortic lymph node also suggest metastatic involvement. 4. 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 Electronically Signed   By: Misty Stanley M.D.   On: 05/03/2019 13:35   CT CHEST W CONTRAST  Result Date: 05/03/2019 CLINICAL DATA:  Renal mass. EXAM: CT CHEST WITH CONTRAST CT ABDOMEN AND PELVIS WITH AND WITHOUT CONTRAST TECHNIQUE: Multidetector CT imaging of the chest was performed during intravenous contrast administration. Multidetector CT imaging of the abdomen and pelvis was performed following the standard protocol before and during bolus administration of intravenous contrast. CONTRAST:  166mL OMNIPAQUE IOHEXOL 300 MG/ML  SOLN COMPARISON:  05/02/2019 CT stone study. FINDINGS: CT CHEST FINDINGS Cardiovascular: The heart size is normal. No substantial pericardial effusion. Coronary artery calcification is evident. No thoracic aortic aneurysm.  Mediastinum/Nodes: Necrotic lymph nodes are identified in the right mediastinum along the pleural surface and right paratracheal space. Nodal conglomeration to the right of the trachea measures 3.5 x 1.9 cm on 22/3. No left hilar lymphadenopathy. The esophagus has normal imaging features. There is no axillary lymphadenopathy. 15 mm short axis right retrocrural node is visible on 34/18. Lungs/Pleura: 1.7 cm perifissural nodule noted on the minor fissure (82/5). 8 mm posterior left lower lobe nodule seen on 78/5. 5 mm medial right upper lobe nodule visible on 57/5. Dependent atelectasis noted in both lower lobes. Tiny bilateral pleural effusions evident. Musculoskeletal: No worrisome lytic or sclerotic osseous abnormality. CT ABDOMEN AND PELVIS FINDINGS Hepatobiliary: No suspicious focal abnormality within the liver parenchyma. There is no evidence for gallstones, gallbladder wall thickening, or pericholecystic fluid. No intrahepatic or extrahepatic biliary dilation. Pancreas: No focal mass lesion. No dilatation of the main duct. No intraparenchymal cyst. No peripancreatic edema. Spleen: No splenomegaly. No focal mass lesion. Adrenals/Urinary Tract: No adrenal nodule or mass. Right kidney unremarkable. 6.4 x 6.7 x 7.0 cm heterogeneously enhancing mass is identified in the lower pole left kidney. Lesion extends into the central sinus  fat and displaces the renal pelvis and proximal ureter posteriorly. Left renal vein is patent. No evidence for hydroureter. The urinary bladder appears normal for the degree of distention. Stomach/Bowel: Stomach is unremarkable. No gastric wall thickening. No evidence of outlet obstruction. Duodenum is normally positioned as is the ligament of Treitz. No small bowel wall thickening. No small bowel dilatation. The terminal ileum is normal. The appendix is normal. No gross colonic mass. No colonic wall thickening. Vascular/Lymphatic: No abdominal aortic aneurysm. 9 mm left para-aortic node  on 66/18 is not enlarged by CT criteria but is suspicious for metastatic disease. No pelvic sidewall lymphadenopathy. Reproductive: The prostate gland and seminal vesicles are unremarkable. Other: No intraperitoneal free fluid. Musculoskeletal: No worrisome lytic or sclerotic osseous abnormality. 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 IMPRESSION: 1. 7 cm heterogeneous mass lesion in the lower pole left kidney is compatible with renal cell carcinoma. Mass generates mass-effect on the left renal pelvis and proximal left ureter. Left renal vein patent. 2. Necrotic lymphadenopathy in the mediastinum and right suprahilar region is highly suspicious for metastatic disease. Bilateral pulmonary nodules also raise concern for metastatic involvement. 3. Right retrocrural lymphadenopathy with upper normal left para-aortic lymph node also suggest metastatic involvement. 4. 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 Electronically Signed   By: Misty Stanley M.D.   On: 05/03/2019 13:35   ECHOCARDIOGRAM COMPLETE  Result Date: 05/04/2019    ECHOCARDIOGRAM REPORT   Patient Name:   Mark Donaldson Date of Exam: 05/04/2019 Medical Rec #:  [id number]                Height:       65.0 in Accession #:    [id number]               Weight:       154.1 lb Date of Birth:  January 04, 1961               BSA:          1.771 m Patient Age:    18 years                 BP:           153/88 mmHg Patient Gender: M                        HR:           75 bpm. Exam Location:  [facility] Procedure: 2D Echo, Cardiac Doppler and Color Doppler Indications:     CHF- 428.0  History:         Patient has no prior history of Echocardiogram examinations.                  Risk Factors:Hypertension. Ischemic cardiomyopathy.  Sonographer:     Sherrie Sport RDCS (AE) Referring Phys:  Lerry Liner [facility] Diagnosing Phys: Kate Sable MD IMPRESSIONS  1. Left ventricular ejection fraction, by  estimation, is 60 to 65%. The left ventricle has normal function. The left ventricle has no regional wall motion abnormalities. There is mild left ventricular hypertrophy. Left ventricular diastolic parameters are consistent with Grade I diastolic dysfunction (impaired relaxation).  2. Right ventricular systolic function is normal. The right ventricular size is normal.  3. Left atrial size was mildly dilated.  4. The mitral valve is normal in structure. Trivial mitral valve regurgitation.  5. The aortic valve is tricuspid. Aortic  valve regurgitation is not visualized. Mild aortic valve sclerosis is present, with no evidence of aortic valve stenosis. FINDINGS  Left Ventricle: Left ventricular ejection fraction, by estimation, is 60 to 65%. The left ventricle has normal function. The left ventricle has no regional wall motion abnormalities. The left ventricular internal cavity size was normal in size. There is  mild left ventricular hypertrophy. Left ventricular diastolic parameters are consistent with Grade I diastolic dysfunction (impaired relaxation). Right Ventricle: The right ventricular size is normal. Right vetricular wall thickness was not assessed. Right ventricular systolic function is normal. Left Atrium: Left atrial size was mildly dilated. Right Atrium: Right atrial size was normal in size. Pericardium: There is no evidence of pericardial effusion. Mitral Valve: The mitral valve is normal in structure. Trivial mitral valve regurgitation. Tricuspid Valve: The tricuspid valve is normal in structure. Tricuspid valve regurgitation is not demonstrated. Aortic Valve: The aortic valve is tricuspid. Aortic valve regurgitation is not visualized. Mild aortic valve sclerosis is present, with no evidence of aortic valve stenosis. Aortic valve mean gradient measures 1.5 mmHg. Aortic valve peak gradient measures 3.4 mmHg. Aortic valve area, by VTI measures 3.33 cm. Pulmonic Valve: The pulmonic valve was normal in  structure. Pulmonic valve regurgitation is not visualized. Aorta: The aortic root is normal in size and structure. Venous: The inferior vena cava was not well visualized. IAS/Shunts: No atrial level shunt detected by color flow Doppler.  LEFT VENTRICLE PLAX 2D LVIDd:         4.68 cm  Diastology LVIDs:         2.65 cm  LV e' lateral:   8.27 cm/s LV PW:         1.35 cm  LV E/e' lateral: 7.9 LV IVS:        1.37 cm  LV e' medial:    6.42 cm/s LVOT diam:     2.00 cm  LV E/e' medial:  10.1 LV SV:         64 LV SV Index:   36 LVOT Area:     3.14 cm  RIGHT VENTRICLE RV Basal diam:  3.80 cm RV S prime:     10.20 cm/s TAPSE (M-mode): 3.2 cm LEFT ATRIUM             Index       RIGHT ATRIUM           Index LA diam:        4.40 cm 2.48 cm/m  RA Area:     13.40 cm LA Vol (A2C):   60.7 ml 34.28 ml/m RA Volume:   30.70 ml  17.34 ml/m LA Vol (A4C):   69.9 ml 39.48 ml/m LA Biplane Vol: 66.0 ml 37.27 ml/m  AORTIC VALVE                   PULMONIC VALVE AV Area (Vmax):    2.98 cm    PV Vmax:        0.79 m/s AV Area (Vmean):   3.30 cm    PV Peak grad:   2.5 mmHg AV Area (VTI):     3.33 cm    RVOT Peak grad: 2 mmHg AV Vmax:           91.80 cm/s AV Vmean:          54.900 cm/s AV VTI:            0.192 m AV Peak Grad:      3.4 mmHg  AV Mean Grad:      1.5 mmHg LVOT Vmax:         87.20 cm/s LVOT Vmean:        57.600 cm/s LVOT VTI:          0.203 m LVOT/AV VTI ratio: 1.06  AORTA Ao Root diam: 2.90 cm MITRAL VALVE MV Area (PHT): 2.62 cm    SHUNTS MV Decel Time: 290 msec    Systemic VTI:  0.20 m MV E velocity: 65.00 cm/s  Systemic Diam: 2.00 cm MV A velocity: 91.70 cm/s MV E/A ratio:  0.71 Kate Sable MD Electronically signed by Kate Sable MD Signature Date/Time: 05/04/2019/6:34:27 PM    Final    CT Renal Stone Study  Result Date: 05/02/2019 CLINICAL DATA:  Left flank pain, lower back pain EXAM: CT ABDOMEN AND PELVIS WITHOUT CONTRAST TECHNIQUE: Multidetector CT imaging of the abdomen and pelvis was performed following  the standard protocol without IV contrast. COMPARISON:  None. FINDINGS: Lower chest: Lung bases are clear. No effusions. Heart is normal size. Hepatobiliary: No focal hepatic abnormality. Gallbladder unremarkable. Pancreas: No focal abnormality or ductal dilatation. Spleen: No focal abnormality.  Normal size. Adrenals/Urinary Tract: Large solid mass is seen in the lower pole of the left kidney measuring 7.4 x 6.9 cm. Central calcifications noted. Findings concerning for renal cell carcinoma. There is mild left hydronephrosis which may be related to mass effect from the mass on the adjacent proximal left ureter. High-density material is seen within the left renal collecting system and ureter compatible with blood. No hydronephrosis on the right. Adrenal glands are unremarkable. Bladder wall appears diffusely thickened. Stomach/Bowel: Normal appendix. Stomach, large and small bowel grossly unremarkable. Vascular/Lymphatic: No evidence of aneurysm or adenopathy. Reproductive: Prostate enlargement. Other: 2 No free fluid or free air. Musculoskeletal: No acute bony abnormality. IMPRESSION: Large solid mass involving the lower pole of the left kidney concerning for renal cell carcinoma measuring up to 7.4 cm. Mild left hydronephrosis, possibly related to mass effect or blood clot within the left ureter. Diffusely thick walled bladder, likely related to prostate enlargement. Electronically Signed   By: Rolm Baptise M.D.   On: 05/02/2019 01:24      ASSESSMENT/PLAN   Pulmonary nodules with hilar/mediastinal lymphadenopathy    -plan for biopsy via electromagnetic navigational bronchoscopy as well as EBUS assisted LN biopsy Complications include: bleeding, cough, infection, pneumothorax, pneumomediastinum, possible need for chest tube and intubation with mechanical ventilation and rarely death.  Patient relates understanding above and all additional questions have been answered.     -benefits include - diagnosis of  metastatic cancer  -discussed procedure at length with patient and family multiple times over past 3 days they are not interested and decline any additional procedure.  -Additional recommendations per Palliative and oncology    Thank you for allowing me to participate in the care of this patient.   Patient/Family are satisfied with care plan and all questions have been answered.  This document was prepared using Dragon voice recognition software and may include unintentional dictation errors.     Ottie Glazier, M.D.  Division of [place]

## 2019-05-07 NOTE — Progress Notes (Signed)
Merdis Delay to be D/C'd Home per MD order.  Discussed prescriptions and follow up appointments with the patient. Prescriptions given to patient, medication list explained in detail. Pt verbalized understanding.  Allergies as of 05/07/2019   No Known Allergies     Medication List    STOP taking these medications   spironolactone 25 MG tablet Commonly known as: ALDACTONE     TAKE these medications   amLODipine 10 MG tablet Commonly known as: NORVASC Take 1 tablet (10 mg total) by mouth daily.   aspirin 81 MG chewable tablet Chew 1 tablet by mouth daily.   atorvastatin 80 MG tablet Commonly known as: LIPITOR Take 1 tablet by mouth at bedtime.   clopidogrel 75 MG tablet Commonly known as: PLAVIX Take 1 tablet by mouth daily.   furosemide 20 MG tablet Commonly known as: Lasix Take 1 tablet (20 mg total) by mouth daily. Start taking on: May 16, 2019 What changed:   medication strength  how much to take  These instructions start on May 16, 2019. If you are unsure what to do until then, ask your doctor or other care provider.   HYDROcodone-acetaminophen 5-325 MG tablet Commonly known as: NORCO/VICODIN Take 1 tablet by mouth every 6 (six) hours as needed for up to 7 days for moderate pain.   lisinopril 10 MG tablet Commonly known as: ZESTRIL Take 1 tablet by mouth daily.   metoprolol succinate 100 MG 24 hr tablet Commonly known as: TOPROL-XL Take 1.5 tablets by mouth daily.   senna-docusate 8.6-50 MG tablet Commonly known as: Senokot-S Take 1 tablet by mouth 2 (two) times daily between meals as needed for mild constipation.       Vitals:   05/07/19 0449 05/07/19 0952  BP: (!) 164/92 (!) 157/87  Pulse: 72 88  Resp: 20   Temp: 98.9 F (37.2 C)   SpO2: 97% 98%    Skin clean, dry and intact without evidence of skin break down, no evidence of skin tears noted. IV catheters discontinued intact. Sites without signs and symptoms of complications.  Dressing and pressure applied. Pt denies pain at this time. No complaints noted.  An After Visit Summary was printed and given to the patient. Patient escorted via [place], and D/C home via private auto.  Mark Donaldson 05/07/2019 2:28 PM

## 2019-05-07 NOTE — Discharge Summary (Signed)
Physician Discharge Summary  Mark Donaldson SWF:[id number] DOB: 03-28-1960 DOA: 05/02/2019  PCP: Frazier Richards, MD  Admit date: 05/02/2019 Discharge date: 05/07/2019  Admitted From: Home Disposition: Home  Recommendations for Outpatient Follow-up:  1. Follow ups as below. 2. Please obtain CBC/BMP/Mag at follow up 3. Please follow up on the following pending results: Left renal pathology  Home Health: None Equipment/Devices: None  Discharge Condition: Stable CODE STATUS: Full code  Follow-up Information    Frazier Richards, MD. Schedule an appointment as soon as possible for a visit in 1 week(s).   Specialty: Family Medicine Contact information: [place] [address] [phone]          Hospital Course: 59 year old male with history of CAD s/p PCI to LAD in 2018, ICM with improved EF per last echo, history of focal sclerosing/crescentic GN and CKD-3A presented to ED with 1 day of left flank pain and gross hematuria.  UA with RBC, WBC and bacteria.  CT renal study showed 7.4 cm left lower pole renal mass concerning for RCC, mild left hydronephrosis, bladder wall thickening with enlarged prostate.  Patient had sepsis physiology with tachycardia, tachypnea and lactic acidosis but no hypotension.  He was admitted for sepsis due to UTI, renal mass with diffuse lymphadenopathy and AKI.  He completed antibiotic course for sepsis/UTI.  He underwent radical nephrectomy on 05/05/2019. Per oncology, "patient was offered appointment to follow-up at [facility] cancer center outpatient to go over pathological report on management plan". In regards to his mediastinal and right suprahilar region LAD and bilateral lung nodules, patient was evaluated by pulmonology but he declined bronchoscopy/biopsy.  On the day of discharge, patient felt well with no complaints or concerns.  Cleared for discharge by urology.  Urology to arrange outpatient follow-up with urology and  oncology.  See individual problem list below for more hospital course.  Discharge Diagnoses:  Severe sepsis due to UTI with acute target organ damage. -Completed antibiotic course   Left renal mass suspicious of RCC and hydronephrosis of left kidney. -4/22-laparoscopic assisted left radical nephrectomy.  Surgical wound clean. -Norco 5/325 every 6 as needed moderate to severe pain, #20  Mediastinal and right suprahilar LAD and bilateral lung nodules concerning for mets from kidney -Evaluated by pulmonology both declined bronchoscopy/biopsy.  Chronic diastolic CHF/CAD s/p LAD stent: Stable.  No cardiopulmonary symptoms. -Resume home aspirin and Plavix with permission from neurology. -Reduce Lasix to 20 mg daily and to stopped Aldactone in the setting of AKI. -Continue low-dose lisinopril and metoprolol -Assess fluid status and adjust diuretics as appropriate  AKI/ATN: Creatinine peaked at 1.67 and trended down to 1.61. -Adjusted diuretics as above -Recheck BMP in 1 week  Uncontrolled hypertension: SBP elevated to 160s -Continue home metoprolol and lisinopril -Added amlodipine 10 mg daily -Recheck at follow-up and adjust cardiac meds as appropriate.  Gross hematuria: Resolved.  Likely due to renal mass/possible RCC.  H&H stable.   Language barrier affecting communication/care -Video interpreter with ID number [id number] utilized for this encounter.              Discharge Instructions  Discharge Instructions    Call MD for:  persistant dizziness or light-headedness   Complete by: As directed    Call MD for:  redness, tenderness, or signs of infection (pain, swelling, redness, odor or green/yellow discharge around incision site)   Complete by: As directed    Call MD for:  severe uncontrolled pain   Complete by: As directed  Call MD for:  temperature >100.4   Complete by: As directed    Diet - low sodium heart healthy   Complete by: As directed    Discharge  instructions   Complete by: As directed    It has been a pleasure taking care of you! You were hospitalized with left flank pain and blood in urine likely due to left kidney cancer.  You were treated surgically.  The urologist, oncologist and pulmonologist office will arrange outpatient follow-up.  He has some changes to your home medication during this hospitalization. Please review your new medication list and the directions before you take your medications.  Please avoid any over-the-counter pain medication other than plain Tylenol.   You may shower but avoid bath tub or soaking of surgical wound.  Please follow-up with your primary care doctor in 1 to 2 weeks to have your blood levels rechecked.   Take care,   Increase activity slowly   Complete by: As directed      Allergies as of 05/07/2019   No Known Allergies     Medication List    STOP taking these medications   spironolactone 25 MG tablet Commonly known as: ALDACTONE     TAKE these medications   amLODipine 10 MG tablet Commonly known as: NORVASC Take 1 tablet (10 mg total) by mouth daily.   aspirin 81 MG chewable tablet Chew 1 tablet by mouth daily.   atorvastatin 80 MG tablet Commonly known as: LIPITOR Take 1 tablet by mouth at bedtime.   clopidogrel 75 MG tablet Commonly known as: PLAVIX Take 1 tablet by mouth daily.   furosemide 20 MG tablet Commonly known as: Lasix Take 1 tablet (20 mg total) by mouth daily. Start taking on: May 16, 2019 What changed:   medication strength  how much to take  These instructions start on May 16, 2019. If you are unsure what to do until then, ask your doctor or other care provider.   HYDROcodone-acetaminophen 5-325 MG tablet Commonly known as: NORCO/VICODIN Take 1 tablet by mouth every 6 (six) hours as needed for up to 7 days for moderate pain.   lisinopril 10 MG tablet Commonly known as: ZESTRIL Take 1 tablet by mouth daily.   metoprolol succinate 100 MG 24 hr  tablet Commonly known as: TOPROL-XL Take 1.5 tablets by mouth daily.   senna-docusate 8.6-50 MG tablet Commonly known as: Senokot-S Take 1 tablet by mouth 2 (two) times daily between meals as needed for mild constipation.       Consultations:  Urology, nephrology, cardiology and pulmonology  Procedures/Studies:  4/22-laparoscopic assisted left radical nephrectomy.   CT ABDOMEN PELVIS W WO CONTRAST  Result Date: 05/03/2019 CLINICAL DATA:  Renal mass. EXAM: CT CHEST WITH CONTRAST CT ABDOMEN AND PELVIS WITH AND WITHOUT CONTRAST TECHNIQUE: Multidetector CT imaging of the chest was performed during intravenous contrast administration. Multidetector CT imaging of the abdomen and pelvis was performed following the standard protocol before and during bolus administration of intravenous contrast. CONTRAST:  172mL OMNIPAQUE IOHEXOL 300 MG/ML  SOLN COMPARISON:  05/02/2019 CT stone study. FINDINGS: CT CHEST FINDINGS Cardiovascular: The heart size is normal. No substantial pericardial effusion. Coronary artery calcification is evident. No thoracic aortic aneurysm. Mediastinum/Nodes: Necrotic lymph nodes are identified in the right mediastinum along the pleural surface and right paratracheal space. Nodal conglomeration to the right of the trachea measures 3.5 x 1.9 cm on 22/3. No left hilar lymphadenopathy. The esophagus has normal imaging features. There is no axillary  lymphadenopathy. 15 mm short axis right retrocrural node is visible on 34/18. Lungs/Pleura: 1.7 cm perifissural nodule noted on the minor fissure (82/5). 8 mm posterior left lower lobe nodule seen on 78/5. 5 mm medial right upper lobe nodule visible on 57/5. Dependent atelectasis noted in both lower lobes. Tiny bilateral pleural effusions evident. Musculoskeletal: No worrisome lytic or sclerotic osseous abnormality. CT ABDOMEN AND PELVIS FINDINGS Hepatobiliary: No suspicious focal abnormality within the liver parenchyma. There is no evidence  for gallstones, gallbladder wall thickening, or pericholecystic fluid. No intrahepatic or extrahepatic biliary dilation. Pancreas: No focal mass lesion. No dilatation of the main duct. No intraparenchymal cyst. No peripancreatic edema. Spleen: No splenomegaly. No focal mass lesion. Adrenals/Urinary Tract: No adrenal nodule or mass. Right kidney unremarkable. 6.4 x 6.7 x 7.0 cm heterogeneously enhancing mass is identified in the lower pole left kidney. Lesion extends into the central sinus fat and displaces the renal pelvis and proximal ureter posteriorly. Left renal vein is patent. No evidence for hydroureter. The urinary bladder appears normal for the degree of distention. Stomach/Bowel: Stomach is unremarkable. No gastric wall thickening. No evidence of outlet obstruction. Duodenum is normally positioned as is the ligament of Treitz. No small bowel wall thickening. No small bowel dilatation. The terminal ileum is normal. The appendix is normal. No gross colonic mass. No colonic wall thickening. Vascular/Lymphatic: No abdominal aortic aneurysm. 9 mm left para-aortic node on 66/18 is not enlarged by CT criteria but is suspicious for metastatic disease. No pelvic sidewall lymphadenopathy. Reproductive: The prostate gland and seminal vesicles are unremarkable. Other: No intraperitoneal free fluid. Musculoskeletal: No worrisome lytic or sclerotic osseous abnormality. 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 IMPRESSION: 1. 7 cm heterogeneous mass lesion in the lower pole left kidney is compatible with renal cell carcinoma. Mass generates mass-effect on the left renal pelvis and proximal left ureter. Left renal vein patent. 2. Necrotic lymphadenopathy in the mediastinum and right suprahilar region is highly suspicious for metastatic disease. Bilateral pulmonary nodules also raise concern for metastatic involvement. 3. Right retrocrural lymphadenopathy with upper normal left  para-aortic lymph node also suggest metastatic involvement. 4. 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 Electronically Signed   By: Misty Stanley M.D.   On: 05/03/2019 13:35   CT CHEST W CONTRAST  Result Date: 05/03/2019 CLINICAL DATA:  Renal mass. EXAM: CT CHEST WITH CONTRAST CT ABDOMEN AND PELVIS WITH AND WITHOUT CONTRAST TECHNIQUE: Multidetector CT imaging of the chest was performed during intravenous contrast administration. Multidetector CT imaging of the abdomen and pelvis was performed following the standard protocol before and during bolus administration of intravenous contrast. CONTRAST:  166mL OMNIPAQUE IOHEXOL 300 MG/ML  SOLN COMPARISON:  05/02/2019 CT stone study. FINDINGS: CT CHEST FINDINGS Cardiovascular: The heart size is normal. No substantial pericardial effusion. Coronary artery calcification is evident. No thoracic aortic aneurysm. Mediastinum/Nodes: Necrotic lymph nodes are identified in the right mediastinum along the pleural surface and right paratracheal space. Nodal conglomeration to the right of the trachea measures 3.5 x 1.9 cm on 22/3. No left hilar lymphadenopathy. The esophagus has normal imaging features. There is no axillary lymphadenopathy. 15 mm short axis right retrocrural node is visible on 34/18. Lungs/Pleura: 1.7 cm perifissural nodule noted on the minor fissure (82/5). 8 mm posterior left lower lobe nodule seen on 78/5. 5 mm medial right upper lobe nodule visible on 57/5. Dependent atelectasis noted in both lower lobes. Tiny bilateral pleural effusions evident. Musculoskeletal: No worrisome lytic  or sclerotic osseous abnormality. CT ABDOMEN AND PELVIS FINDINGS Hepatobiliary: No suspicious focal abnormality within the liver parenchyma. There is no evidence for gallstones, gallbladder wall thickening, or pericholecystic fluid. No intrahepatic or extrahepatic biliary dilation. Pancreas: No focal mass lesion. No dilatation of the main duct. No  intraparenchymal cyst. No peripancreatic edema. Spleen: No splenomegaly. No focal mass lesion. Adrenals/Urinary Tract: No adrenal nodule or mass. Right kidney unremarkable. 6.4 x 6.7 x 7.0 cm heterogeneously enhancing mass is identified in the lower pole left kidney. Lesion extends into the central sinus fat and displaces the renal pelvis and proximal ureter posteriorly. Left renal vein is patent. No evidence for hydroureter. The urinary bladder appears normal for the degree of distention. Stomach/Bowel: Stomach is unremarkable. No gastric wall thickening. No evidence of outlet obstruction. Duodenum is normally positioned as is the ligament of Treitz. No small bowel wall thickening. No small bowel dilatation. The terminal ileum is normal. The appendix is normal. No gross colonic mass. No colonic wall thickening. Vascular/Lymphatic: No abdominal aortic aneurysm. 9 mm left para-aortic node on 66/18 is not enlarged by CT criteria but is suspicious for metastatic disease. No pelvic sidewall lymphadenopathy. Reproductive: The prostate gland and seminal vesicles are unremarkable. Other: No intraperitoneal free fluid. Musculoskeletal: No worrisome lytic or sclerotic osseous abnormality. 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 IMPRESSION: 1. 7 cm heterogeneous mass lesion in the lower pole left kidney is compatible with renal cell carcinoma. Mass generates mass-effect on the left renal pelvis and proximal left ureter. Left renal vein patent. 2. Necrotic lymphadenopathy in the mediastinum and right suprahilar region is highly suspicious for metastatic disease. Bilateral pulmonary nodules also raise concern for metastatic involvement. 3. Right retrocrural lymphadenopathy with upper normal left para-aortic lymph node also suggest metastatic involvement. 4. 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 Electronically Signed   By: Misty Stanley M.D.   On: 05/03/2019  13:35   ECHOCARDIOGRAM COMPLETE  Result Date: 05/04/2019    ECHOCARDIOGRAM REPORT   Patient Name:   TUDOR CHANDLEY Date of Exam: 05/04/2019 Medical Rec #:  [id number]                Height:       65.0 in Accession #:    [id number]               Weight:       154.1 lb Date of Birth:  05/04/1960               BSA:          1.771 m Patient Age:    39 years                 BP:           153/88 mmHg Patient Gender: M                        HR:           75 bpm. Exam Location:  [facility] Procedure: 2D Echo, Cardiac Doppler and Color Doppler Indications:     CHF- 428.0  History:         Patient has no prior history of Echocardiogram examinations.                  Risk Factors:Hypertension. Ischemic cardiomyopathy.  Sonographer:     Sherrie Sport RDCS (AE) Referring Phys:  [id number] [facility] Diagnosing Phys:  Kate Sable MD IMPRESSIONS  1. Left ventricular ejection fraction, by estimation, is 60 to 65%. The left ventricle has normal function. The left ventricle has no regional wall motion abnormalities. There is mild left ventricular hypertrophy. Left ventricular diastolic parameters are consistent with Grade I diastolic dysfunction (impaired relaxation).  2. Right ventricular systolic function is normal. The right ventricular size is normal.  3. Left atrial size was mildly dilated.  4. The mitral valve is normal in structure. Trivial mitral valve regurgitation.  5. The aortic valve is tricuspid. Aortic valve regurgitation is not visualized. Mild aortic valve sclerosis is present, with no evidence of aortic valve stenosis. FINDINGS  Left Ventricle: Left ventricular ejection fraction, by estimation, is 60 to 65%. The left ventricle has normal function. The left ventricle has no regional wall motion abnormalities. The left ventricular internal cavity size was normal in size. There is  mild left ventricular hypertrophy. Left ventricular diastolic parameters are consistent with Grade I diastolic dysfunction  (impaired relaxation). Right Ventricle: The right ventricular size is normal. Right vetricular wall thickness was not assessed. Right ventricular systolic function is normal. Left Atrium: Left atrial size was mildly dilated. Right Atrium: Right atrial size was normal in size. Pericardium: There is no evidence of pericardial effusion. Mitral Valve: The mitral valve is normal in structure. Trivial mitral valve regurgitation. Tricuspid Valve: The tricuspid valve is normal in structure. Tricuspid valve regurgitation is not demonstrated. Aortic Valve: The aortic valve is tricuspid. Aortic valve regurgitation is not visualized. Mild aortic valve sclerosis is present, with no evidence of aortic valve stenosis. Aortic valve mean gradient measures 1.5 mmHg. Aortic valve peak gradient measures 3.4 mmHg. Aortic valve area, by VTI measures 3.33 cm. Pulmonic Valve: The pulmonic valve was normal in structure. Pulmonic valve regurgitation is not visualized. Aorta: The aortic root is normal in size and structure. Venous: The inferior vena cava was not well visualized. IAS/Shunts: No atrial level shunt detected by color flow Doppler.  LEFT VENTRICLE PLAX 2D LVIDd:         4.68 cm  Diastology LVIDs:         2.65 cm  LV e' lateral:   8.27 cm/s LV PW:         1.35 cm  LV E/e' lateral: 7.9 LV IVS:        1.37 cm  LV e' medial:    6.42 cm/s LVOT diam:     2.00 cm  LV E/e' medial:  10.1 LV SV:         64 LV SV Index:   36 LVOT Area:     3.14 cm  RIGHT VENTRICLE RV Basal diam:  3.80 cm RV S prime:     10.20 cm/s TAPSE (M-mode): 3.2 cm LEFT ATRIUM             Index       RIGHT ATRIUM           Index LA diam:        4.40 cm 2.48 cm/m  RA Area:     13.40 cm LA Vol (A2C):   60.7 ml 34.28 ml/m RA Volume:   30.70 ml  17.34 ml/m LA Vol (A4C):   69.9 ml 39.48 ml/m LA Biplane Vol: 66.0 ml 37.27 ml/m  AORTIC VALVE                   PULMONIC VALVE AV Area (Vmax):    2.98 cm    PV Vmax:  0.79 m/s AV Area (Vmean):   3.30 cm    PV Peak  grad:   2.5 mmHg AV Area (VTI):     3.33 cm    RVOT Peak grad: 2 mmHg AV Vmax:           91.80 cm/s AV Vmean:          54.900 cm/s AV VTI:            0.192 m AV Peak Grad:      3.4 mmHg AV Mean Grad:      1.5 mmHg LVOT Vmax:         87.20 cm/s LVOT Vmean:        57.600 cm/s LVOT VTI:          0.203 m LVOT/AV VTI ratio: 1.06  AORTA Ao Root diam: 2.90 cm MITRAL VALVE MV Area (PHT): 2.62 cm    SHUNTS MV Decel Time: 290 msec    Systemic VTI:  0.20 m MV E velocity: 65.00 cm/s  Systemic Diam: 2.00 cm MV A velocity: 91.70 cm/s MV E/A ratio:  0.71 Kate Sable MD Electronically signed by Kate Sable MD Signature Date/Time: 05/04/2019/6:34:27 PM    Final    CT Renal Stone Study  Result Date: 05/02/2019 CLINICAL DATA:  Left flank pain, lower back pain EXAM: CT ABDOMEN AND PELVIS WITHOUT CONTRAST TECHNIQUE: Multidetector CT imaging of the abdomen and pelvis was performed following the standard protocol without IV contrast. COMPARISON:  None. FINDINGS: Lower chest: Lung bases are clear. No effusions. Heart is normal size. Hepatobiliary: No focal hepatic abnormality. Gallbladder unremarkable. Pancreas: No focal abnormality or ductal dilatation. Spleen: No focal abnormality.  Normal size. Adrenals/Urinary Tract: Large solid mass is seen in the lower pole of the left kidney measuring 7.4 x 6.9 cm. Central calcifications noted. Findings concerning for renal cell carcinoma. There is mild left hydronephrosis which may be related to mass effect from the mass on the adjacent proximal left ureter. High-density material is seen within the left renal collecting system and ureter compatible with blood. No hydronephrosis on the right. Adrenal glands are unremarkable. Bladder wall appears diffusely thickened. Stomach/Bowel: Normal appendix. Stomach, large and small bowel grossly unremarkable. Vascular/Lymphatic: No evidence of aneurysm or adenopathy. Reproductive: Prostate enlargement. Other: 2 No free fluid or free air.  Musculoskeletal: No acute bony abnormality. IMPRESSION: Large solid mass involving the lower pole of the left kidney concerning for renal cell carcinoma measuring up to 7.4 cm. Mild left hydronephrosis, possibly related to mass effect or blood clot within the left ureter. Diffusely thick walled bladder, likely related to prostate enlargement. Electronically Signed   By: Rolm Baptise M.D.   On: 05/02/2019 01:24       Discharge Exam: Vitals:   05/07/19 0449 05/07/19 0952  BP: (!) 164/92 (!) 157/87  Pulse: 72 88  Resp: 20   Temp: 98.9 F (37.2 C)   SpO2: 97% 98%    GENERAL: No acute distress.  Appears well.  HEENT: MMM.  Vision and hearing grossly intact.  NECK: Supple.  No apparent JVD.  RESP:  No IWOB. Good air movement bilaterally. CVS:  RRR. Heart sounds normal.  ABD/GI/GU: Bowel sounds present. Soft.  Mild tenderness to palpation. MSK/EXT:  Moves extremities. No apparent deformity or edema.  SKIN: Laparoscopic surgical wound DCI.  No erythema or drainage. NEURO: Awake, alert and oriented appropriately.  No apparent focal neuro deficit. PSYCH: Calm. Normal affect.    The results of significant diagnostics from this  hospitalization (including imaging, microbiology, ancillary and laboratory) are listed below for reference.     Microbiology: Recent Results (from the past 240 hour(s))  Urine culture     Status: Abnormal   Collection Time: 05/01/19 11:44 PM   Specimen: In/Out Cath Urine  Result Value Ref Range Status   Specimen Description   Final    IN/OUT CATH URINE Performed at [facility], [address], [place], [place] [address]    Special Requests   Final    NONE Performed at [facility], [address] [place], [place] [address]    Culture MULTIPLE SPECIES PRESENT, SUGGEST RECOLLECTION (A)  Final   Report Status 05/03/2019 FINAL  Final  Blood culture (routine x 2)     Status: None   Collection Time: 05/02/19  2:33 AM   Specimen: BLOOD    Result Value Ref Range Status   Specimen Description BLOOD LEFT WRIST  Final   Special Requests   Final    BOTTLES DRAWN AEROBIC AND ANAEROBIC Blood Culture adequate volume   Culture   Final    NO GROWTH 5 DAYS Performed at [facility], [address], [place], [place] [address]    Report Status 05/07/2019 FINAL  Final  Blood culture (routine x 2)     Status: None   Collection Time: 05/02/19  2:34 AM   Specimen: BLOOD  Result Value Ref Range Status   Specimen Description BLOOD LEFT [facility]  Final   Special Requests   Final    BOTTLES DRAWN AEROBIC AND ANAEROBIC Blood Culture adequate volume   Culture   Final    NO GROWTH 5 DAYS Performed at [facility], [address], [place],  [address]    Report Status 05/07/2019 FINAL  Final  Respiratory Panel by RT PCR (Flu A&B, Covid) - Nasopharyngeal Swab     Status: None   Collection Time: 05/02/19  2:34 AM   Specimen: Nasopharyngeal Swab  Result Value Ref Range Status   SARS Coronavirus 2 by RT PCR NEGATIVE NEGATIVE Final    Comment: (NOTE) SARS-CoV-2 target nucleic acids are NOT DETECTED. The SARS-CoV-2 RNA is generally detectable in upper respiratoy specimens during the acute phase of infection. The lowest concentration of SARS-CoV-2 viral copies this assay can detect is 131 copies/mL. A negative result does not preclude SARS-Cov-2 infection and should not be used as the sole basis for treatment or other patient management decisions. A negative result may occur with  improper specimen collection/handling, submission of specimen other than nasopharyngeal swab, presence of viral mutation(s) within the areas targeted by this assay, and inadequate number of viral copies (<131 copies/mL). A negative result must be combined with clinical observations, patient history, and epidemiological information. The expected result is Negative. Fact Sheet for Patients:  PinkCheek.be Fact Sheet  for Healthcare Providers:  GravelBags.it This test is not yet ap proved or cleared by the [place] FDA and  has been authorized for detection and/or diagnosis of SARS-CoV-2 by FDA under an Emergency Use Authorization (EUA). This EUA will remain  in effect (meaning this test can be used) for the duration of the COVID-19 declaration under Section 564(b)(1) of the Act, 21 U.S.C. section 360bbb-3(b)(1), unless the authorization is terminated or revoked sooner.    Influenza A by PCR NEGATIVE NEGATIVE Final   Influenza B by PCR NEGATIVE NEGATIVE Final    Comment: (NOTE) The Xpert Xpress SARS-CoV-2/FLU/RSV assay is intended as an aid in  the diagnosis of  influenza from Nasopharyngeal swab specimens and  should not be used as a sole basis for treatment. Nasal washings and  aspirates are unacceptable for Xpert Xpress SARS-CoV-2/FLU/RSV  testing. Fact Sheet for Patients: PinkCheek.be Fact Sheet for Healthcare Providers: [employer] This test is not yet approved or cleared by the [place] FDA and  has been authorized for detection and/or diagnosis of SARS-CoV-2 by  FDA under an Emergency Use Authorization (EUA). This EUA will remain  in effect (meaning this test can be used) for the duration of the  Covid-19 declaration under Section 564(b)(1) of the Act, 21  U.S.C. section 360bbb-3(b)(1), unless the authorization is  terminated or revoked. Performed at [facility], [address] [place], [place] [address]      Labs: BNP (last 3 results) No results for input(s): BNP in the last 8760 hours. Basic Metabolic Panel: Recent Labs  Lab 05/01/19 2344 05/03/19 0531 05/05/19 0600 05/06/19 0449 05/07/19 0600  NA 130* 139 142 140 138  K 3.7 4.3 3.8 4.5 4.6  CL 96* 107 111 109 106  CO2 22 27 23 25 25   GLUCOSE 106* 99 99 100* 101*  BUN 17 15 11 16 17   CREATININE 1.29* 1.08 0.95  1.67* 1.61*  CALCIUM 8.5* 8.3* 8.4* 7.8* 8.2*   Liver Function Tests: Recent Labs  Lab 05/03/19 0531  AST 28  ALT 20  ALKPHOS 72  BILITOT 0.9  PROT 6.2*  ALBUMIN 3.0*   No results for input(s): LIPASE, AMYLASE in the last 168 hours. No results for input(s): AMMONIA in the last 168 hours. CBC: Recent Labs  Lab 05/01/19 2344 05/02/19 0629 05/03/19 0531 05/06/19 0449 05/07/19 0600  WBC 13.7* 11.1* 10.4 11.6*  --   HGB 14.6 14.8 14.5 12.1* 12.7*  HCT 43.2 41.9 43.1 37.2* 39.1  MCV 89.4 86.2 90.2 94.2  --   PLT 185 173 175 173  --    Cardiac Enzymes: No results for input(s): CKTOTAL, CKMB, CKMBINDEX, TROPONINI in the last 168 hours. BNP: Invalid input(s): POCBNP CBG: No results for input(s): GLUCAP in the last 168 hours. D-Dimer No results for input(s): DDIMER in the last 72 hours. Hgb A1c No results for input(s): HGBA1C in the last 72 hours. Lipid Profile No results for input(s): CHOL, HDL, LDLCALC, TRIG, CHOLHDL, LDLDIRECT in the last 72 hours. Thyroid function studies No results for input(s): TSH, T4TOTAL, T3FREE, THYROIDAB in the last 72 hours.  Invalid input(s): FREET3 Anemia work up No results for input(s): VITAMINB12, FOLATE, FERRITIN, TIBC, IRON, RETICCTPCT in the last 72 hours. Urinalysis    Component Value Date/Time   COLORURINE RED (A) 05/01/2019 2344   APPEARANCEUR CLOUDY (A) 05/01/2019 2344   LABSPEC 1.012 05/01/2019 2344   PHURINE 6.0 05/01/2019 2344   GLUCOSEU 50 (A) 05/01/2019 2344   HGBUR LARGE (A) 05/01/2019 2344   BILIRUBINUR NEGATIVE 05/01/2019 2344   [place] 05/01/2019 2344   PROTEINUR 100 (A) 05/01/2019 2344   NITRITE NEGATIVE 05/01/2019 2344   LEUKOCYTESUR NEGATIVE 05/01/2019 2344   Sepsis Labs Invalid input(s): PROCALCITONIN,  WBC,  LACTICIDVEN   Time coordinating discharge: 40 minutes  SIGNED:  Mercy Riding, MD  [facility] 05/07/2019, 10:22 AM  If 7PM-7AM, please contact  night-coverage [email or web address] Password [email or web address]

## 2019-05-16 ENCOUNTER — Other Ambulatory Visit: Payer: Self-pay | Admitting: Anatomic Pathology & Clinical Pathology

## 2019-05-16 ENCOUNTER — Telehealth: Payer: Self-pay | Admitting: Urology

## 2019-05-16 NOTE — Telephone Encounter (Signed)
I did a nephrectomy on this patient about 10 days ago.  It looks like he just arrived at the [facility] emergency room with some pain and nausea.   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    Wish him by best and let us know if we can do anything to help.    You will need Patent attorney.    Hollice Espy, MD

## 2019-05-17 ENCOUNTER — Telehealth: Payer: Self-pay

## 2019-05-17 DIAGNOSIS — N2889 Other specified disorders of kidney and ureter: Secondary | ICD-10-CM

## 2019-05-17 DIAGNOSIS — N179 Acute kidney failure, unspecified: Secondary | ICD-10-CM

## 2019-05-17 DIAGNOSIS — R911 Solitary pulmonary nodule: Secondary | ICD-10-CM

## 2019-05-17 NOTE — Telephone Encounter (Signed)
Done..  Pt has been scheduled as requested per Dr.Yu.

## 2019-05-17 NOTE — Telephone Encounter (Signed)
Patient was recently seen at [facility] ER. Per Dr. Tasia Catchings, hospitalist mentioned that patient may want to follow up with [facility]. Contacted patient to ask if he wanted to continue at [facility] or follow up locally with Dr. Tasia Catchings. Patient states that he was told at [facility] to follow up with [facility] since they started the workup for his cancer. Pt agrees to follow up with Dr. Tasia Catchings.   [facility] please schedule Lab/MD and I will call pt with app details.

## 2019-05-18 NOTE — Telephone Encounter (Signed)
Pt's son, Quillian Quince, notified of appts.   Ph #: [phone]

## 2019-05-19 ENCOUNTER — Other Ambulatory Visit: Payer: Self-pay

## 2019-05-19 LAB — SURGICAL PATHOLOGY

## 2019-05-19 NOTE — Progress Notes (Signed)
Tumor Board Documentation  Mark Donaldson was presented by Dr Tasia Catchings at our Tumor Board on 05/19/2019, which included representatives from medical oncology, radiation oncology, navigation, pathology, radiology, surgical, surgical oncology, internal medicine, pharmacy, palliative care, research.  Mark Donaldson currently presents as a new patient, for new positive pathology, for [place] with history of the following treatments: active survellience, surgical intervention(s).  Additionally, we reviewed previous medical and familial history, history of present illness, and recent lab results along with all available histopathologic and imaging studies. The tumor board considered available treatment options and made the following recommendations:   FU with Dr Tasia Catchings to discuss treatment options  The following procedures/referrals were also placed: No orders of the defined types were placed in this encounter.   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  Tumor board is a meeting of clinicians from various specialty areas who evaluate and discuss patients for whom a multidisciplinary approach is being considered. Final determinations in the plan of care are those of the provider(s). The responsibility for follow up of recommendations given during tumor board is that of the provider.   Today's extended care, comprehensive team conference, Mark Donaldson was not present for the discussion and was not examined.   Multidisciplinary Tumor Board is a multidisciplinary case peer review process.  Decisions discussed in the Multidisciplinary Tumor Board reflect the opinions of the specialists present at the conference without having examined the patient.  Ultimately, treatment and diagnostic decisions rest with the primary  provider(s) and the patient.

## 2019-05-20 NOTE — Telephone Encounter (Signed)
Unable to reach patient, used interpreter [phone] left VM to return call.

## 2019-05-23 ENCOUNTER — Encounter: Payer: Self-pay | Admitting: Oncology

## 2019-05-23 ENCOUNTER — Telehealth: Payer: Self-pay | Admitting: *Deleted

## 2019-05-23 ENCOUNTER — Inpatient Hospital Stay (HOSPITAL_BASED_OUTPATIENT_CLINIC_OR_DEPARTMENT_OTHER): Payer: Self-pay | Admitting: Oncology

## 2019-05-23 ENCOUNTER — Other Ambulatory Visit: Payer: Self-pay

## 2019-05-23 ENCOUNTER — Inpatient Hospital Stay: Payer: Self-pay | Attending: Oncology

## 2019-05-23 ENCOUNTER — Telehealth: Payer: Self-pay | Admitting: Pharmacy Technician

## 2019-05-23 ENCOUNTER — Telehealth: Payer: Self-pay | Admitting: Pharmacist

## 2019-05-23 VITALS — BP 109/66 | HR 75 | Temp 95.9°F | Resp 18 | Ht 65.95 in | Wt 151.5 lb

## 2019-05-23 DIAGNOSIS — Z7902 Long term (current) use of antithrombotics/antiplatelets: Secondary | ICD-10-CM | POA: Insufficient documentation

## 2019-05-23 DIAGNOSIS — Z5111 Encounter for antineoplastic chemotherapy: Secondary | ICD-10-CM | POA: Insufficient documentation

## 2019-05-23 DIAGNOSIS — C642 Malignant neoplasm of left kidney, except renal pelvis: Secondary | ICD-10-CM | POA: Insufficient documentation

## 2019-05-23 DIAGNOSIS — D631 Anemia in chronic kidney disease: Secondary | ICD-10-CM

## 2019-05-23 DIAGNOSIS — C7800 Secondary malignant neoplasm of unspecified lung: Secondary | ICD-10-CM | POA: Insufficient documentation

## 2019-05-23 DIAGNOSIS — Z7189 Other specified counseling: Secondary | ICD-10-CM

## 2019-05-23 DIAGNOSIS — I2510 Atherosclerotic heart disease of native coronary artery without angina pectoris: Secondary | ICD-10-CM | POA: Insufficient documentation

## 2019-05-23 DIAGNOSIS — C771 Secondary and unspecified malignant neoplasm of intrathoracic lymph nodes: Secondary | ICD-10-CM | POA: Insufficient documentation

## 2019-05-23 DIAGNOSIS — N1832 Chronic kidney disease, stage 3b: Secondary | ICD-10-CM | POA: Insufficient documentation

## 2019-05-23 DIAGNOSIS — Z905 Acquired absence of kidney: Secondary | ICD-10-CM | POA: Insufficient documentation

## 2019-05-23 DIAGNOSIS — N2889 Other specified disorders of kidney and ureter: Secondary | ICD-10-CM

## 2019-05-23 DIAGNOSIS — C649 Malignant neoplasm of unspecified kidney, except renal pelvis: Secondary | ICD-10-CM

## 2019-05-23 DIAGNOSIS — R911 Solitary pulmonary nodule: Secondary | ICD-10-CM

## 2019-05-23 DIAGNOSIS — I130 Hypertensive heart and chronic kidney disease with heart failure and stage 1 through stage 4 chronic kidney disease, or unspecified chronic kidney disease: Secondary | ICD-10-CM | POA: Insufficient documentation

## 2019-05-23 DIAGNOSIS — Z8679 Personal history of other diseases of the circulatory system: Secondary | ICD-10-CM

## 2019-05-23 DIAGNOSIS — I5042 Chronic combined systolic (congestive) and diastolic (congestive) heart failure: Secondary | ICD-10-CM | POA: Insufficient documentation

## 2019-05-23 HISTORY — DX: Malignant neoplasm of unspecified kidney, except renal pelvis: C64.9

## 2019-05-23 LAB — CBC WITH DIFFERENTIAL/PLATELET
Abs Immature Granulocytes: 0.06 10*3/uL (ref 0.00–0.07)
Basophils Absolute: 0.1 10*3/uL (ref 0.0–0.1)
Basophils Relative: 1 %
Eosinophils Absolute: 0.9 10*3/uL — ABNORMAL HIGH (ref 0.0–0.5)
Eosinophils Relative: 9 %
HCT: 41.5 % (ref 39.0–52.0)
Hemoglobin: 13.9 g/dL (ref 13.0–17.0)
Immature Granulocytes: 1 %
Lymphocytes Relative: 19 %
Lymphs Abs: 2.1 10*3/uL (ref 0.7–4.0)
MCH: 29.7 pg (ref 26.0–34.0)
MCHC: 33.5 g/dL (ref 30.0–36.0)
MCV: 88.7 fL (ref 80.0–100.0)
Monocytes Absolute: 1.2 10*3/uL — ABNORMAL HIGH (ref 0.1–1.0)
Monocytes Relative: 11 %
Neutro Abs: 6.7 10*3/uL (ref 1.7–7.7)
Neutrophils Relative %: 59 %
Platelets: 312 10*3/uL (ref 150–400)
RBC: 4.68 MIL/uL (ref 4.22–5.81)
RDW: 12.7 % (ref 11.5–15.5)
WBC: 11.1 10*3/uL — ABNORMAL HIGH (ref 4.0–10.5)
nRBC: 0 % (ref 0.0–0.2)

## 2019-05-23 LAB — COMPREHENSIVE METABOLIC PANEL
ALT: 26 U/L (ref 0–44)
AST: 28 U/L (ref 15–41)
Albumin: 3.8 g/dL (ref 3.5–5.0)
Alkaline Phosphatase: 95 U/L (ref 38–126)
Anion gap: 11 (ref 5–15)
BUN: 45 mg/dL — ABNORMAL HIGH (ref 6–20)
CO2: 26 mmol/L (ref 22–32)
Calcium: 9 mg/dL (ref 8.9–10.3)
Chloride: 95 mmol/L — ABNORMAL LOW (ref 98–111)
Creatinine, Ser: 1.93 mg/dL — ABNORMAL HIGH (ref 0.61–1.24)
GFR calc Af Amer: 44 mL/min — ABNORMAL LOW (ref >60)
GFR calc non Af Amer: 38 mL/min — ABNORMAL LOW (ref >60)
Glucose, Bld: 99 mg/dL (ref 70–99)
Potassium: 4.9 mmol/L (ref 3.5–5.1)
Sodium: 132 mmol/L — ABNORMAL LOW (ref 135–145)
Total Bilirubin: 0.5 mg/dL (ref 0.3–1.2)
Total Protein: 7.4 g/dL (ref 6.5–8.1)

## 2019-05-23 MED ORDER — AXITINIB 1 MG PO TABS: 5.0000 mg | ORAL_TABLET | Freq: Two times a day (BID) | ORAL | 0 refills | Status: DC

## 2019-05-23 NOTE — Telephone Encounter (Addendum)
   Interpreter [facility]#[id number]   Left VM to return call-2ND attempt   ----- Message from Hollice Espy, MD sent at 05/20/2019  9:11 AM EDT ----- Can you please make sure this guy has f/u with me in 3 weeks for post op check?  Hollice Espy, MD

## 2019-05-23 NOTE — Telephone Encounter (Signed)
Oral Oncology Pharmacist Encounter  Received new prescription for Inlyta (axitinib) for the treatment of newly diagnosed stage IV RCC in conjunction with pembrolizumab, planned duration until disease progression or unacceptable drug toxicity.  CMP from 05/23/19 assessed, no relevant lab abnormalities. Prescription dose and frequency assessed.   Current medication list in Epic reviewed, no DDIs with axitinib identified.  Patient is uninsured and will need to proceed with manufacturer assistance for the [place].  Oral Oncology Clinic will continue to follow for medication access, initial counseling and start date.  Darl Pikes, PharmD, BCPS, BCOP, CPP Hematology/Oncology Clinical Pharmacist Practitioner [facility]/HP/AP Oral [facility] [phone]  05/23/2019 12:07 PM

## 2019-05-23 NOTE — Progress Notes (Signed)
START ON PATHWAY REGIMEN - Renal Cell     A cycle is every 21 days:     Axitinib      Pembrolizumab   **Always confirm dose/schedule in your pharmacy ordering system**  Patient Characteristics: Stage IV/Metastatic Disease, Clear Cell, First Line, Intermediate or Poor Risk Therapeutic Status: Stage IV/Metastatic Disease Histology: Clear Cell Line of Therapy: First Line Risk Status: Intermediate Risk Intent of Therapy: Non-Curative / Palliative Intent, Discussed with Patient 

## 2019-05-23 NOTE — Telephone Encounter (Signed)
Oral Oncology Patient Advocate Encounter  Patient is currently uninsured, pending medicaid.  Patient will need to apply for assistance through [facility] and [place] to receive Inlyta.  I will contact interpreter services to speak with the patient to get information needed for the application.  [place] [facility] [place] Phone [phone] Fax [phone] 05/23/2019 12:35 PM

## 2019-05-23 NOTE — Progress Notes (Signed)
Hematology/Oncology Progress Note [facility] Telephone:([phone] [phone]  Patient Care Team: Frazier Richards, MD as PCP - General (Family Medicine)   Name of the patient: Mark Donaldson  [id number]  01/05/1961  Date of visit: 05/23/19   INTERVAL HISTORY-  59 y.o. male presents for follow-up of renal cell carcinoma treatments. Patient has past medical history CAD status post PCI to LAD in 2018, on aspirin and Plavix, CHF, history of focal sclerosing/crescentic GN-ANCA positive and CKD stage III. He was hospitalized from 05/02/2019-05/07/2019 due to gross hematuria. CT showed a 7.4 cm left lower pole renal mass concerning for RCC.  Patient also has necrotic lymphadenopathy in the mediastinum and the right supra hilar region which were highly suspicious for metastatic disease.  Bilateral lung nodules, also concerning for metastatic lung disease.  Right retrocrural lymphadenopathy with upper normal left periaortic lymph node. Patient was recommended for cytoreductive radical nephrectomy.  With history of ANCA positive vasculitis, also recommend thoracic lymphadenopathy biopsy to confirm distal metastasis.  Patient agreed to radical nephrectomy and declined bronchoscopy biopsy. He underwent left radical nephrectomy on 05/05/2019. Pathology showed pT3a pNx, RCC, conventional clear cell type, grade 3 Patient was discharged home. Today he presents to discuss pathology and the management plan. He was accompanied by his wife and daughter. Patient reports that he uses hydrocodone for pain.  Pain around the surgical site has improved.  He occasionally has tingling sensation around the surgical site.  Denies any fever or chills, drainage from the surgical site.  Patient is Spanish-speaking.  Spanish interpreter present for the entire encounter for translation  Review of systems- Review of Systems  Constitutional: Negative for appetite change, chills,  fatigue, fever and unexpected weight change.  HENT:   Negative for hearing loss and voice change.   Eyes: Negative for eye problems and icterus.  Respiratory: Negative for chest tightness, cough and shortness of breath.   Cardiovascular: Negative for chest pain and leg swelling.  Gastrointestinal: Negative for abdominal distention and abdominal pain.  Endocrine: Negative for hot flashes.  Genitourinary: Negative for difficulty urinating, dysuria and frequency.   Musculoskeletal: Negative for arthralgias.  Skin: Negative for itching and rash.  Neurological: Negative for light-headedness and numbness.  Hematological: Negative for adenopathy. Does not bruise/bleed easily.  Psychiatric/Behavioral: Negative for confusion.    No Known Allergies  Patient Active Problem List   Diagnosis Date Noted  . Goals of care, counseling/discussion 05/23/2019  . Renal cell carcinoma of left kidney ([place]) 05/23/2019  . Palliative care encounter   . History of vasculitis   . Thoracic lymphadenopathy   . Lung nodule   . CAD S/P percutaneous coronary angioplasty 05/02/2019  . Kidney mass 05/02/2019  . Hydronephrosis of left kidney 05/02/2019  . UTI (urinary tract infection) 05/02/2019  . AKI (acute kidney injury) ([place]) 05/02/2019  . Gross hematuria 05/02/2019  . Chronic diastolic CHF (congestive heart failure) ([place]) 05/02/2019  . Sepsis ([place]) 05/02/2019  . Severe sepsis ([place]) 05/02/2019  . Preop cardiovascular exam      Past Medical History:  Diagnosis Date  . 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 CAD (coronary artery disease)   . Essential hypertension   . HFrEF (heart failure with reduced ejection fraction) ([place])   . Hyperlipidemia LDL goal <70   . Ischemic cardiomyopathy   . Renal disorder      Past Surgical History:  Procedure Laterality Date  . CARDIAC SURGERY     cardiac cath with 2 stent placement  .  LAPAROSCOPIC NEPHRECTOMY, HAND ASSISTED Left 05/05/2019   Procedure: HAND ASSISTED  LAPAROSCOPIC NEPHRECTOMY;  Surgeon: Hollice Espy, MD;  Location: [facility] ORS;  Service: Urology;  Laterality: Left;    Social History   Socioeconomic History  . Marital status: Married    Spouse name: Not on file  . Number of children: Not on file  . Years of education: Not on file  . Highest education level: Not on file  Occupational History  . Not on file  Tobacco Use  . Smoking status: Never Smoker  . Smokeless tobacco: Never Used  Substance and Sexual Activity  . Alcohol use: Yes  . Drug use: Not on file  . Sexual activity: Not on file  Other Topics Concern  . Not on file  Social History Narrative  . Not on file   Social Determinants of Health   Financial Resource Strain:   . Difficulty of Paying Living Expenses:   Food Insecurity:   . Worried About Charity fundraiser in the Last Year:   . Arboriculturist in the Last Year:   Transportation Needs:   . Film/video editor (Medical):   Marland Kitchen Lack of Transportation (Non-Medical):   Physical Activity:   . Days of Exercise per Week:   . Minutes of Exercise per Session:   Stress:   . Feeling of Stress :   Social Connections:   . Frequency of Communication with Friends and Family:   . Frequency of Social Gatherings with Friends and Family:   . Attends Religious Services:   . Active Member of Clubs or Organizations:   . Attends Archivist Meetings:   Marland Kitchen Marital Status:   Intimate Partner Violence:   . Fear of Current or Ex-Partner:   . Emotionally Abused:   Marland Kitchen Physically Abused:   . Sexually Abused:      Family History  Problem Relation Age of Onset  . Cancer Mother   . Blindness Brother      Current Outpatient Medications:  .  amLODipine (NORVASC) 10 MG tablet, Take 1 tablet (10 mg total) by mouth daily., Disp: 90 tablet, Rfl: 1 .  atorvastatin (LIPITOR) 80 MG tablet, Take 1 tablet by mouth at bedtime., Disp: , Rfl:  .  clopidogrel (PLAVIX) 75 MG tablet, Take 1 tablet by mouth daily., Disp: , Rfl:    .  furosemide (LASIX) 20 MG tablet, Take 1 tablet (20 mg total) by mouth daily., Disp: 30 tablet, Rfl: 1 .  furosemide (LASIX) 40 MG tablet, Take 40 mg by mouth., Disp: , Rfl:  .  HYDROcodone-acetaminophen (NORCO/VICODIN) 5-325 MG tablet, Take 1 tablet by mouth every 6 (six) hours as needed for moderate pain., Disp: , Rfl:  .  lisinopril (ZESTRIL) 10 MG tablet, Take 1 tablet by mouth daily., Disp: , Rfl:  .  metoprolol succinate (TOPROL-XL) 100 MG 24 hr tablet, Take 1.5 tablets by mouth daily., Disp: , Rfl:  .  senna-docusate (SENOKOT-S) 8.6-50 MG tablet, Take 1 tablet by mouth 2 (two) times daily between meals as needed for mild constipation., Disp: 20 tablet, Rfl: 0 .  aspirin 81 MG chewable tablet, Chew 1 tablet by mouth daily., Disp: , Rfl:    Physical exam: There were no vitals filed for this visit. Physical Exam  Constitutional: He is oriented to person, place, and time. No distress.  HENT:  Head: Normocephalic and atraumatic.  Mouth/Throat: No oropharyngeal exudate.  Eyes: Pupils are equal, round, and reactive to light. EOM are normal.  No scleral icterus.  Cardiovascular: Normal rate and regular rhythm.  No murmur heard. Pulmonary/Chest: Effort normal. No respiratory distress. He has no rales. He exhibits no tenderness.  Abdominal: Soft. He exhibits no distension. There is no abdominal tenderness.  Musculoskeletal:        General: No edema. Normal range of motion.     Cervical back: Normal range of motion and neck supple.  Neurological: He is alert and oriented to person, place, and time.  Skin: Skin is warm and dry. He is not diaphoretic. No erythema.  Left radical nephrectomy surgical site heals well.  No erythema or drainage.  Psychiatric: Affect normal.       CMP Latest Ref Rng & Units 05/23/2019  Glucose 70 - 99 mg/dL 99  BUN 6 - 20 mg/dL 45(H)  Creatinine 0.61 - 1.24 mg/dL 1.93(H)  Sodium 135 - 145 mmol/L 132(L)  Potassium 3.5 - 5.1 mmol/L 4.9  Chloride 98 - 111  mmol/L 95(L)  CO2 22 - 32 mmol/L 26  Calcium 8.9 - 10.3 mg/dL 9.0  Total Protein 6.5 - 8.1 g/dL 7.4  Total Bilirubin 0.3 - 1.2 mg/dL 0.5  Alkaline Phos 38 - 126 U/L 95  AST 15 - 41 U/L 28  ALT 0 - 44 U/L 26   CBC Latest Ref Rng & Units 05/23/2019  WBC 4.0 - 10.5 K/uL 11.1(H)  Hemoglobin 13.0 - 17.0 g/dL 13.9  Hematocrit 39.0 - 52.0 % 41.5  Platelets 150 - 400 K/uL 312    RADIOGRAPHIC STUDIES: I have personally reviewed the radiological images as listed and agreed with the findings in the report. CT ABDOMEN PELVIS W WO CONTRAST  Result Date: 05/03/2019 CLINICAL DATA:  Renal mass. EXAM: CT CHEST WITH CONTRAST CT ABDOMEN AND PELVIS WITH AND WITHOUT CONTRAST TECHNIQUE: Multidetector CT imaging of the chest was performed during intravenous contrast administration. Multidetector CT imaging of the abdomen and pelvis was performed following the standard protocol before and during bolus administration of intravenous contrast. CONTRAST:  175mL OMNIPAQUE IOHEXOL 300 MG/ML  SOLN COMPARISON:  05/02/2019 CT stone study. FINDINGS: CT CHEST FINDINGS Cardiovascular: The heart size is normal. No substantial pericardial effusion. Coronary artery calcification is evident. No thoracic aortic aneurysm. Mediastinum/Nodes: Necrotic lymph nodes are identified in the right mediastinum along the pleural surface and right paratracheal space. Nodal conglomeration to the right of the trachea measures 3.5 x 1.9 cm on 22/3. No left hilar lymphadenopathy. The esophagus has normal imaging features. There is no axillary lymphadenopathy. 15 mm short axis right retrocrural node is visible on 34/18. Lungs/Pleura: 1.7 cm perifissural nodule noted on the minor fissure (82/5). 8 mm posterior left lower lobe nodule seen on 78/5. 5 mm medial right upper lobe nodule visible on 57/5. Dependent atelectasis noted in both lower lobes. Tiny bilateral pleural effusions evident. Musculoskeletal: No worrisome lytic or sclerotic osseous abnormality.  CT ABDOMEN AND PELVIS FINDINGS Hepatobiliary: No suspicious focal abnormality within the liver parenchyma. There is no evidence for gallstones, gallbladder wall thickening, or pericholecystic fluid. No intrahepatic or extrahepatic biliary dilation. Pancreas: No focal mass lesion. No dilatation of the main duct. No intraparenchymal cyst. No peripancreatic edema. Spleen: No splenomegaly. No focal mass lesion. Adrenals/Urinary Tract: No adrenal nodule or mass. Right kidney unremarkable. 6.4 x 6.7 x 7.0 cm heterogeneously enhancing mass is identified in the lower pole left kidney. Lesion extends into the central sinus fat and displaces the renal pelvis and proximal ureter posteriorly. Left renal vein is patent. No evidence for hydroureter. The urinary  bladder appears normal for the degree of distention. Stomach/Bowel: Stomach is unremarkable. No gastric wall thickening. No evidence of outlet obstruction. Duodenum is normally positioned as is the ligament of Treitz. No small bowel wall thickening. No small bowel dilatation. The terminal ileum is normal. The appendix is normal. No gross colonic mass. No colonic wall thickening. Vascular/Lymphatic: No abdominal aortic aneurysm. 9 mm left para-aortic node on 66/18 is not enlarged by CT criteria but is suspicious for metastatic disease. No pelvic sidewall lymphadenopathy. Reproductive: The prostate gland and seminal vesicles are unremarkable. Other: No intraperitoneal free fluid. Musculoskeletal: No worrisome lytic or sclerotic osseous abnormality. 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 IMPRESSION: 1. 7 cm heterogeneous mass lesion in the lower pole left kidney is compatible with renal cell carcinoma. Mass generates mass-effect on the left renal pelvis and proximal left ureter. Left renal vein patent. 2. Necrotic lymphadenopathy in the mediastinum and right suprahilar region is highly suspicious for metastatic disease. Bilateral  pulmonary nodules also raise concern for metastatic involvement. 3. Right retrocrural lymphadenopathy with upper normal left para-aortic lymph node also suggest metastatic involvement. 4. 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 Electronically Signed   By: Misty Stanley M.D.   On: 05/03/2019 13:35   CT CHEST W CONTRAST  Result Date: 05/03/2019 CLINICAL DATA:  Renal mass. EXAM: CT CHEST WITH CONTRAST CT ABDOMEN AND PELVIS WITH AND WITHOUT CONTRAST TECHNIQUE: Multidetector CT imaging of the chest was performed during intravenous contrast administration. Multidetector CT imaging of the abdomen and pelvis was performed following the standard protocol before and during bolus administration of intravenous contrast. CONTRAST:  146mL OMNIPAQUE IOHEXOL 300 MG/ML  SOLN COMPARISON:  05/02/2019 CT stone study. FINDINGS: CT CHEST FINDINGS Cardiovascular: The heart size is normal. No substantial pericardial effusion. Coronary artery calcification is evident. No thoracic aortic aneurysm. Mediastinum/Nodes: Necrotic lymph nodes are identified in the right mediastinum along the pleural surface and right paratracheal space. Nodal conglomeration to the right of the trachea measures 3.5 x 1.9 cm on 22/3. No left hilar lymphadenopathy. The esophagus has normal imaging features. There is no axillary lymphadenopathy. 15 mm short axis right retrocrural node is visible on 34/18. Lungs/Pleura: 1.7 cm perifissural nodule noted on the minor fissure (82/5). 8 mm posterior left lower lobe nodule seen on 78/5. 5 mm medial right upper lobe nodule visible on 57/5. Dependent atelectasis noted in both lower lobes. Tiny bilateral pleural effusions evident. Musculoskeletal: No worrisome lytic or sclerotic osseous abnormality. CT ABDOMEN AND PELVIS FINDINGS Hepatobiliary: No suspicious focal abnormality within the liver parenchyma. There is no evidence for gallstones, gallbladder wall thickening, or pericholecystic fluid. No  intrahepatic or extrahepatic biliary dilation. Pancreas: No focal mass lesion. No dilatation of the main duct. No intraparenchymal cyst. No peripancreatic edema. Spleen: No splenomegaly. No focal mass lesion. Adrenals/Urinary Tract: No adrenal nodule or mass. Right kidney unremarkable. 6.4 x 6.7 x 7.0 cm heterogeneously enhancing mass is identified in the lower pole left kidney. Lesion extends into the central sinus fat and displaces the renal pelvis and proximal ureter posteriorly. Left renal vein is patent. No evidence for hydroureter. The urinary bladder appears normal for the degree of distention. Stomach/Bowel: Stomach is unremarkable. No gastric wall thickening. No evidence of outlet obstruction. Duodenum is normally positioned as is the ligament of Treitz. No small bowel wall thickening. No small bowel dilatation. The terminal ileum is normal. The appendix is normal. No gross colonic mass. No colonic wall thickening. Vascular/Lymphatic: No  abdominal aortic aneurysm. 9 mm left para-aortic node on 66/18 is not enlarged by CT criteria but is suspicious for metastatic disease. No pelvic sidewall lymphadenopathy. Reproductive: The prostate gland and seminal vesicles are unremarkable. Other: No intraperitoneal free fluid. Musculoskeletal: No worrisome lytic or sclerotic osseous abnormality. 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 IMPRESSION: 1. 7 cm heterogeneous mass lesion in the lower pole left kidney is compatible with renal cell carcinoma. Mass generates mass-effect on the left renal pelvis and proximal left ureter. Left renal vein patent. 2. Necrotic lymphadenopathy in the mediastinum and right suprahilar region is highly suspicious for metastatic disease. Bilateral pulmonary nodules also raise concern for metastatic involvement. 3. Right retrocrural lymphadenopathy with upper normal left para-aortic lymph node also suggest metastatic involvement. 4. Avascular  necrosis noted both femoral heads without collapse. Electronically Signed   By: Misty Stanley M.D.   On: 05/03/2019 13:35   ECHOCARDIOGRAM COMPLETE  Result Date: 05/04/2019    ECHOCARDIOGRAM REPORT   Patient Name:   ATHANASIUS KESLING Date of Exam: 05/04/2019 Medical Rec #:  [id number]                Height:       65.0 in Accession #:    [id number]               Weight:       154.1 lb Date of Birth:  10-10-1960               BSA:          1.771 m Patient Age:    31 years                 BP:           153/88 mmHg Patient Gender: M                        HR:           75 bpm. Exam Location:  [facility] Procedure: 2D Echo, Cardiac Doppler and Color Doppler Indications:     CHF- 428.0  History:         Patient has no prior history of Echocardiogram examinations.                  Risk Factors:Hypertension. Ischemic cardiomyopathy.  Sonographer:     Sherrie Sport RDCS (AE) Referring Phys:  Lerry Liner [facility] Diagnosing Phys: Kate Sable MD IMPRESSIONS  1. Left ventricular ejection fraction, by estimation, is 60 to 65%. The left ventricle has normal function. The left ventricle has no regional wall motion abnormalities. There is mild left ventricular hypertrophy. Left ventricular diastolic parameters are consistent with Grade I diastolic dysfunction (impaired relaxation).  2. Right ventricular systolic function is normal. The right ventricular size is normal.  3. Left atrial size was mildly dilated.  4. The mitral valve is normal in structure. Trivial mitral valve regurgitation.  5. The aortic valve is tricuspid. Aortic valve regurgitation is not visualized. Mild aortic valve sclerosis is present, with no evidence of aortic valve stenosis. FINDINGS  Left Ventricle: Left ventricular ejection fraction, by estimation, is 60 to 65%. The left ventricle has normal function. The left ventricle has no regional wall motion abnormalities. The left ventricular internal cavity size was normal in size. There is  mild left  ventricular hypertrophy. Left ventricular diastolic parameters are consistent with Grade I diastolic dysfunction (impaired relaxation). Right Ventricle: The  right ventricular size is normal. Right vetricular wall thickness was not assessed. Right ventricular systolic function is normal. Left Atrium: Left atrial size was mildly dilated. Right Atrium: Right atrial size was normal in size. Pericardium: There is no evidence of pericardial effusion. Mitral Valve: The mitral valve is normal in structure. Trivial mitral valve regurgitation. Tricuspid Valve: The tricuspid valve is normal in structure. Tricuspid valve regurgitation is not demonstrated. Aortic Valve: The aortic valve is tricuspid. Aortic valve regurgitation is not visualized. Mild aortic valve sclerosis is present, with no evidence of aortic valve stenosis. Aortic valve mean gradient measures 1.5 mmHg. Aortic valve peak gradient measures 3.4 mmHg. Aortic valve area, by VTI measures 3.33 cm. Pulmonic Valve: The pulmonic valve was normal in structure. Pulmonic valve regurgitation is not visualized. Aorta: The aortic root is normal in size and structure. Venous: The inferior vena cava was not well visualized. IAS/Shunts: No atrial level shunt detected by color flow Doppler.  LEFT VENTRICLE PLAX 2D LVIDd:         4.68 cm  Diastology LVIDs:         2.65 cm  LV e' lateral:   8.27 cm/s LV PW:         1.35 cm  LV E/e' lateral: 7.9 LV IVS:        1.37 cm  LV e' medial:    6.42 cm/s LVOT diam:     2.00 cm  LV E/e' medial:  10.1 LV SV:         64 LV SV Index:   36 LVOT Area:     3.14 cm  RIGHT VENTRICLE RV Basal diam:  3.80 cm RV S prime:     10.20 cm/s TAPSE (M-mode): 3.2 cm LEFT ATRIUM             Index       RIGHT ATRIUM           Index LA diam:        4.40 cm 2.48 cm/m  RA Area:     13.40 cm LA Vol (A2C):   60.7 ml 34.28 ml/m RA Volume:   30.70 ml  17.34 ml/m LA Vol (A4C):   69.9 ml 39.48 ml/m LA Biplane Vol: 66.0 ml 37.27 ml/m  AORTIC VALVE                    PULMONIC VALVE AV Area (Vmax):    2.98 cm    PV Vmax:        0.79 m/s AV Area (Vmean):   3.30 cm    PV Peak grad:   2.5 mmHg AV Area (VTI):     3.33 cm    RVOT Peak grad: 2 mmHg AV Vmax:           91.80 cm/s AV Vmean:          54.900 cm/s AV VTI:            0.192 m AV Peak Grad:      3.4 mmHg AV Mean Grad:      1.5 mmHg LVOT Vmax:         87.20 cm/s LVOT Vmean:        57.600 cm/s LVOT VTI:          0.203 m LVOT/AV VTI ratio: 1.06  AORTA Ao Root diam: 2.90 cm MITRAL VALVE MV Area (PHT): 2.62 cm    SHUNTS MV Decel Time: 290 msec    Systemic VTI:  0.20  m MV E velocity: 65.00 cm/s  Systemic Diam: 2.00 cm MV A velocity: 91.70 cm/s MV E/A ratio:  0.71 Kate Sable MD Electronically signed by Kate Sable MD Signature Date/Time: 05/04/2019/6:34:27 PM    Final    CT Renal Stone Study  Result Date: 05/02/2019 CLINICAL DATA:  Left flank pain, lower back pain EXAM: CT ABDOMEN AND PELVIS WITHOUT CONTRAST TECHNIQUE: Multidetector CT imaging of the abdomen and pelvis was performed following the standard protocol without IV contrast. COMPARISON:  None. FINDINGS: Lower chest: Lung bases are clear. No effusions. Heart is normal size. Hepatobiliary: No focal hepatic abnormality. Gallbladder unremarkable. Pancreas: No focal abnormality or ductal dilatation. Spleen: No focal abnormality.  Normal size. Adrenals/Urinary Tract: Large solid mass is seen in the lower pole of the left kidney measuring 7.4 x 6.9 cm. Central calcifications noted. Findings concerning for renal cell carcinoma. There is mild left hydronephrosis which may be related to mass effect from the mass on the adjacent proximal left ureter. High-density material is seen within the left renal collecting system and ureter compatible with blood. No hydronephrosis on the right. Adrenal glands are unremarkable. Bladder wall appears diffusely thickened. Stomach/Bowel: Normal appendix. Stomach, large and small bowel grossly unremarkable. Vascular/Lymphatic: No  evidence of aneurysm or adenopathy. Reproductive: Prostate enlargement. Other: 2 No free fluid or free air. Musculoskeletal: No acute bony abnormality. IMPRESSION: Large solid mass involving the lower pole of the left kidney concerning for renal cell carcinoma measuring up to 7.4 cm. Mild left hydronephrosis, possibly related to mass effect or blood clot within the left ureter. Diffusely thick walled bladder, likely related to prostate enlargement. Electronically Signed   By: Rolm Baptise M.D.   On: 05/02/2019 01:24    Assessment and plan-  Patient is a 59 y.o. male history of CAD, status post stent, CHF, history of ANCA positive sclerosing/crescentic GN, chronic kidney disease stage III presents for treatments of stage IV renal cell carcinoma 1. Renal cell carcinoma of left kidney (HCC)   2. Goals of care, counseling/discussion   3. Anemia in stage 3b chronic kidney disease   4. History of vasculitis    Stage IV renal cell carcinoma Images were independent reviewed and discussed with patient and his family members. Pathology was reviewed and discussed. Diagnosis of renal cell carcinoma was discussed.  Clinically he has stage IV RCC with lung and thoracic lymphadenopathy metastasis. MSKCC risk at least intermediate risk  We discussed about the small likelihood of thoracic lymphadenopathy being secondary to ANCA vasculitis.  He declined biopsy. The diagnosis and care plan were discussed with patient in detail.  NCCN guidelines were reviewed and shared with patient.   The goal of treatment which is to palliate disease, disease related symptoms, improve quality of life and hopefully prolong life was highlighted in our discussion.  Patient understands that his condition is not curable.  Chemotherapy education was provided.  We had discussed the composition of chemotherapy regimen, length of chemo cycle, duration of treatment and the time to assess response to treatment.    I explained to the patient  the risks and benefits of chemotherapy axitinib along with Keytruda.  Side effects of axitinib including all but not limited to hypertension, heart failure, bleeding, liver toxicities, hair loss, diarrhea, low blood counts, worsening kidney function, blood clots, risk of life threatening infection and even death, etc.  I discussed the mechanism of action and rationale of using immunotherapy Keytruda.  Discussed the potential side effects of immunotherapy including but not limited  to diarrhea; skin rash; respiratory failure, kidney failure, mental status change, worsening of autoimmune conditions, elevated LFTs/liver failure,endocrine abnormalities, acute deterioration  and even death,etc.  Patient voices understanding and willing to proceed treatments.  # Chemotherapy education;  Supportive care measures are necessary for patient well-being and will be provided as necessary. We spent sufficient time to discuss many aspect of care, questions were answered to patient's satisfaction.  Thank you for allowing me to participate in the care of this patient.   Earlie Server, MD, PhD Hematology Oncology [facility] at [facility] Pager- [phone] 05/23/2019

## 2019-05-24 ENCOUNTER — Telehealth: Payer: Self-pay

## 2019-05-24 DIAGNOSIS — C642 Malignant neoplasm of left kidney, except renal pelvis: Secondary | ICD-10-CM

## 2019-05-24 NOTE — Progress Notes (Signed)
Pharmacist Chemotherapy Monitoring - Initial Assessment    Anticipated start date: 05/31/19    Regimen:  . Are orders appropriate based on the patient's diagnosis, regimen, and cycle? Yes . Does the plan date match the patient's scheduled date? Yes . Is the sequencing of drugs appropriate? Yes . Are the premedications appropriate for the patient's regimen? Yes . Prior Authorization for treatment is: Approved o If applicable, is the correct biosimilar selected based on the patient's insurance? not applicable  Organ Function and Labs: Marland Kitchen Are dose adjustments needed based on the patient's renal function, hepatic function, or hematologic function? No . Are appropriate labs ordered prior to the start of patient's treatment? Yes . Other organ system assessment, if indicated: N/A . The following baseline labs, if indicated, have been ordered: pembrolizumab: baseline TSH +/- T4  Dose Assessment: . Are the drug doses appropriate? Yes . Are the following correct: o Drug concentrations Yes o IV fluid compatible with drug Yes o Administration routes Yes o Timing of therapy Yes . If applicable, does the patient have documented access for treatment and/or plans for port-a-cath placement? not applicable . If applicable, have lifetime cumulative doses been properly documented and assessed? not applicable Lifetime Dose Tracking  No doses have been documented on this patient for the following tracked chemicals: Doxorubicin, Epirubicin, Idarubicin, Daunorubicin, Mitoxantrone, Bleomycin, Oxaliplatin, Carboplatin, Liposomal Doxorubicin  o   Toxicity Monitoring/Prevention: . The patient has the following take home antiemetics prescribed: N/A . The patient has the following take home medications prescribed: N/A . Medication allergies and previous infusion related reactions, if applicable, have been reviewed and addressed. Yes . The patient's current medication list has been assessed for drug-drug  interactions with their chemotherapy regimen. no significant drug-drug interactions were identified on review.  Order Review: . Are the treatment plan orders signed? No . Is the patient scheduled to see a provider prior to their treatment? Yes  I verify that I have reviewed each item in the above checklist and answered each question accordingly.  Telly Jawad K 05/24/2019 8:18 AM

## 2019-05-24 NOTE — Telephone Encounter (Signed)
Done...  MRI has been scheduled as requested  1st available date for MRI is 06/02/19 @ 8am Pt must arrive by 7:30am I noticed Dr.Yu placed and order in for pt to have a bone scan I went ahead and scheduled that as well. Please let me know if I need to call back to have it cx.

## 2019-05-24 NOTE — Patient Instructions (Signed)
Pembrolizumab injection Qu es este medicamento? El PEMBROLIZUMAB es un anticuerpo monoclonal. [place] para tratar ciertos tipos de cncer. Este medicamento puede ser utilizado para otros usos; si tiene alguna pregunta consulte con su proveedor de atencin mdica o con su farmacutico. MARCAS COMUNES: Keytruda Qu le debo informar a mi profesional de la salud antes de tomar este medicamento? Necesitan saber si usted presenta alguno de los [employer] o situaciones: diabetes problemas del sistema inmunolgico enfermedad intestinal inflamatoria enfermedad heptica enfermedad pulmonar o respiratoria lupus recibi o tiene programado recibir un trasplante de rgano o un trasplante de clulas madre que [place] clulas madre de un donante una reaccin alrgica o inusual al pembrolizumab, a otros medicamentos, alimentos, colorantes o conservantes si est embarazada o buscando quedar embarazada si est amamantando a un beb Cmo debo utilizar este medicamento? Este medicamento se administra mediante infusin por va intravenosa. Lo administra un profesional de Technical sales engineer en un hospital o en un entorno clnico. Se le entregar una Gua del medicamento (MedGuide, su nombre en ingls) especial antes de cada tratamiento. Asegrese de leer esta informacin cada vez cuidadosamente. Hable con su pediatra para informarse acerca del uso de este medicamento en nios. Aunque este medicamento se puede Advertising account executive, existen precauciones que deben tomarse. Sobredosis: Pngase en contacto inmediatamente con un centro toxicolgico o una sala de urgencia si usted cree que haya tomado demasiado medicamento. ATENCIN: [employer] es solo para usted. No comparta este medicamento con nadie. Qu sucede si me olvido de una dosis? Es importante de no olvidarse ninguna dosis. Informe a su mdico o a su profesional de la salud si no puede asistir a Photographer. Qu puede interactuar con este medicamento? No se  han estudiado las interacciones. Puede ser que esta lista no menciona todas las posibles interacciones. Informe a su profesional de [employer]	[place] de [employer] productos a base de hierbas, medicamentos de [place] o suplementos nutritivos que est tomando. Si usted fuma, consume bebidas alcohlicas o si utiliza drogas ilegales, indqueselo tambin a su profesional de [employer]	[place]. Algunas sustancias pueden interactuar con su medicamento. A qu debo estar atento al usar Coca[employer]a? Se supervisar su estado de salud atentamente mientras reciba este medicamento. Usted podr Runner, broadcasting/film/video de sangre mientras est usando este medicamento. No se debe quedar embarazada mientras recibe [employer]a o durante 4 meses de dejar de usarlo. Las mujeres deben informar a su mdico si estn buscando quedar embarazadas o si creen que estn embarazadas. Existe la posibilidad de efectos secundarios graves a un beb sin nacer. Para ms informacin consulte con su profesional de la salud o su farmacutico. No debe Economist a un beb mientras est usando este medicamento o durante 4 meses de la ltima dosis. Qu efectos secundarios puedo tener al [employer] este medicamento? Efectos secundarios que debe informar a su mdico o a Barrister's clerk de la salud tan pronto como sea posible: Chief of Staff, como erupcin cutnea, comezn/picazn o urticaria, e hinchazn de la cara, los labios o la lengua con sangre o de color negro y aspecto alquitranado problemas para respirar cambios en la visin dolor en el pecho escalofros confusin estreimiento tos diarrea mareo, sensacin de desmayo o aturdimiento frecuencia cardiaca rpida o irregular fiebre enrojecimiento dolor en las articulaciones recuentos sanguneos bajos: este medicamento podra reducir la cantidad de glbulos blancos, glbulos rojos y plaquetas. Su riesgo de infeccin y sangrado podra ser mayor. dolor muscular debilidad muscular dolor, hormigueo o  entumecimiento de las manos o  los pies dolor de cabeza persistente enrojecimiento, formacin de ampollas, descamacin o distensin de la piel, incluso dentro de la boca signos y sntomas de niveles elevados de azcar en la sangre, tales como mareo; boca seca; piel seca; aliento frutal; nuseas; dolor de estmago; aumento del apetito o la sed; aumento de la frecuencia urinaria signos y sntomas de lesin al rin, tales como dificultad para Garment/textile technologist o cambios en la cantidad de orina signos y sntomas de lesin al hgado, como orina oscura, heces claras, prdida de apetito, nuseas, dolor en la regin abdominal superior derecha, color amarillento de los ojos o la piel sudoracin ganglios linfticos inflamados prdida de peso Efectos secundarios que generalmente no requieren atencin mdica (infrmelos a su mdico o a su profesional de la salud si persisten o si son molestos): disminucin del apetito cada del cabello dolor muscular cansancio Puede ser que esta lista no menciona todos los posibles efectos secundarios. Comunquese a su mdico por asesoramiento mdico [employer] Usted puede informar los efectos secundarios a la FDA por telfono al 1-800-FDA-1088. Dnde debo guardar mi medicina? Este medicamento se administra en hospitales o clnicas y no necesitar guardarlo en su domicilio. ATENCIN: Este folleto es un resumen. Puede ser que no cubra toda la posible informacin. Si usted tiene preguntas acerca de esta medicina, consulte con su mdico, su farmacutico o su profesional de Technical sales engineer.  2020 Elsevier/Gold Standard (2018-08-24 00:00:00) Pembrolizumab injection What is this medicine? PEMBROLIZUMAB (pem broe liz ue mab) is a monoclonal antibody. It is used to treat certain types of cancer. This medicine may be used for other purposes; ask your health care provider or pharmacist if you have questions. COMMON BRAND NAME(S): Keytruda What should I tell my health care provider before  I take this medicine? They need to know if you have any of these conditions:  diabetes  immune system problems  inflammatory bowel disease  liver disease  lung or breathing disease  lupus  received or scheduled to receive an organ transplant or a stem-cell transplant that uses donor stem cells  an unusual or allergic reaction to pembrolizumab, other medicines, foods, dyes, or preservatives  pregnant or trying to get pregnant  breast-feeding How should I use this medicine? This medicine is for infusion into a vein. It is given by a health care professional in a hospital or clinic setting. A special MedGuide will be given to you before each treatment. Be sure to read this information carefully each time. Talk to your pediatrician regarding the use of this medicine in children. While this drug may be prescribed for children as young as 6 months for selected conditions, precautions do apply. Overdosage: If you think you have taken too much of this medicine contact a poison control center or emergency room at once. NOTE: This medicine is only for you. Do not share this medicine with others. What if I miss a dose? It is important not to miss your dose. Call your doctor or health care professional if you are unable to keep an appointment. What may interact with this medicine? Interactions have not been studied. Give your health care provider a list of all the medicines, herbs, non-prescription drugs, or dietary supplements you use. Also tell them if you smoke, drink alcohol, or use illegal drugs. Some items may interact with your medicine. This list may not describe all possible interactions. Give your health care provider a list of all the medicines, herbs, non-prescription drugs, or dietary supplements you use. Also tell  them if you smoke, drink alcohol, or use illegal drugs. Some items may interact with your medicine. What should I watch for while using this medicine? Your condition  will be monitored carefully while you are receiving this medicine. You may need blood work done while you are taking this medicine. Do not become pregnant while taking this medicine or for 4 months after stopping it. Women should inform their doctor if they wish to become pregnant or think they might be pregnant. There is a potential for serious side effects to an unborn child. Talk to your health care professional or pharmacist for more information. Do not breast-feed an infant while taking this medicine or for 4 months after the last dose. What side effects may I notice from receiving this medicine? Side effects that you should report to your doctor or health care professional as soon as possible:  allergic reactions like skin rash, itching or hives, swelling of the face, lips, or tongue  bloody or black, tarry  breathing problems  changes in vision  chest pain  chills  confusion  constipation  cough  diarrhea  dizziness or feeling faint or lightheaded  fast or irregular heartbeat  fever  flushing  joint pain  low blood counts - this medicine may decrease the number of white blood cells, red blood cells and platelets. You may be at increased risk for infections and bleeding.  muscle pain  muscle weakness  pain, tingling, numbness in the hands or feet  persistent headache  redness, blistering, peeling or loosening of the skin, including inside the mouth  signs and symptoms of high blood sugar such as dizziness; dry mouth; dry skin; fruity breath; nausea; stomach pain; increased hunger or thirst; increased urination  signs and symptoms of kidney injury like trouble passing urine or change in the amount of urine  signs and symptoms of liver injury like dark urine, light-colored stools, loss of appetite, nausea, right upper belly pain, yellowing of the eyes or skin  sweating  swollen lymph nodes  weight loss Side effects that usually do not require medical  attention (report to your doctor or health care professional if they continue or are bothersome):  decreased appetite  hair loss  muscle pain  tiredness This list may not describe all possible side effects. Call your doctor for medical advice about side effects. You may report side effects to FDA at 1-800-FDA-1088. Where should I keep my medicine? This drug is given in a hospital or clinic and will not be stored at home. NOTE: This sheet is a summary. It may not cover all possible information. If you have questions about this medicine, talk to your doctor, pharmacist, or health care provider.  2020 Elsevier/Gold Standard (2018-11-05 18:07:58)

## 2019-05-24 NOTE — Telephone Encounter (Signed)
-----   Message from Earlie Server, MD sent at 05/23/2019  5:18 PM EDT ----- Please let him know that I also recommend him to get MRI brain w/wo to complete staging. Please arrange. Thanks.

## 2019-05-24 NOTE — Telephone Encounter (Signed)
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  His lab/MD/tx appt has been moved to 5/25.  He is also scheduled for his pre-treatment COVID test on 5/14.

## 2019-05-24 NOTE — Telephone Encounter (Signed)
Called patient this morning and he will bring his financial documents and sign the application before/after his chemo class on 5/12.

## 2019-05-25 ENCOUNTER — Other Ambulatory Visit: Payer: Self-pay

## 2019-05-25 ENCOUNTER — Inpatient Hospital Stay: Payer: Self-pay

## 2019-05-25 ENCOUNTER — Inpatient Hospital Stay (HOSPITAL_BASED_OUTPATIENT_CLINIC_OR_DEPARTMENT_OTHER): Payer: Self-pay | Admitting: Oncology

## 2019-05-25 DIAGNOSIS — C642 Malignant neoplasm of left kidney, except renal pelvis: Secondary | ICD-10-CM

## 2019-05-25 NOTE — Telephone Encounter (Signed)
Pt notified of all appts and AVS handed to him.

## 2019-05-25 NOTE — Progress Notes (Signed)
Mark Donaldson  [phone] [phone]  Patient Care Team: Frazier Richards, MD as PCP - General (Family Medicine)   Name of the patient: Mark Donaldson  [id number]  Oct 09, 1960   Date of visit: 05/25/19  Diagnosis- Renal Cell   Chief complaint/Reason for visit- Initial Meeting for [facility], preparing for starting chemotherapy  Heme/Onc history:  Oncology History  Renal cell carcinoma of left kidney ([place])  05/23/2019 Initial Diagnosis   Renal cell carcinoma of left kidney ([place])   05/23/2019 Cancer Staging   Staging form: Kidney, AJCC 8th Edition - Pathologic: Stage IV (pT3a, pNX, cM1) - Signed by Earlie Server, MD on 05/23/2019   Renal cell carcinoma ([place])  05/23/2019 Initial Diagnosis   Renal cell carcinoma ([place])   05/31/2019 -  Chemotherapy   The patient had pembrolizumab (KEYTRUDA) 200 mg in sodium chloride 0.9 % 50 mL chemo infusion, 200 mg, Intravenous, Once, 0 of 6 cycles  for chemotherapy treatment.      Interval history-  Mr. Mark Donaldson is a 59 year old male who presents to chemo care clinic today for initial meeting in preparation for starting chemotherapy. I introduced the chemo care clinic and we discussed that the role of the clinic is to assist those who are at an increased risk of emergency room visits and/or complications during the course of chemotherapy treatment. We discussed that the increased risk takes into account factors such as age, performance status, and co-morbidities. We also discussed that for some, this might include barriers to care such as not having a primary care provider, lack of insurance/transportation, or not being able to afford medications. We discussed that the goal of the program is to help prevent unplanned ER visits and help reduce complications during chemotherapy. We do this by discussing specific risk factors to each individual and identifying ways that we can  help improve these risk factors and reduce barriers to care.   ECOG FS:1 - Symptomatic but completely ambulatory  Review of systems- Review of Systems  Constitutional: Negative.  Negative for chills, fever, malaise/fatigue and weight loss.  HENT: Negative for congestion, ear pain and tinnitus.   Eyes: Negative.  Negative for blurred vision and double vision.  Respiratory: Negative.  Negative for cough, sputum production and shortness of breath.   Cardiovascular: Negative.  Negative for chest pain, palpitations and leg swelling.  Gastrointestinal: Negative.  Negative for abdominal pain, constipation, diarrhea, nausea and vomiting.  Genitourinary: Negative for dysuria, frequency and urgency.  Musculoskeletal: Negative for back pain and falls.  Skin: Negative.  Negative for rash.  Neurological: Negative.  Negative for weakness and headaches.  Endo/Heme/Allergies: Negative.  Does not bruise/bleed easily.  Psychiatric/Behavioral: Negative.  Negative for depression. The patient is not nervous/anxious and does not have insomnia.      Current treatment- Inlyta + Keytruda  No Known Allergies  Past Medical History:  Diagnosis Date  . 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 CAD (coronary artery disease)   . Essential hypertension   . HFrEF (heart failure with reduced ejection fraction) ([place])   . Hyperlipidemia LDL goal <70   . Ischemic cardiomyopathy   . Renal cell carcinoma ([place]) 05/23/2019  . Renal disorder     Past Surgical History:  Procedure Laterality Date  . CARDIAC SURGERY     cardiac cath with 2 stent placement  . LAPAROSCOPIC NEPHRECTOMY, HAND ASSISTED Left 05/05/2019   Procedure: HAND ASSISTED LAPAROSCOPIC NEPHRECTOMY;  Surgeon: Erlene Quan,  Caryl Pina, MD;  Location: [facility] ORS;  Service: Urology;  Laterality: Left;    Social History   Socioeconomic History  . Marital status: Married    Spouse name: Not on file  . Number of children: Not on file  . Years of education: Not on file  .  Highest education level: Not on file  Occupational History  . Not on file  Tobacco Use  . Smoking status: Never Smoker  . Smokeless tobacco: Never Used  Substance and Sexual Activity  . Alcohol use: Yes  . Drug use: Not on file  . Sexual activity: Not on file  Other Topics Concern  . Not on file  Social History Narrative  . Not on file   Social Determinants of Health   Financial Resource Strain:   . Difficulty of Paying Living Expenses:   Food Insecurity:   . Worried About Charity fundraiser in the Last Year:   . Arboriculturist in the Last Year:   Transportation Needs:   . Film/video editor (Medical):   Marland Kitchen Lack of Transportation (Non-Medical):   Physical Activity:   . Days of Exercise per Week:   . Minutes of Exercise per Session:   Stress:   . Feeling of Stress :   Social Connections:   . Frequency of Communication with Friends and Family:   . Frequency of Social Gatherings with Friends and Family:   . Attends Religious Services:   . Active Member of Clubs or Organizations:   . Attends Archivist Meetings:   Marland Kitchen Marital Status:   Intimate Partner Violence:   . Fear of Current or Ex-Partner:   . Emotionally Abused:   Marland Kitchen Physically Abused:   . Sexually Abused:     Family History  Problem Relation Age of Onset  . Cancer Mother   . Blindness Brother      Current Outpatient Medications:  .  amLODipine (NORVASC) 10 MG tablet, Take 1 tablet (10 mg total) by mouth daily., Disp: 90 tablet, Rfl: 1 .  aspirin 81 MG chewable tablet, Chew 1 tablet by mouth daily., Disp: , Rfl:  .  atorvastatin (LIPITOR) 80 MG tablet, Take 1 tablet by mouth at bedtime., Disp: , Rfl:  .  axitinib (INLYTA) 1 MG tablet, Take 5 tablets (5 mg total) by mouth 2 (two) times daily., Disp: 140 tablet, Rfl: 0 .  clopidogrel (PLAVIX) 75 MG tablet, Take 1 tablet by mouth daily., Disp: , Rfl:  .  furosemide (LASIX) 20 MG tablet, Take 1 tablet (20 mg total) by mouth daily., Disp: 30 tablet,  Rfl: 1 .  furosemide (LASIX) 40 MG tablet, Take 40 mg by mouth., Disp: , Rfl:  .  HYDROcodone-acetaminophen (NORCO/VICODIN) 5-325 MG tablet, Take 1 tablet by mouth every 6 (six) hours as needed for moderate pain., Disp: , Rfl:  .  lisinopril (ZESTRIL) 10 MG tablet, Take 1 tablet by mouth daily., Disp: , Rfl:  .  metoprolol succinate (TOPROL-XL) 100 MG 24 hr tablet, Take 1.5 tablets by mouth daily., Disp: , Rfl:  .  senna-docusate (SENOKOT-S) 8.6-50 MG tablet, Take 1 tablet by mouth 2 (two) times daily between meals as needed for mild constipation., Disp: 20 tablet, Rfl: 0  Physical exam: There were no vitals filed for this visit. Physical Exam Constitutional:      Appearance: Normal appearance.  HENT:     Head: Normocephalic and atraumatic.  Eyes:     Pupils: Pupils are equal, round, and reactive  to light.  Cardiovascular:     Rate and Rhythm: Normal rate and regular rhythm.     Heart sounds: Normal heart sounds. No murmur.  Pulmonary:     Effort: Pulmonary effort is normal.     Breath sounds: Normal breath sounds. No wheezing.  Abdominal:     General: Bowel sounds are normal. There is no distension.     Palpations: Abdomen is soft.     Tenderness: There is no abdominal tenderness.  Musculoskeletal:        General: Normal range of motion.     Cervical back: Normal range of motion.  Skin:    General: Skin is warm and dry.     Findings: No rash.  Neurological:     Mental Status: He is alert and oriented to person, place, and time.  Psychiatric:        Judgment: Judgment normal.      CMP Latest Ref Rng & Units 05/23/2019  Glucose 70 - 99 mg/dL 99  BUN 6 - 20 mg/dL 45(H)  Creatinine 0.61 - 1.24 mg/dL 1.93(H)  Sodium 135 - 145 mmol/L 132(L)  Potassium 3.5 - 5.1 mmol/L 4.9  Chloride 98 - 111 mmol/L 95(L)  CO2 22 - 32 mmol/L 26  Calcium 8.9 - 10.3 mg/dL 9.0  Total Protein 6.5 - 8.1 g/dL 7.4  Total Bilirubin 0.3 - 1.2 mg/dL 0.5  Alkaline Phos 38 - 126 U/L 95  AST 15 - 41 U/L  28  ALT 0 - 44 U/L 26   CBC Latest Ref Rng & Units 05/23/2019  WBC 4.0 - 10.5 K/uL 11.1(H)  Hemoglobin 13.0 - 17.0 g/dL 13.9  Hematocrit 39.0 - 52.0 % 41.5  Platelets 150 - 400 K/uL 312    No images are attached to the encounter.  CT ABDOMEN PELVIS W WO CONTRAST  Result Date: 05/03/2019 CLINICAL DATA:  Renal mass. EXAM: CT CHEST WITH CONTRAST CT ABDOMEN AND PELVIS WITH AND WITHOUT CONTRAST TECHNIQUE: Multidetector CT imaging of the chest was performed during intravenous contrast administration. Multidetector CT imaging of the abdomen and pelvis was performed following the standard protocol before and during bolus administration of intravenous contrast. CONTRAST:  145mL OMNIPAQUE IOHEXOL 300 MG/ML  SOLN COMPARISON:  05/02/2019 CT stone study. FINDINGS: CT CHEST FINDINGS Cardiovascular: The heart size is normal. No substantial pericardial effusion. Coronary artery calcification is evident. No thoracic aortic aneurysm. Mediastinum/Nodes: Necrotic lymph nodes are identified in the right mediastinum along the pleural surface and right paratracheal space. Nodal conglomeration to the right of the trachea measures 3.5 x 1.9 cm on 22/3. No left hilar lymphadenopathy. The esophagus has normal imaging features. There is no axillary lymphadenopathy. 15 mm short axis right retrocrural node is visible on 34/18. Lungs/Pleura: 1.7 cm perifissural nodule noted on the minor fissure (82/5). 8 mm posterior left lower lobe nodule seen on 78/5. 5 mm medial right upper lobe nodule visible on 57/5. Dependent atelectasis noted in both lower lobes. Tiny bilateral pleural effusions evident. Musculoskeletal: No worrisome lytic or sclerotic osseous abnormality. CT ABDOMEN AND PELVIS FINDINGS Hepatobiliary: No suspicious focal abnormality within the liver parenchyma. There is no evidence for gallstones, gallbladder wall thickening, or pericholecystic fluid. No intrahepatic or extrahepatic biliary dilation. Pancreas: No focal mass  lesion. No dilatation of the main duct. No intraparenchymal cyst. No peripancreatic edema. Spleen: No splenomegaly. No focal mass lesion. Adrenals/Urinary Tract: No adrenal nodule or mass. Right kidney unremarkable. 6.4 x 6.7 x 7.0 cm heterogeneously enhancing mass is identified in the  lower pole left kidney. Lesion extends into the central sinus fat and displaces the renal pelvis and proximal ureter posteriorly. Left renal vein is patent. No evidence for hydroureter. The urinary bladder appears normal for the degree of distention. Stomach/Bowel: Stomach is unremarkable. No gastric wall thickening. No evidence of outlet obstruction. Duodenum is normally positioned as is the ligament of Treitz. No small bowel wall thickening. No small bowel dilatation. The terminal ileum is normal. The appendix is normal. No gross colonic mass. No colonic wall thickening. Vascular/Lymphatic: No abdominal aortic aneurysm. 9 mm left para-aortic node on 66/18 is not enlarged by CT criteria but is suspicious for metastatic disease. No pelvic sidewall lymphadenopathy. Reproductive: The prostate gland and seminal vesicles are unremarkable. Other: No intraperitoneal free fluid. Musculoskeletal: No worrisome lytic or sclerotic osseous abnormality. 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 IMPRESSION: 1. 7 cm heterogeneous mass lesion in the lower pole left kidney is compatible with renal cell carcinoma. Mass generates mass-effect on the left renal pelvis and proximal left ureter. Left renal vein patent. 2. Necrotic lymphadenopathy in the mediastinum and right suprahilar region is highly suspicious for metastatic disease. Bilateral pulmonary nodules also raise concern for metastatic involvement. 3. Right retrocrural lymphadenopathy with upper normal left para-aortic lymph node also suggest metastatic involvement. 4. 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 Electronically Signed    By: Misty Stanley M.D.   On: 05/03/2019 13:35   CT CHEST W CONTRAST  Result Date: 05/03/2019 CLINICAL DATA:  Renal mass. EXAM: CT CHEST WITH CONTRAST CT ABDOMEN AND PELVIS WITH AND WITHOUT CONTRAST TECHNIQUE: Multidetector CT imaging of the chest was performed during intravenous contrast administration. Multidetector CT imaging of the abdomen and pelvis was performed following the standard protocol before and during bolus administration of intravenous contrast. CONTRAST:  189mL OMNIPAQUE IOHEXOL 300 MG/ML  SOLN COMPARISON:  05/02/2019 CT stone study. FINDINGS: CT CHEST FINDINGS Cardiovascular: The heart size is normal. No substantial pericardial effusion. Coronary artery calcification is evident. No thoracic aortic aneurysm. Mediastinum/Nodes: Necrotic lymph nodes are identified in the right mediastinum along the pleural surface and right paratracheal space. Nodal conglomeration to the right of the trachea measures 3.5 x 1.9 cm on 22/3. No left hilar lymphadenopathy. The esophagus has normal imaging features. There is no axillary lymphadenopathy. 15 mm short axis right retrocrural node is visible on 34/18. Lungs/Pleura: 1.7 cm perifissural nodule noted on the minor fissure (82/5). 8 mm posterior left lower lobe nodule seen on 78/5. 5 mm medial right upper lobe nodule visible on 57/5. Dependent atelectasis noted in both lower lobes. Tiny bilateral pleural effusions evident. Musculoskeletal: No worrisome lytic or sclerotic osseous abnormality. CT ABDOMEN AND PELVIS FINDINGS Hepatobiliary: No suspicious focal abnormality within the liver parenchyma. There is no evidence for gallstones, gallbladder wall thickening, or pericholecystic fluid. No intrahepatic or extrahepatic biliary dilation. Pancreas: No focal mass lesion. No dilatation of the main duct. No intraparenchymal cyst. No peripancreatic edema. Spleen: No splenomegaly. No focal mass lesion. Adrenals/Urinary Tract: No adrenal nodule or mass. Right kidney  unremarkable. 6.4 x 6.7 x 7.0 cm heterogeneously enhancing mass is identified in the lower pole left kidney. Lesion extends into the central sinus fat and displaces the renal pelvis and proximal ureter posteriorly. Left renal vein is patent. No evidence for hydroureter. The urinary bladder appears normal for the degree of distention. Stomach/Bowel: Stomach is unremarkable. No gastric wall thickening. No evidence of outlet obstruction. Duodenum is normally positioned as is the  ligament of Treitz. No small bowel wall thickening. No small bowel dilatation. The terminal ileum is normal. The appendix is normal. No gross colonic mass. No colonic wall thickening. Vascular/Lymphatic: No abdominal aortic aneurysm. 9 mm left para-aortic node on 66/18 is not enlarged by CT criteria but is suspicious for metastatic disease. No pelvic sidewall lymphadenopathy. Reproductive: The prostate gland and seminal vesicles are unremarkable. Other: No intraperitoneal free fluid. Musculoskeletal: No worrisome lytic or sclerotic osseous abnormality. 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 IMPRESSION: 1. 7 cm heterogeneous mass lesion in the lower pole left kidney is compatible with renal cell carcinoma. Mass generates mass-effect on the left renal pelvis and proximal left ureter. Left renal vein patent. 2. Necrotic lymphadenopathy in the mediastinum and right suprahilar region is highly suspicious for metastatic disease. Bilateral pulmonary nodules also raise concern for metastatic involvement. 3. Right retrocrural lymphadenopathy with upper normal left para-aortic lymph node also suggest metastatic involvement. 4. 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 Electronically Signed   By: Misty Stanley M.D.   On: 05/03/2019 13:35   ECHOCARDIOGRAM COMPLETE  Result Date: 05/04/2019    ECHOCARDIOGRAM REPORT   Patient Name:   WYLIE COON Date of Exam: 05/04/2019 Medical  Rec #:  [id number]                Height:       65.0 in Accession #:    [id number]               Weight:       154.1 lb Date of Birth:  17-Jan-1960               BSA:          1.771 m Patient Age:    58 years                 BP:           153/88 mmHg Patient Gender: M                        HR:           75 bpm. Exam Location:  [facility] Procedure: 2D Echo, Cardiac Doppler and Color Doppler Indications:     CHF- 428.0  History:         Patient has no prior history of Echocardiogram examinations.                  Risk Factors:Hypertension. Ischemic cardiomyopathy.  Sonographer:     Sherrie Sport RDCS (AE) Referring Phys:  Lerry Liner [facility] Diagnosing Phys: Kate Sable MD IMPRESSIONS  1. Left ventricular ejection fraction, by estimation, is 60 to 65%. The left ventricle has normal function. The left ventricle has no regional wall motion abnormalities. There is mild left ventricular hypertrophy. Left ventricular diastolic parameters are consistent with Grade I diastolic dysfunction (impaired relaxation).  2. Right ventricular systolic function is normal. The right ventricular size is normal.  3. Left atrial size was mildly dilated.  4. The mitral valve is normal in structure. Trivial mitral valve regurgitation.  5. The aortic valve is tricuspid. Aortic valve regurgitation is not visualized. Mild aortic valve sclerosis is present, with no evidence of aortic valve stenosis. FINDINGS  Left Ventricle: Left ventricular ejection fraction, by estimation, is 60 to 65%. The left ventricle has normal function. The left ventricle has no regional wall motion abnormalities. The left ventricular  internal cavity size was normal in size. There is  mild left ventricular hypertrophy. Left ventricular diastolic parameters are consistent with Grade I diastolic dysfunction (impaired relaxation). Right Ventricle: The right ventricular size is normal. Right vetricular wall thickness was not assessed. Right ventricular systolic function is  normal. Left Atrium: Left atrial size was mildly dilated. Right Atrium: Right atrial size was normal in size. Pericardium: There is no evidence of pericardial effusion. Mitral Valve: The mitral valve is normal in structure. Trivial mitral valve regurgitation. Tricuspid Valve: The tricuspid valve is normal in structure. Tricuspid valve regurgitation is not demonstrated. Aortic Valve: The aortic valve is tricuspid. Aortic valve regurgitation is not visualized. Mild aortic valve sclerosis is present, with no evidence of aortic valve stenosis. Aortic valve mean gradient measures 1.5 mmHg. Aortic valve peak gradient measures 3.4 mmHg. Aortic valve area, by VTI measures 3.33 cm. Pulmonic Valve: The pulmonic valve was normal in structure. Pulmonic valve regurgitation is not visualized. Aorta: The aortic root is normal in size and structure. Venous: The inferior vena cava was not well visualized. IAS/Shunts: No atrial level shunt detected by color flow Doppler.  LEFT VENTRICLE PLAX 2D LVIDd:         4.68 cm  Diastology LVIDs:         2.65 cm  LV e' lateral:   8.27 cm/s LV PW:         1.35 cm  LV E/e' lateral: 7.9 LV IVS:        1.37 cm  LV e' medial:    6.42 cm/s LVOT diam:     2.00 cm  LV E/e' medial:  10.1 LV SV:         64 LV SV Index:   36 LVOT Area:     3.14 cm  RIGHT VENTRICLE RV Basal diam:  3.80 cm RV S prime:     10.20 cm/s TAPSE (M-mode): 3.2 cm LEFT ATRIUM             Index       RIGHT ATRIUM           Index LA diam:        4.40 cm 2.48 cm/m  RA Area:     13.40 cm LA Vol (A2C):   60.7 ml 34.28 ml/m RA Volume:   30.70 ml  17.34 ml/m LA Vol (A4C):   69.9 ml 39.48 ml/m LA Biplane Vol: 66.0 ml 37.27 ml/m  AORTIC VALVE                   PULMONIC VALVE AV Area (Vmax):    2.98 cm    PV Vmax:        0.79 m/s AV Area (Vmean):   3.30 cm    PV Peak grad:   2.5 mmHg AV Area (VTI):     3.33 cm    RVOT Peak grad: 2 mmHg AV Vmax:           91.80 cm/s AV Vmean:          54.900 cm/s AV VTI:            0.192 m AV Peak  Grad:      3.4 mmHg AV Mean Grad:      1.5 mmHg LVOT Vmax:         87.20 cm/s LVOT Vmean:        57.600 cm/s LVOT VTI:          0.203 m LVOT/AV VTI ratio:  1.06  AORTA Ao Root diam: 2.90 cm MITRAL VALVE MV Area (PHT): 2.62 cm    SHUNTS MV Decel Time: 290 msec    Systemic VTI:  0.20 m MV E velocity: 65.00 cm/s  Systemic Diam: 2.00 cm MV A velocity: 91.70 cm/s MV E/A ratio:  0.71 Kate Sable MD Electronically signed by Kate Sable MD Signature Date/Time: 05/04/2019/6:34:27 PM    Final    CT Renal Stone Study  Result Date: 05/02/2019 CLINICAL DATA:  Left flank pain, lower back pain EXAM: CT ABDOMEN AND PELVIS WITHOUT CONTRAST TECHNIQUE: Multidetector CT imaging of the abdomen and pelvis was performed following the standard protocol without IV contrast. COMPARISON:  None. FINDINGS: Lower chest: Lung bases are clear. No effusions. Heart is normal size. Hepatobiliary: No focal hepatic abnormality. Gallbladder unremarkable. Pancreas: No focal abnormality or ductal dilatation. Spleen: No focal abnormality.  Normal size. Adrenals/Urinary Tract: Large solid mass is seen in the lower pole of the left kidney measuring 7.4 x 6.9 cm. Central calcifications noted. Findings concerning for renal cell carcinoma. There is mild left hydronephrosis which may be related to mass effect from the mass on the adjacent proximal left ureter. High-density material is seen within the left renal collecting system and ureter compatible with blood. No hydronephrosis on the right. Adrenal glands are unremarkable. Bladder wall appears diffusely thickened. Stomach/Bowel: Normal appendix. Stomach, large and small bowel grossly unremarkable. Vascular/Lymphatic: No evidence of aneurysm or adenopathy. Reproductive: Prostate enlargement. Other: 2 No free fluid or free air. Musculoskeletal: No acute bony abnormality. IMPRESSION: Large solid mass involving the lower pole of the left kidney concerning for renal cell carcinoma measuring up to  7.4 cm. Mild left hydronephrosis, possibly related to mass effect or blood clot within the left ureter. Diffusely thick walled bladder, likely related to prostate enlargement. Electronically Signed   By: Rolm Baptise M.D.   On: 05/02/2019 01:24    Assessment and plan- Patient is a 59 y.o. male who presents to [facility] for initial meeting in preparation for starting chemotherapy for the treatment of renal cell cancer.   1. HPI: Mr. 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  He was hospitalized from 05/02/19-05/07/19 due to gross hematuria.  CT abdomen showed a left renal mass concerning for RCC.  He was also found to have necrotic lymphadenopathy in the mediastinum and right suprahilar region which is highly suspicious for metastatic disease.  Bilateral lung nodules also concerning for metastatic disease.  Patient underwent left radical nephrectomy on 05/05/2019.  Pathology revealed clear cell renal carcinoma.  Plan is for Inlyta along with Keytruda.  He will initiate treatment next week.  2. Chemo Care Clinic/High Risk for ER/Hospitalization during chemotherapy- We discussed the role of the chemo care clinic and identified patient specific risk factors. I discussed that patient was identified as high risk primarily based on: Stage of disease and comorbidities.  Patient has past medical history positive for: Past Medical History:  Diagnosis Date  . 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 CAD (coronary artery disease)   . Essential hypertension   . HFrEF (heart failure with reduced ejection fraction) ([place])   . Hyperlipidemia LDL goal <70   . Ischemic cardiomyopathy   . Renal cell carcinoma ([place]) 05/23/2019  . Renal disorder     Patient has past surgical history positive for: Past Surgical  History:  Procedure Laterality Date  . CARDIAC  SURGERY     cardiac cath with 2 stent placement  . LAPAROSCOPIC NEPHRECTOMY, HAND ASSISTED Left 05/05/2019   Procedure: HAND ASSISTED LAPAROSCOPIC NEPHRECTOMY;  Surgeon: Hollice Espy, MD;  Location: [facility] ORS;  Service: Urology;  Laterality: Left;    Based on our high risk symptom management report; this patient has a high risk of ED utilization.  The percentage below indicates how "at risk "  this patient based on the factors in this table within one year.   General Risk Score: 4  Values used to calculate this score:   Points  Metrics      0        Age: 37      2        Hospital Admissions: 2      1        ED Visits: 1      0        Has Chronic Obstructive Pulmonary Disease: No      0        Has Diabetes: No      1        Has Congestive Heart Failure: Yes      0        Has liver disease: No      0        Has Depression: No      0        Current PCP: Frazier Richards, MD      0        Has Medicaid: No  3. We discussed that social determinants of health may have significant impacts on health and outcomes for cancer patients.  Today we discussed specific social determinants of performance status, alcohol use, depression, financial needs, food insecurity, housing, interpersonal violence, social connections, stress, tobacco use, and transportation.    After lengthy discussion the following were identified as areas of need: Patient is currently not working and his wife is not working either.  They are interested in any type of financial help that is available to them.  I will get them in touch with Elease Etienne our social worker to give him more information.  Outpatient services: We discussed options including home based and outpatient services, DME and care program. We discusssed that patients who participate in regular physical activity report fewer negative impacts of cancer and treatments and report less fatigue.   Financial Concerns: We discussed that living with cancer can create tremendous  financial burden.  We discussed options for assistance. I asked that if assistance is needed in affording medications or paying bills to please let us know so that we can provide assistance. We discussed options for food including social services, [employer] ($50 every 2 weeks) and onsite food pantry.  We will also notify [facility] to see if cancer center can provide additional support.  Referral to Social work: Introduced Education officer, museum Elease Etienne and the services he can provide such as support with [employer], cell phone and gas vouchers.   Support groups: We discussed options for support groups at the cancer center. If interested, please notify nurse navigator to enroll. We discussed options for managing stress including healthy eating, exercise as well as participating in no charge counseling services at the cancer center and support groups.  If these are of interest, patient can notify either myself or primary nursing team.We discussed options for management including medications and  referral to quit Smart program  Transportation: We discussed options for transportation including acta, paratransit, bus routes, link transit, taxi/uber/lyft, and cancer center [place].  I have notified primary oncology team who will help assist with arranging Lucianne Lei transportation for appointments when/if needed. We also discussed options for transportation on short notice/acute visits.  Palliative care services: We have palliative care services available in the cancer center to discuss goals of care and advanced care planning.  Please let us know if you have any questions or would like to speak to our palliative nurse practitioner.  Symptom Management Clinic: We discussed our symptom management clinic which is available for acute concerns while receiving treatment such as nausea, vomiting or diarrhea.  We can be reached via telephone at [phone] or through my chart.  We are available for virtual or in person  visits on the same day from 830 to 4 PM Monday through Friday. He denies needing specific assistance at this time and He will be followed by Dr. Collie Siad clinical team.  Plan: Discussed symptom management clinic. Discussed palliative care services. Discussed resources that are available here at the cancer center. Discussed medications and new prescriptions to begin treatment such as anti-nausea or steroids.   Disposition: RTC on 05/30/2019 for bone scan. RTC on 06/02/2019 for MRI of the brain RTC on 06/07/2019 for labs, MD assessment and cycle 1 Keytruda.  Visit Diagnosis No diagnosis found.  Patient expressed understanding and was in agreement with this plan. He also understands that He can call clinic at any time with any questions, concerns, or complaints.   Greater than 50% was spent in counseling and coordination of care with this patient including but not limited to discussion of the relevant topics above (See A&P) including, but not limited to diagnosis and management of acute and chronic medical conditions.   [facility]  CC: Dr. Tasia Catchings

## 2019-05-25 NOTE — Telephone Encounter (Signed)
Oral Oncology Patient Advocate Encounter  Met patient in conference room to complete application for [employer] Patient Assistance in an effort to reduce patient's out of pocket expense for Inlyta to $0.    Application completed and faxed to [phone].   [employer] patient assistance phone number for follow up is [phone].   This encounter will be updated until final determination.   [place] Patient [place] Phone [phone] Fax [phone] 05/25/2019 12:23 PM

## 2019-05-27 ENCOUNTER — Ambulatory Visit: Payer: MEDICAID | Attending: Internal Medicine

## 2019-05-27 DIAGNOSIS — Z20822 Contact with and (suspected) exposure to covid-19: Secondary | ICD-10-CM

## 2019-05-28 LAB — NOVEL CORONAVIRUS, NAA: SARS-CoV-2, NAA: NOT DETECTED

## 2019-05-28 LAB — SARS-COV-2, NAA 2 DAY TAT

## 2019-05-30 ENCOUNTER — Encounter
Admission: RE | Admit: 2019-05-30 | Discharge: 2019-05-30 | Disposition: A | Discharge status: Home / Self Care | Payer: Self-pay | Source: Ambulatory Visit | Attending: Oncology | Admitting: Oncology

## 2019-05-30 ENCOUNTER — Other Ambulatory Visit: Payer: Self-pay

## 2019-05-30 ENCOUNTER — Encounter: Payer: Self-pay | Admitting: Pharmacy Technician

## 2019-05-30 DIAGNOSIS — Z7189 Other specified counseling: Secondary | ICD-10-CM | POA: Insufficient documentation

## 2019-05-30 DIAGNOSIS — C642 Malignant neoplasm of left kidney, except renal pelvis: Secondary | ICD-10-CM | POA: Insufficient documentation

## 2019-05-30 MED ORDER — TECHNETIUM TC 99M MEDRONATE IV KIT
22.0000 | PACK | Freq: Once | INTRAVENOUS | Status: AC | PRN
  Administered 2019-05-30: 22.76 via INTRAVENOUS

## 2019-05-30 NOTE — Telephone Encounter (Signed)
3rd attempt to reach patient unable to leave VM-will mail letter  Via Interpreter services [phone]

## 2019-05-30 NOTE — Progress Notes (Signed)
Patient has been approved for drug assistance by [employer] for [employer]. The enrollment period is from 05/26/19-05/24/20 based on self pay. First DOS covered is 06/07/19.

## 2019-05-31 ENCOUNTER — Inpatient Hospital Stay: Payer: Self-pay | Admitting: Oncology

## 2019-05-31 ENCOUNTER — Inpatient Hospital Stay: Payer: Self-pay

## 2019-05-31 NOTE — Progress Notes (Signed)
Pharmacist Chemotherapy Monitoring - Initial Assessment    Anticipated start date: 06/07/19  Regimen:  . Are orders appropriate based on the patient's diagnosis, regimen, and cycle? Yes . Does the plan date match the patient's scheduled date? Yes . Is the sequencing of drugs appropriate? Yes . Are the premedications appropriate for the patient's regimen? Yes . Prior Authorization for treatment is: Approved o If applicable, is the correct biosimilar selected based on the patient's insurance? not applicable  Organ Function and Labs: Marland Kitchen Are dose adjustments needed based on the patient's renal function, hepatic function, or hematologic function? No . Are appropriate labs ordered prior to the start of patient's treatment? No . Other organ system assessment, if indicated: N/A . The following baseline labs, if indicated, have been ordered: pembrolizumab: baseline TSH +/- T4  Dose Assessment: . Are the drug doses appropriate? Yes . Are the following correct: o Drug concentrations Yes o IV fluid compatible with drug Yes o Administration routes Yes o Timing of therapy Yes . If applicable, does the patient have documented access for treatment and/or plans for port-a-cath placement? no . If applicable, have lifetime cumulative doses been properly documented and assessed? no Lifetime Dose Tracking  No doses have been documented on this patient for the following tracked chemicals: Doxorubicin, Epirubicin, Idarubicin, Daunorubicin, Mitoxantrone, Bleomycin, Oxaliplatin, Carboplatin, Liposomal Doxorubicin  o   Toxicity Monitoring/Prevention: . The patient has the following take home antiemetics prescribed: Ondansetron . The patient has the following take home medications prescribed: N/A . Medication allergies and previous infusion related reactions, if applicable, have been reviewed and addressed. Yes . The patient's current medication list has been assessed for drug-drug interactions with their  chemotherapy regimen. no significant drug-drug interactions were identified on review.  Order Review: . Are the treatment plan orders signed? Yes . Is the patient scheduled to see a provider prior to their treatment? Yes  I verify that I have reviewed each item in the above checklist and answered each question accordingly.  Mark Donaldson 05/31/2019 8:27 AM

## 2019-05-31 NOTE — Telephone Encounter (Signed)
Called yesterday, 05/30/19, to check the status of application and rep stated that page 3 of the form had changed and to submit the new form.  I faxed the form back to [employer] yesterday.

## 2019-06-01 NOTE — Telephone Encounter (Signed)
Medication is scheduled to be delivered to patient on Friday 06/03/19.

## 2019-06-01 NOTE — Telephone Encounter (Signed)
Oral Oncology Patient Advocate Encounter  Received notification from [place] Patient Assistance program that patient has been successfully enrolled into their program to receive Inlyta from the manufacturer at $0 out of pocket until 05/31/2020.    I called and spoke with patient.  He knows we will have to re-apply.   Specialty Pharmacy that will dispense medication is [facility].  Patient knows to call the office with questions or concerns.   Oral Oncology Clinic will continue to follow.  [place] Patient [place] Phone [phone] Fax [phone] 06/01/2019 9:30 AM

## 2019-06-02 ENCOUNTER — Ambulatory Visit: Payer: Self-pay

## 2019-06-06 ENCOUNTER — Other Ambulatory Visit: Payer: Self-pay

## 2019-06-06 DIAGNOSIS — C642 Malignant neoplasm of left kidney, except renal pelvis: Secondary | ICD-10-CM

## 2019-06-07 ENCOUNTER — Other Ambulatory Visit: Payer: Self-pay

## 2019-06-07 ENCOUNTER — Inpatient Hospital Stay: Payer: Self-pay

## 2019-06-07 ENCOUNTER — Inpatient Hospital Stay (HOSPITAL_BASED_OUTPATIENT_CLINIC_OR_DEPARTMENT_OTHER): Payer: Self-pay | Admitting: Oncology

## 2019-06-07 ENCOUNTER — Encounter: Payer: Self-pay | Admitting: Oncology

## 2019-06-07 VITALS — BP 100/65 | HR 76 | Temp 96.4°F | Resp 18 | Wt 154.5 lb

## 2019-06-07 DIAGNOSIS — N1832 Chronic kidney disease, stage 3b: Secondary | ICD-10-CM | POA: Insufficient documentation

## 2019-06-07 DIAGNOSIS — Z7189 Other specified counseling: Secondary | ICD-10-CM

## 2019-06-07 DIAGNOSIS — N179 Acute kidney failure, unspecified: Secondary | ICD-10-CM

## 2019-06-07 DIAGNOSIS — C642 Malignant neoplasm of left kidney, except renal pelvis: Secondary | ICD-10-CM

## 2019-06-07 DIAGNOSIS — D631 Anemia in chronic kidney disease: Secondary | ICD-10-CM | POA: Insufficient documentation

## 2019-06-07 LAB — CBC WITH DIFFERENTIAL/PLATELET
Abs Immature Granulocytes: 0.02 10*3/uL (ref 0.00–0.07)
Basophils Absolute: 0 10*3/uL (ref 0.0–0.1)
Basophils Relative: 1 %
Eosinophils Absolute: 0.7 10*3/uL — ABNORMAL HIGH (ref 0.0–0.5)
Eosinophils Relative: 8 %
HCT: 41.1 % (ref 39.0–52.0)
Hemoglobin: 13.4 g/dL (ref 13.0–17.0)
Immature Granulocytes: 0 %
Lymphocytes Relative: 19 %
Lymphs Abs: 1.6 10*3/uL (ref 0.7–4.0)
MCH: 29 pg (ref 26.0–34.0)
MCHC: 32.6 g/dL (ref 30.0–36.0)
MCV: 89 fL (ref 80.0–100.0)
Monocytes Absolute: 0.9 10*3/uL (ref 0.1–1.0)
Monocytes Relative: 10 %
Neutro Abs: 5.6 10*3/uL (ref 1.7–7.7)
Neutrophils Relative %: 62 %
Platelets: 182 10*3/uL (ref 150–400)
RBC: 4.62 MIL/uL (ref 4.22–5.81)
RDW: 12.8 % (ref 11.5–15.5)
WBC: 8.8 10*3/uL (ref 4.0–10.5)
nRBC: 0 % (ref 0.0–0.2)

## 2019-06-07 LAB — COMPREHENSIVE METABOLIC PANEL
ALT: 26 U/L (ref 0–44)
AST: 27 U/L (ref 15–41)
Albumin: 4.1 g/dL (ref 3.5–5.0)
Alkaline Phosphatase: 101 U/L (ref 38–126)
Anion gap: 9 (ref 5–15)
BUN: 56 mg/dL — ABNORMAL HIGH (ref 6–20)
CO2: 27 mmol/L (ref 22–32)
Calcium: 9.5 mg/dL (ref 8.9–10.3)
Chloride: 101 mmol/L (ref 98–111)
Creatinine, Ser: 2.25 mg/dL — ABNORMAL HIGH (ref 0.61–1.24)
GFR calc Af Amer: 36 mL/min — ABNORMAL LOW (ref >60)
GFR calc non Af Amer: 31 mL/min — ABNORMAL LOW (ref >60)
Glucose, Bld: 129 mg/dL — ABNORMAL HIGH (ref 70–99)
Potassium: 4.6 mmol/L (ref 3.5–5.1)
Sodium: 137 mmol/L (ref 135–145)
Total Bilirubin: 0.6 mg/dL (ref 0.3–1.2)
Total Protein: 7.5 g/dL (ref 6.5–8.1)

## 2019-06-07 LAB — TSH: TSH: 3.083 u[IU]/mL (ref 0.350–4.500)

## 2019-06-07 MED ORDER — SODIUM CHLORIDE 0.9 % IV SOLN
200.0000 mg | Freq: Once | INTRAVENOUS | Status: AC
  Administered 2019-06-07: 200 mg via INTRAVENOUS
  Filled 2019-06-07: qty 8

## 2019-06-07 MED ORDER — SODIUM CHLORIDE 0.9 % IV SOLN
Freq: Once | INTRAVENOUS | Status: AC
  Filled 2019-06-07: qty 250

## 2019-06-07 MED ORDER — ONDANSETRON HCL 8 MG PO TABS: 8.0000 mg | ORAL_TABLET | Freq: Two times a day (BID) | ORAL | 1 refills | Status: DC | PRN

## 2019-06-07 NOTE — Progress Notes (Signed)
Per Benjamine Mola RN per Dr, Tasia Catchings proceed with [facility] treatment at this time, Pt to also receive 500cc NS over 1 hour. *see MAR*  0840:Consent for Pembrolizumab signed by patient with Ronnald Collum, interpreter, present.   1100: Pt tolerated infusion well. No s/s of distress noted. Pt stable at discharge.

## 2019-06-07 NOTE — Progress Notes (Signed)
Hematology/Oncology Progress Note [facility] Telephone:([phone] [phone]  Patient Care Team: Earlie Server, MD as PCP - General (Oncology)   Name of the patient: Mark Donaldson  [id number]  17-Jul-1960  Date of visit: 06/07/19   INTERVAL HISTORY-  59 y.o. male presents for follow-up of renal cell carcinoma treatments. Patient has past medical history CAD status post PCI to LAD in 2018, on aspirin and Plavix, CHF, history of focal sclerosing/crescentic GN-ANCA positive and CKD stage III. He was hospitalized from 05/02/2019-05/07/2019 due to gross hematuria. CT showed a 7.4 cm left lower pole renal mass concerning for RCC.  Patient also has necrotic lymphadenopathy in the mediastinum and the right supra hilar region which were highly suspicious for metastatic disease.  Bilateral lung nodules, also concerning for metastatic lung disease.  Right retrocrural lymphadenopathy with upper normal left periaortic lymph node. Patient was recommended for cytoreductive radical nephrectomy.  With history of ANCA positive vasculitis, also recommend thoracic lymphadenopathy biopsy to confirm distal metastasis.  Patient agreed to radical nephrectomy and declined bronchoscopy biopsy. He underwent left radical nephrectomy on 05/05/2019. Pathology showed pT3a pNx, RCC, conventional clear cell type, grade 3 Patient was discharged home. Today he presents to discuss pathology and the management plan. He was accompanied by his wife and daughter. Patient reports that he uses hydrocodone for pain.  Pain around the surgical site has improved.  He occasionally has tingling sensation around the surgical site.  Denies any fever or chills, drainage from the surgical site.  INTERVAL HISTORY Mark Donaldson is a 59 y.o. male who has above history reviewed by me today presents for follow up visit for management of metastatic RCC Problems and complaints are listed below:  Patient is Spanish-speaking.  Spanish interpreter present for the entire encounter for translation Patient reports that he has not received axitinib yet. He has been to chemotherapy education class. Today he reports no new complaints.  He denies any breathing difficulties. He reports being compliant with his medications.  He cannot remember what dose of Lasix he has been taking.  Review of systems- Review of Systems  Constitutional: Negative for appetite change, chills, fatigue, fever and unexpected weight change.  HENT:   Negative for hearing loss and voice change.   Eyes: Negative for eye problems and icterus.  Respiratory: Negative for chest tightness, cough and shortness of breath.   Cardiovascular: Negative for chest pain and leg swelling.  Gastrointestinal: Negative for abdominal distention and abdominal pain.  Endocrine: Negative for hot flashes.  Genitourinary: Negative for difficulty urinating, dysuria and frequency.   Musculoskeletal: Negative for arthralgias.  Skin: Negative for itching and rash.  Neurological: Negative for light-headedness and numbness.  Hematological: Negative for adenopathy. Does not bruise/bleed easily.  Psychiatric/Behavioral: Negative for confusion.    No Known Allergies  Patient Active Problem List   Diagnosis Date Noted  . Goals of care, counseling/discussion 05/23/2019  . Renal cell carcinoma of left kidney ([place]) 05/23/2019  . Renal cell carcinoma ([place]) 05/23/2019  . Palliative care encounter   . History of vasculitis   . Thoracic lymphadenopathy   . Lung nodule   . CAD S/P percutaneous coronary angioplasty 05/02/2019  . Kidney mass 05/02/2019  . Hydronephrosis of left kidney 05/02/2019  . UTI (urinary tract infection) 05/02/2019  . AKI (acute kidney injury) ([place]) 05/02/2019  . Gross hematuria 05/02/2019  . Chronic diastolic CHF (congestive heart failure) ([place]) 05/02/2019  . Sepsis ([place]) 05/02/2019  . Severe sepsis ([place]) 05/02/2019  .  Preop cardiovascular exam      Past Medical History:  Diagnosis Date  . 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 CAD (coronary artery disease)   . Essential hypertension   . HFrEF (heart failure with reduced ejection fraction) ([place])   . Hyperlipidemia LDL goal <70   . Ischemic cardiomyopathy   . Renal cell carcinoma ([place]) 05/23/2019  . Renal disorder      Past Surgical History:  Procedure Laterality Date  . CARDIAC SURGERY     cardiac cath with 2 stent placement  . LAPAROSCOPIC NEPHRECTOMY, HAND ASSISTED Left 05/05/2019   Procedure: HAND ASSISTED LAPAROSCOPIC NEPHRECTOMY;  Surgeon: Hollice Espy, MD;  Location: [facility] ORS;  Service: Urology;  Laterality: Left;    Social History   Socioeconomic History  . Marital status: Married    Spouse name: Not on file  . Number of children: Not on file  . Years of education: Not on file  . Highest education level: Not on file  Occupational History  . Not on file  Tobacco Use  . Smoking status: Never Smoker  . Smokeless tobacco: Never Used  Substance and Sexual Activity  . Alcohol use: Yes  . Drug use: Not on file  . Sexual activity: Not on file  Other Topics Concern  . Not on file  Social History Narrative  . Not on file   Social Determinants of Health   Financial Resource Strain:   . Difficulty of Paying Living Expenses:   Food Insecurity:   . Worried About Charity fundraiser in the Last Year:   . Arboriculturist in the Last Year:   Transportation Needs:   . Film/video editor (Medical):   Marland Kitchen Lack of Transportation (Non-Medical):   Physical Activity:   . Days of Exercise per Week:   . Minutes of Exercise per Session:   Stress:   . Feeling of Stress :   Social Connections:   . Frequency of Communication with Friends and Family:   . Frequency of Social Gatherings with Friends and Family:   . Attends Religious Services:   . Active Member of Clubs or Organizations:   . Attends Archivist Meetings:   Marland Kitchen Marital  Status:   Intimate Partner Violence:   . Fear of Current or Ex-Partner:   . Emotionally Abused:   Marland Kitchen Physically Abused:   . Sexually Abused:      Family History  Problem Relation Age of Onset  . Cancer Mother   . Blindness Brother      Current Outpatient Medications:  .  aspirin 81 MG chewable tablet, Chew 1 tablet by mouth daily., Disp: , Rfl:  .  atorvastatin (LIPITOR) 80 MG tablet, Take 1 tablet by mouth at bedtime., Disp: , Rfl:  .  furosemide (LASIX) 20 MG tablet, Take 1 tablet (20 mg total) by mouth daily., Disp: 30 tablet, Rfl: 1 .  lisinopril (ZESTRIL) 10 MG tablet, Take 1 tablet by mouth daily., Disp: , Rfl:  .  metoprolol succinate (TOPROL-XL) 100 MG 24 hr tablet, Take 1.5 tablets by mouth daily., Disp: , Rfl:  .  ondansetron (ZOFRAN) 8 MG tablet, Take 1 tablet (8 mg total) by mouth 2 (two) times daily as needed (Nausea or vomiting)., Disp: 30 tablet, Rfl: 1 .  amLODipine (NORVASC) 10 MG tablet, Take 1 tablet (10 mg total) by mouth daily., Disp: 90 tablet, Rfl: 1 .  axitinib (INLYTA) 1 MG tablet, Take 5 tablets (5 mg total) by mouth 2 (  two) times daily. (Patient not taking: Reported on 06/07/2019), Disp: 140 tablet, Rfl: 0 .  clopidogrel (PLAVIX) 75 MG tablet, Take 1 tablet by mouth daily., Disp: , Rfl:  .  furosemide (LASIX) 40 MG tablet, Take 40 mg by mouth., Disp: , Rfl:  .  HYDROcodone-acetaminophen (NORCO/VICODIN) 5-325 MG tablet, Take 1 tablet by mouth every 6 (six) hours as needed for moderate pain., Disp: , Rfl:  .  senna-docusate (SENOKOT-S) 8.6-50 MG tablet, Take 1 tablet by mouth 2 (two) times daily between meals as needed for mild constipation. (Patient not taking: Reported on 06/07/2019), Disp: 20 tablet, Rfl: 0   Physical exam:  Vitals:   06/07/19 0834  BP: 100/65  Pulse: 76  Resp: 18  Temp: (!) 96.4 F (35.8 C)  TempSrc: Tympanic  Weight: 154 lb 8 oz (70.1 kg)   Physical Exam  Constitutional: He is oriented to person, place, and time. No distress.   HENT:  Head: Normocephalic and atraumatic.  Nose: Nose normal.  Mouth/Throat: Oropharynx is clear and moist. No oropharyngeal exudate.  Eyes: Pupils are equal, round, and reactive to light. EOM are normal. No scleral icterus.  Cardiovascular: Normal rate and regular rhythm.  No murmur heard. Pulmonary/Chest: Effort normal. No respiratory distress. He has no rales. He exhibits no tenderness.  Abdominal: Soft. He exhibits no distension. There is no abdominal tenderness.  Musculoskeletal:        General: No edema. Normal range of motion.     Cervical back: Normal range of motion and neck supple.  Neurological: He is alert and oriented to person, place, and time. No cranial nerve deficit. He exhibits normal muscle tone. Coordination normal.  Skin: Skin is warm and dry. He is not diaphoretic. No erythema.  Left radical nephrectomy surgical site heals well.  No erythema or drainage.  Psychiatric: Affect normal.       CMP Latest Ref Rng & Units 06/07/2019  Glucose 70 - 99 mg/dL 129(H)  BUN 6 - 20 mg/dL 56(H)  Creatinine 0.61 - 1.24 mg/dL 2.25(H)  Sodium 135 - 145 mmol/L 137  Potassium 3.5 - 5.1 mmol/L 4.6  Chloride 98 - 111 mmol/L 101  CO2 22 - 32 mmol/L 27  Calcium 8.9 - 10.3 mg/dL 9.5  Total Protein 6.5 - 8.1 g/dL 7.5  Total Bilirubin 0.3 - 1.2 mg/dL 0.6  Alkaline Phos 38 - 126 U/L 101  AST 15 - 41 U/L 27  ALT 0 - 44 U/L 26   CBC Latest Ref Rng & Units 06/07/2019  WBC 4.0 - 10.5 K/uL 8.8  Hemoglobin 13.0 - 17.0 g/dL 13.4  Hematocrit 39.0 - 52.0 % 41.1  Platelets 150 - 400 K/uL 182    RADIOGRAPHIC STUDIES: I have personally reviewed the radiological images as listed and agreed with the findings in the report. NM Bone Scan Whole Body  Result Date: 05/30/2019 CLINICAL DATA:  Renal cell carcinoma LEFT kidney, staging, LEFT nephrectomy, BILATERAL shoulder and upper back pain, sometimes pain in BILATERAL upper and lower extremities EXAM: NUCLEAR MEDICINE WHOLE BODY BONE SCAN  TECHNIQUE: Whole body anterior and posterior images were obtained approximately 3 hours after intravenous injection of radiopharmaceutical. RADIOPHARMACEUTICALS:  22.76 mCi Technetium-7m MDP IV COMPARISON:  None Correlation: CT chest abdomen pelvis 05/03/2019 FINDINGS: Uptake at multiple joints in the upper and lower extremities bilaterally typically degenerative. No worrisome sites of abnormal tracer localization are identified to suggest osseous metastatic disease. Prominent xiphoid process. Interval LEFT nephrectomy since CT scan. Urinary tract and soft tissue distribution  of tracer otherwise unremarkable. IMPRESSION: No scintigraphic evidence of osseous metastatic disease. Electronically Signed   By: Lavonia Dana M.D.   On: 05/30/2019 16:47    Assessment and plan-  Patient is a 59 y.o. male history of CAD, status post stent, CHF, history of ANCA positive sclerosing/crescentic GN, chronic kidney disease stage III presents for treatments of stage IV renal cell carcinoma 1. Renal cell carcinoma of left kidney (HCC)   2. Goals of care, counseling/discussion   3. Anemia in stage 3b chronic kidney disease   4. AKI (acute kidney injury) ([place])    Stage IV renal cell carcinoma Labs are reviewed and discussed with patient. Counts acceptable to proceed with cycle 1 Keytruda today. Patient will receive axitinib and advised patient to start with 5 mg twice daily  I have previously discussed with him about the rationale and potential side effects of axitinib along with Keytruda. Today patient has multiple questions and I spent time with him and review the goal of care, rationale of chemotherapy, potential side effects and details.. Patient voices understanding and willing to proceed treatments. Goal of care is palliative. MRI brain was ordered and patient has not get it scheduled yet.  We will schedule MRI brain.   #Further worsening of kidney function.  Unclear if this is going to be his new baseline  after nephrectomy, or overdiuresis with Lasix, or dehydration. Patient will receive 500 cc IV fluid today. Advised patient to bring medication/list to [place] for further clarification of his medication. Patient had a repeat echocardiogram on 05/04/2019 which showed normal LVEF at 60 to 65%. Patient was last seen by her cardiologist Dr. Caryl Comes in February 2021.  At that time he was recommended to continue spironolactone and furosemide  Supportive care measures are necessary for patient well-being and will be provided as necessary. We spent sufficient time to discuss many aspect of care, questions were answered to patient's satisfaction. Follow-up in 1 week. Thank you for allowing me to participate in the care of this patient.   Earlie Server, MD, PhD Hematology Oncology [facility] Pager- [phone] 06/07/2019

## 2019-06-07 NOTE — Progress Notes (Signed)
Pt here for new treatment Keytruda. He has not received Inlynta yet.

## 2019-06-07 NOTE — Telephone Encounter (Signed)
Spoke to patient at his appointment today and he states that he has not received the Inlyta yet.  I called [employer] to check delivery status and rep stated that medication is out for delivery today.

## 2019-06-08 ENCOUNTER — Telehealth: Payer: Self-pay

## 2019-06-08 LAB — T4: T4, Total: 7.3 ug/dL (ref 4.5–12.0)

## 2019-06-08 NOTE — Telephone Encounter (Signed)
Telephone call to patient for follow up after receiving first infusion using our interpreter Ronnald Collum.   Patient states infusion went great.  States eating good and drinking plenty of fluids.   Denies any nausea or vomiting.  Encouraged patient to call for any concerns or questions.

## 2019-06-15 ENCOUNTER — Encounter: Payer: Self-pay | Admitting: Oncology

## 2019-06-15 ENCOUNTER — Inpatient Hospital Stay: Payer: Self-pay | Attending: Oncology

## 2019-06-15 ENCOUNTER — Inpatient Hospital Stay: Payer: Self-pay

## 2019-06-15 ENCOUNTER — Inpatient Hospital Stay (HOSPITAL_BASED_OUTPATIENT_CLINIC_OR_DEPARTMENT_OTHER): Payer: Self-pay | Admitting: Oncology

## 2019-06-15 ENCOUNTER — Other Ambulatory Visit: Payer: Self-pay

## 2019-06-15 VITALS — BP 118/82 | HR 75 | Temp 96.8°F | Resp 18 | Wt 153.6 lb

## 2019-06-15 DIAGNOSIS — E785 Hyperlipidemia, unspecified: Secondary | ICD-10-CM | POA: Insufficient documentation

## 2019-06-15 DIAGNOSIS — N179 Acute kidney failure, unspecified: Secondary | ICD-10-CM

## 2019-06-15 DIAGNOSIS — I130 Hypertensive heart and chronic kidney disease with heart failure and stage 1 through stage 4 chronic kidney disease, or unspecified chronic kidney disease: Secondary | ICD-10-CM | POA: Insufficient documentation

## 2019-06-15 DIAGNOSIS — N1832 Chronic kidney disease, stage 3b: Secondary | ICD-10-CM

## 2019-06-15 DIAGNOSIS — Z905 Acquired absence of kidney: Secondary | ICD-10-CM | POA: Insufficient documentation

## 2019-06-15 DIAGNOSIS — D631 Anemia in chronic kidney disease: Secondary | ICD-10-CM

## 2019-06-15 DIAGNOSIS — G939 Disorder of brain, unspecified: Secondary | ICD-10-CM | POA: Insufficient documentation

## 2019-06-15 DIAGNOSIS — Z7982 Long term (current) use of aspirin: Secondary | ICD-10-CM | POA: Insufficient documentation

## 2019-06-15 DIAGNOSIS — Z955 Presence of coronary angioplasty implant and graft: Secondary | ICD-10-CM | POA: Insufficient documentation

## 2019-06-15 DIAGNOSIS — C7800 Secondary malignant neoplasm of unspecified lung: Secondary | ICD-10-CM | POA: Insufficient documentation

## 2019-06-15 DIAGNOSIS — I5042 Chronic combined systolic (congestive) and diastolic (congestive) heart failure: Secondary | ICD-10-CM | POA: Insufficient documentation

## 2019-06-15 DIAGNOSIS — C642 Malignant neoplasm of left kidney, except renal pelvis: Secondary | ICD-10-CM

## 2019-06-15 DIAGNOSIS — Z79899 Other long term (current) drug therapy: Secondary | ICD-10-CM | POA: Insufficient documentation

## 2019-06-15 DIAGNOSIS — I255 Ischemic cardiomyopathy: Secondary | ICD-10-CM | POA: Insufficient documentation

## 2019-06-15 DIAGNOSIS — Z5112 Encounter for antineoplastic immunotherapy: Secondary | ICD-10-CM | POA: Insufficient documentation

## 2019-06-15 DIAGNOSIS — I2510 Atherosclerotic heart disease of native coronary artery without angina pectoris: Secondary | ICD-10-CM | POA: Insufficient documentation

## 2019-06-15 DIAGNOSIS — N390 Urinary tract infection, site not specified: Secondary | ICD-10-CM | POA: Insufficient documentation

## 2019-06-15 DIAGNOSIS — C641 Malignant neoplasm of right kidney, except renal pelvis: Secondary | ICD-10-CM | POA: Insufficient documentation

## 2019-06-15 LAB — COMPREHENSIVE METABOLIC PANEL
ALT: 22 U/L (ref 0–44)
AST: 23 U/L (ref 15–41)
Albumin: 4.2 g/dL (ref 3.5–5.0)
Alkaline Phosphatase: 112 U/L (ref 38–126)
Anion gap: 9 (ref 5–15)
BUN: 46 mg/dL — ABNORMAL HIGH (ref 6–20)
CO2: 25 mmol/L (ref 22–32)
Calcium: 9.3 mg/dL (ref 8.9–10.3)
Chloride: 106 mmol/L (ref 98–111)
Creatinine, Ser: 1.99 mg/dL — ABNORMAL HIGH (ref 0.61–1.24)
GFR calc Af Amer: 42 mL/min — ABNORMAL LOW (ref >60)
GFR calc non Af Amer: 36 mL/min — ABNORMAL LOW (ref >60)
Glucose, Bld: 130 mg/dL — ABNORMAL HIGH (ref 70–99)
Potassium: 4.9 mmol/L (ref 3.5–5.1)
Sodium: 140 mmol/L (ref 135–145)
Total Bilirubin: 0.6 mg/dL (ref 0.3–1.2)
Total Protein: 7.7 g/dL (ref 6.5–8.1)

## 2019-06-15 LAB — CBC WITH DIFFERENTIAL/PLATELET
Abs Immature Granulocytes: 0.03 10*3/uL (ref 0.00–0.07)
Basophils Absolute: 0 10*3/uL (ref 0.0–0.1)
Basophils Relative: 1 %
Eosinophils Absolute: 0.3 10*3/uL (ref 0.0–0.5)
Eosinophils Relative: 3 %
HCT: 42.2 % (ref 39.0–52.0)
Hemoglobin: 14.5 g/dL (ref 13.0–17.0)
Immature Granulocytes: 0 %
Lymphocytes Relative: 19 %
Lymphs Abs: 1.7 10*3/uL (ref 0.7–4.0)
MCH: 29.5 pg (ref 26.0–34.0)
MCHC: 34.4 g/dL (ref 30.0–36.0)
MCV: 85.8 fL (ref 80.0–100.0)
Monocytes Absolute: 0.9 10*3/uL (ref 0.1–1.0)
Monocytes Relative: 10 %
Neutro Abs: 5.8 10*3/uL (ref 1.7–7.7)
Neutrophils Relative %: 67 %
Platelets: 204 10*3/uL (ref 150–400)
RBC: 4.92 MIL/uL (ref 4.22–5.81)
RDW: 12.4 % (ref 11.5–15.5)
WBC: 8.8 10*3/uL (ref 4.0–10.5)
nRBC: 0 % (ref 0.0–0.2)

## 2019-06-15 LAB — TSH: TSH: 2.08 u[IU]/mL (ref 0.350–4.500)

## 2019-06-15 NOTE — Progress Notes (Signed)
Patient here for follow up. Med rec done with patient medication bottles. No new concerns voice.

## 2019-06-15 NOTE — Progress Notes (Signed)
Hematology/Oncology Progress Note [facility] [phone] [phone]  Patient Care Team: Earlie Server, MD as PCP - General (Oncology)   Name of the patient: Mark Donaldson  [id number]  1960-03-25  Date of visit: 06/15/19   INTERVAL HISTORY-  59 y.o. male presents for follow-up of renal cell carcinoma treatments. Patient has past medical history CAD status post PCI to LAD in 2018, on aspirin and Plavix, CHF, history of focal sclerosing/crescentic GN-ANCA positive and CKD stage III. He was hospitalized from 05/02/2019-05/07/2019 due to gross hematuria. CT showed a 7.4 cm left lower pole renal mass concerning for RCC.  Patient also has necrotic lymphadenopathy in the mediastinum and the right supra hilar region which were highly suspicious for metastatic disease.  Bilateral lung nodules, also concerning for metastatic lung disease.  Right retrocrural lymphadenopathy with upper normal left periaortic lymph node. Patient was recommended for cytoreductive radical nephrectomy.  With history of ANCA positive vasculitis, also recommend thoracic lymphadenopathy biopsy to confirm distal metastasis.  Patient agreed to radical nephrectomy and declined bronchoscopy biopsy. He underwent left radical nephrectomy on 05/05/2019. Pathology showed pT3a pNx, RCC, conventional clear cell type, grade 3 Patient was discharged home. Today he presents to discuss pathology and the management plan. He was accompanied by his wife and daughter. Patient reports that he uses hydrocodone for pain.  Pain around the surgical site has improved.  He occasionally has tingling sensation around the surgical site.  Denies any fever or chills, drainage from the surgical site.  INTERVAL HISTORY Mark Donaldson is a 59 y.o. male who has above history reviewed by me today presents for follow up visit for management of metastatic RCC Problems and complaints are listed  below: Patient is Spanish-speaking.  Spanish interpreter present for the entire encounter for translation Patient started on Axitinib.  He tolerates well. He reports a small area of bruising on his left forearm.  Otherwise no new complaints.  He brought his medications. He has finished lasix 20mg  supply, has a bottle of lasix 40mg . He is not sure what dose he needs to be on.  Last see Dr.Bazemore [facility] cardiology in February 2021.     Review of systems- Review of Systems  Constitutional: Negative for appetite change, chills, fatigue, fever and unexpected weight change.  HENT:   Negative for hearing loss and voice change.   Eyes: Negative for eye problems and icterus.  Respiratory: Negative for chest tightness, cough and shortness of breath.   Cardiovascular: Negative for chest pain and leg swelling.  Gastrointestinal: Negative for abdominal distention and abdominal pain.  Endocrine: Negative for hot flashes.  Genitourinary: Negative for difficulty urinating, dysuria and frequency.   Musculoskeletal: Negative for arthralgias.  Skin: Negative for itching and rash.  Neurological: Negative for light-headedness and numbness.  Hematological: Negative for adenopathy. Does not bruise/bleed easily.  Psychiatric/Behavioral: Negative for confusion.    No Known Allergies  Patient Active Problem List   Diagnosis Date Noted  . Anemia in stage 3b chronic kidney disease 06/07/2019  . Goals of care, counseling/discussion 05/23/2019  . Renal cell carcinoma of left kidney ([place]) 05/23/2019  . Renal cell carcinoma ([place]) 05/23/2019  . Palliative care encounter   . History of vasculitis   . Thoracic lymphadenopathy   . Lung nodule   . CAD S/P percutaneous coronary angioplasty 05/02/2019  . Kidney mass 05/02/2019  . Hydronephrosis of left kidney 05/02/2019  . UTI (urinary tract infection) 05/02/2019  . AKI (acute kidney injury) ([place]) 05/02/2019  .  Gross hematuria 05/02/2019  . Chronic diastolic CHF  (congestive heart failure) ([place]) 05/02/2019  . Sepsis ([place]) 05/02/2019  . Severe sepsis ([place]) 05/02/2019  . Preop cardiovascular exam      Past Medical History:  Diagnosis Date  . ANCA-positive vasculitis ([place])   . CAD (coronary artery disease)   . Essential hypertension   . HFrEF (heart failure with reduced ejection fraction) ([place])   . Hyperlipidemia LDL goal <70   . Ischemic cardiomyopathy   . Renal cell carcinoma ([place]) 05/23/2019  . Renal disorder      Past Surgical History:  Procedure Laterality Date  . CARDIAC SURGERY     cardiac cath with 2 stent placement  . LAPAROSCOPIC NEPHRECTOMY, HAND ASSISTED Left 05/05/2019   Procedure: HAND ASSISTED LAPAROSCOPIC NEPHRECTOMY;  Surgeon: Hollice Espy, MD;  Location: [facility] ORS;  Service: Urology;  Laterality: Left;    Social History   Socioeconomic History  . Marital status: Married    Spouse name: Not on file  . Number of children: Not on file  . Years of education: Not on file  . Highest education level: Not on file  Occupational History  . Not on file  Tobacco Use  . Smoking status: Never Smoker  . Smokeless tobacco: Never Used  Substance and Sexual Activity  . Alcohol use: Yes  . Drug use: Not on file  . Sexual activity: Not on file  Other Topics Concern  . Not on file  Social History Narrative  . Not on file   Social Determinants of Health   Financial Resource Strain:   . Difficulty of Paying Living Expenses:   Food Insecurity:   . Worried About Charity fundraiser in the Last Year:   . Arboriculturist in the Last Year:   Transportation Needs:   . Film/video editor (Medical):   Marland Kitchen Lack of Transportation (Non-Medical):   Physical Activity:   . Days of Exercise per Week:   . Minutes of Exercise per Session:   Stress:   . Feeling of Stress :   Social Connections:   . Frequency of Communication with Friends and Family:   . Frequency of Social Gatherings with Friends and Family:   . Attends Religious  Services:   . Active Member of Clubs or Organizations:   . Attends Archivist Meetings:   Marland Kitchen Marital Status:   Intimate Partner Violence:   . Fear of Current or Ex-Partner:   . Emotionally Abused:   Marland Kitchen Physically Abused:   . Sexually Abused:      Family History  Problem Relation Age of Onset  . Cancer Mother   . Blindness Brother      Current Outpatient Medications:  .  amLODipine (NORVASC) 10 MG tablet, Take 1 tablet (10 mg total) by mouth daily., Disp: 90 tablet, Rfl: 1 .  aspirin 81 MG chewable tablet, Chew 1 tablet by mouth daily., Disp: , Rfl:  .  atorvastatin (LIPITOR) 80 MG tablet, Take 1 tablet by mouth at bedtime., Disp: , Rfl:  .  axitinib (INLYTA) 1 MG tablet, Take 5 tablets (5 mg total) by mouth 2 (two) times daily., Disp: 140 tablet, Rfl: 0 .  clopidogrel (PLAVIX) 75 MG tablet, Take 1 tablet by mouth daily., Disp: , Rfl:  .  furosemide (LASIX) 40 MG tablet, Take 40 mg by mouth., Disp: , Rfl:  .  lisinopril (ZESTRIL) 10 MG tablet, Take 1 tablet by mouth daily., Disp: , Rfl:  .  metoprolol succinate (TOPROL-XL) 100 MG 24 hr tablet, Take 1.5 tablets by mouth daily., Disp: , Rfl:  .  ondansetron (ZOFRAN) 8 MG tablet, Take 1 tablet (8 mg total) by mouth 2 (two) times daily as needed (Nausea or vomiting)., Disp: 30 tablet, Rfl: 1 .  senna-docusate (SENOKOT-S) 8.6-50 MG tablet, Take 1 tablet by mouth 2 (two) times daily between meals as needed for mild constipation., Disp: 20 tablet, Rfl: 0 .  HYDROcodone-acetaminophen (NORCO/VICODIN) 5-325 MG tablet, Take 1 tablet by mouth every 6 (six) hours as needed for moderate pain., Disp: , Rfl:    Physical exam:  Vitals:   06/15/19 1329  BP: 118/82  Pulse: 75  Resp: 18  Temp: (!) 96.8 F (36 C)  Weight: 153 lb 9.6 oz (69.7 kg)   Physical Exam  Constitutional: He is oriented to person, place, and time. No distress.  HENT:  Head: Normocephalic and atraumatic.  Nose: Nose normal.  Mouth/Throat: Oropharynx is clear and  moist. No oropharyngeal exudate.  Eyes: Pupils are equal, round, and reactive to light. EOM are normal. No scleral icterus.  Cardiovascular: Normal rate and regular rhythm.  No murmur heard. Pulmonary/Chest: Effort normal. No respiratory distress. He has no rales. He exhibits no tenderness.  Abdominal: Soft. He exhibits no distension. There is no abdominal tenderness.  Musculoskeletal:        General: No edema. Normal range of motion.     Cervical back: Normal range of motion and neck supple.  Neurological: He is alert and oriented to person, place, and time. No cranial nerve deficit. He exhibits normal muscle tone. Coordination normal.  Skin: Skin is warm and dry. He is not diaphoretic. No erythema.  Psychiatric: Affect normal.       CMP Latest Ref Rng & Units 06/15/2019  Glucose 70 - 99 mg/dL 130(H)  BUN 6 - 20 mg/dL 46(H)  Creatinine 0.61 - 1.24 mg/dL 1.99(H)  Sodium 135 - 145 mmol/L 140  Potassium 3.5 - 5.1 mmol/L 4.9  Chloride 98 - 111 mmol/L 106  CO2 22 - 32 mmol/L 25  Calcium 8.9 - 10.3 mg/dL 9.3  Total Protein 6.5 - 8.1 g/dL 7.7  Total Bilirubin 0.3 - 1.2 mg/dL 0.6  Alkaline Phos 38 - 126 U/L 112  AST 15 - 41 U/L 23  ALT 0 - 44 U/L 22   CBC Latest Ref Rng & Units 06/15/2019  WBC 4.0 - 10.5 K/uL 8.8  Hemoglobin 13.0 - 17.0 g/dL 14.5  Hematocrit 39.0 - 52.0 % 42.2  Platelets 150 - 400 K/uL 204    RADIOGRAPHIC STUDIES: I have personally reviewed the radiological images as listed and agreed with the findings in the report. NM Bone Scan Whole Body  Result Date: 05/30/2019 CLINICAL DATA:  Renal cell carcinoma LEFT kidney, staging, LEFT nephrectomy, BILATERAL shoulder and upper back pain, sometimes pain in BILATERAL upper and lower extremities EXAM: NUCLEAR MEDICINE WHOLE BODY BONE SCAN TECHNIQUE: Whole body anterior and posterior images were obtained approximately 3 hours after intravenous injection of radiopharmaceutical. RADIOPHARMACEUTICALS:  22.76 mCi Technetium-61m MDP IV  COMPARISON:  None Correlation: CT chest abdomen pelvis 05/03/2019 FINDINGS: Uptake at multiple joints in the upper and lower extremities bilaterally typically degenerative. No worrisome sites of abnormal tracer localization are identified to suggest osseous metastatic disease. Prominent xiphoid process. Interval LEFT nephrectomy since CT scan. Urinary tract and soft tissue distribution of tracer otherwise unremarkable. IMPRESSION: No scintigraphic evidence of osseous metastatic disease. Electronically Signed   By: Crist Infante.D.  On: 05/30/2019 16:47    Assessment and plan-  Patient is a 59 y.o. male history of CAD, status post stent, CHF, history of ANCA positive sclerosing/crescentic GN, chronic kidney disease stage III presents for treatments of stage IV renal cell carcinoma 1. Renal cell carcinoma of left kidney (HCC)   2. Anemia in stage 3b chronic kidney disease   3. AKI (acute kidney injury) ([place])    Stage IV renal cell carcinoma Labs are reviewed and discussed with patient. Status post cycle 1 Keytruda  Labs reviewed and discussed with patient. Continue axitinib 5 mg twice daily. Blood pressures are stable. Advised patient to monitor his blood pressure at least twice a week. MRI brain pending.  #AKI on CKD, Creatinine has improved compared to last visit. Advised oral hydration. Advised patient to call cardiologist office and clarify his Lasix dose.  Supportive care measures are necessary for patient well-being and will be provided as necessary. We spent sufficient time to discuss many aspect of care, questions were answered to patient's satisfaction. Follow-up in 2 weeks. Thank you for allowing me to participate in the care of this patient.   Earlie Server, MD, PhD Hematology Oncology [facility] at [facility] Pager- [phone] 06/15/2019

## 2019-06-16 ENCOUNTER — Other Ambulatory Visit: Payer: Self-pay

## 2019-06-16 DIAGNOSIS — C642 Malignant neoplasm of left kidney, except renal pelvis: Secondary | ICD-10-CM

## 2019-06-22 ENCOUNTER — Other Ambulatory Visit: Payer: Self-pay

## 2019-06-22 ENCOUNTER — Ambulatory Visit
Admission: RE | Admit: 2019-06-22 | Discharge: 2019-06-22 | Disposition: A | Discharge status: Home / Self Care | Payer: Self-pay | Source: Ambulatory Visit | Attending: Oncology | Admitting: Oncology

## 2019-06-22 DIAGNOSIS — C642 Malignant neoplasm of left kidney, except renal pelvis: Secondary | ICD-10-CM | POA: Insufficient documentation

## 2019-06-22 MED ORDER — GADOBUTROL 1 MMOL/ML IV SOLN
7.0000 mL | Freq: Once | INTRAVENOUS | Status: AC | PRN
  Administered 2019-06-22: 7 mL via INTRAVENOUS

## 2019-06-23 ENCOUNTER — Telehealth: Payer: Self-pay | Admitting: *Deleted

## 2019-06-23 ENCOUNTER — Other Ambulatory Visit: Payer: Self-pay | Admitting: Oncology

## 2019-06-23 DIAGNOSIS — G939 Disorder of brain, unspecified: Secondary | ICD-10-CM

## 2019-06-23 NOTE — Telephone Encounter (Signed)
Called report  Positive for brain lesions

## 2019-06-24 ENCOUNTER — Telehealth: Payer: Self-pay

## 2019-06-24 NOTE — Telephone Encounter (Signed)
CT has been moved up to 6/14 @ 8am.  Patient has been notified of CT appt.

## 2019-06-24 NOTE — Telephone Encounter (Signed)
Please schedule.  I will ask Benjamine Mola to call with appt details.

## 2019-06-24 NOTE — Telephone Encounter (Signed)
-----   Message from Earlie Server, MD sent at 06/23/2019 10:34 PM EDT ----- Please arrange her to do CT head wo contrast per radiology recommendation for further eval. Please explain to him thanks.

## 2019-06-24 NOTE — Telephone Encounter (Addendum)
Done...(Interpreter Requested)  CT has been scheduled as requested.  06/27/19 @ 8:30am must arrive by 8:00 @ [place] [address]  [id number] [place] [address] [phone]

## 2019-06-27 ENCOUNTER — Other Ambulatory Visit: Payer: Self-pay

## 2019-06-27 ENCOUNTER — Ambulatory Visit
Admission: RE | Admit: 2019-06-27 | Discharge: 2019-06-27 | Disposition: A | Discharge status: Home / Self Care | Payer: Self-pay | Source: Ambulatory Visit | Attending: Oncology | Admitting: Oncology

## 2019-06-27 DIAGNOSIS — G939 Disorder of brain, unspecified: Secondary | ICD-10-CM | POA: Insufficient documentation

## 2019-06-28 ENCOUNTER — Inpatient Hospital Stay: Payer: Self-pay

## 2019-06-28 ENCOUNTER — Encounter: Payer: Self-pay | Admitting: Oncology

## 2019-06-28 ENCOUNTER — Other Ambulatory Visit: Payer: Self-pay

## 2019-06-28 ENCOUNTER — Telehealth: Payer: Self-pay

## 2019-06-28 ENCOUNTER — Inpatient Hospital Stay (HOSPITAL_BASED_OUTPATIENT_CLINIC_OR_DEPARTMENT_OTHER): Payer: Self-pay | Admitting: Oncology

## 2019-06-28 VITALS — BP 124/86 | HR 66 | Temp 96.4°F | Resp 18 | Wt 158.5 lb

## 2019-06-28 DIAGNOSIS — C642 Malignant neoplasm of left kidney, except renal pelvis: Secondary | ICD-10-CM

## 2019-06-28 DIAGNOSIS — Z5111 Encounter for antineoplastic chemotherapy: Secondary | ICD-10-CM

## 2019-06-28 DIAGNOSIS — N1832 Chronic kidney disease, stage 3b: Secondary | ICD-10-CM

## 2019-06-28 DIAGNOSIS — D631 Anemia in chronic kidney disease: Secondary | ICD-10-CM

## 2019-06-28 DIAGNOSIS — G939 Disorder of brain, unspecified: Secondary | ICD-10-CM

## 2019-06-28 DIAGNOSIS — Z5112 Encounter for antineoplastic immunotherapy: Secondary | ICD-10-CM

## 2019-06-28 LAB — CBC WITH DIFFERENTIAL/PLATELET
Abs Immature Granulocytes: 0.02 10*3/uL (ref 0.00–0.07)
Basophils Absolute: 0 10*3/uL (ref 0.0–0.1)
Basophils Relative: 1 %
Eosinophils Absolute: 0.3 10*3/uL (ref 0.0–0.5)
Eosinophils Relative: 4 %
HCT: 41.1 % (ref 39.0–52.0)
Hemoglobin: 14.3 g/dL (ref 13.0–17.0)
Immature Granulocytes: 0 %
Lymphocytes Relative: 20 %
Lymphs Abs: 1.7 10*3/uL (ref 0.7–4.0)
MCH: 28.9 pg (ref 26.0–34.0)
MCHC: 34.8 g/dL (ref 30.0–36.0)
MCV: 83.2 fL (ref 80.0–100.0)
Monocytes Absolute: 1 10*3/uL (ref 0.1–1.0)
Monocytes Relative: 12 %
Neutro Abs: 5.5 10*3/uL (ref 1.7–7.7)
Neutrophils Relative %: 63 %
Platelets: 170 10*3/uL (ref 150–400)
RBC: 4.94 MIL/uL (ref 4.22–5.81)
RDW: 12.1 % (ref 11.5–15.5)
WBC: 8.6 10*3/uL (ref 4.0–10.5)
nRBC: 0 % (ref 0.0–0.2)

## 2019-06-28 LAB — COMPREHENSIVE METABOLIC PANEL
ALT: 21 U/L (ref 0–44)
AST: 24 U/L (ref 15–41)
Albumin: 3.8 g/dL (ref 3.5–5.0)
Alkaline Phosphatase: 110 U/L (ref 38–126)
Anion gap: 8 (ref 5–15)
BUN: 34 mg/dL — ABNORMAL HIGH (ref 6–20)
CO2: 24 mmol/L (ref 22–32)
Calcium: 8.7 mg/dL — ABNORMAL LOW (ref 8.9–10.3)
Chloride: 104 mmol/L (ref 98–111)
Creatinine, Ser: 1.86 mg/dL — ABNORMAL HIGH (ref 0.61–1.24)
GFR calc Af Amer: 46 mL/min — ABNORMAL LOW (ref >60)
GFR calc non Af Amer: 39 mL/min — ABNORMAL LOW (ref >60)
Glucose, Bld: 102 mg/dL — ABNORMAL HIGH (ref 70–99)
Potassium: 5.1 mmol/L (ref 3.5–5.1)
Sodium: 136 mmol/L (ref 135–145)
Total Bilirubin: 0.7 mg/dL (ref 0.3–1.2)
Total Protein: 7.1 g/dL (ref 6.5–8.1)

## 2019-06-28 LAB — TSH: TSH: 3.858 u[IU]/mL (ref 0.350–4.500)

## 2019-06-28 MED ORDER — AXITINIB 5 MG PO TABS
5.0000 mg | ORAL_TABLET | Freq: Two times a day (BID) | ORAL | 0 refills | Status: DC
Start: 2019-06-28 — End: 2019-10-04

## 2019-06-28 MED ORDER — SODIUM CHLORIDE 0.9 % IV SOLN
200.0000 mg | Freq: Once | INTRAVENOUS | Status: AC
  Administered 2019-06-28: 200 mg via INTRAVENOUS
  Filled 2019-06-28: qty 8

## 2019-06-28 MED ORDER — SODIUM CHLORIDE 0.9 % IV SOLN
Freq: Once | INTRAVENOUS | Status: AC
  Filled 2019-06-28: qty 250

## 2019-06-28 NOTE — Progress Notes (Signed)
Pt here for follow up. Pt reports small spots to arm and leg.

## 2019-06-28 NOTE — Telephone Encounter (Signed)
Infectious disease referral entered.

## 2019-06-28 NOTE — Telephone Encounter (Signed)
-----   Message from Earlie Server, MD sent at 06/27/2019  4:30 PM EDT ----- Please let him know that his MRI showed possible parasite infection vs brain metastatic disease. Please refer him to infectious disease Dr.Ravishankar

## 2019-06-28 NOTE — Progress Notes (Signed)
Hematology/Oncology Progress Note [facility] Telephone:([phone] [phone]  Patient Care Team: Earlie Server, MD as PCP - General (Oncology)   Name of the patient: Mark Donaldson  [id number]  1960/08/27  Date of visit: 06/28/19   INTERVAL HISTORY-  59 y.o. male presents for follow-up of renal cell carcinoma treatments. Patient has past medical history CAD status post PCI to LAD in 2018, on aspirin and Plavix, CHF, history of focal sclerosing/crescentic GN-ANCA positive and CKD stage III. He was hospitalized from 05/02/2019-05/07/2019 due to gross hematuria. CT showed a 7.4 cm left lower pole renal mass concerning for RCC.  Patient also has necrotic lymphadenopathy in the mediastinum and the right supra hilar region which were highly suspicious for metastatic disease.  Bilateral lung nodules, also concerning for metastatic lung disease.  Right retrocrural lymphadenopathy with upper normal left periaortic lymph node. Patient was recommended for cytoreductive radical nephrectomy.  With history of ANCA positive vasculitis, also recommend thoracic lymphadenopathy biopsy to confirm distal metastasis.  Patient agreed to radical nephrectomy and declined bronchoscopy biopsy. He underwent left radical nephrectomy on 05/05/2019. Pathology showed pT3a pNx, RCC, conventional clear cell type, grade 3 Patient was discharged home. Today he presents to discuss pathology and the management plan. He was accompanied by his wife and daughter. Patient reports that he uses hydrocodone for pain.  Pain around the surgical site has improved.  He occasionally has tingling sensation around the surgical site.  Denies any fever or chills, drainage from the surgical site.  INTERVAL HISTORY Mark Donaldson is a 59 y.o. male who has above history reviewed by me today presents for follow up visit for management of metastatic RCC Problems and complaints are listed  below: Patient is Spanish-speaking.  Spanish interpreter present for the entire encounter for translation Patient started on Axitinib 5 mg twice daily. He monitors his blood pressure at home.  Denies any headache, nausea, vomiting, shortness of breath, abdominal pain. Denies any skin itchiness.  He has small discoloration spots on his skin.     Review of systems- Review of Systems  Constitutional: Negative for appetite change, chills, fatigue, fever and unexpected weight change.  HENT:   Negative for hearing loss and voice change.   Eyes: Negative for eye problems and icterus.  Respiratory: Negative for chest tightness, cough and shortness of breath.   Cardiovascular: Negative for chest pain and leg swelling.  Gastrointestinal: Negative for abdominal distention and abdominal pain.  Endocrine: Negative for hot flashes.  Genitourinary: Negative for difficulty urinating, dysuria and frequency.   Musculoskeletal: Negative for arthralgias.  Skin: Negative for itching and rash.  Neurological: Negative for light-headedness and numbness.  Hematological: Negative for adenopathy. Does not bruise/bleed easily.  Psychiatric/Behavioral: Negative for confusion.    No Known Allergies  Patient Active Problem List   Diagnosis Date Noted  . Anemia in stage 3b chronic kidney disease 06/07/2019  . Goals of care, counseling/discussion 05/23/2019  . Renal cell carcinoma of left kidney ([place]) 05/23/2019  . Renal cell carcinoma ([place]) 05/23/2019  . Palliative care encounter   . History of vasculitis   . Thoracic lymphadenopathy   . Lung nodule   . CAD S/P percutaneous coronary angioplasty 05/02/2019  . Kidney mass 05/02/2019  . Hydronephrosis of left kidney 05/02/2019  . UTI (urinary tract infection) 05/02/2019  . AKI (acute kidney injury) ([place]) 05/02/2019  . Gross hematuria 05/02/2019  . Chronic diastolic CHF (congestive heart failure) ([place]) 05/02/2019  . Sepsis ([place]) 05/02/2019  .  Severe sepsis  ([place]) 05/02/2019  . Preop cardiovascular exam      Past Medical History:  Diagnosis Date  . ANCA-positive vasculitis ([place])   . CAD (coronary artery disease)   . Essential hypertension   . HFrEF (heart failure with reduced ejection fraction) ([place])   . Hyperlipidemia LDL goal <70   . Ischemic cardiomyopathy   . Renal cell carcinoma ([place]) 05/23/2019  . Renal disorder      Past Surgical History:  Procedure Laterality Date  . CARDIAC SURGERY     cardiac cath with 2 stent placement  . LAPAROSCOPIC NEPHRECTOMY, HAND ASSISTED Left 05/05/2019   Procedure: HAND ASSISTED LAPAROSCOPIC NEPHRECTOMY;  Surgeon: Hollice Espy, MD;  Location: [facility] ORS;  Service: Urology;  Laterality: Left;    Social History   Socioeconomic History  . Marital status: Married    Spouse name: Not on file  . Number of children: Not on file  . Years of education: Not on file  . Highest education level: Not on file  Occupational History  . Not on file  Tobacco Use  . Smoking status: Never Smoker  . Smokeless tobacco: Never Used  Substance and Sexual Activity  . Alcohol use: Yes  . Drug use: Not on file  . Sexual activity: Not on file  Other Topics Concern  . Not on file  Social History Narrative  . Not on file   Social Determinants of Health   Financial Resource Strain:   . Difficulty of Paying Living Expenses:   Food Insecurity:   . Worried About Charity fundraiser in the Last Year:   . Arboriculturist in the Last Year:   Transportation Needs:   . Film/video editor (Medical):   Marland Kitchen Lack of Transportation (Non-Medical):   Physical Activity:   . Days of Exercise per Week:   . Minutes of Exercise per Session:   Stress:   . Feeling of Stress :   Social Connections:   . Frequency of Communication with Friends and Family:   . Frequency of Social Gatherings with Friends and Family:   . Attends Religious Services:   . Active Member of Clubs or Organizations:   . Attends Archivist  Meetings:   Marland Kitchen Marital Status:   Intimate Partner Violence:   . Fear of Current or Ex-Partner:   . Emotionally Abused:   Marland Kitchen Physically Abused:   . Sexually Abused:      Family History  Problem Relation Age of Onset  . Cancer Mother   . Blindness Brother      Current Outpatient Medications:  .  amLODipine (NORVASC) 10 MG tablet, Take 1 tablet (10 mg total) by mouth daily., Disp: 90 tablet, Rfl: 1 .  aspirin 81 MG chewable tablet, Chew 1 tablet by mouth daily., Disp: , Rfl:  .  atorvastatin (LIPITOR) 80 MG tablet, Take 1 tablet by mouth at bedtime., Disp: , Rfl:  .  axitinib (INLYTA) 1 MG tablet, Take 5 tablets (5 mg total) by mouth 2 (two) times daily., Disp: 140 tablet, Rfl: 0 .  clopidogrel (PLAVIX) 75 MG tablet, Take 1 tablet by mouth daily., Disp: , Rfl:  .  furosemide (LASIX) 20 MG tablet, Take 1 tablet by mouth every morning, Disp: , Rfl:  .  HYDROcodone-acetaminophen (NORCO/VICODIN) 5-325 MG tablet, Take 1 tablet by mouth every 6 (six) hours as needed for moderate pain., Disp: , Rfl:  .  lisinopril (ZESTRIL) 10 MG tablet, Take 1 tablet by mouth  daily., Disp: , Rfl:  .  metoprolol succinate (TOPROL-XL) 100 MG 24 hr tablet, Take 1.5 tablets by mouth daily., Disp: , Rfl:  .  ondansetron (ZOFRAN) 8 MG tablet, Take 1 tablet (8 mg total) by mouth 2 (two) times daily as needed (Nausea or vomiting)., Disp: 30 tablet, Rfl: 1 .  senna-docusate (SENOKOT-S) 8.6-50 MG tablet, Take 1 tablet by mouth 2 (two) times daily between meals as needed for mild constipation., Disp: 20 tablet, Rfl: 0   Physical exam:  Vitals:   06/28/19 0854  BP: 124/86  Pulse: 66  Resp: 18  Temp: (!) 96.4 F (35.8 C)  Weight: 158 lb 8 oz (71.9 kg)   Physical Exam Constitutional:      General: He is not in acute distress.    Appearance: He is not diaphoretic.  HENT:     Head: Normocephalic and atraumatic.     Nose: Nose normal.     Mouth/Throat:     Pharynx: No oropharyngeal exudate.  Eyes:     General:  No scleral icterus.    Pupils: Pupils are equal, round, and reactive to light.  Cardiovascular:     Rate and Rhythm: Normal rate and regular rhythm.     Heart sounds: No murmur heard.   Pulmonary:     Effort: Pulmonary effort is normal. No respiratory distress.     Breath sounds: No rales.  Chest:     Chest wall: No tenderness.  Abdominal:     General: There is no distension.     Palpations: Abdomen is soft.     Tenderness: There is no abdominal tenderness.  Musculoskeletal:        General: Normal range of motion.     Cervical back: Normal range of motion and neck supple.  Skin:    General: Skin is warm and dry.     Findings: No erythema.  Neurological:     Mental Status: He is alert and oriented to person, place, and time.     Cranial Nerves: No cranial nerve deficit.     Motor: No abnormal muscle tone.     Coordination: Coordination normal.  Psychiatric:        Mood and Affect: Affect normal.        CMP Latest Ref Rng & Units 06/28/2019  Glucose 70 - 99 mg/dL 102(H)  BUN 6 - 20 mg/dL 34(H)  Creatinine 0.61 - 1.24 mg/dL 1.86(H)  Sodium 135 - 145 mmol/L 136  Potassium 3.5 - 5.1 mmol/L 5.1  Chloride 98 - 111 mmol/L 104  CO2 22 - 32 mmol/L 24  Calcium 8.9 - 10.3 mg/dL 8.7(L)  Total Protein 6.5 - 8.1 g/dL 7.1  Total Bilirubin 0.3 - 1.2 mg/dL 0.7  Alkaline Phos 38 - 126 U/L 110  AST 15 - 41 U/L 24  ALT 0 - 44 U/L 21   CBC Latest Ref Rng & Units 06/28/2019  WBC 4.0 - 10.5 K/uL 8.6  Hemoglobin 13.0 - 17.0 g/dL 14.3  Hematocrit 39 - 52 % 41.1  Platelets 150 - 400 K/uL 170    RADIOGRAPHIC STUDIES: I have personally reviewed the radiological images as listed and agreed with the findings in the report. CT Head Wo Contrast  Result Date: 06/27/2019 CLINICAL DATA:  Metastatic renal cell carcinoma. Staging. Abnormal brain MRI. EXAM: CT HEAD WITHOUT CONTRAST TECHNIQUE: Contiguous axial images were obtained from the base of the skull through the vertex without intravenous  contrast. COMPARISON:  MRI 06/22/2019 FINDINGS: Brain: No abnormality  is seen affecting the brainstem or cerebellum. At the base of the brain on the left, there is focal asymmetric calcification associated with the enhancing lesion at the base of the brain. In the right posterior parietal region, the 2 cm cyst is not associated with visible calcification. In the right lateral parietal region, there is a 3 mm calcification associated with the 2 cm cystic lesion. In the left frontal region, there is focal calcification associated with the subcentimeter MRI lesion. 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 No hydrocephalus. No extra-axial fluid. No brain hemorrhage. No sign of ischemic infarction. Vascular: No abnormal vascular finding. Skull: Normal.  No lytic lesions. 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 Electronically Signed   By: Nelson Chimes M.D.   On: 06/27/2019 08:27   MR BRAIN W WO CONTRAST  Result Date: 06/23/2019 CLINICAL DATA:  Renal cell carcinoma metastatic to the lungs. Staging EXAM: MRI HEAD WITHOUT AND WITH CONTRAST TECHNIQUE: Multiplanar, multiecho pulse sequences of the brain and surrounding structures were obtained without and with intravenous contrast. CONTRAST:  50mL GADAVIST GADOBUTROL 1 MMOL/ML IV SOLN COMPARISON:  None. FINDINGS: Brain: Positive for enhancing brain lesions:: 1. Peripherally enhancing 5 mm nodule along the lower left basal ganglia. 2. Bilobed cystic lesion with enhancing mural nodule in the low right parietal lobe measuring 21 x 8 mm on 18:87. 3. Cyst and mural nodule lesion in the posterior right frontal lobe measuring up to 2 cm on 18:103. 4. 5 mm nodule along the cortex of the high left frontal lobe  on 18:110 Multiple areas of SWI hypointensity, much more numerous than the enhancing areas. Some of these a different appearance on the phase encoded series such that there may also be calcifications in addition to chronic blood products. Some the locations also appear to have a subarachnoid epicenter, and the left basal ganglia lesion is close to the brain surface. None the lesions show brain edema. No incidental infarct, hydrocephalus, or collection. Vascular: Normal flow voids and vascular enhancements Skull and upper cervical spine: Normal marrow signal Sinuses/Orbits: Negative Other: These results will be called to the ordering clinician or representative by the Radiologist Assistant, and communication documented in the PACS or [employer]. IMPRESSION: Positive for brain lesions which likely reflect metastatic disease in this setting, numbering 4 and measuring up to 2.1 cm. On gradient imaging there is suggestion of both mineralization and calcification. Suggest noncontrast CT to evaluate for calcifications (which would expanded differential to include neurocysticercosis if from an endemic area). Electronically Signed   By: Monte Fantasia M.D.   On: 06/23/2019 11:22   NM Bone Scan Whole Body  Result Date: 05/30/2019 CLINICAL DATA:  Renal cell carcinoma LEFT kidney, staging, LEFT nephrectomy, BILATERAL shoulder and upper back pain, sometimes pain in BILATERAL upper and lower extremities EXAM: NUCLEAR MEDICINE WHOLE BODY BONE SCAN TECHNIQUE: Whole body anterior and posterior images were obtained approximately 3 hours after intravenous injection of radiopharmaceutical. RADIOPHARMACEUTICALS:  22.76 mCi Technetium-22m MDP IV COMPARISON:  None Correlation: CT chest abdomen pelvis 05/03/2019 FINDINGS: Uptake at multiple joints in the upper and lower extremities bilaterally typically degenerative. No worrisome sites of abnormal tracer localization are identified to suggest osseous metastatic disease.  Prominent xiphoid process. Interval LEFT nephrectomy since CT scan. Urinary tract and soft tissue distribution of tracer otherwise  unremarkable. IMPRESSION: No scintigraphic evidence of osseous metastatic disease. Electronically Signed   By: Lavonia Dana M.D.   On: 05/30/2019 16:47    Assessment and plan-  Patient is a 59 y.o. male history of CAD, status post stent, CHF, history of ANCA positive sclerosing/crescentic GN, chronic kidney disease stage III presents for treatments of stage IV renal cell carcinoma 1. Brain lesion   2. Renal cell carcinoma of left kidney (HCC)   3. Anemia in stage 3b chronic kidney disease   4. Encounter for antineoplastic immunotherapy   5. Encounter for antineoplastic chemotherapy    Stage IV renal cell carcinoma Labs are reviewed and discussed with patient. Status post 1 cycle of Keytruda. Patient has been on axitinib 5 mg twice daily and he has tolerated very well.  Blood pressures stable Continue current regimen. Proceed with Keytruda today.  #Brain lesion found on staging MRI brain. Patient denies any symptoms currently.  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 Patient was born in [place] and came to [place] in 1990s.  I will refer to infectious disease for further evaluation.  Communicated with Dr. Steva Ready.  #Chronic kidney disease, creatinine has been stable.  Continue good oral hydration. Patient is on Lasix.  He has an appointment to follow-up with cardiology for clarification of Lasix dose. We spent sufficient time to discuss many aspect of care, questions were answered to patient's satisfaction. Follow-up in 3 weeks Thank you for allowing me to participate in the care of this patient.   Earlie Server, MD, PhD Hematology Oncology [facility] Pager- [phone] 06/28/2019

## 2019-06-30 ENCOUNTER — Ambulatory Visit: Payer: MEDICAID

## 2019-07-07 ENCOUNTER — Other Ambulatory Visit: Payer: Self-pay

## 2019-07-07 ENCOUNTER — Telehealth: Payer: Self-pay

## 2019-07-07 ENCOUNTER — Ambulatory Visit: Payer: Self-pay | Attending: Infectious Diseases | Admitting: Infectious Diseases

## 2019-07-07 ENCOUNTER — Encounter: Payer: Self-pay | Admitting: Infectious Diseases

## 2019-07-07 VITALS — BP 117/76 | HR 68 | Temp 98.4°F | Resp 17 | Ht 65.5 in | Wt 157.2 lb

## 2019-07-07 DIAGNOSIS — N189 Chronic kidney disease, unspecified: Secondary | ICD-10-CM | POA: Insufficient documentation

## 2019-07-07 DIAGNOSIS — Z7982 Long term (current) use of aspirin: Secondary | ICD-10-CM | POA: Insufficient documentation

## 2019-07-07 DIAGNOSIS — Z905 Acquired absence of kidney: Secondary | ICD-10-CM | POA: Insufficient documentation

## 2019-07-07 DIAGNOSIS — Z7902 Long term (current) use of antithrombotics/antiplatelets: Secondary | ICD-10-CM | POA: Insufficient documentation

## 2019-07-07 DIAGNOSIS — Z79899 Other long term (current) drug therapy: Secondary | ICD-10-CM | POA: Insufficient documentation

## 2019-07-07 DIAGNOSIS — E785 Hyperlipidemia, unspecified: Secondary | ICD-10-CM | POA: Insufficient documentation

## 2019-07-07 DIAGNOSIS — I2510 Atherosclerotic heart disease of native coronary artery without angina pectoris: Secondary | ICD-10-CM | POA: Insufficient documentation

## 2019-07-07 DIAGNOSIS — I130 Hypertensive heart and chronic kidney disease with heart failure and stage 1 through stage 4 chronic kidney disease, or unspecified chronic kidney disease: Secondary | ICD-10-CM | POA: Insufficient documentation

## 2019-07-07 DIAGNOSIS — C642 Malignant neoplasm of left kidney, except renal pelvis: Secondary | ICD-10-CM | POA: Insufficient documentation

## 2019-07-07 DIAGNOSIS — Z821 Family history of blindness and visual loss: Secondary | ICD-10-CM | POA: Insufficient documentation

## 2019-07-07 DIAGNOSIS — I5022 Chronic systolic (congestive) heart failure: Secondary | ICD-10-CM | POA: Insufficient documentation

## 2019-07-07 DIAGNOSIS — G969 Disorder of central nervous system, unspecified: Secondary | ICD-10-CM | POA: Insufficient documentation

## 2019-07-07 DIAGNOSIS — B690 Cysticercosis of central nervous system: Secondary | ICD-10-CM | POA: Insufficient documentation

## 2019-07-07 NOTE — Progress Notes (Signed)
NAME: Mark Donaldson  DOB: January 03, 1961  MRN: [id number]  Date/Time: 07/07/2019 9:40 AM  REQUESTING PROVIDER Dr. Talbert Cage Subjective:  REASON FOR CONSULT: Neurocysticercosis ?Spanish-speaking male.  Interviewed him with Spanish interpreter Mark Donaldson is a 59 y.o. male with a history of renal cancer, status post left nephrectomy is referred to me for abnormal findings on his MRI and CT head. 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  CT abdomen at that time showed a 7.4 cm left lower pole renal mass concerning for malignancy.  A CT chest showed necrotic lymph nodes in the right mediastinum along the pleural surface and right paratracheal space.  There was nodal conglomeration to the right of the trachea measuring 3.5 to 1.9 cm.  There was also a 1.7 cm perifissural nodule noted on the minor fissure on the right upper lobe.  The lymph nodes are suspicious for metastatic disease.  There was also a right retrocrural lymphadenopathy with upper normal left para-aortic lymph node suggestive of metastatic involvement as well there was avascular necrosis noted both femoral heads without collapse.  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 He was deemed to have stage IV renal Cell carcinoma with lung and thoracic lymphadenopathy .  Because of ANCA vasculitis there was a concern that the lymph node could have been due to that as well.  But patient declined biopsy of the mediastinal lymph node.  He is being seen by oncologist.  He had a 2D echo done on 05/04/2019 which showed an ejection fraction of 60 to 65%.  He is followed by cardiologist Dr. Caryl Comes..  Patient has been started on axitinib along with pembrolizumab.  He  got the first dose of pembrolizumab Beryle Flock) on 06/07/2019.  He underwent a staging MRI CT scan which revealed focal calcifications are the lesions suspicious of neurocysticercosis versus metastatic RCC lesions.  I am asked to see the patient for that.  Patient is originally from [place] he moved to the [place] in 1995.  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  He says he never went back to [place]. He lives with his wife. Has not had any headache or seizures. He is feeling better now. On questioning about his vision he said at one time couple of years ago he had some blurring of the right eye and saw an ophthalmologist and was told there was some bleeding internally which was not treated with any medication or surgery.  He says now he is left with a small floater like a brown spot.  Past Medical History:  Diagnosis Date  . 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 CAD (coronary artery disease)   . Essential hypertension   . HFrEF (heart failure with reduced ejection fraction) ([place])   . Hyperlipidemia LDL goal <70   . Ischemic cardiomyopathy   . Renal cell carcinoma ([place]) 05/23/2019  . Renal disorder     Past Surgical History:  Procedure Laterality Date  . CARDIAC SURGERY     cardiac cath with 2 stent placement  . LAPAROSCOPIC NEPHRECTOMY, HAND ASSISTED Left 05/05/2019   Procedure: HAND ASSISTED LAPAROSCOPIC NEPHRECTOMY;  Surgeon: Hollice Espy, MD;  Location: [facility] ORS;  Service: Urology;  Laterality: Left;    Social History   Socioeconomic History  . Marital  status: Married    Spouse name: Not on file  . Number of children: Not on file  . Years of education: Not on file  . Highest education level: Not on file  Occupational History  . Not on file  Tobacco Use  . Smoking status: Never Smoker  . Smokeless tobacco: Never Used  Substance and Sexual Activity  . Alcohol use: Yes  . Drug use: Not on file  . Sexual activity: Not on file  Other Topics Concern    . Not on file  Social History Narrative  . Not on file   Social Determinants of Health   Financial Resource Strain:   . Difficulty of Paying Living Expenses:   Food Insecurity:   . Worried About Charity fundraiser in the Last Year:   . Arboriculturist in the Last Year:   Transportation Needs:   . Film/video editor (Medical):   Marland Kitchen Lack of Transportation (Non-Medical):   Physical Activity:   . Days of Exercise per Week:   . Minutes of Exercise per Session:   Stress:   . Feeling of Stress :   Social Connections:   . Frequency of Communication with Friends and Family:   . Frequency of Social Gatherings with Friends and Family:   . Attends Religious Services:   . Active Member of Clubs or Organizations:   . Attends Archivist Meetings:   Marland Kitchen Marital Status:   Intimate Partner Violence:   . Fear of Current or Ex-Partner:   . Emotionally Abused:   Marland Kitchen Physically Abused:   . Sexually Abused:     Family History  Problem Relation Age of Onset  . Cancer Mother   . Blindness Brother    No Known Allergies  ? Current Outpatient Medications  Medication Sig Dispense Refill  . amLODipine (NORVASC) 10 MG tablet Take 1 tablet (10 mg total) by mouth daily. 90 tablet 1  . aspirin 81 MG chewable tablet Chew 1 tablet by mouth daily.    Marland Kitchen axitinib (INLYTA) 5 MG tablet Take 1 tablet (5 mg total) by mouth 2 (two) times daily. 60 tablet 0  . clopidogrel (PLAVIX) 75 MG tablet Take 1 tablet by mouth daily.    . furosemide (LASIX) 20 MG tablet Take 1 tablet by mouth every morning    . lisinopril (ZESTRIL) 10 MG tablet Take 1 tablet by mouth daily.    . metoprolol succinate (TOPROL-XL) 100 MG 24 hr tablet Take 1.5 tablets by mouth daily.     No current facility-administered medications for this visit.     Abtx:  Anti-infectives (From admission, onward)   None      REVIEW OF SYSTEMS:  Const: negative fever, negative chills, negative weight loss Eyes: negative diplopia or  visual changes, negative eye pain ENT: negative coryza, negative sore throat Resp: negative cough, hemoptysis, dyspnea Cards: negative for chest pain, palpitations, lower extremity edema GU: negative for frequency, dysuria and hematuria GI: Negative for abdominal pain, diarrhea, bleeding, constipation Skin: negative for rash and pruritus Heme: negative for easy bruising and gum/nose bleeding MS: negative for myalgias, arthralgias, back pain and muscle weakness Neurolo:negative for headaches, dizziness, vertigo, memory problems  Psych: negative for feelings of anxiety, depression  Endocrine: negative for thyroid, diabetes Allergy/Immunology- negative for any medication or food allergies  Objective:  VITALS:  BP 117/76   Pulse 68   Temp 98.4 F (36.9 C)   Resp 17   Ht 5' 5.5" (1.664 m)  Wt 157 lb 3.2 oz (71.3 kg)   SpO2 98%   BMI 25.76 kg/m  PHYSICAL EXAM:  General: Alert, cooperative, no distress, appears stated age.  Head: Normocephalic, without obvious abnormality, atraumatic. Eyes: Conjunctivae clear, anicteric sclerae. Pupils are equal ENT Nares normal. No drainage or sinus tenderness. Lips, mucosa, and tongue normal. No Thrush Neck: Supple, symmetrical, no adenopathy, thyroid: non tender no carotid bruit and no JVD. Back: No CVA tenderness. Lungs: Clear to auscultation bilaterally. No Wheezing or Rhonchi. No rales. Heart: Regular rate and rhythm, no murmur, rub or gallop. Abdomen: Soft, non-tender,not distended. Bowel sounds normal. No masses.  Lap scar healed well Extremities: atraumatic, no cyanosis. No edema. No clubbing Skin: No rashes or lesions. Or bruising Lymph: Cervical, supraclavicular normal. Neurologic: Grossly non-focal Pertinent Labs Lab Results CBC    Component Value Date/Time   WBC 8.6 06/28/2019 0824   RBC 4.94 06/28/2019 0824   HGB 14.3 06/28/2019 0824   HCT 41.1 06/28/2019 0824   PLT 170 06/28/2019 0824   MCV 83.2 06/28/2019 0824   MCH 28.9  06/28/2019 0824   MCHC 34.8 06/28/2019 0824   RDW 12.1 06/28/2019 0824   LYMPHSABS 1.7 06/28/2019 0824   MONOABS 1.0 06/28/2019 0824   EOSABS 0.3 06/28/2019 0824   BASOSABS 0.0 06/28/2019 0824    CMP Latest Ref Rng & Units 06/28/2019 06/15/2019 06/07/2019  Glucose 70 - 99 mg/dL 102(H) 130(H) 129(H)  BUN 6 - 20 mg/dL 34(H) 46(H) 56(H)  Creatinine 0.61 - 1.24 mg/dL 1.86(H) 1.99(H) 2.25(H)  Sodium 135 - 145 mmol/L 136 140 137  Potassium 3.5 - 5.1 mmol/L 5.1 4.9 4.6  Chloride 98 - 111 mmol/L 104 106 101  CO2 22 - 32 mmol/L 24 25 27   Calcium 8.9 - 10.3 mg/dL 8.7(L) 9.3 9.5  Total Protein 6.5 - 8.1 g/dL 7.1 7.7 7.5  Total Bilirubin 0.3 - 1.2 mg/dL 0.7 0.6 0.6  Alkaline Phos 38 - 126 U/L 110 112 101  AST 15 - 41 U/L 24 23 27   ALT 0 - 44 U/L 21 22 26       Microbiology: No results found for this or any previous visit (from the past 240 hour(s)).  IMAGING RESULTS:       MRI with contrast showed 4 lesions.  1 peripherally enhancing 5 mm nodule along the lower left basal ganglia.  2.  Bilobed cystic lesion with enhancing mural nodule in the low right parietal lobe measuring 2.1 and  8 mm.  #3 a cyst and mural nodule in light lesion in the posterior right frontal lobe measuring 2 cm.  4 of 5 mm nodule along the cortex on the high left frontal lobe.  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  ?  Impression/Recommendation ?59 year old male with recently diagnosed left renal cell carcinoma status post nephrectomy with mediastinal lymphadenopathy considered to be metastasis but never had a biopsy.  Referred to me because of CNS lesions which are looking like neurocysticercosis.  The differential diagnosis being cerebral mets. It is interesting to note the  patient is originally from [place] but since he moved to the [place] in 1995 he has not gone back to [place].  For the lesions to be still active and viable is a little unusual. 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   we will need to confirm the diagnosis  with blood test, serology of tinea Solium IgG done at [facility].  The enzyme linked immune transfer blot(EITB)  is only available at [facility] and this is a very specific and sensitive test.  I have contacted CDC and the test will have to be sent through the state department.   will contact them. Also will review the MRI with neuroradiologist and see whether it could be forwarded to [facility] experts on neurocysticercosis by [facility]. Patient will need an ophthalmological examination to rule out cysticercosis of the eyes including the choroid plexus and retina.  Because if treatment has to be given for cysticercosis there could be a risk for increasing   intraocular pressure.  An appointment has been made to see Dr. Edison Pace at the [facility] tomorrow.  Once the logistics have been arranged for blood work, at the same time we will also collect blood for strongyloidiasis and QuantiFERON gold as he would be on steroids as well as albendazole/praziquantel. Currently patient has no seizures and hence antiepileptic medication is not needed currently.   Metastatic renal carcinoma.  On pembrolizumab and axitinib. Status post left nephrectomy.  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  It is unclear whether he is followed by nephrologist.   Discussed the management with the patient in great detail through interpreter.  Spent nearly 60 minutes with the patient. We will contact the patient once arrangement is made for blood work  Note:  This document was prepared using Systems analyst and may include unintentional dictation errors.

## 2019-07-07 NOTE — Patient Instructions (Signed)
You need a referral to Ophthalmology for eye examination to r/o cysticercosis of the eyes  I have contacted CDC to see where we can send your labs Once we get the labs and results we can treat you accordingly.

## 2019-07-07 NOTE — Telephone Encounter (Signed)
Pt came to cancer center with concerns that he had not received his Inlyta and he only had enough medication for 1 day. Medication refill request was sent on 6/15. I contacted [facility] and they confirmed receipt of request dated 6/15, they transfered me to [facility] pt assistance. I spoke to [employer] pt assistance and notified her that pt has not received medication yet. She stated that she would ship medication for next day delivery and he should receive tomorrow 6/25. Pt has been notified and states he will wait for medicaiton. Advised him to contact us if he has not received by Monday.

## 2019-07-19 ENCOUNTER — Other Ambulatory Visit: Payer: Self-pay

## 2019-07-19 ENCOUNTER — Encounter: Payer: Self-pay | Admitting: Oncology

## 2019-07-19 ENCOUNTER — Inpatient Hospital Stay: Payer: Self-pay

## 2019-07-19 ENCOUNTER — Inpatient Hospital Stay: Payer: Self-pay | Attending: Oncology

## 2019-07-19 ENCOUNTER — Inpatient Hospital Stay (HOSPITAL_BASED_OUTPATIENT_CLINIC_OR_DEPARTMENT_OTHER): Payer: Self-pay | Admitting: Oncology

## 2019-07-19 VITALS — BP 126/96 | HR 85 | Temp 96.1°F | Resp 18 | Wt 160.0 lb

## 2019-07-19 DIAGNOSIS — D631 Anemia in chronic kidney disease: Secondary | ICD-10-CM | POA: Insufficient documentation

## 2019-07-19 DIAGNOSIS — C771 Secondary and unspecified malignant neoplasm of intrathoracic lymph nodes: Secondary | ICD-10-CM | POA: Insufficient documentation

## 2019-07-19 DIAGNOSIS — E785 Hyperlipidemia, unspecified: Secondary | ICD-10-CM | POA: Insufficient documentation

## 2019-07-19 DIAGNOSIS — G939 Disorder of brain, unspecified: Secondary | ICD-10-CM

## 2019-07-19 DIAGNOSIS — R197 Diarrhea, unspecified: Secondary | ICD-10-CM | POA: Insufficient documentation

## 2019-07-19 DIAGNOSIS — C7800 Secondary malignant neoplasm of unspecified lung: Secondary | ICD-10-CM | POA: Insufficient documentation

## 2019-07-19 DIAGNOSIS — Z905 Acquired absence of kidney: Secondary | ICD-10-CM | POA: Insufficient documentation

## 2019-07-19 DIAGNOSIS — C642 Malignant neoplasm of left kidney, except renal pelvis: Secondary | ICD-10-CM

## 2019-07-19 DIAGNOSIS — Z7982 Long term (current) use of aspirin: Secondary | ICD-10-CM | POA: Insufficient documentation

## 2019-07-19 DIAGNOSIS — N1832 Chronic kidney disease, stage 3b: Secondary | ICD-10-CM | POA: Insufficient documentation

## 2019-07-19 DIAGNOSIS — Z79899 Other long term (current) drug therapy: Secondary | ICD-10-CM | POA: Insufficient documentation

## 2019-07-19 DIAGNOSIS — I255 Ischemic cardiomyopathy: Secondary | ICD-10-CM | POA: Insufficient documentation

## 2019-07-19 DIAGNOSIS — Z5112 Encounter for antineoplastic immunotherapy: Secondary | ICD-10-CM | POA: Insufficient documentation

## 2019-07-19 DIAGNOSIS — I2510 Atherosclerotic heart disease of native coronary artery without angina pectoris: Secondary | ICD-10-CM | POA: Insufficient documentation

## 2019-07-19 DIAGNOSIS — N179 Acute kidney failure, unspecified: Secondary | ICD-10-CM

## 2019-07-19 DIAGNOSIS — Z5111 Encounter for antineoplastic chemotherapy: Secondary | ICD-10-CM | POA: Insufficient documentation

## 2019-07-19 DIAGNOSIS — I5042 Chronic combined systolic (congestive) and diastolic (congestive) heart failure: Secondary | ICD-10-CM | POA: Insufficient documentation

## 2019-07-19 DIAGNOSIS — I130 Hypertensive heart and chronic kidney disease with heart failure and stage 1 through stage 4 chronic kidney disease, or unspecified chronic kidney disease: Secondary | ICD-10-CM | POA: Insufficient documentation

## 2019-07-19 DIAGNOSIS — Z955 Presence of coronary angioplasty implant and graft: Secondary | ICD-10-CM | POA: Insufficient documentation

## 2019-07-19 LAB — CBC WITH DIFFERENTIAL/PLATELET
Abs Immature Granulocytes: 0.03 10*3/uL (ref 0.00–0.07)
Basophils Absolute: 0 10*3/uL (ref 0.0–0.1)
Basophils Relative: 0 %
Eosinophils Absolute: 0.4 10*3/uL (ref 0.0–0.5)
Eosinophils Relative: 4 %
HCT: 41 % (ref 39.0–52.0)
Hemoglobin: 14.6 g/dL (ref 13.0–17.0)
Immature Granulocytes: 0 %
Lymphocytes Relative: 17 %
Lymphs Abs: 1.4 10*3/uL (ref 0.7–4.0)
MCH: 29.2 pg (ref 26.0–34.0)
MCHC: 35.6 g/dL (ref 30.0–36.0)
MCV: 82 fL (ref 80.0–100.0)
Monocytes Absolute: 0.9 10*3/uL (ref 0.1–1.0)
Monocytes Relative: 11 %
Neutro Abs: 5.3 10*3/uL (ref 1.7–7.7)
Neutrophils Relative %: 68 %
Platelets: 163 10*3/uL (ref 150–400)
RBC: 5 MIL/uL (ref 4.22–5.81)
RDW: 12.7 % (ref 11.5–15.5)
WBC: 8 10*3/uL (ref 4.0–10.5)
nRBC: 0 % (ref 0.0–0.2)

## 2019-07-19 LAB — COMPREHENSIVE METABOLIC PANEL
ALT: 21 U/L (ref 0–44)
AST: 22 U/L (ref 15–41)
Albumin: 3.7 g/dL (ref 3.5–5.0)
Alkaline Phosphatase: 116 U/L (ref 38–126)
Anion gap: 7 (ref 5–15)
BUN: 41 mg/dL — ABNORMAL HIGH (ref 6–20)
CO2: 24 mmol/L (ref 22–32)
Calcium: 8.9 mg/dL (ref 8.9–10.3)
Chloride: 106 mmol/L (ref 98–111)
Creatinine, Ser: 2.13 mg/dL — ABNORMAL HIGH (ref 0.61–1.24)
GFR calc Af Amer: 39 mL/min — ABNORMAL LOW (ref >60)
GFR calc non Af Amer: 33 mL/min — ABNORMAL LOW (ref >60)
Glucose, Bld: 109 mg/dL — ABNORMAL HIGH (ref 70–99)
Potassium: 4.9 mmol/L (ref 3.5–5.1)
Sodium: 137 mmol/L (ref 135–145)
Total Bilirubin: 0.6 mg/dL (ref 0.3–1.2)
Total Protein: 7.2 g/dL (ref 6.5–8.1)

## 2019-07-19 LAB — TSH: TSH: 3.082 u[IU]/mL (ref 0.350–4.500)

## 2019-07-19 MED ORDER — MELATONIN 5 MG PO TABS: 5.0000 mg | ORAL_TABLET | Freq: Every evening | ORAL | 0 refills | Status: DC | PRN

## 2019-07-19 MED ORDER — SODIUM CHLORIDE 0.9 % IV SOLN
Freq: Once | INTRAVENOUS | Status: AC
  Filled 2019-07-19: qty 250

## 2019-07-19 NOTE — Progress Notes (Signed)
Hematology/Oncology Progress Note [facility] [phone] [phone]  Patient Care Team: Earlie Server, MD as PCP - General (Oncology)   Name of the patient: Mark Donaldson  [id number]  09/29/1960  Date of visit: 07/19/19   INTERVAL HISTORY-  59 y.o. male presents for follow-up of renal cell carcinoma treatments. Patient has past medical history CAD status post PCI to LAD in 2018, on aspirin and Plavix, CHF, history of focal sclerosing/crescentic GN-ANCA positive and CKD stage III. He was hospitalized from 05/02/2019-05/07/2019 due to gross hematuria. CT showed a 7.4 cm left lower pole renal mass concerning for RCC.  Patient also has necrotic lymphadenopathy in the mediastinum and the right supra hilar region which were highly suspicious for metastatic disease.  Bilateral lung nodules, also concerning for metastatic lung disease.  Right retrocrural lymphadenopathy with upper normal left periaortic lymph node. Patient was recommended for cytoreductive radical nephrectomy.  With history of ANCA positive vasculitis, also recommend thoracic lymphadenopathy biopsy to confirm distal metastasis.  Patient agreed to radical nephrectomy and declined bronchoscopy biopsy. He underwent left radical nephrectomy on 05/05/2019. Pathology showed pT3a pNx, RCC, conventional clear cell type, grade 3 Patient was discharged home. Today he presents to discuss pathology and the management plan. He was accompanied by his wife and daughter. Patient reports that he uses hydrocodone for pain.  Pain around the surgical site has improved.  He occasionally has tingling sensation around the surgical site.  Denies any fever or chills, drainage from the surgical site.  INTERVAL HISTORY Mark Donaldson is a 59 y.o. male who has above history reviewed by me today presents for follow up visit for management of metastatic RCC Problems and complaints are listed  below: Patient is Spanish-speaking.  Spanish interpreter present for the entire encounter for translation Patient has been on axitinib 5 mg twice daily.  Overall he tolerates with no significant side effects. Blood pressure stable, in the clinic today blood pressure is 126/96 Denies any headache, nausea, vomiting, shortness of breath, abdominal pain. Denies any skin itchiness.  He has small discoloration spots on his skin.     Review of systems- Review of Systems  Constitutional: Negative for appetite change, chills, fatigue, fever and unexpected weight change.  HENT:   Negative for hearing loss and voice change.   Eyes: Negative for eye problems and icterus.  Respiratory: Negative for chest tightness, cough and shortness of breath.   Cardiovascular: Negative for chest pain and leg swelling.  Gastrointestinal: Negative for abdominal distention and abdominal pain.  Endocrine: Negative for hot flashes.  Genitourinary: Negative for difficulty urinating, dysuria and frequency.   Musculoskeletal: Negative for arthralgias.  Skin: Negative for itching and rash.  Neurological: Negative for light-headedness and numbness.  Hematological: Negative for adenopathy. Does not bruise/bleed easily.  Psychiatric/Behavioral: Negative for confusion.    No Known Allergies  Patient Active Problem List   Diagnosis Date Noted  . CNS lesion 07/07/2019  . Anemia in stage 3b chronic kidney disease 06/07/2019  . Goals of care, counseling/discussion 05/23/2019  . Renal cell carcinoma of left kidney ([place]) 05/23/2019  . Renal cell carcinoma ([place]) 05/23/2019  . Palliative care encounter   . History of vasculitis   . Thoracic lymphadenopathy   . Lung nodule   . CAD S/P percutaneous coronary angioplasty 05/02/2019  . Kidney mass 05/02/2019  . Hydronephrosis of left kidney 05/02/2019  . UTI (urinary tract infection) 05/02/2019  . AKI (acute kidney injury) ([place]) 05/02/2019  . Gross hematuria  05/02/2019  .  Chronic diastolic CHF (congestive heart failure) ([place]) 05/02/2019  . Sepsis ([place]) 05/02/2019  . Severe sepsis ([place]) 05/02/2019  . Preop cardiovascular exam   . Hyperlipidemia 05/21/2016  . 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 Prediabetes 05/21/2016  . Alcohol abuse 05/18/2016  . Essential hypertension 05/18/2016  . Vasculitis, 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[place]) 05/18/2016  . Shortness of breath 05/17/2016     Past Medical History:  Diagnosis Date  . ANCA-positive vasculitis ([place])   . CAD (coronary artery disease)   . Essential hypertension   . HFrEF (heart failure with reduced ejection fraction) ([place])   . Hyperlipidemia LDL goal <70   . Ischemic cardiomyopathy   . Renal cell carcinoma ([place]) 05/23/2019  . Renal disorder      Past Surgical History:  Procedure Laterality Date  . CARDIAC SURGERY     cardiac cath with 2 stent placement  . LAPAROSCOPIC NEPHRECTOMY, HAND ASSISTED Left 05/05/2019   Procedure: HAND ASSISTED LAPAROSCOPIC NEPHRECTOMY;  Surgeon: Hollice Espy, MD;  Location: [facility] ORS;  Service: Urology;  Laterality: Left;    Social History   Socioeconomic History  . Marital status: Married    Spouse name: Not on file  . Number of children: Not on file  . Years of education: Not on file  . Highest education level: Not on file  Occupational History  . Not on file  Tobacco Use  . Smoking status: Never Smoker  . Smokeless tobacco: Never Used  Substance and Sexual Activity  . Alcohol use: Yes  . Drug use: Not on file  . Sexual activity: Not on file  Other Topics Concern  . Not on file  Social History Narrative  . Not on file   Social Determinants of Health   Financial Resource Strain:   . Difficulty of Paying Living Expenses:   Food Insecurity:   . Worried About Charity fundraiser in the Last Year:   . Arboriculturist in the Last Year:   Transportation Needs:   . Film/video editor (Medical):   Marland Kitchen Lack of Transportation  (Non-Medical):   Physical Activity:   . Days of Exercise per Week:   . Minutes of Exercise per Session:   Stress:   . Feeling of Stress :   Social Connections:   . Frequency of Communication with Friends and Family:   . Frequency of Social Gatherings with Friends and Family:   . Attends Religious Services:   . Active Member of Clubs or Organizations:   . Attends Archivist Meetings:   Marland Kitchen Marital Status:   Intimate Partner Violence:   . Fear of Current or Ex-Partner:   . Emotionally Abused:   Marland Kitchen Physically Abused:   . Sexually Abused:      Family History  Problem Relation Age of Onset  . Cancer Mother   . Blindness Brother      Current Outpatient Medications:  .  amLODipine (NORVASC) 10 MG tablet, Take 1 tablet (10 mg total) by mouth daily., Disp: 90 tablet, Rfl: 1 .  aspirin 81 MG chewable tablet, Chew 1 tablet by mouth daily., Disp: , Rfl:  .  atorvastatin (LIPITOR) 80 MG tablet, Take 80 mg by mouth daily., Disp: , Rfl:  .  axitinib (INLYTA) 5 MG tablet, Take 1 tablet (5 mg total) by mouth 2 (two) times daily., Disp: 60 tablet, Rfl: 0 .  clopidogrel (PLAVIX) 75 MG tablet, Take 1 tablet by mouth daily.,  Disp: , Rfl:  .  furosemide (LASIX) 20 MG tablet, Take 1 tablet by mouth every morning, Disp: , Rfl:  .  lisinopril (ZESTRIL) 10 MG tablet, Take 1 tablet by mouth daily., Disp: , Rfl:  .  metoprolol succinate (TOPROL-XL) 100 MG 24 hr tablet, Take 1.5 tablets by mouth daily., Disp: , Rfl:  .  melatonin 5 MG TABS, Take 1 tablet (5 mg total) by mouth at bedtime as needed., Disp: 30 tablet, Rfl: 0   Physical exam:  Vitals:   07/19/19 1340  BP: (!) 126/96  Pulse: 85  Resp: 18  Temp: (!) 96.1 F (35.6 C)  Weight: 160 lb (72.6 kg)   Physical Exam Constitutional:      General: He is not in acute distress.    Appearance: He is not diaphoretic.  HENT:     Head: Normocephalic and atraumatic.     Nose: Nose normal.     Mouth/Throat:     Pharynx: No oropharyngeal  exudate.  Eyes:     General: No scleral icterus.    Pupils: Pupils are equal, round, and reactive to light.  Cardiovascular:     Rate and Rhythm: Normal rate and regular rhythm.     Heart sounds: No murmur heard.   Pulmonary:     Effort: Pulmonary effort is normal. No respiratory distress.     Breath sounds: No rales.  Chest:     Chest wall: No tenderness.  Abdominal:     General: There is no distension.     Palpations: Abdomen is soft.     Tenderness: There is no abdominal tenderness.  Musculoskeletal:        General: Normal range of motion.     Cervical back: Normal range of motion and neck supple.  Skin:    General: Skin is warm and dry.     Findings: No erythema.  Neurological:     Mental Status: He is alert and oriented to person, place, and time.     Cranial Nerves: No cranial nerve deficit.     Motor: No abnormal muscle tone.     Coordination: Coordination normal.  Psychiatric:        Mood and Affect: Affect normal.        CMP Latest Ref Rng & Units 07/19/2019  Glucose 70 - 99 mg/dL 109(H)  BUN 6 - 20 mg/dL 41(H)  Creatinine 0.61 - 1.24 mg/dL 2.13(H)  Sodium 135 - 145 mmol/L 137  Potassium 3.5 - 5.1 mmol/L 4.9  Chloride 98 - 111 mmol/L 106  CO2 22 - 32 mmol/L 24  Calcium 8.9 - 10.3 mg/dL 8.9  Total Protein 6.5 - 8.1 g/dL 7.2  Total Bilirubin 0.3 - 1.2 mg/dL 0.6  Alkaline Phos 38 - 126 U/L 116  AST 15 - 41 U/L 22  ALT 0 - 44 U/L 21   CBC Latest Ref Rng & Units 07/19/2019  WBC 4.0 - 10.5 K/uL 8.0  Hemoglobin 13.0 - 17.0 g/dL 14.6  Hematocrit 39 - 52 % 41.0  Platelets 150 - 400 K/uL 163    RADIOGRAPHIC STUDIES: I have personally reviewed the radiological images as listed and agreed with the findings in the report. CT Head Wo Contrast  Result Date: 06/27/2019 CLINICAL DATA:  Metastatic renal cell carcinoma. Staging. Abnormal brain MRI. EXAM: CT HEAD WITHOUT CONTRAST TECHNIQUE: Contiguous axial images were obtained from the base of the skull through the  vertex without intravenous contrast. COMPARISON:  MRI 06/22/2019 FINDINGS: Brain: No abnormality is  seen affecting the brainstem or cerebellum. At the base of the brain on the left, there is focal asymmetric calcification associated with the enhancing lesion at the base of the brain. In the right posterior parietal region, the 2 cm cyst is not associated with visible calcification. In the right lateral parietal region, there is a 3 mm calcification associated with the 2 cm cystic lesion. In the left frontal region, there is focal calcification associated with the subcentimeter MRI lesion. 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 No hydrocephalus. No extra-axial fluid. No brain hemorrhage. No sign of ischemic infarction. Vascular: No abnormal vascular finding. Skull: Normal.  No lytic lesions. 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 Electronically Signed   By: Nelson Chimes M.D.   On: 06/27/2019 08:27   MR BRAIN W WO CONTRAST  Result Date: 06/23/2019 CLINICAL DATA:  Renal cell carcinoma metastatic to the lungs. Staging EXAM: MRI HEAD WITHOUT AND WITH CONTRAST TECHNIQUE: Multiplanar, multiecho pulse sequences of the brain and surrounding structures were obtained without and with intravenous contrast. CONTRAST:  23mL GADAVIST GADOBUTROL 1 MMOL/ML IV SOLN COMPARISON:  None. FINDINGS: Brain: Positive for enhancing brain lesions:: 1. Peripherally enhancing 5 mm nodule along the lower left basal ganglia. 2. Bilobed cystic lesion with enhancing mural nodule in the low right parietal lobe measuring 21 x 8 mm on 18:87. 3. Cyst and mural nodule lesion in the posterior right frontal lobe measuring up to 2 cm on 18:103. 4. 5 mm nodule along the cortex of  the high left frontal lobe on 18:110 Multiple areas of SWI hypointensity, much more numerous than the enhancing areas. Some of these a different appearance on the phase encoded series such that there may also be calcifications in addition to chronic blood products. Some the locations also appear to have a subarachnoid epicenter, and the left basal ganglia lesion is close to the brain surface. None the lesions show brain edema. No incidental infarct, hydrocephalus, or collection. Vascular: Normal flow voids and vascular enhancements Skull and upper cervical spine: Normal marrow signal Sinuses/Orbits: Negative Other: These results will be called to the ordering clinician or representative by the Radiologist Assistant, and communication documented in the PACS or [employer]. IMPRESSION: Positive for brain lesions which likely reflect metastatic disease in this setting, numbering 4 and measuring up to 2.1 cm. On gradient imaging there is suggestion of both mineralization and calcification. Suggest noncontrast CT to evaluate for calcifications (which would expanded differential to include neurocysticercosis if from an endemic area). Electronically Signed   By: Monte Fantasia M.D.   On: 06/23/2019 11:22    Assessment and plan-  Patient is a 59 y.o. male history of CAD, status post stent, CHF, history of ANCA positive sclerosing/crescentic GN, chronic kidney disease stage III presents for treatments of stage IV renal cell carcinoma 1. Renal cell carcinoma of left kidney (HCC)   2. Anemia in stage 3b chronic kidney disease   3. Encounter for antineoplastic immunotherapy   4. Encounter for antineoplastic chemotherapy   5. Brain lesion    Stage IV renal cell carcinoma Labs are reviewed and discussed with patient. Hold off immunotherapy today given acute on chronic renal failure.  See below. Continue axitinib 5 mg twice daily.  Clinically he tolerates well.  Blood pressure has been stable Consider increase  to axitinib 7 mg twice daily after he finishes 6 weeks of treatments.  #Acute on chronic renal failure, Advised patient to improve oral hydration. Patient will receive IV normal saline 1 L today Repeat blood work in 1 week. Increase of creatinine can be also secondary to immunotherapy toxicities, versus toxicities from axitinib. Patient also on Lasix 20 mg daily.  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 Steva Ready.  Pending work-up, blood test to be done at [facility]. Per ID, patient will need to have ophthalmology examination.  Patient has appointment with Dr. Edison Pace  We spent sufficient time to discuss many aspect of care, questions were answered to patient's satisfaction. Follow-up in 1 week for follow-up of kidney function and evaluation prior to immunotherapy. Thank you for allowing me to participate in the care of this patient.   Earlie Server, MD, PhD Hematology Oncology [facility] Pager- [phone] 07/19/2019

## 2019-07-19 NOTE — Progress Notes (Signed)
Patient here for follow up. No new concerns voiced.  °

## 2019-07-26 ENCOUNTER — Ambulatory Visit
Admission: RE | Admit: 2019-07-26 | Discharge: 2019-07-26 | Disposition: A | Discharge status: Home / Self Care | Payer: Self-pay | Source: Ambulatory Visit | Attending: Oncology | Admitting: Oncology

## 2019-07-26 ENCOUNTER — Inpatient Hospital Stay (HOSPITAL_BASED_OUTPATIENT_CLINIC_OR_DEPARTMENT_OTHER): Payer: Self-pay | Admitting: Oncology

## 2019-07-26 ENCOUNTER — Inpatient Hospital Stay: Payer: Self-pay

## 2019-07-26 ENCOUNTER — Other Ambulatory Visit: Payer: Self-pay

## 2019-07-26 ENCOUNTER — Encounter: Payer: Self-pay | Admitting: Oncology

## 2019-07-26 VITALS — BP 124/84 | HR 68 | Temp 96.9°F | Resp 16 | Wt 160.5 lb

## 2019-07-26 DIAGNOSIS — N179 Acute kidney failure, unspecified: Secondary | ICD-10-CM

## 2019-07-26 DIAGNOSIS — C642 Malignant neoplasm of left kidney, except renal pelvis: Secondary | ICD-10-CM

## 2019-07-26 LAB — CBC WITH DIFFERENTIAL/PLATELET
Abs Immature Granulocytes: 0.04 10*3/uL (ref 0.00–0.07)
Basophils Absolute: 0 10*3/uL (ref 0.0–0.1)
Basophils Relative: 1 %
Eosinophils Absolute: 0.5 10*3/uL (ref 0.0–0.5)
Eosinophils Relative: 7 %
HCT: 42.1 % (ref 39.0–52.0)
Hemoglobin: 14.8 g/dL (ref 13.0–17.0)
Immature Granulocytes: 1 %
Lymphocytes Relative: 23 %
Lymphs Abs: 1.7 10*3/uL (ref 0.7–4.0)
MCH: 28.5 pg (ref 26.0–34.0)
MCHC: 35.2 g/dL (ref 30.0–36.0)
MCV: 81 fL (ref 80.0–100.0)
Monocytes Absolute: 0.9 10*3/uL (ref 0.1–1.0)
Monocytes Relative: 12 %
Neutro Abs: 4.1 10*3/uL (ref 1.7–7.7)
Neutrophils Relative %: 56 %
Platelets: 167 10*3/uL (ref 150–400)
RBC: 5.2 MIL/uL (ref 4.22–5.81)
RDW: 12.5 % (ref 11.5–15.5)
WBC: 7.2 10*3/uL (ref 4.0–10.5)
nRBC: 0 % (ref 0.0–0.2)

## 2019-07-26 LAB — COMPREHENSIVE METABOLIC PANEL
ALT: 26 U/L (ref 0–44)
AST: 28 U/L (ref 15–41)
Albumin: 4 g/dL (ref 3.5–5.0)
Alkaline Phosphatase: 122 U/L (ref 38–126)
Anion gap: 10 (ref 5–15)
BUN: 42 mg/dL — ABNORMAL HIGH (ref 6–20)
CO2: 22 mmol/L (ref 22–32)
Calcium: 8.9 mg/dL (ref 8.9–10.3)
Chloride: 105 mmol/L (ref 98–111)
Creatinine, Ser: 2.23 mg/dL — ABNORMAL HIGH (ref 0.61–1.24)
GFR calc Af Amer: 37 mL/min — ABNORMAL LOW (ref >60)
GFR calc non Af Amer: 32 mL/min — ABNORMAL LOW (ref >60)
Glucose, Bld: 153 mg/dL — ABNORMAL HIGH (ref 70–99)
Potassium: 4.7 mmol/L (ref 3.5–5.1)
Sodium: 137 mmol/L (ref 135–145)
Total Bilirubin: 0.7 mg/dL (ref 0.3–1.2)
Total Protein: 7.5 g/dL (ref 6.5–8.1)

## 2019-07-26 NOTE — Progress Notes (Signed)
Hematology/Oncology Progress Note [facility] [phone] [phone]  Patient Care Team: Earlie Server, MD as PCP - General (Oncology)   Name of the patient: Mark Donaldson  [id number]  [birth year]  Date of visit: 07/26/19   INTERVAL HISTORY-  59 y.o. male presents for follow-up of renal cell carcinoma treatments. Patient has past medical history CAD status post PCI to LAD in 2018, on aspirin and Plavix, CHF, history of focal sclerosing/crescentic GN-ANCA positive and CKD stage III. He was hospitalized from 05/02/2019-05/07/2019 due to gross hematuria. CT showed a 7.4 cm left lower pole renal mass concerning for RCC.  Patient also has necrotic lymphadenopathy in the mediastinum and the right supra hilar region which were highly suspicious for metastatic disease.  Bilateral lung nodules, also concerning for metastatic lung disease.  Right retrocrural lymphadenopathy with upper normal left periaortic lymph node. Patient was recommended for cytoreductive radical nephrectomy.  With history of ANCA positive vasculitis, also recommend thoracic lymphadenopathy biopsy to confirm distal metastasis.  Patient agreed to radical nephrectomy and declined bronchoscopy biopsy. He underwent left radical nephrectomy on 05/05/2019. Pathology showed pT3a pNx, RCC, conventional clear cell type, grade 3 Patient was discharged home. Today he presents to discuss pathology and the management plan. He was accompanied by his wife and daughter. Patient reports that he uses hydrocodone for pain.  Pain around the surgical site has improved.  He occasionally has tingling sensation around the surgical site.  Denies any fever or chills, drainage from the surgical site.  INTERVAL HISTORY Mark Donaldson is a 59 y.o. male who has above history reviewed by me today presents for follow up visit for management of metastatic RCC Problems and complaints are listed  below: Patient is Spanish-speaking.  Spanish interpreter present for the entire encounter for translation Patient has been on axitinib 5 mg twice daily.  He reports easy bruising.  Mild back pain.  Otherwise no new complaints.  Denies any dysuria, flank pain, nausea, vomiting.   Review of systems- Review of Systems  Constitutional: Negative for appetite change, chills, fatigue, fever and unexpected weight change.  HENT:   Negative for hearing loss and voice change.   Eyes: Negative for eye problems and icterus.  Respiratory: Negative for chest tightness, cough and shortness of breath.   Cardiovascular: Negative for chest pain and leg swelling.  Gastrointestinal: Negative for abdominal distention and abdominal pain.  Endocrine: Negative for hot flashes.  Genitourinary: Negative for difficulty urinating, dysuria and frequency.   Musculoskeletal: Negative for arthralgias.  Skin: Negative for itching and rash.  Neurological: Negative for light-headedness and numbness.  Hematological: Negative for adenopathy. Bruises/bleeds easily.  Psychiatric/Behavioral: Negative for confusion.    No Known Allergies  Patient Active Problem List   Diagnosis Date Noted  . Encounter for antineoplastic immunotherapy 07/19/2019  . Encounter for antineoplastic chemotherapy 07/19/2019  . CNS lesion 07/07/2019  . Anemia in stage 3b chronic kidney disease 06/07/2019  . Goals of care, counseling/discussion 05/23/2019  . Renal cell carcinoma of left kidney ([place]) 05/23/2019  . Renal cell carcinoma ([place]) 05/23/2019  . Palliative care encounter   . History of vasculitis   . Thoracic lymphadenopathy   . Lung nodule   . CAD S/P percutaneous coronary angioplasty 05/02/2019  . Kidney mass 05/02/2019  . Hydronephrosis of left kidney 05/02/2019  . UTI (urinary tract infection) 05/02/2019  . AKI (acute kidney injury) ([place]) 05/02/2019  . Gross hematuria 05/02/2019  . Chronic diastolic CHF (congestive heart failure)  ([place])  05/02/2019  . Sepsis ([place]) 05/02/2019  . Severe sepsis ([place]) 05/02/2019  . Preop cardiovascular exam   . Hyperlipidemia 05/21/2016  . 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 Prediabetes 05/21/2016  . Alcohol abuse 05/18/2016  . Essential hypertension 05/18/2016  . Vasculitis, 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[place]) 05/18/2016  . Shortness of breath 05/17/2016     Past Medical History:  Diagnosis Date  . ANCA-positive vasculitis ([place])   . CAD (coronary artery disease)   . Essential hypertension   . HFrEF (heart failure with reduced ejection fraction) ([place])   . Hyperlipidemia LDL goal <70   . Ischemic cardiomyopathy   . Renal cell carcinoma ([place]) 05/23/2019  . Renal disorder      Past Surgical History:  Procedure Laterality Date  . CARDIAC SURGERY     cardiac cath with 2 stent placement  . LAPAROSCOPIC NEPHRECTOMY, HAND ASSISTED Left 05/05/2019   Procedure: HAND ASSISTED LAPAROSCOPIC NEPHRECTOMY;  Surgeon: Hollice Espy, MD;  Location: [facility] ORS;  Service: Urology;  Laterality: Left;    Social History   Socioeconomic History  . Marital status: Married    Spouse name: Not on file  . Number of children: Not on file  . Years of education: Not on file  . Highest education level: Not on file  Occupational History  . Not on file  Tobacco Use  . Smoking status: Never Smoker  . Smokeless tobacco: Never Used  Substance and Sexual Activity  . Alcohol use: Yes  . Drug use: Not on file  . Sexual activity: Not on file  Other Topics Concern  . Not on file  Social History Narrative  . Not on file   Social Determinants of Health   Financial Resource Strain:   . Difficulty of Paying Living Expenses:   Food Insecurity:   . Worried About Charity fundraiser in the Last Year:   . Arboriculturist in the Last Year:   Transportation Needs:   . Film/video editor (Medical):   Marland Kitchen Lack of Transportation (Non-Medical):   Physical Activity:   . Days of  Exercise per Week:   . Minutes of Exercise per Session:   Stress:   . Feeling of Stress :   Social Connections:   . Frequency of Communication with Friends and Family:   . Frequency of Social Gatherings with Friends and Family:   . Attends Religious Services:   . Active Member of Clubs or Organizations:   . Attends Archivist Meetings:   Marland Kitchen Marital Status:   Intimate Partner Violence:   . Fear of Current or Ex-Partner:   . Emotionally Abused:   Marland Kitchen Physically Abused:   . Sexually Abused:      Family History  Problem Relation Age of Onset  . Cancer Mother   . Blindness Brother      Current Outpatient Medications:  .  amLODipine (NORVASC) 10 MG tablet, Take 1 tablet (10 mg total) by mouth daily., Disp: 90 tablet, Rfl: 1 .  aspirin 81 MG chewable tablet, Chew 1 tablet by mouth daily., Disp: , Rfl:  .  atorvastatin (LIPITOR) 80 MG tablet, Take 80 mg by mouth daily., Disp: , Rfl:  .  axitinib (INLYTA) 5 MG tablet, Take 1 tablet (5 mg total) by mouth 2 (two) times daily., Disp: 60 tablet, Rfl: 0 .  clopidogrel (PLAVIX) 75 MG tablet, Take 1 tablet by mouth daily., Disp: , Rfl:  .  furosemide (LASIX) 20 MG tablet,  Take 1 tablet by mouth every morning, Disp: , Rfl:  .  lisinopril (ZESTRIL) 10 MG tablet, Take 1 tablet by mouth daily., Disp: , Rfl:  .  metoprolol succinate (TOPROL-XL) 100 MG 24 hr tablet, Take 1.5 tablets by mouth daily., Disp: , Rfl:  .  melatonin 5 MG TABS, Take 1 tablet (5 mg total) by mouth at bedtime as needed. (Patient not taking: Reported on 07/26/2019), Disp: 30 tablet, Rfl: 0   Physical exam:  Vitals:   07/26/19 1020  BP: 124/84  Pulse: 68  Resp: 16  Temp: (!) 96.9 F (36.1 C)  Weight: 160 lb 8 oz (72.8 kg)   Physical Exam Constitutional:      General: He is not in acute distress.    Appearance: He is not diaphoretic.  HENT:     Head: Normocephalic and atraumatic.     Nose: Nose normal.     Mouth/Throat:     Pharynx: No oropharyngeal  exudate.  Eyes:     General: No scleral icterus.    Pupils: Pupils are equal, round, and reactive to light.  Cardiovascular:     Rate and Rhythm: Normal rate and regular rhythm.     Heart sounds: No murmur heard.   Pulmonary:     Effort: Pulmonary effort is normal. No respiratory distress.     Breath sounds: No rales.  Chest:     Chest wall: No tenderness.  Abdominal:     General: There is no distension.     Palpations: Abdomen is soft.     Tenderness: There is no abdominal tenderness.  Musculoskeletal:        General: Normal range of motion.     Cervical back: Normal range of motion and neck supple.  Skin:    General: Skin is warm and dry.     Findings: No erythema.     Comments: 1-2 bruises on upper extremities.  Neurological:     Mental Status: He is alert and oriented to person, place, and time.     Cranial Nerves: No cranial nerve deficit.     Motor: No abnormal muscle tone.     Coordination: Coordination normal.  Psychiatric:        Mood and Affect: Affect normal.        CMP Latest Ref Rng & Units 07/26/2019  Glucose 70 - 99 mg/dL 153(H)  BUN 6 - 20 mg/dL 42(H)  Creatinine 0.61 - 1.24 mg/dL 2.23(H)  Sodium 135 - 145 mmol/L 137  Potassium 3.5 - 5.1 mmol/L 4.7  Chloride 98 - 111 mmol/L 105  CO2 22 - 32 mmol/L 22  Calcium 8.9 - 10.3 mg/dL 8.9  Total Protein 6.5 - 8.1 g/dL 7.5  Total Bilirubin 0.3 - 1.2 mg/dL 0.7  Alkaline Phos 38 - 126 U/L 122  AST 15 - 41 U/L 28  ALT 0 - 44 U/L 26   CBC Latest Ref Rng & Units 07/26/2019  WBC 4.0 - 10.5 K/uL 7.2  Hemoglobin 13.0 - 17.0 g/dL 14.8  Hematocrit 39 - 52 % 42.1  Platelets 150 - 400 K/uL 167    RADIOGRAPHIC STUDIES: I have personally reviewed the radiological images as listed and agreed with the findings in the report. CT Head Wo Contrast  Result Date: 06/27/2019 CLINICAL DATA:  Metastatic renal cell carcinoma. Staging. Abnormal brain MRI. EXAM: CT HEAD WITHOUT CONTRAST TECHNIQUE: Contiguous axial images were  obtained from the base of the skull through the vertex without intravenous contrast. COMPARISON:  MRI  06/22/2019 FINDINGS: Brain: No abnormality is seen affecting the brainstem or cerebellum. At the base of the brain on the left, there is focal asymmetric calcification associated with the enhancing lesion at the base of the brain. In the right posterior parietal region, the 2 cm cyst is not associated with visible calcification. In the right lateral parietal region, there is a 3 mm calcification associated with the 2 cm cystic lesion. In the left frontal region, there is focal calcification associated with the subcentimeter MRI lesion. 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 No hydrocephalus. No extra-axial fluid. No brain hemorrhage. No sign of ischemic infarction. Vascular: No abnormal vascular finding. Skull: Normal.  No lytic lesions. 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 Electronically Signed   By: Nelson Chimes M.D.   On: 06/27/2019 08:27   US RENAL  Result Date: 07/26/2019 CLINICAL DATA:  Acute kidney injury. History of left nephrectomy for renal cell carcinoma. EXAM: RENAL / URINARY TRACT ULTRASOUND COMPLETE COMPARISON:  Abdominopelvic CT 05/03/2019. Abdominal ultrasound 07/23/2007. FINDINGS: Right Kidney: Renal measurements: 9.6 x 5.6 x 5.2 cm = volume: 148.4 mL. Mildly increased cortical echogenicity without focal abnormality. No hydronephrosis. Left Kidney: Surgically absent.  No mass identified in the nephrectomy bed. Bladder: Appears normal for the degree of distention. A right ureteral jet is visualized. Other: None. IMPRESSION: Mildly increased right renal cortical echogenicity suspicious for medical renal disease. No  hydronephrosis or focal abnormality identified. Previous left nephrectomy. Electronically Signed   By: Richardean Sale M.D.   On: 07/26/2019 13:44    Assessment and plan-  Patient is a 59 y.o. male history of CAD, status post stent, CHF, history of ANCA positive sclerosing/crescentic GN, chronic kidney disease stage III presents for treatments of stage IV renal cell carcinoma 1. Renal cell carcinoma of left kidney (H[facility])   2. AKI (acute kidney injury) ([place])   3. Anemia in stage 3b chronic kidney disease    Stage IV renal cell carcinoma Labs are reviewed and discussed with patient. Hold off immunotherapy due to acute kidney failure.  See below. Advised patient to also stop axitinib 5 mg twice daily for now.   #AKI, stat ultrasound was obtained. Ultrasound kidney showed mildly increased right renal cortical echogenicity suspicious for medical renal disease.  No hydronephrosis or focal abnormality identified.  Previous left nephrectomy. Etiology of the AKI possible dehydration versus drug effect. Encourage oral hydration.  Stop Lasix, Patient will proceed with IV normal saline 500 cc tomorrow.  Repeat BMP on 07/29/2019 +/- possible IV fluid. Also advised patient to stop axitinib for now.  #History of CHF, 05/04/2019 LVEF 60 to 65%.  Hold Lasix.  Continue lisinopril, metoprolol.  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 Steva Ready.  Pending work-up, blood test to be done at [facility].Per ID, patient will need to have ophthalmology examination.  Patient has appointment with Dr. Edison Pace  We spent sufficient time to discuss many aspect of care, questions were answered to patient's satisfaction. Follow-up in 1 week for follow-up of kidney function and evaluation prior to immunotherapy. Thank you for allowing me to participate in the care of this patient.   Earlie Server, MD, PhD Hematology Oncology [facility] Pager- [phone] 07/26/2019

## 2019-07-26 NOTE — Progress Notes (Signed)
Patient has noticed easy bruising on arms.  He had diarrhea yesterday with no bowel movement today.  Having trouble sleeping at night.  Yesterday he was having episodes of sharp pain on right side low back.

## 2019-07-27 ENCOUNTER — Inpatient Hospital Stay: Payer: Self-pay

## 2019-07-27 VITALS — BP 106/78 | HR 77 | Temp 98.2°F | Resp 16 | Wt 160.0 lb

## 2019-07-27 DIAGNOSIS — N179 Acute kidney failure, unspecified: Secondary | ICD-10-CM

## 2019-07-27 MED ORDER — SODIUM CHLORIDE 0.9 % IV SOLN
Freq: Once | INTRAVENOUS | Status: AC
  Filled 2019-07-27: qty 250

## 2019-07-28 ENCOUNTER — Other Ambulatory Visit: Payer: Self-pay

## 2019-07-28 DIAGNOSIS — C642 Malignant neoplasm of left kidney, except renal pelvis: Secondary | ICD-10-CM

## 2019-07-29 ENCOUNTER — Inpatient Hospital Stay: Payer: Self-pay

## 2019-07-29 ENCOUNTER — Other Ambulatory Visit: Payer: Self-pay

## 2019-07-29 VITALS — BP 106/70 | HR 66 | Temp 98.1°F | Resp 18

## 2019-07-29 DIAGNOSIS — C642 Malignant neoplasm of left kidney, except renal pelvis: Secondary | ICD-10-CM

## 2019-07-29 DIAGNOSIS — N179 Acute kidney failure, unspecified: Secondary | ICD-10-CM

## 2019-07-29 LAB — BASIC METABOLIC PANEL
Anion gap: 8 (ref 5–15)
BUN: 47 mg/dL — ABNORMAL HIGH (ref 6–20)
CO2: 22 mmol/L (ref 22–32)
Calcium: 9 mg/dL (ref 8.9–10.3)
Chloride: 107 mmol/L (ref 98–111)
Creatinine, Ser: 2.22 mg/dL — ABNORMAL HIGH (ref 0.61–1.24)
GFR calc Af Amer: 37 mL/min — ABNORMAL LOW (ref >60)
GFR calc non Af Amer: 32 mL/min — ABNORMAL LOW (ref >60)
Glucose, Bld: 157 mg/dL — ABNORMAL HIGH (ref 70–99)
Potassium: 5 mmol/L (ref 3.5–5.1)
Sodium: 137 mmol/L (ref 135–145)

## 2019-07-29 MED ORDER — SODIUM CHLORIDE 0.9 % IV SOLN
Freq: Once | INTRAVENOUS | Status: AC
  Filled 2019-07-29: qty 250

## 2019-08-02 ENCOUNTER — Other Ambulatory Visit: Payer: Self-pay

## 2019-08-02 ENCOUNTER — Inpatient Hospital Stay: Payer: Self-pay

## 2019-08-02 ENCOUNTER — Inpatient Hospital Stay (HOSPITAL_BASED_OUTPATIENT_CLINIC_OR_DEPARTMENT_OTHER): Payer: Self-pay | Admitting: Oncology

## 2019-08-02 ENCOUNTER — Encounter: Payer: Self-pay | Admitting: Oncology

## 2019-08-02 VITALS — BP 135/90 | HR 73 | Temp 96.0°F | Resp 18 | Wt 162.5 lb

## 2019-08-02 DIAGNOSIS — C642 Malignant neoplasm of left kidney, except renal pelvis: Secondary | ICD-10-CM

## 2019-08-02 DIAGNOSIS — D631 Anemia in chronic kidney disease: Secondary | ICD-10-CM

## 2019-08-02 DIAGNOSIS — N1832 Chronic kidney disease, stage 3b: Secondary | ICD-10-CM

## 2019-08-02 LAB — COMPREHENSIVE METABOLIC PANEL
ALT: 19 U/L (ref 0–44)
AST: 21 U/L (ref 15–41)
Albumin: 3.9 g/dL (ref 3.5–5.0)
Alkaline Phosphatase: 122 U/L (ref 38–126)
Anion gap: 7 (ref 5–15)
BUN: 44 mg/dL — ABNORMAL HIGH (ref 6–20)
CO2: 22 mmol/L (ref 22–32)
Calcium: 8.9 mg/dL (ref 8.9–10.3)
Chloride: 108 mmol/L (ref 98–111)
Creatinine, Ser: 2.02 mg/dL — ABNORMAL HIGH (ref 0.61–1.24)
GFR calc Af Amer: 41 mL/min — ABNORMAL LOW (ref >60)
GFR calc non Af Amer: 36 mL/min — ABNORMAL LOW (ref >60)
Glucose, Bld: 105 mg/dL — ABNORMAL HIGH (ref 70–99)
Potassium: 4.7 mmol/L (ref 3.5–5.1)
Sodium: 137 mmol/L (ref 135–145)
Total Bilirubin: 0.7 mg/dL (ref 0.3–1.2)
Total Protein: 7.2 g/dL (ref 6.5–8.1)

## 2019-08-02 LAB — CBC WITH DIFFERENTIAL/PLATELET
Abs Immature Granulocytes: 0.03 10*3/uL (ref 0.00–0.07)
Basophils Absolute: 0 10*3/uL (ref 0.0–0.1)
Basophils Relative: 0 %
Eosinophils Absolute: 0.5 10*3/uL (ref 0.0–0.5)
Eosinophils Relative: 6 %
HCT: 40.9 % (ref 39.0–52.0)
Hemoglobin: 14.1 g/dL (ref 13.0–17.0)
Immature Granulocytes: 0 %
Lymphocytes Relative: 21 %
Lymphs Abs: 1.9 10*3/uL (ref 0.7–4.0)
MCH: 29.1 pg (ref 26.0–34.0)
MCHC: 34.5 g/dL (ref 30.0–36.0)
MCV: 84.3 fL (ref 80.0–100.0)
Monocytes Absolute: 1.1 10*3/uL — ABNORMAL HIGH (ref 0.1–1.0)
Monocytes Relative: 12 %
Neutro Abs: 5.4 10*3/uL (ref 1.7–7.7)
Neutrophils Relative %: 61 %
Platelets: 167 10*3/uL (ref 150–400)
RBC: 4.85 MIL/uL (ref 4.22–5.81)
RDW: 13 % (ref 11.5–15.5)
WBC: 8.9 10*3/uL (ref 4.0–10.5)
nRBC: 0 % (ref 0.0–0.2)

## 2019-08-02 MED ORDER — SODIUM CHLORIDE 0.9 % IV SOLN
200.0000 mg | Freq: Once | INTRAVENOUS | Status: AC
  Administered 2019-08-02: 200 mg via INTRAVENOUS
  Filled 2019-08-02: qty 8

## 2019-08-02 MED ORDER — SODIUM CHLORIDE 0.9 % IV SOLN
Freq: Once | INTRAVENOUS | Status: AC
  Filled 2019-08-02: qty 250

## 2019-08-02 NOTE — Progress Notes (Signed)
Hematology/Oncology Progress Note [facility] Telephone:([phone] [phone]  Patient Care Team: Earlie Server, MD as PCP - General (Oncology)   Name of the patient: Mark Donaldson  [id number]  [birth year]  Date of visit: 08/02/19   INTERVAL HISTORY-  59 y.o. male presents for follow-up of renal cell carcinoma treatments. Patient has past medical history CAD status post PCI to LAD in 2018, on aspirin and Plavix, CHF, history of focal sclerosing/crescentic GN-ANCA positive and CKD stage III. He was hospitalized from 05/02/2019-05/07/2019 due to gross hematuria. CT showed a 7.4 cm left lower pole renal mass concerning for RCC.  Patient also has necrotic lymphadenopathy in the mediastinum and the right supra hilar region which were highly suspicious for metastatic disease.  Bilateral lung nodules, also concerning for metastatic lung disease.  Right retrocrural lymphadenopathy with upper normal left periaortic lymph node. Patient was recommended for cytoreductive radical nephrectomy.  With history of ANCA positive vasculitis, also recommend thoracic lymphadenopathy biopsy to confirm distal metastasis.  Patient agreed to radical nephrectomy and declined bronchoscopy biopsy. He underwent left radical nephrectomy on 05/05/2019. Pathology showed pT3a pNx, RCC, conventional clear cell type, grade 3 Patient was discharged home. Today he presents to discuss pathology and the management plan. He was accompanied by his wife and daughter. Patient reports that he uses hydrocodone for pain.  Pain around the surgical site has improved.  He occasionally has tingling sensation around the surgical site.  Denies any fever or chills, drainage from the surgical site.  INTERVAL HISTORY Mark Donaldson is a 59 y.o. male who has above history reviewed by me today presents for follow up visit for management of metastatic RCC Problems and complaints are listed  below: Patient is Spanish-speaking.  Spanish interpreter present for the entire encounter for translation Axitinib 5 mg twice daily and Lasix 20 mg are on hold due to worsening of kidney function. He reports feeling well.  Denies any shortness of breath, chest pain, leg swelling. He had 4 episodes of diarrhea yesterday after eating food made by a friend at a party. Last episode of loose bowel movement was yesterday afternoon and diarrhea spontaneously resolved.  He has not had any bowel movement this morning    Review of systems- Review of Systems  Constitutional: Negative for appetite change, chills, fatigue, fever and unexpected weight change.  HENT:   Negative for hearing loss and voice change.   Eyes: Negative for eye problems and icterus.  Respiratory: Negative for chest tightness, cough and shortness of breath.   Cardiovascular: Negative for chest pain and leg swelling.  Gastrointestinal: Negative for abdominal distention and abdominal pain.  Endocrine: Negative for hot flashes.  Genitourinary: Negative for difficulty urinating, dysuria and frequency.   Musculoskeletal: Negative for arthralgias.  Skin: Negative for itching and rash.  Neurological: Negative for light-headedness and numbness.  Hematological: Negative for adenopathy. Bruises/bleeds easily.  Psychiatric/Behavioral: Negative for confusion.    No Known Allergies  Patient Active Problem List   Diagnosis Date Noted  . Encounter for antineoplastic immunotherapy 07/19/2019  . Encounter for antineoplastic chemotherapy 07/19/2019  . CNS lesion 07/07/2019  . Anemia in stage 3b chronic kidney disease 06/07/2019  . Goals of care, counseling/discussion 05/23/2019  . Renal cell carcinoma of left kidney ([place]) 05/23/2019  . Renal cell carcinoma ([place]) 05/23/2019  . Palliative care encounter   . History of vasculitis   . Thoracic lymphadenopathy   . Lung nodule   . CAD S/P percutaneous coronary angioplasty 05/02/2019  .  Kidney mass 05/02/2019  . Hydronephrosis of left kidney 05/02/2019  . UTI (urinary tract infection) 05/02/2019  . AKI (acute kidney injury) ([place]) 05/02/2019  . Gross hematuria 05/02/2019  . Chronic diastolic CHF (congestive heart failure) ([place]) 05/02/2019  . Sepsis ([place]) 05/02/2019  . Severe sepsis ([place]) 05/02/2019  . Preop cardiovascular exam   . Hyperlipidemia 05/21/2016  . 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 Prediabetes 05/21/2016  . Alcohol abuse 05/18/2016  . Essential hypertension 05/18/2016  . Vasculitis, 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[place]) 05/18/2016  . Shortness of breath 05/17/2016     Past Medical History:  Diagnosis Date  . ANCA-positive vasculitis ([place])   . CAD (coronary artery disease)   . Essential hypertension   . HFrEF (heart failure with reduced ejection fraction) ([place])   . Hyperlipidemia LDL goal <70   . Ischemic cardiomyopathy   . Renal cell carcinoma ([place]) 05/23/2019  . Renal disorder      Past Surgical History:  Procedure Laterality Date  . CARDIAC SURGERY     cardiac cath with 2 stent placement  . LAPAROSCOPIC NEPHRECTOMY, HAND ASSISTED Left 05/05/2019   Procedure: HAND ASSISTED LAPAROSCOPIC NEPHRECTOMY;  Surgeon: Hollice Espy, MD;  Location: [facility] ORS;  Service: Urology;  Laterality: Left;    Social History   Socioeconomic History  . Marital status: Married    Spouse name: Not on file  . Number of children: Not on file  . Years of education: Not on file  . Highest education level: Not on file  Occupational History  . Not on file  Tobacco Use  . Smoking status: Never Smoker  . Smokeless tobacco: Never Used  Substance and Sexual Activity  . Alcohol use: Yes  . Drug use: Not on file  . Sexual activity: Not on file  Other Topics Concern  . Not on file  Social History Narrative  . Not on file   Social Determinants of Health   Financial Resource Strain:   . Difficulty of Paying Living Expenses:   Food Insecurity:    . Worried About Charity fundraiser in the Last Year:   . Arboriculturist in the Last Year:   Transportation Needs:   . Film/video editor (Medical):   Marland Kitchen Lack of Transportation (Non-Medical):   Physical Activity:   . Days of Exercise per Week:   . Minutes of Exercise per Session:   Stress:   . Feeling of Stress :   Social Connections:   . Frequency of Communication with Friends and Family:   . Frequency of Social Gatherings with Friends and Family:   . Attends Religious Services:   . Active Member of Clubs or Organizations:   . Attends Archivist Meetings:   Marland Kitchen Marital Status:   Intimate Partner Violence:   . Fear of Current or Ex-Partner:   . Emotionally Abused:   Marland Kitchen Physically Abused:   . Sexually Abused:      Family History  Problem Relation Age of Onset  . Cancer Mother   . Blindness Brother      Current Outpatient Medications:  .  amLODipine (NORVASC) 10 MG tablet, Take 1 tablet (10 mg total) by mouth daily., Disp: 90 tablet, Rfl: 1 .  aspirin 81 MG chewable tablet, Chew 1 tablet by mouth daily., Disp: , Rfl:  .  atorvastatin (LIPITOR) 80 MG tablet, Take 80 mg by mouth daily., Disp: , Rfl:  .  clopidogrel (PLAVIX) 75 MG tablet, Take 1  tablet by mouth daily., Disp: , Rfl:  .  lisinopril (ZESTRIL) 10 MG tablet, Take 1 tablet by mouth daily., Disp: , Rfl:  .  metoprolol succinate (TOPROL-XL) 100 MG 24 hr tablet, Take 1.5 tablets by mouth daily., Disp: , Rfl:  .  axitinib (INLYTA) 5 MG tablet, Take 1 tablet (5 mg total) by mouth 2 (two) times daily. (Patient not taking: Reported on 08/02/2019), Disp: 60 tablet, Rfl: 0 .  furosemide (LASIX) 20 MG tablet, Take 1 tablet by mouth every morning (Patient not taking: Reported on 08/02/2019), Disp: , Rfl:  .  melatonin 5 MG TABS, Take 1 tablet (5 mg total) by mouth at bedtime as needed. (Patient not taking: Reported on 08/02/2019), Disp: 30 tablet, Rfl: 0   Physical exam:  Vitals:   08/02/19 0945  BP: 135/90  Pulse:  73  Resp: 18  Temp: (!) 96 F (35.6 C)  Weight: 162 lb 8 oz (73.7 kg)   Physical Exam Constitutional:      General: He is not in acute distress.    Appearance: He is not diaphoretic.  HENT:     Head: Normocephalic and atraumatic.     Nose: Nose normal.     Mouth/Throat:     Pharynx: No oropharyngeal exudate.  Eyes:     General: No scleral icterus.    Pupils: Pupils are equal, round, and reactive to light.  Cardiovascular:     Rate and Rhythm: Normal rate and regular rhythm.     Heart sounds: No murmur heard.   Pulmonary:     Effort: Pulmonary effort is normal. No respiratory distress.     Breath sounds: No rales.  Chest:     Chest wall: No tenderness.  Abdominal:     General: There is no distension.     Palpations: Abdomen is soft.     Tenderness: There is no abdominal tenderness.  Musculoskeletal:        General: Normal range of motion.     Cervical back: Normal range of motion and neck supple.  Skin:    General: Skin is warm and dry.     Findings: No erythema.     Comments: 1-2 bruises on upper extremities.  Neurological:     Mental Status: He is alert and oriented to person, place, and time.     Cranial Nerves: No cranial nerve deficit.     Motor: No abnormal muscle tone.     Coordination: Coordination normal.  Psychiatric:        Mood and Affect: Affect normal.        CMP Latest Ref Rng & Units 08/02/2019  Glucose 70 - 99 mg/dL 105(H)  BUN 6 - 20 mg/dL 44(H)  Creatinine 0.61 - 1.24 mg/dL 2.02(H)  Sodium 135 - 145 mmol/L 137  Potassium 3.5 - 5.1 mmol/L 4.7  Chloride 98 - 111 mmol/L 108  CO2 22 - 32 mmol/L 22  Calcium 8.9 - 10.3 mg/dL 8.9  Total Protein 6.5 - 8.1 g/dL 7.2  Total Bilirubin 0.3 - 1.2 mg/dL 0.7  Alkaline Phos 38 - 126 U/L 122  AST 15 - 41 U/L 21  ALT 0 - 44 U/L 19   CBC Latest Ref Rng & Units 08/02/2019  WBC 4.0 - 10.5 K/uL 8.9  Hemoglobin 13.0 - 17.0 g/dL 14.1  Hematocrit 39 - 52 % 40.9  Platelets 150 - 400 K/uL 167     RADIOGRAPHIC STUDIES: I have personally reviewed the radiological images as listed and agreed  with the findings in the report. US RENAL  Result Date: 07/26/2019 CLINICAL DATA:  Acute kidney injury. History of left nephrectomy for renal cell carcinoma. EXAM: RENAL / URINARY TRACT ULTRASOUND COMPLETE COMPARISON:  Abdominopelvic CT 05/03/2019. Abdominal ultrasound 07/23/2007. FINDINGS: Right Kidney: Renal measurements: 9.6 x 5.6 x 5.2 cm = volume: 148.4 mL. Mildly increased cortical echogenicity without focal abnormality. No hydronephrosis. Left Kidney: Surgically absent.  No mass identified in the nephrectomy bed. Bladder: Appears normal for the degree of distention. A right ureteral jet is visualized. Other: None. IMPRESSION: Mildly increased right renal cortical echogenicity suspicious for medical renal disease. No hydronephrosis or focal abnormality identified. Previous left nephrectomy. Electronically Signed   By: Richardean Sale M.D.   On: 07/26/2019 13:44    Assessment and plan-  Patient is a 59 y.o. male history of CAD, status post stent, CHF, history of ANCA positive sclerosing/crescentic GN, chronic kidney disease stage III presents for treatments of stage IV renal cell carcinoma 1. Anemia in stage 3b chronic kidney disease    Stage IV renal cell carcinoma Labs are reviewed and discussed with patient. Proceed with Keytruda treatment today.  Repeat BMP in 10 days  #Chronic kidney disease, Creatinine appears stable and slightly improved. Advised patient to continue oral hydration. Axitinib and Lasix have been hold. I will refer patient to establish care with Dr. Candiss Norse nephrology for further evaluation.  #History of diastolic CHF, 7/79/3968 LVEF 60 to 65%.  Lasix was held due to worsening of kidney function. Continue lisinopril and metoprolol.  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 Steva Ready.  Pending work-up, blood test to be done at [facility].Per ID, patient  will need to have ophthalmology examination.  Patient has had ophthalmology evaluation.  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  We spent sufficient time to discuss many aspect of care, questions were answered to patient's satisfaction. Follow-up in 3 week for follow-up of kidney function and evaluation prior to immunotherapy. Thank you for allowing me to participate in the care of this patient.   Earlie Server, MD, PhD Hematology Oncology [facility] Pager- [phone] 08/02/2019

## 2019-08-02 NOTE — Progress Notes (Signed)
Pt here for follow up. Pt reports he had some diarrhea yesterday. He also states that he had some sharp pains to mid back.

## 2019-08-04 ENCOUNTER — Other Ambulatory Visit: Payer: Self-pay

## 2019-08-04 ENCOUNTER — Emergency Department: Payer: No Typology Code available for payment source

## 2019-08-04 ENCOUNTER — Emergency Department
Admission: EM | Admit: 2019-08-04 | Discharge: 2019-08-04 | Disposition: A | Discharge status: Home / Self Care | Payer: No Typology Code available for payment source | Attending: Emergency Medicine | Admitting: Emergency Medicine

## 2019-08-04 ENCOUNTER — Other Ambulatory Visit: Payer: Self-pay | Admitting: Infectious Diseases

## 2019-08-04 ENCOUNTER — Encounter: Payer: Self-pay | Admitting: Emergency Medicine

## 2019-08-04 DIAGNOSIS — R930 Abnormal findings on diagnostic imaging of skull and head, not elsewhere classified: Secondary | ICD-10-CM

## 2019-08-04 DIAGNOSIS — Z955 Presence of coronary angioplasty implant and graft: Secondary | ICD-10-CM | POA: Insufficient documentation

## 2019-08-04 DIAGNOSIS — I5032 Chronic diastolic (congestive) heart failure: Secondary | ICD-10-CM | POA: Insufficient documentation

## 2019-08-04 DIAGNOSIS — Y999 Unspecified external cause status: Secondary | ICD-10-CM | POA: Diagnosis not present

## 2019-08-04 DIAGNOSIS — R937 Abnormal findings on diagnostic imaging of other parts of musculoskeletal system: Secondary | ICD-10-CM

## 2019-08-04 DIAGNOSIS — Z85528 Personal history of other malignant neoplasm of kidney: Secondary | ICD-10-CM | POA: Diagnosis not present

## 2019-08-04 DIAGNOSIS — S20211A Contusion of right front wall of thorax, initial encounter: Secondary | ICD-10-CM | POA: Diagnosis not present

## 2019-08-04 DIAGNOSIS — Y92410 Unspecified street and highway as the place of occurrence of the external cause: Secondary | ICD-10-CM | POA: Insufficient documentation

## 2019-08-04 DIAGNOSIS — Z7982 Long term (current) use of aspirin: Secondary | ICD-10-CM | POA: Diagnosis not present

## 2019-08-04 DIAGNOSIS — U071 COVID-19: Secondary | ICD-10-CM | POA: Diagnosis not present

## 2019-08-04 DIAGNOSIS — I110 Hypertensive heart disease with heart failure: Secondary | ICD-10-CM | POA: Insufficient documentation

## 2019-08-04 DIAGNOSIS — Z7902 Long term (current) use of antithrombotics/antiplatelets: Secondary | ICD-10-CM | POA: Diagnosis not present

## 2019-08-04 DIAGNOSIS — Y9389 Activity, other specified: Secondary | ICD-10-CM | POA: Diagnosis not present

## 2019-08-04 DIAGNOSIS — I2510 Atherosclerotic heart disease of native coronary artery without angina pectoris: Secondary | ICD-10-CM | POA: Insufficient documentation

## 2019-08-04 DIAGNOSIS — Z79899 Other long term (current) drug therapy: Secondary | ICD-10-CM | POA: Diagnosis not present

## 2019-08-04 DIAGNOSIS — N1832 Chronic kidney disease, stage 3b: Secondary | ICD-10-CM

## 2019-08-04 DIAGNOSIS — S299XXA Unspecified injury of thorax, initial encounter: Secondary | ICD-10-CM | POA: Diagnosis present

## 2019-08-04 LAB — BASIC METABOLIC PANEL
Anion gap: 8 (ref 5–15)
BUN: 49 mg/dL — ABNORMAL HIGH (ref 6–20)
CO2: 21 mmol/L — ABNORMAL LOW (ref 22–32)
Calcium: 9.4 mg/dL (ref 8.9–10.3)
Chloride: 106 mmol/L (ref 98–111)
Creatinine, Ser: 2.08 mg/dL — ABNORMAL HIGH (ref 0.61–1.24)
GFR calc Af Amer: 40 mL/min — ABNORMAL LOW (ref >60)
GFR calc non Af Amer: 34 mL/min — ABNORMAL LOW (ref >60)
Glucose, Bld: 98 mg/dL (ref 70–99)
Potassium: 5 mmol/L (ref 3.5–5.1)
Sodium: 135 mmol/L (ref 135–145)

## 2019-08-04 MED ORDER — HYDROCODONE-ACETAMINOPHEN 5-325 MG PO TABS: 1.0000 | ORAL_TABLET | ORAL | 0 refills | Status: DC | PRN
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
## 2019-08-04 NOTE — ED Notes (Signed)
See triage note.
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
## 2019-08-05 LAB — STRONGYLOIDES, AB, IGG: Strongyloides, Ab, IgG: NEGATIVE

## 2019-08-07 LAB — QUANTIFERON-TB GOLD PLUS (RQFGPL)
QuantiFERON Mitogen Value: >10 IU/mL
QuantiFERON Nil Value: 0.04 IU/mL
QuantiFERON TB1 Ag Value: 0.03 IU/mL
QuantiFERON TB2 Ag Value: 0.03 IU/mL

## 2019-08-07 LAB — QUANTIFERON-TB GOLD PLUS: QuantiFERON-TB Gold Plus: NEGATIVE

## 2019-08-10 ENCOUNTER — Telehealth: Payer: Self-pay

## 2019-08-10 NOTE — Telephone Encounter (Signed)
Received call From [facility] and they gave me an appt for pt: Sept 9 @ 9 am in [place]. Pt has been notified of appt.

## 2019-08-11 ENCOUNTER — Other Ambulatory Visit: Payer: Self-pay

## 2019-08-11 DIAGNOSIS — C642 Malignant neoplasm of left kidney, except renal pelvis: Secondary | ICD-10-CM

## 2019-08-12 ENCOUNTER — Other Ambulatory Visit: Payer: Self-pay

## 2019-08-12 ENCOUNTER — Inpatient Hospital Stay: Payer: Self-pay

## 2019-08-12 DIAGNOSIS — C642 Malignant neoplasm of left kidney, except renal pelvis: Secondary | ICD-10-CM

## 2019-08-12 LAB — BASIC METABOLIC PANEL
Anion gap: 8 (ref 5–15)
BUN: 39 mg/dL — ABNORMAL HIGH (ref 6–20)
CO2: 23 mmol/L (ref 22–32)
Calcium: 9.1 mg/dL (ref 8.9–10.3)
Chloride: 105 mmol/L (ref 98–111)
Creatinine, Ser: 1.99 mg/dL — ABNORMAL HIGH (ref 0.61–1.24)
GFR calc Af Amer: 42 mL/min — ABNORMAL LOW (ref >60)
GFR calc non Af Amer: 36 mL/min — ABNORMAL LOW (ref >60)
Glucose, Bld: 123 mg/dL — ABNORMAL HIGH (ref 70–99)
Potassium: 5 mmol/L (ref 3.5–5.1)
Sodium: 136 mmol/L (ref 135–145)

## 2019-08-23 ENCOUNTER — Encounter: Payer: Self-pay | Admitting: Oncology

## 2019-08-23 ENCOUNTER — Inpatient Hospital Stay (HOSPITAL_BASED_OUTPATIENT_CLINIC_OR_DEPARTMENT_OTHER): Payer: Self-pay | Admitting: Oncology

## 2019-08-23 ENCOUNTER — Inpatient Hospital Stay: Payer: Self-pay

## 2019-08-23 ENCOUNTER — Inpatient Hospital Stay: Payer: Self-pay | Attending: Oncology

## 2019-08-23 ENCOUNTER — Other Ambulatory Visit: Payer: Self-pay

## 2019-08-23 VITALS — BP 121/79 | HR 62 | Temp 98.2°F | Resp 16 | Wt 161.8 lb

## 2019-08-23 DIAGNOSIS — Z5111 Encounter for antineoplastic chemotherapy: Secondary | ICD-10-CM

## 2019-08-23 DIAGNOSIS — N1832 Chronic kidney disease, stage 3b: Secondary | ICD-10-CM

## 2019-08-23 DIAGNOSIS — C642 Malignant neoplasm of left kidney, except renal pelvis: Secondary | ICD-10-CM

## 2019-08-23 DIAGNOSIS — I130 Hypertensive heart and chronic kidney disease with heart failure and stage 1 through stage 4 chronic kidney disease, or unspecified chronic kidney disease: Secondary | ICD-10-CM | POA: Insufficient documentation

## 2019-08-23 DIAGNOSIS — I5042 Chronic combined systolic (congestive) and diastolic (congestive) heart failure: Secondary | ICD-10-CM | POA: Insufficient documentation

## 2019-08-23 DIAGNOSIS — D631 Anemia in chronic kidney disease: Secondary | ICD-10-CM

## 2019-08-23 DIAGNOSIS — Z5112 Encounter for antineoplastic immunotherapy: Secondary | ICD-10-CM | POA: Insufficient documentation

## 2019-08-23 DIAGNOSIS — G939 Disorder of brain, unspecified: Secondary | ICD-10-CM

## 2019-08-23 DIAGNOSIS — B690 Cysticercosis of central nervous system: Secondary | ICD-10-CM | POA: Insufficient documentation

## 2019-08-23 LAB — COMPREHENSIVE METABOLIC PANEL
ALT: 24 U/L (ref 0–44)
AST: 26 U/L (ref 15–41)
Albumin: 3.9 g/dL (ref 3.5–5.0)
Alkaline Phosphatase: 110 U/L (ref 38–126)
Anion gap: 9 (ref 5–15)
BUN: 41 mg/dL — ABNORMAL HIGH (ref 6–20)
CO2: 25 mmol/L (ref 22–32)
Calcium: 9 mg/dL (ref 8.9–10.3)
Chloride: 104 mmol/L (ref 98–111)
Creatinine, Ser: 2.24 mg/dL — ABNORMAL HIGH (ref 0.61–1.24)
GFR calc Af Amer: 36 mL/min — ABNORMAL LOW (ref >60)
GFR calc non Af Amer: 31 mL/min — ABNORMAL LOW (ref >60)
Glucose, Bld: 146 mg/dL — ABNORMAL HIGH (ref 70–99)
Potassium: 5.3 mmol/L — ABNORMAL HIGH (ref 3.5–5.1)
Sodium: 138 mmol/L (ref 135–145)
Total Bilirubin: 0.6 mg/dL (ref 0.3–1.2)
Total Protein: 7.2 g/dL (ref 6.5–8.1)

## 2019-08-23 LAB — CBC WITH DIFFERENTIAL/PLATELET
Abs Immature Granulocytes: 0.03 10*3/uL (ref 0.00–0.07)
Basophils Absolute: 0 10*3/uL (ref 0.0–0.1)
Basophils Relative: 0 %
Eosinophils Absolute: 0.3 10*3/uL (ref 0.0–0.5)
Eosinophils Relative: 4 %
HCT: 40.1 % (ref 39.0–52.0)
Hemoglobin: 13.5 g/dL (ref 13.0–17.0)
Immature Granulocytes: 0 %
Lymphocytes Relative: 24 %
Lymphs Abs: 2 10*3/uL (ref 0.7–4.0)
MCH: 29.1 pg (ref 26.0–34.0)
MCHC: 33.7 g/dL (ref 30.0–36.0)
MCV: 86.4 fL (ref 80.0–100.0)
Monocytes Absolute: 0.8 10*3/uL (ref 0.1–1.0)
Monocytes Relative: 10 %
Neutro Abs: 5 10*3/uL (ref 1.7–7.7)
Neutrophils Relative %: 62 %
Platelets: 150 10*3/uL (ref 150–400)
RBC: 4.64 MIL/uL (ref 4.22–5.81)
RDW: 12.9 % (ref 11.5–15.5)
WBC: 8.1 10*3/uL (ref 4.0–10.5)
nRBC: 0 % (ref 0.0–0.2)

## 2019-08-23 LAB — TSH: TSH: 3.421 u[IU]/mL (ref 0.350–4.500)

## 2019-08-23 MED ORDER — SODIUM CHLORIDE 0.9 % IV SOLN
200.0000 mg | Freq: Once | INTRAVENOUS | Status: AC
  Administered 2019-08-23: 200 mg via INTRAVENOUS
  Filled 2019-08-23: qty 8

## 2019-08-23 MED ORDER — SODIUM CHLORIDE 0.9 % IV SOLN
Freq: Once | INTRAVENOUS | Status: AC
  Filled 2019-08-23: qty 250

## 2019-08-23 NOTE — Progress Notes (Signed)
Hematology/Oncology Progress Note [facility] Telephone:([phone] [phone]  Patient Care Team: Earlie Server, MD as PCP - General (Oncology)   Name of the patient: Mark Donaldson  [id number]  27-Jan-1960  Date of visit: 08/23/19   INTERVAL HISTORY-  59 y.o. male presents for follow-up of renal cell carcinoma treatments. Patient has past medical history CAD status post PCI to LAD in 2018, on aspirin and Plavix, CHF, history of focal sclerosing/crescentic GN-ANCA positive and CKD stage III. He was hospitalized from 05/02/2019-05/07/2019 due to gross hematuria. CT showed a 7.4 cm left lower pole renal mass concerning for RCC.  Patient also has necrotic lymphadenopathy in the mediastinum and the right supra hilar region which were highly suspicious for metastatic disease.  Bilateral lung nodules, also concerning for metastatic lung disease.  Right retrocrural lymphadenopathy with upper normal left periaortic lymph node. Patient was recommended for cytoreductive radical nephrectomy.  With history of ANCA positive vasculitis, also recommend thoracic lymphadenopathy biopsy to confirm distal metastasis.  Patient agreed to radical nephrectomy and declined bronchoscopy biopsy. He underwent left radical nephrectomy on 05/05/2019. Pathology showed pT3a pNx, RCC, conventional clear cell type, grade 3 Patient was discharged home. Today he presents to discuss pathology and the management plan. He was accompanied by his wife and daughter. Patient reports that he uses hydrocodone for pain.  Pain around the surgical site has improved.  He occasionally has tingling sensation around the surgical site.  Denies any fever or chills, drainage from the surgical site.  INTERVAL HISTORY Mark Donaldson is a 59 y.o. male who has above history reviewed by me today presents for follow up visit for management of metastatic RCC Problems and complaints are listed  below: Patient is Spanish-speaking.  Spanish interpreter present for the entire encounter for translation Patient resumed on Glenvil 3 weeks ago. During interval her kidney function has improved. Today he has no new complaints.  mild chronic intermittent right lower back pain.   Review of systems- Review of Systems  Constitutional: Negative for appetite change, chills, fatigue, fever and unexpected weight change.  HENT:   Negative for hearing loss and voice change.   Eyes: Negative for eye problems and icterus.  Respiratory: Negative for chest tightness, cough and shortness of breath.   Cardiovascular: Negative for chest pain and leg swelling.  Gastrointestinal: Negative for abdominal distention and abdominal pain.  Endocrine: Negative for hot flashes.  Genitourinary: Negative for difficulty urinating, dysuria and frequency.   Musculoskeletal: Negative for arthralgias.  Skin: Negative for itching and rash.  Neurological: Negative for light-headedness and numbness.  Hematological: Negative for adenopathy. Does not bruise/bleed easily.  Psychiatric/Behavioral: Negative for confusion.    No Known Allergies  Patient Active Problem List   Diagnosis Date Noted  . Encounter for antineoplastic immunotherapy 07/19/2019  . Encounter for antineoplastic chemotherapy 07/19/2019  . CNS lesion 07/07/2019  . Anemia in stage 3b chronic kidney disease 06/07/2019  . Goals of care, counseling/discussion 05/23/2019  . Renal cell carcinoma of left kidney ([place]) 05/23/2019  . Renal cell carcinoma ([place]) 05/23/2019  . Palliative care encounter   . History of vasculitis   . Thoracic lymphadenopathy   . Lung nodule   . CAD S/P percutaneous coronary angioplasty 05/02/2019  . Kidney mass 05/02/2019  . Hydronephrosis of left kidney 05/02/2019  . UTI (urinary tract infection) 05/02/2019  . AKI (acute kidney injury) ([place]) 05/02/2019  . Gross hematuria 05/02/2019  . Chronic diastolic CHF (congestive heart  failure) ([place]) 05/02/2019  .  Sepsis ([place]) 05/02/2019  . Severe sepsis ([place]) 05/02/2019  . Preop cardiovascular exam   . Hyperlipidemia 05/21/2016  . 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 Prediabetes 05/21/2016  . Alcohol abuse 05/18/2016  . Essential hypertension 05/18/2016  . Vasculitis, 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[place]) 05/18/2016  . Shortness of breath 05/17/2016     Past Medical History:  Diagnosis Date  . ANCA-positive vasculitis ([place])   . CAD (coronary artery disease)   . Essential hypertension   . HFrEF (heart failure with reduced ejection fraction) ([place])   . Hyperlipidemia LDL goal <70   . Ischemic cardiomyopathy   . Renal cell carcinoma ([place]) 05/23/2019  . Renal disorder      Past Surgical History:  Procedure Laterality Date  . CARDIAC SURGERY     cardiac cath with 2 stent placement  . LAPAROSCOPIC NEPHRECTOMY, HAND ASSISTED Left 05/05/2019   Procedure: HAND ASSISTED LAPAROSCOPIC NEPHRECTOMY;  Surgeon: Hollice Espy, MD;  Location: [facility] ORS;  Service: Urology;  Laterality: Left;    Social History   Socioeconomic History  . Marital status: Married    Spouse name: Not on file  . Number of children: Not on file  . Years of education: Not on file  . Highest education level: Not on file  Occupational History  . Not on file  Tobacco Use  . Smoking status: Never Smoker  . Smokeless tobacco: Never Used  Substance and Sexual Activity  . Alcohol use: Yes  . Drug use: Not on file  . Sexual activity: Not on file  Other Topics Concern  . Not on file  Social History Narrative  . Not on file   Social Determinants of Health   Financial Resource Strain:   . Difficulty of Paying Living Expenses:   Food Insecurity:   . Worried About Charity fundraiser in the Last Year:   . Arboriculturist in the Last Year:   Transportation Needs:   . Film/video editor (Medical):   Marland Kitchen Lack of Transportation (Non-Medical):   Physical Activity:   . Days  of Exercise per Week:   . Minutes of Exercise per Session:   Stress:   . Feeling of Stress :   Social Connections:   . Frequency of Communication with Friends and Family:   . Frequency of Social Gatherings with Friends and Family:   . Attends Religious Services:   . Active Member of Clubs or Organizations:   . Attends Archivist Meetings:   Marland Kitchen Marital Status:   Intimate Partner Violence:   . Fear of Current or Ex-Partner:   . Emotionally Abused:   Marland Kitchen Physically Abused:   . Sexually Abused:      Family History  Problem Relation Age of Onset  . Cancer Mother   . Blindness Brother      Current Outpatient Medications:  .  amLODipine (NORVASC) 10 MG tablet, Take 1 tablet (10 mg total) by mouth daily., Disp: 90 tablet, Rfl: 1 .  aspirin 81 MG chewable tablet, Chew 1 tablet by mouth daily., Disp: , Rfl:  .  atorvastatin (LIPITOR) 80 MG tablet, Take 80 mg by mouth daily., Disp: , Rfl:  .  axitinib (INLYTA) 5 MG tablet, Take 1 tablet (5 mg total) by mouth 2 (two) times daily. (Patient not taking: Reported on 08/02/2019), Disp: 60 tablet, Rfl: 0 .  clopidogrel (PLAVIX) 75 MG tablet, Take 1 tablet by mouth daily., Disp: , Rfl:  .  furosemide (LASIX)  20 MG tablet, Take 1 tablet by mouth every morning (Patient not taking: Reported on 08/02/2019), Disp: , Rfl:  .  HYDROcodone-acetaminophen (NORCO/VICODIN) 5-325 MG tablet, Take 1 tablet by mouth every 4 (four) hours as needed for moderate pain., Disp: 12 tablet, Rfl: 0 .  lisinopril (ZESTRIL) 10 MG tablet, Take 1 tablet by mouth daily., Disp: , Rfl:  .  melatonin 5 MG TABS, Take 1 tablet (5 mg total) by mouth at bedtime as needed. (Patient not taking: Reported on 08/02/2019), Disp: 30 tablet, Rfl: 0 .  metoprolol succinate (TOPROL-XL) 100 MG 24 hr tablet, Take 1.5 tablets by mouth daily., Disp: , Rfl:    Physical exam:  Vitals:   08/23/19 0909  BP: 121/79  Pulse: 62  Resp: 16  Temp: 98.2 F (36.8 C)  Weight: 161 lb 12.8 oz (73.4  kg)   Physical Exam Constitutional:      General: He is not in acute distress.    Appearance: He is not diaphoretic.  HENT:     Head: Normocephalic and atraumatic.     Nose: Nose normal.     Mouth/Throat:     Pharynx: No oropharyngeal exudate.  Eyes:     General: No scleral icterus.    Pupils: Pupils are equal, round, and reactive to light.  Cardiovascular:     Rate and Rhythm: Normal rate and regular rhythm.     Heart sounds: No murmur heard.   Pulmonary:     Effort: Pulmonary effort is normal. No respiratory distress.     Breath sounds: No rales.  Chest:     Chest wall: No tenderness.  Abdominal:     General: There is no distension.     Palpations: Abdomen is soft.     Tenderness: There is no abdominal tenderness.  Musculoskeletal:        General: Normal range of motion.     Cervical back: Normal range of motion and neck supple.  Skin:    General: Skin is warm and dry.     Findings: No erythema.     Comments: 1-2 bruises on upper extremities.  Neurological:     Mental Status: He is alert and oriented to person, place, and time.     Cranial Nerves: No cranial nerve deficit.     Motor: No abnormal muscle tone.     Coordination: Coordination normal.  Psychiatric:        Mood and Affect: Affect normal.        CMP Latest Ref Rng & Units 08/12/2019  Glucose 70 - 99 mg/dL 123(H)  BUN 6 - 20 mg/dL 39(H)  Creatinine 0.61 - 1.24 mg/dL 1.99(H)  Sodium 135 - 145 mmol/L 136  Potassium 3.5 - 5.1 mmol/L 5.0  Chloride 98 - 111 mmol/L 105  CO2 22 - 32 mmol/L 23  Calcium 8.9 - 10.3 mg/dL 9.1  Total Protein 6.5 - 8.1 g/dL -  Total Bilirubin 0.3 - 1.2 mg/dL -  Alkaline Phos 38 - 126 U/L -  AST 15 - 41 U/L -  ALT 0 - 44 U/L -   CBC Latest Ref Rng & Units 08/02/2019  WBC 4.0 - 10.5 K/uL 8.9  Hemoglobin 13.0 - 17.0 g/dL 14.1  Hematocrit 39 - 52 % 40.9  Platelets 150 - 400 K/uL 167    RADIOGRAPHIC STUDIES: I have personally reviewed the radiological images as listed and  agreed with the findings in the report. DG Ribs Unilateral W/Chest Right  Result Date: 08/04/2019 CLINICAL DATA:  Right rib pain EXAM: RIGHT RIBS AND CHEST - 3+ VIEW COMPARISON:  07/11/2007, CT 05/03/2019 FINDINGS: Two view radiograph of the ribs demonstrates no fracture or destructive osseous lesion. Nodular density noted at the right lung base corresponds to the pulmonary nodule seen on prior CT examination in keeping with the known history of metastatic renal cell carcinoma. Minimal left basilar atelectasis or scarring. No pneumothorax or pleural effusion. Cardiac size within normal limits. IMPRESSION: No acute right rib fracture or lytic lesion. Right basilar pulmonary nodule in keeping with known metastatic renal cell carcinoma. Electronically Signed   By: Fidela Salisbury MD   On: 08/04/2019 16:50   US RENAL  Result Date: 07/26/2019 CLINICAL DATA:  Acute kidney injury. History of left nephrectomy for renal cell carcinoma. EXAM: RENAL / URINARY TRACT ULTRASOUND COMPLETE COMPARISON:  Abdominopelvic CT 05/03/2019. Abdominal ultrasound 07/23/2007. FINDINGS: Right Kidney: Renal measurements: 9.6 x 5.6 x 5.2 cm = volume: 148.4 mL. Mildly increased cortical echogenicity without focal abnormality. No hydronephrosis. Left Kidney: Surgically absent.  No mass identified in the nephrectomy bed. Bladder: Appears normal for the degree of distention. A right ureteral jet is visualized. Other: None. IMPRESSION: Mildly increased right renal cortical echogenicity suspicious for medical renal disease. No hydronephrosis or focal abnormality identified. Previous left nephrectomy. Electronically Signed   By: Richardean Sale M.D.   On: 07/26/2019 13:44    Assessment and plan-  Patient is a 59 y.o. male history of CAD, status post stent, CHF, history of ANCA positive sclerosing/crescentic GN, chronic kidney disease stage III presents for treatments of stage IV renal cell carcinoma 1. Renal cell carcinoma of left kidney  (HCC)   2. Anemia in stage 3b chronic kidney disease   3. Encounter for antineoplastic immunotherapy   4. Encounter for antineoplastic chemotherapy   5. Brain lesion    Stage IV renal cell carcinoma Labs are reviewed and discussed patient Proceed with Keytruda treatment today. Repeat BMP in 1 week. If kidney function is stable, will resume axitinib. We will obtain PET scan for evaluation of treatment response.  #Chronic kidney disease, There have been fluctuation of his kidney function. Patient has an appointment to see Dr. Candiss Norse nephrology for further evaluation   #History of diastolic CHF, 3/54/5625 LVEF 60 to 65%.  Lasix was held due to worsening of kidney function. Continue lisinopril and metoprolol.  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 Steva Ready.  Pending work-up, blood test to be done at [facility].Per ID, patient will need to have ophthalmology examination.  Patient has had ophthalmology evaluation.  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  We spent sufficient time to discuss many aspect of care, questions were answered to patient's satisfaction. Follow-up in 3 week for follow-up of kidney function and evaluation prior to immunotherapy. Thank you for allowing me to participate in the care of this patient.   Earlie Server, MD, PhD Hematology Oncology [facility] Pager- [phone] 08/23/2019

## 2019-08-23 NOTE — Progress Notes (Signed)
Patient reports occasional right side back pain.

## 2019-08-30 ENCOUNTER — Inpatient Hospital Stay: Payer: Self-pay

## 2019-08-30 ENCOUNTER — Other Ambulatory Visit: Payer: Self-pay

## 2019-08-30 DIAGNOSIS — C642 Malignant neoplasm of left kidney, except renal pelvis: Secondary | ICD-10-CM

## 2019-08-30 LAB — BASIC METABOLIC PANEL
Anion gap: 7 (ref 5–15)
BUN: 38 mg/dL — ABNORMAL HIGH (ref 6–20)
CO2: 25 mmol/L (ref 22–32)
Calcium: 9.4 mg/dL (ref 8.9–10.3)
Chloride: 107 mmol/L (ref 98–111)
Creatinine, Ser: 1.99 mg/dL — ABNORMAL HIGH (ref 0.61–1.24)
GFR calc Af Amer: 42 mL/min — ABNORMAL LOW (ref >60)
GFR calc non Af Amer: 36 mL/min — ABNORMAL LOW (ref >60)
Glucose, Bld: 121 mg/dL — ABNORMAL HIGH (ref 70–99)
Potassium: 4.9 mmol/L (ref 3.5–5.1)
Sodium: 139 mmol/L (ref 135–145)

## 2019-09-08 ENCOUNTER — Encounter
Admission: RE | Admit: 2019-09-08 | Discharge: 2019-09-08 | Disposition: A | Discharge status: Home / Self Care | Payer: Self-pay | Source: Ambulatory Visit | Attending: Oncology | Admitting: Oncology

## 2019-09-08 ENCOUNTER — Other Ambulatory Visit: Payer: Self-pay

## 2019-09-08 DIAGNOSIS — C642 Malignant neoplasm of left kidney, except renal pelvis: Secondary | ICD-10-CM | POA: Insufficient documentation

## 2019-09-08 LAB — GLUCOSE, CAPILLARY: Glucose-Capillary: 100 mg/dL — ABNORMAL HIGH (ref 70–99)

## 2019-09-08 MED ORDER — FLUDEOXYGLUCOSE F - 18 (FDG) INJECTION
8.3000 | Freq: Once | INTRAVENOUS | Status: AC | PRN
  Administered 2019-09-08: 8.8 via INTRAVENOUS

## 2019-09-13 ENCOUNTER — Inpatient Hospital Stay (HOSPITAL_BASED_OUTPATIENT_CLINIC_OR_DEPARTMENT_OTHER): Payer: Self-pay | Admitting: Oncology

## 2019-09-13 ENCOUNTER — Inpatient Hospital Stay: Payer: Self-pay

## 2019-09-13 ENCOUNTER — Other Ambulatory Visit: Payer: Self-pay

## 2019-09-13 ENCOUNTER — Encounter: Payer: Self-pay | Admitting: Oncology

## 2019-09-13 VITALS — BP 124/79 | HR 66 | Temp 98.5°F | Wt 159.6 lb

## 2019-09-13 DIAGNOSIS — C642 Malignant neoplasm of left kidney, except renal pelvis: Secondary | ICD-10-CM

## 2019-09-13 DIAGNOSIS — D631 Anemia in chronic kidney disease: Secondary | ICD-10-CM

## 2019-09-13 DIAGNOSIS — G939 Disorder of brain, unspecified: Secondary | ICD-10-CM

## 2019-09-13 DIAGNOSIS — N1832 Chronic kidney disease, stage 3b: Secondary | ICD-10-CM

## 2019-09-13 DIAGNOSIS — Z5112 Encounter for antineoplastic immunotherapy: Secondary | ICD-10-CM

## 2019-09-13 LAB — CBC WITH DIFFERENTIAL/PLATELET
Abs Immature Granulocytes: 0.02 10*3/uL (ref 0.00–0.07)
Basophils Absolute: 0 10*3/uL (ref 0.0–0.1)
Basophils Relative: 1 %
Eosinophils Absolute: 0.3 10*3/uL (ref 0.0–0.5)
Eosinophils Relative: 4 %
HCT: 40.3 % (ref 39.0–52.0)
Hemoglobin: 13.8 g/dL (ref 13.0–17.0)
Immature Granulocytes: 0 %
Lymphocytes Relative: 26 %
Lymphs Abs: 2 10*3/uL (ref 0.7–4.0)
MCH: 29 pg (ref 26.0–34.0)
MCHC: 34.2 g/dL (ref 30.0–36.0)
MCV: 84.7 fL (ref 80.0–100.0)
Monocytes Absolute: 0.8 10*3/uL (ref 0.1–1.0)
Monocytes Relative: 10 %
Neutro Abs: 4.5 10*3/uL (ref 1.7–7.7)
Neutrophils Relative %: 59 %
Platelets: 157 10*3/uL (ref 150–400)
RBC: 4.76 MIL/uL (ref 4.22–5.81)
RDW: 13.2 % (ref 11.5–15.5)
WBC: 7.7 10*3/uL (ref 4.0–10.5)
nRBC: 0 % (ref 0.0–0.2)

## 2019-09-13 LAB — COMPREHENSIVE METABOLIC PANEL
ALT: 21 U/L (ref 0–44)
AST: 24 U/L (ref 15–41)
Albumin: 4.2 g/dL (ref 3.5–5.0)
Alkaline Phosphatase: 83 U/L (ref 38–126)
Anion gap: 9 (ref 5–15)
BUN: 40 mg/dL — ABNORMAL HIGH (ref 6–20)
CO2: 22 mmol/L (ref 22–32)
Calcium: 9 mg/dL (ref 8.9–10.3)
Chloride: 108 mmol/L (ref 98–111)
Creatinine, Ser: 2.12 mg/dL — ABNORMAL HIGH (ref 0.61–1.24)
GFR calc Af Amer: 39 mL/min — ABNORMAL LOW (ref >60)
GFR calc non Af Amer: 34 mL/min — ABNORMAL LOW (ref >60)
Glucose, Bld: 104 mg/dL — ABNORMAL HIGH (ref 70–99)
Potassium: 4.4 mmol/L (ref 3.5–5.1)
Sodium: 139 mmol/L (ref 135–145)
Total Bilirubin: 0.7 mg/dL (ref 0.3–1.2)
Total Protein: 7.2 g/dL (ref 6.5–8.1)

## 2019-09-13 MED ORDER — SODIUM CHLORIDE 0.9 % IV SOLN
200.0000 mg | Freq: Once | INTRAVENOUS | Status: AC
  Administered 2019-09-13: 200 mg via INTRAVENOUS
  Filled 2019-09-13: qty 8

## 2019-09-13 MED ORDER — SODIUM CHLORIDE 0.9 % IV SOLN
Freq: Once | INTRAVENOUS | Status: AC
  Filled 2019-09-13: qty 250

## 2019-09-13 NOTE — Progress Notes (Signed)
Pt here for follow up. No new concerns voiced.   

## 2019-09-13 NOTE — Progress Notes (Signed)
Hematology/Oncology Progress Note [facility] [phone] [phone]  Patient Care Team: Earlie Server, MD as PCP - General (Oncology)   Name of the patient: Mark Donaldson  [id number]  1960/06/13  Date of visit: 09/13/19   INTERVAL HISTORY-  59 y.o. male presents for follow-up of renal cell carcinoma treatments. Patient has past medical history CAD status post PCI to LAD in 2018, on aspirin and Plavix, CHF, history of focal sclerosing/crescentic GN-ANCA positive and CKD stage III. He was hospitalized from 05/02/2019-05/07/2019 due to gross hematuria. CT showed a 7.4 cm left lower pole renal mass concerning for RCC.  Patient also has necrotic lymphadenopathy in the mediastinum and the right supra hilar region which were highly suspicious for metastatic disease.  Bilateral lung nodules, also concerning for metastatic lung disease.  Right retrocrural lymphadenopathy with upper normal left periaortic lymph node. Patient was recommended for cytoreductive radical nephrectomy.  With history of ANCA positive vasculitis, also recommend thoracic lymphadenopathy biopsy to confirm distal metastasis.  Patient agreed to radical nephrectomy and declined bronchoscopy biopsy. He underwent left radical nephrectomy on 05/05/2019. Pathology showed pT3a pNx, RCC, conventional clear cell type, grade 3 Patient was discharged home. Today he presents to discuss pathology and the management plan. He was accompanied by his wife and daughter. Patient reports that he uses hydrocodone for pain.  Pain around the surgical site has improved.  He occasionally has tingling sensation around the surgical site.  Denies any fever or chills, drainage from the surgical site.  INTERVAL HISTORY Mark Donaldson is a 59 y.o. male who has above history reviewed by me today presents for follow up visit for management of metastatic RCC Problems and complaints are listed  below: Patient is Spanish-speaking.  Spanish interpreter present for the entire encounter for translation Patient had PET scan done during interval. He reports doing well.  No new complaints.    Review of systems- Review of Systems  Constitutional: Negative for appetite change, chills, fatigue, fever and unexpected weight change.  HENT:   Negative for hearing loss and voice change.   Eyes: Negative for eye problems and icterus.  Respiratory: Negative for chest tightness, cough and shortness of breath.   Cardiovascular: Negative for chest pain and leg swelling.  Gastrointestinal: Negative for abdominal distention and abdominal pain.  Endocrine: Negative for hot flashes.  Genitourinary: Negative for difficulty urinating, dysuria and frequency.   Musculoskeletal: Negative for arthralgias.  Skin: Negative for itching and rash.  Neurological: Negative for light-headedness and numbness.  Hematological: Negative for adenopathy. Does not bruise/bleed easily.  Psychiatric/Behavioral: Negative for confusion.    No Known Allergies  Patient Active Problem List   Diagnosis Date Noted  . Encounter for antineoplastic immunotherapy 07/19/2019  . Encounter for antineoplastic chemotherapy 07/19/2019  . CNS lesion 07/07/2019  . Anemia in stage 3b chronic kidney disease 06/07/2019  . Goals of care, counseling/discussion 05/23/2019  . Renal cell carcinoma of left kidney ([place]) 05/23/2019  . Renal cell carcinoma ([place]) 05/23/2019  . Palliative care encounter   . History of vasculitis   . Thoracic lymphadenopathy   . Lung nodule   . CAD S/P percutaneous coronary angioplasty 05/02/2019  . Kidney mass 05/02/2019  . Hydronephrosis of left kidney 05/02/2019  . UTI (urinary tract infection) 05/02/2019  . AKI (acute kidney injury) ([place]) 05/02/2019  . Gross hematuria 05/02/2019  . Chronic diastolic CHF (congestive heart failure) ([place]) 05/02/2019  . Sepsis ([place]) 05/02/2019  . Severe sepsis ([place])  05/02/2019  .  Preop cardiovascular exam   . Hyperlipidemia 05/21/2016  . 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 Prediabetes 05/21/2016  . Alcohol abuse 05/18/2016  . Essential hypertension 05/18/2016  . Vasculitis, 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[place]) 05/18/2016  . Shortness of breath 05/17/2016     Past Medical History:  Diagnosis Date  . ANCA-positive vasculitis ([place])   . CAD (coronary artery disease)   . Essential hypertension   . HFrEF (heart failure with reduced ejection fraction) ([place])   . Hyperlipidemia LDL goal <70   . Ischemic cardiomyopathy   . Renal cell carcinoma ([place]) 05/23/2019  . Renal disorder      Past Surgical History:  Procedure Laterality Date  . CARDIAC SURGERY     cardiac cath with 2 stent placement  . LAPAROSCOPIC NEPHRECTOMY, HAND ASSISTED Left 05/05/2019   Procedure: HAND ASSISTED LAPAROSCOPIC NEPHRECTOMY;  Surgeon: Hollice Espy, MD;  Location: [facility] ORS;  Service: Urology;  Laterality: Left;    Social History   Socioeconomic History  . Marital status: Married    Spouse name: Not on file  . Number of children: Not on file  . Years of education: Not on file  . Highest education level: Not on file  Occupational History  . Not on file  Tobacco Use  . Smoking status: Never Smoker  . Smokeless tobacco: Never Used  Substance and Sexual Activity  . Alcohol use: Yes  . Drug use: Not on file  . Sexual activity: Not on file  Other Topics Concern  . Not on file  Social History Narrative  . Not on file   Social Determinants of Health   Financial Resource Strain:   . Difficulty of Paying Living Expenses: Not on file  Food Insecurity:   . Worried About Charity fundraiser in the Last Year: Not on file  . Ran Out of Food in the Last Year: Not on file  Transportation Needs:   . Lack of Transportation (Medical): Not on file  . Lack of Transportation (Non-Medical): Not on file  Physical Activity:   . Days of Exercise per Week:  Not on file  . Minutes of Exercise per Session: Not on file  Stress:   . Feeling of Stress : Not on file  Social Connections:   . Frequency of Communication with Friends and Family: Not on file  . Frequency of Social Gatherings with Friends and Family: Not on file  . Attends Religious Services: Not on file  . Active Member of Clubs or Organizations: Not on file  . Attends Archivist Meetings: Not on file  . Marital Status: Not on file  Intimate Partner Violence:   . Fear of Current or Ex-Partner: Not on file  . Emotionally Abused: Not on file  . Physically Abused: Not on file  . Sexually Abused: Not on file     Family History  Problem Relation Age of Onset  . Cancer Mother   . Blindness Brother      Current Outpatient Medications:  .  amLODipine (NORVASC) 10 MG tablet, Take 1 tablet (10 mg total) by mouth daily., Disp: 90 tablet, Rfl: 1 .  aspirin 81 MG chewable tablet, Chew 1 tablet by mouth daily., Disp: , Rfl:  .  atorvastatin (LIPITOR) 80 MG tablet, Take 80 mg by mouth daily., Disp: , Rfl:  .  clopidogrel (PLAVIX) 75 MG tablet, Take 1 tablet by mouth daily., Disp: , Rfl:  .  lisinopril (ZESTRIL) 10 MG tablet, Take 1  tablet by mouth daily., Disp: , Rfl:  .  metoprolol succinate (TOPROL-XL) 100 MG 24 hr tablet, Take 1.5 tablets by mouth daily., Disp: , Rfl:  .  axitinib (INLYTA) 5 MG tablet, Take 1 tablet (5 mg total) by mouth 2 (two) times daily. (Patient not taking: Reported on 08/02/2019), Disp: 60 tablet, Rfl: 0 .  furosemide (LASIX) 20 MG tablet, Take 1 tablet by mouth every morning (Patient not taking: Reported on 08/02/2019), Disp: , Rfl:  .  HYDROcodone-acetaminophen (NORCO/VICODIN) 5-325 MG tablet, Take 1 tablet by mouth every 4 (four) hours as needed for moderate pain. (Patient not taking: Reported on 08/23/2019), Disp: 12 tablet, Rfl: 0 .  melatonin 5 MG TABS, Take 1 tablet (5 mg total) by mouth at bedtime as needed. (Patient not taking: Reported on 08/02/2019),  Disp: 30 tablet, Rfl: 0   Physical exam:  Vitals:   09/13/19 0909  BP: 124/79  Pulse: 66  Temp: 98.5 F (36.9 C)  TempSrc: Tympanic  SpO2: 98%  Weight: 159 lb 9.6 oz (72.4 kg)   Physical Exam Constitutional:      General: He is not in acute distress.    Appearance: He is not diaphoretic.  HENT:     Head: Normocephalic and atraumatic.     Nose: Nose normal.     Mouth/Throat:     Pharynx: No oropharyngeal exudate.  Eyes:     General: No scleral icterus.    Pupils: Pupils are equal, round, and reactive to light.  Cardiovascular:     Rate and Rhythm: Normal rate and regular rhythm.     Heart sounds: No murmur heard.   Pulmonary:     Effort: Pulmonary effort is normal. No respiratory distress.     Breath sounds: No rales.  Chest:     Chest wall: No tenderness.  Abdominal:     General: There is no distension.     Palpations: Abdomen is soft.     Tenderness: There is no abdominal tenderness.  Musculoskeletal:        General: Normal range of motion.     Cervical back: Normal range of motion and neck supple.  Skin:    General: Skin is warm and dry.     Findings: No erythema.  Neurological:     Mental Status: He is alert and oriented to person, place, and time.     Cranial Nerves: No cranial nerve deficit.     Motor: No abnormal muscle tone.     Coordination: Coordination normal.  Psychiatric:        Mood and Affect: Affect normal.        CMP Latest Ref Rng & Units 09/13/2019  Glucose 70 - 99 mg/dL 104(H)  BUN 6 - 20 mg/dL 40(H)  Creatinine 0.61 - 1.24 mg/dL 2.12(H)  Sodium 135 - 145 mmol/L 139  Potassium 3.5 - 5.1 mmol/L 4.4  Chloride 98 - 111 mmol/L 108  CO2 22 - 32 mmol/L 22  Calcium 8.9 - 10.3 mg/dL 9.0  Total Protein 6.5 - 8.1 g/dL 7.2  Total Bilirubin 0.3 - 1.2 mg/dL 0.7  Alkaline Phos 38 - 126 U/L 83  AST 15 - 41 U/L 24  ALT 0 - 44 U/L 21   CBC Latest Ref Rng & Units 09/13/2019  WBC 4.0 - 10.5 K/uL 7.7  Hemoglobin 13.0 - 17.0 g/dL 13.8  Hematocrit  39 - 52 % 40.3  Platelets 150 - 400 K/uL 157    RADIOGRAPHIC STUDIES: I have personally reviewed  the radiological images as listed and agreed with the findings in the report. NM PET Image Initial (PI) Skull Base To Thigh  Result Date: 09/08/2019 CLINICAL DATA:  Subsequent treatment strategy for renal cell carcinoma. Mediastinal nodal metastasis. EXAM: NUCLEAR MEDICINE PET SKULL BASE TO THIGH TECHNIQUE: 8.8 mCi F-18 FDG was injected intravenously. Full-ring PET imaging was performed from the skull base to thigh after the radiotracer. CT data was obtained and used for attenuation correction and anatomic localization. Fasting blood glucose: 100 mg/dl COMPARISON:  CT 05/03/2019 FINDINGS: Mediastinal blood pool activity: SUV max 2.3 Liver activity: SUV max NA NECK: No hypermetabolic lymph nodes in the neck. Incidental CT findings: none CHEST: Small hypermetabolic nodule along the pleural surface of the anterior mediastinum measures SUV max 6.4. This activity corresponds to a RIGHT pleural base nodule measuring 14 mm x 9 mm on image 90/3. This is at the same site of previously enlarged necrotic lymph nodes on CT 05/03/2019. The majority of the previously seen nodes have regressed with only this solitary hypermetabolic nodule remaining. Incidental CT findings: 9 mm nodule along the RIGHT oblique fissure (image 104/3) is decreased from 16 mm on prior and has very low metabolic activity SUV max equal 1.2. ABDOMEN/PELVIS: Post LEFT nephrectomy anatomy. No hypermetabolic nodularity in the nephrectomy bed. No nodularity on the CT portion exam. No evidence of a task cysts in the abdomen pelvis. Physiologic activity within the stomach. Intense activity in the stomach is favored physiologic. Incidental CT findings: none SKELETON: No focal hypermetabolic activity to suggest skeletal metastasis. Incidental CT findings: none IMPRESSION: 1. In the anterior mediastinum, there is residual hypermetabolic nodule along the RIGHT  pleural surface at site of previous seen multiple metastatic nodules. 2. Interval decrease in size of RIGHT lung pulmonary nodule with minimal metabolic activity. 3. Post LEFT nephrectomy without evidence residual carcinoma. Electronically Signed   By: Suzy Bouchard M.D.   On: 09/08/2019 12:27    Assessment and plan-  Patient is a 59 y.o. male history of CAD, status post stent, CHF, history of ANCA positive sclerosing/crescentic GN, chronic kidney disease stage III presents for treatments of stage IV renal cell carcinoma 1. Renal cell carcinoma of left kidney (HCC)   2. Encounter for antineoplastic immunotherapy   3. Anemia in stage 3b chronic kidney disease   4. Brain lesion   5. Stage 3b chronic kidney disease    # Stage IV renal cell carcinoma Labs are reviewed and discussed with patient. Proceed with Keytruda treatment today.  PET images were reviewed independently and reviewed with patient.  Anterior mediastinum residual hypermetabolism nodule along right pleural surface, interval decrease in the size of right lung pulmonary nodule with minimal metabolic activity, post left nephrectomy without evidence residual carcinoma.  Partial response.  Continue current regimen. Axitinib currently is being held due to worsening of kidney function.  We will check urine protein at the next visit.  Consider resume axitinib in the future if kidney function is stable. Patient will have an appointment with nephrology next week.   #History of diastolic CHF, 5/36/6440 LVEF 60 to 65%.  Lasix was held due to worsening of kidney function. Continue lisinopril and metoprolol.  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 Steva Ready.  Pending work-up, blood test to be done at [facility].Per ID, patient will need to have ophthalmology examination.  Patient has had ophthalmology evaluation.  Defer to infectious disease Dr. Steva Ready for management of his neurocysticercosis lesion to ID.  We spent  sufficient time  to discuss many aspect of care, questions were answered to patient's satisfaction. Follow-up in 3 week for follow-up of kidney function and evaluation prior to immunotherapy. Thank you for allowing me to participate in the care of this patient.   Earlie Server, MD, PhD Hematology Oncology [facility] [address] at [facility] Pager- [phone] 09/13/2019

## 2019-09-22 ENCOUNTER — Other Ambulatory Visit
Admission: RE | Admit: 2019-09-22 | Discharge: 2019-09-22 | Disposition: A | Discharge status: Home / Self Care | Payer: Self-pay | Attending: Nephrology | Admitting: Nephrology

## 2019-09-22 ENCOUNTER — Other Ambulatory Visit: Payer: Self-pay

## 2019-09-22 DIAGNOSIS — N1832 Chronic kidney disease, stage 3b: Secondary | ICD-10-CM | POA: Insufficient documentation

## 2019-09-22 LAB — CBC WITH DIFFERENTIAL/PLATELET
Abs Immature Granulocytes: 0.03 10*3/uL (ref 0.00–0.07)
Basophils Absolute: 0 10*3/uL (ref 0.0–0.1)
Basophils Relative: 1 %
Eosinophils Absolute: 0.2 10*3/uL (ref 0.0–0.5)
Eosinophils Relative: 3 %
HCT: 41.8 % (ref 39.0–52.0)
Hemoglobin: 13.8 g/dL (ref 13.0–17.0)
Immature Granulocytes: 0 %
Lymphocytes Relative: 22 %
Lymphs Abs: 1.6 10*3/uL (ref 0.7–4.0)
MCH: 28.9 pg (ref 26.0–34.0)
MCHC: 33 g/dL (ref 30.0–36.0)
MCV: 87.6 fL (ref 80.0–100.0)
Monocytes Absolute: 0.8 10*3/uL (ref 0.1–1.0)
Monocytes Relative: 11 %
Neutro Abs: 4.7 10*3/uL (ref 1.7–7.7)
Neutrophils Relative %: 63 %
Platelets: 151 10*3/uL (ref 150–400)
RBC: 4.77 MIL/uL (ref 4.22–5.81)
RDW: 13 % (ref 11.5–15.5)
WBC: 7.5 10*3/uL (ref 4.0–10.5)
nRBC: 0 % (ref 0.0–0.2)

## 2019-09-22 LAB — RENAL FUNCTION PANEL
Albumin: 4.4 g/dL (ref 3.5–5.0)
Anion gap: 10 (ref 5–15)
BUN: 41 mg/dL — ABNORMAL HIGH (ref 6–20)
CO2: 24 mmol/L (ref 22–32)
Calcium: 9.5 mg/dL (ref 8.9–10.3)
Chloride: 105 mmol/L (ref 98–111)
Creatinine, Ser: 2.16 mg/dL — ABNORMAL HIGH (ref 0.61–1.24)
GFR calc Af Amer: 38 mL/min — ABNORMAL LOW (ref >60)
GFR calc non Af Amer: 33 mL/min — ABNORMAL LOW (ref >60)
Glucose, Bld: 103 mg/dL — ABNORMAL HIGH (ref 70–99)
Phosphorus: 4.1 mg/dL (ref 2.5–4.6)
Potassium: 5 mmol/L (ref 3.5–5.1)
Sodium: 139 mmol/L (ref 135–145)

## 2019-09-23 LAB — PROTEIN ELECTROPHORESIS, SERUM
A/G Ratio: 1.2 (ref 0.7–1.7)
Albumin ELP: 3.9 g/dL (ref 2.9–4.4)
Alpha-1-Globulin: 0.2 g/dL (ref 0.0–0.4)
Alpha-2-Globulin: 0.9 g/dL (ref 0.4–1.0)
Beta Globulin: 1.1 g/dL (ref 0.7–1.3)
Gamma Globulin: 1.1 g/dL (ref 0.4–1.8)
Globulin, Total: 3.2 g/dL (ref 2.2–3.9)
Total Protein ELP: 7.1 g/dL (ref 6.0–8.5)

## 2019-09-23 LAB — ANCA TITERS
Atypical P-ANCA titer: <1:20 {titer}
C-ANCA: <1:20 {titer}
P-ANCA: <1:20 {titer}

## 2019-09-23 LAB — PARATHYROID HORMONE, INTACT (NO CA): PTH: 30 pg/mL (ref 15–65)

## 2019-09-23 LAB — ANA: Anti Nuclear Antibody (ANA): NEGATIVE

## 2019-09-27 LAB — PROTEIN ELECTRO, RANDOM URINE
Albumin ELP, Urine: 0 %
Alpha-1-Globulin, U: 0 %
Alpha-2-Globulin, U: 0 %
Beta Globulin, U: 0 %
Gamma Globulin, U: 0 %
Total Protein, Urine: <4 mg/dL

## 2019-10-04 ENCOUNTER — Inpatient Hospital Stay (HOSPITAL_BASED_OUTPATIENT_CLINIC_OR_DEPARTMENT_OTHER): Payer: Self-pay | Admitting: Oncology

## 2019-10-04 ENCOUNTER — Inpatient Hospital Stay: Payer: Self-pay | Attending: Oncology

## 2019-10-04 ENCOUNTER — Other Ambulatory Visit: Payer: Self-pay

## 2019-10-04 ENCOUNTER — Encounter: Payer: Self-pay | Admitting: Oncology

## 2019-10-04 ENCOUNTER — Inpatient Hospital Stay: Payer: Self-pay

## 2019-10-04 VITALS — BP 128/84 | HR 61 | Temp 97.4°F | Resp 16 | Wt 161.1 lb

## 2019-10-04 DIAGNOSIS — I130 Hypertensive heart and chronic kidney disease with heart failure and stage 1 through stage 4 chronic kidney disease, or unspecified chronic kidney disease: Secondary | ICD-10-CM | POA: Insufficient documentation

## 2019-10-04 DIAGNOSIS — I2510 Atherosclerotic heart disease of native coronary artery without angina pectoris: Secondary | ICD-10-CM | POA: Insufficient documentation

## 2019-10-04 DIAGNOSIS — R918 Other nonspecific abnormal finding of lung field: Secondary | ICD-10-CM | POA: Insufficient documentation

## 2019-10-04 DIAGNOSIS — Z5112 Encounter for antineoplastic immunotherapy: Secondary | ICD-10-CM

## 2019-10-04 DIAGNOSIS — C642 Malignant neoplasm of left kidney, except renal pelvis: Secondary | ICD-10-CM

## 2019-10-04 DIAGNOSIS — Z905 Acquired absence of kidney: Secondary | ICD-10-CM | POA: Insufficient documentation

## 2019-10-04 DIAGNOSIS — Z5111 Encounter for antineoplastic chemotherapy: Secondary | ICD-10-CM

## 2019-10-04 DIAGNOSIS — N1832 Chronic kidney disease, stage 3b: Secondary | ICD-10-CM | POA: Insufficient documentation

## 2019-10-04 DIAGNOSIS — G939 Disorder of brain, unspecified: Secondary | ICD-10-CM

## 2019-10-04 DIAGNOSIS — C771 Secondary and unspecified malignant neoplasm of intrathoracic lymph nodes: Secondary | ICD-10-CM | POA: Insufficient documentation

## 2019-10-04 DIAGNOSIS — E785 Hyperlipidemia, unspecified: Secondary | ICD-10-CM | POA: Insufficient documentation

## 2019-10-04 DIAGNOSIS — I5042 Chronic combined systolic (congestive) and diastolic (congestive) heart failure: Secondary | ICD-10-CM | POA: Insufficient documentation

## 2019-10-04 DIAGNOSIS — Z79899 Other long term (current) drug therapy: Secondary | ICD-10-CM | POA: Insufficient documentation

## 2019-10-04 DIAGNOSIS — I255 Ischemic cardiomyopathy: Secondary | ICD-10-CM | POA: Insufficient documentation

## 2019-10-04 DIAGNOSIS — Z7982 Long term (current) use of aspirin: Secondary | ICD-10-CM | POA: Insufficient documentation

## 2019-10-04 LAB — CBC WITH DIFFERENTIAL/PLATELET
Abs Immature Granulocytes: 0.04 10*3/uL (ref 0.00–0.07)
Basophils Absolute: 0.1 10*3/uL (ref 0.0–0.1)
Basophils Relative: 1 %
Eosinophils Absolute: 0.3 10*3/uL (ref 0.0–0.5)
Eosinophils Relative: 3 %
HCT: 39.8 % (ref 39.0–52.0)
Hemoglobin: 13.4 g/dL (ref 13.0–17.0)
Immature Granulocytes: 1 %
Lymphocytes Relative: 23 %
Lymphs Abs: 2 10*3/uL (ref 0.7–4.0)
MCH: 28.5 pg (ref 26.0–34.0)
MCHC: 33.7 g/dL (ref 30.0–36.0)
MCV: 84.7 fL (ref 80.0–100.0)
Monocytes Absolute: 1 10*3/uL (ref 0.1–1.0)
Monocytes Relative: 12 %
Neutro Abs: 5.4 10*3/uL (ref 1.7–7.7)
Neutrophils Relative %: 60 %
Platelets: 141 10*3/uL — ABNORMAL LOW (ref 150–400)
RBC: 4.7 MIL/uL (ref 4.22–5.81)
RDW: 12.8 % (ref 11.5–15.5)
WBC: 8.7 10*3/uL (ref 4.0–10.5)
nRBC: 0 % (ref 0.0–0.2)

## 2019-10-04 LAB — COMPREHENSIVE METABOLIC PANEL
ALT: 19 U/L (ref 0–44)
AST: 23 U/L (ref 15–41)
Albumin: 3.9 g/dL (ref 3.5–5.0)
Alkaline Phosphatase: 92 U/L (ref 38–126)
Anion gap: 7 (ref 5–15)
BUN: 38 mg/dL — ABNORMAL HIGH (ref 6–20)
CO2: 24 mmol/L (ref 22–32)
Calcium: 8.8 mg/dL — ABNORMAL LOW (ref 8.9–10.3)
Chloride: 107 mmol/L (ref 98–111)
Creatinine, Ser: 2.11 mg/dL — ABNORMAL HIGH (ref 0.61–1.24)
GFR calc Af Amer: 39 mL/min — ABNORMAL LOW (ref >60)
GFR calc non Af Amer: 34 mL/min — ABNORMAL LOW (ref >60)
Glucose, Bld: 101 mg/dL — ABNORMAL HIGH (ref 70–99)
Potassium: 4.6 mmol/L (ref 3.5–5.1)
Sodium: 138 mmol/L (ref 135–145)
Total Bilirubin: 0.6 mg/dL (ref 0.3–1.2)
Total Protein: 6.9 g/dL (ref 6.5–8.1)

## 2019-10-04 LAB — TSH: TSH: 4.021 u[IU]/mL (ref 0.350–4.500)

## 2019-10-04 MED ORDER — SODIUM CHLORIDE 0.9 % IV SOLN
Freq: Once | INTRAVENOUS | Status: AC
  Filled 2019-10-04: qty 250

## 2019-10-04 MED ORDER — AXITINIB 5 MG PO TABS
5.0000 mg | ORAL_TABLET | Freq: Two times a day (BID) | ORAL | 0 refills | Status: DC
Start: 2019-10-04 — End: 2019-12-07

## 2019-10-04 MED ORDER — SODIUM CHLORIDE 0.9 % IV SOLN
200.0000 mg | Freq: Once | INTRAVENOUS | Status: AC
  Administered 2019-10-04: 200 mg via INTRAVENOUS
  Filled 2019-10-04: qty 8

## 2019-10-04 NOTE — Progress Notes (Signed)
Patient would like to discuss if he should resume Inlyta.  There is an office note from nephrology on 09/22/19.

## 2019-10-04 NOTE — Progress Notes (Signed)
Hematology/Oncology Progress Note [facility] Telephone:([phone] [phone]  Patient Care Team: Earlie Server, MD as PCP - General (Oncology)   Name of the patient: Mark Donaldson  [id number]  [birth year]  Date of visit: 10/04/19   INTERVAL HISTORY-  59 y.o. male presents for follow-up of renal cell carcinoma treatments. Patient has past medical history CAD status post PCI to LAD in 2018, on aspirin and Plavix, CHF, history of focal sclerosing/crescentic GN-ANCA positive and CKD stage III. He was hospitalized from 05/02/2019-05/07/2019 due to gross hematuria. CT showed a 7.4 cm left lower pole renal mass concerning for RCC.  Patient also has necrotic lymphadenopathy in the mediastinum and the right supra hilar region which were highly suspicious for metastatic disease.  Bilateral lung nodules, also concerning for metastatic lung disease.  Right retrocrural lymphadenopathy with upper normal left periaortic lymph node. Patient was recommended for cytoreductive radical nephrectomy.  With history of ANCA positive vasculitis, also recommend thoracic lymphadenopathy biopsy to confirm distal metastasis.  Patient agreed to radical nephrectomy and declined bronchoscopy biopsy. He underwent left radical nephrectomy on 05/05/2019. Pathology showed pT3a pNx, RCC, conventional clear cell type, grade 3 Patient was discharged home. Today he presents to discuss pathology and the management plan. He was accompanied by his wife and daughter. Patient reports that he uses hydrocodone for pain.  Pain around the surgical site has improved.  He occasionally has tingling sensation around the surgical site.  Denies any fever or chills, drainage from the surgical site.  INTERVAL HISTORY Mark Donaldson is a 59 y.o. male who has above history reviewed by me today presents for follow up visit for management of metastatic RCC Problems and complaints are listed  below: Patient is Spanish-speaking.  Spanish interpreter present for the entire encounter for translation Patient reports no new complaints.  He has mild chronic right lower back pain and stiffness in the morning which spontaneously resolves during the day.  Previous PET scan showed no activity at the area.    Review of systems- Review of Systems  Constitutional: Negative for appetite change, chills, fatigue, fever and unexpected weight change.  HENT:   Negative for hearing loss and voice change.   Eyes: Negative for eye problems and icterus.  Respiratory: Negative for chest tightness, cough and shortness of breath.   Cardiovascular: Negative for chest pain and leg swelling.  Gastrointestinal: Negative for abdominal distention and abdominal pain.  Endocrine: Negative for hot flashes.  Genitourinary: Negative for difficulty urinating, dysuria and frequency.   Musculoskeletal: Negative for arthralgias.  Skin: Negative for itching and rash.  Neurological: Negative for light-headedness and numbness.  Hematological: Negative for adenopathy. Does not bruise/bleed easily.  Psychiatric/Behavioral: Negative for confusion.    No Known Allergies  Patient Active Problem List   Diagnosis Date Noted  . Encounter for antineoplastic immunotherapy 07/19/2019  . Encounter for antineoplastic chemotherapy 07/19/2019  . CNS lesion 07/07/2019  . Anemia in stage 3b chronic kidney disease 06/07/2019  . Goals of care, counseling/discussion 05/23/2019  . Renal cell carcinoma of left kidney ([place]) 05/23/2019  . Renal cell carcinoma ([place]) 05/23/2019  . Palliative care encounter   . History of vasculitis   . Thoracic lymphadenopathy   . Lung nodule   . CAD S/P percutaneous coronary angioplasty 05/02/2019  . Kidney mass 05/02/2019  . Hydronephrosis of left kidney 05/02/2019  . UTI (urinary tract infection) 05/02/2019  . AKI (acute kidney injury) ([place]) 05/02/2019  . Gross hematuria 05/02/2019  . Chronic  diastolic CHF (congestive heart failure) ([place]) 05/02/2019  . Sepsis ([place]) 05/02/2019  . Severe sepsis ([place]) 05/02/2019  . Preop cardiovascular exam   . Hyperlipidemia 05/21/2016  . 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 Prediabetes 05/21/2016  . Alcohol abuse 05/18/2016  . Essential hypertension 05/18/2016  . Vasculitis, 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[place]) 05/18/2016  . Shortness of breath 05/17/2016     Past Medical History:  Diagnosis Date  . ANCA-positive vasculitis ([place])   . CAD (coronary artery disease)   . Essential hypertension   . HFrEF (heart failure with reduced ejection fraction) ([place])   . Hyperlipidemia LDL goal <70   . Ischemic cardiomyopathy   . Renal cell carcinoma ([place]) 05/23/2019  . Renal disorder      Past Surgical History:  Procedure Laterality Date  . CARDIAC SURGERY     cardiac cath with 2 stent placement  . LAPAROSCOPIC NEPHRECTOMY, HAND ASSISTED Left 05/05/2019   Procedure: HAND ASSISTED LAPAROSCOPIC NEPHRECTOMY;  Surgeon: Hollice Espy, MD;  Location: [facility] ORS;  Service: Urology;  Laterality: Left;    Social History   Socioeconomic History  . Marital status: Married    Spouse name: Not on file  . Number of children: Not on file  . Years of education: Not on file  . Highest education level: Not on file  Occupational History  . Not on file  Tobacco Use  . Smoking status: Never Smoker  . Smokeless tobacco: Never Used  Substance and Sexual Activity  . Alcohol use: Yes  . Drug use: Not on file  . Sexual activity: Not on file  Other Topics Concern  . Not on file  Social History Narrative  . Not on file   Social Determinants of Health   Financial Resource Strain:   . Difficulty of Paying Living Expenses: Not on file  Food Insecurity:   . Worried About Charity fundraiser in the Last Year: Not on file  . Ran Out of Food in the Last Year: Not on file  Transportation Needs:   . Lack of Transportation (Medical): Not on file   . Lack of Transportation (Non-Medical): Not on file  Physical Activity:   . Days of Exercise per Week: Not on file  . Minutes of Exercise per Session: Not on file  Stress:   . Feeling of Stress : Not on file  Social Connections:   . Frequency of Communication with Friends and Family: Not on file  . Frequency of Social Gatherings with Friends and Family: Not on file  . Attends Religious Services: Not on file  . Active Member of Clubs or Organizations: Not on file  . Attends Archivist Meetings: Not on file  . Marital Status: Not on file  Intimate Partner Violence:   . Fear of Current or Ex-Partner: Not on file  . Emotionally Abused: Not on file  . Physically Abused: Not on file  . Sexually Abused: Not on file     Family History  Problem Relation Age of Onset  . Cancer Mother   . Blindness Brother      Current Outpatient Medications:  .  aspirin 81 MG chewable tablet, Chew 1 tablet by mouth daily., Disp: , Rfl:  .  atorvastatin (LIPITOR) 80 MG tablet, Take 80 mg by mouth daily., Disp: , Rfl:  .  clopidogrel (PLAVIX) 75 MG tablet, Take 1 tablet by mouth daily., Disp: , Rfl:  .  lisinopril (ZESTRIL) 10 MG tablet, Take 1 tablet  by mouth daily., Disp: , Rfl:  .  metoprolol succinate (TOPROL-XL) 100 MG 24 hr tablet, Take 1.5 tablets by mouth daily., Disp: , Rfl:  .  amLODipine (NORVASC) 10 MG tablet, Take 1 tablet (10 mg total) by mouth daily. (Patient not taking: Reported on 10/04/2019), Disp: 90 tablet, Rfl: 1 .  axitinib (INLYTA) 5 MG tablet, Take 1 tablet (5 mg total) by mouth 2 (two) times daily., Disp: 60 tablet, Rfl: 0 .  furosemide (LASIX) 20 MG tablet, Take 1 tablet by mouth every morning (Patient not taking: Reported on 08/02/2019), Disp: , Rfl:  .  HYDROcodone-acetaminophen (NORCO/VICODIN) 5-325 MG tablet, Take 1 tablet by mouth every 4 (four) hours as needed for moderate pain. (Patient not taking: Reported on 08/23/2019), Disp: 12 tablet, Rfl: 0 .  melatonin 5 MG  TABS, Take 1 tablet (5 mg total) by mouth at bedtime as needed. (Patient not taking: Reported on 08/02/2019), Disp: 30 tablet, Rfl: 0   Physical exam:  Vitals:   10/04/19 0939  BP: 128/84  Pulse: 61  Resp: 16  Temp: (!) 97.4 F (36.3 C)  Weight: 161 lb 1.6 oz (73.1 kg)   Physical Exam Constitutional:      General: He is not in acute distress.    Appearance: He is not diaphoretic.  HENT:     Head: Normocephalic and atraumatic.     Nose: Nose normal.     Mouth/Throat:     Pharynx: No oropharyngeal exudate.  Eyes:     General: No scleral icterus.    Pupils: Pupils are equal, round, and reactive to light.  Cardiovascular:     Rate and Rhythm: Normal rate and regular rhythm.     Heart sounds: No murmur heard.   Pulmonary:     Effort: Pulmonary effort is normal. No respiratory distress.     Breath sounds: No rales.  Chest:     Chest wall: No tenderness.  Abdominal:     General: There is no distension.     Palpations: Abdomen is soft.     Tenderness: There is no abdominal tenderness.  Musculoskeletal:        General: Normal range of motion.     Cervical back: Normal range of motion and neck supple.  Skin:    General: Skin is warm and dry.     Findings: No erythema.  Neurological:     Mental Status: He is alert and oriented to person, place, and time.     Cranial Nerves: No cranial nerve deficit.     Motor: No abnormal muscle tone.     Coordination: Coordination normal.  Psychiatric:        Mood and Affect: Affect normal.        CMP Latest Ref Rng & Units 10/04/2019  Glucose 70 - 99 mg/dL 101(H)  BUN 6 - 20 mg/dL 38(H)  Creatinine 0.61 - 1.24 mg/dL 2.11(H)  Sodium 135 - 145 mmol/L 138  Potassium 3.5 - 5.1 mmol/L 4.6  Chloride 98 - 111 mmol/L 107  CO2 22 - 32 mmol/L 24  Calcium 8.9 - 10.3 mg/dL 8.8(L)  Total Protein 6.5 - 8.1 g/dL 6.9  Total Bilirubin 0.3 - 1.2 mg/dL 0.6  Alkaline Phos 38 - 126 U/L 92  AST 15 - 41 U/L 23  ALT 0 - 44 U/L 19   CBC Latest Ref  Rng & Units 10/04/2019  WBC 4.0 - 10.5 K/uL 8.7  Hemoglobin 13.0 - 17.0 g/dL 13.4  Hematocrit 39 - 52 %  39.8  Platelets 150 - 400 K/uL 141(L)    RADIOGRAPHIC STUDIES: I have personally reviewed the radiological images as listed and agreed with the findings in the report. NM PET Image Initial (PI) Skull Base To Thigh  Result Date: 09/08/2019 CLINICAL DATA:  Subsequent treatment strategy for renal cell carcinoma. Mediastinal nodal metastasis. EXAM: NUCLEAR MEDICINE PET SKULL BASE TO THIGH TECHNIQUE: 8.8 mCi F-18 FDG was injected intravenously. Full-ring PET imaging was performed from the skull base to thigh after the radiotracer. CT data was obtained and used for attenuation correction and anatomic localization. Fasting blood glucose: 100 mg/dl COMPARISON:  CT 05/03/2019 FINDINGS: Mediastinal blood pool activity: SUV max 2.3 Liver activity: SUV max NA NECK: No hypermetabolic lymph nodes in the neck. Incidental CT findings: none CHEST: Small hypermetabolic nodule along the pleural surface of the anterior mediastinum measures SUV max 6.4. This activity corresponds to a RIGHT pleural base nodule measuring 14 mm x 9 mm on image 90/3. This is at the same site of previously enlarged necrotic lymph nodes on CT 05/03/2019. The majority of the previously seen nodes have regressed with only this solitary hypermetabolic nodule remaining. Incidental CT findings: 9 mm nodule along the RIGHT oblique fissure (image 104/3) is decreased from 16 mm on prior and has very low metabolic activity SUV max equal 1.2. ABDOMEN/PELVIS: Post LEFT nephrectomy anatomy. No hypermetabolic nodularity in the nephrectomy bed. No nodularity on the CT portion exam. No evidence of a task cysts in the abdomen pelvis. Physiologic activity within the stomach. Intense activity in the stomach is favored physiologic. Incidental CT findings: none SKELETON: No focal hypermetabolic activity to suggest skeletal metastasis. Incidental CT findings: none  IMPRESSION: 1. In the anterior mediastinum, there is residual hypermetabolic nodule along the RIGHT pleural surface at site of previous seen multiple metastatic nodules. 2. Interval decrease in size of RIGHT lung pulmonary nodule with minimal metabolic activity. 3. Post LEFT nephrectomy without evidence residual carcinoma. Electronically Signed   By: Suzy Bouchard M.D.   On: 09/08/2019 12:27    Assessment and plan-  Patient is a 59 y.o. male history of CAD, status post stent, CHF, history of ANCA positive sclerosing/crescentic GN, chronic kidney disease stage III presents for treatments of stage IV renal cell carcinoma 1. Renal cell carcinoma of left kidney (HCC)   2. Encounter for antineoplastic immunotherapy   3. Encounter for antineoplastic chemotherapy   4. Stage 3b chronic kidney disease   5. Brain lesion    # Stage IV renal cell carcinoma Labs are reviewed and discussed with patient Proceed with Keytruda treatments today. He had PET scan done in August 2021.  He had a good partial response to the treatment. Axitinib he has been held due to worsening of the kidney function.  Kidney function has not significantly improved and patient has establish care with nephrologist.  Currently creatinine levels are stable and I advised patient to resume axitinib 5 mg twice daily.  Continue follow-up and monitor labs.  #History of diastolic CHF, 4/49/6759 LVEF 60 to 65%.  Lasix was held due to worsening of kidney function. Continue lisinopril and metoprolol.  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 Steva Ready.  Pending work-up, blood test to be done at [facility].Per ID, patient will need to have ophthalmology examination.  Patient has had ophthalmology evaluation.  Defer to infectious disease Dr. Steva Ready for management of his neurocysticercosis lesion to ID.  We spent sufficient time to discuss many aspect of care, questions were answered to patient's  satisfaction. Follow-up in  3 week for follow-up of kidney function and evaluation prior to immunotherapy. Thank you for allowing me to participate in the care of this patient.   Earlie Server, MD, PhD Hematology Oncology [facility] Pager- [phone] 10/04/2019

## 2019-10-25 ENCOUNTER — Encounter: Payer: Self-pay | Admitting: Oncology

## 2019-10-25 ENCOUNTER — Inpatient Hospital Stay (HOSPITAL_BASED_OUTPATIENT_CLINIC_OR_DEPARTMENT_OTHER): Payer: Self-pay | Admitting: Oncology

## 2019-10-25 ENCOUNTER — Inpatient Hospital Stay: Payer: Self-pay | Attending: Oncology

## 2019-10-25 ENCOUNTER — Inpatient Hospital Stay: Payer: Self-pay

## 2019-10-25 ENCOUNTER — Other Ambulatory Visit: Payer: Self-pay

## 2019-10-25 VITALS — BP 150/104 | HR 65

## 2019-10-25 VITALS — BP 167/100 | HR 73 | Temp 97.1°F | Resp 16 | Wt 159.3 lb

## 2019-10-25 DIAGNOSIS — Z23 Encounter for immunization: Secondary | ICD-10-CM | POA: Insufficient documentation

## 2019-10-25 DIAGNOSIS — Z5112 Encounter for antineoplastic immunotherapy: Secondary | ICD-10-CM | POA: Insufficient documentation

## 2019-10-25 DIAGNOSIS — N1832 Chronic kidney disease, stage 3b: Secondary | ICD-10-CM

## 2019-10-25 DIAGNOSIS — C642 Malignant neoplasm of left kidney, except renal pelvis: Secondary | ICD-10-CM | POA: Insufficient documentation

## 2019-10-25 DIAGNOSIS — L299 Pruritus, unspecified: Secondary | ICD-10-CM | POA: Insufficient documentation

## 2019-10-25 DIAGNOSIS — G939 Disorder of brain, unspecified: Secondary | ICD-10-CM | POA: Insufficient documentation

## 2019-10-25 DIAGNOSIS — I5042 Chronic combined systolic (congestive) and diastolic (congestive) heart failure: Secondary | ICD-10-CM | POA: Insufficient documentation

## 2019-10-25 DIAGNOSIS — D631 Anemia in chronic kidney disease: Secondary | ICD-10-CM | POA: Insufficient documentation

## 2019-10-25 DIAGNOSIS — I130 Hypertensive heart and chronic kidney disease with heart failure and stage 1 through stage 4 chronic kidney disease, or unspecified chronic kidney disease: Secondary | ICD-10-CM | POA: Insufficient documentation

## 2019-10-25 DIAGNOSIS — E785 Hyperlipidemia, unspecified: Secondary | ICD-10-CM | POA: Insufficient documentation

## 2019-10-25 DIAGNOSIS — Z9861 Coronary angioplasty status: Secondary | ICD-10-CM | POA: Insufficient documentation

## 2019-10-25 DIAGNOSIS — Z5111 Encounter for antineoplastic chemotherapy: Secondary | ICD-10-CM

## 2019-10-25 DIAGNOSIS — I2510 Atherosclerotic heart disease of native coronary artery without angina pectoris: Secondary | ICD-10-CM | POA: Insufficient documentation

## 2019-10-25 DIAGNOSIS — B690 Cysticercosis of central nervous system: Secondary | ICD-10-CM | POA: Insufficient documentation

## 2019-10-25 DIAGNOSIS — I255 Ischemic cardiomyopathy: Secondary | ICD-10-CM | POA: Insufficient documentation

## 2019-10-25 DIAGNOSIS — Z79899 Other long term (current) drug therapy: Secondary | ICD-10-CM | POA: Insufficient documentation

## 2019-10-25 LAB — CBC WITH DIFFERENTIAL/PLATELET
Abs Immature Granulocytes: 0.01 10*3/uL (ref 0.00–0.07)
Basophils Absolute: 0 10*3/uL (ref 0.0–0.1)
Basophils Relative: 1 %
Eosinophils Absolute: 0.3 10*3/uL (ref 0.0–0.5)
Eosinophils Relative: 3 %
HCT: 46.9 % (ref 39.0–52.0)
Hemoglobin: 15.9 g/dL (ref 13.0–17.0)
Immature Granulocytes: 0 %
Lymphocytes Relative: 34 %
Lymphs Abs: 2.8 10*3/uL (ref 0.7–4.0)
MCH: 28 pg (ref 26.0–34.0)
MCHC: 33.9 g/dL (ref 30.0–36.0)
MCV: 82.7 fL (ref 80.0–100.0)
Monocytes Absolute: 1.1 10*3/uL — ABNORMAL HIGH (ref 0.1–1.0)
Monocytes Relative: 13 %
Neutro Abs: 4 10*3/uL (ref 1.7–7.7)
Neutrophils Relative %: 49 %
Platelets: 187 10*3/uL (ref 150–400)
RBC: 5.67 MIL/uL (ref 4.22–5.81)
RDW: 12.7 % (ref 11.5–15.5)
WBC: 8.1 10*3/uL (ref 4.0–10.5)
nRBC: 0 % (ref 0.0–0.2)

## 2019-10-25 LAB — COMPREHENSIVE METABOLIC PANEL
ALT: 25 U/L (ref 0–44)
AST: 29 U/L (ref 15–41)
Albumin: 4.3 g/dL (ref 3.5–5.0)
Alkaline Phosphatase: 106 U/L (ref 38–126)
Anion gap: 7 (ref 5–15)
BUN: 42 mg/dL — ABNORMAL HIGH (ref 6–20)
CO2: 26 mmol/L (ref 22–32)
Calcium: 9.3 mg/dL (ref 8.9–10.3)
Chloride: 106 mmol/L (ref 98–111)
Creatinine, Ser: 2 mg/dL — ABNORMAL HIGH (ref 0.61–1.24)
GFR, Estimated: 36 mL/min — ABNORMAL LOW (ref >60)
Glucose, Bld: 102 mg/dL — ABNORMAL HIGH (ref 70–99)
Potassium: 5 mmol/L (ref 3.5–5.1)
Sodium: 139 mmol/L (ref 135–145)
Total Bilirubin: 0.8 mg/dL (ref 0.3–1.2)
Total Protein: 7.6 g/dL (ref 6.5–8.1)

## 2019-10-25 MED ORDER — SODIUM CHLORIDE 0.9 % IV SOLN
Freq: Once | INTRAVENOUS | Status: AC
  Filled 2019-10-25: qty 250

## 2019-10-25 MED ORDER — SODIUM CHLORIDE 0.9 % IV SOLN
200.0000 mg | Freq: Once | INTRAVENOUS | Status: AC
  Administered 2019-10-25: 200 mg via INTRAVENOUS
  Filled 2019-10-25: qty 8

## 2019-10-25 NOTE — Progress Notes (Signed)
Patient has noticed hoarseness in voice, difficulty breathing at night, and new spots on his legs that do itch.  Blood pressure is elevated in the office and he has not taken his bp meds today

## 2019-10-25 NOTE — Progress Notes (Signed)
Hematology/Oncology Progress Note [facility] [phone] [phone]  Patient Care Team: Earlie Server, MD as PCP - General (Oncology)   Name of the patient: Mark Donaldson  [id number]  Oct 03, 1960  Date of visit: 10/25/19   INTERVAL HISTORY-  59 y.o. male presents for follow-up of renal cell carcinoma treatments. Patient has past medical history CAD status post PCI to LAD in 2018, on aspirin and Plavix, CHF, history of focal sclerosing/crescentic GN-ANCA positive and CKD stage III. He was hospitalized from 05/02/2019-05/07/2019 due to gross hematuria. CT showed a 7.4 cm left lower pole renal mass concerning for RCC.  Patient also has necrotic lymphadenopathy in the mediastinum and the right supra hilar region which were highly suspicious for metastatic disease.  Bilateral lung nodules, also concerning for metastatic lung disease.  Right retrocrural lymphadenopathy with upper normal left periaortic lymph node. Patient was recommended for cytoreductive radical nephrectomy.  With history of ANCA positive vasculitis, also recommend thoracic lymphadenopathy biopsy to confirm distal metastasis.  Patient agreed to radical nephrectomy and declined bronchoscopy biopsy. He underwent left radical nephrectomy on 05/05/2019. Pathology showed pT3a pNx, RCC, conventional clear cell type, grade 3 Patient was discharged home. Today he presents to discuss pathology and the management plan. He was accompanied by his wife and daughter. Patient reports that he uses hydrocodone for pain.  Pain around the surgical site has improved.  He occasionally has tingling sensation around the surgical site.  Denies any fever or chills, drainage from the surgical site.  INTERVAL HISTORY Mark Donaldson is a 59 y.o. male who has above history reviewed by me today presents for follow up visit for management of metastatic RCC Problems and complaints are listed  below: Patient is Spanish-speaking.  Spanish interpreter present for the entire encounter for translation Patient will start axitinib and reports hoarse voice since the start of axitinib. Also he has experienced nighttime shortness of breath, no alleviating factors.  Not exacerbated by exertion during the day.  He reports that if he sits for prolonged time, he feels shortness of breath.  Patient also noticed a few areas of bruises on bilateral thighs.  No bruising on the rest of his body.  Denies any trauma. Patient has skin itchiness He did not take blood pressure in the morning.  In the clinic blood pressure was initially 163/112.  Review of systems- Review of Systems  Constitutional: Negative for appetite change, chills, fatigue, fever and unexpected weight change.  HENT:   Negative for hearing loss and voice change.   Eyes: Negative for eye problems and icterus.  Respiratory: Negative for chest tightness, cough and shortness of breath.   Cardiovascular: Negative for chest pain and leg swelling.  Gastrointestinal: Negative for abdominal distention and abdominal pain.  Endocrine: Negative for hot flashes.  Genitourinary: Negative for difficulty urinating, dysuria and frequency.   Musculoskeletal: Negative for arthralgias.  Skin: Negative for itching and rash.  Neurological: Negative for light-headedness and numbness.  Hematological: Negative for adenopathy. Does not bruise/bleed easily.  Psychiatric/Behavioral: Negative for confusion.    No Known Allergies  Patient Active Problem List   Diagnosis Date Noted  . Stage 3b chronic kidney disease ([place]) 10/04/2019  . Brain lesion 10/04/2019  . Encounter for antineoplastic immunotherapy 07/19/2019  . Encounter for antineoplastic chemotherapy 07/19/2019  . CNS lesion 07/07/2019  . Anemia in stage 3b chronic kidney disease ([place]) 06/07/2019  . Goals of care, counseling/discussion 05/23/2019  . Renal cell carcinoma of left kidney (HCC)  05/23/2019  . Renal cell carcinoma ([place]) 05/23/2019  . Palliative care encounter   . History of vasculitis   . Thoracic lymphadenopathy   . Lung nodule   . CAD S/P percutaneous coronary angioplasty 05/02/2019  . Kidney mass 05/02/2019  . Hydronephrosis of left kidney 05/02/2019  . UTI (urinary tract infection) 05/02/2019  . AKI (acute kidney injury) ([place]) 05/02/2019  . Gross hematuria 05/02/2019  . Chronic diastolic CHF (congestive heart failure) ([place]) 05/02/2019  . Sepsis ([place]) 05/02/2019  . Severe sepsis ([place]) 05/02/2019  . Preop cardiovascular exam   . Hyperlipidemia 05/21/2016  . 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 Prediabetes 05/21/2016  . Alcohol abuse 05/18/2016  . Essential hypertension 05/18/2016  . Vasculitis, 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[place]) 05/18/2016  . Shortness of breath 05/17/2016     Past Medical History:  Diagnosis Date  . ANCA-positive vasculitis ([place])   . CAD (coronary artery disease)   . Essential hypertension   . HFrEF (heart failure with reduced ejection fraction) ([place])   . Hyperlipidemia LDL goal <70   . Ischemic cardiomyopathy   . Renal cell carcinoma ([place]) 05/23/2019  . Renal disorder      Past Surgical History:  Procedure Laterality Date  . CARDIAC SURGERY     cardiac cath with 2 stent placement  . LAPAROSCOPIC NEPHRECTOMY, HAND ASSISTED Left 05/05/2019   Procedure: HAND ASSISTED LAPAROSCOPIC NEPHRECTOMY;  Surgeon: Hollice Espy, MD;  Location: [facility] ORS;  Service: Urology;  Laterality: Left;    Social History   Socioeconomic History  . Marital status: Married    Spouse name: Not on file  . Number of children: Not on file  . Years of education: Not on file  . Highest education level: Not on file  Occupational History  . Not on file  Tobacco Use  . Smoking status: Never Smoker  . Smokeless tobacco: Never Used  Substance and Sexual Activity  . Alcohol use: Yes  . Drug use: Not on file  . Sexual activity: Not  on file  Other Topics Concern  . Not on file  Social History Narrative  . Not on file   Social Determinants of Health   Financial Resource Strain:   . Difficulty of Paying Living Expenses: Not on file  Food Insecurity:   . Worried About Charity fundraiser in the Last Year: Not on file  . Ran Out of Food in the Last Year: Not on file  Transportation Needs:   . Lack of Transportation (Medical): Not on file  . Lack of Transportation (Non-Medical): Not on file  Physical Activity:   . Days of Exercise per Week: Not on file  . Minutes of Exercise per Session: Not on file  Stress:   . Feeling of Stress : Not on file  Social Connections:   . Frequency of Communication with Friends and Family: Not on file  . Frequency of Social Gatherings with Friends and Family: Not on file  . Attends Religious Services: Not on file  . Active Member of Clubs or Organizations: Not on file  . Attends Archivist Meetings: Not on file  . Marital Status: Not on file  Intimate Partner Violence:   . Fear of Current or Ex-Partner: Not on file  . Emotionally Abused: Not on file  . Physically Abused: Not on file  . Sexually Abused: Not on file     Family History  Problem Relation Age of Onset  . Cancer Mother   .  Blindness Brother      Current Outpatient Medications:  .  aspirin 81 MG chewable tablet, Chew 1 tablet by mouth daily., Disp: , Rfl:  .  atorvastatin (LIPITOR) 80 MG tablet, Take 80 mg by mouth daily., Disp: , Rfl:  .  axitinib (INLYTA) 5 MG tablet, Take 1 tablet (5 mg total) by mouth 2 (two) times daily., Disp: 60 tablet, Rfl: 0 .  clopidogrel (PLAVIX) 75 MG tablet, Take 1 tablet by mouth daily., Disp: , Rfl:  .  lisinopril (ZESTRIL) 10 MG tablet, Take 1 tablet by mouth daily., Disp: , Rfl:  .  metoprolol succinate (TOPROL-XL) 100 MG 24 hr tablet, Take 1.5 tablets by mouth daily., Disp: , Rfl:  .  amLODipine (NORVASC) 10 MG tablet, Take 1 tablet (10 mg total) by mouth daily.  (Patient not taking: Reported on 10/04/2019), Disp: 90 tablet, Rfl: 1 .  furosemide (LASIX) 20 MG tablet, Take 1 tablet by mouth every morning (Patient not taking: Reported on 08/02/2019), Disp: , Rfl:  .  HYDROcodone-acetaminophen (NORCO/VICODIN) 5-325 MG tablet, Take 1 tablet by mouth every 4 (four) hours as needed for moderate pain. (Patient not taking: Reported on 08/23/2019), Disp: 12 tablet, Rfl: 0 .  melatonin 5 MG TABS, Take 1 tablet (5 mg total) by mouth at bedtime as needed. (Patient not taking: Reported on 08/02/2019), Disp: 30 tablet, Rfl: 0   Physical exam:  Vitals:   10/25/19 0933 10/25/19 1025  BP: (!) 163/112 (!) 167/100  Pulse: 71 73  Resp: 16   Temp: (!) 97.1 F (36.2 C)   Weight: 159 lb 4.8 oz (72.3 kg)    Physical Exam Constitutional:      General: He is not in acute distress.    Appearance: He is not diaphoretic.  HENT:     Head: Normocephalic and atraumatic.     Nose: Nose normal.     Mouth/Throat:     Pharynx: No oropharyngeal exudate.  Eyes:     General: No scleral icterus.    Pupils: Pupils are equal, round, and reactive to light.  Cardiovascular:     Rate and Rhythm: Normal rate and regular rhythm.     Heart sounds: No murmur heard.   Pulmonary:     Effort: Pulmonary effort is normal. No respiratory distress.     Breath sounds: No rales.  Chest:     Chest wall: No tenderness.  Abdominal:     General: There is no distension.     Palpations: Abdomen is soft.     Tenderness: There is no abdominal tenderness.  Musculoskeletal:        General: Normal range of motion.     Cervical back: Normal range of motion and neck supple.  Skin:    General: Skin is warm and dry.     Findings: No erythema.  Neurological:     Mental Status: He is alert and oriented to person, place, and time.     Cranial Nerves: No cranial nerve deficit.     Motor: No abnormal muscle tone.     Coordination: Coordination normal.  Psychiatric:        Mood and Affect: Affect normal.         CMP Latest Ref Rng & Units 10/25/2019  Glucose 70 - 99 mg/dL 102(H)  BUN 6 - 20 mg/dL 42(H)  Creatinine 0.61 - 1.24 mg/dL 2.00(H)  Sodium 135 - 145 mmol/L 139  Potassium 3.5 - 5.1 mmol/L 5.0  Chloride 98 - 111 mmol/L  106  CO2 22 - 32 mmol/L 26  Calcium 8.9 - 10.3 mg/dL 9.3  Total Protein 6.5 - 8.1 g/dL 7.6  Total Bilirubin 0.3 - 1.2 mg/dL 0.8  Alkaline Phos 38 - 126 U/L 106  AST 15 - 41 U/L 29  ALT 0 - 44 U/L 25   CBC Latest Ref Rng & Units 10/25/2019  WBC 4.0 - 10.5 K/uL 8.1  Hemoglobin 13.0 - 17.0 g/dL 15.9  Hematocrit 39 - 52 % 46.9  Platelets 150 - 400 K/uL 187    RADIOGRAPHIC STUDIES: I have personally reviewed the radiological images as listed and agreed with the findings in the report. No results found.  Assessment and plan-  Patient is a 59 y.o. male history of CAD, status post stent, CHF, history of ANCA positive sclerosing/crescentic GN, chronic kidney disease stage III presents for treatments of stage IV renal cell carcinoma 1. Renal cell carcinoma of left kidney (HCC)   2. Encounter for antineoplastic immunotherapy   3. Encounter for antineoplastic chemotherapy   4. Stage 3b chronic kidney disease ([place])   5. Brain lesion   6. Anemia in stage 3b chronic kidney disease ([place])    # Stage IV renal cell carcinoma Labs reviewed and discussed with patient. Proceed with Keytruda treatment today.  #Uncontrolled hypertension, it is unclear that the elevated blood pressure is due to missing his morning BP medication versus side effects from axitinib.  Patient took metoprolol and lisinopril during today's clinic and I emphasized the importance of taking his blood pressure medication on time and also measure blood pressure closely at home. Hold axitinib for now.  Patient will follow up in the clinic in 1 week for reevaluation.  Skin itchiness may be secondary to immunotherapy treatments.  Advised patient to use over-the-counter Benadryl as instructed for skin  itchiness.  #History of diastolic CHF, 7/54/4920 LVEF 60 to 65%.  Lasix was held due to worsening of kidney function. Continue lisinopril and metoprolol.  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 Steva Ready.  Pending work-up, blood test to be done at [facility]Per ID, patient will need to have ophthalmology examination.  Patient has had ophthalmology evaluation.  Defer to infectious disease Dr. Steva Ready for management of his neurocysticercosis lesion to ID.  #Easy bruising is likely side effects from axitinib.  We spent sufficient time to discuss many aspect of care, questions were answered to patient's satisfaction. Follow-up in 1 week for evaluation and 3 week for follow-up of evaluation prior to immunotherapy. Thank you for allowing me to participate in the care of this patient.   Earlie Server, MD, PhD Hematology Oncology [facility] at [facility] Pager- [phone] 10/25/2019

## 2019-10-31 ENCOUNTER — Other Ambulatory Visit
Admission: RE | Admit: 2019-10-31 | Discharge: 2019-10-31 | Disposition: A | Discharge status: Home / Self Care | Payer: Self-pay | Attending: Nephrology | Admitting: Nephrology

## 2019-10-31 ENCOUNTER — Other Ambulatory Visit: Payer: Self-pay

## 2019-10-31 DIAGNOSIS — N1832 Chronic kidney disease, stage 3b: Secondary | ICD-10-CM | POA: Insufficient documentation

## 2019-10-31 LAB — CBC WITH DIFFERENTIAL/PLATELET
Abs Immature Granulocytes: 0.02 10*3/uL (ref 0.00–0.07)
Basophils Absolute: 0.1 10*3/uL (ref 0.0–0.1)
Basophils Relative: 1 %
Eosinophils Absolute: 0.4 10*3/uL (ref 0.0–0.5)
Eosinophils Relative: 5 %
HCT: 45.2 % (ref 39.0–52.0)
Hemoglobin: 14.5 g/dL (ref 13.0–17.0)
Immature Granulocytes: 0 %
Lymphocytes Relative: 17 %
Lymphs Abs: 1.5 10*3/uL (ref 0.7–4.0)
MCH: 27.6 pg (ref 26.0–34.0)
MCHC: 32.1 g/dL (ref 30.0–36.0)
MCV: 86.1 fL (ref 80.0–100.0)
Monocytes Absolute: 1.1 10*3/uL — ABNORMAL HIGH (ref 0.1–1.0)
Monocytes Relative: 13 %
Neutro Abs: 5.7 10*3/uL (ref 1.7–7.7)
Neutrophils Relative %: 64 %
Platelets: 169 10*3/uL (ref 150–400)
RBC: 5.25 MIL/uL (ref 4.22–5.81)
RDW: 13.4 % (ref 11.5–15.5)
WBC: 8.8 10*3/uL (ref 4.0–10.5)
nRBC: 0 % (ref 0.0–0.2)

## 2019-10-31 LAB — RENAL FUNCTION PANEL
Albumin: 3.9 g/dL (ref 3.5–5.0)
Anion gap: 7 (ref 5–15)
BUN: 43 mg/dL — ABNORMAL HIGH (ref 6–20)
CO2: 26 mmol/L (ref 22–32)
Calcium: 8.8 mg/dL — ABNORMAL LOW (ref 8.9–10.3)
Chloride: 103 mmol/L (ref 98–111)
Creatinine, Ser: 2.2 mg/dL — ABNORMAL HIGH (ref 0.61–1.24)
GFR, Estimated: 32 mL/min — ABNORMAL LOW (ref >60)
Glucose, Bld: 85 mg/dL (ref 70–99)
Phosphorus: 4.1 mg/dL (ref 2.5–4.6)
Potassium: 4.7 mmol/L (ref 3.5–5.1)
Sodium: 136 mmol/L (ref 135–145)

## 2019-11-01 LAB — PARATHYROID HORMONE, INTACT (NO CA)

## 2019-11-01 LAB — MICROALBUMIN / CREATININE URINE RATIO
Creatinine, Urine: 71 mg/dL
Microalb Creat Ratio: 22 mg/g creat (ref 0–29)
Microalb, Ur: 15.7 ug/mL — ABNORMAL HIGH

## 2019-11-02 ENCOUNTER — Other Ambulatory Visit: Payer: Self-pay

## 2019-11-02 ENCOUNTER — Inpatient Hospital Stay: Payer: Self-pay

## 2019-11-02 ENCOUNTER — Encounter: Payer: Self-pay | Admitting: Oncology

## 2019-11-02 ENCOUNTER — Inpatient Hospital Stay (HOSPITAL_BASED_OUTPATIENT_CLINIC_OR_DEPARTMENT_OTHER): Payer: Self-pay | Admitting: Oncology

## 2019-11-02 VITALS — BP 118/85 | HR 81 | Temp 98.6°F | Resp 18 | Wt 164.1 lb

## 2019-11-02 DIAGNOSIS — I10 Essential (primary) hypertension: Secondary | ICD-10-CM

## 2019-11-02 DIAGNOSIS — Z5111 Encounter for antineoplastic chemotherapy: Secondary | ICD-10-CM

## 2019-11-02 DIAGNOSIS — C642 Malignant neoplasm of left kidney, except renal pelvis: Secondary | ICD-10-CM

## 2019-11-02 DIAGNOSIS — Z23 Encounter for immunization: Secondary | ICD-10-CM

## 2019-11-02 LAB — PARATHYROID HORMONE, INTACT (NO CA): PTH: 42 pg/mL (ref 15–65)

## 2019-11-02 MED ORDER — INFLUENZA VAC SPLIT QUAD 0.5 ML IM SUSY
0.5000 mL | PREFILLED_SYRINGE | Freq: Once | INTRAMUSCULAR | Status: AC
  Administered 2019-11-02: 0.5 mL via INTRAMUSCULAR
  Filled 2019-11-02: qty 0.5

## 2019-11-02 NOTE — Progress Notes (Signed)
Pt here for follow up on bp check. Pt reports feeling well and has checked bp at home.

## 2019-11-02 NOTE — Progress Notes (Signed)
Hematology/Oncology Progress Note [facility] Telephone:([phone] [phone]  Patient Care Team: Earlie Server, MD as PCP - General (Oncology)   Name of the patient: Mark Donaldson  [id number]  1960-01-15  Date of visit: 11/02/19   INTERVAL HISTORY-  59 y.o. male presents for follow-up of renal cell carcinoma treatments. Patient has past medical history CAD status post PCI to LAD in 2018, on aspirin and Plavix, CHF, history of focal sclerosing/crescentic GN-ANCA positive and CKD stage III. He was hospitalized from 05/02/2019-05/07/2019 due to gross hematuria. CT showed a 7.4 cm left lower pole renal mass concerning for RCC.  Patient also has necrotic lymphadenopathy in the mediastinum and the right supra hilar region which were highly suspicious for metastatic disease.  Bilateral lung nodules, also concerning for metastatic lung disease.  Right retrocrural lymphadenopathy with upper normal left periaortic lymph node. Patient was recommended for cytoreductive radical nephrectomy.  With history of ANCA positive vasculitis, also recommend thoracic lymphadenopathy biopsy to confirm distal metastasis.  Patient agreed to radical nephrectomy and declined bronchoscopy biopsy. He underwent left radical nephrectomy on 05/05/2019. Pathology showed pT3a pNx, RCC, conventional clear cell type, grade 3 Patient was discharged home. Today he presents to discuss pathology and the management plan. He was accompanied by his wife and daughter. Patient reports that he uses hydrocodone for pain.  Pain around the surgical site has improved.  He occasionally has tingling sensation around the surgical site.  Denies any fever or chills, drainage from the surgical site.  INTERVAL HISTORY Mark Donaldson is a 59 y.o. male who has above history reviewed by me today presents for follow up visit for management of metastatic RCC Problems and complaints are listed  below: Patient is Spanish-speaking.  Spanish interpreter present for the entire encounter for translation Patient takes blood pressure medication.  Follow-up for recheck of blood pressure and reevaluation.  Voice hoarseness improves after holding axitinib.  No new complaints.  Review of systems- Review of Systems  Constitutional: Negative for appetite change, chills, fatigue, fever and unexpected weight change.  HENT:   Negative for hearing loss and voice change.   Eyes: Negative for eye problems and icterus.  Respiratory: Negative for chest tightness, cough and shortness of breath.   Cardiovascular: Negative for chest pain and leg swelling.  Gastrointestinal: Negative for abdominal distention and abdominal pain.  Endocrine: Negative for hot flashes.  Genitourinary: Negative for difficulty urinating, dysuria and frequency.   Musculoskeletal: Negative for arthralgias.  Skin: Negative for itching and rash.  Neurological: Negative for light-headedness and numbness.  Hematological: Negative for adenopathy. Does not bruise/bleed easily.  Psychiatric/Behavioral: Negative for confusion.    No Known Allergies  Patient Active Problem List   Diagnosis Date Noted  . Stage 3b chronic kidney disease ([place]) 10/04/2019  . Brain lesion 10/04/2019  . Encounter for antineoplastic immunotherapy 07/19/2019  . Encounter for antineoplastic chemotherapy 07/19/2019  . CNS lesion 07/07/2019  . Anemia in stage 3b chronic kidney disease ([place]) 06/07/2019  . Goals of care, counseling/discussion 05/23/2019  . Renal cell carcinoma of left kidney ([place]) 05/23/2019  . Renal cell carcinoma ([place]) 05/23/2019  . Palliative care encounter   . History of vasculitis   . Thoracic lymphadenopathy   . Lung nodule   . CAD S/P percutaneous coronary angioplasty 05/02/2019  . Kidney mass 05/02/2019  . Hydronephrosis of left kidney 05/02/2019  . UTI (urinary tract infection) 05/02/2019  . AKI (acute kidney injury) ([place])  05/02/2019  . Gross hematuria  05/02/2019  . Chronic diastolic CHF (congestive heart failure) ([place]) 05/02/2019  . Sepsis ([place]) 05/02/2019  . Severe sepsis ([place]) 05/02/2019  . Preop cardiovascular exam   . Hyperlipidemia 05/21/2016  . 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 Prediabetes 05/21/2016  . Alcohol abuse 05/18/2016  . Essential hypertension 05/18/2016  . Vasculitis, 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[place]) 05/18/2016  . Shortness of breath 05/17/2016     Past Medical History:  Diagnosis Date  . ANCA-positive vasculitis ([place])   . CAD (coronary artery disease)   . Essential hypertension   . HFrEF (heart failure with reduced ejection fraction) ([place])   . Hyperlipidemia LDL goal <70   . Ischemic cardiomyopathy   . Renal cell carcinoma ([place]) 05/23/2019  . Renal disorder      Past Surgical History:  Procedure Laterality Date  . CARDIAC SURGERY     cardiac cath with 2 stent placement  . LAPAROSCOPIC NEPHRECTOMY, HAND ASSISTED Left 05/05/2019   Procedure: HAND ASSISTED LAPAROSCOPIC NEPHRECTOMY;  Surgeon: Hollice Espy, MD;  Location: [facility] ORS;  Service: Urology;  Laterality: Left;    Social History   Socioeconomic History  . Marital status: Married    Spouse name: Not on file  . Number of children: Not on file  . Years of education: Not on file  . Highest education level: Not on file  Occupational History  . Not on file  Tobacco Use  . Smoking status: Never Smoker  . Smokeless tobacco: Never Used  Substance and Sexual Activity  . Alcohol use: Yes  . Drug use: Not on file  . Sexual activity: Not on file  Other Topics Concern  . Not on file  Social History Narrative  . Not on file   Social Determinants of Health   Financial Resource Strain:   . Difficulty of Paying Living Expenses: Not on file  Food Insecurity:   . Worried About Charity fundraiser in the Last Year: Not on file  . Ran Out of Food in the Last Year: Not on file  Transportation  Needs:   . Lack of Transportation (Medical): Not on file  . Lack of Transportation (Non-Medical): Not on file  Physical Activity:   . Days of Exercise per Week: Not on file  . Minutes of Exercise per Session: Not on file  Stress:   . Feeling of Stress : Not on file  Social Connections:   . Frequency of Communication with Friends and Family: Not on file  . Frequency of Social Gatherings with Friends and Family: Not on file  . Attends Religious Services: Not on file  . Active Member of Clubs or Organizations: Not on file  . Attends Archivist Meetings: Not on file  . Marital Status: Not on file  Intimate Partner Violence:   . Fear of Current or Ex-Partner: Not on file  . Emotionally Abused: Not on file  . Physically Abused: Not on file  . Sexually Abused: Not on file     Family History  Problem Relation Age of Onset  . Cancer Mother   . Blindness Brother      Current Outpatient Medications:  .  aspirin 81 MG chewable tablet, Chew 1 tablet by mouth daily., Disp: , Rfl:  .  atorvastatin (LIPITOR) 80 MG tablet, Take 80 mg by mouth daily., Disp: , Rfl:  .  clopidogrel (PLAVIX) 75 MG tablet, Take 1 tablet by mouth daily., Disp: , Rfl:  .  lisinopril (ZESTRIL) 10 MG  tablet, Take 1 tablet by mouth daily., Disp: , Rfl:  .  melatonin 5 MG TABS, Take 1 tablet (5 mg total) by mouth at bedtime as needed., Disp: 30 tablet, Rfl: 0 .  metoprolol succinate (TOPROL-XL) 100 MG 24 hr tablet, Take 1.5 tablets by mouth daily., Disp: , Rfl:  .  amLODipine (NORVASC) 10 MG tablet, Take 1 tablet (10 mg total) by mouth daily. (Patient not taking: Reported on 11/02/2019), Disp: 90 tablet, Rfl: 1 .  axitinib (INLYTA) 5 MG tablet, Take 1 tablet (5 mg total) by mouth 2 (two) times daily. (Patient not taking: Reported on 11/02/2019), Disp: 60 tablet, Rfl: 0 .  furosemide (LASIX) 20 MG tablet, Take 1 tablet by mouth every morning (Patient not taking: Reported on 08/02/2019), Disp: , Rfl:  .   HYDROcodone-acetaminophen (NORCO/VICODIN) 5-325 MG tablet, Take 1 tablet by mouth every 4 (four) hours as needed for moderate pain. (Patient not taking: Reported on 08/23/2019), Disp: 12 tablet, Rfl: 0   Physical exam:  Vitals:   11/02/19 1435  BP: 118/85  Pulse: 81  Resp: 18  Temp: 98.6 F (37 C)  Weight: 164 lb 1.6 oz (74.4 kg)   Physical Exam Constitutional:      General: He is not in acute distress.    Appearance: He is not diaphoretic.  HENT:     Head: Normocephalic and atraumatic.     Nose: Nose normal.     Mouth/Throat:     Pharynx: No oropharyngeal exudate.  Eyes:     General: No scleral icterus.    Pupils: Pupils are equal, round, and reactive to light.  Cardiovascular:     Rate and Rhythm: Normal rate and regular rhythm.     Heart sounds: No murmur heard.   Pulmonary:     Effort: Pulmonary effort is normal. No respiratory distress.     Breath sounds: No rales.  Chest:     Chest wall: No tenderness.  Abdominal:     General: There is no distension.     Palpations: Abdomen is soft.     Tenderness: There is no abdominal tenderness.  Musculoskeletal:        General: Normal range of motion.     Cervical back: Normal range of motion and neck supple.  Skin:    General: Skin is warm and dry.     Findings: No erythema.  Neurological:     Mental Status: He is alert and oriented to person, place, and time.     Cranial Nerves: No cranial nerve deficit.     Motor: No abnormal muscle tone.     Coordination: Coordination normal.  Psychiatric:        Mood and Affect: Affect normal.        CMP Latest Ref Rng & Units 10/31/2019  Glucose 70 - 99 mg/dL 85  BUN 6 - 20 mg/dL 43(H)  Creatinine 0.61 - 1.24 mg/dL 2.20(H)  Sodium 135 - 145 mmol/L 136  Potassium 3.5 - 5.1 mmol/L 4.7  Chloride 98 - 111 mmol/L 103  CO2 22 - 32 mmol/L 26  Calcium 8.9 - 10.3 mg/dL 8.8(L)  Total Protein 6.5 - 8.1 g/dL -  Total Bilirubin 0.3 - 1.2 mg/dL -  Alkaline Phos 38 - 126 U/L -  AST  15 - 41 U/L -  ALT 0 - 44 U/L -   CBC Latest Ref Rng & Units 10/31/2019  WBC 4.0 - 10.5 K/uL 8.8  Hemoglobin 13.0 - 17.0 g/dL 14.5  Hematocrit  39 - 52 % 45.2  Platelets 150 - 400 K/uL 169    RADIOGRAPHIC STUDIES: I have personally reviewed the radiological images as listed and agreed with the findings in the report. No results found.  Assessment and plan-  Patient is a 58 y.o. male history of CAD, status post stent, CHF, history of ANCA positive sclerosing/crescentic GN, chronic kidney disease stage III presents for treatments of stage IV renal cell carcinoma 1. Renal cell carcinoma of left kidney (HCC)   2. Encounter for antineoplastic chemotherapy   3. Primary hypertension    # Stage IV renal cell carcinoma On immunotherapy with Keytruda and axitinib.  Axitinib was held due to uncontrolled hypertension last week.  Today blood pressure is better.  Patient is more compliant with blood pressure medications.  He also measures and monitors his blood pressure at home.  Systolic blood pressure ranges between 1 20-1 30. Advised patient to resume axitinib.  Continue monitoring blood pressure at home.  #History of diastolic CHF, 6/73/4193 LVEF 60 to 65%.  Lasix was held due to worsening of kidney function. Continue lisinopril and metoprolol.  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 Steva Ready.  Pending work-up, blood test to be done at [facility].Per ID, patient has had ophthalmology examination. .  Defer to infectious disease Dr. Steva Ready for management of his neurocysticercosis lesion to ID.  I discussed with Dr. Steva Ready today.  His test results still pending.  We spent sufficient time to discuss many aspect of care, questions were answered to patient's satisfaction. Follow-up in 2 weeks for follow-up of evaluation prior to immunotherapy. Thank you for allowing me to participate in the care of this patient.   Earlie Server, MD, PhD Hematology Oncology [facility] Pager- [phone] 11/02/2019

## 2019-11-15 ENCOUNTER — Inpatient Hospital Stay (HOSPITAL_BASED_OUTPATIENT_CLINIC_OR_DEPARTMENT_OTHER): Payer: Self-pay | Admitting: Oncology

## 2019-11-15 ENCOUNTER — Other Ambulatory Visit: Payer: Self-pay

## 2019-11-15 ENCOUNTER — Inpatient Hospital Stay: Payer: Self-pay

## 2019-11-15 ENCOUNTER — Encounter: Payer: Self-pay | Admitting: Oncology

## 2019-11-15 ENCOUNTER — Inpatient Hospital Stay: Payer: Self-pay | Attending: Oncology

## 2019-11-15 VITALS — BP 161/118 | HR 69 | Temp 96.9°F | Resp 18 | Wt 161.3 lb

## 2019-11-15 DIAGNOSIS — C642 Malignant neoplasm of left kidney, except renal pelvis: Secondary | ICD-10-CM

## 2019-11-15 DIAGNOSIS — I5042 Chronic combined systolic (congestive) and diastolic (congestive) heart failure: Secondary | ICD-10-CM | POA: Insufficient documentation

## 2019-11-15 DIAGNOSIS — E875 Hyperkalemia: Secondary | ICD-10-CM | POA: Insufficient documentation

## 2019-11-15 DIAGNOSIS — Z5111 Encounter for antineoplastic chemotherapy: Secondary | ICD-10-CM

## 2019-11-15 DIAGNOSIS — Z5112 Encounter for antineoplastic immunotherapy: Secondary | ICD-10-CM | POA: Insufficient documentation

## 2019-11-15 DIAGNOSIS — Z9861 Coronary angioplasty status: Secondary | ICD-10-CM | POA: Insufficient documentation

## 2019-11-15 DIAGNOSIS — Z8679 Personal history of other diseases of the circulatory system: Secondary | ICD-10-CM

## 2019-11-15 DIAGNOSIS — I2510 Atherosclerotic heart disease of native coronary artery without angina pectoris: Secondary | ICD-10-CM | POA: Insufficient documentation

## 2019-11-15 DIAGNOSIS — Z7982 Long term (current) use of aspirin: Secondary | ICD-10-CM | POA: Insufficient documentation

## 2019-11-15 DIAGNOSIS — Z905 Acquired absence of kidney: Secondary | ICD-10-CM | POA: Insufficient documentation

## 2019-11-15 DIAGNOSIS — I10 Essential (primary) hypertension: Secondary | ICD-10-CM

## 2019-11-15 DIAGNOSIS — I130 Hypertensive heart and chronic kidney disease with heart failure and stage 1 through stage 4 chronic kidney disease, or unspecified chronic kidney disease: Secondary | ICD-10-CM | POA: Insufficient documentation

## 2019-11-15 DIAGNOSIS — I255 Ischemic cardiomyopathy: Secondary | ICD-10-CM | POA: Insufficient documentation

## 2019-11-15 DIAGNOSIS — D631 Anemia in chronic kidney disease: Secondary | ICD-10-CM | POA: Insufficient documentation

## 2019-11-15 DIAGNOSIS — G939 Disorder of brain, unspecified: Secondary | ICD-10-CM

## 2019-11-15 DIAGNOSIS — B690 Cysticercosis of central nervous system: Secondary | ICD-10-CM | POA: Insufficient documentation

## 2019-11-15 DIAGNOSIS — Z79899 Other long term (current) drug therapy: Secondary | ICD-10-CM | POA: Insufficient documentation

## 2019-11-15 DIAGNOSIS — C771 Secondary and unspecified malignant neoplasm of intrathoracic lymph nodes: Secondary | ICD-10-CM | POA: Insufficient documentation

## 2019-11-15 DIAGNOSIS — N1832 Chronic kidney disease, stage 3b: Secondary | ICD-10-CM | POA: Insufficient documentation

## 2019-11-15 DIAGNOSIS — E785 Hyperlipidemia, unspecified: Secondary | ICD-10-CM | POA: Insufficient documentation

## 2019-11-15 LAB — CBC WITH DIFFERENTIAL/PLATELET
Abs Immature Granulocytes: 0.03 10*3/uL (ref 0.00–0.07)
Basophils Absolute: 0.1 10*3/uL (ref 0.0–0.1)
Basophils Relative: 1 %
Eosinophils Absolute: 0.2 10*3/uL (ref 0.0–0.5)
Eosinophils Relative: 3 %
HCT: 44.6 % (ref 39.0–52.0)
Hemoglobin: 15.1 g/dL (ref 13.0–17.0)
Immature Granulocytes: 0 %
Lymphocytes Relative: 19 %
Lymphs Abs: 1.6 10*3/uL (ref 0.7–4.0)
MCH: 28.2 pg (ref 26.0–34.0)
MCHC: 33.9 g/dL (ref 30.0–36.0)
MCV: 83.4 fL (ref 80.0–100.0)
Monocytes Absolute: 0.8 10*3/uL (ref 0.1–1.0)
Monocytes Relative: 9 %
Neutro Abs: 5.9 10*3/uL (ref 1.7–7.7)
Neutrophils Relative %: 68 %
Platelets: 176 10*3/uL (ref 150–400)
RBC: 5.35 MIL/uL (ref 4.22–5.81)
RDW: 13.1 % (ref 11.5–15.5)
WBC: 8.6 10*3/uL (ref 4.0–10.5)
nRBC: 0 % (ref 0.0–0.2)

## 2019-11-15 LAB — COMPREHENSIVE METABOLIC PANEL
ALT: 44 U/L (ref 0–44)
AST: 33 U/L (ref 15–41)
Albumin: 4 g/dL (ref 3.5–5.0)
Alkaline Phosphatase: 139 U/L — ABNORMAL HIGH (ref 38–126)
Anion gap: 8 (ref 5–15)
BUN: 32 mg/dL — ABNORMAL HIGH (ref 6–20)
CO2: 24 mmol/L (ref 22–32)
Calcium: 9.3 mg/dL (ref 8.9–10.3)
Chloride: 108 mmol/L (ref 98–111)
Creatinine, Ser: 2.1 mg/dL — ABNORMAL HIGH (ref 0.61–1.24)
GFR, Estimated: 36 mL/min — ABNORMAL LOW (ref >60)
Glucose, Bld: 76 mg/dL (ref 70–99)
Potassium: 5.3 mmol/L — ABNORMAL HIGH (ref 3.5–5.1)
Sodium: 140 mmol/L (ref 135–145)
Total Bilirubin: 0.6 mg/dL (ref 0.3–1.2)
Total Protein: 7.6 g/dL (ref 6.5–8.1)

## 2019-11-15 LAB — TSH: TSH: 2.997 u[IU]/mL (ref 0.350–4.500)

## 2019-11-15 MED ORDER — SODIUM CHLORIDE 0.9 % IV SOLN
200.0000 mg | Freq: Once | INTRAVENOUS | Status: AC
  Administered 2019-11-15: 200 mg via INTRAVENOUS
  Filled 2019-11-15: qty 8

## 2019-11-15 MED ORDER — SODIUM CHLORIDE 0.9% FLUSH
10.0000 mL | INTRAVENOUS | Status: DC | PRN
  Administered 2019-11-15: 10 mL
  Filled 2019-11-15: qty 10

## 2019-11-15 MED ORDER — SODIUM CHLORIDE 0.9 % IV SOLN
Freq: Once | INTRAVENOUS | Status: AC
  Filled 2019-11-15: qty 250

## 2019-11-15 NOTE — Progress Notes (Signed)
Hematology/Oncology Progress Note [facility] Telephone:([phone] [phone]  Patient Care Team: Earlie Server, MD as PCP - General (Oncology)   Name of the patient: Mark Donaldson  [id number]  1960/06/15  Date of visit: 11/15/19   INTERVAL HISTORY-  59 y.o. male presents for follow-up of renal cell carcinoma treatments. Patient has past medical history CAD status post PCI to LAD in 2018, on aspirin and Plavix, CHF, history of focal sclerosing/crescentic GN-ANCA positive and CKD stage III. He was hospitalized from 05/02/2019-05/07/2019 due to gross hematuria. CT showed a 7.4 cm left lower pole renal mass concerning for RCC.  Patient also has necrotic lymphadenopathy in the mediastinum and the right supra hilar region which were highly suspicious for metastatic disease.  Bilateral lung nodules, also concerning for metastatic lung disease.  Right retrocrural lymphadenopathy with upper normal left periaortic lymph node. Patient was recommended for cytoreductive radical nephrectomy.  With history of ANCA positive vasculitis, also recommend thoracic lymphadenopathy biopsy to confirm distal metastasis.  Patient agreed to radical nephrectomy and declined bronchoscopy biopsy. He underwent left radical nephrectomy on 05/05/2019. Pathology showed pT3a pNx, RCC, conventional clear cell type, grade 3 Patient was discharged home. Today he presents to discuss pathology and the management plan. He was accompanied by his wife and daughter. Patient reports that he uses hydrocodone for pain.  Pain around the surgical site has improved.  He occasionally has tingling sensation around the surgical site.  Denies any fever or chills, drainage from the surgical site.  INTERVAL HISTORY Mark Donaldson is a 59 y.o. male who has above history reviewed by me today presents for follow up visit for management of metastatic RCC Problems and complaints are listed  below: Patient is Spanish-speaking.  Spanish interpreter present for the entire encounter for translation He denies any new complaint. BP is high today in the clinic. He reports measuring his BP at home which all readings are within normal limits.  He reports occasionally SOB at night when sleeping.    Review of systems- Review of Systems  Constitutional: Negative for appetite change, chills, fatigue, fever and unexpected weight change.  HENT:   Negative for hearing loss and voice change.   Eyes: Negative for eye problems and icterus.  Respiratory: Negative for chest tightness and cough.        Occasionally SOB at night  Cardiovascular: Negative for chest pain and leg swelling.  Gastrointestinal: Negative for abdominal distention and abdominal pain.  Endocrine: Negative for hot flashes.  Genitourinary: Negative for difficulty urinating, dysuria and frequency.   Musculoskeletal: Negative for arthralgias.  Skin: Negative for itching and rash.  Neurological: Negative for light-headedness and numbness.  Hematological: Negative for adenopathy. Does not bruise/bleed easily.  Psychiatric/Behavioral: Negative for confusion.    No Known Allergies  Patient Active Problem List   Diagnosis Date Noted  . Stage 3b chronic kidney disease ([place]) 10/04/2019  . Brain lesion 10/04/2019  . Encounter for antineoplastic immunotherapy 07/19/2019  . Encounter for antineoplastic chemotherapy 07/19/2019  . CNS lesion 07/07/2019  . Anemia in stage 3b chronic kidney disease ([place]) 06/07/2019  . Goals of care, counseling/discussion 05/23/2019  . Renal cell carcinoma of left kidney ([place]) 05/23/2019  . Renal cell carcinoma ([place]) 05/23/2019  . Palliative care encounter   . History of vasculitis   . Thoracic lymphadenopathy   . Lung nodule   . CAD S/P percutaneous coronary angioplasty 05/02/2019  . Kidney mass 05/02/2019  . Hydronephrosis of left kidney 05/02/2019  .  UTI (urinary tract infection) 05/02/2019   . AKI (acute kidney injury) ([place]) 05/02/2019  . Gross hematuria 05/02/2019  . Chronic diastolic CHF (congestive heart failure) ([place]) 05/02/2019  . Sepsis ([place]) 05/02/2019  . Severe sepsis ([place]) 05/02/2019  . Preop cardiovascular exam   . Hyperlipidemia 05/21/2016  . 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 Prediabetes 05/21/2016  . Alcohol abuse 05/18/2016  . Essential hypertension 05/18/2016  . Vasculitis, 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[place]) 05/18/2016  . Shortness of breath 05/17/2016     Past Medical History:  Diagnosis Date  . ANCA-positive vasculitis ([place])   . CAD (coronary artery disease)   . Essential hypertension   . HFrEF (heart failure with reduced ejection fraction) ([place])   . Hyperlipidemia LDL goal <70   . Ischemic cardiomyopathy   . Renal cell carcinoma ([place]) 05/23/2019  . Renal disorder      Past Surgical History:  Procedure Laterality Date  . CARDIAC SURGERY     cardiac cath with 2 stent placement  . LAPAROSCOPIC NEPHRECTOMY, HAND ASSISTED Left 05/05/2019   Procedure: HAND ASSISTED LAPAROSCOPIC NEPHRECTOMY;  Surgeon: Hollice Espy, MD;  Location: [facility] ORS;  Service: Urology;  Laterality: Left;    Social History   Socioeconomic History  . Marital status: Married    Spouse name: Not on file  . Number of children: Not on file  . Years of education: Not on file  . Highest education level: Not on file  Occupational History  . Not on file  Tobacco Use  . Smoking status: Never Smoker  . Smokeless tobacco: Never Used  Substance and Sexual Activity  . Alcohol use: Yes  . Drug use: Not on file  . Sexual activity: Not on file  Other Topics Concern  . Not on file  Social History Narrative  . Not on file   Social Determinants of Health   Financial Resource Strain:   . Difficulty of Paying Living Expenses: Not on file  Food Insecurity:   . Worried About Charity fundraiser in the Last Year: Not on file  . Ran Out of Food in the Last  Year: Not on file  Transportation Needs:   . Lack of Transportation (Medical): Not on file  . Lack of Transportation (Non-Medical): Not on file  Physical Activity:   . Days of Exercise per Week: Not on file  . Minutes of Exercise per Session: Not on file  Stress:   . Feeling of Stress : Not on file  Social Connections:   . Frequency of Communication with Friends and Family: Not on file  . Frequency of Social Gatherings with Friends and Family: Not on file  . Attends Religious Services: Not on file  . Active Member of Clubs or Organizations: Not on file  . Attends Archivist Meetings: Not on file  . Marital Status: Not on file  Intimate Partner Violence:   . Fear of Current or Ex-Partner: Not on file  . Emotionally Abused: Not on file  . Physically Abused: Not on file  . Sexually Abused: Not on file     Family History  Problem Relation Age of Onset  . Cancer Mother   . Blindness Brother      Current Outpatient Medications:  .  amLODipine (NORVASC) 10 MG tablet, Take 1 tablet (10 mg total) by mouth daily., Disp: 90 tablet, Rfl: 1 .  aspirin 81 MG chewable tablet, Chew 1 tablet by mouth daily., Disp: , Rfl:  .  atorvastatin (LIPITOR) 80 MG tablet, Take 80 mg by mouth daily., Disp: , Rfl:  .  axitinib (INLYTA) 5 MG tablet, Take 1 tablet (5 mg total) by mouth 2 (two) times daily., Disp: 60 tablet, Rfl: 0 .  clopidogrel (PLAVIX) 75 MG tablet, Take 1 tablet by mouth daily., Disp: , Rfl:  .  lisinopril (ZESTRIL) 10 MG tablet, Take 1 tablet by mouth daily., Disp: , Rfl:  .  metoprolol succinate (TOPROL-XL) 100 MG 24 hr tablet, Take 1.5 tablets by mouth daily., Disp: , Rfl:  .  furosemide (LASIX) 20 MG tablet, Take 1 tablet by mouth every morning (Patient not taking: Reported on 08/02/2019), Disp: , Rfl:  .  HYDROcodone-acetaminophen (NORCO/VICODIN) 5-325 MG tablet, Take 1 tablet by mouth every 4 (four) hours as needed for moderate pain. (Patient not taking: Reported on  08/23/2019), Disp: 12 tablet, Rfl: 0 .  melatonin 5 MG TABS, Take 1 tablet (5 mg total) by mouth at bedtime as needed. (Patient not taking: Reported on 11/15/2019), Disp: 30 tablet, Rfl: 0   Physical exam:  Vitals:   11/15/19 1319 11/15/19 1321  BP: (!) 162/116 (!) 161/118  Pulse: 68 69  Resp: 18   Temp: (!) 96.9 F (36.1 C)   Weight: 161 lb 4.8 oz (73.2 kg)    Physical Exam Constitutional:      General: He is not in acute distress.    Appearance: He is not diaphoretic.  HENT:     Head: Normocephalic and atraumatic.     Nose: Nose normal.     Mouth/Throat:     Pharynx: No oropharyngeal exudate.  Eyes:     General: No scleral icterus.    Pupils: Pupils are equal, round, and reactive to light.  Cardiovascular:     Rate and Rhythm: Normal rate and regular rhythm.     Heart sounds: No murmur heard.   Pulmonary:     Effort: Pulmonary effort is normal. No respiratory distress.     Breath sounds: No rales.  Chest:     Chest wall: No tenderness.  Abdominal:     General: There is no distension.     Palpations: Abdomen is soft.     Tenderness: There is no abdominal tenderness.  Musculoskeletal:        General: Normal range of motion.     Cervical back: Normal range of motion and neck supple.  Skin:    General: Skin is warm and dry.     Findings: No erythema.  Neurological:     Mental Status: He is alert and oriented to person, place, and time.     Cranial Nerves: No cranial nerve deficit.     Motor: No abnormal muscle tone.     Coordination: Coordination normal.  Psychiatric:        Mood and Affect: Affect normal.        CMP Latest Ref Rng & Units 11/15/2019  Glucose 70 - 99 mg/dL 76  BUN 6 - 20 mg/dL 32(H)  Creatinine 0.61 - 1.24 mg/dL 2.10(H)  Sodium 135 - 145 mmol/L 140  Potassium 3.5 - 5.1 mmol/L 5.3(H)  Chloride 98 - 111 mmol/L 108  CO2 22 - 32 mmol/L 24  Calcium 8.9 - 10.3 mg/dL 9.3  Total Protein 6.5 - 8.1 g/dL 7.6  Total Bilirubin 0.3 - 1.2 mg/dL 0.6   Alkaline Phos 38 - 126 U/L 139(H)  AST 15 - 41 U/L 33  ALT 0 - 44 U/L 44   CBC Latest  Ref Rng & Units 11/15/2019  WBC 4.0 - 10.5 K/uL 8.6  Hemoglobin 13.0 - 17.0 g/dL 15.1  Hematocrit 39 - 52 % 44.6  Platelets 150 - 400 K/uL 176    RADIOGRAPHIC STUDIES: I have personally reviewed the radiological images as listed and agreed with the findings in the report. NM PET Image Initial (PI) Skull Base To Thigh  Result Date: 09/08/2019 CLINICAL DATA:  Subsequent treatment strategy for renal cell carcinoma. Mediastinal nodal metastasis. EXAM: NUCLEAR MEDICINE PET SKULL BASE TO THIGH TECHNIQUE: 8.8 mCi F-18 FDG was injected intravenously. Full-ring PET imaging was performed from the skull base to thigh after the radiotracer. CT data was obtained and used for attenuation correction and anatomic localization. Fasting blood glucose: 100 mg/dl COMPARISON:  CT 05/03/2019 FINDINGS: Mediastinal blood pool activity: SUV max 2.3 Liver activity: SUV max NA NECK: No hypermetabolic lymph nodes in the neck. Incidental CT findings: none CHEST: Small hypermetabolic nodule along the pleural surface of the anterior mediastinum measures SUV max 6.4. This activity corresponds to a RIGHT pleural base nodule measuring 14 mm x 9 mm on image 90/3. This is at the same site of previously enlarged necrotic lymph nodes on CT 05/03/2019. The majority of the previously seen nodes have regressed with only this solitary hypermetabolic nodule remaining. Incidental CT findings: 9 mm nodule along the RIGHT oblique fissure (image 104/3) is decreased from 16 mm on prior and has very low metabolic activity SUV max equal 1.2. ABDOMEN/PELVIS: Post LEFT nephrectomy anatomy. No hypermetabolic nodularity in the nephrectomy bed. No nodularity on the CT portion exam. No evidence of a task cysts in the abdomen pelvis. Physiologic activity within the stomach. Intense activity in the stomach is favored physiologic. Incidental CT findings: none SKELETON: No  focal hypermetabolic activity to suggest skeletal metastasis. Incidental CT findings: none IMPRESSION: 1. In the anterior mediastinum, there is residual hypermetabolic nodule along the RIGHT pleural surface at site of previous seen multiple metastatic nodules. 2. Interval decrease in size of RIGHT lung pulmonary nodule with minimal metabolic activity. 3. Post LEFT nephrectomy without evidence residual carcinoma. Electronically Signed   By: Suzy Bouchard M.D.   On: 09/08/2019 12:27    Assessment and plan-  Patient is a 59 y.o. male history of CAD, status post stent, CHF, history of ANCA positive sclerosing/crescentic GN, chronic kidney disease stage III presents for treatments of stage IV renal cell carcinoma 1. Renal cell carcinoma of left kidney (HCC)   2. Encounter for antineoplastic chemotherapy   3. Primary hypertension   4. Encounter for antineoplastic immunotherapy   5. Brain lesion   6. History of CHF (congestive heart failure)    # Stage IV renal cell carcinoma Labs are reviewed and discussed with patient. Proceed with Keytruda today.   # Hypertension.  Continue metoprolol, lisinopril.  Add back Lasix 20mg  every other day.   # Hyperkalemia, recommend decrease potassium rich food intake.  Monitor. Resume Lasix which may decrease K level.   # Night time SOB,  History of diastolic CHF, 9/50/9326 LVEF 60 to 65%.  Lasix was previously held due to worsening of kidney function. Resume lasix 20mg , every other day. He is not following cardiology due to lack of insurance.   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 Steva Ready.  Pending work-up, blood test [facility].Per ID, patient has had ophthalmology examination. .  Defer to infectious disease Dr. Steva Ready for management of his neurocysticercosis lesion to ID.  I discussed with Dr.  Ravisankar   His test results still pending.  We spent sufficient time to discuss many aspect of care, questions were  answered to patient's satisfaction. Follow-up in 3 weeks for follow-up of evaluation prior to immunotherapy. Thank you for allowing me to participate in the care of this patient.   Earlie Server, MD, PhD Hematology Oncology [facility] Pager- [phone] 11/15/2019

## 2019-11-15 NOTE — Progress Notes (Signed)
Pt here for follow up. No new concerns voiced. BP elevated, denies headache or dizziness. Pt reports taking BP med this morning. Pt has been taking BP at home and it has been in the 120s/ 80s.

## 2019-11-15 NOTE — Progress Notes (Signed)
Spanish interpreter present- patient with no complaints or questions today. Pembrolizumab infusion well tolerated. Discharged home in stable condition.

## 2019-12-06 ENCOUNTER — Inpatient Hospital Stay (HOSPITAL_BASED_OUTPATIENT_CLINIC_OR_DEPARTMENT_OTHER): Payer: Self-pay | Admitting: Oncology

## 2019-12-06 ENCOUNTER — Encounter: Payer: Self-pay | Admitting: Pharmacist

## 2019-12-06 ENCOUNTER — Other Ambulatory Visit: Payer: Self-pay | Admitting: Oncology

## 2019-12-06 ENCOUNTER — Other Ambulatory Visit: Payer: Self-pay

## 2019-12-06 ENCOUNTER — Inpatient Hospital Stay: Payer: Self-pay

## 2019-12-06 VITALS — BP 110/72 | HR 80 | Temp 97.0°F | Resp 17 | Wt 164.0 lb

## 2019-12-06 DIAGNOSIS — C642 Malignant neoplasm of left kidney, except renal pelvis: Secondary | ICD-10-CM

## 2019-12-06 LAB — CBC WITH DIFFERENTIAL/PLATELET
Abs Immature Granulocytes: 0.04 10*3/uL (ref 0.00–0.07)
Basophils Absolute: 0.1 10*3/uL (ref 0.0–0.1)
Basophils Relative: 1 %
Eosinophils Absolute: 0.3 10*3/uL (ref 0.0–0.5)
Eosinophils Relative: 3 %
HCT: 43.4 % (ref 39.0–52.0)
Hemoglobin: 14.3 g/dL (ref 13.0–17.0)
Immature Granulocytes: 0 %
Lymphocytes Relative: 22 %
Lymphs Abs: 2.1 10*3/uL (ref 0.7–4.0)
MCH: 28.2 pg (ref 26.0–34.0)
MCHC: 32.9 g/dL (ref 30.0–36.0)
MCV: 85.6 fL (ref 80.0–100.0)
Monocytes Absolute: 0.8 10*3/uL (ref 0.1–1.0)
Monocytes Relative: 9 %
Neutro Abs: 6.4 10*3/uL (ref 1.7–7.7)
Neutrophils Relative %: 65 %
Platelets: 158 10*3/uL (ref 150–400)
RBC: 5.07 MIL/uL (ref 4.22–5.81)
RDW: 13.3 % (ref 11.5–15.5)
WBC: 9.7 10*3/uL (ref 4.0–10.5)
nRBC: 0 % (ref 0.0–0.2)

## 2019-12-06 LAB — COMPREHENSIVE METABOLIC PANEL
ALT: 20 U/L (ref 0–44)
AST: 21 U/L (ref 15–41)
Albumin: 3.8 g/dL (ref 3.5–5.0)
Alkaline Phosphatase: 99 U/L (ref 38–126)
Anion gap: 11 (ref 5–15)
BUN: 42 mg/dL — ABNORMAL HIGH (ref 6–20)
CO2: 23 mmol/L (ref 22–32)
Calcium: 9.3 mg/dL (ref 8.9–10.3)
Chloride: 104 mmol/L (ref 98–111)
Creatinine, Ser: 1.97 mg/dL — ABNORMAL HIGH (ref 0.61–1.24)
GFR, Estimated: 39 mL/min — ABNORMAL LOW (ref >60)
Glucose, Bld: 179 mg/dL — ABNORMAL HIGH (ref 70–99)
Potassium: 4.3 mmol/L (ref 3.5–5.1)
Sodium: 138 mmol/L (ref 135–145)
Total Bilirubin: 0.6 mg/dL (ref 0.3–1.2)
Total Protein: 7 g/dL (ref 6.5–8.1)

## 2019-12-06 MED ORDER — SODIUM CHLORIDE 0.9 % IV SOLN
200.0000 mg | Freq: Once | INTRAVENOUS | Status: AC
  Administered 2019-12-06: 200 mg via INTRAVENOUS
  Filled 2019-12-06: qty 8

## 2019-12-06 MED ORDER — SODIUM CHLORIDE 0.9 % IV SOLN
Freq: Once | INTRAVENOUS | Status: AC
  Filled 2019-12-06: qty 250

## 2019-12-06 NOTE — Progress Notes (Signed)
1250- Patient tolerated treatment well. Patient discharged to home at this time. 

## 2019-12-06 NOTE — Progress Notes (Signed)
Oral Chemotherapy Pharmacist Encounter  Dispensed samples to patient:  Medication: Inlyta (axitinib) Instructions: Take 1 tablet (5 mg total) by mouth 2 (two) times daily Quantity dispensed: 30 Days supply: 15 Manufacturer: Pfizer Lot: [id number] Exp: 02/2020  Received notification from symptom management team that patient has been out of his axitinib for 3 days. Patient enrolled in manufacturer assistance but per RN Sharyn Lull when he called for a refill he was told he was out of refills. Patient provided with samples today, while his assistance refill is worked out.  Darl Pikes, PharmD, BCPS, [facility] Hematology/Oncology Clinical Pharmacist [facility] [phone]  12/06/2019 11:01 AM

## 2019-12-06 NOTE — Progress Notes (Signed)
[place], Interpreter present for clinic visit. Here for immunotherapy. Ran out of Inlyta medication 3 days ago and states MD needs to send refill into pharmacy. Discussed with Ebony Hail pharmacist. She is going to give him enough tablets ( #30, 15 day supply) until Dr Tasia Catchings is back and can send prescription refill. Has good days and bad days. Has fatigue. Hoarseness is side effect of oral chemo. Appetite is good. BM's regular.  Denies neuropathy. Rare back discomfort. None today. Voiding well. No blood in urine. BP is normal today.

## 2019-12-06 NOTE — Progress Notes (Signed)
Hematology/Oncology Progress Note [facility] / [facility] Telephone:([phone] [phone]  Patient Care Team: Earlie Server, MD as PCP - General (Oncology)   Name of the patient: Mark Donaldson  [id number]  01-02-1961  Date of visit: 12/06/19   INTERVAL HISTORY-  59 y.o. male presents for follow-up of renal cell carcinoma treatments. Patient has past medical history CAD status post PCI to LAD in 2018, on aspirin and Plavix, CHF, history of focal sclerosing/crescentic GN-ANCA positive and CKD stage III. He was hospitalized from 05/02/2019-05/07/2019 due to gross hematuria. CT showed a 7.4 cm left lower pole renal mass concerning for RCC.  Patient also has necrotic lymphadenopathy in the mediastinum and the right supra hilar region which were highly suspicious for metastatic disease.  Bilateral lung nodules, also concerning for metastatic lung disease.  Right retrocrural lymphadenopathy with upper normal left periaortic lymph node. Patient was recommended for cytoreductive radical nephrectomy.  With history of ANCA positive vasculitis, also recommend thoracic lymphadenopathy biopsy to confirm distal metastasis.  Patient agreed to radical nephrectomy and declined bronchoscopy biopsy. He underwent left radical nephrectomy on 05/05/2019. Pathology showed pT3a pNx, RCC, conventional clear cell type, grade 3 Patient was discharged home. Today he presents to discuss pathology and the management plan. He was accompanied by his wife and daughter. Patient reports that he uses hydrocodone for pain.  Pain around the surgical site has improved.  He occasionally has tingling sensation around the surgical site.  Denies any fever or chills, drainage from the surgical site.  INTERVAL HISTORY Mark Donaldson is a 59 y.o. male who has above history reviewed by me today presents for follow up visit for management of metastatic RCC Problems and complaints are listed  below: Patient is Spanish-speaking.  Spanish interpreter present for the entire encounter for translation. He denies any new complaints. Has chronic persistent "sore throat" while on Inlyta. He has been out of his Inlyta for 3 days. He has some shortness of breath at bedtime. Denies any arthralgias or myalgias. Denies any stomatitis or mucositis. Has fatigue but overall feels well.  Review of systems- Review of Systems  Constitutional: Positive for fatigue. Negative for appetite change, chills, fever and unexpected weight change.  HENT:   Negative for hearing loss and voice change.   Eyes: Negative for eye problems and icterus.  Respiratory: Negative for chest tightness and cough.        Occasionally SOB at night  Cardiovascular: Negative for chest pain and leg swelling.  Gastrointestinal: Negative for abdominal distention and abdominal pain.  Endocrine: Negative for hot flashes.  Genitourinary: Negative for difficulty urinating, dysuria and frequency.   Musculoskeletal: Negative for arthralgias.  Skin: Negative for itching and rash.  Neurological: Negative for light-headedness and numbness.  Hematological: Negative for adenopathy. Does not bruise/bleed easily.  Psychiatric/Behavioral: Negative for confusion.    No Known Allergies  Patient Active Problem List   Diagnosis Date Noted  . Stage 3b chronic kidney disease ([place]) 10/04/2019  . Brain lesion 10/04/2019  . Encounter for antineoplastic immunotherapy 07/19/2019  . Encounter for antineoplastic chemotherapy 07/19/2019  . CNS lesion 07/07/2019  . Anemia in stage 3b chronic kidney disease ([place]) 06/07/2019  . Goals of care, counseling/discussion 05/23/2019  . Renal cell carcinoma of left kidney ([place]) 05/23/2019  . Renal cell carcinoma ([place]) 05/23/2019  . Palliative care encounter   . History of vasculitis   . Thoracic lymphadenopathy   . Lung nodule   . CAD S/P percutaneous coronary angioplasty 05/02/2019  .  Kidney mass  05/02/2019  . Hydronephrosis of left kidney 05/02/2019  . UTI (urinary tract infection) 05/02/2019  . AKI (acute kidney injury) ([place]) 05/02/2019  . Gross hematuria 05/02/2019  . Chronic diastolic CHF (congestive heart failure) ([place]) 05/02/2019  . Sepsis ([place]) 05/02/2019  . Severe sepsis ([place]) 05/02/2019  . Preop cardiovascular exam   . Hyperlipidemia 05/21/2016  . 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 Prediabetes 05/21/2016  . Alcohol abuse 05/18/2016  . Essential hypertension 05/18/2016  . Vasculitis, 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[place]) 05/18/2016  . Shortness of breath 05/17/2016     Past Medical History:  Diagnosis Date  . ANCA-positive vasculitis ([place])   . CAD (coronary artery disease)   . Essential hypertension   . HFrEF (heart failure with reduced ejection fraction) ([place])   . Hyperlipidemia LDL goal <70   . Ischemic cardiomyopathy   . Renal cell carcinoma ([place]) 05/23/2019  . Renal disorder      Past Surgical History:  Procedure Laterality Date  . CARDIAC SURGERY     cardiac cath with 2 stent placement  . LAPAROSCOPIC NEPHRECTOMY, HAND ASSISTED Left 05/05/2019   Procedure: HAND ASSISTED LAPAROSCOPIC NEPHRECTOMY;  Surgeon: Hollice Espy, MD;  Location: [facility] ORS;  Service: Urology;  Laterality: Left;    Social History   Socioeconomic History  . Marital status: Married    Spouse name: Not on file  . Number of children: Not on file  . Years of education: Not on file  . Highest education level: Not on file  Occupational History  . Not on file  Tobacco Use  . Smoking status: Never Smoker  . Smokeless tobacco: Never Used  Substance and Sexual Activity  . Alcohol use: Yes  . Drug use: Not on file  . Sexual activity: Not on file  Other Topics Concern  . Not on file  Social History Narrative  . Not on file   Social Determinants of Health   Financial Resource Strain:   . Difficulty of Paying Living Expenses: Not on file  Food Insecurity:    . Worried About Charity fundraiser in the Last Year: Not on file  . Ran Out of Food in the Last Year: Not on file  Transportation Needs:   . Lack of Transportation (Medical): Not on file  . Lack of Transportation (Non-Medical): Not on file  Physical Activity:   . Days of Exercise per Week: Not on file  . Minutes of Exercise per Session: Not on file  Stress:   . Feeling of Stress : Not on file  Social Connections:   . Frequency of Communication with Friends and Family: Not on file  . Frequency of Social Gatherings with Friends and Family: Not on file  . Attends Religious Services: Not on file  . Active Member of Clubs or Organizations: Not on file  . Attends Archivist Meetings: Not on file  . Marital Status: Not on file  Intimate Partner Violence:   . Fear of Current or Ex-Partner: Not on file  . Emotionally Abused: Not on file  . Physically Abused: Not on file  . Sexually Abused: Not on file     Family History  Problem Relation Age of Onset  . Cancer Mother   . Blindness Brother      Current Outpatient Medications:  .  aspirin 81 MG chewable tablet, Chew 1 tablet by mouth daily., Disp: , Rfl:  .  atorvastatin (LIPITOR) 80 MG tablet, Take 80 mg  by mouth daily., Disp: , Rfl:  .  axitinib (INLYTA) 5 MG tablet, Take 1 tablet (5 mg total) by mouth 2 (two) times daily., Disp: 60 tablet, Rfl: 0 .  clopidogrel (PLAVIX) 75 MG tablet, Take 1 tablet by mouth daily., Disp: , Rfl:  .  furosemide (LASIX) 20 MG tablet, 20 mg every other day. , Disp: , Rfl:  .  lisinopril (ZESTRIL) 10 MG tablet, Take 1 tablet by mouth daily., Disp: , Rfl:  .  metoprolol succinate (TOPROL-XL) 100 MG 24 hr tablet, Take 1.5 tablets by mouth daily., Disp: , Rfl:  .  amLODipine (NORVASC) 10 MG tablet, Take 1 tablet (10 mg total) by mouth daily., Disp: 90 tablet, Rfl: 1 .  HYDROcodone-acetaminophen (NORCO/VICODIN) 5-325 MG tablet, Take 1 tablet by mouth every 4 (four) hours as needed for moderate  pain. (Patient not taking: Reported on 08/23/2019), Disp: 12 tablet, Rfl: 0 .  melatonin 5 MG TABS, Take 1 tablet (5 mg total) by mouth at bedtime as needed. (Patient not taking: Reported on 11/15/2019), Disp: 30 tablet, Rfl: 0   Physical exam:  There were no vitals filed for this visit. Physical Exam Constitutional:      General: He is not in acute distress.    Appearance: He is not diaphoretic.  HENT:     Head: Normocephalic and atraumatic.     Nose: Nose normal.     Mouth/Throat:     Pharynx: No oropharyngeal exudate.  Eyes:     General: No scleral icterus.    Pupils: Pupils are equal, round, and reactive to light.  Cardiovascular:     Rate and Rhythm: Normal rate and regular rhythm.     Heart sounds: No murmur heard.   Pulmonary:     Effort: Pulmonary effort is normal. No respiratory distress.     Breath sounds: No rales.  Chest:     Chest wall: No tenderness.  Abdominal:     General: There is no distension.     Palpations: Abdomen is soft.     Tenderness: There is no abdominal tenderness.  Musculoskeletal:        General: Normal range of motion.     Cervical back: Normal range of motion and neck supple.  Skin:    General: Skin is warm and dry.     Findings: No erythema.  Neurological:     Mental Status: He is alert and oriented to person, place, and time.     Cranial Nerves: No cranial nerve deficit.     Motor: No abnormal muscle tone.     Coordination: Coordination normal.  Psychiatric:        Mood and Affect: Affect normal.        CMP Latest Ref Rng & Units 12/06/2019  Glucose 70 - 99 mg/dL 179(H)  BUN 6 - 20 mg/dL 42(H)  Creatinine 0.61 - 1.24 mg/dL 1.97(H)  Sodium 135 - 145 mmol/L 138  Potassium 3.5 - 5.1 mmol/L 4.3  Chloride 98 - 111 mmol/L 104  CO2 22 - 32 mmol/L 23  Calcium 8.9 - 10.3 mg/dL 9.3  Total Protein 6.5 - 8.1 g/dL 7.0  Total Bilirubin 0.3 - 1.2 mg/dL 0.6  Alkaline Phos 38 - 126 U/L 99  AST 15 - 41 U/L 21  ALT 0 - 44 U/L 20   CBC  Latest Ref Rng & Units 12/06/2019  WBC 4.0 - 10.5 K/uL 9.7  Hemoglobin 13.0 - 17.0 g/dL 14.3  Hematocrit 39 - 52 % 43.4  Platelets 150 - 400 K/uL 158    RADIOGRAPHIC STUDIES: I have personally reviewed the radiological images as listed and agreed with the findings in the report. NM PET Image Initial (PI) Skull Base To Thigh  Result Date: 09/08/2019 CLINICAL DATA:  Subsequent treatment strategy for renal cell carcinoma. Mediastinal nodal metastasis. EXAM: NUCLEAR MEDICINE PET SKULL BASE TO THIGH TECHNIQUE: 8.8 mCi F-18 FDG was injected intravenously. Full-ring PET imaging was performed from the skull base to thigh after the radiotracer. CT data was obtained and used for attenuation correction and anatomic localization. Fasting blood glucose: 100 mg/dl COMPARISON:  CT 05/03/2019 FINDINGS: Mediastinal blood pool activity: SUV max 2.3 Liver activity: SUV max NA NECK: No hypermetabolic lymph nodes in the neck. Incidental CT findings: none CHEST: Small hypermetabolic nodule along the pleural surface of the anterior mediastinum measures SUV max 6.4. This activity corresponds to a RIGHT pleural base nodule measuring 14 mm x 9 mm on image 90/3. This is at the same site of previously enlarged necrotic lymph nodes on CT 05/03/2019. The majority of the previously seen nodes have regressed with only this solitary hypermetabolic nodule remaining. Incidental CT findings: 9 mm nodule along the RIGHT oblique fissure (image 104/3) is decreased from 16 mm on prior and has very low metabolic activity SUV max equal 1.2. ABDOMEN/PELVIS: Post LEFT nephrectomy anatomy. No hypermetabolic nodularity in the nephrectomy bed. No nodularity on the CT portion exam. No evidence of a task cysts in the abdomen pelvis. Physiologic activity within the stomach. Intense activity in the stomach is favored physiologic. Incidental CT findings: none SKELETON: No focal hypermetabolic activity to suggest skeletal metastasis. Incidental CT findings:  none IMPRESSION: 1. In the anterior mediastinum, there is residual hypermetabolic nodule along the RIGHT pleural surface at site of previous seen multiple metastatic nodules. 2. Interval decrease in size of RIGHT lung pulmonary nodule with minimal metabolic activity. 3. Post LEFT nephrectomy without evidence residual carcinoma. Electronically Signed   By: Suzy Bouchard M.D.   On: 09/08/2019 12:27    Assessment and plan-  Patient is a 59 y.o. male history of CAD, status post stent, CHF, history of ANCA positive sclerosing/crescentic GN, chronic kidney disease stage III presents for treatments of stage IV renal cell carcinoma No diagnosis found.   # Stage IV renal cell carcinoma Labs are reviewed and discussed with patient. Creatinine has improved 1.97 today (2.10) TSH normal on 11/15/19.  Proceed with Keytruda today.  He is currently out at [facility] and has not had it for 3 days. A 2-week supply will be given to him by Nuala Alpha, oral chemotherapy pharmacist today. We will have Dr. Tasia Catchings refill when she returns.  # Hypertension.  Continue metoprolol, lisinopril.  Continue Lasix 20mg  every other day.   # Hyperkalemia, recommend decrease potassium rich food intake.  Improved today. Potassium within normal limits. K 4.3.  # Night time SOB,  History of diastolic CHF, 7/56/4332 LVEF 60 to 65%.  Lasix was previously held due to worsening of kidney function. Continue lasix 20mg , every other day. He is not following cardiology due to lack of insurance.   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 Steva Ready.  Pending work-up, blood test to be done at [facility].Per ID, patient has had ophthalmology examination. .  Defer to infectious disease Dr. Steva Ready for management of his neurocysticercosis lesion to ID.   We spent sufficient time to discuss many aspect of care, questions were answered to patient's satisfaction.  Disposition: Follow-up in 3 weeks  for follow-up of evaluation  prior to immunotherapy.  Thank you for allowing me to participate in the care of this patient.   Faythe Casa, NP 12/06/2019 11:10 AM

## 2019-12-07 ENCOUNTER — Other Ambulatory Visit: Payer: Self-pay

## 2019-12-07 NOTE — Telephone Encounter (Signed)
Will enter refill request and submit to MD for approval next week.

## 2019-12-07 NOTE — Telephone Encounter (Signed)
-----   Message from Mayer Camel, RN sent at 12/06/2019 10:58 AM EST ----- Here for immunotherapy. Ran out of Inlyta medication 3 days ago and states MD needs to send refill into pharmacy. Discussed with Ebony Hail pharmacist. She is going to give him enough tablets ( #30, 15 day supply) until Dr Tasia Catchings is back and can send prescription refill to pharmacy.

## 2019-12-12 MED ORDER — AXITINIB 5 MG PO TABS
5.0000 mg | ORAL_TABLET | Freq: Two times a day (BID) | ORAL | 0 refills | Status: DC
Start: 2019-12-12 — End: 2019-12-27

## 2019-12-26 ENCOUNTER — Other Ambulatory Visit: Payer: Self-pay

## 2019-12-26 DIAGNOSIS — C642 Malignant neoplasm of left kidney, except renal pelvis: Secondary | ICD-10-CM

## 2019-12-27 ENCOUNTER — Other Ambulatory Visit: Payer: Self-pay

## 2019-12-27 ENCOUNTER — Inpatient Hospital Stay (HOSPITAL_BASED_OUTPATIENT_CLINIC_OR_DEPARTMENT_OTHER): Payer: Self-pay | Admitting: Oncology

## 2019-12-27 ENCOUNTER — Inpatient Hospital Stay: Payer: Self-pay | Attending: Oncology

## 2019-12-27 ENCOUNTER — Encounter: Payer: Self-pay | Admitting: Oncology

## 2019-12-27 ENCOUNTER — Inpatient Hospital Stay: Payer: Self-pay

## 2019-12-27 VITALS — BP 141/91 | HR 79 | Temp 97.8°F | Resp 16 | Wt 167.3 lb

## 2019-12-27 DIAGNOSIS — I5042 Chronic combined systolic (congestive) and diastolic (congestive) heart failure: Secondary | ICD-10-CM | POA: Insufficient documentation

## 2019-12-27 DIAGNOSIS — C642 Malignant neoplasm of left kidney, except renal pelvis: Secondary | ICD-10-CM

## 2019-12-27 DIAGNOSIS — Z79899 Other long term (current) drug therapy: Secondary | ICD-10-CM | POA: Insufficient documentation

## 2019-12-27 DIAGNOSIS — I130 Hypertensive heart and chronic kidney disease with heart failure and stage 1 through stage 4 chronic kidney disease, or unspecified chronic kidney disease: Secondary | ICD-10-CM | POA: Insufficient documentation

## 2019-12-27 DIAGNOSIS — Z8679 Personal history of other diseases of the circulatory system: Secondary | ICD-10-CM

## 2019-12-27 DIAGNOSIS — Z5112 Encounter for antineoplastic immunotherapy: Secondary | ICD-10-CM | POA: Insufficient documentation

## 2019-12-27 DIAGNOSIS — I255 Ischemic cardiomyopathy: Secondary | ICD-10-CM | POA: Insufficient documentation

## 2019-12-27 DIAGNOSIS — Z955 Presence of coronary angioplasty implant and graft: Secondary | ICD-10-CM | POA: Insufficient documentation

## 2019-12-27 DIAGNOSIS — Z5111 Encounter for antineoplastic chemotherapy: Secondary | ICD-10-CM

## 2019-12-27 DIAGNOSIS — Z9861 Coronary angioplasty status: Secondary | ICD-10-CM | POA: Insufficient documentation

## 2019-12-27 DIAGNOSIS — I2510 Atherosclerotic heart disease of native coronary artery without angina pectoris: Secondary | ICD-10-CM | POA: Insufficient documentation

## 2019-12-27 DIAGNOSIS — I10 Essential (primary) hypertension: Secondary | ICD-10-CM

## 2019-12-27 DIAGNOSIS — N1832 Chronic kidney disease, stage 3b: Secondary | ICD-10-CM | POA: Insufficient documentation

## 2019-12-27 DIAGNOSIS — Z7982 Long term (current) use of aspirin: Secondary | ICD-10-CM | POA: Insufficient documentation

## 2019-12-27 DIAGNOSIS — Z905 Acquired absence of kidney: Secondary | ICD-10-CM | POA: Insufficient documentation

## 2019-12-27 LAB — COMPREHENSIVE METABOLIC PANEL WITH GFR
ALT: 19 U/L (ref 0–44)
AST: 23 U/L (ref 15–41)
Albumin: 3.7 g/dL (ref 3.5–5.0)
Alkaline Phosphatase: 98 U/L (ref 38–126)
Anion gap: 10 (ref 5–15)
BUN: 46 mg/dL — ABNORMAL HIGH (ref 6–20)
CO2: 22 mmol/L (ref 22–32)
Calcium: 8.8 mg/dL — ABNORMAL LOW (ref 8.9–10.3)
Chloride: 104 mmol/L (ref 98–111)
Creatinine, Ser: 2.1 mg/dL — ABNORMAL HIGH (ref 0.61–1.24)
GFR, Estimated: 36 mL/min — ABNORMAL LOW (ref >60)
Glucose, Bld: 179 mg/dL — ABNORMAL HIGH (ref 70–99)
Potassium: 4.2 mmol/L (ref 3.5–5.1)
Sodium: 136 mmol/L (ref 135–145)
Total Bilirubin: 0.8 mg/dL (ref 0.3–1.2)
Total Protein: 6.7 g/dL (ref 6.5–8.1)

## 2019-12-27 LAB — CBC WITH DIFFERENTIAL/PLATELET
Abs Immature Granulocytes: 0.03 10*3/uL (ref 0.00–0.07)
Basophils Absolute: 0 10*3/uL (ref 0.0–0.1)
Basophils Relative: 0 %
Eosinophils Absolute: 0.4 10*3/uL (ref 0.0–0.5)
Eosinophils Relative: 4 %
HCT: 42.5 % (ref 39.0–52.0)
Hemoglobin: 13.7 g/dL (ref 13.0–17.0)
Immature Granulocytes: 0 %
Lymphocytes Relative: 24 %
Lymphs Abs: 2 10*3/uL (ref 0.7–4.0)
MCH: 28 pg (ref 26.0–34.0)
MCHC: 32.2 g/dL (ref 30.0–36.0)
MCV: 86.9 fL (ref 80.0–100.0)
Monocytes Absolute: 1 10*3/uL (ref 0.1–1.0)
Monocytes Relative: 11 %
Neutro Abs: 5.1 10*3/uL (ref 1.7–7.7)
Neutrophils Relative %: 61 %
Platelets: 157 10*3/uL (ref 150–400)
RBC: 4.89 MIL/uL (ref 4.22–5.81)
RDW: 13.5 % (ref 11.5–15.5)
WBC: 8.5 10*3/uL (ref 4.0–10.5)
nRBC: 0 % (ref 0.0–0.2)

## 2019-12-27 LAB — T4, FREE: Free T4: 0.79 ng/dL (ref 0.61–1.12)

## 2019-12-27 LAB — TSH: TSH: 4.829 u[IU]/mL — ABNORMAL HIGH (ref 0.350–4.500)

## 2019-12-27 MED ORDER — PEMBROLIZUMAB CHEMO INJECTION 100 MG/4ML
200.0000 mg | Freq: Once | INTRAVENOUS | Status: AC
  Administered 2019-12-27: 200 mg via INTRAVENOUS
  Filled 2019-12-27: qty 8

## 2019-12-27 MED ORDER — AXITINIB 5 MG PO TABS: 5.0000 mg | ORAL_TABLET | Freq: Two times a day (BID) | ORAL | 3 refills | Status: DC

## 2019-12-27 MED ORDER — SODIUM CHLORIDE 0.9 % IV SOLN
Freq: Once | INTRAVENOUS | Status: AC
  Filled 2019-12-27: qty 250

## 2019-12-27 NOTE — Progress Notes (Signed)
Hematology/Oncology Progress Note [facility] [phone] [phone]  Patient Care Team: Earlie Server, MD as PCP - General (Oncology)   Name of the patient: Mark Donaldson  [id number]  [birth year]  Date of visit: 12/27/19   INTERVAL HISTORY-  59 y.o. male presents for follow-up of renal cell carcinoma treatments. Patient has past medical history CAD status post PCI to LAD in 2018, on aspirin and Plavix, CHF, history of focal sclerosing/crescentic GN-ANCA positive and CKD stage III. He was hospitalized from 05/02/2019-05/07/2019 due to gross hematuria. CT showed a 7.4 cm left lower pole renal mass concerning for RCC.  Patient also has necrotic lymphadenopathy in the mediastinum and the right supra hilar region which were highly suspicious for metastatic disease.  Bilateral lung nodules, also concerning for metastatic lung disease.  Right retrocrural lymphadenopathy with upper normal left periaortic lymph node. Patient was recommended for cytoreductive radical nephrectomy.  With history of ANCA positive vasculitis, also recommend thoracic lymphadenopathy biopsy to confirm distal metastasis.  Patient agreed to radical nephrectomy and declined bronchoscopy biopsy. He underwent left radical nephrectomy on 05/05/2019. Pathology showed pT3a pNx, RCC, conventional clear cell type, grade 3 Patient was discharged home. Today he presents to discuss pathology and the management plan. He was accompanied by his wife and daughter. Patient reports that he uses hydrocodone for pain.  Pain around the surgical site has improved.  He occasionally has tingling sensation around the surgical site.  Denies any fever or chills, drainage from the surgical site.  INTERVAL HISTORY Mark Donaldson is a 59 y.o. male who has above history reviewed by me today presents for follow up visit for management of metastatic RCC Problems and complaints are listed  below: Patient is Spanish-speaking.  Spanish interpreter present for the entire encounter for translation Patient reports doing well. He is on axitinib 5 mg twice daily. Denies any new complaints. BP has been stable   Review of systems- Review of Systems  Constitutional: Negative for appetite change, chills, fatigue, fever and unexpected weight change.  HENT:   Negative for hearing loss and voice change.   Eyes: Negative for eye problems and icterus.  Respiratory: Negative for chest tightness and cough.        Occasionally SOB at night  Cardiovascular: Negative for chest pain and leg swelling.  Gastrointestinal: Negative for abdominal distention and abdominal pain.  Endocrine: Negative for hot flashes.  Genitourinary: Negative for difficulty urinating, dysuria and frequency.   Musculoskeletal: Negative for arthralgias.  Skin: Negative for itching and rash.  Neurological: Negative for light-headedness and numbness.  Hematological: Negative for adenopathy. Does not bruise/bleed easily.  Psychiatric/Behavioral: Negative for confusion.    No Known Allergies  Patient Active Problem List   Diagnosis Date Noted  . Stage 3b chronic kidney disease ([place]) 10/04/2019  . Brain lesion 10/04/2019  . Encounter for antineoplastic immunotherapy 07/19/2019  . Encounter for antineoplastic chemotherapy 07/19/2019  . CNS lesion 07/07/2019  . Anemia in stage 3b chronic kidney disease ([place]) 06/07/2019  . Goals of care, counseling/discussion 05/23/2019  . Renal cell carcinoma of left kidney ([place]) 05/23/2019  . Renal cell carcinoma ([place]) 05/23/2019  . Palliative care encounter   . History of vasculitis   . Thoracic lymphadenopathy   . Lung nodule   . CAD S/P percutaneous coronary angioplasty 05/02/2019  . Kidney mass 05/02/2019  . Hydronephrosis of left kidney 05/02/2019  . UTI (urinary tract infection) 05/02/2019  . AKI (acute kidney injury) ([place]) 05/02/2019  .  Gross hematuria 05/02/2019  .  Chronic diastolic CHF (congestive heart failure) ([place]) 05/02/2019  . Sepsis ([place]) 05/02/2019  . Severe sepsis ([place]) 05/02/2019  . Preop cardiovascular exam   . Hyperlipidemia 05/21/2016  . 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 Prediabetes 05/21/2016  . Alcohol abuse 05/18/2016  . Essential hypertension 05/18/2016  . Vasculitis, 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[place]) 05/18/2016  . Shortness of breath 05/17/2016     Past Medical History:  Diagnosis Date  . ANCA-positive vasculitis ([place])   . CAD (coronary artery disease)   . Essential hypertension   . HFrEF (heart failure with reduced ejection fraction) ([place])   . Hyperlipidemia LDL goal <70   . Ischemic cardiomyopathy   . Renal cell carcinoma ([place]) 05/23/2019  . Renal disorder      Past Surgical History:  Procedure Laterality Date  . CARDIAC SURGERY     cardiac cath with 2 stent placement  . LAPAROSCOPIC NEPHRECTOMY, HAND ASSISTED Left 05/05/2019   Procedure: HAND ASSISTED LAPAROSCOPIC NEPHRECTOMY;  Surgeon: Hollice Espy, MD;  Location: [facility] ORS;  Service: Urology;  Laterality: Left;    Social History   Socioeconomic History  . Marital status: Married    Spouse name: Not on file  . Number of children: Not on file  . Years of education: Not on file  . Highest education level: Not on file  Occupational History  . Not on file  Tobacco Use  . Smoking status: Never Smoker  . Smokeless tobacco: Never Used  Substance and Sexual Activity  . Alcohol use: Yes  . Drug use: Not on file  . Sexual activity: Not on file  Other Topics Concern  . Not on file  Social History Narrative  . Not on file   Social Determinants of Health   Financial Resource Strain: Not on file  Food Insecurity: Not on file  Transportation Needs: Not on file  Physical Activity: Not on file  Stress: Not on file  Social Connections: Not on file  Intimate Partner Violence: Not on file     Family History  Problem Relation Age of  Onset  . Cancer Mother   . Blindness Brother      Current Outpatient Medications:  .  aspirin 81 MG chewable tablet, Chew 1 tablet by mouth daily., Disp: , Rfl:  .  atorvastatin (LIPITOR) 80 MG tablet, Take 80 mg by mouth daily., Disp: , Rfl:  .  clopidogrel (PLAVIX) 75 MG tablet, Take 1 tablet by mouth daily., Disp: , Rfl:  .  furosemide (LASIX) 20 MG tablet, 20 mg every other day. , Disp: , Rfl:  .  lisinopril (ZESTRIL) 10 MG tablet, Take 1 tablet by mouth daily., Disp: , Rfl:  .  metoprolol succinate (TOPROL-XL) 100 MG 24 hr tablet, Take 1.5 tablets by mouth daily., Disp: , Rfl:  .  amLODipine (NORVASC) 10 MG tablet, Take 1 tablet (10 mg total) by mouth daily. (Patient not taking: Reported on 12/27/2019), Disp: 90 tablet, Rfl: 1 .  axitinib (INLYTA) 5 MG tablet, Take 1 tablet (5 mg total) by mouth 2 (two) times daily., Disp: 60 tablet, Rfl: 3 .  HYDROcodone-acetaminophen (NORCO/VICODIN) 5-325 MG tablet, Take 1 tablet by mouth every 4 (four) hours as needed for moderate pain. (Patient not taking: No sig reported), Disp: 12 tablet, Rfl: 0 .  melatonin 5 MG TABS, Take 1 tablet (5 mg total) by mouth at bedtime as needed. (Patient not taking: No sig reported), Disp: 30 tablet, Rfl: 0  Physical exam:  Vitals:   12/27/19 0908  BP: (!) 141/91  Pulse: 79  Resp: 16  Temp: 97.8 F (36.6 C)  Weight: 167 lb 4.8 oz (75.9 kg)   Physical Exam Constitutional:      General: He is not in acute distress.    Appearance: He is not diaphoretic.  HENT:     Head: Normocephalic and atraumatic.     Nose: Nose normal.     Mouth/Throat:     Pharynx: No oropharyngeal exudate.  Eyes:     General: No scleral icterus.    Pupils: Pupils are equal, round, and reactive to light.  Cardiovascular:     Rate and Rhythm: Normal rate and regular rhythm.     Heart sounds: No murmur heard.   Pulmonary:     Effort: Pulmonary effort is normal. No respiratory distress.     Breath sounds: No rales.  Chest:      Chest wall: No tenderness.  Abdominal:     General: There is no distension.     Palpations: Abdomen is soft.     Tenderness: There is no abdominal tenderness.  Musculoskeletal:        General: Normal range of motion.     Cervical back: Normal range of motion and neck supple.  Skin:    General: Skin is warm and dry.     Findings: No erythema.  Neurological:     Mental Status: He is alert and oriented to person, place, and time.     Cranial Nerves: No cranial nerve deficit.     Motor: No abnormal muscle tone.     Coordination: Coordination normal.  Psychiatric:        Mood and Affect: Affect normal.        CMP Latest Ref Rng & Units 12/27/2019  Glucose 70 - 99 mg/dL 179(H)  BUN 6 - 20 mg/dL 46(H)  Creatinine 0.61 - 1.24 mg/dL 2.10(H)  Sodium 135 - 145 mmol/L 136  Potassium 3.5 - 5.1 mmol/L 4.2  Chloride 98 - 111 mmol/L 104  CO2 22 - 32 mmol/L 22  Calcium 8.9 - 10.3 mg/dL 8.8(L)  Total Protein 6.5 - 8.1 g/dL 6.7  Total Bilirubin 0.3 - 1.2 mg/dL 0.8  Alkaline Phos 38 - 126 U/L 98  AST 15 - 41 U/L 23  ALT 0 - 44 U/L 19   CBC Latest Ref Rng & Units 12/27/2019  WBC 4.0 - 10.5 K/uL 8.5  Hemoglobin 13.0 - 17.0 g/dL 13.7  Hematocrit 39.0 - 52.0 % 42.5  Platelets 150 - 400 K/uL 157    RADIOGRAPHIC STUDIES: I have personally reviewed the radiological images as listed and agreed with the findings in the report. No results found.  Assessment and plan-  Patient is a 59 y.o. male history of CAD, status post stent, CHF, history of ANCA positive sclerosing/crescentic GN, chronic kidney disease stage III presents for treatments of stage IV renal cell carcinoma 1. Renal cell carcinoma of left kidney (HCC)   2. Encounter for antineoplastic chemotherapy   3. Encounter for antineoplastic immunotherapy   4. Primary hypertension   5. History of CHF (congestive heart failure)    # Stage IV renal cell carcinoma Labs are reviewed and discussed with the patient Proceed with Keytruda  today. Continue axitinib 5 mg twice daily.  # Hypertension.  Continue metoprolol, lisinopril.  Add back Lasix 20mg  every other day.  Blood pressure has been stable.   # Night time SOB,  History of  diastolic CHF, 0/73/7106 LVEF 60 to 65%.  Lasix was previously held due to worsening of kidney function. Continue Lasix every other day.  He is not following cardiology due to lack of insurance.   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 Steva Ready.  Pending work-up, blood test to [facility] at [facility].Per ID, patient has had ophthalmology examination. .  Defer to infectious disease Dr. Steva Ready for management of his neurocysticercosis lesion to ID.  I recently discussed with Dr. Steva Ready and the results were pending.  She will notify patient once the results are back . We spent sufficient time to discuss many aspect of care, questions were answered to patient's satisfaction. Follow-up in 3 weeks for follow-up of evaluation prior to immunotherapy. Thank you for allowing me to participate in the care of this patient.   Earlie Server, MD, PhD Hematology Oncology [facility] Pager- [phone] 12/27/2019

## 2019-12-27 NOTE — Progress Notes (Signed)
Stable at discharge 

## 2019-12-27 NOTE — Progress Notes (Signed)
Patient denies new problems/concerns today.   °

## 2020-01-12 ENCOUNTER — Ambulatory Visit
Admission: RE | Admit: 2020-01-12 | Discharge: 2020-01-12 | Disposition: A | Discharge status: Home / Self Care | Payer: Self-pay | Source: Ambulatory Visit | Attending: Oncology | Admitting: Oncology

## 2020-01-12 DIAGNOSIS — C642 Malignant neoplasm of left kidney, except renal pelvis: Secondary | ICD-10-CM | POA: Insufficient documentation

## 2020-01-16 ENCOUNTER — Telehealth: Payer: Self-pay | Admitting: *Deleted

## 2020-01-16 NOTE — Telephone Encounter (Signed)
Addended Report called  ADDENDUM REPORT: 01/13/2020 11:45  ADDENDUM: There is no sign of aortic atherosclerosis on the current study. There is evidence of coronary artery disease.  Please disregard the RAF code that was previously provided.   Electronically Signed   By: Zetta Bills M.D.   On: 01/13/2020 11:45

## 2020-01-17 ENCOUNTER — Inpatient Hospital Stay: Payer: Self-pay

## 2020-01-17 ENCOUNTER — Inpatient Hospital Stay: Payer: Self-pay | Attending: Oncology

## 2020-01-17 ENCOUNTER — Inpatient Hospital Stay (HOSPITAL_BASED_OUTPATIENT_CLINIC_OR_DEPARTMENT_OTHER): Payer: Self-pay | Admitting: Oncology

## 2020-01-17 ENCOUNTER — Encounter: Payer: Self-pay | Admitting: Oncology

## 2020-01-17 ENCOUNTER — Other Ambulatory Visit: Payer: Self-pay | Admitting: Infectious Diseases

## 2020-01-17 VITALS — BP 167/106 | HR 72 | Temp 96.6°F | Resp 18 | Wt 165.9 lb

## 2020-01-17 DIAGNOSIS — G939 Disorder of brain, unspecified: Secondary | ICD-10-CM

## 2020-01-17 DIAGNOSIS — Z905 Acquired absence of kidney: Secondary | ICD-10-CM | POA: Insufficient documentation

## 2020-01-17 DIAGNOSIS — C642 Malignant neoplasm of left kidney, except renal pelvis: Secondary | ICD-10-CM

## 2020-01-17 DIAGNOSIS — Z5111 Encounter for antineoplastic chemotherapy: Secondary | ICD-10-CM

## 2020-01-17 DIAGNOSIS — I2510 Atherosclerotic heart disease of native coronary artery without angina pectoris: Secondary | ICD-10-CM | POA: Insufficient documentation

## 2020-01-17 DIAGNOSIS — Z79899 Other long term (current) drug therapy: Secondary | ICD-10-CM | POA: Insufficient documentation

## 2020-01-17 DIAGNOSIS — I10 Essential (primary) hypertension: Secondary | ICD-10-CM

## 2020-01-17 DIAGNOSIS — Z5112 Encounter for antineoplastic immunotherapy: Secondary | ICD-10-CM | POA: Insufficient documentation

## 2020-01-17 DIAGNOSIS — I255 Ischemic cardiomyopathy: Secondary | ICD-10-CM | POA: Insufficient documentation

## 2020-01-17 DIAGNOSIS — I130 Hypertensive heart and chronic kidney disease with heart failure and stage 1 through stage 4 chronic kidney disease, or unspecified chronic kidney disease: Secondary | ICD-10-CM | POA: Insufficient documentation

## 2020-01-17 DIAGNOSIS — N1832 Chronic kidney disease, stage 3b: Secondary | ICD-10-CM | POA: Insufficient documentation

## 2020-01-17 DIAGNOSIS — Z7982 Long term (current) use of aspirin: Secondary | ICD-10-CM | POA: Insufficient documentation

## 2020-01-17 DIAGNOSIS — I5042 Chronic combined systolic (congestive) and diastolic (congestive) heart failure: Secondary | ICD-10-CM | POA: Insufficient documentation

## 2020-01-17 DIAGNOSIS — E785 Hyperlipidemia, unspecified: Secondary | ICD-10-CM | POA: Insufficient documentation

## 2020-01-17 DIAGNOSIS — B690 Cysticercosis of central nervous system: Secondary | ICD-10-CM | POA: Insufficient documentation

## 2020-01-17 LAB — COMPREHENSIVE METABOLIC PANEL
ALT: 28 U/L (ref 0–44)
AST: 33 U/L (ref 15–41)
Albumin: 4.1 g/dL (ref 3.5–5.0)
Alkaline Phosphatase: 106 U/L (ref 38–126)
Anion gap: 13 (ref 5–15)
BUN: 39 mg/dL — ABNORMAL HIGH (ref 6–20)
CO2: 18 mmol/L — ABNORMAL LOW (ref 22–32)
Calcium: 9.5 mg/dL (ref 8.9–10.3)
Chloride: 104 mmol/L (ref 98–111)
Creatinine, Ser: 2.04 mg/dL — ABNORMAL HIGH (ref 0.61–1.24)
GFR, Estimated: 37 mL/min — ABNORMAL LOW (ref >60)
Glucose, Bld: 195 mg/dL — ABNORMAL HIGH (ref 70–99)
Potassium: 4.7 mmol/L (ref 3.5–5.1)
Sodium: 135 mmol/L (ref 135–145)
Total Bilirubin: 0.7 mg/dL (ref 0.3–1.2)
Total Protein: 7.2 g/dL (ref 6.5–8.1)

## 2020-01-17 LAB — CBC WITH DIFFERENTIAL/PLATELET
Abs Immature Granulocytes: 0.03 10*3/uL (ref 0.00–0.07)
Basophils Absolute: 0 10*3/uL (ref 0.0–0.1)
Basophils Relative: 0 %
Eosinophils Absolute: 0.4 10*3/uL (ref 0.0–0.5)
Eosinophils Relative: 4 %
HCT: 45.7 % (ref 39.0–52.0)
Hemoglobin: 15.3 g/dL (ref 13.0–17.0)
Immature Granulocytes: 0 %
Lymphocytes Relative: 21 %
Lymphs Abs: 2.1 10*3/uL (ref 0.7–4.0)
MCH: 27.6 pg (ref 26.0–34.0)
MCHC: 33.5 g/dL (ref 30.0–36.0)
MCV: 82.5 fL (ref 80.0–100.0)
Monocytes Absolute: 0.9 10*3/uL (ref 0.1–1.0)
Monocytes Relative: 9 %
Neutro Abs: 6.3 10*3/uL (ref 1.7–7.7)
Neutrophils Relative %: 66 %
Platelets: 166 10*3/uL (ref 150–400)
RBC: 5.54 MIL/uL (ref 4.22–5.81)
RDW: 12.9 % (ref 11.5–15.5)
WBC: 9.6 10*3/uL (ref 4.0–10.5)
nRBC: 0 % (ref 0.0–0.2)

## 2020-01-17 MED ORDER — ALBENDAZOLE 200 MG PO TABS: 600.0000 mg | ORAL_TABLET | Freq: Two times a day (BID) | ORAL | 0 refills | Status: DC

## 2020-01-17 MED ORDER — SODIUM CHLORIDE 0.9 % IV SOLN
Freq: Once | INTRAVENOUS | Status: AC
  Filled 2020-01-17: qty 250

## 2020-01-17 MED ORDER — FUROSEMIDE 20 MG PO TABS: 20.0000 mg | ORAL_TABLET | Freq: Every day | ORAL | 0 refills | Status: DC

## 2020-01-17 MED ORDER — SODIUM CHLORIDE 0.9 % IV SOLN
200.0000 mg | Freq: Once | INTRAVENOUS | Status: AC
  Administered 2020-01-17: 200 mg via INTRAVENOUS
  Filled 2020-01-17: qty 8

## 2020-01-17 NOTE — Progress Notes (Signed)
Hematology/Oncology Progress Note [facility] [phone] [phone]  Patient Care Team: Earlie Server, MD as PCP - General (Oncology)   Name of the patient: Mark Donaldson  [id number]  09/11/1960  Date of visit: 01/17/20   INTERVAL HISTORY-  60 y.o. male presents for follow-up of renal cell carcinoma treatments. Patient has past medical history CAD status post PCI to LAD in 2018, on aspirin and Plavix, CHF, history of focal sclerosing/crescentic GN-ANCA positive and CKD stage III. He was hospitalized from 05/02/2019-05/07/2019 due to gross hematuria. CT showed a 7.4 cm left lower pole renal mass concerning for RCC.  Patient also has necrotic lymphadenopathy in the mediastinum and the right supra hilar region which were highly suspicious for metastatic disease.  Bilateral lung nodules, also concerning for metastatic lung disease.  Right retrocrural lymphadenopathy with upper normal left periaortic lymph node. Patient was recommended for cytoreductive radical nephrectomy.  With history of ANCA positive vasculitis, also recommend thoracic lymphadenopathy biopsy to confirm distal metastasis.  Patient agreed to radical nephrectomy and declined bronchoscopy biopsy. He underwent left radical nephrectomy on 05/05/2019. Pathology showed pT3a pNx, RCC, conventional clear cell type, grade 3 Patient was discharged home. Today he presents to discuss pathology and the management plan. He was accompanied by his wife and daughter. Patient reports that he uses hydrocodone for pain.  Pain around the surgical site has improved.  He occasionally has tingling sensation around the surgical site.  Denies any fever or chills, drainage from the surgical site.  INTERVAL HISTORY Mark Donaldson is a 60 y.o. male who has above history reviewed by me today presents for follow up visit for management of metastatic RCC Problems and complaints are listed  below: Patient is Spanish-speaking.  Spanish interpreter present for the entire encounter for translation Patient reports doing well. He is on axitinib 5 mg twice daily. Denies any new complaints. BP today is 167/106. Lasix was resumed to be every other day since last visit due to his orthopnea symptoms.  He reports symptoms are better, still intermittently having orthopnea episodes.  No leg swelling, chest pain.   Review of systems- Review of Systems  Constitutional: Negative for appetite change, chills, fatigue, fever and unexpected weight change.  HENT:   Negative for hearing loss and voice change.   Eyes: Negative for eye problems and icterus.  Respiratory: Negative for chest tightness and cough.        Occasionally SOB at night  Cardiovascular: Negative for chest pain and leg swelling.  Gastrointestinal: Negative for abdominal distention and abdominal pain.  Endocrine: Negative for hot flashes.  Genitourinary: Negative for difficulty urinating, dysuria and frequency.   Musculoskeletal: Negative for arthralgias.  Skin: Negative for itching and rash.  Neurological: Negative for light-headedness and numbness.  Hematological: Negative for adenopathy. Does not bruise/bleed easily.  Psychiatric/Behavioral: Negative for confusion.    No Known Allergies  Patient Active Problem List   Diagnosis Date Noted  . Stage 3b chronic kidney disease ([place]) 10/04/2019  . Brain lesion 10/04/2019  . Encounter for antineoplastic immunotherapy 07/19/2019  . Encounter for antineoplastic chemotherapy 07/19/2019  . CNS lesion 07/07/2019  . Anemia in stage 3b chronic kidney disease ([place]) 06/07/2019  . Goals of care, counseling/discussion 05/23/2019  . Renal cell carcinoma of left kidney ([place]) 05/23/2019  . Renal cell carcinoma ([place]) 05/23/2019  . Palliative care encounter   . History of vasculitis   . Thoracic lymphadenopathy   . Lung nodule   . CAD S/P  percutaneous coronary angioplasty 05/02/2019   . Kidney mass 05/02/2019  . Hydronephrosis of left kidney 05/02/2019  . UTI (urinary tract infection) 05/02/2019  . AKI (acute kidney injury) ([place]) 05/02/2019  . Gross hematuria 05/02/2019  . Chronic diastolic CHF (congestive heart failure) ([place]) 05/02/2019  . Sepsis ([place]) 05/02/2019  . Severe sepsis ([place]) 05/02/2019  . Preop cardiovascular exam   . Hyperlipidemia 05/21/2016  . 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 Prediabetes 05/21/2016  . Alcohol abuse 05/18/2016  . Essential hypertension 05/18/2016  . Vasculitis, 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[place]) 05/18/2016  . Shortness of breath 05/17/2016     Past Medical History:  Diagnosis Date  . ANCA-positive vasculitis ([place])   . CAD (coronary artery disease)   . Essential hypertension   . HFrEF (heart failure with reduced ejection fraction) ([place])   . Hyperlipidemia LDL goal <70   . Ischemic cardiomyopathy   . Renal cell carcinoma ([place]) 05/23/2019  . Renal disorder      Past Surgical History:  Procedure Laterality Date  . CARDIAC SURGERY     cardiac cath with 2 stent placement  . LAPAROSCOPIC NEPHRECTOMY, HAND ASSISTED Left 05/05/2019   Procedure: HAND ASSISTED LAPAROSCOPIC NEPHRECTOMY;  Surgeon: Hollice Espy, MD;  Location: [facility] ORS;  Service: Urology;  Laterality: Left;    Social History   Socioeconomic History  . Marital status: Married    Spouse name: Not on file  . Number of children: Not on file  . Years of education: Not on file  . Highest education level: Not on file  Occupational History  . Not on file  Tobacco Use  . Smoking status: Never Smoker  . Smokeless tobacco: Never Used  Substance and Sexual Activity  . Alcohol use: Yes  . Drug use: Not on file  . Sexual activity: Not on file  Other Topics Concern  . Not on file  Social History Narrative  . Not on file   Social Determinants of Health   Financial Resource Strain: Not on file  Food Insecurity: Not on file  Transportation  Needs: Not on file  Physical Activity: Not on file  Stress: Not on file  Social Connections: Not on file  Intimate Partner Violence: Not on file     Family History  Problem Relation Age of Onset  . Cancer Mother   . Blindness Brother      Current Outpatient Medications:  .  amLODipine (NORVASC) 10 MG tablet, Take 1 tablet (10 mg total) by mouth daily. (Patient not taking: Reported on 12/27/2019), Disp: 90 tablet, Rfl: 1 .  aspirin 81 MG chewable tablet, Chew 1 tablet by mouth daily., Disp: , Rfl:  .  atorvastatin (LIPITOR) 80 MG tablet, Take 80 mg by mouth daily., Disp: , Rfl:  .  axitinib (INLYTA) 5 MG tablet, Take 1 tablet (5 mg total) by mouth 2 (two) times daily., Disp: 60 tablet, Rfl: 3 .  clopidogrel (PLAVIX) 75 MG tablet, Take 1 tablet by mouth daily., Disp: , Rfl:  .  furosemide (LASIX) 20 MG tablet, 20 mg every other day. , Disp: , Rfl:  .  HYDROcodone-acetaminophen (NORCO/VICODIN) 5-325 MG tablet, Take 1 tablet by mouth every 4 (four) hours as needed for moderate pain. (Patient not taking: No sig reported), Disp: 12 tablet, Rfl: 0 .  lisinopril (ZESTRIL) 10 MG tablet, Take 1 tablet by mouth daily., Disp: , Rfl:  .  melatonin 5 MG TABS, Take 1 tablet (5 mg total) by mouth at bedtime  as needed. (Patient not taking: No sig reported), Disp: 30 tablet, Rfl: 0 .  metoprolol succinate (TOPROL-XL) 100 MG 24 hr tablet, Take 1.5 tablets by mouth daily., Disp: , Rfl:    Physical exam:  Vitals:   01/17/20 0905  BP: (!) 167/106  Pulse: 72  Resp: 18  Temp: (!) 96.6 F (35.9 C)  Weight: 165 lb 14.4 oz (75.3 kg)   Physical Exam Constitutional:      General: He is not in acute distress.    Appearance: He is not diaphoretic.  HENT:     Head: Normocephalic and atraumatic.     Nose: Nose normal.     Mouth/Throat:     Pharynx: No oropharyngeal exudate.  Eyes:     General: No scleral icterus.    Pupils: Pupils are equal, round, and reactive to light.  Cardiovascular:     Rate  and Rhythm: Normal rate and regular rhythm.     Heart sounds: No murmur heard.   Pulmonary:     Effort: Pulmonary effort is normal. No respiratory distress.     Breath sounds: No rales.  Chest:     Chest wall: No tenderness.  Abdominal:     General: There is no distension.     Palpations: Abdomen is soft.     Tenderness: There is no abdominal tenderness.  Musculoskeletal:        General: Normal range of motion.     Cervical back: Normal range of motion and neck supple.  Skin:    General: Skin is warm and dry.     Findings: No erythema.  Neurological:     Mental Status: He is alert and oriented to person, place, and time.     Cranial Nerves: No cranial nerve deficit.     Motor: No abnormal muscle tone.     Coordination: Coordination normal.  Psychiatric:        Mood and Affect: Affect normal.        CMP Latest Ref Rng & Units 01/17/2020  Glucose 70 - 99 mg/dL 195(H)  BUN 6 - 20 mg/dL 39(H)  Creatinine 0.61 - 1.24 mg/dL 2.04(H)  Sodium 135 - 145 mmol/L 135  Potassium 3.5 - 5.1 mmol/L 4.7  Chloride 98 - 111 mmol/L 104  CO2 22 - 32 mmol/L 18(L)  Calcium 8.9 - 10.3 mg/dL 9.5  Total Protein 6.5 - 8.1 g/dL 7.2  Total Bilirubin 0.3 - 1.2 mg/dL 0.7  Alkaline Phos 38 - 126 U/L 106  AST 15 - 41 U/L 33  ALT 0 - 44 U/L 28   CBC Latest Ref Rng & Units 01/17/2020  WBC 4.0 - 10.5 K/uL 9.6  Hemoglobin 13.0 - 17.0 g/dL 15.3  Hematocrit 39.0 - 52.0 % 45.7  Platelets 150 - 400 K/uL 166    RADIOGRAPHIC STUDIES: I have personally reviewed the radiological images as listed and agreed with the findings in the report. CT CHEST ABDOMEN PELVIS WO CONTRAST  Addendum Date: 01/13/2020   ADDENDUM REPORT: 01/13/2020 11:45 ADDENDUM: There is no sign of aortic atherosclerosis on the current study. There is evidence of coronary artery disease. Please disregard the RAF code that was previously provided. Electronically Signed   By: Zetta Bills M.D.   On: 01/13/2020 11:45   Result Date:  01/13/2020 CLINICAL DATA:  History of renal cell carcinoma post LEFT nephrectomy in April of 2021. Ongoing therapy by report. EXAM: CT CHEST, ABDOMEN AND PELVIS WITHOUT CONTRAST TECHNIQUE: Multidetector CT imaging of the chest, abdomen  and pelvis was performed following the standard protocol without IV contrast. COMPARISON:  CT evaluation from May 03, 2019. FINDINGS: CT CHEST FINDINGS Cardiovascular: Noncontrast appearance of the heart great vessels is remarkable only for coronary artery calcification of LEFT coronary circulation and signs of prior percutaneous coronary intervention. Small calcification also in RIGHT coronary circulation along the RIGHT heart border. No pericardial effusion. Normal caliber thoracic aorta. Normal caliber central pulmonary vessels. Limited assessment of cardiovascular structures given lack of intravenous contrast. A Mediastinum/Nodes: Thoracic inlet structures are normal. No axillary lymphadenopathy. No mediastinal lymphadenopathy. No gross hilar adenopathy. Enhancing paramediastinal soft tissue nodule seen on the previous exam have nearly completely resolved. For instance, a small soft tissue nodule along the RIGHT anterior mediastinal margin (image 19, series 2) 4-5 mm previously in the range of 15 mm (on the April evaluation. As compared to the PET exam this area previously measured approximately 13 mm. Other areas which were larger than this focus on the prior study of April of 2021 are no longer evident. Lungs/Pleura: Nodule along the minor fissure in the RIGHT chest (image 69, series 5) 8 mm, previous 18 mm on the April study and 11 mm on the most recent comparison imaging, PET evaluation from August of 2021. Resolution of pleural fluid and thickening in the RIGHT chest and small pleural effusion in the LEFT chest since the prior study. No new pulmonary nodule. Nodules deep within the pleural space along the RIGHT hemidiaphragm and retrocrural implants seen on the previous  study from April have resolved as on the previous PET evaluation. Musculoskeletal: See below for full musculoskeletal details. CT ABDOMEN PELVIS FINDINGS Hepatobiliary: Normal hepatic contour. No focal lesion on noncontrast imaging. No pericholecystic stranding. Pancreas: Normal contour no sign of inflammation. Spleen: Normal, posteriorly oriented following LEFT nephrectomy. Adrenals/Urinary Tract: Adrenal glands are normal. No RIGHT-sided hydronephrosis or nephrolithiasis. Post LEFT nephrectomy as before. Suture line along the LEFT renal vasculature shows a similar appearance and shows no sign of increasing nodularity or soft tissue thickening. Very subtle variation in LEFT psoas musculature while nonspecific and potentially artifact related, also suggested however on coronal images, very subtle change in muscular structure and asymmetry and may measure as much as 2 x 1.5 cm. Stomach/Bowel: Scattered colonic diverticulosis.  Normal appendix. Stomach distended with ingested contrast material. No small bowel dilation or signs of small bowel inflammation. Vascular/Lymphatic: Normal caliber abdominal aorta. There is no gastrohepatic or hepatoduodenal ligament lymphadenopathy. No retroperitoneal or mesenteric lymphadenopathy. The No pelvic sidewall lymphadenopathy. Reproductive: Prostate mildly enlarged. Other: Subtle nodularity in the soft tissues of the LEFT flank (image 76 of series 2 measuring approximately 7 mm. No ascites. No peritoneal nodularity. Musculoskeletal: Spinal degenerative changes. No acute or destructive bone process. 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 IMPRESSION: 1. Overall marked improvement with respect to disease in the chest, abdomen and pelvis when compared to the study of April 2021 and continued improvement when compared to the exam from August of 2021. 2. Very subtle variation in muscular architecture of the LEFT psoas muscle is of uncertain  significance and potentially artifact related. However, given the potential for intramuscular metastatic lesions in the setting of renal cell carcinoma with consider short interval follow-up assessment with MRI. Subsequent follow-up imaging could also be performed with MRI of the abdomen for follow-up in this high-risk patient with additional coverage provided with noncontrast CT as warranted. 3. Subtle nodularity in the soft tissues of the LEFT flank measuring approximately  7 mm. This could represent an injection site or area of contusion with given focal appearance would suggest attention on subsequent imaging. 4. 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 5. Post LEFT nephrectomy. 6. Coronary artery calcification and signs of prior percutaneous coronary intervention. 7. Aortic atherosclerosis. Aortic Atherosclerosis (ICD10-I70.0). Electronically Signed: By: Zetta Bills M.D. On: 01/12/2020 11:51    Assessment and plan-  Patient is a 60 y.o. male history of CAD, status post stent, CHF, history of ANCA positive sclerosing/crescentic GN, chronic kidney disease stage III presents for treatments of stage IV renal cell carcinoma 1. Renal cell carcinoma of left kidney (HCC)   2. Encounter for antineoplastic chemotherapy   3. Encounter for antineoplastic immunotherapy   4. Primary hypertension   5. Brain lesion    # Stage IV renal cell carcinoma Labs are reviewed and discussed with the patient  Proceed with Keytruda today Continue axitinib twice daily Interval CT chest abdomen pelvis was independently reviewed by me and discussed with patient. 01/12/2020, CT showed overall marked improvement.  Comparing to CT scan in August 2021, continues to have treatment response.Mediastinal soft tissue nodule nearly completely resolved, right minor fissure nodule 8 mm, decreased in size.  Resolution of pleural fluid and thickening of the right chest.  Small pleural effusion  in the left chest.  No new lung nodules.-Subtle variation of the muscle architecture of the left psoas muscle of unknown significance.-Continue observation.  He is asymptomatic. Subtle nodule in the soft tissue of the left flank.  #Hypertension, CHF Patient is not currently following up with primary care physician or cardiology.  I strongly recommend him to make an appointment follow-up with PCP.  He has no insurance coverage so currently not able to see cardiology Continue metoprolol, lisinopril.  Increase Lasix 20 mg every other day to daily. I recommend him to monitor blood pressure at home.  If still uncontrolled by the next visit, plan to add amlodipine.   # Night time SOB,  History of diastolic CHF, 07/13/1599 LVEF 60 to 65%.  See above.  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  Pending work-up, blood test to be done at [facility].Per ID, patient has had ophthalmology examination. .  Defer to infectious disease Dr. Steva Ready for management of his neurocysticercosis lesion to ID.  I recently discussed with Dr. Steva Ready who plans to start treatment.  Currently Dr. Steva Ready is looking into medication coverage for him  . We spent sufficient time to discuss many aspect of care, questions were answered to patient's satisfaction. Follow-up in 3 weeks for follow-up of evaluation prior to immunotherapy. Thank you for allowing me to participate in the care of this patient.   Earlie Server, MD, PhD Hematology Oncology [facility] at [facility] Pager- [phone] 01/17/2020

## 2020-01-17 NOTE — Progress Notes (Signed)
Pt here for follow up. No new concerns voiced.   

## 2020-01-17 NOTE — Progress Notes (Unsigned)
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

## 2020-01-18 ENCOUNTER — Other Ambulatory Visit: Payer: Self-pay | Admitting: Infectious Diseases

## 2020-01-18 MED ORDER — PREDNISONE 5 MG PO TABS: 5.0000 mg | ORAL_TABLET | Freq: Every day | ORAL | 0 refills | Status: DC

## 2020-01-18 MED ORDER — PREDNISONE 20 MG PO TABS: 20.0000 mg | ORAL_TABLET | Freq: Every day | ORAL | 0 refills | Status: DC

## 2020-01-18 MED ORDER — PREDNISONE 1 MG PO TABS: 1.0000 mg | ORAL_TABLET | Freq: Every day | ORAL | 0 refills | Status: DC

## 2020-01-18 MED ORDER — PREDNISONE 10 MG PO TABS: 10.0000 mg | ORAL_TABLET | Freq: Every day | ORAL | 0 refills | Status: DC

## 2020-01-18 MED ORDER — PREDNISONE 2.5 MG PO TABS: 2.5000 mg | ORAL_TABLET | Freq: Every day | ORAL | 0 refills | Status: DC

## 2020-01-18 MED ORDER — PREDNISONE 50 MG PO TABS: ORAL_TABLET | ORAL | 0 refills | Status: DC

## 2020-01-18 MED FILL — ALBENZA 200 MG TABS: 200 | 28 days supply | Qty: 84 | Fill #0

## 2020-01-18 NOTE — Progress Notes (Signed)
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

## 2020-01-19 ENCOUNTER — Other Ambulatory Visit: Payer: Self-pay

## 2020-01-19 ENCOUNTER — Ambulatory Visit: Payer: Self-pay | Attending: Infectious Diseases | Admitting: Infectious Diseases

## 2020-01-19 DIAGNOSIS — B690 Cysticercosis of central nervous system: Secondary | ICD-10-CM | POA: Insufficient documentation

## 2020-01-19 DIAGNOSIS — Z79899 Other long term (current) drug therapy: Secondary | ICD-10-CM | POA: Insufficient documentation

## 2020-01-19 MED ORDER — PRAZIQUANTEL 600 MG PO TABS: 600.0000 mg | ORAL_TABLET | Freq: Three times a day (TID) | ORAL | 0 refills | Status: DC

## 2020-01-19 NOTE — Patient Instructions (Signed)
You are here to start treatment for neurocysticercosis- we have given you writtent instructions in spanish- will see you next Thursday to se ehow you are taking the meds and any sid eeffects

## 2020-01-19 NOTE — Progress Notes (Signed)
NAME: Mark Donaldson  DOB: 11/17/1960  MRN: [id number]  Date/Time: 01/19/2020 3:19 PM  Subjective:   ?Patient is here for follow-up visit for neurocysticercosis. I first saw him June 2021.  Patient speaks Spanish and hospital interpreter is in the room. Isrrael Fluckiger is a 60 y.o. 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 As part of work-up for renal carcinoma he underwent an MRI of his brain and that showed cystic lesions and calcified lesions. I have seen him on 07/07/2019.  I sent his lab work for tinea Solium immunoblot to [facility] and also sent some other labs.  Patient is currently on axitinib for renal carcinoma.  He is also on metoprolol, atorvastatin, aspirin, Plavix, lisinopril, amlodipine and furosemide. He lives with his wife and son. He says he never went back to [place] since he moved in 1995. 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 He has family visiting him from [place] but they do not cook for him. He is not had any headache or seizures He has blurring of the right eye for a few years and saw ophthalmologist Dr. Edison Pace in June and there was no evidence of cysticercosis in the eyes.  There was a old scar.  His vision in the right eye is 21 out of 200.  Past Medical History:  Diagnosis Date  . 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 CAD (coronary artery disease)   . Essential hypertension   . HFrEF (heart failure with reduced ejection fraction) ([place])   . Hyperlipidemia LDL goal <70   . Ischemic cardiomyopathy   . Renal cell carcinoma ([place]) 05/23/2019  . Renal disorder     Past Surgical History:  Procedure Laterality Date  . CARDIAC SURGERY     cardiac cath with 2 stent placement  .  LAPAROSCOPIC NEPHRECTOMY, HAND ASSISTED Left 05/05/2019   Procedure: HAND ASSISTED LAPAROSCOPIC NEPHRECTOMY;  Surgeon: Hollice Espy, MD;  Location: [facility] ORS;  Service: Urology;  Laterality: Left;    Social History   Socioeconomic History  . Marital status: Married    Spouse name: Not on file  . Number of children: Not on file  . Years of education: Not on file  . Highest education level: Not on file  Occupational History  . Not on file  Tobacco Use  . Smoking status: Never Smoker  . Smokeless tobacco: Never Used  Substance and Sexual Activity  . Alcohol use: Yes  . Drug use: Not on file  . Sexual activity: Not on file  Other Topics Concern  . Not on file  Social History Narrative  . Not on file   Social Determinants of Health   Financial Resource Strain: Not on file  Food Insecurity: Not on file  Transportation Needs: Not on file  Physical Activity: Not on file  Stress: Not on file  Social Connections: Not on file  Intimate Partner Violence: Not on file    Family History  Problem Relation Age of Onset  . Cancer Mother   . Blindness Brother    No Known Allergies  ? Current Outpatient Medications  Medication Sig Dispense Refill  . albendazole (ALBENZA) 200 MG tablet Take 3 tablets (600 mg total) by mouth in the morning and at bedtime. 84 tablet 0  .  amLODipine (NORVASC) 10 MG tablet Take 1 tablet (10 mg total) by mouth daily. (Patient not taking: No sig reported) 90 tablet 1  . aspirin 81 MG chewable tablet Chew 1 tablet by mouth daily.    Marland Kitchen atorvastatin (LIPITOR) 80 MG tablet Take 80 mg by mouth daily.    Marland Kitchen axitinib (INLYTA) 5 MG tablet Take 1 tablet (5 mg total) by mouth 2 (two) times daily. 60 tablet 3  . clopidogrel (PLAVIX) 75 MG tablet Take 1 tablet by mouth daily.    . furosemide (LASIX) 20 MG tablet Take 1 tablet (20 mg total) by mouth daily. 30 tablet 0  . HYDROcodone-acetaminophen (NORCO/VICODIN) 5-325 MG tablet Take 1 tablet by mouth every 4 (four) hours  as needed for moderate pain. (Patient not taking: No sig reported) 12 tablet 0  . lisinopril (ZESTRIL) 10 MG tablet Take 1 tablet by mouth daily. (Patient not taking: Reported on 01/17/2020)    . melatonin 5 MG TABS Take 1 tablet (5 mg total) by mouth at bedtime as needed. (Patient not taking: No sig reported) 30 tablet 0  . metoprolol succinate (TOPROL-XL) 100 MG 24 hr tablet Take 1.5 tablets by mouth daily.    . predniSONE (DELTASONE) 1 MG tablet Take 1 tablet (1 mg total) by mouth daily with breakfast. 5 tablet 0  . predniSONE (DELTASONE) 10 MG tablet Take 1 tablet (10 mg total) by mouth daily with breakfast. 22 tablet 0  . predniSONE (DELTASONE) 2.5 MG tablet Take 1 tablet (2.5 mg total) by mouth daily with breakfast. 28 tablet 0  . predniSONE (DELTASONE) 20 MG tablet Take 1 tablet (20 mg total) by mouth daily with breakfast. 35 tablet 0  . predniSONE (DELTASONE) 5 MG tablet Take 1 tablet (5 mg total) by mouth daily with breakfast. 28 tablet 0  . predniSONE (DELTASONE) 50 MG tablet This is a 60 day taper for treatment of neurocysticercosis- 18 tablet 0   No current facility-administered medications for this visit.     Abtx:  Anti-infectives (From admission, onward)   None      REVIEW OF SYSTEMS:  Const: negative fever, negative chills, negative weight loss Eyes: As above ENT: negative coryza, negative sore throat Resp: negative cough, hemoptysis, dyspnea Cards: negative for chest pain, palpitations, lower extremity edema GU: negative for frequency, dysuria and hematuria GI: Negative for abdominal pain, diarrhea, bleeding, constipation Skin: negative for rash and pruritus Heme: negative for easy bruising and gum/nose bleeding MS: negative for myalgias, arthralgias, back pain and muscle weakness Neurolo:negative for headaches, dizziness, vertigo, memory problems  Psych: negative for feelings of anxiety, depression  Endocrine: negative for thyroid, diabetes Allergy/Immunology-  negative for any medication or food allergies ? : Objective:  VITALS:  PHYSICAL EXAM:  General: Alert, cooperative, no distress, appears stated age.  Eye- no double vision Lungs: Clear to auscultation bilaterally. No Wheezing or Rhonchi. No rales. Heart: Regular rate and rhythm, no murmur, rub or gallop. Neurologic: Grossly non-focal Pertinent Labs Lab Results CBC    Component Value Date/Time   WBC 9.6 01/17/2020 0840   RBC 5.54 01/17/2020 0840   HGB 15.3 01/17/2020 0840   HCT 45.7 01/17/2020 0840   PLT 166 01/17/2020 0840   MCV 82.5 01/17/2020 0840   MCH 27.6 01/17/2020 0840   MCHC 33.5 01/17/2020 0840   RDW 12.9 01/17/2020 0840   LYMPHSABS 2.1 01/17/2020 0840   MONOABS 0.9 01/17/2020 0840   EOSABS 0.4 01/17/2020 0840   BASOSABS 0.0 01/17/2020 0840  CMP Latest Ref Rng & Units 01/17/2020 12/27/2019 12/06/2019  Glucose 70 - 99 mg/dL 195(H) 179(H) 179(H)  BUN 6 - 20 mg/dL 39(H) 46(H) 42(H)  Creatinine 0.61 - 1.24 mg/dL 2.04(H) 2.10(H) 1.97(H)  Sodium 135 - 145 mmol/L 135 136 138  Potassium 3.5 - 5.1 mmol/L 4.7 4.2 4.3  Chloride 98 - 111 mmol/L 104 104 104  CO2 22 - 32 mmol/L 18(L) 22 23  Calcium 8.9 - 10.3 mg/dL 9.5 8.8(L) 9.3  Total Protein 6.5 - 8.1 g/dL 7.2 6.7 7.0  Total Bilirubin 0.3 - 1.2 mg/dL 0.7 0.8 0.6  Alkaline Phos 38 - 126 U/L 106 98 99  AST 15 - 41 U/L 33 23 21  ALT 0 - 44 U/L 28 19 20     IMAGING RESULTS:     I have personally reviewed the films Peripherally enhancing 5 mm nodule along the lower left basal ganglia. 2. Bilobed cystic lesion with enhancing mural nodule in the low right parietal lobe measuring 21 x 8 mm on 18:87. 3. Cyst and mural nodule lesion in the posterior right frontal lobe measuring up to 2 cm on 18:103. 4. 5 mm nodule along the cortex of the high left frontal lobe on 18:110 ? Impression/Recommendation ? ?Neurocysticercosis of the brain.  Patient has 4 lesions in the MRI of the brain 2 of them are cystic lesion with  scolex seen ?They are in the right parietal lobe The other 2 lesions on the left side are calcified.  The patient has viable parenchymal cyst with no surrounding edema or hydrocephalus.  This case was discussed with CDC's tropical medicine expert.  The lab work was done at [facility].  Tinea Solium IgG immunoblot was positive Strongyloidiasis antibody was tested and was negative QuantiFERON gold was tested and it was negative.  He is asymptomatic.  But because of his immunocompromised state and presence of 2 cyst we will treat him with 2 weeks of albendazole plus praziquantel we will do a prednisone taper We  got albendazole 600 mg p.o. twice daily for 2 weeks plus praziquantel 600 mg orally 3 times a day for 2 weeks.  The prednisone dose is 60 mg starting a day before the anti-helminthic rx  and tapering it gradually by 2.5 mg every 3 days.   Patient was educated in great detail by antimicrobial stewardship pharmacist and myself along with the interpreter. We gave him prefilled pillbox for the entire course of prednisone. Albendazole was given to him with instructions in Spanish. Praziquantel medication will be procured tomorrow and the company is delivering it to the hospital.  We will give them a call to pick it up from [place].  Patient related back to the  protocol we explained to him.  Discussed the importance of taking the medications every day and also not missing steroid dose because of risk for inflammatory response leading to seizures.  He will see me in 1 week.  Spent nearly an hour with the patient of which the entire time was spent on counseling and education.   CKD Renal carcinoma status post left nephrectomy on axitinib Hypertension on metoprolol, amlodipine, furosemide CAD on Plavix ___________________________________________________  Spent nearly an hour with the patient of which the entire time was spent on counseling and education. Note:  This document was prepared using Dragon voice  recognition software and may include unintentional dictation errors.

## 2020-01-26 ENCOUNTER — Encounter: Payer: Self-pay | Admitting: Infectious Diseases

## 2020-01-26 ENCOUNTER — Other Ambulatory Visit: Payer: Self-pay

## 2020-01-26 ENCOUNTER — Ambulatory Visit: Payer: Self-pay | Attending: Infectious Diseases | Admitting: Infectious Diseases

## 2020-01-26 VITALS — BP 153/102 | HR 76 | Temp 98.1°F | Resp 16 | Ht 63.0 in | Wt 161.4 lb

## 2020-01-26 DIAGNOSIS — B690 Cysticercosis of central nervous system: Secondary | ICD-10-CM | POA: Insufficient documentation

## 2020-01-26 NOTE — Patient Instructions (Signed)
      You are here for treatment of Neurocysticercosis- you will finish albendazole and praziquantel next Friday- 02/03/20 You will continue with tapering steroids You will get blood work on 02/05/20 Will see you in 1 month

## 2020-01-26 NOTE — Progress Notes (Signed)
Id Here for follow up after starting treatment for neurocysticercosis on 01/20/20 Hospital spanish interpreter in the room Pt brought all the medications with him to show how he is taking them He takes albendazole 600mg  PO BID ( 3 pills Bid) , praziquantel 600mg  three times a day and on tapering steroids. Has some nausea Some dizziness No seizures Some headache But all the symptoms bearable Pt has not taken BP meds this morning  BP (!) 153/102   Pulse 76   Temp 98.1 F (36.7 C)   Resp 16   Ht 5\' 3"  (1.6 m)   Wt 161 lb 6.4 oz (73.2 kg)   SpO2 96%   BMI 28.59 kg/m    No distress PERL Throa NAD Chest CTA Hss1s2 Abd soft Cns non focal   Impression/recommendation Neurocysticercosis - 2 viable cysts and 2 calcified lesions. On Dual therapy for 14 days and a long steroid taper. Will get cbc/cmp after he completes the dual therapy Follow up 1 month Discussed the management with patient and his wife- showed them pictures of the MRI

## 2020-02-07 ENCOUNTER — Inpatient Hospital Stay: Payer: Self-pay

## 2020-02-07 ENCOUNTER — Inpatient Hospital Stay (HOSPITAL_BASED_OUTPATIENT_CLINIC_OR_DEPARTMENT_OTHER): Payer: Self-pay | Admitting: Oncology

## 2020-02-07 ENCOUNTER — Encounter: Payer: Self-pay | Admitting: Oncology

## 2020-02-07 VITALS — BP 132/99 | HR 90 | Temp 97.8°F | Resp 18 | Wt 157.7 lb

## 2020-02-07 DIAGNOSIS — C642 Malignant neoplasm of left kidney, except renal pelvis: Secondary | ICD-10-CM

## 2020-02-07 DIAGNOSIS — Z5112 Encounter for antineoplastic immunotherapy: Secondary | ICD-10-CM

## 2020-02-07 DIAGNOSIS — Z5111 Encounter for antineoplastic chemotherapy: Secondary | ICD-10-CM

## 2020-02-07 DIAGNOSIS — Z8679 Personal history of other diseases of the circulatory system: Secondary | ICD-10-CM

## 2020-02-07 DIAGNOSIS — N1832 Chronic kidney disease, stage 3b: Secondary | ICD-10-CM

## 2020-02-07 LAB — COMPREHENSIVE METABOLIC PANEL
ALT: 30 U/L (ref 0–44)
AST: 33 U/L (ref 15–41)
Albumin: 3.3 g/dL — ABNORMAL LOW (ref 3.5–5.0)
Alkaline Phosphatase: 101 U/L (ref 38–126)
Anion gap: 10 (ref 5–15)
BUN: 44 mg/dL — ABNORMAL HIGH (ref 6–20)
CO2: 22 mmol/L (ref 22–32)
Calcium: 8.7 mg/dL — ABNORMAL LOW (ref 8.9–10.3)
Chloride: 102 mmol/L (ref 98–111)
Creatinine, Ser: 1.71 mg/dL — ABNORMAL HIGH (ref 0.61–1.24)
GFR, Estimated: 46 mL/min — ABNORMAL LOW (ref >60)
Glucose, Bld: 193 mg/dL — ABNORMAL HIGH (ref 70–99)
Potassium: 4.5 mmol/L (ref 3.5–5.1)
Sodium: 134 mmol/L — ABNORMAL LOW (ref 135–145)
Total Bilirubin: 0.5 mg/dL (ref 0.3–1.2)
Total Protein: 6.5 g/dL (ref 6.5–8.1)

## 2020-02-07 LAB — TSH: TSH: 1.423 u[IU]/mL (ref 0.350–4.500)

## 2020-02-07 LAB — CBC WITH DIFFERENTIAL/PLATELET
Abs Immature Granulocytes: 0.01 10*3/uL (ref 0.00–0.07)
Basophils Absolute: 0 10*3/uL (ref 0.0–0.1)
Basophils Relative: 0 %
Eosinophils Absolute: 0 10*3/uL (ref 0.0–0.5)
Eosinophils Relative: 0 %
HCT: 43.7 % (ref 39.0–52.0)
Hemoglobin: 14.5 g/dL (ref 13.0–17.0)
Immature Granulocytes: 0 %
Lymphocytes Relative: 16 %
Lymphs Abs: 0.8 10*3/uL (ref 0.7–4.0)
MCH: 27.1 pg (ref 26.0–34.0)
MCHC: 33.2 g/dL (ref 30.0–36.0)
MCV: 81.7 fL (ref 80.0–100.0)
Monocytes Absolute: 0.2 10*3/uL (ref 0.1–1.0)
Monocytes Relative: 3 %
Neutro Abs: 4 10*3/uL (ref 1.7–7.7)
Neutrophils Relative %: 81 %
Platelets: 173 10*3/uL (ref 150–400)
RBC: 5.35 MIL/uL (ref 4.22–5.81)
RDW: 13.8 % (ref 11.5–15.5)
WBC: 5 10*3/uL (ref 4.0–10.5)
nRBC: 0 % (ref 0.0–0.2)

## 2020-02-07 MED ORDER — SODIUM CHLORIDE 0.9 % IV SOLN
200.0000 mg | Freq: Once | INTRAVENOUS | Status: AC
  Administered 2020-02-07: 200 mg via INTRAVENOUS
  Filled 2020-02-07: qty 8

## 2020-02-07 MED ORDER — FUROSEMIDE 20 MG PO TABS: 20.0000 mg | ORAL_TABLET | Freq: Every day | ORAL | 3 refills | Status: AC

## 2020-02-07 MED ORDER — SODIUM CHLORIDE 0.9 % IV SOLN
Freq: Once | INTRAVENOUS | Status: AC
  Filled 2020-02-07: qty 250

## 2020-02-07 NOTE — Progress Notes (Signed)
Hematology/Oncology Progress Note [facility] [phone] [phone]  Patient Care Team: [facility], [facility] as PCP - General (General Practice)   Name of the patient: Mark Donaldson  [id number]  [birth year]  Date of visit: 02/07/20   INTERVAL HISTORY-  60 y.o. male presents for follow-up of renal cell carcinoma treatments. Patient has past medical history CAD status post PCI to LAD in 2018, on aspirin and Plavix, CHF, history of focal sclerosing/crescentic GN-ANCA positive and CKD stage III. He was hospitalized from 05/02/2019-05/07/2019 due to gross hematuria. CT showed a 7.4 cm left lower pole renal mass concerning for RCC.  Patient also has necrotic lymphadenopathy in the mediastinum and the right supra hilar region which were highly suspicious for metastatic disease.  Bilateral lung nodules, also concerning for metastatic lung disease.  Right retrocrural lymphadenopathy with upper normal left periaortic lymph node. Patient was recommended for cytoreductive radical nephrectomy.  With history of ANCA positive vasculitis, also recommend thoracic lymphadenopathy biopsy to confirm distal metastasis.  Patient agreed to radical nephrectomy and declined bronchoscopy biopsy. He underwent left radical nephrectomy on 05/05/2019. Pathology showed pT3a pNx, RCC, conventional clear cell type, grade 3 Patient was discharged home. Today he presents to discuss pathology and the management plan. He was accompanied by his wife and daughter. Patient reports that he uses hydrocodone for pain.  Pain around the surgical site has improved.  He occasionally has tingling sensation around the surgical site.  Denies any fever or chills, drainage from the surgical site.  # Interval CT chest abdomen pelvis was independently reviewed by me and discussed with patient. 01/12/2020, CT showed overall marked improvement.  Comparing to CT scan in August 2021,  continues to have treatment response.Mediastinal soft tissue nodule nearly completely resolved, right minor fissure nodule 8 mm, decreased in size.  Resolution of pleural fluid and thickening of the right chest.  Small pleural effusion in the left chest.  No new lung nodules.-Subtle variation of the muscle architecture of the left psoas muscle of unknown significance.-Continue observation.  He is asymptomatic. Subtle nodule in the soft tissue of the left flank.   INTERVAL HISTORY Mark Donaldson is a 60 y.o. male who has above history reviewed by me today presents for follow up visit for management of metastatic RCC Problems and complaints are listed below: Patient is Spanish-speaking.  Spanish interpreter present for the entire encounter for translation Patient reports doing well.  He was accompanied by his son today He takes axitinib 5 mg twice daily. He was recently started on 14 days course of albendazole, plus praziquantel, and a tapering course of prednisone For treatment of neurocysticercosis.  Managed by infectious disease physician Dr. Steva Ready Patient reports overall tolerating.  intermittently having orthopnea episodes.  No leg swelling, chest pain.   Review of systems- Review of Systems  Constitutional: Negative for appetite change, chills, fatigue, fever and unexpected weight change.  HENT:   Negative for hearing loss and voice change.   Eyes: Negative for eye problems and icterus.  Respiratory: Negative for chest tightness and cough.        Occasionally SOB at night  Cardiovascular: Negative for chest pain and leg swelling.  Gastrointestinal: Negative for abdominal distention and abdominal pain.  Endocrine: Negative for hot flashes.  Genitourinary: Negative for difficulty urinating, dysuria and frequency.   Musculoskeletal: Negative for arthralgias.  Skin: Negative for itching and rash.  Neurological: Negative for light-headedness and numbness.  Hematological:  Negative for adenopathy. Does not  bruise/bleed easily.  Psychiatric/Behavioral: Negative for confusion.    No Known Allergies  Patient Active Problem List   Diagnosis Date Noted  . Stage 3b chronic kidney disease ([place]) 10/04/2019  . Brain lesion 10/04/2019  . Encounter for antineoplastic immunotherapy 07/19/2019  . Encounter for antineoplastic chemotherapy 07/19/2019  . CNS lesion 07/07/2019  . Anemia in stage 3b chronic kidney disease ([place]) 06/07/2019  . Goals of care, counseling/discussion 05/23/2019  . Renal cell carcinoma of left kidney ([place]) 05/23/2019  . Renal cell carcinoma ([place]) 05/23/2019  . Palliative care encounter   . History of vasculitis   . Thoracic lymphadenopathy   . Lung nodule   . CAD S/P percutaneous coronary angioplasty 05/02/2019  . Kidney mass 05/02/2019  . Hydronephrosis of left kidney 05/02/2019  . UTI (urinary tract infection) 05/02/2019  . AKI (acute kidney injury) ([place]) 05/02/2019  . Gross hematuria 05/02/2019  . Chronic diastolic CHF (congestive heart failure) ([place]) 05/02/2019  . Sepsis ([place]) 05/02/2019  . Severe sepsis ([place]) 05/02/2019  . Preop cardiovascular exam   . Hyperlipidemia 05/21/2016  . 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 Prediabetes 05/21/2016  . Alcohol abuse 05/18/2016  . Essential hypertension 05/18/2016  . Vasculitis, 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[place]) 05/18/2016  . Shortness of breath 05/17/2016     Past Medical History:  Diagnosis Date  . ANCA-positive vasculitis ([place])   . CAD (coronary artery disease)   . Essential hypertension   . HFrEF (heart failure with reduced ejection fraction) ([place])   . Hyperlipidemia LDL goal <70   . Ischemic cardiomyopathy   . Renal cell carcinoma ([place]) 05/23/2019  . Renal disorder      Past Surgical History:  Procedure Laterality Date  . CARDIAC SURGERY     cardiac cath with 2 stent placement  . LAPAROSCOPIC NEPHRECTOMY, HAND ASSISTED Left 05/05/2019   Procedure:  HAND ASSISTED LAPAROSCOPIC NEPHRECTOMY;  Surgeon: Hollice Espy, MD;  Location: [facility] ORS;  Service: Urology;  Laterality: Left;    Social History   Socioeconomic History  . Marital status: Married    Spouse name: Not on file  . Number of children: Not on file  . Years of education: Not on file  . Highest education level: Not on file  Occupational History  . Not on file  Tobacco Use  . Smoking status: Never Smoker  . Smokeless tobacco: Never Used  Substance and Sexual Activity  . Alcohol use: Yes  . Drug use: Not on file  . Sexual activity: Not on file  Other Topics Concern  . Not on file  Social History Narrative  . Not on file   Social Determinants of Health   Financial Resource Strain: Not on file  Food Insecurity: Not on file  Transportation Needs: Not on file  Physical Activity: Not on file  Stress: Not on file  Social Connections: Not on file  Intimate Partner Violence: Not on file     Family History  Problem Relation Age of Onset  . Cancer Mother   . Blindness Brother      Current Outpatient Medications:  .  aspirin 81 MG chewable tablet, Chew 1 tablet by mouth daily., Disp: , Rfl:  .  atorvastatin (LIPITOR) 80 MG tablet, Take 80 mg by mouth daily., Disp: , Rfl:  .  axitinib (INLYTA) 5 MG tablet, Take 1 tablet (5 mg total) by mouth 2 (two) times daily., Disp: 60 tablet, Rfl: 3 .  clopidogrel (PLAVIX) 75 MG tablet, Take 1 tablet  by mouth daily., Disp: , Rfl:  .  furosemide (LASIX) 20 MG tablet, Take 1 tablet (20 mg total) by mouth daily., Disp: 30 tablet, Rfl: 0 .  lisinopril (ZESTRIL) 10 MG tablet, Take 10 mg by mouth daily., Disp: , Rfl:  .  metoprolol succinate (TOPROL-XL) 100 MG 24 hr tablet, Take 1.5 tablets by mouth daily., Disp: , Rfl:  .  albendazole (ALBENZA) 200 MG tablet, Take 3 tablets (600 mg total) by mouth in the morning and at bedtime. (Patient not taking: Reported on 02/07/2020), Disp: 84 tablet, Rfl: 0 .  amLODipine (NORVASC) 10 MG tablet,  Take 1 tablet (10 mg total) by mouth daily. (Patient not taking: No sig reported), Disp: 90 tablet, Rfl: 1 .  praziquantel (BILTRICIDE) 600 MG tablet, Take 1 tablet (600 mg total) by mouth 3 (three) times daily. (Patient not taking: Reported on 02/07/2020), Disp: 42 tablet, Rfl: 0 .  predniSONE (DELTASONE) 1 MG tablet, Take 1 tablet (1 mg total) by mouth daily with breakfast., Disp: 5 tablet, Rfl: 0 .  predniSONE (DELTASONE) 10 MG tablet, Take 1 tablet (10 mg total) by mouth daily with breakfast. (Patient not taking: Reported on 02/07/2020), Disp: 22 tablet, Rfl: 0 .  predniSONE (DELTASONE) 2.5 MG tablet, Take 1 tablet (2.5 mg total) by mouth daily with breakfast., Disp: 28 tablet, Rfl: 0 .  predniSONE (DELTASONE) 20 MG tablet, Take 1 tablet (20 mg total) by mouth daily with breakfast., Disp: 35 tablet, Rfl: 0 .  predniSONE (DELTASONE) 5 MG tablet, Take 1 tablet (5 mg total) by mouth daily with breakfast., Disp: 28 tablet, Rfl: 0 .  predniSONE (DELTASONE) 50 MG tablet, This is a 60 day taper for treatment of neurocysticercosis-, Disp: 18 tablet, Rfl: 0   Physical exam:  Vitals:   02/07/20 0918  BP: (!) 132/99  Pulse: 90  Resp: 18  Temp: 97.8 F (36.6 C)  SpO2: 99%  Weight: 157 lb 11.2 oz (71.5 kg)   Physical Exam Constitutional:      General: He is not in acute distress.    Appearance: He is not diaphoretic.  HENT:     Head: Normocephalic and atraumatic.     Nose: Nose normal.     Mouth/Throat:     Pharynx: No oropharyngeal exudate.  Eyes:     General: No scleral icterus.    Pupils: Pupils are equal, round, and reactive to light.  Cardiovascular:     Rate and Rhythm: Normal rate and regular rhythm.     Heart sounds: No murmur heard.   Pulmonary:     Effort: Pulmonary effort is normal. No respiratory distress.     Breath sounds: No rales.  Chest:     Chest wall: No tenderness.  Abdominal:     General: There is no distension.     Palpations: Abdomen is soft.     Tenderness:  There is no abdominal tenderness.  Musculoskeletal:        General: Normal range of motion.     Cervical back: Normal range of motion and neck supple.  Skin:    General: Skin is warm and dry.     Findings: No erythema.  Neurological:     Mental Status: He is alert and oriented to person, place, and time.     Cranial Nerves: No cranial nerve deficit.     Motor: No abnormal muscle tone.     Coordination: Coordination normal.  Psychiatric:        Mood and Affect: Affect  normal.        CMP Latest Ref Rng & Units 02/07/2020  Glucose 70 - 99 mg/dL 193(H)  BUN 6 - 20 mg/dL 44(H)  Creatinine 0.61 - 1.24 mg/dL 1.71(H)  Sodium 135 - 145 mmol/L 134(L)  Potassium 3.5 - 5.1 mmol/L 4.5  Chloride 98 - 111 mmol/L 102  CO2 22 - 32 mmol/L 22  Calcium 8.9 - 10.3 mg/dL 8.7(L)  Total Protein 6.5 - 8.1 g/dL 6.5  Total Bilirubin 0.3 - 1.2 mg/dL 0.5  Alkaline Phos 38 - 126 U/L 101  AST 15 - 41 U/L 33  ALT 0 - 44 U/L 30   CBC Latest Ref Rng & Units 02/07/2020  WBC 4.0 - 10.5 K/uL 5.0  Hemoglobin 13.0 - 17.0 g/dL 14.5  Hematocrit 39.0 - 52.0 % 43.7  Platelets 150 - 400 K/uL 173    RADIOGRAPHIC STUDIES: I have personally reviewed the radiological images as listed and agreed with the findings in the report. CT CHEST ABDOMEN PELVIS WO CONTRAST  Addendum Date: 01/13/2020   ADDENDUM REPORT: 01/13/2020 11:45 ADDENDUM: There is no sign of aortic atherosclerosis on the current study. There is evidence of coronary artery disease. Please disregard the RAF code that was previously provided. Electronically Signed   By: Zetta Bills M.D.   On: 01/13/2020 11:45   Result Date: 01/13/2020 CLINICAL DATA:  History of renal cell carcinoma post LEFT nephrectomy in April of 2021. Ongoing therapy by report. EXAM: CT CHEST, ABDOMEN AND PELVIS WITHOUT CONTRAST TECHNIQUE: Multidetector CT imaging of the chest, abdomen and pelvis was performed following the standard protocol without IV contrast. COMPARISON:  CT  evaluation from May 03, 2019. FINDINGS: CT CHEST FINDINGS Cardiovascular: Noncontrast appearance of the heart great vessels is remarkable only for coronary artery calcification of LEFT coronary circulation and signs of prior percutaneous coronary intervention. Small calcification also in RIGHT coronary circulation along the RIGHT heart border. No pericardial effusion. Normal caliber thoracic aorta. Normal caliber central pulmonary vessels. Limited assessment of cardiovascular structures given lack of intravenous contrast. A Mediastinum/Nodes: Thoracic inlet structures are normal. No axillary lymphadenopathy. No mediastinal lymphadenopathy. No gross hilar adenopathy. Enhancing paramediastinal soft tissue nodule seen on the previous exam have nearly completely resolved. For instance, a small soft tissue nodule along the RIGHT anterior mediastinal margin (image 19, series 2) 4-5 mm previously in the range of 15 mm (on the April evaluation. As compared to the PET exam this area previously measured approximately 13 mm. Other areas which were larger than this focus on the prior study of April of 2021 are no longer evident. Lungs/Pleura: Nodule along the minor fissure in the RIGHT chest (image 69, series 5) 8 mm, previous 18 mm on the April study and 11 mm on the most recent comparison imaging, PET evaluation from August of 2021. Resolution of pleural fluid and thickening in the RIGHT chest and small pleural effusion in the LEFT chest since the prior study. No new pulmonary nodule. Nodules deep within the pleural space along the RIGHT hemidiaphragm and retrocrural implants seen on the previous study from April have resolved as on the previous PET evaluation. Musculoskeletal: See below for full musculoskeletal details. CT ABDOMEN PELVIS FINDINGS Hepatobiliary: Normal hepatic contour. No focal lesion on noncontrast imaging. No pericholecystic stranding. Pancreas: Normal contour no sign of inflammation. Spleen: Normal,  posteriorly oriented following LEFT nephrectomy. Adrenals/Urinary Tract: Adrenal glands are normal. No RIGHT-sided hydronephrosis or nephrolithiasis. Post LEFT nephrectomy as before. Suture line along the LEFT renal vasculature shows a  similar appearance and shows no sign of increasing nodularity or soft tissue thickening. Very subtle variation in LEFT psoas musculature while nonspecific and potentially artifact related, also suggested however on coronal images, very subtle change in muscular structure and asymmetry and may measure as much as 2 x 1.5 cm. Stomach/Bowel: Scattered colonic diverticulosis.  Normal appendix. Stomach distended with ingested contrast material. No small bowel dilation or signs of small bowel inflammation. Vascular/Lymphatic: Normal caliber abdominal aorta. There is no gastrohepatic or hepatoduodenal ligament lymphadenopathy. No retroperitoneal or mesenteric lymphadenopathy. The No pelvic sidewall lymphadenopathy. Reproductive: Prostate mildly enlarged. Other: Subtle nodularity in the soft tissues of the LEFT flank (image 76 of series 2 measuring approximately 7 mm. No ascites. No peritoneal nodularity. Musculoskeletal: Spinal degenerative changes. No acute or destructive bone process. 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 IMPRESSION: 1. Overall marked improvement with respect to disease in the chest, abdomen and pelvis when compared to the study of April 2021 and continued improvement when compared to the exam from August of 2021. 2. Very subtle variation in muscular architecture of the LEFT psoas muscle is of uncertain significance and potentially artifact related. However, given the potential for intramuscular metastatic lesions in the setting of renal cell carcinoma with consider short interval follow-up assessment with MRI. Subsequent follow-up imaging could also be performed with MRI of the abdomen for follow-up in this high-risk patient  with additional coverage provided with noncontrast CT as warranted. 3. Subtle nodularity in the soft tissues of the LEFT flank measuring approximately 7 mm. This could represent an injection site or area of contusion with given focal appearance would suggest attention on subsequent imaging. 4. 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 5. Post LEFT nephrectomy. 6. Coronary artery calcification and signs of prior percutaneous coronary intervention. 7. Aortic atherosclerosis. Aortic Atherosclerosis (ICD10-I70.0). Electronically Signed: By: Zetta Bills M.D. On: 01/12/2020 11:51    Assessment and plan-  Patient is a 60 y.o. male history of CAD, status post stent, CHF, history of ANCA positive sclerosing/crescentic GN, chronic kidney disease stage III presents for treatments of stage IV renal cell carcinoma 1. Renal cell carcinoma of left kidney (HCC)   2. Encounter for antineoplastic chemotherapy   3. Encounter for antineoplastic immunotherapy   4. History of CHF (congestive heart failure)   5. Stage 3b chronic kidney disease ([place])   Cancer Staging Renal cell carcinoma of left kidney ([place]) Staging form: Kidney, AJCC 8th Edition - Pathologic: Stage IV (pT3a, pNX, cM1) - Signed by Earlie Server, MD on 05/23/2019   # Stage IV renal cell carcinoma Labs reviewed and discussed with patient. Proceed with Keytruda treatment today Continue axitinib 5 mg twice daily.  #Hypertension, CHF Patient is not currently following up with primary care physician or cardiology.  I strongly recommend him to make an appointment follow-up with PCP.  He has no insurance coverage so currently not able to see cardiology Continue metoprolol, lisinopril.  Continue Lasix 20 mg daily. Blood pressure has improved today.   #Intermittent orthopnea, chronic issue for him. Pre-existing diastolic CHF, 2/70/3500 LVEF 60 to 65%.  Continue Lasix 20 mg daily. Recommend him to follow-up with  cardiology.  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  Follow-up with ID #[id number], creatinine stable, improved.  Avoid nephrotoxin.  We spent sufficient time to discuss many aspect of care, questions were answered to patient's satisfaction. Follow-up in 3 weeks for follow-up of evaluation prior  to immunotherapy. Thank you for allowing me to participate in the care of this patient.   Earlie Server, MD, PhD Hematology Oncology [facility] at [facility] Pager- [phone] 02/07/2020

## 2020-02-07 NOTE — Progress Notes (Signed)
Pt here for follow up. Pt reports that he started taking medicaiton for parasited in his head and it made him feel weak and with little appetite. Appetite is now coming back.

## 2020-02-07 NOTE — Progress Notes (Signed)
Pt received keytruda infusion in clinic today. Tolerated well.

## 2020-02-21 ENCOUNTER — Encounter: Payer: Self-pay | Admitting: Infectious Diseases

## 2020-02-21 ENCOUNTER — Other Ambulatory Visit: Payer: Self-pay | Admitting: Oncology

## 2020-02-21 ENCOUNTER — Other Ambulatory Visit: Payer: Self-pay

## 2020-02-21 ENCOUNTER — Ambulatory Visit: Payer: Self-pay | Attending: Infectious Diseases | Admitting: Infectious Diseases

## 2020-02-21 ENCOUNTER — Other Ambulatory Visit
Admission: RE | Admit: 2020-02-21 | Discharge: 2020-02-21 | Disposition: A | Discharge status: Home / Self Care | Payer: Self-pay | Attending: Infectious Diseases | Admitting: Infectious Diseases

## 2020-02-21 VITALS — BP 147/93 | HR 89 | Temp 97.2°F | Resp 16 | Ht 63.0 in | Wt 153.6 lb

## 2020-02-21 DIAGNOSIS — Z905 Acquired absence of kidney: Secondary | ICD-10-CM

## 2020-02-21 DIAGNOSIS — N1832 Chronic kidney disease, stage 3b: Secondary | ICD-10-CM

## 2020-02-21 DIAGNOSIS — B690 Cysticercosis of central nervous system: Secondary | ICD-10-CM | POA: Insufficient documentation

## 2020-02-21 DIAGNOSIS — R739 Hyperglycemia, unspecified: Secondary | ICD-10-CM

## 2020-02-21 DIAGNOSIS — Z85528 Personal history of other malignant neoplasm of kidney: Secondary | ICD-10-CM

## 2020-02-21 DIAGNOSIS — I10 Essential (primary) hypertension: Secondary | ICD-10-CM

## 2020-02-21 LAB — CBC WITH DIFFERENTIAL/PLATELET
Abs Immature Granulocytes: 0.85 10*3/uL — ABNORMAL HIGH (ref 0.00–0.07)
Basophils Absolute: 0.1 10*3/uL (ref 0.0–0.1)
Basophils Relative: 1 %
Eosinophils Absolute: 0 10*3/uL (ref 0.0–0.5)
Eosinophils Relative: 0 %
HCT: 50.9 % (ref 39.0–52.0)
Hemoglobin: 16.5 g/dL (ref 13.0–17.0)
Immature Granulocytes: 6 %
Lymphocytes Relative: 14 %
Lymphs Abs: 1.9 10*3/uL (ref 0.7–4.0)
MCH: 27.3 pg (ref 26.0–34.0)
MCHC: 32.4 g/dL (ref 30.0–36.0)
MCV: 84.1 fL (ref 80.0–100.0)
Monocytes Absolute: 1.3 10*3/uL — ABNORMAL HIGH (ref 0.1–1.0)
Monocytes Relative: 9 %
Neutro Abs: 10.2 10*3/uL — ABNORMAL HIGH (ref 1.7–7.7)
Neutrophils Relative %: 70 %
Platelets: 171 10*3/uL (ref 150–400)
RBC: 6.05 MIL/uL — ABNORMAL HIGH (ref 4.22–5.81)
RDW: 16.2 % — ABNORMAL HIGH (ref 11.5–15.5)
Smear Review: NORMAL
WBC: 14.3 10*3/uL — ABNORMAL HIGH (ref 4.0–10.5)
nRBC: 0.3 % — ABNORMAL HIGH (ref 0.0–0.2)

## 2020-02-21 LAB — COMPREHENSIVE METABOLIC PANEL
ALT: 44 U/L (ref 0–44)
AST: 41 U/L (ref 15–41)
Albumin: 3.9 g/dL (ref 3.5–5.0)
Alkaline Phosphatase: 86 U/L (ref 38–126)
Anion gap: 10 (ref 5–15)
BUN: 57 mg/dL — ABNORMAL HIGH (ref 6–20)
CO2: 23 mmol/L (ref 22–32)
Calcium: 9.8 mg/dL (ref 8.9–10.3)
Chloride: 104 mmol/L (ref 98–111)
Creatinine, Ser: 2.06 mg/dL — ABNORMAL HIGH (ref 0.61–1.24)
GFR, Estimated: 37 mL/min — ABNORMAL LOW (ref >60)
Glucose, Bld: 126 mg/dL — ABNORMAL HIGH (ref 70–99)
Potassium: 4.7 mmol/L (ref 3.5–5.1)
Sodium: 137 mmol/L (ref 135–145)
Total Bilirubin: 0.8 mg/dL (ref 0.3–1.2)
Total Protein: 7.3 g/dL (ref 6.5–8.1)

## 2020-02-21 LAB — HEMOGLOBIN A1C
Hgb A1c MFr Bld: 7.7 % — ABNORMAL HIGH (ref 4.8–5.6)
Mean Plasma Glucose: 174.29 mg/dL

## 2020-02-21 LAB — CK: Total CK: 87 U/L (ref 49–397)

## 2020-02-21 NOTE — Progress Notes (Signed)
NAME: Mark Donaldson  DOB: April 23, 1960  MRN: [id number]  Date/Time: 02/21/2020 11:07 AM  Subjective:   ?[place] Spanish interpreter in the room Mark Donaldson is a 60 y.o. 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  On a routine examination to stage his renal carcinoma he was found to have 2 cystic lesions with scolex on the right side and 2 calcified lesions on the left side.  Tinea Solian IgG sent to [facility] was positive.  Patient was started on treatment for neurocysticercosis on 01/20/2020.  He was given 2 weeks of albendazole and praziquantel which he completed on 02/04/2020. He is also on tapering dose of steroids since 01/20/2020. Patient says he had dizziness, poor appetite, difficulty in swallowing l and hair loss since starting the antihelminthic treatment.  Most of his symptoms have improved except that the hair loss persist.  He also has lost 11 pounds since 01/19/2020.  He had abdominal discomfort and blood in the stool when he was taking the anti-Helman treatment.  He does not see any more blood but still has some frequent defecation.  Stool is better formed now.  Patient is getting pembrolizumab and axitinib for his renal carcinoma. Past Medical History:  Diagnosis Date  . 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 CAD (coronary artery disease)   . Essential hypertension   . HFrEF (heart failure with reduced ejection fraction) ([place])   . Hyperlipidemia LDL goal <70   . Ischemic cardiomyopathy   . Renal cell carcinoma ([place]) 05/23/2019  . Renal disorder     Past Surgical History:  Procedure Laterality Date  . CARDIAC SURGERY     cardiac cath with 2 stent placement  . LAPAROSCOPIC NEPHRECTOMY, HAND ASSISTED Left 05/05/2019   Procedure: HAND  ASSISTED LAPAROSCOPIC NEPHRECTOMY;  Surgeon: Hollice Espy, MD;  Location: [facility] ORS;  Service: Urology;  Laterality: Left;    Social History   Socioeconomic History  . Marital status: Married    Spouse name: Not on file  . Number of children: Not on file  . Years of education: Not on file  . Highest education level: Not on file  Occupational History  . Not on file  Tobacco Use  . Smoking status: Never Smoker  . Smokeless tobacco: Never Used  Substance and Sexual Activity  . Alcohol use: Yes  . Drug use: Not on file  . Sexual activity: Not on file  Other Topics Concern  . Not on file  Social History Narrative  . Not on file   Social Determinants of Health   Financial Resource Strain: Not on file  Food Insecurity: Not on file  Transportation Needs: Not on file  Physical Activity: Not on file  Stress: Not on file  Social Connections: Not on file  Intimate Partner Violence: Not on file    Family History  Problem Relation Age of Onset  . Cancer Mother   . Blindness Brother    No Known Allergies  ? Current Outpatient Medications  Medication Sig Dispense Refill  . aspirin 81 MG chewable tablet Chew 1 tablet by mouth daily.    Marland Kitchen atorvastatin (LIPITOR) 80 MG tablet Take 80 mg by mouth daily.    Marland Kitchen axitinib (INLYTA) 5 MG tablet Take 1 tablet (5 mg total) by mouth 2 (two) times daily. 60 tablet 3  .  clopidogrel (PLAVIX) 75 MG tablet Take 1 tablet by mouth daily.    . furosemide (LASIX) 20 MG tablet Take 1 tablet (20 mg total) by mouth daily. 30 tablet 3  . lisinopril (ZESTRIL) 10 MG tablet Take 10 mg by mouth daily.    . metoprolol succinate (TOPROL-XL) 100 MG 24 hr tablet Take 1.5 tablets by mouth daily.    . predniSONE (DELTASONE) 1 MG tablet Take 1 tablet (1 mg total) by mouth daily with breakfast. 5 tablet 0  . predniSONE (DELTASONE) 2.5 MG tablet Take 1 tablet (2.5 mg total) by mouth daily with breakfast. 28 tablet 0  . predniSONE (DELTASONE) 20 MG tablet Take 1 tablet  (20 mg total) by mouth daily with breakfast. 35 tablet 0  . predniSONE (DELTASONE) 5 MG tablet Take 1 tablet (5 mg total) by mouth daily with breakfast. 28 tablet 0  . predniSONE (DELTASONE) 50 MG tablet This is a 60 day taper for treatment of neurocysticercosis- 18 tablet 0   No current facility-administered medications for this visit.     Abtx:  Anti-infectives (From admission, onward)   None      REVIEW OF SYSTEMS:  Const: negative fever, negative chills, positive weight loss Eyes: Chronic poor vision right eye, negative eye pain ENT: negative coryza, negative sore throat Resp: negative cough, hemoptysis, has dyspnea Cards: negative for chest pain, palpitations, lower extremity edema GU: negative for frequency, dysuria and hematuria GI: See above Skin: negative for rash and pruritus Heme: negative for easy bruising and gum/nose bleeding MS: Generalized weakness Neurolo:, dizziness, headaches Psych: negative for feelings of anxiety, depression  Endocrine: negative for thyroid, diabetes Allergy/Immunology- negative for any medication or food allergies ?  Objective:  VITALS:  BP (!) 147/93   Pulse 89   Temp (!) 97.2 F (36.2 C) (Tympanic)   Resp 16   Ht 5\' 3"  (1.6 m)   Wt 153 lb 9.6 oz (69.7 kg)   SpO2 98%   BMI 27.21 kg/m  PHYSICAL EXAM:  General: Alert, cooperative, no distress, appears stated age.  Head: Normocephalic, without obvious abnormality, atraumatic.  Hair loss patient has shaved his head Eyes: Conjunctivae clear, anicteric sclerae. Pupils are equal ENT Nares normal. No drainage or sinus tenderness. Lips, mucosa, and tongue normal. No Thrush Neck: Supple, symmetrical, no adenopathy, thyroid: non tender no carotid bruit and no JVD. Back: No CVA tenderness. Lungs: Clear to auscultation bilaterally. No Wheezing or Rhonchi. No rales. Heart: Regular rate and rhythm, no murmur, rub or gallop. Abdomen: Soft, non-tender,not distended. Bowel sounds normal. No  masses Extremities: atraumatic, no cyanosis. No edema. No clubbing Skin: No rashes or lesions. Or bruising Lymph: Cervical, supraclavicular normal. Neurologic: Grossly non-focal Pertinent Labs Lab Results CBC    Component Value Date/Time   WBC 5.0 02/07/2020 0852   RBC 5.35 02/07/2020 0852   HGB 14.5 02/07/2020 0852   HCT 43.7 02/07/2020 0852   PLT 173 02/07/2020 0852   MCV 81.7 02/07/2020 0852   MCH 27.1 02/07/2020 0852   MCHC 33.2 02/07/2020 0852   RDW 13.8 02/07/2020 0852   LYMPHSABS 0.8 02/07/2020 0852   MONOABS 0.2 02/07/2020 0852   EOSABS 0.0 02/07/2020 0852   BASOSABS 0.0 02/07/2020 0852    CMP Latest Ref Rng & Units 02/07/2020 01/17/2020 12/27/2019  Glucose 70 - 99 mg/dL 193(H) 195(H) 179(H)  BUN 6 - 20 mg/dL 44(H) 39(H) 46(H)  Creatinine 0.61 - 1.24 mg/dL 1.71(H) 2.04(H) 2.10(H)  Sodium 135 - 145 mmol/L 134(L) 135 136  Potassium 3.5 - 5.1 mmol/L 4.5 4.7 4.2  Chloride 98 - 111 mmol/L 102 104 104  CO2 22 - 32 mmol/L 22 18(L) 22  Calcium 8.9 - 10.3 mg/dL 8.7(L) 9.5 8.8(L)  Total Protein 6.5 - 8.1 g/dL 6.5 7.2 6.7  Total Bilirubin 0.3 - 1.2 mg/dL 0.5 0.7 0.8  Alkaline Phos 38 - 126 U/L 101 106 98  AST 15 - 41 U/L 33 33 23  ALT 0 - 44 U/L 30 28 19       ? Impression/Recommendation ?  ?Neurocysticercosis of the brain.  He has 4 lesions in the MRI of the brain.  2 on the right side are active cysts with scolex s seen in them the 2 on the left side are calcified.  Patient recently finished 2 weeks of albendazole plus praziquantel on 02/04/2020.  He experienced a few side effects including difficulty in swallowing, poor appetite, weight loss of 11 pounds, bloody stool, abdominal cramping and diarrhea.  All of the symptoms have improved since he completed the treatment.  Patient has also been on a tapering course of steroids since 01/20/2020.  Today is day 33 of prednisone and he is on a tapering dose.  He started with 60 mg and now he is on 27.5 mg.  The plan is to taper over  60 days in order to prevent rebound seizures.  Patient's current complaint is hair loss both on his head and on his face.  This may be a prednisone effect.  All of his other side effects he had have improved.  His appetite is improving.  He has some abdominal discomfort with frequent stools but better than before we will send labs today CBC, CMP and will follow him back in 2 weeks.  Renal carcinoma status post nephrectomy on axitinib and pembrolizumab.  Watch closely as both these medications have many side effects. ? _Hypertension on metoprolol, amlodipine and furosemide  CKD  CAD status post stent on Plavix Hyperglycemia since Nov 2021- before the steroids started- could be due to cancer meds- discussed with Dr.Zhou- will get Hba1c   __________________________________________________ Discussed with patient in great detail Spent 45 min with the patient in counseling   Note:  This document was prepared using Dragon voice recognition software and may include unintentional dictation errors.

## 2020-02-21 NOTE — Patient Instructions (Signed)
You are here for follow up of the neurocysticerosis- you completed 2 weeks of albendazole and praziquantel- you are on tapering dose of steroids- you complain of telogen effluvium, hair loss over your body, you had some blood in your stool which has resolved now. You have some bleeding from you nostrils- you can use saline spray. Will do labs today and will see you back in 2 weeks

## 2020-02-27 ENCOUNTER — Other Ambulatory Visit: Payer: Self-pay | Admitting: Oncology

## 2020-02-28 ENCOUNTER — Inpatient Hospital Stay: Payer: Self-pay | Attending: Oncology

## 2020-02-28 ENCOUNTER — Encounter: Payer: Self-pay | Admitting: Oncology

## 2020-02-28 ENCOUNTER — Inpatient Hospital Stay (HOSPITAL_BASED_OUTPATIENT_CLINIC_OR_DEPARTMENT_OTHER): Payer: Self-pay | Admitting: Oncology

## 2020-02-28 ENCOUNTER — Inpatient Hospital Stay: Payer: Self-pay

## 2020-02-28 VITALS — BP 116/84 | HR 93 | Temp 97.0°F | Resp 18 | Wt 154.9 lb

## 2020-02-28 DIAGNOSIS — I700 Atherosclerosis of aorta: Secondary | ICD-10-CM | POA: Insufficient documentation

## 2020-02-28 DIAGNOSIS — Z905 Acquired absence of kidney: Secondary | ICD-10-CM | POA: Insufficient documentation

## 2020-02-28 DIAGNOSIS — Z9862 Peripheral vascular angioplasty status: Secondary | ICD-10-CM | POA: Insufficient documentation

## 2020-02-28 DIAGNOSIS — E1165 Type 2 diabetes mellitus with hyperglycemia: Secondary | ICD-10-CM | POA: Insufficient documentation

## 2020-02-28 DIAGNOSIS — B690 Cysticercosis of central nervous system: Secondary | ICD-10-CM | POA: Insufficient documentation

## 2020-02-28 DIAGNOSIS — I5042 Chronic combined systolic (congestive) and diastolic (congestive) heart failure: Secondary | ICD-10-CM | POA: Insufficient documentation

## 2020-02-28 DIAGNOSIS — E785 Hyperlipidemia, unspecified: Secondary | ICD-10-CM | POA: Insufficient documentation

## 2020-02-28 DIAGNOSIS — Z7901 Long term (current) use of anticoagulants: Secondary | ICD-10-CM | POA: Insufficient documentation

## 2020-02-28 DIAGNOSIS — Z5112 Encounter for antineoplastic immunotherapy: Secondary | ICD-10-CM

## 2020-02-28 DIAGNOSIS — C642 Malignant neoplasm of left kidney, except renal pelvis: Secondary | ICD-10-CM

## 2020-02-28 DIAGNOSIS — I255 Ischemic cardiomyopathy: Secondary | ICD-10-CM | POA: Insufficient documentation

## 2020-02-28 DIAGNOSIS — N1832 Chronic kidney disease, stage 3b: Secondary | ICD-10-CM | POA: Insufficient documentation

## 2020-02-28 DIAGNOSIS — I2510 Atherosclerotic heart disease of native coronary artery without angina pectoris: Secondary | ICD-10-CM | POA: Insufficient documentation

## 2020-02-28 DIAGNOSIS — I130 Hypertensive heart and chronic kidney disease with heart failure and stage 1 through stage 4 chronic kidney disease, or unspecified chronic kidney disease: Secondary | ICD-10-CM | POA: Insufficient documentation

## 2020-02-28 DIAGNOSIS — R739 Hyperglycemia, unspecified: Secondary | ICD-10-CM

## 2020-02-28 DIAGNOSIS — Z5111 Encounter for antineoplastic chemotherapy: Secondary | ICD-10-CM

## 2020-02-28 DIAGNOSIS — Z8679 Personal history of other diseases of the circulatory system: Secondary | ICD-10-CM

## 2020-02-28 DIAGNOSIS — Z7982 Long term (current) use of aspirin: Secondary | ICD-10-CM | POA: Insufficient documentation

## 2020-02-28 DIAGNOSIS — C7800 Secondary malignant neoplasm of unspecified lung: Secondary | ICD-10-CM | POA: Insufficient documentation

## 2020-02-28 LAB — COMPREHENSIVE METABOLIC PANEL
ALT: 32 U/L (ref 0–44)
AST: 27 U/L (ref 15–41)
Albumin: 3.3 g/dL — ABNORMAL LOW (ref 3.5–5.0)
Alkaline Phosphatase: 84 U/L (ref 38–126)
Anion gap: 11 (ref 5–15)
BUN: 57 mg/dL — ABNORMAL HIGH (ref 6–20)
CO2: 20 mmol/L — ABNORMAL LOW (ref 22–32)
Calcium: 8.7 mg/dL — ABNORMAL LOW (ref 8.9–10.3)
Chloride: 104 mmol/L (ref 98–111)
Creatinine, Ser: 2.05 mg/dL — ABNORMAL HIGH (ref 0.61–1.24)
GFR, Estimated: 37 mL/min — ABNORMAL LOW (ref >60)
Glucose, Bld: 153 mg/dL — ABNORMAL HIGH (ref 70–99)
Potassium: 4.9 mmol/L (ref 3.5–5.1)
Sodium: 135 mmol/L (ref 135–145)
Total Bilirubin: 0.9 mg/dL (ref 0.3–1.2)
Total Protein: 6.3 g/dL — ABNORMAL LOW (ref 6.5–8.1)

## 2020-02-28 LAB — CBC WITH DIFFERENTIAL/PLATELET
Abs Immature Granulocytes: 0.27 10*3/uL — ABNORMAL HIGH (ref 0.00–0.07)
Basophils Absolute: 0 10*3/uL (ref 0.0–0.1)
Basophils Relative: 0 %
Eosinophils Absolute: 0 10*3/uL (ref 0.0–0.5)
Eosinophils Relative: 0 %
HCT: 47.5 % (ref 39.0–52.0)
Hemoglobin: 15.6 g/dL (ref 13.0–17.0)
Immature Granulocytes: 2 %
Lymphocytes Relative: 14 %
Lymphs Abs: 1.9 10*3/uL (ref 0.7–4.0)
MCH: 27.6 pg (ref 26.0–34.0)
MCHC: 32.8 g/dL (ref 30.0–36.0)
MCV: 84.1 fL (ref 80.0–100.0)
Monocytes Absolute: 0.9 10*3/uL (ref 0.1–1.0)
Monocytes Relative: 7 %
Neutro Abs: 10.1 10*3/uL — ABNORMAL HIGH (ref 1.7–7.7)
Neutrophils Relative %: 77 %
Platelets: 153 10*3/uL (ref 150–400)
RBC: 5.65 MIL/uL (ref 4.22–5.81)
RDW: 15.9 % — ABNORMAL HIGH (ref 11.5–15.5)
WBC: 13.2 10*3/uL — ABNORMAL HIGH (ref 4.0–10.5)
nRBC: 0 % (ref 0.0–0.2)

## 2020-02-28 MED ORDER — SODIUM CHLORIDE 0.9 % IV SOLN
Freq: Once | INTRAVENOUS | Status: AC
  Filled 2020-02-28: qty 250

## 2020-02-28 MED ORDER — SODIUM CHLORIDE 0.9 % IV SOLN
200.0000 mg | Freq: Once | INTRAVENOUS | Status: AC
  Administered 2020-02-28: 200 mg via INTRAVENOUS
  Filled 2020-02-28: qty 8

## 2020-02-28 MED ORDER — AXITINIB 5 MG PO TABS: 5.0000 mg | ORAL_TABLET | Freq: Two times a day (BID) | ORAL | 3 refills | Status: DC

## 2020-02-28 NOTE — Progress Notes (Signed)
Hematology/Oncology Progress Note [facility] Telephone:([phone] [phone]  Patient Care Team: Center, [facility] as PCP - General (General Practice)   Name of the patient: Mark Donaldson  [id number]  [birth year]  Date of visit: 02/28/20   INTERVAL HISTORY-  60 y.o. male presents for follow-up of renal cell carcinoma treatments. Patient has past medical history CAD status post PCI to LAD in 2018, on aspirin and Plavix, CHF, history of focal sclerosing/crescentic GN-ANCA positive and CKD stage III. He was hospitalized from 05/02/2019-05/07/2019 due to gross hematuria. CT showed a 7.4 cm left lower pole renal mass concerning for RCC.  Patient also has necrotic lymphadenopathy in the mediastinum and the right supra hilar region which were highly suspicious for metastatic disease.  Bilateral lung nodules, also concerning for metastatic lung disease.  Right retrocrural lymphadenopathy with upper normal left periaortic lymph node. Patient was recommended for cytoreductive radical nephrectomy.  With history of ANCA positive vasculitis, also recommend thoracic lymphadenopathy biopsy to confirm distal metastasis.  Patient agreed to radical nephrectomy and declined bronchoscopy biopsy. He underwent left radical nephrectomy on 05/05/2019. Pathology showed pT3a pNx, RCC, conventional clear cell type, grade 3 Patient was discharged home. Today he presents to discuss pathology and the management plan. He was accompanied by his wife and daughter. Patient reports that he uses hydrocodone for pain.  Pain around the surgical site has improved.  He occasionally has tingling sensation around the surgical site.  Denies any fever or chills, drainage from the surgical site.  # Interval CT chest abdomen pelvis was independently reviewed by me and discussed with patient. 01/12/2020, CT showed overall marked improvement.  Comparing to CT scan in August 2021,  continues to have treatment response.Mediastinal soft tissue nodule nearly completely resolved, right minor fissure nodule 8 mm, decreased in size.  Resolution of pleural fluid and thickening of the right chest.  Small pleural effusion in the left chest.  No new lung nodules.-Subtle variation of the muscle architecture of the left psoas muscle of unknown significance.-Continue observation.  He is asymptomatic. Subtle nodule in the soft tissue of the left flank.   INTERVAL HISTORY Mark Donaldson is a 60 y.o. male who has above history reviewed by me today presents for follow up visit for management of metastatic RCC Problems and complaints are listed below: Patient is Spanish-speaking.  Spanish interpreter present for the entire encounter for translation Patient reports doing well and he is accompanied by his son today. He takes axitinib 5 mg twice daily. Complaint about hair loss. 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 He finished 14 days of albendazole plus praziquantel on 02/04/2020.   He has had few side effects including poor appetite, hair loss, weight loss, bloody stool, abdominal cramping or diarrhea. All symptoms have improved since he completed the treatment. Patient is currently on a tapering course of prednisone. It has been noted that his glucose level has been elevated in December 2021, even before he was started on prednisone treatments. Dr. Steva Ready has obtained A1c which came back elevated at 8.7.  Patient continues to have chronic intermittent shortness of breath at night which improved after sitting up. Lasix partially improves the symptoms.   Review of systems- Review of Systems  Constitutional: Negative for appetite change, chills, fatigue, fever and unexpected weight change.  HENT:   Negative for voice change.   Eyes: Negative for eye problems and icterus.  Respiratory: Negative for chest tightness and cough.  Occasionally SOB  at night  Cardiovascular: Negative for chest pain and leg swelling.  Gastrointestinal: Negative for abdominal distention and abdominal pain.  Endocrine: Negative for hot flashes.  Genitourinary: Negative for difficulty urinating, dysuria and frequency.   Musculoskeletal: Negative for arthralgias.  Skin: Negative for itching and rash.       Hair loss  Neurological: Negative for light-headedness and numbness.  Hematological: Negative for adenopathy. Does not bruise/bleed easily.  Psychiatric/Behavioral: Negative for confusion.    No Known Allergies  Patient Active Problem List   Diagnosis Date Noted  . Hyperglycemia 02/28/2020  . Stage 3b chronic kidney disease ([place]) 10/04/2019  . Brain lesion 10/04/2019  . Encounter for antineoplastic immunotherapy 07/19/2019  . Encounter for antineoplastic chemotherapy 07/19/2019  . CNS lesion 07/07/2019  . Anemia in stage 3b chronic kidney disease ([place]) 06/07/2019  . Goals of care, counseling/discussion 05/23/2019  . Renal cell carcinoma of left kidney ([place]) 05/23/2019  . Renal cell carcinoma ([place]) 05/23/2019  . Palliative care encounter   . History of vasculitis   . Thoracic lymphadenopathy   . Lung nodule   . CAD S/P percutaneous coronary angioplasty 05/02/2019  . Kidney mass 05/02/2019  . Hydronephrosis of left kidney 05/02/2019  . UTI (urinary tract infection) 05/02/2019  . AKI (acute kidney injury) ([place]) 05/02/2019  . Gross hematuria 05/02/2019  . Chronic diastolic CHF (congestive heart failure) ([place]) 05/02/2019  . Sepsis ([place]) 05/02/2019  . Severe sepsis ([place]) 05/02/2019  . Preop cardiovascular exam   . Hyperlipidemia 05/21/2016  . 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 Prediabetes 05/21/2016  . Alcohol abuse 05/18/2016  . Essential hypertension 05/18/2016  . Vasculitis, 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[place]) 05/18/2016  . Shortness of breath 05/17/2016     Past Medical History:  Diagnosis Date  . ANCA-positive  vasculitis ([place])   . CAD (coronary artery disease)   . Essential hypertension   . HFrEF (heart failure with reduced ejection fraction) ([place])   . Hyperlipidemia LDL goal <70   . Ischemic cardiomyopathy   . Renal cell carcinoma ([place]) 05/23/2019  . Renal disorder      Past Surgical History:  Procedure Laterality Date  . CARDIAC SURGERY     cardiac cath with 2 stent placement  . LAPAROSCOPIC NEPHRECTOMY, HAND ASSISTED Left 05/05/2019   Procedure: HAND ASSISTED LAPAROSCOPIC NEPHRECTOMY;  Surgeon: Hollice Espy, MD;  Location: [facility] ORS;  Service: Urology;  Laterality: Left;    Social History   Socioeconomic History  . Marital status: Married    Spouse name: Not on file  . Number of children: Not on file  . Years of education: Not on file  . Highest education level: Not on file  Occupational History  . Not on file  Tobacco Use  . Smoking status: Never Smoker  . Smokeless tobacco: Never Used  Substance and Sexual Activity  . Alcohol use: Yes  . Drug use: Not on file  . Sexual activity: Not on file  Other Topics Concern  . Not on file  Social History Narrative  . Not on file   Social Determinants of Health   Financial Resource Strain: Not on file  Food Insecurity: Not on file  Transportation Needs: Not on file  Physical Activity: Not on file  Stress: Not on file  Social Connections: Not on file  Intimate Partner Violence: Not on file     Family History  Problem Relation Age of Onset  . Cancer Mother   . Blindness Brother  Current Outpatient Medications:  .  aspirin 81 MG EC tablet, Take 81 mg by mouth daily. Swallow whole., Disp: , Rfl:  .  atorvastatin (LIPITOR) 80 MG tablet, Take 80 mg by mouth daily., Disp: , Rfl:  .  clopidogrel (PLAVIX) 75 MG tablet, Take 75 mg by mouth daily., Disp: , Rfl:  .  furosemide (LASIX) 20 MG tablet, Take 1 tablet (20 mg total) by mouth daily., Disp: 30 tablet, Rfl: 3 .  KEYTRUDA 100 MG/4ML SOLN, INJECT 200MG  INTRAVENOUSLY  EVERY 3 WEEKS, Disp: 8 mL, Rfl: 11 .  lisinopril (ZESTRIL) 10 MG tablet, Take 10 mg by mouth daily., Disp: , Rfl:  .  metoprolol succinate (TOPROL-XL) 100 MG 24 hr tablet, Take 1.5 tablets by mouth daily., Disp: , Rfl:  .  axitinib (INLYTA) 5 MG tablet, Take 1 tablet (5 mg total) by mouth 2 (two) times daily., Disp: 60 tablet, Rfl: 3 .  predniSONE (DELTASONE) 1 MG tablet, Take 1 tablet (1 mg total) by mouth daily with breakfast. (Patient not taking: Reported on 02/28/2020), Disp: 5 tablet, Rfl: 0 .  predniSONE (DELTASONE) 2.5 MG tablet, Take 1 tablet (2.5 mg total) by mouth daily with breakfast. (Patient not taking: Reported on 02/28/2020), Disp: 28 tablet, Rfl: 0 .  predniSONE (DELTASONE) 20 MG tablet, Take 1 tablet (20 mg total) by mouth daily with breakfast. (Patient not taking: Reported on 02/28/2020), Disp: 35 tablet, Rfl: 0 .  predniSONE (DELTASONE) 5 MG tablet, Take 1 tablet (5 mg total) by mouth daily with breakfast. (Patient not taking: Reported on 02/28/2020), Disp: 28 tablet, Rfl: 0 .  predniSONE (DELTASONE) 50 MG tablet, This is a 60 day taper for treatment of neurocysticercosis- (Patient not taking: Reported on 02/28/2020), Disp: 18 tablet, Rfl: 0   Physical exam:  Vitals:   02/28/20 0944  BP: 116/84  Pulse: 93  Resp: 18  Temp: (!) 97 F (36.1 C)  Weight: 154 lb 14.4 oz (70.3 kg)   Physical Exam Constitutional:      General: He is not in acute distress.    Appearance: He is not diaphoretic.  HENT:     Head: Normocephalic and atraumatic.     Nose: Nose normal.     Mouth/Throat:     Pharynx: No oropharyngeal exudate.  Eyes:     General: No scleral icterus.    Pupils: Pupils are equal, round, and reactive to light.  Cardiovascular:     Rate and Rhythm: Normal rate and regular rhythm.     Heart sounds: No murmur heard.   Pulmonary:     Effort: Pulmonary effort is normal. No respiratory distress.     Breath sounds: No rales.  Chest:     Chest wall: No tenderness.   Abdominal:     General: There is no distension.     Palpations: Abdomen is soft.     Tenderness: There is no abdominal tenderness.  Musculoskeletal:        General: Normal range of motion.     Cervical back: Normal range of motion and neck supple.  Skin:    General: Skin is warm and dry.     Findings: No erythema.  Neurological:     Mental Status: He is alert and oriented to person, place, and time.     Cranial Nerves: No cranial nerve deficit.     Motor: No abnormal muscle tone.     Coordination: Coordination normal.  Psychiatric:        Mood and Affect: Affect  normal.        CMP Latest Ref Rng & Units 02/28/2020  Glucose 70 - 99 mg/dL 153(H)  BUN 6 - 20 mg/dL 57(H)  Creatinine 0.61 - 1.24 mg/dL 2.05(H)  Sodium 135 - 145 mmol/L 135  Potassium 3.5 - 5.1 mmol/L 4.9  Chloride 98 - 111 mmol/L 104  CO2 22 - 32 mmol/L 20(L)  Calcium 8.9 - 10.3 mg/dL 8.7(L)  Total Protein 6.5 - 8.1 g/dL 6.3(L)  Total Bilirubin 0.3 - 1.2 mg/dL 0.9  Alkaline Phos 38 - 126 U/L 84  AST 15 - 41 U/L 27  ALT 0 - 44 U/L 32   CBC Latest Ref Rng & Units 02/28/2020  WBC 4.0 - 10.5 K/uL 13.2(H)  Hemoglobin 13.0 - 17.0 g/dL 15.6  Hematocrit 39.0 - 52.0 % 47.5  Platelets 150 - 400 K/uL 153    RADIOGRAPHIC STUDIES: I have personally reviewed the radiological images as listed and agreed with the findings in the report. CT CHEST ABDOMEN PELVIS WO CONTRAST  Addendum Date: 01/13/2020   ADDENDUM REPORT: 01/13/2020 11:45 ADDENDUM: There is no sign of aortic atherosclerosis on the current study. There is evidence of coronary artery disease. Please disregard the RAF code that was previously provided. Electronically Signed   By: Zetta Bills M.D.   On: 01/13/2020 11:45   Result Date: 01/13/2020 CLINICAL DATA:  History of renal cell carcinoma post LEFT nephrectomy in April of 2021. Ongoing therapy by report. EXAM: CT CHEST, ABDOMEN AND PELVIS WITHOUT CONTRAST TECHNIQUE: Multidetector CT imaging of the chest,  abdomen and pelvis was performed following the standard protocol without IV contrast. COMPARISON:  CT evaluation from May 03, 2019. FINDINGS: CT CHEST FINDINGS Cardiovascular: Noncontrast appearance of the heart great vessels is remarkable only for coronary artery calcification of LEFT coronary circulation and signs of prior percutaneous coronary intervention. Small calcification also in RIGHT coronary circulation along the RIGHT heart border. No pericardial effusion. Normal caliber thoracic aorta. Normal caliber central pulmonary vessels. Limited assessment of cardiovascular structures given lack of intravenous contrast. A Mediastinum/Nodes: Thoracic inlet structures are normal. No axillary lymphadenopathy. No mediastinal lymphadenopathy. No gross hilar adenopathy. Enhancing paramediastinal soft tissue nodule seen on the previous exam have nearly completely resolved. For instance, a small soft tissue nodule along the RIGHT anterior mediastinal margin (image 19, series 2) 4-5 mm previously in the range of 15 mm (on the April evaluation. As compared to the PET exam this area previously measured approximately 13 mm. Other areas which were larger than this focus on the prior study of April of 2021 are no longer evident. Lungs/Pleura: Nodule along the minor fissure in the RIGHT chest (image 69, series 5) 8 mm, previous 18 mm on the April study and 11 mm on the most recent comparison imaging, PET evaluation from August of 2021. Resolution of pleural fluid and thickening in the RIGHT chest and small pleural effusion in the LEFT chest since the prior study. No new pulmonary nodule. Nodules deep within the pleural space along the RIGHT hemidiaphragm and retrocrural implants seen on the previous study from April have resolved as on the previous PET evaluation. Musculoskeletal: See below for full musculoskeletal details. CT ABDOMEN PELVIS FINDINGS Hepatobiliary: Normal hepatic contour. No focal lesion on noncontrast  imaging. No pericholecystic stranding. Pancreas: Normal contour no sign of inflammation. Spleen: Normal, posteriorly oriented following LEFT nephrectomy. Adrenals/Urinary Tract: Adrenal glands are normal. No RIGHT-sided hydronephrosis or nephrolithiasis. Post LEFT nephrectomy as before. Suture line along the LEFT renal vasculature shows a  similar appearance and shows no sign of increasing nodularity or soft tissue thickening. Very subtle variation in LEFT psoas musculature while nonspecific and potentially artifact related, also suggested however on coronal images, very subtle change in muscular structure and asymmetry and may measure as much as 2 x 1.5 cm. Stomach/Bowel: Scattered colonic diverticulosis.  Normal appendix. Stomach distended with ingested contrast material. No small bowel dilation or signs of small bowel inflammation. Vascular/Lymphatic: Normal caliber abdominal aorta. There is no gastrohepatic or hepatoduodenal ligament lymphadenopathy. No retroperitoneal or mesenteric lymphadenopathy. The No pelvic sidewall lymphadenopathy. Reproductive: Prostate mildly enlarged. Other: Subtle nodularity in the soft tissues of the LEFT flank (image 76 of series 2 measuring approximately 7 mm. No ascites. No peritoneal nodularity. Musculoskeletal: Spinal degenerative changes. No acute or destructive bone process. 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 IMPRESSION: 1. Overall marked improvement with respect to disease in the chest, abdomen and pelvis when compared to the study of April 2021 and continued improvement when compared to the exam from August of 2021. 2. Very subtle variation in muscular architecture of the LEFT psoas muscle is of uncertain significance and potentially artifact related. However, given the potential for intramuscular metastatic lesions in the setting of renal cell carcinoma with consider short interval follow-up assessment with MRI. Subsequent  follow-up imaging could also be performed with MRI of the abdomen for follow-up in this high-risk patient with additional coverage provided with noncontrast CT as warranted. 3. Subtle nodularity in the soft tissues of the LEFT flank measuring approximately 7 mm. This could represent an injection site or area of contusion with given focal appearance would suggest attention on subsequent imaging. 4. 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 5. Post LEFT nephrectomy. 6. Coronary artery calcification and signs of prior percutaneous coronary intervention. 7. Aortic atherosclerosis. Aortic Atherosclerosis (ICD10-I70.0). Electronically Signed: By: Zetta Bills M.D. On: 01/12/2020 11:51    Assessment and plan-  Patient is a 60 y.o. male history of CAD, status post stent, CHF, history of ANCA positive sclerosing/crescentic GN, chronic kidney disease stage III presents for treatments of stage IV renal cell carcinoma 1. Renal cell carcinoma of left kidney (HCC)   2. Encounter for antineoplastic chemotherapy   3. Encounter for antineoplastic immunotherapy   4. Hyperglycemia   5. History of CHF (congestive heart failure)   Cancer Staging Renal cell carcinoma of left kidney ([place]) Staging form: Kidney, AJCC 8th Edition - Pathologic: Stage IV (pT3a, pNX, cM1) - Signed by Earlie Server, MD on 05/23/2019   # Stage IV renal cell carcinoma Labs reviewed and discussed with patient Proceed with Keytruda treatments today Continue axitinib 5 mg twice daily . #Hypertension, CHF Continue metoprolol, lisinopril.  Continue Lasix 20 mg daily. Recommend patient to follow-up with primary care provider.   #Intermittent orthopnea, chronic issue for him. Pre-existing diastolic CHF, 2/42/6834 LVEF 60 to 65%.  Continue Lasix 20 mg daily. Recommend him to follow-up with cardiology.  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  Follow-up with ID #CKD, creatinine stable   Avoid nephrotoxin. Encourage oral hydration. #Diabetes, A1c 8.7. Both axitinib and prednisone may have exacerbated his hypoglycemia. I had a lengthy discussion with patient and advised him to strictly comply to diabetic dieting. Also see primary care provider for discussion of starting a diabetic regimen in the context of CHF and CKD.  We spent sufficient time to discuss many aspect of care, questions were answered to patient's  satisfaction. Follow-up in 3 weeks for follow-up of evaluation prior to immunotherapy. Thank you for allowing me to participate in the care of this patient.   Earlie Server, MD, PhD Hematology Oncology [facility] Pager- [phone] 02/28/2020

## 2020-02-28 NOTE — Progress Notes (Signed)
Pt received keytruda infusion in clinic today. Tolerated well. No complaints at d/c. 

## 2020-02-28 NOTE — Progress Notes (Signed)
Patient report that he had some rectal bleeding about 2 weeks ago, that lasted about 5 days. Requesting refill on Inlyta.

## 2020-03-06 ENCOUNTER — Ambulatory Visit: Payer: Self-pay | Attending: Infectious Diseases | Admitting: Infectious Diseases

## 2020-03-06 ENCOUNTER — Other Ambulatory Visit: Payer: Self-pay

## 2020-03-06 DIAGNOSIS — Z79899 Other long term (current) drug therapy: Secondary | ICD-10-CM | POA: Insufficient documentation

## 2020-03-06 DIAGNOSIS — Z7902 Long term (current) use of antithrombotics/antiplatelets: Secondary | ICD-10-CM | POA: Insufficient documentation

## 2020-03-06 DIAGNOSIS — B690 Cysticercosis of central nervous system: Secondary | ICD-10-CM | POA: Insufficient documentation

## 2020-03-06 DIAGNOSIS — R197 Diarrhea, unspecified: Secondary | ICD-10-CM | POA: Insufficient documentation

## 2020-03-06 DIAGNOSIS — I2510 Atherosclerotic heart disease of native coronary artery without angina pectoris: Secondary | ICD-10-CM | POA: Insufficient documentation

## 2020-03-06 DIAGNOSIS — N189 Chronic kidney disease, unspecified: Secondary | ICD-10-CM

## 2020-03-06 DIAGNOSIS — I10 Essential (primary) hypertension: Secondary | ICD-10-CM

## 2020-03-06 DIAGNOSIS — I130 Hypertensive heart and chronic kidney disease with heart failure and stage 1 through stage 4 chronic kidney disease, or unspecified chronic kidney disease: Secondary | ICD-10-CM | POA: Insufficient documentation

## 2020-03-06 DIAGNOSIS — Z7952 Long term (current) use of systemic steroids: Secondary | ICD-10-CM | POA: Insufficient documentation

## 2020-03-06 DIAGNOSIS — C649 Malignant neoplasm of unspecified kidney, except renal pelvis: Secondary | ICD-10-CM

## 2020-03-06 DIAGNOSIS — R739 Hyperglycemia, unspecified: Secondary | ICD-10-CM | POA: Insufficient documentation

## 2020-03-06 DIAGNOSIS — Z85528 Personal history of other malignant neoplasm of kidney: Secondary | ICD-10-CM | POA: Insufficient documentation

## 2020-03-06 DIAGNOSIS — Z905 Acquired absence of kidney: Secondary | ICD-10-CM | POA: Insufficient documentation

## 2020-03-06 DIAGNOSIS — Z7982 Long term (current) use of aspirin: Secondary | ICD-10-CM | POA: Insufficient documentation

## 2020-03-06 DIAGNOSIS — Z955 Presence of coronary angioplasty implant and graft: Secondary | ICD-10-CM | POA: Insufficient documentation

## 2020-03-06 DIAGNOSIS — N183 Chronic kidney disease, stage 3 unspecified: Secondary | ICD-10-CM | POA: Insufficient documentation

## 2020-03-06 NOTE — Patient Instructions (Signed)
You are here for follow up - you have diarrhea- will check stool PCP and cdiff PCR You will finish steorids next week. Will follow up 1 month

## 2020-03-06 NOTE — Progress Notes (Signed)
NAME: Mark Donaldson  DOB: April 19, 1960  MRN: [id number]  Date/Time: 03/06/2020 11:55 AM  Subjective:  Patient here for 2-week follow-up visit  ?[place] Spanish interpreter in the room.  Patient is accompanied by  his son The following is taken from the medical records from last visit. Mark Donaldson is a 60 y.o. 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  On a routine examination to stage his renal carcinoma he was found to have 2 cystic lesions with scolex on the right side and 2 calcified lesions on the left side.  Tinea Solum IgG sent to [facility] was positive.  Patient was started on treatment for neurocysticercosis on 01/20/2020.  He was given 2 weeks of albendazole and praziquantel which he completed on 02/04/2020. He is also on tapering dose of steroids since 01/20/2020. Patient says he had dizziness, poor appetite, difficulty in swallowing  and hair loss when he was taking the antihelminthic treatment.  He also had diarrhea with bloody stool and the blood in the stool has resolved.  But he still has frequent softer loose stools especially after eating.    He also has lost 13 pounds since 01/19/2020.   Patient is getting pembrolizumab and axitinib for his renal carcinoma.  He has had high blood glucose since November 2021.  It was noted earlier than that as well.  It was thought to be a combination of his cancer medication and now compounded by steroids. Today he is feeling better He has more energy He worked in his yard His appetite is much improved He has frequent stools He shaved his head and looks like his head is growing again For his blood sugar he has gone on a strict diet.  He also has kidney failure and hence is on low potassium diet He  has 1 more week of steroids.  His current dose is 5 mg and is tapering it down in the next week.   Past Medical History:  Diagnosis Date  . 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 CAD (coronary artery disease)   . Essential hypertension   . HFrEF (heart failure with reduced ejection fraction) ([place])   . Hyperlipidemia LDL goal <70   . Ischemic cardiomyopathy   . Renal cell carcinoma ([place]) 05/23/2019  . Renal disorder     Past Surgical History:  Procedure Laterality Date  . CARDIAC SURGERY     cardiac cath with 2 stent placement  . LAPAROSCOPIC NEPHRECTOMY, HAND ASSISTED Left 05/05/2019   Procedure: HAND ASSISTED LAPAROSCOPIC NEPHRECTOMY;  Surgeon: Hollice Espy, MD;  Location: [facility] ORS;  Service: Urology;  Laterality: Left;    Social History   Socioeconomic History  . Marital status: Married    Spouse name: Not on file  . Number of children: Not on file  . Years of education: Not on file  . Highest education level: Not on file  Occupational History  . Not on file  Tobacco Use  . Smoking status: Never Smoker  . Smokeless tobacco: Never Used  Substance and Sexual Activity  . Alcohol use: Yes  . Drug use: Not on file  . Sexual activity: Not on file  Other Topics Concern  . Not on file  Social History Narrative  . Not on file   Social Determinants of Health   Financial Resource  Strain: Not on file  Food Insecurity: Not on file  Transportation Needs: Not on file  Physical Activity: Not on file  Stress: Not on file  Social Connections: Not on file  Intimate Partner Violence: Not on file    Family History  Problem Relation Age of Onset  . Cancer Mother   . Blindness Brother    No Known Allergies  ? Current Outpatient Medications  Medication Sig Dispense Refill  . aspirin 81 MG EC tablet Take 81 mg by mouth daily. Swallow whole.    Marland Kitchen atorvastatin (LIPITOR) 80 MG tablet Take 80 mg by mouth daily.    Marland Kitchen axitinib (INLYTA) 5 MG tablet Take 1 tablet (5 mg total) by mouth  2 (two) times daily. 60 tablet 3  . clopidogrel (PLAVIX) 75 MG tablet Take 75 mg by mouth daily.    . furosemide (LASIX) 20 MG tablet Take 1 tablet (20 mg total) by mouth daily. 30 tablet 3  . KEYTRUDA 100 MG/4ML SOLN INJECT 200MG  INTRAVENOUSLY EVERY 3 WEEKS 8 mL 11  . lisinopril (ZESTRIL) 10 MG tablet Take 10 mg by mouth daily.    . metoprolol succinate (TOPROL-XL) 100 MG 24 hr tablet Take 1.5 tablets by mouth daily.    . predniSONE (DELTASONE) 1 MG tablet Take 1 tablet (1 mg total) by mouth daily with breakfast. (Patient not taking: Reported on 02/28/2020) 5 tablet 0  . predniSONE (DELTASONE) 2.5 MG tablet Take 1 tablet (2.5 mg total) by mouth daily with breakfast. (Patient not taking: Reported on 02/28/2020) 28 tablet 0  . predniSONE (DELTASONE) 20 MG tablet Take 1 tablet (20 mg total) by mouth daily with breakfast. (Patient not taking: Reported on 02/28/2020) 35 tablet 0  . predniSONE (DELTASONE) 5 MG tablet Take 1 tablet (5 mg total) by mouth daily with breakfast. (Patient not taking: Reported on 02/28/2020) 28 tablet 0  . predniSONE (DELTASONE) 50 MG tablet This is a 60 day taper for treatment of neurocysticercosis- (Patient not taking: Reported on 02/28/2020) 18 tablet 0   No current facility-administered medications for this visit.     Abtx:  Anti-infectives (From admission, onward)   None      REVIEW OF SYSTEMS:  Const: negative fever, negative chills, positive weight loss Eyes: Chronic poor vision right eye, negative eye pain ENT: negative coryza, negative sore throat Resp: negative cough, hemoptysis, has dyspnea Cards: negative for chest pain, palpitations, lower extremity edema GU: negative for frequency, dysuria and hematuria GI: See above Skin: negative for rash and pruritus Heme: negative for easy bruising and gum/nose bleeding MS: Weakness is improved Neurolo:, No dizziness or headaches Psych: negative for feelings of anxiety, depression  Endocrine: Has  diabetes Allergy/Immunology- negative for any medication or food allergies ?  Objective:  VITALS: BP 139/100, temp 97.7, Pulse 89, sats 98% Weight 151.8  PHYSICAL EXAM:  General: Alert, cooperative, no distress, appears stated age.  Head: Normocephalic, without obvious abnormality, atraumatic.  Hair loss patient has shaved his head Eyes: Conjunctivae clear, anicteric sclerae. Pupils are equal ENT Nares normal. No drainage or sinus tenderness. Lips, mucosa, and tongue normal.  No thrush Neck: Supple, symmetrical, no adenopathy, thyroid: non tender no carotid bruit and no JVD. Back: No CVA tenderness. Lungs: Clear to auscultation bilaterally. No Wheezing or Rhonchi. No rales. Heart: Regular rate and rhythm, no murmur, rub or gallop. Abdomen: Soft, non-tender,not distended. Bowel sounds normal. No masses Extremities: atraumatic, no cyanosis. No edema. No clubbing Skin: Over his hands there are multiple small injuries  sustained from trimming the bushes in his yard Lymph: Cervical, supraclavicular normal. Neurologic: Grossly non-focal Pertinent Labs Lab Results CBC    Component Value Date/Time   WBC 13.2 (H) 02/28/2020 0900   RBC 5.65 02/28/2020 0900   HGB 15.6 02/28/2020 0900   HCT 47.5 02/28/2020 0900   PLT 153 02/28/2020 0900   MCV 84.1 02/28/2020 0900   MCH 27.6 02/28/2020 0900   MCHC 32.8 02/28/2020 0900   RDW 15.9 (H) 02/28/2020 0900   LYMPHSABS 1.9 02/28/2020 0900   MONOABS 0.9 02/28/2020 0900   EOSABS 0.0 02/28/2020 0900   BASOSABS 0.0 02/28/2020 0900    CMP Latest Ref Rng & Units 02/28/2020 02/21/2020 02/07/2020  Glucose 70 - 99 mg/dL 153(H) 126(H) 193(H)  BUN 6 - 20 mg/dL 57(H) 57(H) 44(H)  Creatinine 0.61 - 1.24 mg/dL 2.05(H) 2.06(H) 1.71(H)  Sodium 135 - 145 mmol/L 135 137 134(L)  Potassium 3.5 - 5.1 mmol/L 4.9 4.7 4.5  Chloride 98 - 111 mmol/L 104 104 102  CO2 22 - 32 mmol/L 20(L) 23 22  Calcium 8.9 - 10.3 mg/dL 8.7(L) 9.8 8.7(L)  Total Protein 6.5 - 8.1 g/dL  6.3(L) 7.3 6.5  Total Bilirubin 0.3 - 1.2 mg/dL 0.9 0.8 0.5  Alkaline Phos 38 - 126 U/L 84 86 101  AST 15 - 41 U/L 27 41 33  ALT 0 - 44 U/L 32 44 30      ? Impression/Recommendation ?  ?Neurocysticercosis of the brain.  He has 4 lesions in the MRI of the brain.  2 on the right side are active cysts with scolex s seen in them the 2 on the left side are calcified.  Patient finished 2 weeks of albendazole plus praziquantel on 02/04/2020.  He experienced a few side effects including difficulty in swallowing, poor appetite, weight loss of 11 pounds, bloody stool, abdominal cramping and diarrhea.  All of the symptoms have improved since he completed the treatment.  Patient has also been on a tapering course of steroids since 01/20/2020.  Today is day 47 of prednisone and he is on a tapering dose.    He started with 60 mg and now he is on 10 mg.  The plan is to taper over 60 days in order to prevent rebound seizures.  His last last dose will be on March 4. As he is having some frequent loose stools with intermittent diarrhea will get stool for GI panel and C. difficile  After he completes corticosteroids 2 weeks from the last dose will get a cortisol level.  Renal carcinoma status post nephrectomy on axitinib and pembrolizumab.  Watch closely as both these medications have many side effects. ? _Hypertension on metoprolol, amlodipine and furosemide  CKD  CAD status post stent on Plavix  Hyperglycemia since Nov 2021- before the steroids started- could be due to cancer meds- discussed with Dr.Zhou-hemoglobin A1c is 7.7.  He will see his PCP for further management  Discussed the management in great detail with the son and the patient  __________________________________________________ Spent 30 minutes during this visit We will follow-up in 1 month  Note:  This document was prepared using Dragon voice recognition software and may include unintentional dictation errors.

## 2020-03-08 ENCOUNTER — Other Ambulatory Visit
Admission: RE | Admit: 2020-03-08 | Discharge: 2020-03-08 | Disposition: A | Discharge status: Home / Self Care | Payer: Self-pay | Source: Ambulatory Visit | Attending: Infectious Diseases | Admitting: Infectious Diseases

## 2020-03-08 DIAGNOSIS — R197 Diarrhea, unspecified: Secondary | ICD-10-CM | POA: Insufficient documentation

## 2020-03-08 LAB — GASTROINTESTINAL PANEL BY PCR, STOOL (REPLACES STOOL CULTURE)

## 2020-03-08 LAB — C DIFFICILE QUICK SCREEN W PCR REFLEX
C Diff antigen: NEGATIVE
C Diff interpretation: NOT DETECTED
C Diff toxin: NEGATIVE

## 2020-03-20 ENCOUNTER — Inpatient Hospital Stay: Payer: Self-pay

## 2020-03-20 ENCOUNTER — Inpatient Hospital Stay: Payer: Self-pay | Attending: Oncology

## 2020-03-20 ENCOUNTER — Inpatient Hospital Stay (HOSPITAL_BASED_OUTPATIENT_CLINIC_OR_DEPARTMENT_OTHER): Payer: Self-pay | Admitting: Oncology

## 2020-03-20 ENCOUNTER — Encounter: Payer: Self-pay | Admitting: Oncology

## 2020-03-20 VITALS — BP 118/96 | HR 76 | Temp 97.1°F | Resp 18 | Wt 150.3 lb

## 2020-03-20 DIAGNOSIS — Z905 Acquired absence of kidney: Secondary | ICD-10-CM | POA: Insufficient documentation

## 2020-03-20 DIAGNOSIS — I5042 Chronic combined systolic (congestive) and diastolic (congestive) heart failure: Secondary | ICD-10-CM | POA: Insufficient documentation

## 2020-03-20 DIAGNOSIS — R739 Hyperglycemia, unspecified: Secondary | ICD-10-CM

## 2020-03-20 DIAGNOSIS — N1832 Chronic kidney disease, stage 3b: Secondary | ICD-10-CM

## 2020-03-20 DIAGNOSIS — Z5111 Encounter for antineoplastic chemotherapy: Secondary | ICD-10-CM

## 2020-03-20 DIAGNOSIS — E875 Hyperkalemia: Secondary | ICD-10-CM

## 2020-03-20 DIAGNOSIS — E785 Hyperlipidemia, unspecified: Secondary | ICD-10-CM | POA: Insufficient documentation

## 2020-03-20 DIAGNOSIS — I130 Hypertensive heart and chronic kidney disease with heart failure and stage 1 through stage 4 chronic kidney disease, or unspecified chronic kidney disease: Secondary | ICD-10-CM | POA: Insufficient documentation

## 2020-03-20 DIAGNOSIS — C642 Malignant neoplasm of left kidney, except renal pelvis: Secondary | ICD-10-CM

## 2020-03-20 DIAGNOSIS — Z5112 Encounter for antineoplastic immunotherapy: Secondary | ICD-10-CM

## 2020-03-20 DIAGNOSIS — I255 Ischemic cardiomyopathy: Secondary | ICD-10-CM | POA: Insufficient documentation

## 2020-03-20 DIAGNOSIS — I2510 Atherosclerotic heart disease of native coronary artery without angina pectoris: Secondary | ICD-10-CM | POA: Insufficient documentation

## 2020-03-20 DIAGNOSIS — I10 Essential (primary) hypertension: Secondary | ICD-10-CM

## 2020-03-20 DIAGNOSIS — Z9861 Coronary angioplasty status: Secondary | ICD-10-CM | POA: Insufficient documentation

## 2020-03-20 DIAGNOSIS — Z8679 Personal history of other diseases of the circulatory system: Secondary | ICD-10-CM

## 2020-03-20 DIAGNOSIS — E1165 Type 2 diabetes mellitus with hyperglycemia: Secondary | ICD-10-CM | POA: Insufficient documentation

## 2020-03-20 DIAGNOSIS — Z7901 Long term (current) use of anticoagulants: Secondary | ICD-10-CM | POA: Insufficient documentation

## 2020-03-20 DIAGNOSIS — Z79899 Other long term (current) drug therapy: Secondary | ICD-10-CM | POA: Insufficient documentation

## 2020-03-20 LAB — CBC WITH DIFFERENTIAL/PLATELET
Abs Immature Granulocytes: 0.08 10*3/uL — ABNORMAL HIGH (ref 0.00–0.07)
Basophils Absolute: 0 10*3/uL (ref 0.0–0.1)
Basophils Relative: 0 %
Eosinophils Absolute: 0.1 10*3/uL (ref 0.0–0.5)
Eosinophils Relative: 1 %
HCT: 45 % (ref 39.0–52.0)
Hemoglobin: 14.5 g/dL (ref 13.0–17.0)
Immature Granulocytes: 1 %
Lymphocytes Relative: 23 %
Lymphs Abs: 2.1 10*3/uL (ref 0.7–4.0)
MCH: 27.7 pg (ref 26.0–34.0)
MCHC: 32.2 g/dL (ref 30.0–36.0)
MCV: 86 fL (ref 80.0–100.0)
Monocytes Absolute: 0.8 10*3/uL (ref 0.1–1.0)
Monocytes Relative: 9 %
Neutro Abs: 5.9 10*3/uL (ref 1.7–7.7)
Neutrophils Relative %: 66 %
Platelets: 145 10*3/uL — ABNORMAL LOW (ref 150–400)
RBC: 5.23 MIL/uL (ref 4.22–5.81)
RDW: 16.1 % — ABNORMAL HIGH (ref 11.5–15.5)
WBC: 9 10*3/uL (ref 4.0–10.5)
nRBC: 0 % (ref 0.0–0.2)

## 2020-03-20 LAB — COMPREHENSIVE METABOLIC PANEL
ALT: 31 U/L (ref 0–44)
AST: 34 U/L (ref 15–41)
Albumin: 3.7 g/dL (ref 3.5–5.0)
Alkaline Phosphatase: 73 U/L (ref 38–126)
Anion gap: 9 (ref 5–15)
BUN: 40 mg/dL — ABNORMAL HIGH (ref 6–20)
CO2: 22 mmol/L (ref 22–32)
Calcium: 9.1 mg/dL (ref 8.9–10.3)
Chloride: 107 mmol/L (ref 98–111)
Creatinine, Ser: 2.01 mg/dL — ABNORMAL HIGH (ref 0.61–1.24)
GFR, Estimated: 38 mL/min — ABNORMAL LOW (ref >60)
Glucose, Bld: 109 mg/dL — ABNORMAL HIGH (ref 70–99)
Potassium: 5.3 mmol/L — ABNORMAL HIGH (ref 3.5–5.1)
Sodium: 138 mmol/L (ref 135–145)
Total Bilirubin: 0.9 mg/dL (ref 0.3–1.2)
Total Protein: 7.1 g/dL (ref 6.5–8.1)

## 2020-03-20 LAB — TSH: TSH: 5.967 u[IU]/mL — ABNORMAL HIGH (ref 0.350–4.500)

## 2020-03-20 MED ORDER — SODIUM CHLORIDE 0.9 % IV SOLN
Freq: Once | INTRAVENOUS | Status: AC
  Filled 2020-03-20: qty 250

## 2020-03-20 MED ORDER — SODIUM CHLORIDE 0.9 % IV SOLN
200.0000 mg | Freq: Once | INTRAVENOUS | Status: AC
  Administered 2020-03-20: 200 mg via INTRAVENOUS
  Filled 2020-03-20: qty 8

## 2020-03-20 NOTE — Progress Notes (Signed)
Hematology/Oncology Progress Note [facility] [phone] [phone]  Patient Care Team: [facility] as PCP - General (General Practice)   Name of the patient: Mark Donaldson  [id number]  Apr 11, 1960  Date of visit: 03/20/20   INTERVAL HISTORY-  60 y.o. male presents for follow-up of renal cell carcinoma treatments. Patient has past medical history CAD status post PCI to LAD in 2018, on aspirin and Plavix, CHF, history of focal sclerosing/crescentic GN-ANCA positive and CKD stage III. He was hospitalized from 05/02/2019-05/07/2019 due to gross hematuria. CT showed a 7.4 cm left lower pole renal mass concerning for RCC.  Patient also has necrotic lymphadenopathy in the mediastinum and the right supra hilar region which were highly suspicious for metastatic disease.  Bilateral lung nodules, also concerning for metastatic lung disease.  Right retrocrural lymphadenopathy with upper normal left periaortic lymph node. Patient was recommended for cytoreductive radical nephrectomy.  With history of ANCA positive vasculitis, also recommend thoracic lymphadenopathy biopsy to confirm distal metastasis.  Patient agreed to radical nephrectomy and declined bronchoscopy biopsy. He underwent left radical nephrectomy on 05/05/2019. Pathology showed pT3a pNx, RCC, conventional clear cell type, grade 3 Patient was discharged home. Today he presents to discuss pathology and the management plan. He was accompanied by his wife and daughter. Patient reports that he uses hydrocodone for pain.  Pain around the surgical site has improved.  He occasionally has tingling sensation around the surgical site.  Denies any fever or chills, drainage from the surgical site.  # Interval CT chest abdomen pelvis was independently reviewed by me and discussed with patient. 01/12/2020, CT showed overall marked improvement.  Comparing to CT scan in August 2021,  continues to have treatment response.Mediastinal soft tissue nodule nearly completely resolved, right minor fissure nodule 8 mm, decreased in size.  Resolution of pleural fluid and thickening of the right chest.  Small pleural effusion in the left chest.  No new lung nodules.-Subtle variation of the muscle architecture of the left psoas muscle of unknown significance.-Continue observation.  He is asymptomatic. Subtle nodule in the soft tissue of the left flank.  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 He finished 14 days of albendazole plus praziquantel .   He has had few side effects including poor appetite, hair loss, weight loss, bloody stool, abdominal cramping or diarrhea. All symptoms have improved since he completed the treatment. 03/16/2020 he finished tapering course of steroids  # It has been noted that his glucose level has been elevated in December 2021, even before he was started on prednisone treatments. Dr. Steva Ready has obtained A1c which came back elevated at 8.7.  INTERVAL HISTORY Mark Donaldson is a 60 y.o. male who has above history reviewed by me today presents for follow up visit for management of metastatic RCC Problems and complaints are listed below: Patient is Spanish-speaking.  Spanish interpreter present for the entire encounter for translation Patient reports doing well and he is accompanied by his son today. He reports feeling tired today. Appetite has decreased.  He finished tapering course of steroid. Dr.Ravishanka planned to obtain cortisol level 2 weeks from finish of steroid.  He takes axitinib 5 mg twice daily.  Review of systems- Review of Systems  Constitutional: Negative for appetite change, chills, fatigue, fever and unexpected weight change.  HENT:   Negative for voice change.   Eyes: Negative for eye problems and icterus.  Respiratory: Negative for chest tightness and cough.  Occasionally SOB at night  Cardiovascular:  Negative for chest pain and leg swelling.  Gastrointestinal: Negative for abdominal distention and abdominal pain.  Endocrine: Negative for hot flashes.  Genitourinary: Negative for difficulty urinating, dysuria and frequency.   Musculoskeletal: Negative for arthralgias.  Skin: Negative for itching and rash.       Hair loss  Neurological: Negative for light-headedness and numbness.  Hematological: Negative for adenopathy. Does not bruise/bleed easily.  Psychiatric/Behavioral: Negative for confusion.    No Known Allergies  Patient Active Problem List   Diagnosis Date Noted  . Hyperglycemia 02/28/2020  . Stage 3b chronic kidney disease ([place]) 10/04/2019  . Brain lesion 10/04/2019  . Encounter for antineoplastic immunotherapy 07/19/2019  . Encounter for antineoplastic chemotherapy 07/19/2019  . CNS lesion 07/07/2019  . Anemia in stage 3b chronic kidney disease ([place]) 06/07/2019  . Goals of care, counseling/discussion 05/23/2019  . Renal cell carcinoma of left kidney ([place]) 05/23/2019  . Renal cell carcinoma ([place]) 05/23/2019  . Palliative care encounter   . History of vasculitis   . Thoracic lymphadenopathy   . Lung nodule   . CAD S/P percutaneous coronary angioplasty 05/02/2019  . Kidney mass 05/02/2019  . Hydronephrosis of left kidney 05/02/2019  . UTI (urinary tract infection) 05/02/2019  . AKI (acute kidney injury) ([place]) 05/02/2019  . Gross hematuria 05/02/2019  . Chronic diastolic CHF (congestive heart failure) ([place]) 05/02/2019  . Sepsis ([place]) 05/02/2019  . Severe sepsis ([place]) 05/02/2019  . Preop cardiovascular exam   . Hyperlipidemia 05/21/2016  . 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 Prediabetes 05/21/2016  . Alcohol abuse 05/18/2016  . Essential hypertension 05/18/2016  . Vasculitis, 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[place]) 05/18/2016  . Shortness of breath 05/17/2016     Past Medical History:  Diagnosis Date  . ANCA-positive vasculitis ([place])   . CAD  (coronary artery disease)   . Essential hypertension   . HFrEF (heart failure with reduced ejection fraction) ([place])   . Hyperlipidemia LDL goal <70   . Ischemic cardiomyopathy   . Renal cell carcinoma ([place]) 05/23/2019  . Renal disorder      Past Surgical History:  Procedure Laterality Date  . CARDIAC SURGERY     cardiac cath with 2 stent placement  . LAPAROSCOPIC NEPHRECTOMY, HAND ASSISTED Left 05/05/2019   Procedure: HAND ASSISTED LAPAROSCOPIC NEPHRECTOMY;  Surgeon: Hollice Espy, MD;  Location: [facility] ORS;  Service: Urology;  Laterality: Left;    Social History   Socioeconomic History  . Marital status: Married    Spouse name: Not on file  . Number of children: Not on file  . Years of education: Not on file  . Highest education level: Not on file  Occupational History  . Not on file  Tobacco Use  . Smoking status: Never Smoker  . Smokeless tobacco: Never Used  Substance and Sexual Activity  . Alcohol use: Yes  . Drug use: Not on file  . Sexual activity: Not on file  Other Topics Concern  . Not on file  Social History Narrative  . Not on file   Social Determinants of Health   Financial Resource Strain: Not on file  Food Insecurity: Not on file  Transportation Needs: Not on file  Physical Activity: Not on file  Stress: Not on file  Social Connections: Not on file  Intimate Partner Violence: Not on file     Family History  Problem Relation Age of Onset  . Cancer Mother   . Blindness Brother  Current Outpatient Medications:  .  aspirin 81 MG EC tablet, Take 81 mg by mouth daily. Swallow whole., Disp: , Rfl:  .  atorvastatin (LIPITOR) 80 MG tablet, Take 80 mg by mouth daily., Disp: , Rfl:  .  axitinib (INLYTA) 5 MG tablet, Take 1 tablet (5 mg total) by mouth 2 (two) times daily., Disp: 60 tablet, Rfl: 3 .  clopidogrel (PLAVIX) 75 MG tablet, Take 75 mg by mouth daily., Disp: , Rfl:  .  furosemide (LASIX) 20 MG tablet, Take 1 tablet (20 mg total) by mouth  daily., Disp: 30 tablet, Rfl: 3 .  KEYTRUDA 100 MG/4ML SOLN, INJECT 200MG  INTRAVENOUSLY EVERY 3 WEEKS, Disp: 8 mL, Rfl: 11 .  lisinopril (ZESTRIL) 10 MG tablet, Take 10 mg by mouth daily., Disp: , Rfl:  .  metoprolol succinate (TOPROL-XL) 100 MG 24 hr tablet, Take 1.5 tablets by mouth daily., Disp: , Rfl:  .  predniSONE (DELTASONE) 1 MG tablet, Take 1 tablet (1 mg total) by mouth daily with breakfast. (Patient not taking: Reported on 02/28/2020), Disp: 5 tablet, Rfl: 0 .  predniSONE (DELTASONE) 2.5 MG tablet, Take 1 tablet (2.5 mg total) by mouth daily with breakfast. (Patient not taking: Reported on 02/28/2020), Disp: 28 tablet, Rfl: 0 .  predniSONE (DELTASONE) 20 MG tablet, Take 1 tablet (20 mg total) by mouth daily with breakfast. (Patient not taking: Reported on 02/28/2020), Disp: 35 tablet, Rfl: 0 .  predniSONE (DELTASONE) 5 MG tablet, Take 1 tablet (5 mg total) by mouth daily with breakfast. (Patient not taking: Reported on 02/28/2020), Disp: 28 tablet, Rfl: 0 .  predniSONE (DELTASONE) 50 MG tablet, This is a 60 day taper for treatment of neurocysticercosis- (Patient not taking: Reported on 02/28/2020), Disp: 18 tablet, Rfl: 0   Physical exam:  Vitals:   03/20/20 0918  BP: (!) 118/96  Pulse: 76  Resp: 18  Temp: (!) 97.1 F (36.2 C)  Weight: 150 lb 4.8 oz (68.2 kg)   Physical Exam Constitutional:      General: He is not in acute distress.    Appearance: He is not diaphoretic.  HENT:     Head: Normocephalic and atraumatic.     Nose: Nose normal.     Mouth/Throat:     Pharynx: No oropharyngeal exudate.  Eyes:     General: No scleral icterus.    Pupils: Pupils are equal, round, and reactive to light.  Cardiovascular:     Rate and Rhythm: Normal rate and regular rhythm.     Heart sounds: No murmur heard.   Pulmonary:     Effort: Pulmonary effort is normal. No respiratory distress.     Breath sounds: No rales.  Chest:     Chest wall: No tenderness.  Abdominal:     General:  There is no distension.     Palpations: Abdomen is soft.     Tenderness: There is no abdominal tenderness.  Musculoskeletal:        General: Normal range of motion.     Cervical back: Normal range of motion and neck supple.  Skin:    General: Skin is warm and dry.     Findings: No erythema.  Neurological:     Mental Status: He is alert and oriented to person, place, and time.     Cranial Nerves: No cranial nerve deficit.     Motor: No abnormal muscle tone.     Coordination: Coordination normal.  Psychiatric:        Mood and Affect:  Affect normal.        CMP Latest Ref Rng & Units 02/28/2020  Glucose 70 - 99 mg/dL 153(H)  BUN 6 - 20 mg/dL 57(H)  Creatinine 0.61 - 1.24 mg/dL 2.05(H)  Sodium 135 - 145 mmol/L 135  Potassium 3.5 - 5.1 mmol/L 4.9  Chloride 98 - 111 mmol/L 104  CO2 22 - 32 mmol/L 20(L)  Calcium 8.9 - 10.3 mg/dL 8.7(L)  Total Protein 6.5 - 8.1 g/dL 6.3(L)  Total Bilirubin 0.3 - 1.2 mg/dL 0.9  Alkaline Phos 38 - 126 U/L 84  AST 15 - 41 U/L 27  ALT 0 - 44 U/L 32   CBC Latest Ref Rng & Units 02/28/2020  WBC 4.0 - 10.5 K/uL 13.2(H)  Hemoglobin 13.0 - 17.0 g/dL 15.6  Hematocrit 39.0 - 52.0 % 47.5  Platelets 150 - 400 K/uL 153    RADIOGRAPHIC STUDIES: I have personally reviewed the radiological images as listed and agreed with the findings in the report. CT CHEST ABDOMEN PELVIS WO CONTRAST  Addendum Date: 01/13/2020   ADDENDUM REPORT: 01/13/2020 11:45 ADDENDUM: There is no sign of aortic atherosclerosis on the current study. There is evidence of coronary artery disease. Please disregard the RAF code that was previously provided. Electronically Signed   By: Zetta Bills M.D.   On: 01/13/2020 11:45   Result Date: 01/13/2020 CLINICAL DATA:  History of renal cell carcinoma post LEFT nephrectomy in April of 2021. Ongoing therapy by report. EXAM: CT CHEST, ABDOMEN AND PELVIS WITHOUT CONTRAST TECHNIQUE: Multidetector CT imaging of the chest, abdomen and pelvis was  performed following the standard protocol without IV contrast. COMPARISON:  CT evaluation from May 03, 2019. FINDINGS: CT CHEST FINDINGS Cardiovascular: Noncontrast appearance of the heart great vessels is remarkable only for coronary artery calcification of LEFT coronary circulation and signs of prior percutaneous coronary intervention. Small calcification also in RIGHT coronary circulation along the RIGHT heart border. No pericardial effusion. Normal caliber thoracic aorta. Normal caliber central pulmonary vessels. Limited assessment of cardiovascular structures given lack of intravenous contrast. A Mediastinum/Nodes: Thoracic inlet structures are normal. No axillary lymphadenopathy. No mediastinal lymphadenopathy. No gross hilar adenopathy. Enhancing paramediastinal soft tissue nodule seen on the previous exam have nearly completely resolved. For instance, a small soft tissue nodule along the RIGHT anterior mediastinal margin (image 19, series 2) 4-5 mm previously in the range of 15 mm (on the April evaluation. As compared to the PET exam this area previously measured approximately 13 mm. Other areas which were larger than this focus on the prior study of April of 2021 are no longer evident. Lungs/Pleura: Nodule along the minor fissure in the RIGHT chest (image 69, series 5) 8 mm, previous 18 mm on the April study and 11 mm on the most recent comparison imaging, PET evaluation from August of 2021. Resolution of pleural fluid and thickening in the RIGHT chest and small pleural effusion in the LEFT chest since the prior study. No new pulmonary nodule. Nodules deep within the pleural space along the RIGHT hemidiaphragm and retrocrural implants seen on the previous study from April have resolved as on the previous PET evaluation. Musculoskeletal: See below for full musculoskeletal details. CT ABDOMEN PELVIS FINDINGS Hepatobiliary: Normal hepatic contour. No focal lesion on noncontrast imaging. No pericholecystic  stranding. Pancreas: Normal contour no sign of inflammation. Spleen: Normal, posteriorly oriented following LEFT nephrectomy. Adrenals/Urinary Tract: Adrenal glands are normal. No RIGHT-sided hydronephrosis or nephrolithiasis. Post LEFT nephrectomy as before. Suture line along the LEFT renal vasculature shows  a similar appearance and shows no sign of increasing nodularity or soft tissue thickening. Very subtle variation in LEFT psoas musculature while nonspecific and potentially artifact related, also suggested however on coronal images, very subtle change in muscular structure and asymmetry and may measure as much as 2 x 1.5 cm. Stomach/Bowel: Scattered colonic diverticulosis.  Normal appendix. Stomach distended with ingested contrast material. No small bowel dilation or signs of small bowel inflammation. Vascular/Lymphatic: Normal caliber abdominal aorta. There is no gastrohepatic or hepatoduodenal ligament lymphadenopathy. No retroperitoneal or mesenteric lymphadenopathy. The No pelvic sidewall lymphadenopathy. Reproductive: Prostate mildly enlarged. Other: Subtle nodularity in the soft tissues of the LEFT flank (image 76 of series 2 measuring approximately 7 mm. No ascites. No peritoneal nodularity. Musculoskeletal: Spinal degenerative changes. No acute or destructive bone process. 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 IMPRESSION: 1. Overall marked improvement with respect to disease in the chest, abdomen and pelvis when compared to the study of April 2021 and continued improvement when compared to the exam from August of 2021. 2. Very subtle variation in muscular architecture of the LEFT psoas muscle is of uncertain significance and potentially artifact related. However, given the potential for intramuscular metastatic lesions in the setting of renal cell carcinoma with consider short interval follow-up assessment with MRI. Subsequent follow-up imaging could also  be performed with MRI of the abdomen for follow-up in this high-risk patient with additional coverage provided with noncontrast CT as warranted. 3. Subtle nodularity in the soft tissues of the LEFT flank measuring approximately 7 mm. This could represent an injection site or area of contusion with given focal appearance would suggest attention on subsequent imaging. 4. 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 5. Post LEFT nephrectomy. 6. Coronary artery calcification and signs of prior percutaneous coronary intervention. 7. Aortic atherosclerosis. Aortic Atherosclerosis (ICD10-I70.0). Electronically Signed: By: Zetta Bills M.D. On: 01/12/2020 11:51    Assessment and plan-  Patient is a 60 y.o. male history of CAD, status post stent, CHF, history of ANCA positive sclerosing/crescentic GN, chronic kidney disease stage III presents for treatments of stage IV renal cell carcinoma 1. Renal cell carcinoma of left kidney (HCC)   2. Encounter for antineoplastic chemotherapy   3. Encounter for antineoplastic immunotherapy   4. Hyperglycemia   5. History of CHF (congestive heart failure)   6. Primary hypertension   7. Stage 3b chronic kidney disease ([place])   8. Hyperkalemia   Cancer Staging Renal cell carcinoma of left kidney (HCC) Staging form: Kidney, AJCC 8th Edition - Pathologic: Stage IV (pT3a, pNX, cM1) - Signed by Earlie Server, MD on 05/23/2019   # Stage IV renal cell carcinoma Labs are reviewed and discussed with patient. Proceed with Keytruda today.  Continue axitinib 5 mg twice daily Obtain CT chest abdomen pelvis wo contrast after next treatment .   #Hypertension, CHF Continue metoprolol, lisinopril. continue Lasix 20 mg daily. Recommend patient to follow-up with primary care provider.   #Intermittent orthopnea, chronic issue for him. Pre-existing diastolic CHF, 3/61/4431 LVEF 60 to 65%.  Continue Lasix 20 mg daily. Recommend him to follow-up  with cardiology.  #Brain lesion  neurocysticercosis finished therapy.  Follow-up with ID # Fatigue, possible due to recent tapering of steroid. ID plans to check cortisol level next week.   #CKD, creatinine stable  Avoid nephrotoxin. Encourage oral hydration.stbale Cr # Hyperkalemia follows up with Dr. Holley Raring. Will forward result to him. Advise avoid  food with high content of K.  #Diabetes, A1c 8.7. Both axitinib and prednisone may have exacerbated his hypoglycemia. Glucose level has improved since he finished the steroid taper. He has seen PCP and diet control was recommended.   We spent sufficient time to discuss many aspect of care, questions were answered to patient's satisfaction. Follow-up in 3 weeks for follow-up of evaluation prior to immunotherapy. Thank you for allowing me to participate in the care of this patient.   Earlie Server, MD, PhD Hematology Oncology [facility] Pager- [phone] 03/20/2020

## 2020-03-20 NOTE — Progress Notes (Signed)
Patient here for follow up. Pt reports he doesn't have very much appetite. Shortly after eating, he needs to use the bathroom and has loose stools.

## 2020-04-02 ENCOUNTER — Other Ambulatory Visit: Payer: Self-pay

## 2020-04-02 ENCOUNTER — Other Ambulatory Visit
Admission: RE | Admit: 2020-04-02 | Discharge: 2020-04-02 | Disposition: A | Discharge status: Home / Self Care | Payer: Self-pay | Attending: Infectious Diseases | Admitting: Infectious Diseases

## 2020-04-02 DIAGNOSIS — B690 Cysticercosis of central nervous system: Secondary | ICD-10-CM | POA: Insufficient documentation

## 2020-04-02 LAB — CORTISOL: Cortisol, Plasma: 16 ug/dL

## 2020-04-03 ENCOUNTER — Ambulatory Visit: Payer: Self-pay | Attending: Infectious Diseases | Admitting: Infectious Diseases

## 2020-04-03 VITALS — BP 80/57 | HR 65 | Temp 98.8°F | Wt 155.0 lb

## 2020-04-03 DIAGNOSIS — I10 Essential (primary) hypertension: Secondary | ICD-10-CM

## 2020-04-03 DIAGNOSIS — Z7982 Long term (current) use of aspirin: Secondary | ICD-10-CM | POA: Insufficient documentation

## 2020-04-03 DIAGNOSIS — E785 Hyperlipidemia, unspecified: Secondary | ICD-10-CM | POA: Insufficient documentation

## 2020-04-03 DIAGNOSIS — I255 Ischemic cardiomyopathy: Secondary | ICD-10-CM | POA: Insufficient documentation

## 2020-04-03 DIAGNOSIS — E1165 Type 2 diabetes mellitus with hyperglycemia: Secondary | ICD-10-CM | POA: Insufficient documentation

## 2020-04-03 DIAGNOSIS — R197 Diarrhea, unspecified: Secondary | ICD-10-CM

## 2020-04-03 DIAGNOSIS — Z79899 Other long term (current) drug therapy: Secondary | ICD-10-CM | POA: Insufficient documentation

## 2020-04-03 DIAGNOSIS — R739 Hyperglycemia, unspecified: Secondary | ICD-10-CM

## 2020-04-03 DIAGNOSIS — B690 Cysticercosis of central nervous system: Secondary | ICD-10-CM | POA: Insufficient documentation

## 2020-04-03 DIAGNOSIS — Z85528 Personal history of other malignant neoplasm of kidney: Secondary | ICD-10-CM | POA: Insufficient documentation

## 2020-04-03 DIAGNOSIS — Z955 Presence of coronary angioplasty implant and graft: Secondary | ICD-10-CM | POA: Insufficient documentation

## 2020-04-03 DIAGNOSIS — I130 Hypertensive heart and chronic kidney disease with heart failure and stage 1 through stage 4 chronic kidney disease, or unspecified chronic kidney disease: Secondary | ICD-10-CM | POA: Insufficient documentation

## 2020-04-03 DIAGNOSIS — Z7902 Long term (current) use of antithrombotics/antiplatelets: Secondary | ICD-10-CM | POA: Insufficient documentation

## 2020-04-03 DIAGNOSIS — N189 Chronic kidney disease, unspecified: Secondary | ICD-10-CM | POA: Insufficient documentation

## 2020-04-03 DIAGNOSIS — N186 End stage renal disease: Secondary | ICD-10-CM

## 2020-04-03 DIAGNOSIS — Z905 Acquired absence of kidney: Secondary | ICD-10-CM | POA: Insufficient documentation

## 2020-04-03 DIAGNOSIS — I2510 Atherosclerotic heart disease of native coronary artery without angina pectoris: Secondary | ICD-10-CM | POA: Insufficient documentation

## 2020-04-03 NOTE — Progress Notes (Signed)
NAME: Mark Donaldson  DOB: 16-Jun-1960  MRN: [id number]  Date/Time: 04/03/2020 11:19 AM  Subjective:  Patient here  follow-up visit. Spanish interpretor in the visit Last seen on  03/06/20 Since then has completed neurocysticercosis treatment and also finished steroids which he tapered over a period of 2 months- and completed on 03/16/20. A cortisol level was checked 17 days after steroid completion and it was normal at 16 Pt has had diarrhea for a few months- this has improved a lot. On 03/08/20 GI PCR panel and cdiff were negative Blood sugar is doing better Had one episode of rt occipital headache yesterday ?The following is taken from the medical records from last visit. Mark Donaldson is a 60 y.o. 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  On a routine examination to stage his renal carcinoma he was found to have 2 cystic lesions with scolex on the right side and 2 calcified lesions on the left side.  Tinea Solum IgG sent to [facility] was positive.  Patient was started on treatment for neurocysticercosis on 01/20/2020.  He was given 2 weeks of albendazole and praziquantel which he completed on 02/04/2020.He received high dose steroids which was tapered over a 2 month period on 03/16/20   Past Medical History:  Diagnosis Date  . 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 CAD (coronary artery disease)   . Essential hypertension   . HFrEF (heart failure with reduced ejection fraction) ([place])   . Hyperlipidemia LDL goal <70   . Ischemic cardiomyopathy   . Renal cell carcinoma ([place]) 05/23/2019  . Renal disorder     Past Surgical History:  Procedure Laterality Date  . CARDIAC SURGERY     cardiac cath with 2 stent placement  . LAPAROSCOPIC  NEPHRECTOMY, HAND ASSISTED Left 05/05/2019   Procedure: HAND ASSISTED LAPAROSCOPIC NEPHRECTOMY;  Surgeon: Hollice Espy, MD;  Location: [facility] ORS;  Service: Urology;  Laterality: Left;    Social History   Socioeconomic History  . Marital status: Married    Spouse name: Not on file  . Number of children: Not on file  . Years of education: Not on file  . Highest education level: Not on file  Occupational History  . Not on file  Tobacco Use  . Smoking status: Never Smoker  . Smokeless tobacco: Never Used  Substance and Sexual Activity  . Alcohol use: Yes  . Drug use: Not on file  . Sexual activity: Not on file  Other Topics Concern  . Not on file  Social History Narrative  . Not on file   Social Determinants of Health   Financial Resource Strain: Not on file  Food Insecurity: Not on file  Transportation Needs: Not on file  Physical Activity: Not on file  Stress: Not on file  Social Connections: Not on file  Intimate Partner Violence: Not on file    Family History  Problem Relation Age of Onset  . Cancer Mother   . Blindness Brother    No Known Allergies  ? Current Outpatient Medications  Medication Sig Dispense Refill  . aspirin 81 MG EC tablet Take 81 mg by mouth daily. Swallow whole.    Marland Kitchen atorvastatin (LIPITOR) 80 MG tablet Take 80 mg by mouth daily.    Marland Kitchen axitinib (INLYTA) 5 MG tablet Take 1 tablet (  5 mg total) by mouth 2 (two) times daily. 60 tablet 3  . clopidogrel (PLAVIX) 75 MG tablet Take 75 mg by mouth daily.    . furosemide (LASIX) 20 MG tablet Take 1 tablet (20 mg total) by mouth daily. 30 tablet 3  . lisinopril (ZESTRIL) 10 MG tablet Take 10 mg by mouth daily.    Marland Kitchen KEYTRUDA 100 MG/4ML SOLN INJECT 200MG  INTRAVENOUSLY EVERY 3 WEEKS (Patient not taking: Reported on 04/03/2020) 8 mL 11  . metoprolol succinate (TOPROL-XL) 100 MG 24 hr tablet Take 1.5 tablets by mouth daily.    . predniSONE (DELTASONE) 1 MG tablet Take 1 tablet (1 mg total) by mouth daily with  breakfast. (Patient not taking: Reported on 04/03/2020) 5 tablet 0  . predniSONE (DELTASONE) 2.5 MG tablet Take 1 tablet (2.5 mg total) by mouth daily with breakfast. (Patient not taking: Reported on 04/03/2020) 28 tablet 0  . predniSONE (DELTASONE) 20 MG tablet Take 1 tablet (20 mg total) by mouth daily with breakfast. 35 tablet 0  . predniSONE (DELTASONE) 5 MG tablet Take 1 tablet (5 mg total) by mouth daily with breakfast. 28 tablet 0  . predniSONE (DELTASONE) 50 MG tablet This is a 60 day taper for treatment of neurocysticercosis- 18 tablet 0   No current facility-administered medications for this visit.     Abtx:  Anti-infectives (From admission, onward)   None      REVIEW OF SYSTEMS:  Const: negative fever, negative chills, has gained 4 pounds since last month Eyes: Chronic poor vision right eye, negative eye pain ENT: negative coryza, negative sore throat Resp: negative cough, hemoptysis, has dyspnea Cards: negative for chest pain, palpitations, lower extremity edema GU: negative for frequency, dysuria and hematuria GI: See above Skin: negative for rash and pruritus Heme: negative for easy bruising and gum/nose bleeding MS: Weakness is improved Neurolo:,  headaches Psych: negative for feelings of anxiety, depression  Endocrine: Has diabetes Allergy/Immunology- negative for any medication or food allergies ?  Objective BP (!) 80/57   Pulse 65   Temp 98.8 F (37.1 C) (Oral)   Wt 155 lb (70.3 kg)   BMI 27.46 kg/m   PHYSICAL EXAM:  General: Alert, cooperative, no distress, appears stated age.  Head: Normocephalic, without obvious abnormality, atraumatic.  Hair loss patient has shaved his head Eyes: Conjunctivae clear, anicteric sclerae. Pupils are equal ENT Nares normal. No drainage or sinus tenderness. Lips, mucosa, and tongue normal.  No thrush Neck: Supple, symmetrical, no adenopathy, thyroid: non tender no carotid bruit and no JVD. Back: No CVA  tenderness. Lungs: Clear to auscultation bilaterally. No Wheezing or Rhonchi. No rales. Heart: Regular rate and rhythm, no murmur, rub or gallop. Abdomen: Soft, non-tender,not distended. Bowel sounds normal. No masses Extremities: atraumatic, no cyanosis. No edema. No clubbing Skin:normal Lymph: Cervical, supraclavicular normal. Neurologic: Grossly non-focal Pertinent Labs Lab Results CBC    Component Value Date/Time   WBC 9.0 03/20/2020 0850   RBC 5.23 03/20/2020 0850   HGB 14.5 03/20/2020 0850   HCT 45.0 03/20/2020 0850   PLT 145 (L) 03/20/2020 0850   MCV 86.0 03/20/2020 0850   MCH 27.7 03/20/2020 0850   MCHC 32.2 03/20/2020 0850   RDW 16.1 (H) 03/20/2020 0850   LYMPHSABS 2.1 03/20/2020 0850   MONOABS 0.8 03/20/2020 0850   EOSABS 0.1 03/20/2020 0850   BASOSABS 0.0 03/20/2020 0850    CMP Latest Ref Rng & Units 03/20/2020 02/28/2020 02/21/2020  Glucose 70 - 99 mg/dL 109(H) 153(H) 126(H)  BUN 6 - 20 mg/dL 40(H) 57(H) 57(H)  Creatinine 0.61 - 1.24 mg/dL 2.01(H) 2.05(H) 2.06(H)  Sodium 135 - 145 mmol/L 138 135 137  Potassium 3.5 - 5.1 mmol/L 5.3(H) 4.9 4.7  Chloride 98 - 111 mmol/L 107 104 104  CO2 22 - 32 mmol/L 22 20(L) 23  Calcium 8.9 - 10.3 mg/dL 9.1 8.7(L) 9.8  Total Protein 6.5 - 8.1 g/dL 7.1 6.3(L) 7.3  Total Bilirubin 0.3 - 1.2 mg/dL 0.9 0.9 0.8  Alkaline Phos 38 - 126 U/L 73 84 86  AST 15 - 41 U/L 34 27 41  ALT 0 - 44 U/L 31 32 44      ? Impression/Recommendation ?  ?Neurocysticercosis of the brain. Completed 2 weeks of albendazole and praziquantel and a 8 week steroid taper Had many side effects following this regimen  diarrhea- GI panel and cdiff neg- some improvement Will repeat MRI in July to see the improvement l. Hypertension on metoprolol, amlodipine and furosemide, but today BP 80/57. Told him to stop frusemide and follow with PCP. If bP continues to be low will then need to hold lisinopril Left a message for his PCP at [facility]  Diarrhea- much  improved- neg GI and Cdiff panel  Renal carcinoma status post nephrectomy on axitinib and pembrolizumab.  Watch closely as both these medications have many side effects. ? _  CKD  CAD status post stent on Plavix  Hyperglycemia since Nov 2021-much better    Discussed the management in great detail with patient- will see him 3-4 months _ Note:  This document was prepared using Dragon voice recognition software and may include unintentional dictation errors.

## 2020-04-03 NOTE — Patient Instructions (Addendum)
You are here for follow up after treating for neurocysticercosis- you had diarrhea and we did test which was completley normal for 21 differne bacteria, virus and parasites. Your cortisol level done on 04/02/20  is normal at 16 after completing a 2 month tapering steroid on 03/16/20 Your BP is low today-80/57 you are on frusemide 20mg  , lisinopril 10mg  and metoprolol 100mg  Stop frusemide 20mg . Check BP in 2 days and if still low lisinopril 10mg  should be on hold- You need to talk to your PCP ASAP. Will follow up in 4 months after getting MRI of the head

## 2020-04-10 ENCOUNTER — Inpatient Hospital Stay: Payer: Self-pay

## 2020-04-10 ENCOUNTER — Encounter: Payer: Self-pay | Admitting: Oncology

## 2020-04-10 ENCOUNTER — Inpatient Hospital Stay (HOSPITAL_BASED_OUTPATIENT_CLINIC_OR_DEPARTMENT_OTHER): Payer: Self-pay | Admitting: Oncology

## 2020-04-10 VITALS — BP 130/102 | HR 76 | Temp 97.5°F | Resp 18 | Wt 153.0 lb

## 2020-04-10 DIAGNOSIS — G939 Disorder of brain, unspecified: Secondary | ICD-10-CM

## 2020-04-10 DIAGNOSIS — Z5112 Encounter for antineoplastic immunotherapy: Secondary | ICD-10-CM

## 2020-04-10 DIAGNOSIS — Z8679 Personal history of other diseases of the circulatory system: Secondary | ICD-10-CM

## 2020-04-10 DIAGNOSIS — C642 Malignant neoplasm of left kidney, except renal pelvis: Secondary | ICD-10-CM

## 2020-04-10 DIAGNOSIS — Z5111 Encounter for antineoplastic chemotherapy: Secondary | ICD-10-CM

## 2020-04-10 LAB — CBC WITH DIFFERENTIAL/PLATELET
Abs Immature Granulocytes: 0.03 10*3/uL (ref 0.00–0.07)
Basophils Absolute: 0.1 10*3/uL (ref 0.0–0.1)
Basophils Relative: 1 %
Eosinophils Absolute: 0.3 10*3/uL (ref 0.0–0.5)
Eosinophils Relative: 3 %
HCT: 43.2 % (ref 39.0–52.0)
Hemoglobin: 14.4 g/dL (ref 13.0–17.0)
Immature Granulocytes: 0 %
Lymphocytes Relative: 19 %
Lymphs Abs: 2.1 10*3/uL (ref 0.7–4.0)
MCH: 28.4 pg (ref 26.0–34.0)
MCHC: 33.3 g/dL (ref 30.0–36.0)
MCV: 85.2 fL (ref 80.0–100.0)
Monocytes Absolute: 1.3 10*3/uL — ABNORMAL HIGH (ref 0.1–1.0)
Monocytes Relative: 12 %
Neutro Abs: 7.3 10*3/uL (ref 1.7–7.7)
Neutrophils Relative %: 65 %
Platelets: 184 10*3/uL (ref 150–400)
RBC: 5.07 MIL/uL (ref 4.22–5.81)
RDW: 16.5 % — ABNORMAL HIGH (ref 11.5–15.5)
WBC: 11.1 10*3/uL — ABNORMAL HIGH (ref 4.0–10.5)
nRBC: 0 % (ref 0.0–0.2)

## 2020-04-10 LAB — COMPREHENSIVE METABOLIC PANEL
ALT: 22 U/L (ref 0–44)
AST: 33 U/L (ref 15–41)
Albumin: 4.3 g/dL (ref 3.5–5.0)
Alkaline Phosphatase: 77 U/L (ref 38–126)
Anion gap: 12 (ref 5–15)
BUN: 23 mg/dL — ABNORMAL HIGH (ref 6–20)
CO2: 23 mmol/L (ref 22–32)
Calcium: 9.2 mg/dL (ref 8.9–10.3)
Chloride: 104 mmol/L (ref 98–111)
Creatinine, Ser: 1.77 mg/dL — ABNORMAL HIGH (ref 0.61–1.24)
GFR, Estimated: 44 mL/min — ABNORMAL LOW (ref >60)
Glucose, Bld: 107 mg/dL — ABNORMAL HIGH (ref 70–99)
Potassium: 4.7 mmol/L (ref 3.5–5.1)
Sodium: 139 mmol/L (ref 135–145)
Total Bilirubin: 1.1 mg/dL (ref 0.3–1.2)
Total Protein: 7 g/dL (ref 6.5–8.1)

## 2020-04-10 MED ORDER — SODIUM CHLORIDE 0.9 % IV SOLN
Freq: Once | INTRAVENOUS | Status: AC
  Filled 2020-04-10: qty 250

## 2020-04-10 MED ORDER — HEPARIN SOD (PORK) LOCK FLUSH 100 UNIT/ML IV SOLN
INTRAVENOUS | Status: AC
  Filled 2020-04-10: qty 5

## 2020-04-10 MED ORDER — SODIUM CHLORIDE 0.9 % IV SOLN
200.0000 mg | Freq: Once | INTRAVENOUS | Status: AC
  Administered 2020-04-10: 200 mg via INTRAVENOUS
  Filled 2020-04-10: qty 8

## 2020-04-10 NOTE — Progress Notes (Signed)
Hematology/Oncology Progress Note [facility] [phone] [phone]  Patient Care Team: [facility] as PCP - General (General Practice)   Name of the patient: Mark Donaldson  [id number]  April 15, 1960  Date of visit: 04/10/20   INTERVAL HISTORY-  60 y.o. male presents for follow-up of renal cell carcinoma treatments. Patient has past medical history CAD status post PCI to LAD in 2018, on aspirin and Plavix, CHF, history of focal sclerosing/crescentic GN-ANCA positive and CKD stage III. He was hospitalized from 05/02/2019-05/07/2019 due to gross hematuria. CT showed a 7.4 cm left lower pole renal mass concerning for RCC.  Patient also has necrotic lymphadenopathy in the mediastinum and the right supra hilar region which were highly suspicious for metastatic disease.  Bilateral lung nodules, also concerning for metastatic lung disease.  Right retrocrural lymphadenopathy with upper normal left periaortic lymph node. Patient was recommended for cytoreductive radical nephrectomy.  With history of ANCA positive vasculitis, also recommend thoracic lymphadenopathy biopsy to confirm distal metastasis.  Patient agreed to radical nephrectomy and declined bronchoscopy biopsy. He underwent left radical nephrectomy on 05/05/2019. Pathology showed pT3a pNx, RCC, conventional clear cell type, grade 3 Patient was discharged home. Today he presents to discuss pathology and the management plan. He was accompanied by his wife and daughter. Patient reports that he uses hydrocodone for pain.  Pain around the surgical site has improved.  He occasionally has tingling sensation around the surgical site.  Denies any fever or chills, drainage from the surgical site.  # Interval CT chest abdomen pelvis was independently reviewed by me and discussed with patient. 01/12/2020, CT showed overall marked improvement.  Comparing to CT scan in August 2021,  continues to have treatment response.Mediastinal soft tissue nodule nearly completely resolved, right minor fissure nodule 8 mm, decreased in size.  Resolution of pleural fluid and thickening of the right chest.  Small pleural effusion in the left chest.  No new lung nodules.-Subtle variation of the muscle architecture of the left psoas muscle of unknown significance.-Continue observation.  He is asymptomatic. Subtle nodule in the soft tissue of the left flank.  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 He finished 14 days of albendazole plus praziquantel .   He has had few side effects including poor appetite, hair loss, weight loss, bloody stool, abdominal cramping or diarrhea. All symptoms have improved since he completed the treatment. 03/16/2020 he finished tapering course of steroids  # It has been noted that his glucose level has been elevated in December 2021, even before he was started on prednisone treatments. Dr. Steva Ready has obtained A1c which came back elevated at 8.7.  INTERVAL HISTORY Mark Donaldson is a 60 y.o. male who has above history reviewed by me today presents for follow up visit for management of metastatic RCC Problems and complaints are listed below: Patient is Spanish-speaking.  Spanish interpreter present for the entire encounter for translation  Appetite has decreased. He had headache this morning.  Tapered off steroid. When he saw ID, he had hypotension and Dr. Steva Ready recommend him to hold off Lasix and contact primary care provider.  Patient has been off Lasix since then.  Today's blood pressure was initially elevated at 160s /117.  After sitting in the clinic for 20 minutes, blood pressure came down to 130/102.  Denies any shortness of breath, chest pain, cough.  No focal deficit He takes axitinib 5 mg twice daily.  Patient has developed intermittent diarrhea since his treatment  of neurocysticercosis.  Dr. Recently has checked GI panel  and C. difficile which were negative.  Review of systems- Review of Systems  Constitutional: Negative for appetite change, chills, fatigue, fever and unexpected weight change.  HENT:   Negative for voice change.   Eyes: Negative for eye problems and icterus.  Respiratory: Negative for chest tightness and cough.        Occasionally SOB at night  Cardiovascular: Negative for chest pain and leg swelling.  Gastrointestinal: Negative for abdominal distention and abdominal pain.  Endocrine: Negative for hot flashes.  Genitourinary: Negative for difficulty urinating, dysuria and frequency.   Musculoskeletal: Negative for arthralgias.  Skin: Negative for itching and rash.       Hair loss  Neurological: Negative for light-headedness and numbness.  Hematological: Negative for adenopathy. Does not bruise/bleed easily.  Psychiatric/Behavioral: Negative for confusion.    No Known Allergies  Patient Active Problem List   Diagnosis Date Noted  . Hyperglycemia 02/28/2020  . Stage 3b chronic kidney disease ([place]) 10/04/2019  . Brain lesion 10/04/2019  . Encounter for antineoplastic immunotherapy 07/19/2019  . Encounter for antineoplastic chemotherapy 07/19/2019  . CNS lesion 07/07/2019  . Anemia in stage 3b chronic kidney disease ([place]) 06/07/2019  . Goals of care, counseling/discussion 05/23/2019  . Renal cell carcinoma of left kidney ([place]) 05/23/2019  . Renal cell carcinoma ([place]) 05/23/2019  . Palliative care encounter   . History of vasculitis   . Thoracic lymphadenopathy   . Lung nodule   . CAD S/P percutaneous coronary angioplasty 05/02/2019  . Kidney mass 05/02/2019  . Hydronephrosis of left kidney 05/02/2019  . UTI (urinary tract infection) 05/02/2019  . AKI (acute kidney injury) ([place]) 05/02/2019  . Gross hematuria 05/02/2019  . Chronic diastolic CHF (congestive heart failure) ([place]) 05/02/2019  . Sepsis ([place]) 05/02/2019  . Severe sepsis ([place]) 05/02/2019  . Preop cardiovascular exam    . Hyperlipidemia 05/21/2016  . 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 Prediabetes 05/21/2016  . Alcohol abuse 05/18/2016  . Essential hypertension 05/18/2016  . Vasculitis, 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[place]) 05/18/2016  . Shortness of breath 05/17/2016     Past Medical History:  Diagnosis Date  . ANCA-positive vasculitis ([place])   . CAD (coronary artery disease)   . Essential hypertension   . HFrEF (heart failure with reduced ejection fraction) ([place])   . Hyperlipidemia LDL goal <70   . Ischemic cardiomyopathy   . Renal cell carcinoma ([place]) 05/23/2019  . Renal disorder      Past Surgical History:  Procedure Laterality Date  . CARDIAC SURGERY     cardiac cath with 2 stent placement  . LAPAROSCOPIC NEPHRECTOMY, HAND ASSISTED Left 05/05/2019   Procedure: HAND ASSISTED LAPAROSCOPIC NEPHRECTOMY;  Surgeon: Hollice Espy, MD;  Location: [facility] ORS;  Service: Urology;  Laterality: Left;    Social History   Socioeconomic History  . Marital status: Married    Spouse name: Not on file  . Number of children: Not on file  . Years of education: Not on file  . Highest education level: Not on file  Occupational History  . Not on file  Tobacco Use  . Smoking status: Never Smoker  . Smokeless tobacco: Never Used  Substance and Sexual Activity  . Alcohol use: Yes  . Drug use: Not on file  . Sexual activity: Not on file  Other Topics Concern  . Not on file  Social History Narrative  . Not on file   Social Determinants  of Health   Financial Resource Strain: Not on file  Food Insecurity: Not on file  Transportation Needs: Not on file  Physical Activity: Not on file  Stress: Not on file  Social Connections: Not on file  Intimate Partner Violence: Not on file     Family History  Problem Relation Age of Onset  . Cancer Mother   . Blindness Brother      Current Outpatient Medications:  .  aspirin 81 MG EC tablet, Take 81 mg by mouth daily. Swallow whole.,  Disp: , Rfl:  .  atorvastatin (LIPITOR) 80 MG tablet, Take 80 mg by mouth daily., Disp: , Rfl:  .  axitinib (INLYTA) 5 MG tablet, Take 1 tablet (5 mg total) by mouth 2 (two) times daily., Disp: 60 tablet, Rfl: 3 .  clopidogrel (PLAVIX) 75 MG tablet, Take 75 mg by mouth daily., Disp: , Rfl:  .  KEYTRUDA 100 MG/4ML SOLN, INJECT 200MG  INTRAVENOUSLY EVERY 3 WEEKS, Disp: 8 mL, Rfl: 11 .  lisinopril (ZESTRIL) 10 MG tablet, Take 10 mg by mouth daily., Disp: , Rfl:  .  furosemide (LASIX) 20 MG tablet, Take 1 tablet (20 mg total) by mouth daily. (Patient not taking: Reported on 04/10/2020), Disp: 30 tablet, Rfl: 3 .  metoprolol succinate (TOPROL-XL) 100 MG 24 hr tablet, Take 1.5 tablets by mouth daily., Disp: , Rfl:  No current facility-administered medications for this visit.  Facility-Administered Medications Ordered in Other Visits:  .  pembrolizumab (KEYTRUDA) 200 mg in sodium chloride 0.9 % 50 mL chemo infusion, 200 mg, Intravenous, Once, Earlie Server, MD   Physical exam:  Vitals:   04/10/20 0911 04/10/20 0950  BP: (!) 165/117 (!) 130/102  Pulse: 79 76  Resp: 18   Temp: (!) 97.5 F (36.4 C)   Weight: 153 lb (69.4 kg)    Physical Exam Constitutional:      General: He is not in acute distress.    Appearance: He is not diaphoretic.  HENT:     Head: Normocephalic and atraumatic.     Nose: Nose normal.     Mouth/Throat:     Pharynx: No oropharyngeal exudate.  Eyes:     General: No scleral icterus.    Pupils: Pupils are equal, round, and reactive to light.  Cardiovascular:     Rate and Rhythm: Normal rate and regular rhythm.     Heart sounds: No murmur heard.   Pulmonary:     Effort: Pulmonary effort is normal. No respiratory distress.     Breath sounds: No rales.  Chest:     Chest wall: No tenderness.  Abdominal:     General: There is no distension.     Palpations: Abdomen is soft.     Tenderness: There is no abdominal tenderness.  Musculoskeletal:        General: Normal range of  motion.     Cervical back: Normal range of motion and neck supple.  Skin:    General: Skin is warm and dry.     Findings: No erythema.  Neurological:     Mental Status: He is alert and oriented to person, place, and time.     Cranial Nerves: No cranial nerve deficit.     Motor: No abnormal muscle tone.     Coordination: Coordination normal.  Psychiatric:        Mood and Affect: Affect normal.        CMP Latest Ref Rng & Units 04/10/2020  Glucose 70 - 99 mg/dL 107(H)  BUN 6 - 20 mg/dL 23(H)  Creatinine 0.61 - 1.24 mg/dL 1.77(H)  Sodium 135 - 145 mmol/L 139  Potassium 3.5 - 5.1 mmol/L 4.7  Chloride 98 - 111 mmol/L 104  CO2 22 - 32 mmol/L 23  Calcium 8.9 - 10.3 mg/dL 9.2  Total Protein 6.5 - 8.1 g/dL 7.0  Total Bilirubin 0.3 - 1.2 mg/dL 1.1  Alkaline Phos 38 - 126 U/L 77  AST 15 - 41 U/L 33  ALT 0 - 44 U/L 22   CBC Latest Ref Rng & Units 04/10/2020  WBC 4.0 - 10.5 K/uL 11.1(H)  Hemoglobin 13.0 - 17.0 g/dL 14.4  Hematocrit 39.0 - 52.0 % 43.2  Platelets 150 - 400 K/uL 184    RADIOGRAPHIC STUDIES: I have personally reviewed the radiological images as listed and agreed with the findings in the report. CT CHEST ABDOMEN PELVIS WO CONTRAST  Addendum Date: 01/13/2020   ADDENDUM REPORT: 01/13/2020 11:45 ADDENDUM: There is no sign of aortic atherosclerosis on the current study. There is evidence of coronary artery disease. Please disregard the RAF code that was previously provided. Electronically Signed   By: Zetta Bills M.D.   On: 01/13/2020 11:45   Result Date: 01/13/2020 CLINICAL DATA:  History of renal cell carcinoma post LEFT nephrectomy in April of 2021. Ongoing therapy by report. EXAM: CT CHEST, ABDOMEN AND PELVIS WITHOUT CONTRAST TECHNIQUE: Multidetector CT imaging of the chest, abdomen and pelvis was performed following the standard protocol without IV contrast. COMPARISON:  CT evaluation from May 03, 2019. FINDINGS: CT CHEST FINDINGS Cardiovascular: Noncontrast  appearance of the heart great vessels is remarkable only for coronary artery calcification of LEFT coronary circulation and signs of prior percutaneous coronary intervention. Small calcification also in RIGHT coronary circulation along the RIGHT heart border. No pericardial effusion. Normal caliber thoracic aorta. Normal caliber central pulmonary vessels. Limited assessment of cardiovascular structures given lack of intravenous contrast. A Mediastinum/Nodes: Thoracic inlet structures are normal. No axillary lymphadenopathy. No mediastinal lymphadenopathy. No gross hilar adenopathy. Enhancing paramediastinal soft tissue nodule seen on the previous exam have nearly completely resolved. For instance, a small soft tissue nodule along the RIGHT anterior mediastinal margin (image 19, series 2) 4-5 mm previously in the range of 15 mm (on the April evaluation. As compared to the PET exam this area previously measured approximately 13 mm. Other areas which were larger than this focus on the prior study of April of 2021 are no longer evident. Lungs/Pleura: Nodule along the minor fissure in the RIGHT chest (image 69, series 5) 8 mm, previous 18 mm on the April study and 11 mm on the most recent comparison imaging, PET evaluation from August of 2021. Resolution of pleural fluid and thickening in the RIGHT chest and small pleural effusion in the LEFT chest since the prior study. No new pulmonary nodule. Nodules deep within the pleural space along the RIGHT hemidiaphragm and retrocrural implants seen on the previous study from April have resolved as on the previous PET evaluation. Musculoskeletal: See below for full musculoskeletal details. CT ABDOMEN PELVIS FINDINGS Hepatobiliary: Normal hepatic contour. No focal lesion on noncontrast imaging. No pericholecystic stranding. Pancreas: Normal contour no sign of inflammation. Spleen: Normal, posteriorly oriented following LEFT nephrectomy. Adrenals/Urinary Tract: Adrenal glands are  normal. No RIGHT-sided hydronephrosis or nephrolithiasis. Post LEFT nephrectomy as before. Suture line along the LEFT renal vasculature shows a similar appearance and shows no sign of increasing nodularity or soft tissue thickening. Very subtle variation in LEFT psoas musculature while nonspecific and  potentially artifact related, also suggested however on coronal images, very subtle change in muscular structure and asymmetry and may measure as much as 2 x 1.5 cm. Stomach/Bowel: Scattered colonic diverticulosis.  Normal appendix. Stomach distended with ingested contrast material. No small bowel dilation or signs of small bowel inflammation. Vascular/Lymphatic: Normal caliber abdominal aorta. There is no gastrohepatic or hepatoduodenal ligament lymphadenopathy. No retroperitoneal or mesenteric lymphadenopathy. The No pelvic sidewall lymphadenopathy. Reproductive: Prostate mildly enlarged. Other: Subtle nodularity in the soft tissues of the LEFT flank (image 76 of series 2 measuring approximately 7 mm. No ascites. No peritoneal nodularity. Musculoskeletal: Spinal degenerative changes. No acute or destructive bone process. 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 IMPRESSION: 1. Overall marked improvement with respect to disease in the chest, abdomen and pelvis when compared to the study of April 2021 and continued improvement when compared to the exam from August of 2021. 2. Very subtle variation in muscular architecture of the LEFT psoas muscle is of uncertain significance and potentially artifact related. However, given the potential for intramuscular metastatic lesions in the setting of renal cell carcinoma with consider short interval follow-up assessment with MRI. Subsequent follow-up imaging could also be performed with MRI of the abdomen for follow-up in this high-risk patient with additional coverage provided with noncontrast CT as warranted. 3. Subtle nodularity in  the soft tissues of the LEFT flank measuring approximately 7 mm. This could represent an injection site or area of contusion with given focal appearance would suggest attention on subsequent imaging. 4. 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 5. Post LEFT nephrectomy. 6. Coronary artery calcification and signs of prior percutaneous coronary intervention. 7. Aortic atherosclerosis. Aortic Atherosclerosis (ICD10-I70.0). Electronically Signed: By: Zetta Bills M.D. On: 01/12/2020 11:51    Assessment and plan-  Patient is a 60 y.o. male history of CAD, status post stent, CHF, history of ANCA positive sclerosing/crescentic GN, chronic kidney disease stage III presents for treatments of stage IV renal cell carcinoma 1. Renal cell carcinoma of left kidney (HCC)   2. Encounter for antineoplastic chemotherapy   3. Encounter for antineoplastic immunotherapy   4. History of CHF (congestive heart failure)   5. Brain lesion   Cancer Staging Renal cell carcinoma of left kidney (HCC) Staging form: Kidney, AJCC 8th Edition - Pathologic: Stage IV (pT3a, pNX, cM1) - Signed by Earlie Server, MD on 05/23/2019   # Stage IV renal cell carcinoma Labs are reviewed and discussed with patient. Proceed with Keytruda today.   Continue axitinib 5 mg twice daily Obtain CT chest abdomen pelvis wo contrast prior to next visit.  #Hypertension, CHF,  Continue metoprolol, lisinopril.  BP is high today.  Advised patient to resume Lasix 20 mg daily.  Recommend patient monitor BP 1-2 times per week. Recommend patient to follow-up with primary care provider.   #Intermittent orthopnea, chronic issue for him. Pre-existing diastolic CHF, 9/89/2119 LVEF 60 to 65%.  Continue Lasix 20 mg daily. Recommend him to follow-up with cardiology-he is has not able to follow-up with cardiology due to lack of insurance coverage.  #Brain lesion  neurocysticercosis finished therapy.  Follow-up with  ID  #CKD, creatinine stable  Avoid nephrotoxin. Encourage oral hydration.stbale Cr # Hyperkalemia follows up with Dr. Holley Raring.  Potassium has normalized today. #Diabetes, A1c 8.7. Both axitinib and prednisone may have exacerbated his hypoglycemia. Glucose level has improved.  We spent sufficient time to discuss many aspect of care, questions  were answered to patient's satisfaction. Follow-up in 3 weeks for follow-up of evaluation prior to immunotherapy. Thank you for allowing me to participate in the care of this patient.   Earlie Server, MD, PhD Hematology Oncology [facility] at [facility] Pager- [phone] 04/10/2020

## 2020-04-10 NOTE — Progress Notes (Signed)
Blood Pressure: 130/102 during MD visit. MD, Dr. Tasia Catchings, is already aware. Per MD order: proceed with scheduled Keytruda treatment today.

## 2020-04-10 NOTE — Progress Notes (Signed)
Patient here for follow up. Pt reports that he has occasional episodes of diarrhea. Last episode was last night. Patient no longer taking Furosemide.

## 2020-04-30 ENCOUNTER — Other Ambulatory Visit: Payer: Self-pay

## 2020-04-30 ENCOUNTER — Ambulatory Visit
Admission: RE | Admit: 2020-04-30 | Discharge: 2020-04-30 | Disposition: A | Discharge status: Home / Self Care | Payer: Self-pay | Source: Ambulatory Visit | Attending: Oncology | Admitting: Oncology

## 2020-04-30 DIAGNOSIS — C642 Malignant neoplasm of left kidney, except renal pelvis: Secondary | ICD-10-CM | POA: Insufficient documentation

## 2020-05-01 ENCOUNTER — Inpatient Hospital Stay: Payer: Self-pay

## 2020-05-01 ENCOUNTER — Inpatient Hospital Stay: Payer: Self-pay | Attending: Oncology

## 2020-05-01 ENCOUNTER — Encounter: Payer: Self-pay | Admitting: Oncology

## 2020-05-01 ENCOUNTER — Other Ambulatory Visit: Payer: Self-pay

## 2020-05-01 ENCOUNTER — Inpatient Hospital Stay (HOSPITAL_BASED_OUTPATIENT_CLINIC_OR_DEPARTMENT_OTHER): Payer: Self-pay | Admitting: Oncology

## 2020-05-01 ENCOUNTER — Telehealth: Payer: Self-pay | Admitting: Pharmacy Technician

## 2020-05-01 VITALS — BP 150/107 | HR 87 | Temp 97.5°F | Resp 18 | Wt 149.7 lb

## 2020-05-01 DIAGNOSIS — R197 Diarrhea, unspecified: Secondary | ICD-10-CM

## 2020-05-01 DIAGNOSIS — I130 Hypertensive heart and chronic kidney disease with heart failure and stage 1 through stage 4 chronic kidney disease, or unspecified chronic kidney disease: Secondary | ICD-10-CM | POA: Insufficient documentation

## 2020-05-01 DIAGNOSIS — C642 Malignant neoplasm of left kidney, except renal pelvis: Secondary | ICD-10-CM

## 2020-05-01 DIAGNOSIS — Z8679 Personal history of other diseases of the circulatory system: Secondary | ICD-10-CM | POA: Insufficient documentation

## 2020-05-01 DIAGNOSIS — Z5112 Encounter for antineoplastic immunotherapy: Secondary | ICD-10-CM

## 2020-05-01 DIAGNOSIS — I10 Essential (primary) hypertension: Secondary | ICD-10-CM

## 2020-05-01 DIAGNOSIS — Z79899 Other long term (current) drug therapy: Secondary | ICD-10-CM | POA: Insufficient documentation

## 2020-05-01 DIAGNOSIS — I2510 Atherosclerotic heart disease of native coronary artery without angina pectoris: Secondary | ICD-10-CM | POA: Insufficient documentation

## 2020-05-01 DIAGNOSIS — Z5111 Encounter for antineoplastic chemotherapy: Secondary | ICD-10-CM

## 2020-05-01 DIAGNOSIS — I255 Ischemic cardiomyopathy: Secondary | ICD-10-CM | POA: Insufficient documentation

## 2020-05-01 DIAGNOSIS — E1122 Type 2 diabetes mellitus with diabetic chronic kidney disease: Secondary | ICD-10-CM | POA: Insufficient documentation

## 2020-05-01 DIAGNOSIS — N1832 Chronic kidney disease, stage 3b: Secondary | ICD-10-CM | POA: Insufficient documentation

## 2020-05-01 DIAGNOSIS — M879 Osteonecrosis, unspecified: Secondary | ICD-10-CM | POA: Insufficient documentation

## 2020-05-01 DIAGNOSIS — Z7982 Long term (current) use of aspirin: Secondary | ICD-10-CM | POA: Insufficient documentation

## 2020-05-01 DIAGNOSIS — E785 Hyperlipidemia, unspecified: Secondary | ICD-10-CM | POA: Insufficient documentation

## 2020-05-01 DIAGNOSIS — I5042 Chronic combined systolic (congestive) and diastolic (congestive) heart failure: Secondary | ICD-10-CM | POA: Insufficient documentation

## 2020-05-01 DIAGNOSIS — Z905 Acquired absence of kidney: Secondary | ICD-10-CM | POA: Insufficient documentation

## 2020-05-01 LAB — CBC WITH DIFFERENTIAL/PLATELET
Abs Immature Granulocytes: 0.01 10*3/uL (ref 0.00–0.07)
Basophils Absolute: 0.1 10*3/uL (ref 0.0–0.1)
Basophils Relative: 1 %
Eosinophils Absolute: 0.4 10*3/uL (ref 0.0–0.5)
Eosinophils Relative: 5 %
HCT: 45.2 % (ref 39.0–52.0)
Hemoglobin: 14.5 g/dL (ref 13.0–17.0)
Immature Granulocytes: 0 %
Lymphocytes Relative: 26 %
Lymphs Abs: 2.1 10*3/uL (ref 0.7–4.0)
MCH: 28.3 pg (ref 26.0–34.0)
MCHC: 32.1 g/dL (ref 30.0–36.0)
MCV: 88.3 fL (ref 80.0–100.0)
Monocytes Absolute: 0.9 10*3/uL (ref 0.1–1.0)
Monocytes Relative: 11 %
Neutro Abs: 4.8 10*3/uL (ref 1.7–7.7)
Neutrophils Relative %: 57 %
Platelets: 195 10*3/uL (ref 150–400)
RBC: 5.12 MIL/uL (ref 4.22–5.81)
RDW: 16.9 % — ABNORMAL HIGH (ref 11.5–15.5)
WBC: 8.2 10*3/uL (ref 4.0–10.5)
nRBC: 0 % (ref 0.0–0.2)

## 2020-05-01 LAB — COMPREHENSIVE METABOLIC PANEL
ALT: 17 U/L (ref 0–44)
AST: 33 U/L (ref 15–41)
Albumin: 4.2 g/dL (ref 3.5–5.0)
Alkaline Phosphatase: 79 U/L (ref 38–126)
Anion gap: 7 (ref 5–15)
BUN: 20 mg/dL (ref 6–20)
CO2: 28 mmol/L (ref 22–32)
Calcium: 9.9 mg/dL (ref 8.9–10.3)
Chloride: 105 mmol/L (ref 98–111)
Creatinine, Ser: 1.86 mg/dL — ABNORMAL HIGH (ref 0.61–1.24)
GFR, Estimated: 41 mL/min — ABNORMAL LOW (ref >60)
Glucose, Bld: 115 mg/dL — ABNORMAL HIGH (ref 70–99)
Potassium: 4.1 mmol/L (ref 3.5–5.1)
Sodium: 140 mmol/L (ref 135–145)
Total Bilirubin: 1.2 mg/dL (ref 0.3–1.2)
Total Protein: 7.3 g/dL (ref 6.5–8.1)

## 2020-05-01 LAB — TSH: TSH: 7.644 u[IU]/mL — ABNORMAL HIGH (ref 0.350–4.500)

## 2020-05-01 LAB — T4, FREE: Free T4: 1.1 ng/dL (ref 0.61–1.12)

## 2020-05-01 NOTE — Progress Notes (Signed)
Hematology/Oncology Progress Note [facility] Telephone:([phone] [phone]  Patient Care Team: Center, [facility] as PCP - General (General Practice)   Name of the patient: Mark Donaldson  [id number]  1960-10-18  Date of visit: 05/01/20   INTERVAL HISTORY-  60 y.o. male presents for follow-up of renal cell carcinoma treatments. Patient has past medical history CAD status post PCI to LAD in 2018, on aspirin and Plavix, CHF, history of focal sclerosing/crescentic GN-ANCA positive and CKD stage III. He was hospitalized from 05/02/2019-05/07/2019 due to gross hematuria. CT showed a 7.4 cm left lower pole renal mass concerning for RCC.  Patient also has necrotic lymphadenopathy in the mediastinum and the right supra hilar region which were highly suspicious for metastatic disease.  Bilateral lung nodules, also concerning for metastatic lung disease.  Right retrocrural lymphadenopathy with upper normal left periaortic lymph node. Patient was recommended for cytoreductive radical nephrectomy.  With history of ANCA positive vasculitis, also recommend thoracic lymphadenopathy biopsy to confirm distal metastasis.  Patient agreed to radical nephrectomy and declined bronchoscopy biopsy. He underwent left radical nephrectomy on 05/05/2019. Pathology showed pT3a pNx, RCC, conventional clear cell type, grade 3 Patient was discharged home. Today he presents to discuss pathology and the management plan. He was accompanied by his wife and daughter. Patient reports that he uses hydrocodone for pain.  Pain around the surgical site has improved.  He occasionally has tingling sensation around the surgical site.  Denies any fever or chills, drainage from the surgical site.  # Interval CT chest abdomen pelvis was independently reviewed by me and discussed with patient. 01/12/2020, CT showed overall marked improvement.  Comparing to CT scan in August 2021,  continues to have treatment response.Mediastinal soft tissue nodule nearly completely resolved, right minor fissure nodule 8 mm, decreased in size.  Resolution of pleural fluid and thickening of the right chest.  Small pleural effusion in the left chest.  No new lung nodules.-Subtle variation of the muscle architecture of the left psoas muscle of unknown significance.-Continue observation.  He is asymptomatic. Subtle nodule in the soft tissue of the left flank.  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 He finished 14 days of albendazole plus praziquantel .   He has had few side effects including poor appetite, hair loss, weight loss, bloody stool, abdominal cramping or diarrhea. All symptoms have improved since he completed the treatment. 03/16/2020 he finished tapering course of steroids  # It has been noted that his glucose level has been elevated in December 2021, even before he was started on prednisone treatments. Dr. Steva Ready has obtained A1c which came back elevated at 8.7.  INTERVAL HISTORY Mark Donaldson is a 60 y.o. male who has above history reviewed by me today presents for follow up visit for management of metastatic RCC Problems and complaints are listed below: Patient is Spanish-speaking.  Spanish interpreter present for the entire encounter for translation Patient reports decreased appetite. Since his course of antibiotics and steroid treatment for neurocysticercosis, he has been having frequent bowel movements, usually loose.  Denies any abdominal pain, bloody bowel movement. He has lost 4 pounds since last visit. He takes axitinib 5 mg twice daily.   Review of systems- Review of Systems  Constitutional: Negative for appetite change, chills, fatigue, fever and unexpected weight change.  HENT:   Negative for voice change.   Eyes: Negative for eye problems and icterus.  Respiratory: Negative for chest tightness and cough.  Occasionally SOB at  night  Cardiovascular: Negative for chest pain and leg swelling.  Gastrointestinal: Positive for diarrhea. Negative for abdominal distention and abdominal pain.  Endocrine: Negative for hot flashes.  Genitourinary: Negative for difficulty urinating, dysuria and frequency.   Musculoskeletal: Negative for arthralgias.  Skin: Negative for itching and rash.       Hair loss  Neurological: Negative for light-headedness and numbness.  Hematological: Negative for adenopathy. Does not bruise/bleed easily.  Psychiatric/Behavioral: Negative for confusion.    No Known Allergies  Patient Active Problem List   Diagnosis Date Noted  . History of CHF (congestive heart failure) 05/01/2020  . Hyperglycemia 02/28/2020  . Stage 3b chronic kidney disease ([place]) 10/04/2019  . Brain lesion 10/04/2019  . Encounter for antineoplastic immunotherapy 07/19/2019  . Encounter for antineoplastic chemotherapy 07/19/2019  . CNS lesion 07/07/2019  . Anemia in stage 3b chronic kidney disease ([place]) 06/07/2019  . Goals of care, counseling/discussion 05/23/2019  . Renal cell carcinoma of left kidney ([place]) 05/23/2019  . Renal cell carcinoma ([place]) 05/23/2019  . Palliative care encounter   . History of vasculitis   . Thoracic lymphadenopathy   . Lung nodule   . CAD S/P percutaneous coronary angioplasty 05/02/2019  . Kidney mass 05/02/2019  . Hydronephrosis of left kidney 05/02/2019  . UTI (urinary tract infection) 05/02/2019  . AKI (acute kidney injury) ([place]) 05/02/2019  . Gross hematuria 05/02/2019  . Chronic diastolic CHF (congestive heart failure) ([place]) 05/02/2019  . Sepsis ([place]) 05/02/2019  . Severe sepsis ([place]) 05/02/2019  . Preop cardiovascular exam   . Hyperlipidemia 05/21/2016  . 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 Prediabetes 05/21/2016  . Alcohol abuse 05/18/2016  . Essential hypertension 05/18/2016  . Vasculitis, 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[place]) 05/18/2016  . Shortness of  breath 05/17/2016     Past Medical History:  Diagnosis Date  . ANCA-positive vasculitis ([place])   . CAD (coronary artery disease)   . Essential hypertension   . HFrEF (heart failure with reduced ejection fraction) ([place])   . Hyperlipidemia LDL goal <70   . Ischemic cardiomyopathy   . Renal cell carcinoma ([place]) 05/23/2019  . Renal disorder      Past Surgical History:  Procedure Laterality Date  . CARDIAC SURGERY     cardiac cath with 2 stent placement  . LAPAROSCOPIC NEPHRECTOMY, HAND ASSISTED Left 05/05/2019   Procedure: HAND ASSISTED LAPAROSCOPIC NEPHRECTOMY;  Surgeon: Hollice Espy, MD;  Location: [facility] ORS;  Service: Urology;  Laterality: Left;    Social History   Socioeconomic History  . Marital status: Married    Spouse name: Not on file  . Number of children: Not on file  . Years of education: Not on file  . Highest education level: Not on file  Occupational History  . Not on file  Tobacco Use  . Smoking status: Never Smoker  . Smokeless tobacco: Never Used  Substance and Sexual Activity  . Alcohol use: Yes  . Drug use: Not on file  . Sexual activity: Not on file  Other Topics Concern  . Not on file  Social History Narrative  . Not on file   Social Determinants of Health   Financial Resource Strain: Not on file  Food Insecurity: Not on file  Transportation Needs: Not on file  Physical Activity: Not on file  Stress: Not on file  Social Connections: Not on file  Intimate Partner Violence: Not on file     Family History  Problem Relation  Age of Onset  . Cancer Mother   . Blindness Brother      Current Outpatient Medications:  .  aspirin 81 MG EC tablet, Take 81 mg by mouth daily. Swallow whole., Disp: , Rfl:  .  atorvastatin (LIPITOR) 80 MG tablet, Take 80 mg by mouth daily., Disp: , Rfl:  .  axitinib (INLYTA) 5 MG tablet, Take 1 tablet (5 mg total) by mouth 2 (two) times daily., Disp: 60 tablet, Rfl: 3 .  clopidogrel (PLAVIX) 75 MG tablet, Take 75  mg by mouth daily., Disp: , Rfl:  .  furosemide (LASIX) 20 MG tablet, Take 1 tablet (20 mg total) by mouth daily., Disp: 30 tablet, Rfl: 3 .  KEYTRUDA 100 MG/4ML SOLN, INJECT 200MG  INTRAVENOUSLY EVERY 3 WEEKS, Disp: 8 mL, Rfl: 11 .  lisinopril (ZESTRIL) 10 MG tablet, Take 10 mg by mouth daily., Disp: , Rfl:  .  metoprolol succinate (TOPROL-XL) 100 MG 24 hr tablet, Take 1.5 tablets by mouth daily., Disp: , Rfl:    Physical exam:  Vitals:   05/01/20 0934  BP: (!) 150/107  Pulse: 87  Resp: 18  Temp: (!) 97.5 F (36.4 C)  Weight: 149 lb 11.2 oz (67.9 kg)   Physical Exam Constitutional:      General: He is not in acute distress.    Appearance: He is not diaphoretic.  HENT:     Head: Normocephalic and atraumatic.     Nose: Nose normal.     Mouth/Throat:     Pharynx: No oropharyngeal exudate.  Eyes:     General: No scleral icterus.    Pupils: Pupils are equal, round, and reactive to light.  Cardiovascular:     Rate and Rhythm: Normal rate and regular rhythm.     Heart sounds: No murmur heard.   Pulmonary:     Effort: Pulmonary effort is normal. No respiratory distress.     Breath sounds: No rales.  Chest:     Chest wall: No tenderness.  Abdominal:     General: There is no distension.     Palpations: Abdomen is soft.     Tenderness: There is no abdominal tenderness.  Musculoskeletal:        General: Normal range of motion.     Cervical back: Normal range of motion and neck supple.  Skin:    General: Skin is warm and dry.     Findings: No erythema.  Neurological:     Mental Status: He is alert and oriented to person, place, and time.     Cranial Nerves: No cranial nerve deficit.     Motor: No abnormal muscle tone.     Coordination: Coordination normal.  Psychiatric:        Mood and Affect: Affect normal.        CMP Latest Ref Rng & Units 05/01/2020  Glucose 70 - 99 mg/dL 115(H)  BUN 6 - 20 mg/dL 20  Creatinine 0.61 - 1.24 mg/dL 1.86(H)  Sodium 135 - 145 mmol/L  140  Potassium 3.5 - 5.1 mmol/L 4.1  Chloride 98 - 111 mmol/L 105  CO2 22 - 32 mmol/L 28  Calcium 8.9 - 10.3 mg/dL 9.9  Total Protein 6.5 - 8.1 g/dL 7.3  Total Bilirubin 0.3 - 1.2 mg/dL 1.2  Alkaline Phos 38 - 126 U/L 79  AST 15 - 41 U/L 33  ALT 0 - 44 U/L 17   CBC Latest Ref Rng & Units 05/01/2020  WBC 4.0 - 10.5 K/uL 8.2  Hemoglobin 13.0 - 17.0 g/dL 14.5  Hematocrit 39.0 - 52.0 % 45.2  Platelets 150 - 400 K/uL 195    RADIOGRAPHIC STUDIES: I have personally reviewed the radiological images as listed and agreed with the findings in the report. CT CHEST ABDOMEN PELVIS WO CONTRAST  Result Date: 04/30/2020 CLINICAL DATA:  History of renal cell carcinoma status post left nephrectomy April 2021. Ongoing therapy. EXAM: CT CHEST, ABDOMEN AND PELVIS WITHOUT CONTRAST TECHNIQUE: Multidetector CT imaging of the chest, abdomen and pelvis was performed following the standard protocol without IV contrast. COMPARISON:  Multiple priors hemispheric most recent CT chest abdomen pelvis January 12, 2020. FINDINGS: CT CHEST FINDINGS Cardiovascular: No thoracic aortic aneurysm. No aortic atherosclerosis. Three-vessel coronary artery calcifications. Normal size heart. No significant pericardial effusion/thickening. Mediastinum/Nodes: No pathologically enlarged mediastinal hilar lymph nodes. Similar size of the paramediastinal soft tissue nodularity for instance a small soft tissue nodule along the right anterior mediastinal margin measures 4 mm on image 22/2, unchanged. No new or enlarging lesions. Thyroid glands grossly unremarkable. Trachea esophagus are grossly unremarkable. Lungs/Pleura: Unchanged size of the 8 mm pulmonary nodule along the minor fissure in the right chest on image 75/4. No new suspicious pulmonary nodules or masses. No focal consolidations. No pleural effusion. No pneumothorax. Musculoskeletal: Multilevel degenerative changes spine. No aggressive lytic or blastic lesion of bone. CT ABDOMEN  PELVIS FINDINGS Hepatobiliary: Unremarkable noncontrast appearance of the hepatic parenchyma. Gallbladder is grossly unremarkable. No biliary ductal dilation. Pancreas: Within normal limits. Spleen: Within normal limits. Adrenals/Urinary Tract: Bilateral adrenal glands are unremarkable. Status post left nephrectomy without new suspicious soft tissue nodularity in the nephrectomy bed. No right-sided hydronephrosis or contour deforming renal mass. Urinary bladder is grossly unremarkable for degree of distension. Stomach/Bowel: Stomach is grossly unremarkable for degree of distension. Normal positioning of the duodenum/ligament of [place]. No suspicious small bowel dilation. Unremarkable appearance of the appendix and terminal ileum. Radio opaque contrast traverses the rectum. Left-sided colonic diverticulosis without findings of acute diverticulitis. Vascular/Lymphatic: No significant vascular findings are present. No gastrohepatic or hepatoduodenal ligament lymphadenopathy. No retroperitoneal or mesenteric lymphadenopathy. No pelvic sidewall lymphadenopathy. No groin lymphadenopathy. Reproductive: Mildly enlarged prostate. Other: No abdominopelvic ascites. No discrete omental or mesenteric nodularity. Musculoskeletal: Multilevel degenerative changes spine. Avascular necrosis of bilateral femoral heads, similar prior. No aggressive lytic or blastic lesion of bone. Previously described subtle asymmetry of the right psoas musculature is not evident on today's examination. IMPRESSION: 1. Stable exam. Status post left nephrectomy without evidence recurrence. No evidence of new or enlarging metastatic disease in the chest, abdomen or pelvis. 2. Previously described subtle asymmetry of the right psoas musculature is not evident on today's examination. 3. Left-sided colonic diverticulosis without findings of acute diverticulitis. 4. Avascular necrosis of bilateral femoral heads, similar over multiple priors. Electronically  Signed   By: Dahlia Bailiff MD   On: 04/30/2020 12:19    Assessment and plan-  Patient is a 60 y.o. male history of CAD, status post stent, CHF, history of ANCA positive sclerosing/crescentic GN, chronic kidney disease stage III presents for treatments of stage IV renal cell carcinoma 1. Renal cell carcinoma of left kidney (HCC)   2. Encounter for antineoplastic chemotherapy   3. Encounter for antineoplastic immunotherapy   4. History of CHF (congestive heart failure)   5. Primary hypertension   6. Diarrhea, unspecified type   Cancer Staging Renal cell carcinoma of left kidney (HCC) Staging form: Kidney, AJCC 8th Edition - Pathologic: Stage IV (pT3a, pNX, cM1) - Signed by  Earlie Server, MD on 05/23/2019   # Stage IV renal cell carcinoma 04/30/2020, CT chest abdomen pelvis without contrast showed stable disease.  No evidence of new or enlarging metastasis in the chest abdomen or pelvis.  Stable pulmonary nodule and mediastinal/paramediastinal soft tissue nodularity subtle asymmetry of the right psoas musculature is not evident on this examination.  Left-sided diverticulosis without findings of acute diverticulitis.  Avascular necrosis of bilateral femoral heads. Hold off Keytruda today due to diarrhea.  Advised patient to stop axitinib drip as well.  See below  #Chronic diarrhea Check C. difficile and GI panel. If negative, consider antidiarrhea medication as needed.  Hold both Keytruda and Axitinib.   #Hypertension, CHF,  Continue metoprolol, lisinopril.  Lasix BP is high today.  As he has not taken his morning blood pressure yet.   #Intermittent orthopnea, chronic issue for him. Pre-existing diastolic CHF, 7/32/2025 LVEF 60 to 65%.  Continue Lasix 20 mg daily. Recommend him to follow-up with cardiology-he is has not able to follow-up with cardiology due to lack of insurance coverage.  #Brain lesion  neurocysticercosis finished therapy.  Follow-up with ID  #CKD, creatinine stable   Avoid nephrotoxin. Encourage oral hydration.stbale Cr  #Diabetes, A1c 8.7.  Glucose level has been stable.  We spent sufficient time to discuss many aspect of care, questions were answered to patient's satisfaction. Follow-up in 2 weeks for follow-up of evaluation prior to immunotherapy. Thank you for allowing me to participate in the care of this patient.   Earlie Server, MD, PhD Hematology Oncology [facility] at [facility] Pager- [phone] 05/01/2020

## 2020-05-01 NOTE — Telephone Encounter (Signed)
Oral Oncology Patient Advocate Encounter  Met patient after appt on 05/01/20 to complete renewal application for [employer] in an effort to reduce patient's out of pocket expense for Inlyta to $0.    Application completed and faxed to [phone].   [employer] patient assistance phone number for follow up is [phone].   This encounter will be updated until final determination.   Mark Donaldson Patient [place] Phone [phone] Fax [phone] 05/04/2020 3:05 PM

## 2020-05-01 NOTE — Progress Notes (Signed)
Patient here for follow up. Pt reports decreased appetite due to loss of taste. Pt reports that he has diarrhea daily.

## 2020-05-02 ENCOUNTER — Inpatient Hospital Stay: Admit: 2020-05-02 | Discharge: 2020-05-02 | Disposition: A | Discharge status: Home / Self Care | Payer: Self-pay

## 2020-05-02 DIAGNOSIS — C642 Malignant neoplasm of left kidney, except renal pelvis: Secondary | ICD-10-CM

## 2020-05-02 DIAGNOSIS — R197 Diarrhea, unspecified: Secondary | ICD-10-CM

## 2020-05-02 LAB — GASTROINTESTINAL PANEL BY PCR, STOOL (REPLACES STOOL CULTURE)

## 2020-05-02 LAB — C DIFFICILE QUICK SCREEN W PCR REFLEX
C Diff antigen: NEGATIVE
C Diff interpretation: NOT DETECTED
C Diff toxin: NEGATIVE

## 2020-05-03 ENCOUNTER — Other Ambulatory Visit: Payer: Self-pay | Admitting: Oncology

## 2020-05-03 ENCOUNTER — Telehealth: Payer: Self-pay | Admitting: *Deleted

## 2020-05-03 MED ORDER — LOPERAMIDE HCL 2 MG PO CAPS: 2.0000 mg | ORAL_CAPSULE | ORAL | 0 refills | Status: DC | PRN

## 2020-05-03 MED ORDER — LOPERAMIDE HCL 2 MG PO CAPS: 2.0000 mg | ORAL_CAPSULE | ORAL | 0 refills | Status: DC

## 2020-05-03 NOTE — Telephone Encounter (Signed)
Call returned to [facility] and directions given to pharmacist

## 2020-05-03 NOTE — Telephone Encounter (Signed)
Pharmacy called needing clarification of directions on patient Loperimide prescription. Looks like there are 2 sets of instructions

## 2020-05-03 NOTE — Telephone Encounter (Signed)
Take 2 tablets with onset of diarrhea, then 1 tablet every 2 hours until diarrhea stops. Maximum 8 tablets per 24 hours.

## 2020-05-09 NOTE — Progress Notes (Signed)
The following Medication: Beryle Flock has been approved for re-enrollment thru [employer] PAP. No interruptions of services, original program expires 05/24/2020. Approval extended to 05/08/2020 - 05/08/2021. Reason for Assist: Self. ID: [id number].  Next DOS: 05/15/2020.  Madalyn Rob, CPhT IV Drug Replacement Specialist  [place] Phone: [phone]

## 2020-05-09 NOTE — Telephone Encounter (Signed)
Oral Oncology Patient Advocate Encounter  Received notification from [place] Patient Assistance program that patient has been successfully re-enrolled into their program to receive Inlyta from the manufacturer at $0 out of pocket until 05/07/2021.    Specialty Pharmacy that will dispense medication is [facility].  Patient knows to call the office with questions or concerns.   Oral Oncology Clinic will continue to follow.  [place] Patient [place] Phone [phone] Fax [phone] 05/09/2020 11:01 AM\

## 2020-05-15 ENCOUNTER — Inpatient Hospital Stay (HOSPITAL_BASED_OUTPATIENT_CLINIC_OR_DEPARTMENT_OTHER): Payer: Self-pay | Admitting: Oncology

## 2020-05-15 ENCOUNTER — Encounter: Payer: Self-pay | Admitting: Oncology

## 2020-05-15 ENCOUNTER — Inpatient Hospital Stay: Payer: Self-pay | Attending: Oncology

## 2020-05-15 ENCOUNTER — Inpatient Hospital Stay: Payer: Self-pay

## 2020-05-15 VITALS — BP 103/73 | HR 70 | Temp 98.9°F | Resp 18 | Wt 150.7 lb

## 2020-05-15 DIAGNOSIS — N1832 Chronic kidney disease, stage 3b: Secondary | ICD-10-CM

## 2020-05-15 DIAGNOSIS — I5020 Unspecified systolic (congestive) heart failure: Secondary | ICD-10-CM | POA: Insufficient documentation

## 2020-05-15 DIAGNOSIS — Z905 Acquired absence of kidney: Secondary | ICD-10-CM | POA: Insufficient documentation

## 2020-05-15 DIAGNOSIS — I5032 Chronic diastolic (congestive) heart failure: Secondary | ICD-10-CM | POA: Insufficient documentation

## 2020-05-15 DIAGNOSIS — I10 Essential (primary) hypertension: Secondary | ICD-10-CM

## 2020-05-15 DIAGNOSIS — Z9861 Coronary angioplasty status: Secondary | ICD-10-CM | POA: Insufficient documentation

## 2020-05-15 DIAGNOSIS — Z5111 Encounter for antineoplastic chemotherapy: Secondary | ICD-10-CM | POA: Insufficient documentation

## 2020-05-15 DIAGNOSIS — I2510 Atherosclerotic heart disease of native coronary artery without angina pectoris: Secondary | ICD-10-CM | POA: Insufficient documentation

## 2020-05-15 DIAGNOSIS — E1165 Type 2 diabetes mellitus with hyperglycemia: Secondary | ICD-10-CM | POA: Insufficient documentation

## 2020-05-15 DIAGNOSIS — Z8679 Personal history of other diseases of the circulatory system: Secondary | ICD-10-CM

## 2020-05-15 DIAGNOSIS — C642 Malignant neoplasm of left kidney, except renal pelvis: Secondary | ICD-10-CM

## 2020-05-15 DIAGNOSIS — G939 Disorder of brain, unspecified: Secondary | ICD-10-CM

## 2020-05-15 DIAGNOSIS — I130 Hypertensive heart and chronic kidney disease with heart failure and stage 1 through stage 4 chronic kidney disease, or unspecified chronic kidney disease: Secondary | ICD-10-CM | POA: Insufficient documentation

## 2020-05-15 DIAGNOSIS — Z79899 Other long term (current) drug therapy: Secondary | ICD-10-CM | POA: Insufficient documentation

## 2020-05-15 DIAGNOSIS — E1122 Type 2 diabetes mellitus with diabetic chronic kidney disease: Secondary | ICD-10-CM | POA: Insufficient documentation

## 2020-05-15 DIAGNOSIS — E785 Hyperlipidemia, unspecified: Secondary | ICD-10-CM | POA: Insufficient documentation

## 2020-05-15 DIAGNOSIS — Z5112 Encounter for antineoplastic immunotherapy: Secondary | ICD-10-CM

## 2020-05-15 DIAGNOSIS — I255 Ischemic cardiomyopathy: Secondary | ICD-10-CM | POA: Insufficient documentation

## 2020-05-15 LAB — CBC WITH DIFFERENTIAL/PLATELET
Abs Immature Granulocytes: 0.01 10*3/uL (ref 0.00–0.07)
Basophils Absolute: 0 10*3/uL (ref 0.0–0.1)
Basophils Relative: 1 %
Eosinophils Absolute: 0.2 10*3/uL (ref 0.0–0.5)
Eosinophils Relative: 3 %
HCT: 40 % (ref 39.0–52.0)
Hemoglobin: 12.6 g/dL — ABNORMAL LOW (ref 13.0–17.0)
Immature Granulocytes: 0 %
Lymphocytes Relative: 24 %
Lymphs Abs: 1.9 10*3/uL (ref 0.7–4.0)
MCH: 28.6 pg (ref 26.0–34.0)
MCHC: 31.5 g/dL (ref 30.0–36.0)
MCV: 90.9 fL (ref 80.0–100.0)
Monocytes Absolute: 0.8 10*3/uL (ref 0.1–1.0)
Monocytes Relative: 10 %
Neutro Abs: 5 10*3/uL (ref 1.7–7.7)
Neutrophils Relative %: 62 %
Platelets: 180 10*3/uL (ref 150–400)
RBC: 4.4 MIL/uL (ref 4.22–5.81)
RDW: 16.3 % — ABNORMAL HIGH (ref 11.5–15.5)
WBC: 7.9 10*3/uL (ref 4.0–10.5)
nRBC: 0 % (ref 0.0–0.2)

## 2020-05-15 LAB — COMPREHENSIVE METABOLIC PANEL
ALT: 16 U/L (ref 0–44)
AST: 22 U/L (ref 15–41)
Albumin: 3.9 g/dL (ref 3.5–5.0)
Alkaline Phosphatase: 70 U/L (ref 38–126)
Anion gap: 13 (ref 5–15)
BUN: 29 mg/dL — ABNORMAL HIGH (ref 6–20)
CO2: 23 mmol/L (ref 22–32)
Calcium: 9.1 mg/dL (ref 8.9–10.3)
Chloride: 103 mmol/L (ref 98–111)
Creatinine, Ser: 2.06 mg/dL — ABNORMAL HIGH (ref 0.61–1.24)
GFR, Estimated: 37 mL/min — ABNORMAL LOW (ref >60)
Glucose, Bld: 144 mg/dL — ABNORMAL HIGH (ref 70–99)
Potassium: 4.4 mmol/L (ref 3.5–5.1)
Sodium: 139 mmol/L (ref 135–145)
Total Bilirubin: 0.8 mg/dL (ref 0.3–1.2)
Total Protein: 6.7 g/dL (ref 6.5–8.1)

## 2020-05-15 MED ORDER — SODIUM CHLORIDE 0.9 % IV SOLN
200.0000 mg | Freq: Once | INTRAVENOUS | Status: AC
  Administered 2020-05-15: 200 mg via INTRAVENOUS
  Filled 2020-05-15: qty 8

## 2020-05-15 MED ORDER — SODIUM CHLORIDE 0.9 % IV SOLN
Freq: Once | INTRAVENOUS | Status: AC
  Filled 2020-05-15: qty 250

## 2020-05-15 NOTE — Patient Instructions (Signed)
[facility]  Discharge Instructions: Thank you for choosing [facility] to provide your oncology and hematology care.  If you have a lab appointment with the [facility], please go directly to the [facility] and check in at the registration area.  Wear comfortable clothing and clothing appropriate for easy access to any Portacath or PICC line.   We strive to give you quality time with your provider. You may need to reschedule your appointment if you arrive late (15 or more minutes).  Arriving late affects you and other patients whose appointments are after yours.  Also, if you miss three or more appointments without notifying the office, you may be dismissed from the clinic at the provider's discretion.      For prescription refill requests, have your pharmacy contact our office and allow 72 hours for refills to be completed.    Today you received the following chemotherapy and/or immunotherapy agents Keytruda      To help prevent nausea and vomiting after your treatment, we encourage you to take your nausea medication as directed.  BELOW ARE SYMPTOMS THAT SHOULD BE REPORTED IMMEDIATELY: . *FEVER GREATER THAN 100.4 F (38 C) OR HIGHER . *CHILLS OR SWEATING . *NAUSEA AND VOMITING THAT IS NOT CONTROLLED WITH YOUR NAUSEA MEDICATION . *UNUSUAL SHORTNESS OF BREATH . *UNUSUAL BRUISING OR BLEEDING . *URINARY PROBLEMS (pain or burning when urinating, or frequent urination) . *BOWEL PROBLEMS (unusual diarrhea, constipation, pain near the anus) . TENDERNESS IN MOUTH AND THROAT WITH OR WITHOUT PRESENCE OF ULCERS (sore throat, sores in mouth, or a toothache) . UNUSUAL RASH, SWELLING OR PAIN  . UNUSUAL VAGINAL DISCHARGE OR ITCHING   Items with * indicate a potential emergency and should be followed up as soon as possible or go to the Emergency Department if any problems should occur.  Please show the CHEMOTHERAPY ALERT CARD or IMMUNOTHERAPY ALERT  CARD at check-in to the Emergency Department and triage nurse.  Should you have questions after your visit or need to cancel or reschedule your appointment, please contact [facility]  [phone] and follow the prompts.  Office hours are 8:00 a.m. to 4:30 p.m. Monday - Friday. Please note that voicemails left after 4:00 p.m. may not be returned until the following business day.  We are closed weekends and major holidays. You have access to a nurse at all times for urgent questions. Please call the main number to the clinic [phone] and follow the prompts.  For any non-urgent questions, you may also contact your provider using MyChart. We now offer e-Visits for anyone 4 and older to request care online for non-urgent symptoms. For details visit [email or web address].   Also download the MyChart app! Go to the app store, search "MyChart", open the app, select [facility], and log in with your MyChart username and password.  Due to Covid, a mask is required upon entering the hospital/clinic. If you do not have a mask, one will be given to you upon arrival. For doctor visits, patients may have 1 support person aged 72 or older with them. For treatment visits, patients cannot have anyone with them due to current Covid guidelines and our immunocompromised population. Pembrolizumab injection Qu es este medicamento? El PEMBROLIZUMAB es un anticuerpo monoclonal. [place] para tratar ciertos tipos de cncer. Este medicamento puede ser utilizado para otros usos; si tiene alguna pregunta consulte con su proveedor de atencin mdica o con su farmacutico. MARCAS COMUNES: Redgie Grayer  le debo informar a mi profesional de la salud antes de tomar este medicamento? Necesitan saber si usted presenta alguno de los siguientes problemas o situaciones: enfermedades autoinmunes tales como enfermedad de Crohn, colitis ulcerativa o lupus ha tenido o planea tener un trasplante  alognico de Social research officer, government ([place] las clulas madre de Theatre manager persona) antecedentes de trasplante de rganos antecedentes de radiacin en el pecho problemas del sistema nervioso, tales como miastenia grave o sndrome de Curator una reaccin alrgica o inusual al pembrolizumab, a otros medicamentos, alimentos, colorantes o conservantes si est embarazada o buscando quedar embarazada si est amamantando a un beb Cmo debo utilizar este medicamento? Este medicamento se administra mediante infusin en una vena. Lo administra un profesional de Technical sales engineer en un hospital o en un entorno clnico. Se le entregar una Gu[place] del medicamento (MedGuide, su nombre en ingls) especial antes de cada tratamiento. Asegrese de leer esta informacin cada vez cuidadosamente. Hable con su pediatra para informarse acerca del uso de este medicamento en nios. Aunque este medicamento se puede recetar a nios tan pequeos como de 6 meses de edad con ciertas afecciones, existen precauciones que deben tomarse. Sobredosis: Pngase en contacto inmediatamente con un centro toxicolgico o una sala de urgencia si usted cree que haya tomado demasiado medicamento. ATENCIN: [employer] es solo para usted. No comparta este medicamento con nadie. Qu sucede si me olvido de una dosis? Es importante no olvidar ninguna dosis. Informe a su mdico o a su profesional de la salud si no puede asistir a Photographer. Qu puede interactuar con este medicamento? No se han estudiado las interacciones. Puede ser que esta lista no menciona todas las posibles interacciones. Informe a su profesional de [employer]	[place] de [employer] productos a base de hierbas, medicamentos de [place] o suplementos nutritivos que est tomando. Si usted fuma, consume bebidas alcohlicas o si utiliza drogas ilegales, indqueselo tambin a su profesional de [employer]	[place]. Algunas sustancias pueden interactuar con su medicamento. A qu debo estar atento al usar [employer]? Se supervisar su estado de salud atentamente mientras reciba este medicamento. Usted podra necesitar realizarse [employer] de sangre mientras est usando [place]. No debe quedar embarazada mientras est usando este medicamento o por 4 meses despus de dejar de usarlo. Las mujeres deben informar a su mdico si estn buscando quedar embarazadas o si creen que podran estar embarazadas. Existe la posibilidad de efectos secundarios graves en un beb sin nacer. Para obtener ms informacin, hable con su profesional de la salud o su farmacutico. No debe amamantar a un beb mientras est usando este medicamento o durante 4 meses despus de la ltima dosis. Qu efectos secundarios puedo tener al [employer] este medicamento? Efectos secundarios que debe informar a su mdico o a Barrister's clerk de la salud tan pronto como sea posible: Chief of Staff, tales como erupcin cutnea, comezn/picazn o urticaria, e hinchazn de la cara, los labios o la lengua con sangre o de color negro y aspecto alquitranado problemas para respirar cambios en la visin dolor en el pecho escalofros confusin estreimiento tos diarrea mareo, sensacin de desmayo o aturdimiento frecuencia cardiaca rpida o irregular fiebre enrojecimiento dolor en las articulaciones recuentos sanguneos bajos: este medicamento podra reducir la cantidad de glbulos blancos, glbulos rojos y plaquetas. Su riesgo de infeccin y sangrado podra ser mayor. dolor muscular debilidad muscular dolor, hormigueo o entumecimiento de las manos o los pies dolor de cabeza persistente enrojecimiento, formacin de ampollas, descamacin o distensin de la piel, incluso dentro  de la boca signos y sntomas de niveles elevados de azcar en la sangre, tales como mareo; boca seca; piel seca; aliento frutal; nuseas; dolor de estmago; aumento del apetito o la sed; aumento de la frecuencia urinaria signos y sntomas de lesin renal, tales como dificultad  para Garment/textile technologist o cambios en la cantidad de orina signos y sntomas de lesin en el hgado, como orina oscura, heces claras, prdida del apetito, nuseas, dolor en la regin abdominal superior derecha, color amarillento de los ojos o la piel sudoracin ganglios linfticos inflamados prdida de peso Efectos secundarios que generalmente no requieren atencin mdica (infrmelos a su mdico o a su profesional de la salud si persisten o si son molestos): disminucin del apetito cada del cabello cansancio Puede ser que esta lista no menciona todos los posibles efectos secundarios. Comunquese a su mdico por asesoramiento mdico [employer] Usted puede informar los efectos secundarios a la FDA por telfono al 1-800-FDA-1088. Dnde debo guardar mi medicina? Este medicamento se administra en hospitales o clnicas, y no necesitar guardarlo en su domicilio. ATENCIN: Este folleto es un resumen. Puede ser que no cubra toda la posible informacin. Si usted tiene preguntas acerca de esta medicina, consulte con su mdico, su farmacutico o su profesional de Technical sales engineer.  2021 Elsevier/[employer] Standard (2019-05-05 00:00:00)

## 2020-05-15 NOTE — Progress Notes (Signed)
Pt here for follow up. No new concerns voiced.   

## 2020-05-15 NOTE — Progress Notes (Signed)
Hematology/Oncology Progress Note [facility] [phone] [phone]  Patient Care Team: Center, [facility] as PCP - General (General Practice)   Name of the patient: Mark Donaldson  [id number]  1960-12-01  Date of visit: 05/15/20   INTERVAL HISTORY-  60 y.o. male presents for follow-up of renal cell carcinoma treatments. Patient has past medical history CAD status post PCI to LAD in 2018, on aspirin and Plavix, CHF, history of focal sclerosing/crescentic GN-ANCA positive and CKD stage III. He was hospitalized from 05/02/2019-05/07/2019 due to gross hematuria. CT showed a 7.4 cm left lower pole renal mass concerning for RCC.  Patient also has necrotic lymphadenopathy in the mediastinum and the right supra hilar region which were highly suspicious for metastatic disease.  Bilateral lung nodules, also concerning for metastatic lung disease.  Right retrocrural lymphadenopathy with upper normal left periaortic lymph node. Patient was recommended for cytoreductive radical nephrectomy.  With history of ANCA positive vasculitis, also recommend thoracic lymphadenopathy biopsy to confirm distal metastasis.  Patient agreed to radical nephrectomy and declined bronchoscopy biopsy. He underwent left radical nephrectomy on 05/05/2019. Pathology showed pT3a pNx, RCC, conventional clear cell type, grade 3 Patient was discharged home. Today he presents to discuss pathology and the management plan. He was accompanied by his wife and daughter. Patient reports that he uses hydrocodone for pain.  Pain around the surgical site has improved.  He occasionally has tingling sensation around the surgical site.  Denies any fever or chills, drainage from the surgical site.  # Interval CT chest abdomen pelvis was independently reviewed by me and discussed with patient. 01/12/2020, CT showed overall marked improvement.  Comparing to CT scan in August 2021,  continues to have treatment response.Mediastinal soft tissue nodule nearly completely resolved, right minor fissure nodule 8 mm, decreased in size.  Resolution of pleural fluid and thickening of the right chest.  Small pleural effusion in the left chest.  No new lung nodules.-Subtle variation of the muscle architecture of the left psoas muscle of unknown significance.-Continue observation.  He is asymptomatic. Subtle nodule in the soft tissue of the left flank.  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 He finished 14 days of albendazole plus praziquantel .   He has had few side effects including poor appetite, hair loss, weight loss, bloody stool, abdominal cramping or diarrhea. All symptoms have improved since he completed the treatment. 03/16/2020 he finished tapering course of steroids  # It has been noted that his glucose level has been elevated in December 2021, even before he was started on prednisone treatments. Dr. Steva Ready has obtained A1c which came back elevated at 8.7.  # 04/30/2020, CT chest abdomen pelvis without contrast showed stable disease.   #04/30/2020, axitinib and immunotherapy were held due to diarrhea and fatigue. #05/15/2020 resumed on immunotherapy.   INTERVAL HISTORY Mark Donaldson is a 60 y.o. male who has above history reviewed by me today presents for follow up visit for management of metastatic RCC Problems and complaints are listed below: Patient is Spanish-speaking.  Spanish interpreter present for the entire encounter for translation Patient was recommended to hold off axitinib and immunotherapy 2 weeks ago due to diarrhea and fatigue. Patient reports feeling much better today.  Diarrhea has completely resolved after taking 1 dose of Imodium.  C. difficile negative GI panel negative. No new complaints today.    Review of systems- Review of Systems  Constitutional: Negative for appetite change, chills, fatigue, fever and unexpected  weight change.  HENT:   Negative for voice change.   Eyes: Negative for eye problems and icterus.  Respiratory: Negative for chest tightness and cough.        Occasionally SOB at night  Cardiovascular: Negative for chest pain and leg swelling.  Gastrointestinal: Negative for abdominal distention, abdominal pain and diarrhea.  Endocrine: Negative for hot flashes.  Genitourinary: Negative for difficulty urinating, dysuria and frequency.   Musculoskeletal: Negative for arthralgias.  Skin: Negative for itching and rash.       Hair loss  Neurological: Negative for light-headedness and numbness.  Hematological: Negative for adenopathy. Does not bruise/bleed easily.  Psychiatric/Behavioral: Negative for confusion.    No Known Allergies  Patient Active Problem List   Diagnosis Date Noted  . History of CHF (congestive heart failure) 05/01/2020  . Hyperglycemia 02/28/2020  . Stage 3b chronic kidney disease ([place]) 10/04/2019  . Brain lesion 10/04/2019  . Encounter for antineoplastic immunotherapy 07/19/2019  . Encounter for antineoplastic chemotherapy 07/19/2019  . CNS lesion 07/07/2019  . Anemia in stage 3b chronic kidney disease ([place]) 06/07/2019  . Goals of care, counseling/discussion 05/23/2019  . Renal cell carcinoma of left kidney ([place]) 05/23/2019  . Renal cell carcinoma ([place]) 05/23/2019  . Palliative care encounter   . History of vasculitis   . Thoracic lymphadenopathy   . Lung nodule   . CAD S/P percutaneous coronary angioplasty 05/02/2019  . Kidney mass 05/02/2019  . Hydronephrosis of left kidney 05/02/2019  . UTI (urinary tract infection) 05/02/2019  . AKI (acute kidney injury) ([place]) 05/02/2019  . Gross hematuria 05/02/2019  . Chronic diastolic CHF (congestive heart failure) ([place]) 05/02/2019  . Sepsis ([place]) 05/02/2019  . Severe sepsis ([place]) 05/02/2019  . Preop cardiovascular exam   . Hyperlipidemia 05/21/2016  . 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 Prediabetes 05/21/2016  . Alcohol abuse 05/18/2016  . Essential hypertension 05/18/2016  . Vasculitis, 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[place]) 05/18/2016  . Shortness of breath 05/17/2016     Past Medical History:  Diagnosis Date  . ANCA-positive vasculitis ([place])   . CAD (coronary artery disease)   . Essential hypertension   . HFrEF (heart failure with reduced ejection fraction) ([place])   . Hyperlipidemia LDL goal <70   . Ischemic cardiomyopathy   . Renal cell carcinoma ([place]) 05/23/2019  . Renal disorder      Past Surgical History:  Procedure Laterality Date  . CARDIAC SURGERY     cardiac cath with 2 stent placement  . LAPAROSCOPIC NEPHRECTOMY, HAND ASSISTED Left 05/05/2019   Procedure: HAND ASSISTED LAPAROSCOPIC NEPHRECTOMY;  Surgeon: Hollice Espy, MD;  Location: [facility] ORS;  Service: Urology;  Laterality: Left;    Social History   Socioeconomic History  . Marital status: Married    Spouse name: Not on file  . Number of children: Not on file  . Years of education: Not on file  . Highest education level: Not on file  Occupational History  . Not on file  Tobacco Use  . Smoking status: Never Smoker  . Smokeless tobacco: Never Used  Substance and Sexual Activity  . Alcohol use: Yes  . Drug use: Not on file  . Sexual activity: Not on file  Other Topics Concern  . Not on file  Social History Narrative  . Not on file   Social Determinants of Health   Financial Resource Strain: Not on file  Food Insecurity: Not on file  Transportation Needs: Not on file  Physical  Activity: Not on file  Stress: Not on file  Social Connections: Not on file  Intimate Partner Violence: Not on file     Family History  Problem Relation Age of Onset  . Cancer Mother   . Blindness Brother      Current Outpatient Medications:  .  aspirin 81 MG EC tablet, Take 81 mg by mouth daily. Swallow whole., Disp: , Rfl:  .  atorvastatin (LIPITOR) 80 MG tablet, Take 80 mg by mouth daily., Disp: ,  Rfl:  .  axitinib (INLYTA) 5 MG tablet, Take 1 tablet (5 mg total) by mouth 2 (two) times daily., Disp: 60 tablet, Rfl: 3 .  clopidogrel (PLAVIX) 75 MG tablet, Take 75 mg by mouth daily., Disp: , Rfl:  .  furosemide (LASIX) 20 MG tablet, Take 1 tablet (20 mg total) by mouth daily., Disp: 30 tablet, Rfl: 3 .  KEYTRUDA 100 MG/4ML SOLN, INJECT 200MG  INTRAVENOUSLY EVERY 3 WEEKS, Disp: 8 mL, Rfl: 11 .  lisinopril (ZESTRIL) 10 MG tablet, Take 10 mg by mouth daily., Disp: , Rfl:  .  loperamide (IMODIUM) 2 MG capsule, Take 1 capsule (2 mg total) by mouth See admin instructions. Take 2 tablets with onset of diarrhea, then 1 tablet for every 2 hours until diarrhea stops. Maximum 8 tablets per 24 hours., Disp: 60 capsule, Rfl: 0 .  metoprolol succinate (TOPROL-XL) 100 MG 24 hr tablet, Take 1.5 tablets by mouth daily., Disp: , Rfl:    Physical exam:  There were no vitals filed for this visit. Physical Exam Constitutional:      General: He is not in acute distress.    Appearance: He is not diaphoretic.  HENT:     Head: Normocephalic and atraumatic.     Nose: Nose normal.     Mouth/Throat:     Pharynx: No oropharyngeal exudate.  Eyes:     General: No scleral icterus.    Pupils: Pupils are equal, round, and reactive to light.  Cardiovascular:     Rate and Rhythm: Normal rate and regular rhythm.     Heart sounds: No murmur heard.   Pulmonary:     Effort: Pulmonary effort is normal. No respiratory distress.     Breath sounds: No rales.  Chest:     Chest wall: No tenderness.  Abdominal:     General: There is no distension.     Palpations: Abdomen is soft.     Tenderness: There is no abdominal tenderness.  Musculoskeletal:        General: Normal range of motion.     Cervical back: Normal range of motion and neck supple.  Skin:    General: Skin is warm and dry.     Findings: No erythema.  Neurological:     Mental Status: He is alert and oriented to person, place, and time.     Cranial  Nerves: No cranial nerve deficit.     Motor: No abnormal muscle tone.     Coordination: Coordination normal.  Psychiatric:        Mood and Affect: Affect normal.        CMP Latest Ref Rng & Units 05/01/2020  Glucose 70 - 99 mg/dL 115(H)  BUN 6 - 20 mg/dL 20  Creatinine 0.61 - 1.24 mg/dL 1.86(H)  Sodium 135 - 145 mmol/L 140  Potassium 3.5 - 5.1 mmol/L 4.1  Chloride 98 - 111 mmol/L 105  CO2 22 - 32 mmol/L 28  Calcium 8.9 - 10.3 mg/dL 9.9  Total  Protein 6.5 - 8.1 g/dL 7.3  Total Bilirubin 0.3 - 1.2 mg/dL 1.2  Alkaline Phos 38 - 126 U/L 79  AST 15 - 41 U/L 33  ALT 0 - 44 U/L 17   CBC Latest Ref Rng & Units 05/15/2020  WBC 4.0 - 10.5 K/uL 7.9  Hemoglobin 13.0 - 17.0 g/dL 12.6(L)  Hematocrit 39.0 - 52.0 % 40.0  Platelets 150 - 400 K/uL 180    RADIOGRAPHIC STUDIES: I have personally reviewed the radiological images as listed and agreed with the findings in the report. CT CHEST ABDOMEN PELVIS WO CONTRAST  Result Date: 04/30/2020 CLINICAL DATA:  History of renal cell carcinoma status post left nephrectomy April 2021. Ongoing therapy. EXAM: CT CHEST, ABDOMEN AND PELVIS WITHOUT CONTRAST TECHNIQUE: Multidetector CT imaging of the chest, abdomen and pelvis was performed following the standard protocol without IV contrast. COMPARISON:  Multiple priors hemispheric most recent CT chest abdomen pelvis January 12, 2020. FINDINGS: CT CHEST FINDINGS Cardiovascular: No thoracic aortic aneurysm. No aortic atherosclerosis. Three-vessel coronary artery calcifications. Normal size heart. No significant pericardial effusion/thickening. Mediastinum/Nodes: No pathologically enlarged mediastinal hilar lymph nodes. Similar size of the paramediastinal soft tissue nodularity for instance a small soft tissue nodule along the right anterior mediastinal margin measures 4 mm on image 22/2, unchanged. No new or enlarging lesions. Thyroid glands grossly unremarkable. Trachea esophagus are grossly unremarkable.  Lungs/Pleura: Unchanged size of the 8 mm pulmonary nodule along the minor fissure in the right chest on image 75/4. No new suspicious pulmonary nodules or masses. No focal consolidations. No pleural effusion. No pneumothorax. Musculoskeletal: Multilevel degenerative changes spine. No aggressive lytic or blastic lesion of bone. CT ABDOMEN PELVIS FINDINGS Hepatobiliary: Unremarkable noncontrast appearance of the hepatic parenchyma. Gallbladder is grossly unremarkable. No biliary ductal dilation. Pancreas: Within normal limits. Spleen: Within normal limits. Adrenals/Urinary Tract: Bilateral adrenal glands are unremarkable. Status post left nephrectomy without new suspicious soft tissue nodularity in the nephrectomy bed. No right-sided hydronephrosis or contour deforming renal mass. Urinary bladder is grossly unremarkable for degree of distension. Stomach/Bowel: Stomach is grossly unremarkable for degree of distension. Normal positioning of the duodenum/ligament of [place]. No suspicious small bowel dilation. Unremarkable appearance of the appendix and terminal ileum. Radio opaque contrast traverses the rectum. Left-sided colonic diverticulosis without findings of acute diverticulitis. Vascular/Lymphatic: No significant vascular findings are present. No gastrohepatic or hepatoduodenal ligament lymphadenopathy. No retroperitoneal or mesenteric lymphadenopathy. No pelvic sidewall lymphadenopathy. No groin lymphadenopathy. Reproductive: Mildly enlarged prostate. Other: No abdominopelvic ascites. No discrete omental or mesenteric nodularity. Musculoskeletal: Multilevel degenerative changes spine. Avascular necrosis of bilateral femoral heads, similar prior. No aggressive lytic or blastic lesion of bone. Previously described subtle asymmetry of the right psoas musculature is not evident on today's examination. IMPRESSION: 1. Stable exam. Status post left nephrectomy without evidence recurrence. No evidence of new or enlarging  metastatic disease in the chest, abdomen or pelvis. 2. Previously described subtle asymmetry of the right psoas musculature is not evident on today's examination. 3. Left-sided colonic diverticulosis without findings of acute diverticulitis. 4. Avascular necrosis of bilateral femoral heads, similar over multiple priors. Electronically Signed   By: Dahlia Bailiff MD   On: 04/30/2020 12:19    Assessment and plan-  Patient is a 60 y.o. male history of CAD, status post stent, CHF, history of ANCA positive sclerosing/crescentic GN, chronic kidney disease stage III presents for treatments of stage IV renal cell carcinoma 1. Renal cell carcinoma of left kidney (HCC)   2. Encounter for antineoplastic immunotherapy  3. History of CHF (congestive heart failure)   4. Primary hypertension   5. Brain lesion   6. Stage 3b chronic kidney disease ([place])   Cancer Staging Renal cell carcinoma of left kidney ([place]) Staging form: Kidney, AJCC 8th Edition - Pathologic: Stage IV (pT3a, pNX, cM1) - Signed by Earlie Server, MD on 05/23/2019   # Stage IV renal cell carcinoma Labs reviewed and discussed with patient .  Counts acceptable.  Diarrhea has resolved.  Proceed with Keytruda today Recommend patient to continue hold axitinib.  If no diarrhea at the next visit, will resume axitinib.  #Hypertension, CHF,  Continue metoprolol, lisinopril.  Lasix BP is stable today.   #Intermittent orthopnea, chronic issue for him. Pre-existing diastolic CHF, 08/25/7515 LVEF 60 to 65%.  Continue Lasix 20 mg daily. Recommend him to follow-up with cardiology-he is has not able to follow-up with cardiology due to lack of insurance coverage.  #Brain lesion  neurocysticercosis finished therapy.  Follow-up with ID  #CKD, creatinine stable with stable creatinine.  Encourage oral hydration.  Avoid nephrotoxin.  #Diabetes, A1c 8.7.  I will defer diabetes medication to primary care provider.  Monitor glucose level.  Spanish interpreter  presented for the entire encounter for translation. We spent sufficient time to discuss many aspect of care, questions were answered to patient's satisfaction. Follow-up in 3 weeks for follow-up of evaluation prior to immunotherapy.  Earlie Server, MD, PhD Hematology Oncology [facility] Pager- [phone] 05/15/2020

## 2020-06-05 ENCOUNTER — Encounter: Payer: Self-pay | Admitting: Oncology

## 2020-06-05 ENCOUNTER — Inpatient Hospital Stay (HOSPITAL_BASED_OUTPATIENT_CLINIC_OR_DEPARTMENT_OTHER): Payer: Self-pay | Admitting: Oncology

## 2020-06-05 ENCOUNTER — Inpatient Hospital Stay: Payer: Self-pay

## 2020-06-05 VITALS — BP 111/79 | HR 58 | Temp 97.5°F | Resp 18 | Wt 155.4 lb

## 2020-06-05 DIAGNOSIS — I10 Essential (primary) hypertension: Secondary | ICD-10-CM

## 2020-06-05 DIAGNOSIS — N1832 Chronic kidney disease, stage 3b: Secondary | ICD-10-CM

## 2020-06-05 DIAGNOSIS — C642 Malignant neoplasm of left kidney, except renal pelvis: Secondary | ICD-10-CM

## 2020-06-05 DIAGNOSIS — Z5112 Encounter for antineoplastic immunotherapy: Secondary | ICD-10-CM

## 2020-06-05 DIAGNOSIS — Z8679 Personal history of other diseases of the circulatory system: Secondary | ICD-10-CM

## 2020-06-05 LAB — CBC WITH DIFFERENTIAL/PLATELET
Abs Immature Granulocytes: 0.01 10*3/uL (ref 0.00–0.07)
Basophils Absolute: 0 10*3/uL (ref 0.0–0.1)
Basophils Relative: 1 %
Eosinophils Absolute: 0.2 10*3/uL (ref 0.0–0.5)
Eosinophils Relative: 3 %
HCT: 38.9 % — ABNORMAL LOW (ref 39.0–52.0)
Hemoglobin: 12.3 g/dL — ABNORMAL LOW (ref 13.0–17.0)
Immature Granulocytes: 0 %
Lymphocytes Relative: 23 %
Lymphs Abs: 1.7 10*3/uL (ref 0.7–4.0)
MCH: 29.1 pg (ref 26.0–34.0)
MCHC: 31.6 g/dL (ref 30.0–36.0)
MCV: 92.2 fL (ref 80.0–100.0)
Monocytes Absolute: 1 10*3/uL (ref 0.1–1.0)
Monocytes Relative: 13 %
Neutro Abs: 4.5 10*3/uL (ref 1.7–7.7)
Neutrophils Relative %: 60 %
Platelets: 166 10*3/uL (ref 150–400)
RBC: 4.22 MIL/uL (ref 4.22–5.81)
RDW: 14.6 % (ref 11.5–15.5)
WBC: 7.4 10*3/uL (ref 4.0–10.5)
nRBC: 0 % (ref 0.0–0.2)

## 2020-06-05 LAB — COMPREHENSIVE METABOLIC PANEL
ALT: 18 U/L (ref 0–44)
AST: 21 U/L (ref 15–41)
Albumin: 4 g/dL (ref 3.5–5.0)
Alkaline Phosphatase: 76 U/L (ref 38–126)
Anion gap: 8 (ref 5–15)
BUN: 31 mg/dL — ABNORMAL HIGH (ref 6–20)
CO2: 25 mmol/L (ref 22–32)
Calcium: 9.2 mg/dL (ref 8.9–10.3)
Chloride: 108 mmol/L (ref 98–111)
Creatinine, Ser: 1.86 mg/dL — ABNORMAL HIGH (ref 0.61–1.24)
GFR, Estimated: 41 mL/min — ABNORMAL LOW (ref >60)
Glucose, Bld: 82 mg/dL (ref 70–99)
Potassium: 5 mmol/L (ref 3.5–5.1)
Sodium: 141 mmol/L (ref 135–145)
Total Bilirubin: 0.8 mg/dL (ref 0.3–1.2)
Total Protein: 6.6 g/dL (ref 6.5–8.1)

## 2020-06-05 MED ORDER — SODIUM CHLORIDE 0.9 % IV SOLN
Freq: Once | INTRAVENOUS | Status: AC
  Filled 2020-06-05: qty 250

## 2020-06-05 MED ORDER — AXITINIB 5 MG PO TABS: 5.0000 mg | ORAL_TABLET | Freq: Two times a day (BID) | ORAL | 3 refills | Status: DC

## 2020-06-05 MED ORDER — SODIUM CHLORIDE 0.9 % IV SOLN
200.0000 mg | Freq: Once | INTRAVENOUS | Status: AC
  Administered 2020-06-05: 200 mg via INTRAVENOUS
  Filled 2020-06-05: qty 8

## 2020-06-05 NOTE — Progress Notes (Signed)
Pt here for follow up. No new concerns voiced.   

## 2020-06-05 NOTE — Progress Notes (Signed)
Hematology/Oncology Progress Note [facility] Telephone:([phone] [phone]  Patient Care Team: Center, [facility] as PCP - General (General Practice)   Name of the patient: Mark Donaldson  [id number]  1960-06-20  Date of visit: 06/05/20   INTERVAL HISTORY-  60 y.o. male presents for follow-up of renal cell carcinoma treatments. Patient has past medical history CAD status post PCI to LAD in 2018, on aspirin and Plavix, CHF, history of focal sclerosing/crescentic GN-ANCA positive and CKD stage III. He was hospitalized from 05/02/2019-05/07/2019 due to gross hematuria. CT showed a 7.4 cm left lower pole renal mass concerning for RCC.  Patient also has necrotic lymphadenopathy in the mediastinum and the right supra hilar region which were highly suspicious for metastatic disease.  Bilateral lung nodules, also concerning for metastatic lung disease.  Right retrocrural lymphadenopathy with upper normal left periaortic lymph node. Patient was recommended for cytoreductive radical nephrectomy.  With history of ANCA positive vasculitis, also recommend thoracic lymphadenopathy biopsy to confirm distal metastasis.  Patient agreed to radical nephrectomy and declined bronchoscopy biopsy. He underwent left radical nephrectomy on 05/05/2019. Pathology showed pT3a pNx, RCC, conventional clear cell type, grade 3 Patient was discharged home. Today he presents to discuss pathology and the management plan. He was accompanied by his wife and daughter. Patient reports that he uses hydrocodone for pain.  Pain around the surgical site has improved.  He occasionally has tingling sensation around the surgical site.  Denies any fever or chills, drainage from the surgical site.  # Interval CT chest abdomen pelvis was independently reviewed by me and discussed with patient. 01/12/2020, CT showed overall marked improvement.  Comparing to CT scan in August 2021,  continues to have treatment response.Mediastinal soft tissue nodule nearly completely resolved, right minor fissure nodule 8 mm, decreased in size.  Resolution of pleural fluid and thickening of the right chest.  Small pleural effusion in the left chest.  No new lung nodules.-Subtle variation of the muscle architecture of the left psoas muscle of unknown significance.-Continue observation.  He is asymptomatic. Subtle nodule in the soft tissue of the left flank.  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 He finished 14 days of albendazole plus praziquantel .   He has had few side effects including poor appetite, hair loss, weight loss, bloody stool, abdominal cramping or diarrhea. All symptoms have improved since he completed the treatment. 03/16/2020 he finished tapering course of steroids  # It has been noted that his glucose level has been elevated in December 2021, even before he was started on prednisone treatments. Dr. Steva Ready has obtained A1c which came back elevated at 8.7.  # 04/30/2020, CT chest abdomen pelvis without contrast showed stable disease.   #04/30/2020, axitinib and immunotherapy were held due to diarrhea and fatigue. #05/15/2020 resumed on immunotherapy.   INTERVAL HISTORY Mark Donaldson is a 60 y.o. male who has above history reviewed by me today presents for follow up visit for management of metastatic RCC Problems and complaints are listed below: Patient is Spanish-speaking.  Spanish interpreter present for the entire encounter for translation Patient reports feeling well no nausea no vomiting diarrhea, he has gained 5 pounds since last visit.  No new complaints.    Review of systems- Review of Systems  Constitutional: Negative for appetite change, chills, fatigue, fever and unexpected weight change.  HENT:   Negative for voice change.   Eyes: Negative for eye problems and icterus.  Respiratory: Negative for chest tightness  and cough.    Cardiovascular: Negative for chest pain and leg swelling.  Gastrointestinal: Negative for abdominal distention, abdominal pain and diarrhea.  Endocrine: Negative for hot flashes.  Genitourinary: Negative for difficulty urinating, dysuria and frequency.   Musculoskeletal: Negative for arthralgias.  Skin: Negative for itching and rash.       Hair loss  Neurological: Negative for light-headedness and numbness.  Hematological: Negative for adenopathy. Does not bruise/bleed easily.  Psychiatric/Behavioral: Negative for confusion.    No Known Allergies  Patient Active Problem List   Diagnosis Date Noted  . History of CHF (congestive heart failure) 05/01/2020  . Hyperglycemia 02/28/2020  . Stage 3b chronic kidney disease ([place]) 10/04/2019  . Brain lesion 10/04/2019  . Encounter for antineoplastic immunotherapy 07/19/2019  . Encounter for antineoplastic chemotherapy 07/19/2019  . CNS lesion 07/07/2019  . Anemia in stage 3b chronic kidney disease ([place]) 06/07/2019  . Goals of care, counseling/discussion 05/23/2019  . Renal cell carcinoma of left kidney ([place]) 05/23/2019  . Renal cell carcinoma ([place]) 05/23/2019  . Palliative care encounter   . History of vasculitis   . Thoracic lymphadenopathy   . Lung nodule   . CAD S/P percutaneous coronary angioplasty 05/02/2019  . Kidney mass 05/02/2019  . Hydronephrosis of left kidney 05/02/2019  . UTI (urinary tract infection) 05/02/2019  . AKI (acute kidney injury) ([place]) 05/02/2019  . Gross hematuria 05/02/2019  . Chronic diastolic CHF (congestive heart failure) ([place]) 05/02/2019  . Sepsis ([place]) 05/02/2019  . Severe sepsis ([place]) 05/02/2019  . Preop cardiovascular exam   . Hyperlipidemia 05/21/2016  . 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 Prediabetes 05/21/2016  . Alcohol abuse 05/18/2016  . Essential hypertension 05/18/2016  . Vasculitis, 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[place]) 05/18/2016  . Shortness of breath 05/17/2016      Past Medical History:  Diagnosis Date  . ANCA-positive vasculitis ([place])   . CAD (coronary artery disease)   . Essential hypertension   . HFrEF (heart failure with reduced ejection fraction) ([place])   . Hyperlipidemia LDL goal <70   . Ischemic cardiomyopathy   . Renal cell carcinoma ([place]) 05/23/2019  . Renal disorder      Past Surgical History:  Procedure Laterality Date  . CARDIAC SURGERY     cardiac cath with 2 stent placement  . LAPAROSCOPIC NEPHRECTOMY, HAND ASSISTED Left 05/05/2019   Procedure: HAND ASSISTED LAPAROSCOPIC NEPHRECTOMY;  Surgeon: Hollice Espy, MD;  Location: [facility] ORS;  Service: Urology;  Laterality: Left;    Social History   Socioeconomic History  . Marital status: Married    Spouse name: Not on file  . Number of children: Not on file  . Years of education: Not on file  . Highest education level: Not on file  Occupational History  . Not on file  Tobacco Use  . Smoking status: Never Smoker  . Smokeless tobacco: Never Used  Substance and Sexual Activity  . Alcohol use: Yes  . Drug use: Not on file  . Sexual activity: Not on file  Other Topics Concern  . Not on file  Social History Narrative  . Not on file   Social Determinants of Health   Financial Resource Strain: Not on file  Food Insecurity: Not on file  Transportation Needs: Not on file  Physical Activity: Not on file  Stress: Not on file  Social Connections: Not on file  Intimate Partner Violence: Not on file     Family History  Problem Relation Age of Onset  .  Cancer Mother   . Blindness Brother      Current Outpatient Medications:  .  aspirin 81 MG EC tablet, Take 81 mg by mouth daily. Swallow whole., Disp: , Rfl:  .  atorvastatin (LIPITOR) 80 MG tablet, Take 80 mg by mouth daily., Disp: , Rfl:  .  axitinib (INLYTA) 5 MG tablet, Take 1 tablet (5 mg total) by mouth 2 (two) times daily. (Patient not taking: Reported on 05/15/2020), Disp: 60 tablet, Rfl: 3 .  clopidogrel  (PLAVIX) 75 MG tablet, Take 75 mg by mouth daily., Disp: , Rfl:  .  furosemide (LASIX) 20 MG tablet, Take 1 tablet (20 mg total) by mouth daily., Disp: 30 tablet, Rfl: 3 .  KEYTRUDA 100 MG/4ML SOLN, INJECT 200MG  INTRAVENOUSLY EVERY 3 WEEKS, Disp: 8 mL, Rfl: 11 .  lisinopril (ZESTRIL) 10 MG tablet, Take 10 mg by mouth daily., Disp: , Rfl:  .  loperamide (IMODIUM) 2 MG capsule, Take 1 capsule (2 mg total) by mouth See admin instructions. Take 2 tablets with onset of diarrhea, then 1 tablet for every 2 hours until diarrhea stops. Maximum 8 tablets per 24 hours. (Patient not taking: Reported on 05/15/2020), Disp: 60 capsule, Rfl: 0 .  metoprolol succinate (TOPROL-XL) 100 MG 24 hr tablet, Take 1.5 tablets by mouth daily., Disp: , Rfl:    Physical exam:  Vitals:   06/05/20 1342  BP: 111/79  Pulse: (!) 58  Resp: 18  Temp: (!) 97.5 F (36.4 C)  Weight: 155 lb 6.4 oz (70.5 kg)   Physical Exam Constitutional:      General: He is not in acute distress.    Appearance: He is not diaphoretic.  HENT:     Head: Normocephalic and atraumatic.     Nose: Nose normal.     Mouth/Throat:     Pharynx: No oropharyngeal exudate.  Eyes:     General: No scleral icterus.    Pupils: Pupils are equal, round, and reactive to light.  Cardiovascular:     Rate and Rhythm: Normal rate and regular rhythm.     Heart sounds: No murmur heard.   Pulmonary:     Effort: Pulmonary effort is normal. No respiratory distress.     Breath sounds: No rales.  Chest:     Chest wall: No tenderness.  Abdominal:     General: There is no distension.     Palpations: Abdomen is soft.     Tenderness: There is no abdominal tenderness.  Musculoskeletal:        General: Normal range of motion.     Cervical back: Normal range of motion and neck supple.  Skin:    General: Skin is warm and dry.     Findings: No erythema.  Neurological:     Mental Status: He is alert and oriented to person, place, and time.     Cranial Nerves: No  cranial nerve deficit.     Motor: No abnormal muscle tone.     Coordination: Coordination normal.  Psychiatric:        Mood and Affect: Affect normal.        CMP Latest Ref Rng & Units 05/15/2020  Glucose 70 - 99 mg/dL 144(H)  BUN 6 - 20 mg/dL 29(H)  Creatinine 0.61 - 1.24 mg/dL 2.06(H)  Sodium 135 - 145 mmol/L 139  Potassium 3.5 - 5.1 mmol/L 4.4  Chloride 98 - 111 mmol/L 103  CO2 22 - 32 mmol/L 23  Calcium 8.9 - 10.3 mg/dL 9.1  Total  Protein 6.5 - 8.1 g/dL 6.7  Total Bilirubin 0.3 - 1.2 mg/dL 0.8  Alkaline Phos 38 - 126 U/L 70  AST 15 - 41 U/L 22  ALT 0 - 44 U/L 16   CBC Latest Ref Rng & Units 05/15/2020  WBC 4.0 - 10.5 K/uL 7.9  Hemoglobin 13.0 - 17.0 g/dL 12.6(L)  Hematocrit 39.0 - 52.0 % 40.0  Platelets 150 - 400 K/uL 180    RADIOGRAPHIC STUDIES: I have personally reviewed the radiological images as listed and agreed with the findings in the report. CT CHEST ABDOMEN PELVIS WO CONTRAST  Result Date: 04/30/2020 CLINICAL DATA:  History of renal cell carcinoma status post left nephrectomy April 2021. Ongoing therapy. EXAM: CT CHEST, ABDOMEN AND PELVIS WITHOUT CONTRAST TECHNIQUE: Multidetector CT imaging of the chest, abdomen and pelvis was performed following the standard protocol without IV contrast. COMPARISON:  Multiple priors hemispheric most recent CT chest abdomen pelvis January 12, 2020. FINDINGS: CT CHEST FINDINGS Cardiovascular: No thoracic aortic aneurysm. No aortic atherosclerosis. Three-vessel coronary artery calcifications. Normal size heart. No significant pericardial effusion/thickening. Mediastinum/Nodes: No pathologically enlarged mediastinal hilar lymph nodes. Similar size of the paramediastinal soft tissue nodularity for instance a small soft tissue nodule along the right anterior mediastinal margin measures 4 mm on image 22/2, unchanged. No new or enlarging lesions. Thyroid glands grossly unremarkable. Trachea esophagus are grossly unremarkable. Lungs/Pleura:  Unchanged size of the 8 mm pulmonary nodule along the minor fissure in the right chest on image 75/4. No new suspicious pulmonary nodules or masses. No focal consolidations. No pleural effusion. No pneumothorax. Musculoskeletal: Multilevel degenerative changes spine. No aggressive lytic or blastic lesion of bone. CT ABDOMEN PELVIS FINDINGS Hepatobiliary: Unremarkable noncontrast appearance of the hepatic parenchyma. Gallbladder is grossly unremarkable. No biliary ductal dilation. Pancreas: Within normal limits. Spleen: Within normal limits. Adrenals/Urinary Tract: Bilateral adrenal glands are unremarkable. Status post left nephrectomy without new suspicious soft tissue nodularity in the nephrectomy bed. No right-sided hydronephrosis or contour deforming renal mass. Urinary bladder is grossly unremarkable for degree of distension. Stomach/Bowel: Stomach is grossly unremarkable for degree of distension. Normal positioning of the duodenum/ligament of [place]. No suspicious small bowel dilation. Unremarkable appearance of the appendix and terminal ileum. Radio opaque contrast traverses the rectum. Left-sided colonic diverticulosis without findings of acute diverticulitis. Vascular/Lymphatic: No significant vascular findings are present. No gastrohepatic or hepatoduodenal ligament lymphadenopathy. No retroperitoneal or mesenteric lymphadenopathy. No pelvic sidewall lymphadenopathy. No groin lymphadenopathy. Reproductive: Mildly enlarged prostate. Other: No abdominopelvic ascites. No discrete omental or mesenteric nodularity. Musculoskeletal: Multilevel degenerative changes spine. Avascular necrosis of bilateral femoral heads, similar prior. No aggressive lytic or blastic lesion of bone. Previously described subtle asymmetry of the right psoas musculature is not evident on today's examination. IMPRESSION: 1. Stable exam. Status post left nephrectomy without evidence recurrence. No evidence of new or enlarging metastatic  disease in the chest, abdomen or pelvis. 2. Previously described subtle asymmetry of the right psoas musculature is not evident on today's examination. 3. Left-sided colonic diverticulosis without findings of acute diverticulitis. 4. Avascular necrosis of bilateral femoral heads, similar over multiple priors. Electronically Signed   By: Dahlia Bailiff MD   On: 04/30/2020 12:19    Assessment and plan-  Patient is a 60 y.o. male history of CAD, status post stent, CHF, history of ANCA positive sclerosing/crescentic GN, chronic kidney disease stage III presents for treatments of stage IV renal cell carcinoma 1. Renal cell carcinoma of left kidney (HCC)   2. Encounter for antineoplastic immunotherapy  3. History of CHF (congestive heart failure)   4. Primary hypertension   5. Stage 3b chronic kidney disease ([place])   Cancer Staging Renal cell carcinoma of left kidney ([place]) Staging form: Kidney, AJCC 8th Edition - Pathologic: Stage IV (pT3a, pNX, cM1) - Signed by Earlie Server, MD on 05/23/2019   # Stage IV renal cell carcinoma Labs reviewed and discussed with patient. Acceptable to proceed with today's [facility] treatment .  Recommend patient to resume axitinib 5 mg twice daily.   #Hypertension, CHF,  Continue metoprolol, lisinopril.  Lasix BP is stable today.  #Intermittent orthopnea, chronic issue for him.  Symptom has improved.  Continue Lasix Pre-existing diastolic CHF, 03/01/4713 LVEF 60 to 65%.   Recommend him to follow-up with cardiology-he is has not able to follow-up with cardiology due to lack of insurance coverage.  #Brain lesion  neurocysticercosis finished therapy.  Follow-up with ID  #CKD, creatinine stable with stable creatinine.  Encourage oral hydration.  Avoid nephrotoxin.  #Diabetes, A1c 8.7.  I will defer diabetes medication to primary care provider.  Monitor glucose level.  Spanish interpreter presented for the entire encounter for translation. We spent sufficient time to  discuss many aspect of care, questions were answered to patient's satisfaction. Follow-up in 3 weeks for follow-up of evaluation prior to immunotherapy.  Earlie Server, MD, PhD Hematology Oncology [facility] Pager- [phone] 06/05/2020

## 2020-06-05 NOTE — Patient Instructions (Signed)
[facility]ER CENTER [place] [facility]  Discharge Instructions: Thank you for choosing [facility] to provide your oncology and hematology care.  If you have a lab appointment with the Cancer Center, please go directly to the Cancer Center and check in at the registration area.  Wear comfortable clothing and clothing appropriate for easy access to any Portacath or PICC line.   We strive to give you quality time with your provider. You may need to reschedule your appointment if you arrive late (15 or more minutes).  Arriving late affects you and other patients whose appointments are after yours.  Also, if you miss three or more appointments without notifying the office, you may be dismissed from the clinic at the provider's discretion.      For prescription refill requests, have your pharmacy contact our office and allow 72 hours for refills to be completed.    Today you received the following chemotherapy and/or immunotherapy agents : Keytruda    To help prevent nausea and vomiting after your treatment, we encourage you to take your nausea medication as directed.  BELOW ARE SYMPTOMS THAT SHOULD BE REPORTED IMMEDIATELY: *FEVER GREATER THAN 100.4 F (38 C) OR HIGHER *CHILLS OR SWEATING *NAUSEA AND VOMITING THAT IS NOT CONTROLLED WITH YOUR NAUSEA MEDICATION *UNUSUAL SHORTNESS OF BREATH *UNUSUAL BRUISING OR BLEEDING *URINARY PROBLEMS (pain or burning when urinating, or frequent urination) *BOWEL PROBLEMS (unusual diarrhea, constipation, pain near the anus) TENDERNESS IN MOUTH AND THROAT WITH OR WITHOUT PRESENCE OF ULCERS (sore throat, sores in mouth, or a toothache) UNUSUAL RASH, SWELLING OR PAIN  UNUSUAL VAGINAL DISCHARGE OR ITCHING   Items with * indicate a potential emergency and should be followed up as soon as possible or go to the Emergency Department if any problems should occur.  Please show the CHEMOTHERAPY ALERT CARD or IMMUNOTHERAPY ALERT CARD at check-in  to the Emergency Department and triage nurse.  Should you have questions after your visit or need to cancel or reschedule your appointment, please contact [facility] CENTER [place] [facility]  [phone] and follow the prompts.  Office hours are 8:00 a.m. to 4:30 p.m. Monday - Friday. Please note that voicemails left after 4:00 p.m. may not be returned until the following business day.  We are closed weekends and major holidays. You have access to a nurse at all times for urgent questions. Please call the main number to the clinic [phone] and follow the prompts.  For any non-urgent questions, you may also contact your provider using MyChart. We now offer e-Visits for anyone 18 and older to request care online for non-urgent symptoms. For details visit [email or web address].   Also download the MyChart app! Go to the app store, search "MyChart", open the app, select [facility], and log in with your MyChart username and password.  Due to Covid, a mask is required upon entering the hospital/clinic. If you do not have a mask, one will be given to you upon arrival. For doctor visits, patients may have 1 support person aged 18 or older with them. For treatment visits, patients cannot have anyone with them due to current Covid guidelines and our immunocompromised population.  

## 2020-06-26 ENCOUNTER — Encounter: Payer: Self-pay | Admitting: Oncology

## 2020-06-26 ENCOUNTER — Inpatient Hospital Stay: Payer: Self-pay

## 2020-06-26 ENCOUNTER — Inpatient Hospital Stay (HOSPITAL_BASED_OUTPATIENT_CLINIC_OR_DEPARTMENT_OTHER): Payer: Self-pay | Admitting: Oncology

## 2020-06-26 ENCOUNTER — Inpatient Hospital Stay: Payer: Self-pay | Attending: Oncology

## 2020-06-26 ENCOUNTER — Other Ambulatory Visit: Payer: Self-pay

## 2020-06-26 VITALS — BP 133/95 | HR 65 | Temp 96.3°F | Resp 18 | Wt 152.3 lb

## 2020-06-26 DIAGNOSIS — I10 Essential (primary) hypertension: Secondary | ICD-10-CM

## 2020-06-26 DIAGNOSIS — Z5112 Encounter for antineoplastic immunotherapy: Secondary | ICD-10-CM | POA: Insufficient documentation

## 2020-06-26 DIAGNOSIS — N1832 Chronic kidney disease, stage 3b: Secondary | ICD-10-CM

## 2020-06-26 DIAGNOSIS — I5042 Chronic combined systolic (congestive) and diastolic (congestive) heart failure: Secondary | ICD-10-CM | POA: Insufficient documentation

## 2020-06-26 DIAGNOSIS — N179 Acute kidney failure, unspecified: Secondary | ICD-10-CM

## 2020-06-26 DIAGNOSIS — Z79899 Other long term (current) drug therapy: Secondary | ICD-10-CM | POA: Insufficient documentation

## 2020-06-26 DIAGNOSIS — Z8679 Personal history of other diseases of the circulatory system: Secondary | ICD-10-CM

## 2020-06-26 DIAGNOSIS — C642 Malignant neoplasm of left kidney, except renal pelvis: Secondary | ICD-10-CM | POA: Insufficient documentation

## 2020-06-26 DIAGNOSIS — I130 Hypertensive heart and chronic kidney disease with heart failure and stage 1 through stage 4 chronic kidney disease, or unspecified chronic kidney disease: Secondary | ICD-10-CM | POA: Insufficient documentation

## 2020-06-26 LAB — COMPREHENSIVE METABOLIC PANEL
ALT: 17 U/L (ref 0–44)
AST: 23 U/L (ref 15–41)
Albumin: 4.2 g/dL (ref 3.5–5.0)
Alkaline Phosphatase: 88 U/L (ref 38–126)
Anion gap: 9 (ref 5–15)
BUN: 47 mg/dL — ABNORMAL HIGH (ref 6–20)
CO2: 21 mmol/L — ABNORMAL LOW (ref 22–32)
Calcium: 9.3 mg/dL (ref 8.9–10.3)
Chloride: 107 mmol/L (ref 98–111)
Creatinine, Ser: 2.29 mg/dL — ABNORMAL HIGH (ref 0.61–1.24)
GFR, Estimated: 32 mL/min — ABNORMAL LOW (ref >60)
Glucose, Bld: 113 mg/dL — ABNORMAL HIGH (ref 70–99)
Potassium: 4.9 mmol/L (ref 3.5–5.1)
Sodium: 137 mmol/L (ref 135–145)
Total Bilirubin: 0.5 mg/dL (ref 0.3–1.2)
Total Protein: 7.1 g/dL (ref 6.5–8.1)

## 2020-06-26 LAB — CBC WITH DIFFERENTIAL/PLATELET
Abs Immature Granulocytes: 0.02 10*3/uL (ref 0.00–0.07)
Basophils Absolute: 0 10*3/uL (ref 0.0–0.1)
Basophils Relative: 1 %
Eosinophils Absolute: 0.3 10*3/uL (ref 0.0–0.5)
Eosinophils Relative: 4 %
HCT: 42.3 % (ref 39.0–52.0)
Hemoglobin: 14.1 g/dL (ref 13.0–17.0)
Immature Granulocytes: 0 %
Lymphocytes Relative: 34 %
Lymphs Abs: 2.8 10*3/uL (ref 0.7–4.0)
MCH: 29.1 pg (ref 26.0–34.0)
MCHC: 33.3 g/dL (ref 30.0–36.0)
MCV: 87.4 fL (ref 80.0–100.0)
Monocytes Absolute: 0.9 10*3/uL (ref 0.1–1.0)
Monocytes Relative: 11 %
Neutro Abs: 4.1 10*3/uL (ref 1.7–7.7)
Neutrophils Relative %: 50 %
Platelets: 175 10*3/uL (ref 150–400)
RBC: 4.84 MIL/uL (ref 4.22–5.81)
RDW: 12.7 % (ref 11.5–15.5)
WBC: 8.1 10*3/uL (ref 4.0–10.5)
nRBC: 0 % (ref 0.0–0.2)

## 2020-06-26 MED ORDER — SODIUM CHLORIDE 0.9 % IV SOLN
Freq: Once | INTRAVENOUS | Status: AC
  Filled 2020-06-26: qty 250

## 2020-06-26 MED ORDER — SODIUM CHLORIDE 0.9 % IV SOLN
200.0000 mg | Freq: Once | INTRAVENOUS | Status: AC
  Administered 2020-06-26: 200 mg via INTRAVENOUS
  Filled 2020-06-26: qty 8

## 2020-06-26 NOTE — Progress Notes (Signed)
Hematology/Oncology Progress Note [facility] ([place]) Telephone:([phone] [phone]  Patient Care Team: Center, [facility] as PCP - General (General Practice)   Name of the patient: Mark Donaldson  [id number]  [birth year]  Date of visit: 06/26/20   INTERVAL HISTORY-  60 y.o. male presents for follow-up of renal cell carcinoma treatments. Patient has past medical history CAD status post PCI to LAD in 2018, on aspirin and Plavix, CHF, history of focal sclerosing/crescentic GN-ANCA positive and CKD stage III. He was hospitalized from 05/02/2019-05/07/2019 due to gross hematuria. CT showed a 7.4 cm left lower pole renal mass concerning for RCC.  Patient also has necrotic lymphadenopathy in the mediastinum and the right supra hilar region which were highly suspicious for metastatic disease.  Bilateral lung nodules, also concerning for metastatic lung disease.  Right retrocrural lymphadenopathy with upper normal left periaortic lymph node. Patient was recommended for cytoreductive radical nephrectomy.  With history of ANCA positive vasculitis, also recommend thoracic lymphadenopathy biopsy to confirm distal metastasis.  Patient agreed to radical nephrectomy and declined bronchoscopy biopsy. He underwent left radical nephrectomy on 05/05/2019. Pathology showed pT3a pNx, RCC, conventional clear cell type, grade 3 Patient was discharged home. Today he presents to discuss pathology and the management plan. He was accompanied by his wife and daughter. Patient reports that he uses hydrocodone for pain.  Pain around the surgical site has improved.  He occasionally has tingling sensation around the surgical site.  Denies any fever or chills, drainage from the surgical site.  # Interval CT chest abdomen pelvis was independently reviewed by me and discussed with patient. 01/12/2020, CT showed overall marked improvement.  Comparing to CT scan in August 2021,  continues to have treatment response.Mediastinal soft tissue nodule nearly completely resolved, right minor fissure nodule 8 mm, decreased in size.  Resolution of pleural fluid and thickening of the right chest.  Small pleural effusion in the left chest.  No new lung nodules.-Subtle variation of the muscle architecture of the left psoas muscle of unknown significance.-Continue observation.  He is asymptomatic. Subtle nodule in the soft tissue of the left flank.  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 He finished 14 days of albendazole plus praziquantel .   He has had few side effects including poor appetite, hair loss, weight loss, bloody stool, abdominal cramping or diarrhea. All symptoms have improved since he completed the treatment. 03/16/2020 he finished tapering course of steroids  # It has been noted that his glucose level has been elevated in December 2021, even before he was started on prednisone treatments. Dr. Steva Ready has obtained A1c which came back elevated at 8.7.  # 04/30/2020, CT chest abdomen pelvis without contrast showed stable disease.   #04/30/2020, axitinib and immunotherapy were held due to diarrhea and fatigue. #05/15/2020 resumed on immunotherapy.   INTERVAL HISTORY Mark Donaldson is a 60 y.o. male who has above history reviewed by me today presents for follow up visit for management of metastatic RCC Problems and complaints are listed below: Patient is Spanish-speaking.  Spanish interpreter present for the entire encounter for translation Patient reports feeling well no nausea no vomiting diarrhea,  Appetite is fair. Very mild intermittent loose stool episodes, he did not need to use any diarrhea medication.    Review of systems- Review of Systems  Constitutional:  Negative for appetite change, chills, fatigue, fever and unexpected weight change.  HENT:   Negative for voice change.   Eyes:  Negative for eye  problems and icterus.   Respiratory:  Negative for chest tightness and cough.   Cardiovascular:  Negative for chest pain and leg swelling.  Gastrointestinal:  Negative for abdominal distention, abdominal pain and diarrhea.  Endocrine: Negative for hot flashes.  Genitourinary:  Negative for difficulty urinating, dysuria and frequency.   Musculoskeletal:  Negative for arthralgias.  Skin:  Negative for itching and rash.  Neurological:  Negative for light-headedness and numbness.  Hematological:  Negative for adenopathy. Does not bruise/bleed easily.  Psychiatric/Behavioral:  Negative for confusion.    No Known Allergies  Patient Active Problem List   Diagnosis Date Noted   History of CHF (congestive heart failure) 05/01/2020   Hyperglycemia 02/28/2020   Stage 3b chronic kidney disease ([place]) 10/04/2019   Brain lesion 10/04/2019   Encounter for antineoplastic immunotherapy 07/19/2019   Encounter for antineoplastic chemotherapy 07/19/2019   CNS lesion 07/07/2019   Anemia in stage 3b chronic kidney disease ([place]) 06/07/2019   Goals of care, counseling/discussion 05/23/2019   Renal cell carcinoma of left kidney ([place]) 05/23/2019   Renal cell carcinoma ([place]) 05/23/2019   Palliative care encounter    History of vasculitis    Thoracic lymphadenopathy    Lung nodule    CAD S/P percutaneous coronary angioplasty 05/02/2019   Kidney mass 05/02/2019   Hydronephrosis of left kidney 05/02/2019   UTI (urinary tract infection) 05/02/2019   AKI (acute kidney injury) ([place]) 05/02/2019   Gross hematuria 05/02/2019   Chronic diastolic CHF (congestive heart failure) ([place]) 05/02/2019   Sepsis ([place]) 05/02/2019   Severe sepsis ([place]) 05/02/2019   Preop cardiovascular exam    Hyperlipidemia 05/21/2016   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 ([place]) 05/21/2016   Prediabetes 05/21/2016   Alcohol abuse 05/18/2016   Essential hypertension 05/18/2016   Vasculitis, 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[place]) 05/18/2016   Shortness of breath  05/17/2016     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HCC)    CAD (coronary artery disease)    Essential hypertension    HFrEF (heart failure with reduced ejection fraction) (HCC)    Hyperlipidemia LDL goal <70    Ischemic cardiomyopathy    Renal cell carcinoma ([place]) 05/23/2019   Renal disorder      Past Surgical History:  Procedure Laterality Date   CARDIAC SURGERY     cardiac cath with 2 stent placement   LAPAROSCOPIC NEPHRECTOMY, HAND ASSISTED Left 05/05/2019   Procedure: HAND ASSISTED LAPAROSCOPIC NEPHRECTOMY;  Surgeon: Hollice Espy, MD;  Location: [facility] ORS;  Service: Urology;  Laterality: Left;    Social History   Socioeconomic History   Marital status: Married    Spouse name: Not on file   Number of children: Not on file   Years of education: Not on file   Highest education level: Not on file  Occupational History   Not on file  Tobacco Use   Smoking status: Never   Smokeless tobacco: Never  Substance and Sexual Activity   Alcohol use: Yes   Drug use: Not on file   Sexual activity: Not on file  Other Topics Concern   Not on file  Social History Narrative   Not on file   Social Determinants of Health   Financial Resource Strain: Not on file  Food Insecurity: Not on file  Transportation Needs: Not on file  Physical Activity: Not on file  Stress: Not on file  Social Connections: Not on file  Intimate Partner Violence: Not on file  Family History  Problem Relation Age of Onset   Cancer Mother    Blindness Brother      Current Outpatient Medications:    aspirin 81 MG EC tablet, Take 81 mg by mouth daily. Swallow whole., Disp: , Rfl:    atorvastatin (LIPITOR) 80 MG tablet, Take 80 mg by mouth daily., Disp: , Rfl:    axitinib (INLYTA) 5 MG tablet, Take 1 tablet (5 mg total) by mouth 2 (two) times daily., Disp: 60 tablet, Rfl: 3   clopidogrel (PLAVIX) 75 MG tablet, Take 75 mg by mouth daily., Disp: , Rfl:    furosemide  (LASIX) 20 MG tablet, Take 1 tablet (20 mg total) by mouth daily., Disp: 30 tablet, Rfl: 3   KEYTRUDA 100 MG/4ML SOLN, INJECT 200MG  INTRAVENOUSLY EVERY 3 WEEKS, Disp: 8 mL, Rfl: 11   lisinopril (ZESTRIL) 10 MG tablet, Take 10 mg by mouth daily., Disp: , Rfl:    metoprolol succinate (TOPROL-XL) 100 MG 24 hr tablet, Take 1.5 tablets by mouth daily., Disp: , Rfl:    loperamide (IMODIUM) 2 MG capsule, Take 1 capsule (2 mg total) by mouth See admin instructions. Take 2 tablets with onset of diarrhea, then 1 tablet for every 2 hours until diarrhea stops. Maximum 8 tablets per 24 hours. (Patient not taking: No sig reported), Disp: 60 capsule, Rfl: 0   Physical exam:  Vitals:   06/26/20 0915  BP: (!) 133/95  Pulse: 65  Resp: 18  Temp: (!) 96.3 F (35.7 C)  Weight: 152 lb 4.8 oz (69.1 kg)   Physical Exam Constitutional:      General: He is not in acute distress.    Appearance: He is not diaphoretic.  HENT:     Head: Normocephalic and atraumatic.     Nose: Nose normal.     Mouth/Throat:     Pharynx: No oropharyngeal exudate.  Eyes:     General: No scleral icterus.    Pupils: Pupils are equal, round, and reactive to light.  Cardiovascular:     Rate and Rhythm: Normal rate and regular rhythm.     Heart sounds: No murmur heard. Pulmonary:     Effort: Pulmonary effort is normal. No respiratory distress.     Breath sounds: No rales.  Chest:     Chest wall: No tenderness.  Abdominal:     General: There is no distension.     Palpations: Abdomen is soft.     Tenderness: There is no abdominal tenderness.  Musculoskeletal:        General: Normal range of motion.     Cervical back: Normal range of motion and neck supple.  Skin:    General: Skin is warm and dry.     Findings: No erythema.  Neurological:     Mental Status: He is alert and oriented to person, place, and time.     Cranial Nerves: No cranial nerve deficit.     Motor: No abnormal muscle tone.     Coordination: Coordination  normal.  Psychiatric:        Mood and Affect: Affect normal.       CMP Latest Ref Rng & Units 06/26/2020  Glucose 70 - 99 mg/dL 113(H)  BUN 6 - 20 mg/dL 47(H)  Creatinine 0.61 - 1.24 mg/dL 2.29(H)  Sodium 135 - 145 mmol/L 137  Potassium 3.5 - 5.1 mmol/L 4.9  Chloride 98 - 111 mmol/L 107  CO2 22 - 32 mmol/L 21(L)  Calcium 8.9 - 10.3 mg/dL 9.3  Total Protein 6.5 - 8.1 g/dL 7.1  Total Bilirubin 0.3 - 1.2 mg/dL 0.5  Alkaline Phos 38 - 126 U/L 88  AST 15 - 41 U/L 23  ALT 0 - 44 U/L 17   CBC Latest Ref Rng & Units 06/26/2020  WBC 4.0 - 10.5 K/uL 8.1  Hemoglobin 13.0 - 17.0 g/dL 14.1  Hematocrit 39.0 - 52.0 % 42.3  Platelets 150 - 400 K/uL 175    RADIOGRAPHIC STUDIES: I have personally reviewed the radiological images as listed and agreed with the findings in the report. CT CHEST ABDOMEN PELVIS WO CONTRAST  Result Date: 04/30/2020 CLINICAL DATA:  History of renal cell carcinoma status post left nephrectomy April 2021. Ongoing therapy. EXAM: CT CHEST, ABDOMEN AND PELVIS WITHOUT CONTRAST TECHNIQUE: Multidetector CT imaging of the chest, abdomen and pelvis was performed following the standard protocol without IV contrast. COMPARISON:  Multiple priors hemispheric most recent CT chest abdomen pelvis January 12, 2020. FINDINGS: CT CHEST FINDINGS Cardiovascular: No thoracic aortic aneurysm. No aortic atherosclerosis. Three-vessel coronary artery calcifications. Normal size heart. No significant pericardial effusion/thickening. Mediastinum/Nodes: No pathologically enlarged mediastinal hilar lymph nodes. Similar size of the paramediastinal soft tissue nodularity for instance a small soft tissue nodule along the right anterior mediastinal margin measures 4 mm on image 22/2, unchanged. No new or enlarging lesions. Thyroid glands grossly unremarkable. Trachea esophagus are grossly unremarkable. Lungs/Pleura: Unchanged size of the 8 mm pulmonary nodule along the minor fissure in the right chest on image  75/4. No new suspicious pulmonary nodules or masses. No focal consolidations. No pleural effusion. No pneumothorax. Musculoskeletal: Multilevel degenerative changes spine. No aggressive lytic or blastic lesion of bone. CT ABDOMEN PELVIS FINDINGS Hepatobiliary: Unremarkable noncontrast appearance of the hepatic parenchyma. Gallbladder is grossly unremarkable. No biliary ductal dilation. Pancreas: Within normal limits. Spleen: Within normal limits. Adrenals/Urinary Tract: Bilateral adrenal glands are unremarkable. Status post left nephrectomy without new suspicious soft tissue nodularity in the nephrectomy bed. No right-sided hydronephrosis or contour deforming renal mass. Urinary bladder is grossly unremarkable for degree of distension. Stomach/Bowel: Stomach is grossly unremarkable for degree of distension. Normal positioning of the duodenum/ligament of [place]. No suspicious small bowel dilation. Unremarkable appearance of the appendix and terminal ileum. Radio opaque contrast traverses the rectum. Left-sided colonic diverticulosis without findings of acute diverticulitis. Vascular/Lymphatic: No significant vascular findings are present. No gastrohepatic or hepatoduodenal ligament lymphadenopathy. No retroperitoneal or mesenteric lymphadenopathy. No pelvic sidewall lymphadenopathy. No groin lymphadenopathy. Reproductive: Mildly enlarged prostate. Other: No abdominopelvic ascites. No discrete omental or mesenteric nodularity. Musculoskeletal: Multilevel degenerative changes spine. Avascular necrosis of bilateral femoral heads, similar prior. No aggressive lytic or blastic lesion of bone. Previously described subtle asymmetry of the right psoas musculature is not evident on today's examination. IMPRESSION: 1. Stable exam. Status post left nephrectomy without evidence recurrence. No evidence of new or enlarging metastatic disease in the chest, abdomen or pelvis. 2. Previously described subtle asymmetry of the right  psoas musculature is not evident on today's examination. 3. Left-sided colonic diverticulosis without findings of acute diverticulitis. 4. Avascular necrosis of bilateral femoral heads, similar over multiple priors. Electronically Signed   By: Dahlia Bailiff MD   On: 04/30/2020 12:19     Assessment and plan-  Patient is a 60 y.o. male history of CAD, status post stent, CHF, history of ANCA positive sclerosing/crescentic GN, chronic kidney disease stage III presents for treatments of stage IV renal cell carcinoma 1. Renal cell carcinoma of left kidney (HCC)   2. Encounter for antineoplastic  immunotherapy   3. History of CHF (congestive heart failure)   4. Primary hypertension   5. Stage 3b chronic kidney disease ([place])   Cancer Staging Renal cell carcinoma of left kidney ([place]) Staging form: Kidney, AJCC 8th Edition - Pathologic: Stage IV (pT3a, pNX, cM1) - Signed by Earlie Server, MD on 05/23/2019   # Stage IV renal cell carcinoma Labs are reviewed and discussed with patient. Proceed with Keytruda today Continue axitinib 5 mg twice daily.   #Hypertension, CHF,  Continue metoprolol, lisinopril.  Lasix Blood pressure stable.  #Intermittent orthopnea, chronic issue for him.  Symptoms has resolved.  Continue Lasix Pre-existing diastolic CHF, 3/64/6803 LVEF 60 to 65%.   Recommend him to follow-up with cardiology-he is has not able to follow-up with cardiology due to lack of insurance coverage.  #Brain lesion  neurocysticercosis finished therapy.  Follow-up with ID  #CKD, creatinine stable with stable creatinine.  Encourage oral hydration.  Avoid nephrotoxin.  Creatinine is slightly higher than his baseline.  Proceed with 500 cc IV normal saline x1 today.   Spanish interpreter presented for the entire encounter for translation. We spent sufficient time to discuss many aspect of care, questions were answered to patient's satisfaction. Follow-up in 3 weeks for follow-up of evaluation prior to  immunotherapy.  Earlie Server, MD, PhD Hematology Oncology [facility] Pager- [phone] 06/26/2020

## 2020-06-26 NOTE — Progress Notes (Signed)
Patient here for follow. Pt reports having mild loose stools for the past couple days.

## 2020-06-26 NOTE — Patient Instructions (Signed)
[facility] ONCOLOGY  Discharge Instructions: Thank you for choosing [facility] to provide your oncology and hematology care.  If you have a lab appointment with the [facility], please go directly to the [facility] and check in at the registration area.  Wear comfortable clothing and clothing appropriate for easy access to any Portacath or PICC line.   We strive to give you quality time with your provider. You may need to reschedule your appointment if you arrive late (15 or more minutes).  Arriving late affects you and other patients whose appointments are after yours.  Also, if you miss three or more appointments without notifying the office, you may be dismissed from the clinic at the provider's discretion.      For prescription refill requests, have your pharmacy contact our office and allow 72 hours for refills to be completed.    Today you received the following chemotherapy and/or immunotherapy : Keytruda     To help prevent nausea and vomiting after your treatment, we encourage you to take your nausea medication as directed.  BELOW ARE SYMPTOMS THAT SHOULD BE REPORTED IMMEDIATELY: *FEVER GREATER THAN 100.4 F (38 C) OR HIGHER *CHILLS OR SWEATING *NAUSEA AND VOMITING THAT IS NOT CONTROLLED WITH YOUR NAUSEA MEDICATION *UNUSUAL SHORTNESS OF BREATH *UNUSUAL BRUISING OR BLEEDING *URINARY PROBLEMS (pain or burning when urinating, or frequent urination) *BOWEL PROBLEMS (unusual diarrhea, constipation, pain near the anus) TENDERNESS IN MOUTH AND THROAT WITH OR WITHOUT PRESENCE OF ULCERS (sore throat, sores in mouth, or a toothache) UNUSUAL RASH, SWELLING OR PAIN  UNUSUAL VAGINAL DISCHARGE OR ITCHING   Items with * indicate a potential emergency and should be followed up as soon as possible or go to the Emergency Department if any problems should occur.  Please show the CHEMOTHERAPY ALERT CARD or IMMUNOTHERAPY ALERT CARD at check-in to the  Emergency Department and triage nurse.  Should you have questions after your visit or need to cancel or reschedule your appointment, please contact [facility]  [phone] and follow the prompts.  Office hours are 8:00 a.m. to 4:30 p.m. Monday - Friday. Please note that voicemails left after 4:00 p.m. may not be returned until the following business day.  We are closed weekends and major holidays. You have access to a nurse at all times for urgent questions. Please call the main number to the clinic [phone] and follow the prompts.  For any non-urgent questions, you may also contact your provider using MyChart. We now offer e-Visits for anyone 80 and older to request care online for non-urgent symptoms. For details visit [email or web address].   Also download the MyChart app! Go to the app store, search "MyChart", open the app, select [facility], and log in with your MyChart username and password.  Due to Covid, a mask is required upon entering the hospital/clinic. If you do not have a mask, one will be given to you upon arrival. For doctor visits, patients may have 1 support person aged 19 or older with them. For treatment visits, patients cannot have anyone with them due to current Covid guidelines and our immunocompromised population.

## 2020-07-17 ENCOUNTER — Inpatient Hospital Stay: Payer: Self-pay

## 2020-07-17 ENCOUNTER — Inpatient Hospital Stay (HOSPITAL_BASED_OUTPATIENT_CLINIC_OR_DEPARTMENT_OTHER): Payer: Self-pay | Admitting: Oncology

## 2020-07-17 ENCOUNTER — Inpatient Hospital Stay: Payer: Self-pay | Attending: Oncology

## 2020-07-17 ENCOUNTER — Encounter: Payer: Self-pay | Admitting: Oncology

## 2020-07-17 VITALS — BP 133/91 | HR 74 | Temp 96.0°F | Resp 18 | Wt 150.0 lb

## 2020-07-17 DIAGNOSIS — R197 Diarrhea, unspecified: Secondary | ICD-10-CM | POA: Insufficient documentation

## 2020-07-17 DIAGNOSIS — C642 Malignant neoplasm of left kidney, except renal pelvis: Secondary | ICD-10-CM

## 2020-07-17 DIAGNOSIS — R110 Nausea: Secondary | ICD-10-CM | POA: Insufficient documentation

## 2020-07-17 DIAGNOSIS — Z9861 Coronary angioplasty status: Secondary | ICD-10-CM | POA: Insufficient documentation

## 2020-07-17 DIAGNOSIS — Z905 Acquired absence of kidney: Secondary | ICD-10-CM | POA: Insufficient documentation

## 2020-07-17 DIAGNOSIS — I2510 Atherosclerotic heart disease of native coronary artery without angina pectoris: Secondary | ICD-10-CM | POA: Insufficient documentation

## 2020-07-17 DIAGNOSIS — Z7901 Long term (current) use of anticoagulants: Secondary | ICD-10-CM | POA: Insufficient documentation

## 2020-07-17 DIAGNOSIS — I130 Hypertensive heart and chronic kidney disease with heart failure and stage 1 through stage 4 chronic kidney disease, or unspecified chronic kidney disease: Secondary | ICD-10-CM | POA: Insufficient documentation

## 2020-07-17 DIAGNOSIS — Z5112 Encounter for antineoplastic immunotherapy: Secondary | ICD-10-CM

## 2020-07-17 DIAGNOSIS — N1832 Chronic kidney disease, stage 3b: Secondary | ICD-10-CM | POA: Insufficient documentation

## 2020-07-17 DIAGNOSIS — I255 Ischemic cardiomyopathy: Secondary | ICD-10-CM | POA: Insufficient documentation

## 2020-07-17 DIAGNOSIS — Z79899 Other long term (current) drug therapy: Secondary | ICD-10-CM | POA: Insufficient documentation

## 2020-07-17 DIAGNOSIS — E785 Hyperlipidemia, unspecified: Secondary | ICD-10-CM | POA: Insufficient documentation

## 2020-07-17 DIAGNOSIS — I10 Essential (primary) hypertension: Secondary | ICD-10-CM

## 2020-07-17 DIAGNOSIS — Z8679 Personal history of other diseases of the circulatory system: Secondary | ICD-10-CM

## 2020-07-17 DIAGNOSIS — I5042 Chronic combined systolic (congestive) and diastolic (congestive) heart failure: Secondary | ICD-10-CM | POA: Insufficient documentation

## 2020-07-17 DIAGNOSIS — R918 Other nonspecific abnormal finding of lung field: Secondary | ICD-10-CM | POA: Insufficient documentation

## 2020-07-17 DIAGNOSIS — Z5111 Encounter for antineoplastic chemotherapy: Secondary | ICD-10-CM

## 2020-07-17 LAB — COMPREHENSIVE METABOLIC PANEL
ALT: 17 U/L (ref 0–44)
AST: 25 U/L (ref 15–41)
Albumin: 4.3 g/dL (ref 3.5–5.0)
Alkaline Phosphatase: 97 U/L (ref 38–126)
Anion gap: 9 (ref 5–15)
BUN: 41 mg/dL — ABNORMAL HIGH (ref 6–20)
CO2: 20 mmol/L — ABNORMAL LOW (ref 22–32)
Calcium: 9.5 mg/dL (ref 8.9–10.3)
Chloride: 107 mmol/L (ref 98–111)
Creatinine, Ser: 1.97 mg/dL — ABNORMAL HIGH (ref 0.61–1.24)
GFR, Estimated: 39 mL/min — ABNORMAL LOW (ref >60)
Glucose, Bld: 108 mg/dL — ABNORMAL HIGH (ref 70–99)
Potassium: 4.6 mmol/L (ref 3.5–5.1)
Sodium: 136 mmol/L (ref 135–145)
Total Bilirubin: 0.9 mg/dL (ref 0.3–1.2)
Total Protein: 7.4 g/dL (ref 6.5–8.1)

## 2020-07-17 LAB — CBC WITH DIFFERENTIAL/PLATELET
Abs Immature Granulocytes: 0.04 10*3/uL (ref 0.00–0.07)
Basophils Absolute: 0.1 10*3/uL (ref 0.0–0.1)
Basophils Relative: 0 %
Eosinophils Absolute: 0.4 10*3/uL (ref 0.0–0.5)
Eosinophils Relative: 3 %
HCT: 45.8 % (ref 39.0–52.0)
Hemoglobin: 15.4 g/dL (ref 13.0–17.0)
Immature Granulocytes: 0 %
Lymphocytes Relative: 16 %
Lymphs Abs: 2.1 10*3/uL (ref 0.7–4.0)
MCH: 29.1 pg (ref 26.0–34.0)
MCHC: 33.6 g/dL (ref 30.0–36.0)
MCV: 86.6 fL (ref 80.0–100.0)
Monocytes Absolute: 0.8 10*3/uL (ref 0.1–1.0)
Monocytes Relative: 6 %
Neutro Abs: 9.5 10*3/uL — ABNORMAL HIGH (ref 1.7–7.7)
Neutrophils Relative %: 75 %
Platelets: 177 10*3/uL (ref 150–400)
RBC: 5.29 MIL/uL (ref 4.22–5.81)
RDW: 12.9 % (ref 11.5–15.5)
WBC: 12.9 10*3/uL — ABNORMAL HIGH (ref 4.0–10.5)
nRBC: 0 % (ref 0.0–0.2)

## 2020-07-17 MED ORDER — SODIUM CHLORIDE 0.9% FLUSH
10.0000 mL | INTRAVENOUS | Status: DC | PRN
Start: 2020-07-17 — End: 2020-07-17
  Filled 2020-07-17: qty 10

## 2020-07-17 MED ORDER — SODIUM CHLORIDE 0.9 % IV SOLN
Freq: Once | INTRAVENOUS | Status: AC
Start: 2020-07-17 — End: 2020-07-17
  Filled 2020-07-17: qty 250

## 2020-07-17 MED ORDER — SODIUM CHLORIDE 0.9 % IV SOLN
200.0000 mg | Freq: Once | INTRAVENOUS | Status: AC
  Administered 2020-07-17: 200 mg via INTRAVENOUS
  Filled 2020-07-17: qty 8

## 2020-07-17 NOTE — Progress Notes (Signed)
Hematology/Oncology Progress Note [facility] [phone] [phone]  Patient Care Team: Center, [facility] as PCP - General (General Practice)   Name of the patient: Mark Donaldson  [id number]  Jul 03, 1960  Date of visit: 07/17/20   INTERVAL HISTORY-  60 y.o. male presents for follow-up of renal cell carcinoma treatments. Patient has past medical history CAD status post PCI to LAD in 2018, on aspirin and Plavix, CHF, history of focal sclerosing/crescentic GN-ANCA positive and CKD stage III. He was hospitalized from 05/02/2019-05/07/2019 due to gross hematuria. CT showed a 7.4 cm left lower pole renal mass concerning for RCC.  Patient also has necrotic lymphadenopathy in the mediastinum and the right supra hilar region which were highly suspicious for metastatic disease.  Bilateral lung nodules, also concerning for metastatic lung disease.  Right retrocrural lymphadenopathy with upper normal left periaortic lymph node. Patient was recommended for cytoreductive radical nephrectomy.  With history of ANCA positive vasculitis, also recommend thoracic lymphadenopathy biopsy to confirm distal metastasis.  Patient agreed to radical nephrectomy and declined bronchoscopy biopsy. He underwent left radical nephrectomy on 05/05/2019. Pathology showed pT3a pNx, RCC, conventional clear cell type, grade 3 Patient was discharged home. Today he presents to discuss pathology and the management plan. He was accompanied by his wife and daughter. Patient reports that he uses hydrocodone for pain.  Pain around the surgical site has improved.  He occasionally has tingling sensation around the surgical site.  Denies any fever or chills, drainage from the surgical site.  # Interval CT chest abdomen pelvis was independently reviewed by me and discussed with patient. 01/12/2020, CT showed overall marked improvement.  Comparing to CT scan in August 2021,  continues to have treatment response.Mediastinal soft tissue nodule nearly completely resolved, right minor fissure nodule 8 mm, decreased in size.  Resolution of pleural fluid and thickening of the right chest.  Small pleural effusion in the left chest.  No new lung nodules.-Subtle variation of the muscle architecture of the left psoas muscle of unknown significance.-Continue observation.  He is asymptomatic. Subtle nodule in the soft tissue of the left flank.  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 He finished 14 days of albendazole plus praziquantel .   He has had few side effects including poor appetite, hair loss, weight loss, bloody stool, abdominal cramping or diarrhea. All symptoms have improved since he completed the treatment. 03/16/2020 he finished tapering course of steroids  # It has been noted that his glucose level has been elevated in December 2021, even before he was started on prednisone treatments. Dr. Steva Ready has obtained A1c which came back elevated at 8.7.  # 04/30/2020, CT chest abdomen pelvis without contrast showed stable disease.   #04/30/2020, axitinib and immunotherapy were held due to diarrhea and fatigue. #05/15/2020 resumed on immunotherapy.   INTERVAL HISTORY Mark Donaldson is a 60 y.o. male who has above history reviewed by me today presents for follow up visit for management of metastatic RCC Problems and complaints are listed below: Patient is Spanish-speaking.  Spanish interpreter present for the entire encounter for translation Patient reports feeling well no nausea no vomiting diarrhea,  Appetite has decreased.  He has lost a few pounds.  Very mild intermittent loose stool episodes, 2-3 times per week.  He takes thyroid medication which helps the symptoms.    Review of systems- Review of Systems  Constitutional:  Negative for appetite change, chills, fatigue, fever and unexpected weight change.  HENT:  Negative for voice  change.   Eyes:  Negative for eye problems and icterus.  Respiratory:  Negative for chest tightness and cough.   Cardiovascular:  Negative for chest pain and leg swelling.  Gastrointestinal:  Negative for abdominal distention, abdominal pain and diarrhea.  Endocrine: Negative for hot flashes.  Genitourinary:  Negative for difficulty urinating, dysuria and frequency.   Musculoskeletal:  Negative for arthralgias.  Skin:  Negative for itching and rash.  Neurological:  Negative for light-headedness and numbness.  Hematological:  Negative for adenopathy. Does not bruise/bleed easily.  Psychiatric/Behavioral:  Negative for confusion.    No Known Allergies  Patient Active Problem List   Diagnosis Date Noted   History of CHF (congestive heart failure) 05/01/2020   Hyperglycemia 02/28/2020   Stage 3b chronic kidney disease ([place]) 10/04/2019   Brain lesion 10/04/2019   Encounter for antineoplastic immunotherapy 07/19/2019   Encounter for antineoplastic chemotherapy 07/19/2019   CNS lesion 07/07/2019   Anemia in stage 3b chronic kidney disease ([place]) 06/07/2019   Goals of care, counseling/discussion 05/23/2019   Renal cell carcinoma of left kidney ([place]) 05/23/2019   Renal cell carcinoma ([place]) 05/23/2019   Palliative care encounter    History of vasculitis    Thoracic lymphadenopathy    Lung nodule    CAD S/P percutaneous coronary angioplasty 05/02/2019   Kidney mass 05/02/2019   Hydronephrosis of left kidney 05/02/2019   UTI (urinary tract infection) 05/02/2019   AKI (acute kidney injury) ([place]) 05/02/2019   Gross hematuria 05/02/2019   Chronic diastolic CHF (congestive heart failure) ([place]) 05/02/2019   Sepsis ([place]) 05/02/2019   Severe sepsis ([place]) 05/02/2019   Preop cardiovascular exam    Hyperlipidemia 05/21/2016   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 ([place]) 05/21/2016   Prediabetes 05/21/2016   Alcohol abuse 05/18/2016   Essential hypertension 05/18/2016   Vasculitis,  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[place]) 05/18/2016   Shortness of breath 05/17/2016     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HCC)    CAD (coronary artery disease)    Essential hypertension    HFrEF (heart failure with reduced ejection fraction) (HCC)    Hyperlipidemia LDL goal <70    Ischemic cardiomyopathy    Renal cell carcinoma ([place]) 05/23/2019   Renal disorder      Past Surgical History:  Procedure Laterality Date   CARDIAC SURGERY     cardiac cath with 2 stent placement   LAPAROSCOPIC NEPHRECTOMY, HAND ASSISTED Left 05/05/2019   Procedure: HAND ASSISTED LAPAROSCOPIC NEPHRECTOMY;  Surgeon: Hollice Espy, MD;  Location: [facility] ORS;  Service: Urology;  Laterality: Left;    Social History   Socioeconomic History   Marital status: Married    Spouse name: Not on file   Number of children: Not on file   Years of education: Not on file   Highest education level: Not on file  Occupational History   Not on file  Tobacco Use   Smoking status: Never   Smokeless tobacco: Never  Substance and Sexual Activity   Alcohol use: Yes   Drug use: Not on file   Sexual activity: Not on file  Other Topics Concern   Not on file  Social History Narrative   Not on file   Social Determinants of Health   Financial Resource Strain: Not on file  Food Insecurity: Not on file  Transportation Needs: Not on file  Physical Activity: Not on file  Stress: Not on file  Social Connections: Not on file  Intimate Partner Violence: Not on file     Family History  Problem Relation Age of Onset   Cancer Mother    Blindness Brother      Current Outpatient Medications:    aspirin 81 MG EC tablet, Take 81 mg by mouth daily. Swallow whole., Disp: , Rfl:    atorvastatin (LIPITOR) 80 MG tablet, Take 80 mg by mouth daily., Disp: , Rfl:    axitinib (INLYTA) 5 MG tablet, Take 1 tablet (5 mg total) by mouth 2 (two) times daily., Disp: 60 tablet, Rfl: 3   clopidogrel (PLAVIX) 75 MG tablet,  Take 75 mg by mouth daily., Disp: , Rfl:    furosemide (LASIX) 20 MG tablet, Take 1 tablet (20 mg total) by mouth daily., Disp: 30 tablet, Rfl: 3   KEYTRUDA 100 MG/4ML SOLN, INJECT 200MG  INTRAVENOUSLY EVERY 3 WEEKS, Disp: 8 mL, Rfl: 11   lisinopril (ZESTRIL) 10 MG tablet, Take 10 mg by mouth daily., Disp: , Rfl:    loperamide (IMODIUM) 2 MG capsule, Take 1 capsule (2 mg total) by mouth See admin instructions. Take 2 tablets with onset of diarrhea, then 1 tablet for every 2 hours until diarrhea stops. Maximum 8 tablets per 24 hours., Disp: 60 capsule, Rfl: 0   metoprolol succinate (TOPROL-XL) 100 MG 24 hr tablet, Take 1.5 tablets by mouth daily., Disp: , Rfl:  No current facility-administered medications for this visit.  Facility-Administered Medications Ordered in Other Visits:    sodium chloride flush (NS) 0.9 % injection 10 mL, 10 mL, Intracatheter, PRN, Earlie Server, MD   Physical exam:  Vitals:   07/17/20 0908  BP: (!) 133/91  Pulse: 74  Resp: 18  Temp: (!) 96 F (35.6 C)  TempSrc: Tympanic  SpO2: 96%  Weight: 150 lb (68 kg)   Physical Exam Constitutional:      General: He is not in acute distress.    Appearance: He is not diaphoretic.  HENT:     Head: Normocephalic and atraumatic.     Nose: Nose normal.     Mouth/Throat:     Pharynx: No oropharyngeal exudate.  Eyes:     General: No scleral icterus.    Pupils: Pupils are equal, round, and reactive to light.  Cardiovascular:     Rate and Rhythm: Normal rate and regular rhythm.     Heart sounds: No murmur heard. Pulmonary:     Effort: Pulmonary effort is normal. No respiratory distress.     Breath sounds: No rales.  Chest:     Chest wall: No tenderness.  Abdominal:     General: There is no distension.     Palpations: Abdomen is soft.     Tenderness: There is no abdominal tenderness.  Musculoskeletal:        General: Normal range of motion.     Cervical back: Normal range of motion and neck supple.  Skin:    General:  Skin is warm and dry.     Findings: No erythema.  Neurological:     Mental Status: He is alert and oriented to person, place, and time.     Cranial Nerves: No cranial nerve deficit.     Motor: No abnormal muscle tone.     Coordination: Coordination normal.  Psychiatric:        Mood and Affect: Affect normal.       CMP Latest Ref Rng & Units 07/17/2020  Glucose 70 - 99 mg/dL 108(H)  BUN 6 - 20 mg/dL 41(H)  Creatinine  0.61 - 1.24 mg/dL 1.97(H)  Sodium 135 - 145 mmol/L 136  Potassium 3.5 - 5.1 mmol/L 4.6  Chloride 98 - 111 mmol/L 107  CO2 22 - 32 mmol/L 20(L)  Calcium 8.9 - 10.3 mg/dL 9.5  Total Protein 6.5 - 8.1 g/dL 7.4  Total Bilirubin 0.3 - 1.2 mg/dL 0.9  Alkaline Phos 38 - 126 U/L 97  AST 15 - 41 U/L 25  ALT 0 - 44 U/L 17   CBC Latest Ref Rng & Units 07/17/2020  WBC 4.0 - 10.5 K/uL 12.9(H)  Hemoglobin 13.0 - 17.0 g/dL 15.4  Hematocrit 39.0 - 52.0 % 45.8  Platelets 150 - 400 K/uL 177    RADIOGRAPHIC STUDIES: I have personally reviewed the radiological images as listed and agreed with the findings in the report. CT CHEST ABDOMEN PELVIS WO CONTRAST  Result Date: 04/30/2020 CLINICAL DATA:  History of renal cell carcinoma status post left nephrectomy April 2021. Ongoing therapy. EXAM: CT CHEST, ABDOMEN AND PELVIS WITHOUT CONTRAST TECHNIQUE: Multidetector CT imaging of the chest, abdomen and pelvis was performed following the standard protocol without IV contrast. COMPARISON:  Multiple priors hemispheric most recent CT chest abdomen pelvis January 12, 2020. FINDINGS: CT CHEST FINDINGS Cardiovascular: No thoracic aortic aneurysm. No aortic atherosclerosis. Three-vessel coronary artery calcifications. Normal size heart. No significant pericardial effusion/thickening. Mediastinum/Nodes: No pathologically enlarged mediastinal hilar lymph nodes. Similar size of the paramediastinal soft tissue nodularity for instance a small soft tissue nodule along the right anterior mediastinal margin  measures 4 mm on image 22/2, unchanged. No new or enlarging lesions. Thyroid glands grossly unremarkable. Trachea esophagus are grossly unremarkable. Lungs/Pleura: Unchanged size of the 8 mm pulmonary nodule along the minor fissure in the right chest on image 75/4. No new suspicious pulmonary nodules or masses. No focal consolidations. No pleural effusion. No pneumothorax. Musculoskeletal: Multilevel degenerative changes spine. No aggressive lytic or blastic lesion of bone. CT ABDOMEN PELVIS FINDINGS Hepatobiliary: Unremarkable noncontrast appearance of the hepatic parenchyma. Gallbladder is grossly unremarkable. No biliary ductal dilation. Pancreas: Within normal limits. Spleen: Within normal limits. Adrenals/Urinary Tract: Bilateral adrenal glands are unremarkable. Status post left nephrectomy without new suspicious soft tissue nodularity in the nephrectomy bed. No right-sided hydronephrosis or contour deforming renal mass. Urinary bladder is grossly unremarkable for degree of distension. Stomach/Bowel: Stomach is grossly unremarkable for degree of distension. Normal positioning of the duodenum/ligament of [place]. No suspicious small bowel dilation. Unremarkable appearance of the appendix and terminal ileum. Radio opaque contrast traverses the rectum. Left-sided colonic diverticulosis without findings of acute diverticulitis. Vascular/Lymphatic: No significant vascular findings are present. No gastrohepatic or hepatoduodenal ligament lymphadenopathy. No retroperitoneal or mesenteric lymphadenopathy. No pelvic sidewall lymphadenopathy. No groin lymphadenopathy. Reproductive: Mildly enlarged prostate. Other: No abdominopelvic ascites. No discrete omental or mesenteric nodularity. Musculoskeletal: Multilevel degenerative changes spine. Avascular necrosis of bilateral femoral heads, similar prior. No aggressive lytic or blastic lesion of bone. Previously described subtle asymmetry of the right psoas musculature is not  evident on today's examination. IMPRESSION: 1. Stable exam. Status post left nephrectomy without evidence recurrence. No evidence of new or enlarging metastatic disease in the chest, abdomen or pelvis. 2. Previously described subtle asymmetry of the right psoas musculature is not evident on today's examination. 3. Left-sided colonic diverticulosis without findings of acute diverticulitis. 4. Avascular necrosis of bilateral femoral heads, similar over multiple priors. Electronically Signed   By: Dahlia Bailiff MD   On: 04/30/2020 12:19     Assessment and plan-  Patient is a 60 y.o. male  history of CAD, status post stent, CHF, history of ANCA positive sclerosing/crescentic GN, chronic kidney disease stage III presents for treatments of stage IV renal cell carcinoma 1. Renal cell carcinoma of left kidney (HCC)   2. Encounter for antineoplastic immunotherapy   3. Primary hypertension   4. History of CHF (congestive heart failure)   5. Stage 3b chronic kidney disease ([place])   6. Encounter for antineoplastic chemotherapy   Cancer Staging Renal cell carcinoma of left kidney ([place]) Staging form: Kidney, AJCC 8th Edition - Pathologic: Stage IV (pT3a, pNX, cM1) - Signed by Earlie Server, MD on 05/23/2019   # Stage IV renal cell carcinoma Labs are reviewed and discussed with patient. Proceed with Keytruda today Continue axitinib 5 mg twice daily.  #Intermittent diarrhea probably secondary to Axitinib /Keytruda. #Hypertension, CHF,  Continue metoprolol, lisinopril.  Lasix Stable.  #Intermittent orthopnea, chronic issue for him.  Symptoms has resolved.  Continue Lasix Pre-existing diastolic CHF, 5/61/5379 LVEF 60 to 65%.   Recommend him to follow-up with cardiology-he is has not able to follow-up with cardiology due to lack of insurance coverage.  #Brain lesion  neurocysticercosis finished therapy.  Follow-up with ID  #CKD, creatinine stable with stable creatinine.  Encourage oral hydration.  Avoid  nephrotoxin.    Spanish interpreter presented for the entire encounter for translation. We spent sufficient time to discuss many aspect of care, questions were answered to patient's satisfaction. Follow-up in 3 weeks for follow-up of evaluation prior to immunotherapy.  Earlie Server, MD, PhD Hematology Oncology [facility] Pager- [phone] 07/17/2020

## 2020-07-17 NOTE — Patient Instructions (Signed)
[facility] ONCOLOGY  Discharge Instructions: Thank you for choosing [facility] to provide your oncology and hematology care.  If you have a lab appointment with the [facility], please go directly to the [facility] and check in at the registration area.  Wear comfortable clothing and clothing appropriate for easy access to any Portacath or PICC line.   We strive to give you quality time with your provider. You may need to reschedule your appointment if you arrive late (15 or more minutes).  Arriving late affects you and other patients whose appointments are after yours.  Also, if you miss three or more appointments without notifying the office, you may be dismissed from the clinic at the provider's discretion.      For prescription refill requests, have your pharmacy contact our office and allow 72 hours for refills to be completed.    Today you received the following chemotherapy and/or immunotherapy agents - pembrolizumab      To help prevent nausea and vomiting after your treatment, we encourage you to take your nausea medication as directed.  BELOW ARE SYMPTOMS THAT SHOULD BE REPORTED IMMEDIATELY: *FEVER GREATER THAN 100.4 F (38 C) OR HIGHER *CHILLS OR SWEATING *NAUSEA AND VOMITING THAT IS NOT CONTROLLED WITH YOUR NAUSEA MEDICATION *UNUSUAL SHORTNESS OF BREATH *UNUSUAL BRUISING OR BLEEDING *URINARY PROBLEMS (pain or burning when urinating, or frequent urination) *BOWEL PROBLEMS (unusual diarrhea, constipation, pain near the anus) TENDERNESS IN MOUTH AND THROAT WITH OR WITHOUT PRESENCE OF ULCERS (sore throat, sores in mouth, or a toothache) UNUSUAL RASH, SWELLING OR PAIN  UNUSUAL VAGINAL DISCHARGE OR ITCHING   Items with * indicate a potential emergency and should be followed up as soon as possible or go to the Emergency Department if any problems should occur.  Please show the CHEMOTHERAPY ALERT CARD or IMMUNOTHERAPY ALERT CARD at  check-in to the Emergency Department and triage nurse.  Should you have questions after your visit or need to cancel or reschedule your appointment, please contact [facility]  [phone] and follow the prompts.  Office hours are 8:00 a.m. to 4:30 p.m. Monday - Friday. Please note that voicemails left after 4:00 p.m. may not be returned until the following business day.  We are closed weekends and major holidays. You have access to a nurse at all times for urgent questions. Please call the main number to the clinic [phone] and follow the prompts.  For any non-urgent questions, you may also contact your provider using MyChart. We now offer e-Visits for anyone 33 and older to request care online for non-urgent symptoms. For details visit [email or web address].   Also download the MyChart app! Go to the app store, search "MyChart", open the app, select [facility], and log in with your MyChart username and password.  Due to Covid, a mask is required upon entering the hospital/clinic. If you do not have a mask, one will be given to you upon arrival. For doctor visits, patients may have 1 support person aged 65 or older with them. For treatment visits, patients cannot have anyone with them due to current Covid guidelines and our immunocompromised population.

## 2020-07-17 NOTE — Progress Notes (Signed)
Patient here for oncology follow-up appointment, expresses concerns of diarrhea. Interpreter used to assist with assessment.

## 2020-07-31 ENCOUNTER — Other Ambulatory Visit: Payer: Self-pay

## 2020-07-31 ENCOUNTER — Ambulatory Visit
Admission: RE | Admit: 2020-07-31 | Discharge: 2020-07-31 | Disposition: A | Discharge status: Home / Self Care | Payer: Self-pay | Source: Ambulatory Visit | Attending: Oncology | Admitting: Oncology

## 2020-07-31 DIAGNOSIS — C642 Malignant neoplasm of left kidney, except renal pelvis: Secondary | ICD-10-CM | POA: Insufficient documentation

## 2020-08-07 ENCOUNTER — Inpatient Hospital Stay (HOSPITAL_BASED_OUTPATIENT_CLINIC_OR_DEPARTMENT_OTHER): Payer: Self-pay | Admitting: Oncology

## 2020-08-07 ENCOUNTER — Telehealth: Payer: Self-pay

## 2020-08-07 ENCOUNTER — Other Ambulatory Visit: Payer: Self-pay | Admitting: Infectious Diseases

## 2020-08-07 ENCOUNTER — Encounter: Payer: Self-pay | Admitting: Oncology

## 2020-08-07 ENCOUNTER — Inpatient Hospital Stay: Payer: Self-pay

## 2020-08-07 VITALS — BP 112/80 | HR 74 | Temp 97.2°F | Resp 18 | Wt 151.7 lb

## 2020-08-07 DIAGNOSIS — Z5112 Encounter for antineoplastic immunotherapy: Secondary | ICD-10-CM

## 2020-08-07 DIAGNOSIS — B690 Cysticercosis of central nervous system: Secondary | ICD-10-CM

## 2020-08-07 DIAGNOSIS — Z8679 Personal history of other diseases of the circulatory system: Secondary | ICD-10-CM

## 2020-08-07 DIAGNOSIS — Z5111 Encounter for antineoplastic chemotherapy: Secondary | ICD-10-CM

## 2020-08-07 DIAGNOSIS — C642 Malignant neoplasm of left kidney, except renal pelvis: Secondary | ICD-10-CM

## 2020-08-07 DIAGNOSIS — R197 Diarrhea, unspecified: Secondary | ICD-10-CM

## 2020-08-07 DIAGNOSIS — I10 Essential (primary) hypertension: Secondary | ICD-10-CM

## 2020-08-07 DIAGNOSIS — N1832 Chronic kidney disease, stage 3b: Secondary | ICD-10-CM

## 2020-08-07 LAB — COMPREHENSIVE METABOLIC PANEL
ALT: 13 U/L (ref 0–44)
AST: 19 U/L (ref 15–41)
Albumin: 3.8 g/dL (ref 3.5–5.0)
Alkaline Phosphatase: 96 U/L (ref 38–126)
Anion gap: 8 (ref 5–15)
BUN: 39 mg/dL — ABNORMAL HIGH (ref 6–20)
CO2: 22 mmol/L (ref 22–32)
Calcium: 9 mg/dL (ref 8.9–10.3)
Chloride: 102 mmol/L (ref 98–111)
Creatinine, Ser: 1.76 mg/dL — ABNORMAL HIGH (ref 0.61–1.24)
GFR, Estimated: 44 mL/min — ABNORMAL LOW (ref >60)
Glucose, Bld: 107 mg/dL — ABNORMAL HIGH (ref 70–99)
Potassium: 5.1 mmol/L (ref 3.5–5.1)
Sodium: 132 mmol/L — ABNORMAL LOW (ref 135–145)
Total Bilirubin: 0.5 mg/dL (ref 0.3–1.2)
Total Protein: 6.5 g/dL (ref 6.5–8.1)

## 2020-08-07 LAB — GASTROINTESTINAL PANEL BY PCR, STOOL (REPLACES STOOL CULTURE)

## 2020-08-07 LAB — CBC WITH DIFFERENTIAL/PLATELET
Abs Immature Granulocytes: 0.02 10*3/uL (ref 0.00–0.07)
Basophils Absolute: 0 10*3/uL (ref 0.0–0.1)
Basophils Relative: 1 %
Eosinophils Absolute: 0.5 10*3/uL (ref 0.0–0.5)
Eosinophils Relative: 6 %
HCT: 40.8 % (ref 39.0–52.0)
Hemoglobin: 13.4 g/dL (ref 13.0–17.0)
Immature Granulocytes: 0 %
Lymphocytes Relative: 15 %
Lymphs Abs: 1.3 10*3/uL (ref 0.7–4.0)
MCH: 28 pg (ref 26.0–34.0)
MCHC: 32.8 g/dL (ref 30.0–36.0)
MCV: 85.2 fL (ref 80.0–100.0)
Monocytes Absolute: 1.2 10*3/uL — ABNORMAL HIGH (ref 0.1–1.0)
Monocytes Relative: 13 %
Neutro Abs: 5.8 10*3/uL (ref 1.7–7.7)
Neutrophils Relative %: 65 %
Platelets: 177 10*3/uL (ref 150–400)
RBC: 4.79 MIL/uL (ref 4.22–5.81)
RDW: 13.7 % (ref 11.5–15.5)
WBC: 8.8 10*3/uL (ref 4.0–10.5)
nRBC: 0 % (ref 0.0–0.2)

## 2020-08-07 LAB — C DIFFICILE QUICK SCREEN W PCR REFLEX
C Diff antigen: NEGATIVE
C Diff interpretation: NOT DETECTED
C Diff toxin: NEGATIVE

## 2020-08-07 MED ORDER — LOPERAMIDE HCL 2 MG PO CAPS: 2.0000 mg | ORAL_CAPSULE | ORAL | 0 refills | Status: DC

## 2020-08-07 NOTE — Progress Notes (Signed)
Hematology/Oncology Progress Note [facility] Telephone:([phone] [phone]  Patient Care Team: Center, [facility] as PCP - General (General Practice)   Name of the patient: Mark Donaldson  [id number]  Jun 08, 1960  Date of visit: 08/07/20   INTERVAL HISTORY-  60 y.o. male presents for follow-up of renal cell carcinoma treatments. Patient has past medical history CAD status post PCI to LAD in 2018, on aspirin and Plavix, CHF, history of focal sclerosing/crescentic GN-ANCA positive and CKD stage III. He was hospitalized from 05/02/2019-05/07/2019 due to gross hematuria. CT showed a 7.4 cm left lower pole renal mass concerning for RCC.  Patient also has necrotic lymphadenopathy in the mediastinum and the right supra hilar region which were highly suspicious for metastatic disease.  Bilateral lung nodules, also concerning for metastatic lung disease.  Right retrocrural lymphadenopathy with upper normal left periaortic lymph node. Patient was recommended for cytoreductive radical nephrectomy.  With history of ANCA positive vasculitis, also recommend thoracic lymphadenopathy biopsy to confirm distal metastasis.  Patient agreed to radical nephrectomy and declined bronchoscopy biopsy. He underwent left radical nephrectomy on 05/05/2019. Pathology showed pT3a pNx, RCC, conventional clear cell type, grade 3 Patient was discharged home. Today he presents to discuss pathology and the management plan. He was accompanied by his wife and daughter. Patient reports that he uses hydrocodone for pain.  Pain around the surgical site has improved.  He occasionally has tingling sensation around the surgical site.  Denies any fever or chills, drainage from the surgical site.  # Interval CT chest abdomen pelvis was independently reviewed by me and discussed with patient. 01/12/2020, CT showed overall marked improvement.  Comparing to CT scan in August 2021,  continues to have treatment response.Mediastinal soft tissue nodule nearly completely resolved, right minor fissure nodule 8 mm, decreased in size.  Resolution of pleural fluid and thickening of the right chest.  Small pleural effusion in the left chest.  No new lung nodules.-Subtle variation of the muscle architecture of the left psoas muscle of unknown significance.-Continue observation.  He is asymptomatic. Subtle nodule in the soft tissue of the left flank.  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 He finished 14 days of albendazole plus praziquantel .   He has had few side effects including poor appetite, hair loss, weight loss, bloody stool, abdominal cramping or diarrhea. All symptoms have improved since he completed the treatment. 03/16/2020 he finished tapering course of steroids  # It has been noted that his glucose level has been elevated in December 2021, even before he was started on prednisone treatments. Dr. Steva Ready has obtained A1c which came back elevated at 8.7.  # 04/30/2020, CT chest abdomen pelvis without contrast showed stable disease.   #04/30/2020, axitinib and immunotherapy were held due to diarrhea and fatigue. #05/15/2020 resumed on immunotherapy.   INTERVAL HISTORY Mark Donaldson is a 60 y.o. male who has above history reviewed by me today presents for follow up visit for management of metastatic RCC Problems and complaints are listed below: Patient is Spanish-speaking.  Online Spanish interpreter present for the entire encounter for translation He reports feeling nausea when he smells food, diarrhea about 6-7 times per day. He usually take imodium twice daily with some symptoms relief.     Review of systems- Review of Systems  Constitutional:  Negative for appetite change, chills, fatigue, fever and unexpected weight change.  HENT:   Negative for voice change.   Eyes:  Negative for eye problems and  icterus.  Respiratory:  Negative for  chest tightness and cough.   Cardiovascular:  Negative for chest pain and leg swelling.  Gastrointestinal:  Negative for abdominal distention, abdominal pain and diarrhea.  Endocrine: Negative for hot flashes.  Genitourinary:  Negative for difficulty urinating, dysuria and frequency.   Musculoskeletal:  Negative for arthralgias.  Skin:  Negative for itching and rash.  Neurological:  Negative for light-headedness and numbness.  Hematological:  Negative for adenopathy. Does not bruise/bleed easily.  Psychiatric/Behavioral:  Negative for confusion.    No Known Allergies  Patient Active Problem List   Diagnosis Date Noted   History of CHF (congestive heart failure) 05/01/2020   Hyperglycemia 02/28/2020   Stage 3b chronic kidney disease ([place]) 10/04/2019   Brain lesion 10/04/2019   Encounter for antineoplastic immunotherapy 07/19/2019   Encounter for antineoplastic chemotherapy 07/19/2019   CNS lesion 07/07/2019   Anemia in stage 3b chronic kidney disease ([place]) 06/07/2019   Goals of care, counseling/discussion 05/23/2019   Renal cell carcinoma of left kidney ([place]) 05/23/2019   Renal cell carcinoma ([place]) 05/23/2019   Palliative care encounter    History of vasculitis    Thoracic lymphadenopathy    Lung nodule    CAD S/P percutaneous coronary angioplasty 05/02/2019   Kidney mass 05/02/2019   Hydronephrosis of left kidney 05/02/2019   UTI (urinary tract infection) 05/02/2019   AKI (acute kidney injury) ([place]) 05/02/2019   Gross hematuria 05/02/2019   Chronic diastolic CHF (congestive heart failure) ([place]) 05/02/2019   Sepsis ([place]) 05/02/2019   Severe sepsis ([place]) 05/02/2019   Preop cardiovascular exam    Hyperlipidemia 05/21/2016   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 ([place]) 05/21/2016   Prediabetes 05/21/2016   Alcohol abuse 05/18/2016   Essential hypertension 05/18/2016   Vasculitis, 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[place]) 05/18/2016   Shortness of breath 05/17/2016     Past Medical  History:  Diagnosis Date   ANCA-positive vasculitis (HCC)    CAD (coronary artery disease)    Essential hypertension    HFrEF (heart failure with reduced ejection fraction) (HCC)    Hyperlipidemia LDL goal <70    Ischemic cardiomyopathy    Renal cell carcinoma ([place]) 05/23/2019   Renal disorder      Past Surgical History:  Procedure Laterality Date   CARDIAC SURGERY     cardiac cath with 2 stent placement   LAPAROSCOPIC NEPHRECTOMY, HAND ASSISTED Left 05/05/2019   Procedure: HAND ASSISTED LAPAROSCOPIC NEPHRECTOMY;  Surgeon: Hollice Espy, MD;  Location: [facility] ORS;  Service: Urology;  Laterality: Left;    Social History   Socioeconomic History   Marital status: Married    Spouse name: Not on file   Number of children: Not on file   Years of education: Not on file   Highest education level: Not on file  Occupational History   Not on file  Tobacco Use   Smoking status: Never   Smokeless tobacco: Never  Substance and Sexual Activity   Alcohol use: Yes   Drug use: Not on file   Sexual activity: Not on file  Other Topics Concern   Not on file  Social History Narrative   Not on file   Social Determinants of Health   Financial Resource Strain: Not on file  Food Insecurity: Not on file  Transportation Needs: Not on file  Physical Activity: Not on file  Stress: Not on file  Social Connections: Not on file  Intimate Partner Violence: Not on file     Family History  Problem Relation Age of Onset   Cancer Mother    Blindness Brother      Current Outpatient Medications:    aspirin 81 MG EC tablet, Take 81 mg by mouth daily. Swallow whole., Disp: , Rfl:    atorvastatin (LIPITOR) 80 MG tablet, Take 80 mg by mouth daily., Disp: , Rfl:    axitinib (INLYTA) 5 MG tablet, Take 1 tablet (5 mg total) by mouth 2 (two) times daily., Disp: 60 tablet, Rfl: 3   clopidogrel (PLAVIX) 75 MG tablet, Take 75 mg by mouth daily., Disp: , Rfl:    furosemide (LASIX) 20 MG tablet, Take 1  tablet (20 mg total) by mouth daily., Disp: 30 tablet, Rfl: 3   KEYTRUDA 100 MG/4ML SOLN, INJECT 200MG  INTRAVENOUSLY EVERY 3 WEEKS, Disp: 8 mL, Rfl: 11   lisinopril (ZESTRIL) 10 MG tablet, Take 10 mg by mouth daily., Disp: , Rfl:    loperamide (IMODIUM) 2 MG capsule, Take 1 capsule (2 mg total) by mouth See admin instructions. Take 2 tablets with onset of diarrhea, then 1 tablet for every 2 hours until diarrhea stops. Maximum 8 tablets per 24 hours., Disp: 60 capsule, Rfl: 0   metoprolol succinate (TOPROL-XL) 100 MG 24 hr tablet, Take 1.5 tablets by mouth daily., Disp: , Rfl:    Physical exam:  Vitals:   08/07/20 0841  BP: 112/80  Pulse: 74  Resp: 18  Temp: (!) 97.2 F (36.2 C)  Weight: 151 lb 11.2 oz (68.8 kg)   Physical Exam Constitutional:      General: He is not in acute distress.    Appearance: He is not diaphoretic.  HENT:     Head: Normocephalic and atraumatic.     Nose: Nose normal.     Mouth/Throat:     Pharynx: No oropharyngeal exudate.  Eyes:     General: No scleral icterus.    Pupils: Pupils are equal, round, and reactive to light.  Cardiovascular:     Rate and Rhythm: Normal rate and regular rhythm.     Heart sounds: No murmur heard. Pulmonary:     Effort: Pulmonary effort is normal. No respiratory distress.     Breath sounds: No rales.  Chest:     Chest wall: No tenderness.  Abdominal:     General: There is no distension.     Palpations: Abdomen is soft.     Tenderness: There is no abdominal tenderness.  Musculoskeletal:        General: Normal range of motion.     Cervical back: Normal range of motion and neck supple.  Skin:    General: Skin is warm and dry.     Findings: No erythema.  Neurological:     Mental Status: He is alert and oriented to person, place, and time.     Cranial Nerves: No cranial nerve deficit.     Motor: No abnormal muscle tone.     Coordination: Coordination normal.  Psychiatric:        Mood and Affect: Affect normal.        CMP Latest Ref Rng & Units 08/07/2020  Glucose 70 - 99 mg/dL 107(H)  BUN 6 - 20 mg/dL 39(H)  Creatinine 0.61 - 1.24 mg/dL 1.76(H)  Sodium 135 - 145 mmol/L 132(L)  Potassium 3.5 - 5.1 mmol/L 5.1  Chloride 98 - 111 mmol/L 102  CO2 22 - 32 mmol/L 22  Calcium 8.9 - 10.3 mg/dL 9.0  Total Protein 6.5 - 8.1 g/dL 6.5  Total Bilirubin  0.3 - 1.2 mg/dL 0.5  Alkaline Phos 38 - 126 U/L 96  AST 15 - 41 U/L 19  ALT 0 - 44 U/L 13   CBC Latest Ref Rng & Units 08/07/2020  WBC 4.0 - 10.5 K/uL 8.8  Hemoglobin 13.0 - 17.0 g/dL 13.4  Hematocrit 39.0 - 52.0 % 40.8  Platelets 150 - 400 K/uL 177    RADIOGRAPHIC STUDIES: I have personally reviewed the radiological images as listed and agreed with the findings in the report. CT CHEST ABDOMEN PELVIS WO CONTRAST  Result Date: 08/01/2020 CLINICAL DATA:  History of renal cell carcinoma status post left nephrectomy April 2021. Ongoing therapy. EXAM: CT CHEST, ABDOMEN AND PELVIS WITHOUT CONTRAST TECHNIQUE: Multidetector CT imaging of the chest, abdomen and pelvis was performed following the standard protocol without IV contrast. COMPARISON:  Multiple priors including most recent CT April 18,2022. FINDINGS: CT CHEST FINDINGS Cardiovascular: No thoracic aortic aneurysm. No aortic atherosclerosis. Three-vessel coronary artery calcifications. Normal size heart. No significant pericardial effusion/thickening. Mediastinum/Nodes: No pathologically enlarged mediastinal hilar lymph nodes. No significant change in the paramediastinal soft tissue nodularity for instance a small soft tissue nodule along the right anterior mediastinal margin measures 4 mm on image 24/2, unchanged. No new or enlarging lesions. No discrete thyroid gland nodule. Trachea and esophagus are grossly unremarkable. Lungs/Pleura: Decreased size of the pulmonary nodule along the minor fissure in the right chest now measuring 6 mm on image 75/4, previously 8 mm. No new suspicious pulmonary nodules or masses.  Mild centrilobular emphysema. Diffuse bronchial thickening with diffuse ground-glass micro nodularity for instance in the anterior upper lobes on image 60/3 and and posterior lower lobes 97/3, favored infectious or inflammatory. No pleural effusion. No pneumothorax. Musculoskeletal: Multilevel degenerative changes of the spine. No aggressive lytic or blastic lesion of bone. CT ABDOMEN PELVIS FINDINGS Hepatobiliary: Unremarkable noncontrast appearance the hepatic parenchyma. Gallbladder is. No ductal dilation. Pancreas:  Within normal limits. Spleen:  Within normal limits. Adrenals/Urinary Tract: Bilateral adrenal glands are unremarkable. Status post left nephrectomy without new suspicious soft tissue nodularity in the nephrectomy bed. No right-sided hydronephrosis or contour deforming renal mass. Urinary bladder is grossly unremarkable for degree of distension. Stomach/Bowel: Stomach is grossly unremarkable for degree of distension. Normal positioning of the duodenum/ligament of [place]. No suspicious small bowel dilation. Appendix and terminal ileum appear normal. Radio opaque enteric contrast traverses the rectum. Left-sided colonic diverticulosis without findings of acute diverticulitis. Vascular/Lymphatic: No significant vascular findings are present. No pathologically enlarged abdominal or pelvic lymph nodes. Reproductive: Mildly enlarged prostate. Other: No abdominopelvic ascites.  No discrete peritoneal nodules. Musculoskeletal: Multilevel degenerative changes spine. 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 No aggressive lytic or blastic lesion of bone. IMPRESSION: 1. Overall stable examination status post LEFT nephrectomy without evidence of local recurrence. No evidence of new or enlarging metastatic disease in the chest, abdomen or pelvis. 2. Slightly decreased size of the now 6 mm pulmonary nodule along the minor fissure in the right chest with stable tiny soft tissue nodularity in the  anterior mediastinum corresponding with prior necrotic lymph nodes. 3. Diffuse bronchial thickening and ground-glass micro nodularity, favored infectious or inflammatory. However, while this is not a typical pattern for drug related lung injury, in a patient on immunotherapy it is still a differential consideration. 4. Left-sided colonic diverticulosis without findings of acute diverticulitis. 5. 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 6. Aortic Atherosclerosis (ICD10-I70.0) and Emphysema (ICD10-J43.9). Electronically Signed   By: Dahlia Bailiff MD  On: 08/01/2020 08:33     Assessment and plan-  Patient is a 60 y.o. male history of CAD, status post stent, CHF, history of ANCA positive sclerosing/crescentic GN, chronic kidney disease stage III presents for treatments of stage IV renal cell carcinoma 1. Renal cell carcinoma of left kidney (HCC)   2. Encounter for antineoplastic immunotherapy   3. History of CHF (congestive heart failure)   4. Stage 3b chronic kidney disease ([place])   5. Encounter for antineoplastic chemotherapy   6. Primary hypertension   7. Diarrhea, unspecified type   Cancer Staging Renal cell carcinoma of left kidney ([place]) Staging form: Kidney, AJCC 8th Edition - Pathologic: Stage IV (pT3a, pNX, cM1) - Signed by Earlie Server, MD on 05/23/2019   # Stage IV renal cell carcinoma Labs are reviewed and discussed with patient. Hold Axitinib and Keytruda today due to diarrhea.  07/31/2020, CT chest abdomen pelvis without contrast images were independent reviewed by me and discussed with patient. Stable disease with no evidence of new or enlarging metastatic disease in the chest abdomen pelvis.  Slightly decreased size of the 6 mm pulmonary nodule in the right chest.  Diffuse bronchial thickening/groundglass micro nodularity.-Infectious/inflammatory versus immunotherapy treatment side effects.  Left-sided chronic diverticulosis with no typhlitis.   Bilateral femoral heads avascular necrosis.  Emphysema and aortic atherosclerosis  Patient denies any hip pain, shortness of breath.  # Diarrhea, probably due to immunotherapy and/or Axitinib.  Check C diff and GI profile.  Discussed about imodium instructions.   #Hypertension, CHF,  Continue metoprolol, lisinopril.  Lasix Stable.  #Intermittent orthopnea, chronic issue for him.  Symptoms has resolved.  Continue Lasix Pre-existing diastolic CHF, 3/81/8299 LVEF 60 to 65%.   Recommend him to follow-up with cardiology-he is has not able to follow-up with cardiology due to lack of insurance coverage.  #Brain lesion-  neurocysticercosis finished therapy.  Follow-up with ID  #CKD, creatinine stable with stable creatinine.  Encourage oral hydration.  Avoid nephrotoxin.    Online Spanish interpreter presented for the entire encounter for translation. We spent sufficient time to discuss many aspect of care, questions were answered to patient's satisfaction. Follow-up in 2 weeks for follow-up of evaluation prior to immunotherapy.  Earlie Server, MD, PhD Hematology Oncology [facility] Pager- [phone] 08/07/2020

## 2020-08-07 NOTE — Progress Notes (Signed)
Patient here for follow up. Pt reports that he has had decreased appetite, feels nauseated when he smells food. He continues to have diarrhea. Pt also reports having nightmares and he is unsure if it can be related to medication.

## 2020-08-07 NOTE — Progress Notes (Signed)
Pt needs MRI  Brain , 6 months post neurocysticercosis treatment

## 2020-08-07 NOTE — Telephone Encounter (Signed)
[employer] [phone] left a spanish voicemail stating he should not come Thursday to [facility] ID and that we need him to do the MRI first. Also advised on VM that he will get a call from radiology scheduling this imaging.

## 2020-08-09 ENCOUNTER — Telehealth: Payer: Self-pay

## 2020-08-09 ENCOUNTER — Ambulatory Visit: Payer: Self-pay | Admitting: Infectious Diseases

## 2020-08-09 NOTE — Telephone Encounter (Signed)
Scheduled MRI for 08/13/2020 and advised them he will need interp. Patient has been given date and time of MRI and instructed on where to go as daughter was with him and speaks english. He will see [facility] ID 08/16/2020 12:00 interp ordered for that date also.

## 2020-08-13 ENCOUNTER — Ambulatory Visit
Admission: RE | Admit: 2020-08-13 | Discharge: 2020-08-13 | Disposition: A | Discharge status: Home / Self Care | Payer: Self-pay | Source: Ambulatory Visit | Attending: Infectious Diseases | Admitting: Infectious Diseases

## 2020-08-13 ENCOUNTER — Other Ambulatory Visit: Payer: Self-pay

## 2020-08-13 DIAGNOSIS — B690 Cysticercosis of central nervous system: Secondary | ICD-10-CM | POA: Insufficient documentation

## 2020-08-13 MED ORDER — GADOBUTROL 1 MMOL/ML IV SOLN
6.0000 mL | Freq: Once | INTRAVENOUS | Status: AC | PRN
  Administered 2020-08-13: 6 mL via INTRAVENOUS

## 2020-08-16 ENCOUNTER — Ambulatory Visit: Payer: Self-pay | Attending: Infectious Diseases | Admitting: Infectious Diseases

## 2020-08-16 ENCOUNTER — Other Ambulatory Visit: Payer: Self-pay

## 2020-08-16 VITALS — BP 109/66 | HR 76 | Resp 16 | Ht 63.0 in | Wt 154.0 lb

## 2020-08-16 DIAGNOSIS — Z955 Presence of coronary angioplasty implant and graft: Secondary | ICD-10-CM | POA: Insufficient documentation

## 2020-08-16 DIAGNOSIS — C649 Malignant neoplasm of unspecified kidney, except renal pelvis: Secondary | ICD-10-CM | POA: Insufficient documentation

## 2020-08-16 DIAGNOSIS — B690 Cysticercosis of central nervous system: Secondary | ICD-10-CM | POA: Insufficient documentation

## 2020-08-16 DIAGNOSIS — Z79899 Other long term (current) drug therapy: Secondary | ICD-10-CM | POA: Insufficient documentation

## 2020-08-16 DIAGNOSIS — I130 Hypertensive heart and chronic kidney disease with heart failure and stage 1 through stage 4 chronic kidney disease, or unspecified chronic kidney disease: Secondary | ICD-10-CM | POA: Insufficient documentation

## 2020-08-16 DIAGNOSIS — N183 Chronic kidney disease, stage 3 unspecified: Secondary | ICD-10-CM | POA: Insufficient documentation

## 2020-08-16 DIAGNOSIS — Z905 Acquired absence of kidney: Secondary | ICD-10-CM | POA: Insufficient documentation

## 2020-08-16 DIAGNOSIS — I5022 Chronic systolic (congestive) heart failure: Secondary | ICD-10-CM | POA: Insufficient documentation

## 2020-08-16 DIAGNOSIS — R197 Diarrhea, unspecified: Secondary | ICD-10-CM | POA: Insufficient documentation

## 2020-08-16 DIAGNOSIS — Z7902 Long term (current) use of antithrombotics/antiplatelets: Secondary | ICD-10-CM | POA: Insufficient documentation

## 2020-08-16 DIAGNOSIS — Z7982 Long term (current) use of aspirin: Secondary | ICD-10-CM | POA: Insufficient documentation

## 2020-08-16 NOTE — Progress Notes (Signed)
NAME: Mark Donaldson  DOB: Feb 08, 1960  MRN: [id number]  Date/Time: 08/16/2020 12:27 PM  Subjective:  Patient here  follow-up visit. 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 Says his hair has grown ( he lost the beard hair while on treatment for nerocysticercosis He also had severe diarrhea which resolved after his renal cancer meds axitinib and pembrolizumab was put on hold for the past 3 weeks He has moe energy and his appetite is much improved now. He has a follow up with oncology in a week ?  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[place])    CAD (coronary artery disease)    Essential hypertension    HFrEF (heart failure with reduced ejection fraction) (HCC)    Hyperlipidemia LDL goal <70    Ischemic cardiomyopathy    Renal cell carcinoma ([place]) 05/23/2019   Renal disorder     Past Surgical History:  Procedure Laterality Date   CARDIAC SURGERY     cardiac cath with 2 stent placement   LAPAROSCOPIC NEPHRECTOMY, HAND ASSISTED Left 05/05/2019   Procedure: HAND ASSISTED LAPAROSCOPIC NEPHRECTOMY;  Surgeon: Hollice Espy, MD;  Location: [facility] ORS;  Service: Urology;  Laterality: Left;    Social History   Socioeconomic History   Marital status: Married    Spouse name: Not on file   Number of children: Not on file   Years of education: Not on file   Highest education level: Not on file  Occupational History    Not on file  Tobacco Use   Smoking status: Never   Smokeless tobacco: Never  Substance and Sexual Activity   Alcohol use: Yes   Drug use: Not on file   Sexual activity: Not on file  Other Topics Concern   Not on file  Social History Narrative   Not on file   Social Determinants of Health   Financial Resource Strain: Not on file  Food Insecurity: Not on file  Transportation Needs: Not on file  Physical Activity: Not on file  Stress: Not on file  Social Connections: Not on file  Intimate Partner Violence: Not on file    Family History  Problem Relation Age of Onset   Cancer Mother    Blindness Brother    No Known Allergies  ? Current Outpatient Medications  Medication Sig Dispense Refill   aspirin 81 MG EC tablet Take 81 mg by mouth daily. Swallow whole.     atorvastatin (LIPITOR) 80 MG tablet Take 80 mg by mouth daily.     clopidogrel (PLAVIX) 75 MG tablet Take 75 mg by mouth daily.     furosemide (LASIX) 20 MG tablet Take 1 tablet (20 mg total) by mouth daily. 30 tablet 3   lisinopril (ZESTRIL) 10 MG tablet Take 10 mg by mouth daily.     loperamide (IMODIUM) 2 MG capsule Take 1 capsule (2 mg total) by mouth See admin instructions. Take 2 tablets with onset of diarrhea, then 1 tablet for every  2 hours until diarrhea stops. Maximum 8 tablets per 24 hours. 60 capsule 0   axitinib (INLYTA) 5 MG tablet Take 1 tablet (5 mg total) by mouth 2 (two) times daily. (Patient not taking: Reported on 08/16/2020) 60 tablet 3   KEYTRUDA 100 MG/4ML SOLN INJECT 200MG  INTRAVENOUSLY EVERY 3 WEEKS (Patient not taking: Reported on 08/16/2020) 8 mL 11   metoprolol succinate (TOPROL-XL) 100 MG 24 hr tablet Take 1.5 tablets by mouth daily.     No current facility-administered medications for this visit.     Abtx:  Anti-infectives (From admission, onward)    None       REVIEW OF SYSTEMS:  Const: negative fever, negative chills, has gained 4 pounds since last month Eyes: Chronic poor vision  right eye, negative eye pain ENT: no sorethroat Resp: no cough or SOB Cards: No chest pain, palpitations GU: negative for frequency, dysuria and hematuria GI: no nausea, vomiting, diarrhea Skin:No itching or rash Heme: negative for easy bruising and gum/nose bleeding MS: no fatigue, no body aches Neurolo:,   No headaches or dizziness Psych: negative for feelings of anxiety, depression  Endocrine: Has diabetes Allergy/Immunology- negative for any medication or food allergies ?  Objective BP 109/66   Pulse 76   Resp 16   Ht 5\' 3"  (1.6 m)   Wt 154 lb (69.9 kg)   SpO2 98%   BMI 27.28 kg/m   PHYSICAL EXAM:  General: Looks well, no distress Head: Normocephalic, without obvious abnormality, atraumatic.  Hair growing well on his scalp and beard Eyes: PERLA ENT Nares normal. No drainage or sinus tenderness. Lips, mucosa, and tongue normal.  No thrush Neck: Supple, symmetrical, no adenopathy, thyroid: non tender no carotid bruit and no JVD. Back: No CVA tenderness. Lungs: Clear to auscultation bilaterally. No Wheezing or Rhonchi. No rales. Heart: Regular rate and rhythm, no murmur, rub or gallop. Abdomen: Soft, non-tender,not distended. Bowel sounds normal. No masses Extremities: atraumatic, no cyanosis. No edema. No clubbing Skin:normal Lymph: Cervical, supraclavicular normal. Neurologic: Grossly non-focal Pertinent Labs Lab Results CBC    Component Value Date/Time   WBC 8.8 08/07/2020 0823   RBC 4.79 08/07/2020 0823   HGB 13.4 08/07/2020 0823   HCT 40.8 08/07/2020 0823   PLT 177 08/07/2020 0823   MCV 85.2 08/07/2020 0823   MCH 28.0 08/07/2020 0823   MCHC 32.8 08/07/2020 0823   RDW 13.7 08/07/2020 0823   LYMPHSABS 1.3 08/07/2020 0823   MONOABS 1.2 (H) 08/07/2020 0823   EOSABS 0.5 08/07/2020 0823   BASOSABS 0.0 08/07/2020 0823    CMP Latest Ref Rng & Units 08/07/2020 07/17/2020 06/26/2020  Glucose 70 - 99 mg/dL 107(H) 108(H) 113(H)  BUN 6 - 20 mg/dL 39(H) 41(H) 47(H)   Creatinine 0.61 - 1.24 mg/dL 1.76(H) 1.97(H) 2.29(H)  Sodium 135 - 145 mmol/L 132(L) 136 137  Potassium 3.5 - 5.1 mmol/L 5.1 4.6 4.9  Chloride 98 - 111 mmol/L 102 107 107  CO2 22 - 32 mmol/L 22 20(L) 21(L)  Calcium 8.9 - 10.3 mg/dL 9.0 9.5 9.3  Total Protein 6.5 - 8.1 g/dL 6.5 7.4 7.1  Total Bilirubin 0.3 - 1.2 mg/dL 0.5 0.9 0.5  Alkaline Phos 38 - 126 U/L 96 97 88  AST 15 - 41 U/L 19 25 23   ALT 0 - 44 U/L 13 17 30 June 2019   Aug 2022  June 2021  Aug 2022   Cystic lesions are similar to decreased in size when compared to prior MRI  except for 1 interhemispheric fissure lesion which it is new. However, they are more consistent with granular nodular stage. 3. Calcified lesions are similar to prior MRI.    ? Impression/Recommendation ?  ?Neurocysticercosis of the brain. Completed 2 weeks of albendazole and praziquantel and a 8 week steroid taper Doing well, no complaints Repeat MRI of th brain compared with old MRI and discussed with radiologist. There is improvement in the size of the parietal and occipital lesions There is an obvious interhemispheral fissure lesion which is showing up in the latest MRI Will do reseearch and reach out to  expert  regarding this  HTN on metoprolol and frusemide  Diarrhea- resolved since stopping pembriluzimab and axitinib   Renal carcinoma  ? _  CKD-much improved last cr 1.76  CAD status post stent on Plavix, atorvastatin Dicussed th findings of MRI with him in great detail.No further treatment needed now   Follow PRN

## 2020-08-16 NOTE — Patient Instructions (Signed)
You are here for 6 month follow up of neurocysticerosis All lesions look better except one. No more treatment needed for now- I will show the pictures to the experts in [place]

## 2020-08-21 ENCOUNTER — Inpatient Hospital Stay: Payer: Self-pay | Attending: Oncology

## 2020-08-21 ENCOUNTER — Inpatient Hospital Stay (HOSPITAL_BASED_OUTPATIENT_CLINIC_OR_DEPARTMENT_OTHER): Payer: Self-pay | Admitting: Oncology

## 2020-08-21 ENCOUNTER — Inpatient Hospital Stay: Payer: Self-pay

## 2020-08-21 ENCOUNTER — Encounter: Payer: Self-pay | Admitting: Oncology

## 2020-08-21 ENCOUNTER — Other Ambulatory Visit: Payer: Self-pay

## 2020-08-21 VITALS — BP 121/80 | HR 67 | Temp 97.2°F | Wt 154.2 lb

## 2020-08-21 DIAGNOSIS — I2510 Atherosclerotic heart disease of native coronary artery without angina pectoris: Secondary | ICD-10-CM | POA: Insufficient documentation

## 2020-08-21 DIAGNOSIS — C642 Malignant neoplasm of left kidney, except renal pelvis: Secondary | ICD-10-CM | POA: Insufficient documentation

## 2020-08-21 DIAGNOSIS — Z905 Acquired absence of kidney: Secondary | ICD-10-CM | POA: Insufficient documentation

## 2020-08-21 DIAGNOSIS — Z79899 Other long term (current) drug therapy: Secondary | ICD-10-CM | POA: Insufficient documentation

## 2020-08-21 DIAGNOSIS — Z5111 Encounter for antineoplastic chemotherapy: Secondary | ICD-10-CM

## 2020-08-21 DIAGNOSIS — I5042 Chronic combined systolic (congestive) and diastolic (congestive) heart failure: Secondary | ICD-10-CM | POA: Insufficient documentation

## 2020-08-21 DIAGNOSIS — I10 Essential (primary) hypertension: Secondary | ICD-10-CM

## 2020-08-21 DIAGNOSIS — Z5112 Encounter for antineoplastic immunotherapy: Secondary | ICD-10-CM

## 2020-08-21 DIAGNOSIS — I130 Hypertensive heart and chronic kidney disease with heart failure and stage 1 through stage 4 chronic kidney disease, or unspecified chronic kidney disease: Secondary | ICD-10-CM | POA: Insufficient documentation

## 2020-08-21 DIAGNOSIS — I255 Ischemic cardiomyopathy: Secondary | ICD-10-CM | POA: Insufficient documentation

## 2020-08-21 DIAGNOSIS — N1832 Chronic kidney disease, stage 3b: Secondary | ICD-10-CM | POA: Insufficient documentation

## 2020-08-21 DIAGNOSIS — Z8679 Personal history of other diseases of the circulatory system: Secondary | ICD-10-CM

## 2020-08-21 DIAGNOSIS — E785 Hyperlipidemia, unspecified: Secondary | ICD-10-CM | POA: Insufficient documentation

## 2020-08-21 DIAGNOSIS — Z7901 Long term (current) use of anticoagulants: Secondary | ICD-10-CM | POA: Insufficient documentation

## 2020-08-21 DIAGNOSIS — D631 Anemia in chronic kidney disease: Secondary | ICD-10-CM | POA: Insufficient documentation

## 2020-08-21 DIAGNOSIS — Z9861 Coronary angioplasty status: Secondary | ICD-10-CM | POA: Insufficient documentation

## 2020-08-21 LAB — COMPREHENSIVE METABOLIC PANEL
ALT: 14 U/L (ref 0–44)
AST: 21 U/L (ref 15–41)
Albumin: 3.9 g/dL (ref 3.5–5.0)
Alkaline Phosphatase: 94 U/L (ref 38–126)
Anion gap: 9 (ref 5–15)
BUN: 43 mg/dL — ABNORMAL HIGH (ref 6–20)
CO2: 26 mmol/L (ref 22–32)
Calcium: 8.9 mg/dL (ref 8.9–10.3)
Chloride: 103 mmol/L (ref 98–111)
Creatinine, Ser: 2.15 mg/dL — ABNORMAL HIGH (ref 0.61–1.24)
GFR, Estimated: 35 mL/min — ABNORMAL LOW (ref >60)
Glucose, Bld: 105 mg/dL — ABNORMAL HIGH (ref 70–99)
Potassium: 4.8 mmol/L (ref 3.5–5.1)
Sodium: 138 mmol/L (ref 135–145)
Total Bilirubin: 0.4 mg/dL (ref 0.3–1.2)
Total Protein: 6.6 g/dL (ref 6.5–8.1)

## 2020-08-21 LAB — CBC WITH DIFFERENTIAL/PLATELET
Abs Immature Granulocytes: 0.03 10*3/uL (ref 0.00–0.07)
Basophils Absolute: 0 10*3/uL (ref 0.0–0.1)
Basophils Relative: 0 %
Eosinophils Absolute: 0.6 10*3/uL — ABNORMAL HIGH (ref 0.0–0.5)
Eosinophils Relative: 6 %
HCT: 39.9 % (ref 39.0–52.0)
Hemoglobin: 12.6 g/dL — ABNORMAL LOW (ref 13.0–17.0)
Immature Granulocytes: 0 %
Lymphocytes Relative: 20 %
Lymphs Abs: 1.9 10*3/uL (ref 0.7–4.0)
MCH: 27.5 pg (ref 26.0–34.0)
MCHC: 31.6 g/dL (ref 30.0–36.0)
MCV: 86.9 fL (ref 80.0–100.0)
Monocytes Absolute: 1.1 10*3/uL — ABNORMAL HIGH (ref 0.1–1.0)
Monocytes Relative: 12 %
Neutro Abs: 5.9 10*3/uL (ref 1.7–7.7)
Neutrophils Relative %: 62 %
Platelets: 173 10*3/uL (ref 150–400)
RBC: 4.59 MIL/uL (ref 4.22–5.81)
RDW: 14.3 % (ref 11.5–15.5)
WBC: 9.5 10*3/uL (ref 4.0–10.5)
nRBC: 0 % (ref 0.0–0.2)

## 2020-08-21 MED ORDER — SODIUM CHLORIDE 0.9 % IV SOLN
200.0000 mg | Freq: Once | INTRAVENOUS | Status: AC
  Administered 2020-08-21: 200 mg via INTRAVENOUS
  Filled 2020-08-21: qty 8

## 2020-08-21 MED ORDER — SODIUM CHLORIDE 0.9 % IV SOLN
Freq: Once | INTRAVENOUS | Status: AC
  Filled 2020-08-21: qty 250

## 2020-08-21 NOTE — Patient Instructions (Signed)
[facility]  Discharge Instructions: Thank you for choosing [facility] to provide your oncology and hematology care.  If you have a lab appointment with the [facility], please go directly to the [facility] and check in at the registration area.  Wear comfortable clothing and clothing appropriate for easy access to any Portacath or PICC line.   Pembrolizumab injection Qu es este medicamento? El PEMBROLIZUMAB es un anticuerpo monoclonal. [place] para tratar ciertos tiposde cncer. Este medicamento puede ser utilizado para otros usos; si tiene Engineer, petroleum preguntaconsulte con su proveedor de atencin mdica o con su farmacutico. MARCAS COMUNES: Keytruda Qu le debo informar a mi profesional de la salud antes de tomar estemedicamento? Necesitan saber si usted presenta alguno de los siguientes problemas osituaciones: enfermedades autoinmunes tales como enfermedad de Crohn, colitis ulcerativa o lupus ha tenido o planea tener un trasplante alognico de Social research officer, government ([place] las clulas madre de Theatre manager persona) antecedentes de trasplante de rganos antecedentes de radiacin en el pecho problemas del sistema nervioso, tales como miastenia grave o sndrome de Curator una reaccin alrgica o inusual al pembrolizumab, a otros medicamentos, alimentos, colorantes o conservantes si est embarazada o buscando quedar embarazada si estamamantando a un beb Cmo debo [employer]? Este medicamento se administra mediante infusin en una vena. Lo administra unprofesional de [employer]	[place] en un hospital o en un entorno clnico. Se le entregar una Gua del medicamento (MedGuide, su nombre en ingls) especial antes de cada tratamiento. Asegrese de leer esta informacin cada vezcuidadosamente. Hable con su pediatra para informarse acerca del uso de este medicamento en nios. Aunque este medicamento se puede recetar a nios tan pequeos como de 30meses de  edad con ciertas afecciones, existen precauciones que deben tomarse. Sobredosis: Pngase en contacto inmediatamente con un centro toxicolgico o unasala de urgencia si usted cree que haya tomado demasiado medicamento. ATENCIN: [employer] es solo para usted. No comparta este medicamento connadie. Qu sucede si me olvido de una dosis? Es importante no olvidar ninguna dosis. Informe a su mdico o a su profesionalde la salud si no puede asistir a una cita. Qu puede interactuar con este medicamento? No se han estudiado las interacciones. Puede ser que esta lista no menciona todas las posibles interacciones. Informe a su profesional de [employer]	[place] de [employer] productos a base de hierbas, medicamentos de [place] o suplementos nutritivos que est tomando. Si usted fuma, consume bebidas alcohlicas o si utiliza drogas ilegales, indqueselo tambin a su profesional de [employer]	[place]. Algunas sustancias pueden interactuar consu medicamento. A qu debo estar atento al usar [employer]? Se supervisar su estado de salud atentamente mientras reciba este medicamento. Usted podra necesitar realizarse anlisis de sangre mientras est usando estemedicamento. No debe quedar embarazada mientras est usando este medicamento o por 4 meses despus de dejar de usarlo. Las mujeres deben informar a su mdico si estn buscando quedar embarazadas o si creen que podran estar embarazadas. Existe la posibilidad de efectos secundarios graves en un beb sin nacer. Para obtener ms informacin, hable con su profesional de la salud o su farmacutico. No debe Economist a un beb mientras est usando este medicamento o durante 38meses despus de la ltima dosis. Qu efectos secundarios puedo tener al [employer] este medicamento? Efectos secundarios que debe informar a su mdico o a su profesional de lasalud tan pronto como sea posible: Chief of Staff, tales como erupcin cutnea, comezn/picazn o urticaria, e hinchazn  de la cara, los labios o  la lengua con sangre o de color negro y aspecto alquitranado problemas para respirar cambios en la visin dolor en el pecho escalofros confusin estreimiento tos diarrea mareo, sensacin de desmayo o aturdimiento frecuencia cardiaca rpida o irregular fiebre enrojecimiento dolor en las articulaciones recuentos sanguneos bajos: este medicamento podra reducir la cantidad de glbulos blancos, glbulos rojos y plaquetas. Su riesgo de infeccin y sangrado podra ser mayor. dolor muscular debilidad muscular dolor, hormigueo o entumecimiento de las manos o los pies dolor de cabeza persistente enrojecimiento, formacin de ampollas, descamacin o distensin de la piel, incluso dentro de la boca signos y sntomas de niveles elevados de azcar en la sangre, tales como mareo; boca seca; piel seca; aliento frutal; nuseas; dolor de estmago; aumento del apetito o la sed; aumento de la frecuencia urinaria signos y sntomas de lesin renal, tales como dificultad para Garment/textile technologist o cambios en la cantidad de orina signos y sntomas de lesin en el hgado, como orina oscura, heces claras, prdida del apetito, nuseas, dolor en la regin abdominal superior derecha, color amarillento delos ojos o la piel sudoracin ganglios linfticos inflamados prdida de peso Efectos secundarios que generalmente no requieren atencin mdica (infrmelos asu mdico o a su profesional de la salud si persisten o si son molestos): disminucin del apetito cada del cabello cansancio Puede ser que esta lista no menciona todos los posibles efectos secundarios. Comunquese a su mdico por asesoramiento mdico [employer]. Usted puede informar los efectos secundarios a la FDA por telfono al1-800-FDA-1088. Dnde debo guardar mi medicina? Este medicamento se administra en hospitales o clnicas, y no necesitarguardarlo en su domicilio. ATENCIN: Este folleto es un resumen. Puede ser que no cubra toda la posible  informacin. Si usted tiene preguntas acerca de esta medicina, consulte con sumdico, su farmacutico o su profesional de Technical sales engineer.  2022 Elsevier/Gold Standard (2019-05-05 00:00:00)  We strive to give you quality time with your provider. You may need to reschedule your appointment if you arrive late (15 or more minutes).  Arriving late affects you and other patients whose appointments are after yours.  Also, if you miss three or more appointments without notifying the office, you may be dismissed from the clinic at the provider's discretion.      For prescription refill requests, have your pharmacy contact our office and allow 72 hours for refills to be completed.    Today you received the following chemotherapy and/or immunotherapy agents Keytruda   To help prevent nausea and vomiting after your treatment, we encourage you to take your nausea medication as directed.  BELOW ARE SYMPTOMS THAT SHOULD BE REPORTED IMMEDIATELY: *FEVER GREATER THAN 100.4 F (38 C) OR HIGHER *CHILLS OR SWEATING *NAUSEA AND VOMITING THAT IS NOT CONTROLLED WITH YOUR NAUSEA MEDICATION *UNUSUAL SHORTNESS OF BREATH *UNUSUAL BRUISING OR BLEEDING *URINARY PROBLEMS (pain or burning when urinating, or frequent urination) *BOWEL PROBLEMS (unusual diarrhea, constipation, pain near the anus) TENDERNESS IN MOUTH AND THROAT WITH OR WITHOUT PRESENCE OF ULCERS (sore throat, sores in mouth, or a toothache) UNUSUAL RASH, SWELLING OR PAIN  UNUSUAL VAGINAL DISCHARGE OR ITCHING   Items with * indicate a potential emergency and should be followed up as soon as possible or go to the Emergency Department if any problems should occur.  Please show the CHEMOTHERAPY ALERT CARD or IMMUNOTHERAPY ALERT CARD at check-in to the Emergency Department and triage nurse.  Should you have questions after your visit or need to cancel or reschedule your appointment, please contact [facility]  [phone] and  follow the prompts.  Office hours are 8:00 a.m. to 4:30 p.m. Monday - Friday. Please note that voicemails left after 4:00 p.m. may not be returned until the following business day.  We are closed weekends and major holidays. You have access to a nurse at all times for urgent questions. Please call the main number to the clinic [phone] and follow the prompts.  For any non-urgent questions, you may also contact your provider using MyChart. We now offer e-Visits for anyone 31 and older to request care online for non-urgent symptoms. For details visit [email or web address].   Also download the MyChart app! Go to the app store, search "MyChart", open the app, select [facility], and log in with your MyChart username and password.  Due to Covid, a mask is required upon entering the hospital/clinic. If you do not have a mask, one will be given to you upon arrival. For doctor visits, patients may have 1 support person aged 22 or older with them. For treatment visits, patients cannot have anyone with them due to current Covid guidelines and our immunocompromised population.

## 2020-08-21 NOTE — Progress Notes (Signed)
Hematology/Oncology Progress Note [facility] [phone] [phone]  Patient Care Team: [facility], [facility] as PCP - General (General Practice)   Name of the patient: Mark Donaldson  [id number]  06/18/1960  Date of visit: 08/21/20   INTERVAL HISTORY-  60 y.o. male presents for follow-up of renal cell carcinoma treatments. Patient has past medical history CAD status post PCI to LAD in 2018, on aspirin and Plavix, CHF, history of focal sclerosing/crescentic GN-ANCA positive and CKD stage III. He was hospitalized from 05/02/2019-05/07/2019 due to gross hematuria. CT showed a 7.4 cm left lower pole renal mass concerning for RCC.  Patient also has necrotic lymphadenopathy in the mediastinum and the right supra hilar region which were highly suspicious for metastatic disease.  Bilateral lung nodules, also concerning for metastatic lung disease.  Right retrocrural lymphadenopathy with upper normal left periaortic lymph node. Patient was recommended for cytoreductive radical nephrectomy.  With history of ANCA positive vasculitis, also recommend thoracic lymphadenopathy biopsy to confirm distal metastasis.  Patient agreed to radical nephrectomy and declined bronchoscopy biopsy. He underwent left radical nephrectomy on 05/05/2019. Pathology showed pT3a pNx, RCC, conventional clear cell type, grade 3 Patient was discharged home. Today he presents to discuss pathology and the management plan. He was accompanied by his wife and daughter. Patient reports that he uses hydrocodone for pain.  Pain around the surgical site has improved.  He occasionally has tingling sensation around the surgical site.  Denies any fever or chills, drainage from the surgical site.  # Interval CT chest abdomen pelvis was independently reviewed by me and discussed with patient. 01/12/2020, CT showed overall marked improvement.  Comparing to CT scan in August 2021,  continues to have treatment response.Mediastinal soft tissue nodule nearly completely resolved, right minor fissure nodule 8 mm, decreased in size.  Resolution of pleural fluid and thickening of the right chest.  Small pleural effusion in the left chest.  No new lung nodules.-Subtle variation of the muscle architecture of the left psoas muscle of unknown significance.-Continue observation.  He is asymptomatic. Subtle nodule in the soft tissue of the left flank.  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 He finished 14 days of albendazole plus praziquantel .   He has had few side effects including poor appetite, hair loss, weight loss, bloody stool, abdominal cramping or diarrhea. All symptoms have improved since he completed the treatment. 03/16/2020 he finished tapering course of steroids  # It has been noted that his glucose level has been elevated in December 2021, even before he was started on prednisone treatments. Dr. Steva Ready has obtained A1c which came back elevated at 8.7.  # 04/30/2020, CT chest abdomen pelvis without contrast showed stable disease.   #04/30/2020, axitinib and immunotherapy were held due to diarrhea and fatigue. #05/15/2020 resumed on immunotherapy.   INTERVAL HISTORY Mark Donaldson is a 60 y.o. male who has above history reviewed by me today presents for follow up visit for management of metastatic RCC Problems and complaints are listed below: Patient is Spanish-speaking.   Spanish interpreter present for the entire encounter for translation Diarrhea has completely resolved.  No new complaints.  He feels well.    Review of systems- Review of Systems  Constitutional:  Negative for appetite change, chills, fatigue, fever and unexpected weight change.  HENT:   Negative for voice change.   Eyes:  Negative for eye problems and icterus.  Respiratory:  Negative for chest tightness and cough.   Cardiovascular:  Negative for chest pain and leg  swelling.  Gastrointestinal:  Negative for abdominal distention, abdominal pain and diarrhea.  Endocrine: Negative for hot flashes.  Genitourinary:  Negative for difficulty urinating, dysuria and frequency.   Musculoskeletal:  Negative for arthralgias.  Skin:  Negative for itching and rash.  Neurological:  Negative for light-headedness and numbness.  Hematological:  Negative for adenopathy. Does not bruise/bleed easily.  Psychiatric/Behavioral:  Negative for confusion.    No Known Allergies  Patient Active Problem List   Diagnosis Date Noted   History of CHF (congestive heart failure) 05/01/2020   Hyperglycemia 02/28/2020   Stage 3b chronic kidney disease ([place]) 10/04/2019   Brain lesion 10/04/2019   Encounter for antineoplastic immunotherapy 07/19/2019   Encounter for antineoplastic chemotherapy 07/19/2019   CNS lesion 07/07/2019   Anemia in stage 3b chronic kidney disease ([place]) 06/07/2019   Goals of care, counseling/discussion 05/23/2019   Renal cell carcinoma of left kidney ([place]) 05/23/2019   Renal cell carcinoma ([place]) 05/23/2019   Palliative care encounter    History of vasculitis    Thoracic lymphadenopathy    Lung nodule    CAD S/P percutaneous coronary angioplasty 05/02/2019   Kidney mass 05/02/2019   Hydronephrosis of left kidney 05/02/2019   UTI (urinary tract infection) 05/02/2019   AKI (acute kidney injury) ([place]) 05/02/2019   Gross hematuria 05/02/2019   Chronic diastolic CHF (congestive heart failure) ([place]) 05/02/2019   Sepsis ([place]) 05/02/2019   Severe sepsis ([place]) 05/02/2019   Preop cardiovascular exam    Hyperlipidemia 05/21/2016   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 ([place]) 05/21/2016   Prediabetes 05/21/2016   Alcohol abuse 05/18/2016   Essential hypertension 05/18/2016   Vasculitis, 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[place]) 05/18/2016   Shortness of breath 05/17/2016     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HCC)    CAD (coronary  artery disease)    Essential hypertension    HFrEF (heart failure with reduced ejection fraction) (HCC)    Hyperlipidemia LDL goal <70    Ischemic cardiomyopathy    Renal cell carcinoma ([place]) 05/23/2019   Renal disorder      Past Surgical History:  Procedure Laterality Date   CARDIAC SURGERY     cardiac cath with 2 stent placement   LAPAROSCOPIC NEPHRECTOMY, HAND ASSISTED Left 05/05/2019   Procedure: HAND ASSISTED LAPAROSCOPIC NEPHRECTOMY;  Surgeon: Hollice Espy, MD;  Location: [facility] ORS;  Service: Urology;  Laterality: Left;    Social History   Socioeconomic History   Marital status: Married    Spouse name: Not on file   Number of children: Not on file   Years of education: Not on file   Highest education level: Not on file  Occupational History   Not on file  Tobacco Use   Smoking status: Never   Smokeless tobacco: Never  Substance and Sexual Activity   Alcohol use: Yes   Drug use: Not on file   Sexual activity: Not on file  Other Topics Concern   Not on file  Social History Narrative   Not on file   Social Determinants of Health   Financial Resource Strain: Not on file  Food Insecurity: Not on file  Transportation Needs: Not on file  Physical Activity: Not on file  Stress: Not on file  Social Connections: Not on file  Intimate Partner Violence: Not on file     Family History  Problem Relation Age of Onset   Cancer Mother    Blindness  Brother      Current Outpatient Medications:    aspirin 81 MG EC tablet, Take 81 mg by mouth daily. Swallow whole., Disp: , Rfl:    atorvastatin (LIPITOR) 80 MG tablet, Take 80 mg by mouth daily., Disp: , Rfl:    clopidogrel (PLAVIX) 75 MG tablet, Take 75 mg by mouth daily., Disp: , Rfl:    furosemide (LASIX) 20 MG tablet, Take 1 tablet (20 mg total) by mouth daily., Disp: 30 tablet, Rfl: 3   lisinopril (ZESTRIL) 10 MG tablet, Take 10 mg by mouth daily., Disp: , Rfl:    loperamide (IMODIUM) 2 MG capsule, Take 1 capsule  (2 mg total) by mouth See admin instructions. Take 2 tablets with onset of diarrhea, then 1 tablet for every 2 hours until diarrhea stops. Maximum 8 tablets per 24 hours., Disp: 60 capsule, Rfl: 0   axitinib (INLYTA) 5 MG tablet, Take 1 tablet (5 mg total) by mouth 2 (two) times daily. (Patient not taking: Reported on 08/16/2020), Disp: 60 tablet, Rfl: 3   KEYTRUDA 100 MG/4ML SOLN, INJECT 200MG  INTRAVENOUSLY EVERY 3 WEEKS (Patient not taking: Reported on 08/16/2020), Disp: 8 mL, Rfl: 11   metoprolol succinate (TOPROL-XL) 100 MG 24 hr tablet, Take 1.5 tablets by mouth daily., Disp: , Rfl:    Physical exam:  Vitals:   08/21/20 0857  BP: 121/80  Pulse: 67  Temp: (!) 97.2 F (36.2 C)  SpO2: 99%  Weight: 154 lb 3 oz (69.9 kg)   Physical Exam Constitutional:      General: He is not in acute distress.    Appearance: He is not diaphoretic.  HENT:     Head: Normocephalic and atraumatic.     Nose: Nose normal.     Mouth/Throat:     Pharynx: No oropharyngeal exudate.  Eyes:     General: No scleral icterus.    Pupils: Pupils are equal, round, and reactive to light.  Cardiovascular:     Rate and Rhythm: Normal rate and regular rhythm.     Heart sounds: No murmur heard. Pulmonary:     Effort: Pulmonary effort is normal. No respiratory distress.     Breath sounds: No rales.  Chest:     Chest wall: No tenderness.  Abdominal:     General: There is no distension.     Palpations: Abdomen is soft.     Tenderness: There is no abdominal tenderness.  Musculoskeletal:        General: Normal range of motion.     Cervical back: Normal range of motion and neck supple.  Skin:    General: Skin is warm and dry.     Findings: No erythema.  Neurological:     Mental Status: He is alert and oriented to person, place, and time.     Cranial Nerves: No cranial nerve deficit.     Motor: No abnormal muscle tone.     Coordination: Coordination normal.  Psychiatric:        Mood and Affect: Affect normal.        CMP Latest Ref Rng & Units 08/21/2020  Glucose 70 - 99 mg/dL 105(H)  BUN 6 - 20 mg/dL 43(H)  Creatinine 0.61 - 1.24 mg/dL 2.15(H)  Sodium 135 - 145 mmol/L 138  Potassium 3.5 - 5.1 mmol/L 4.8  Chloride 98 - 111 mmol/L 103  CO2 22 - 32 mmol/L 26  Calcium 8.9 - 10.3 mg/dL 8.9  Total Protein 6.5 - 8.1 g/dL 6.6  Total Bilirubin 0.3 -  1.2 mg/dL 0.4  Alkaline Phos 38 - 126 U/L 94  AST 15 - 41 U/L 21  ALT 0 - 44 U/L 14   CBC Latest Ref Rng & Units 08/21/2020  WBC 4.0 - 10.5 K/uL 9.5  Hemoglobin 13.0 - 17.0 g/dL 12.6(L)  Hematocrit 39.0 - 52.0 % 39.9  Platelets 150 - 400 K/uL 173    RADIOGRAPHIC STUDIES: I have personally reviewed the radiological images as listed and agreed with the findings in the report. MR BRAIN W WO CONTRAST  Result Date: 08/13/2020 CLINICAL DATA:  Neurocysticercosis B69.0 (ICD-10-CM). Yearly follow-up for brain lesions. EXAM: MRI HEAD WITHOUT AND WITH CONTRAST TECHNIQUE: Multiplanar, multiecho pulse sequences of the brain and surrounding structures were obtained without and with intravenous contrast. CONTRAST:  5mL GADAVIST GADOBUTROL 1 MMOL/ML IV SOLN COMPARISON:  MRI of the brain June 22, 2018 FINDINGS: Brain: No acute infarction, hemorrhage, hydrocephalus, or extra-axial collection. Extra-axial cystic lesions are again seen, consistent with the known diagnosis of neurocysticercosis. The lesions show rim enhancing walls, some of them with mild surrounding vasogenic edema, suggesting granular nodular stage: 1. Left frontal region, series 18, image 116, 6 mm, 5 mm on prior. 2. Interhemispheric fissure, series 18, image 113, 9 mm, new from prior. 3. Right parietal region, series 18, image 110, mild surrounding vasogenic edema, 15 mm, 20 mm on prior. 4. Right posterior parietal region, series 18, image 99, mild surrounding vasogenic edema, 17 mm, 21 mm on prior. 5. Left basal ganglia region, series 18, image 81, 5 mm, 5 mm on prior. Additionally, multiple foci of  susceptibility artifact are seen scattered throughout the bilateral cysts cerebral and cerebellar hemispheres, suggesting nodular calcified lesions, similar to prior MRI. Vascular: Normal flow voids. Skull and upper cervical spine: Normal marrow signal. Sinuses/Orbits: Small mucosal retention cyst in the right maxillary sinus. Mild mucosal thickening of the bilateral ethmoid cells. The orbits are maintained. Other: None. IMPRESSION: 1. Findings consistent with the clinical diagnosis of neurosurgeons neurocysticercosis. 2. Cystic lesions are similar to decreased in size when compared to prior MRI except for 1 interhemispheric fissure lesion which it is new. However, they are more consistent with granular nodular stage. 3. Calcified lesions are similar to prior MRI. Electronically Signed   By: Pedro Earls M.D.   On: 08/13/2020 17:41   CT CHEST ABDOMEN PELVIS WO CONTRAST  Result Date: 08/01/2020 CLINICAL DATA:  History of renal cell carcinoma status post left nephrectomy April 2021. Ongoing therapy. EXAM: CT CHEST, ABDOMEN AND PELVIS WITHOUT CONTRAST TECHNIQUE: Multidetector CT imaging of the chest, abdomen and pelvis was performed following the standard protocol without IV contrast. COMPARISON:  Multiple priors including most recent CT April 18,2022. FINDINGS: CT CHEST FINDINGS Cardiovascular: No thoracic aortic aneurysm. No aortic atherosclerosis. Three-vessel coronary artery calcifications. Normal size heart. No significant pericardial effusion/thickening. Mediastinum/Nodes: No pathologically enlarged mediastinal hilar lymph nodes. No significant change in the paramediastinal soft tissue nodularity for instance a small soft tissue nodule along the right anterior mediastinal margin measures 4 mm on image 24/2, unchanged. No new or enlarging lesions. No discrete thyroid gland nodule. Trachea and esophagus are grossly unremarkable. Lungs/Pleura: Decreased size of the pulmonary nodule along the  minor fissure in the right chest now measuring 6 mm on image 75/4, previously 8 mm. No new suspicious pulmonary nodules or masses. Mild centrilobular emphysema. Diffuse bronchial thickening with diffuse ground-glass micro nodularity for instance in the anterior upper lobes on image 60/3 and and posterior lower lobes 97/3, favored infectious or  inflammatory. No pleural effusion. No pneumothorax. Musculoskeletal: Multilevel degenerative changes of the spine. No aggressive lytic or blastic lesion of bone. CT ABDOMEN PELVIS FINDINGS Hepatobiliary: Unremarkable noncontrast appearance the hepatic parenchyma. Gallbladder is. No ductal dilation. Pancreas:  Within normal limits. Spleen:  Within normal limits. Adrenals/Urinary Tract: Bilateral adrenal glands are unremarkable. Status post left nephrectomy without new suspicious soft tissue nodularity in the nephrectomy bed. No right-sided hydronephrosis or contour deforming renal mass. Urinary bladder is grossly unremarkable for degree of distension. Stomach/Bowel: Stomach is grossly unremarkable for degree of distension. Normal positioning of the duodenum/ligament of [place]. No suspicious small bowel dilation. Appendix and terminal ileum appear normal. Radio opaque enteric contrast traverses the rectum. Left-sided colonic diverticulosis without findings of acute diverticulitis. Vascular/Lymphatic: No significant vascular findings are present. No pathologically enlarged abdominal or pelvic lymph nodes. Reproductive: Mildly enlarged prostate. Other: No abdominopelvic ascites.  No discrete peritoneal nodules. Musculoskeletal: Multilevel degenerative changes spine. 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 No aggressive lytic or blastic lesion of bone. IMPRESSION: 1. Overall stable examination status post LEFT nephrectomy without evidence of local recurrence. No evidence of new or enlarging metastatic disease in the chest, abdomen or pelvis. 2. Slightly decreased  size of the now 6 mm pulmonary nodule along the minor fissure in the right chest with stable tiny soft tissue nodularity in the anterior mediastinum corresponding with prior necrotic lymph nodes. 3. Diffuse bronchial thickening and ground-glass micro nodularity, favored infectious or inflammatory. However, while this is not a typical pattern for drug related lung injury, in a patient on immunotherapy it is still a differential consideration. 4. Left-sided colonic diverticulosis without findings of acute diverticulitis. 5. 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 6. Aortic Atherosclerosis (ICD10-I70.0) and Emphysema (ICD10-J43.9). Electronically Signed   By: Dahlia Bailiff MD   On: 08/01/2020 08:33     Assessment and plan-  Patient is a 60 y.o. male history of CAD, status post stent, CHF, history of ANCA positive sclerosing/crescentic GN, chronic kidney disease stage III presents for treatments of stage IV renal cell carcinoma 1. Renal cell carcinoma of left kidney (HCC)   2. Encounter for antineoplastic immunotherapy   3. History of CHF (congestive heart failure)   4. Stage 3b chronic kidney disease ([place])   5. Encounter for antineoplastic chemotherapy   6. Primary hypertension   Cancer Staging Renal cell carcinoma of left kidney (HCC) Staging form: Kidney, AJCC 8th Edition - Pathologic: Stage IV (pT3a, pNX, cM1) - Signed by Earlie Server, MD on 05/23/2019   # Stage IV renal cell carcinoma 07/31/2020, CT chest abdomen pelvis showed stable disease Resume Keytruda today.  Continue hold axitinib.  Plan to resume axitinib at the next visit if symptoms are stable.  # Diarrhea, probably due to immunotherapy and/or Axitinib.  Negative C diff and GI profile.  Discussed about imodium instructions.   #Hypertension, CHF,  Continue metoprolol, lisinopril.  Lasix Stable.  #Intermittent orthopnea, chronic issue for him.  Symptoms has resolved.  Continue  Lasix Pre-existing diastolic CHF, 3/66/2947 LVEF 60 to 65%.   Recommend him to follow-up with cardiology-he is has not able to follow-up with cardiology due to lack of insurance coverage.  #Brain lesion-  neurocysticercosis finished therapy.  Follow-up with ID There is a new lesion, Dr.Ravishanka plans to consult expert and let patient know.  #CKD, creatinine stable with stable creatinine.  Encourage oral hydration.  Avoid nephrotoxin.    Online Spanish interpreter presented for the entire encounter for translation.  We spent sufficient time to discuss many aspect of care, questions were answered to patient's satisfaction. Follow-up in 3weeks for follow-up of evaluation prior to immunotherapy.  Earlie Server, MD, PhD Hematology Oncology [facility] Pager- [phone] 08/21/2020

## 2020-09-11 ENCOUNTER — Inpatient Hospital Stay: Payer: Self-pay

## 2020-09-11 ENCOUNTER — Encounter: Payer: Self-pay | Admitting: Oncology

## 2020-09-11 ENCOUNTER — Inpatient Hospital Stay (HOSPITAL_BASED_OUTPATIENT_CLINIC_OR_DEPARTMENT_OTHER): Payer: Self-pay | Admitting: Oncology

## 2020-09-11 VITALS — BP 123/83 | HR 91 | Temp 98.8°F | Resp 18 | Wt 156.8 lb

## 2020-09-11 DIAGNOSIS — I10 Essential (primary) hypertension: Secondary | ICD-10-CM

## 2020-09-11 DIAGNOSIS — N1832 Chronic kidney disease, stage 3b: Secondary | ICD-10-CM

## 2020-09-11 DIAGNOSIS — Z8679 Personal history of other diseases of the circulatory system: Secondary | ICD-10-CM

## 2020-09-11 DIAGNOSIS — Z5111 Encounter for antineoplastic chemotherapy: Secondary | ICD-10-CM

## 2020-09-11 DIAGNOSIS — C642 Malignant neoplasm of left kidney, except renal pelvis: Secondary | ICD-10-CM

## 2020-09-11 DIAGNOSIS — Z5112 Encounter for antineoplastic immunotherapy: Secondary | ICD-10-CM

## 2020-09-11 LAB — CBC WITH DIFFERENTIAL/PLATELET
Abs Immature Granulocytes: 0.03 10*3/uL (ref 0.00–0.07)
Basophils Absolute: 0.1 10*3/uL (ref 0.0–0.1)
Basophils Relative: 1 %
Eosinophils Absolute: 0.4 10*3/uL (ref 0.0–0.5)
Eosinophils Relative: 4 %
HCT: 41.2 % (ref 39.0–52.0)
Hemoglobin: 13 g/dL (ref 13.0–17.0)
Immature Granulocytes: 0 %
Lymphocytes Relative: 23 %
Lymphs Abs: 1.9 10*3/uL (ref 0.7–4.0)
MCH: 27.1 pg (ref 26.0–34.0)
MCHC: 31.6 g/dL (ref 30.0–36.0)
MCV: 86 fL (ref 80.0–100.0)
Monocytes Absolute: 1 10*3/uL (ref 0.1–1.0)
Monocytes Relative: 12 %
Neutro Abs: 4.9 10*3/uL (ref 1.7–7.7)
Neutrophils Relative %: 60 %
Platelets: 154 10*3/uL (ref 150–400)
RBC: 4.79 MIL/uL (ref 4.22–5.81)
RDW: 15 % (ref 11.5–15.5)
WBC: 8.2 10*3/uL (ref 4.0–10.5)
nRBC: 0 % (ref 0.0–0.2)

## 2020-09-11 LAB — COMPREHENSIVE METABOLIC PANEL
ALT: 19 U/L (ref 0–44)
AST: 26 U/L (ref 15–41)
Albumin: 4.3 g/dL (ref 3.5–5.0)
Alkaline Phosphatase: 87 U/L (ref 38–126)
Anion gap: 8 (ref 5–15)
BUN: 50 mg/dL — ABNORMAL HIGH (ref 6–20)
CO2: 22 mmol/L (ref 22–32)
Calcium: 9.1 mg/dL (ref 8.9–10.3)
Chloride: 108 mmol/L (ref 98–111)
Creatinine, Ser: 2.31 mg/dL — ABNORMAL HIGH (ref 0.61–1.24)
GFR, Estimated: 32 mL/min — ABNORMAL LOW (ref >60)
Glucose, Bld: 93 mg/dL (ref 70–99)
Potassium: 4.8 mmol/L (ref 3.5–5.1)
Sodium: 138 mmol/L (ref 135–145)
Total Bilirubin: 0.9 mg/dL (ref 0.3–1.2)
Total Protein: 7 g/dL (ref 6.5–8.1)

## 2020-09-11 MED ORDER — SODIUM CHLORIDE 0.9 % IV SOLN
Freq: Once | INTRAVENOUS | Status: DC
  Filled 2020-09-11: qty 250

## 2020-09-11 MED ORDER — SODIUM CHLORIDE 0.9 % IV SOLN
Freq: Once | INTRAVENOUS | Status: AC
Start: 2020-09-11 — End: 2020-09-11
  Filled 2020-09-11: qty 250

## 2020-09-11 MED ORDER — SODIUM CHLORIDE 0.9 % IV SOLN
200.0000 mg | Freq: Once | INTRAVENOUS | Status: AC
  Administered 2020-09-11: 200 mg via INTRAVENOUS
  Filled 2020-09-11: qty 8

## 2020-09-11 MED ORDER — AXITINIB 5 MG PO TABS: 5.0000 mg | ORAL_TABLET | Freq: Two times a day (BID) | ORAL | 1 refills | Status: DC

## 2020-09-11 NOTE — Progress Notes (Signed)
Pt here for follow up. Pt reports pain to right arm when lifting it.

## 2020-09-11 NOTE — Progress Notes (Signed)
Hematology/Oncology Progress Note [facility] [phone] [phone]  Patient Care Team: Center, [facility] as PCP - General (General Practice)   Name of the patient: Mark Donaldson  [id number]  July 19, 1960  Date of visit: 09/11/20   INTERVAL HISTORY-  60 y.o. male presents for follow-up of renal cell carcinoma treatments. Patient has past medical history CAD status post PCI to LAD in 2018, on aspirin and Plavix, CHF, history of focal sclerosing/crescentic GN-ANCA positive and CKD stage III. He was hospitalized from 05/02/2019-05/07/2019 due to gross hematuria. CT showed a 7.4 cm left lower pole renal mass concerning for RCC.  Patient also has necrotic lymphadenopathy in the mediastinum and the right supra hilar region which were highly suspicious for metastatic disease.  Bilateral lung nodules, also concerning for metastatic lung disease.  Right retrocrural lymphadenopathy with upper normal left periaortic lymph node. Patient was recommended for cytoreductive radical nephrectomy.  With history of ANCA positive vasculitis, also recommend thoracic lymphadenopathy biopsy to confirm distal metastasis.  Patient agreed to radical nephrectomy and declined bronchoscopy biopsy. He underwent left radical nephrectomy on 05/05/2019. Pathology showed pT3a pNx, RCC, conventional clear cell type, grade 3 Patient was discharged home. Today he presents to discuss pathology and the management plan. He was accompanied by his wife and daughter. Patient reports that he uses hydrocodone for pain.  Pain around the surgical site has improved.  He occasionally has tingling sensation around the surgical site.  Denies any fever or chills, drainage from the surgical site.  # Interval CT chest abdomen pelvis was independently reviewed by me and discussed with patient. 01/12/2020, CT showed overall marked improvement.  Comparing to CT scan in August 2021,  continues to have treatment response.Mediastinal soft tissue nodule nearly completely resolved, right minor fissure nodule 8 mm, decreased in size.  Resolution of pleural fluid and thickening of the right chest.  Small pleural effusion in the left chest.  No new lung nodules.-Subtle variation of the muscle architecture of the left psoas muscle of unknown significance.-Continue observation.  He is asymptomatic. Subtle nodule in the soft tissue of the left flank.  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 He finished 14 days of albendazole plus praziquantel .   He has had few side effects including poor appetite, hair loss, weight loss, bloody stool, abdominal cramping or diarrhea. All symptoms have improved since he completed the treatment. 03/16/2020 he finished tapering course of steroids  # It has been noted that his glucose level has been elevated in December 2021, even before he was started on prednisone treatments. Dr. Steva Ready has obtained A1c which came back elevated at 8.7.  # 04/30/2020, CT chest abdomen pelvis without contrast showed stable disease.   #04/30/2020, axitinib and immunotherapy were held due to diarrhea and fatigue. #05/15/2020 resumed on immunotherapy.   INTERVAL HISTORY Mark Donaldson is a 60 y.o. male who has above history reviewed by me today presents for follow up visit for management of metastatic RCC Problems and complaints are listed below: Patient is Spanish-speaking.   Online Spanish interpreter service was utilized for the entire encounter for translation Diarrhea has completely resolved.  No new complaints.  He feels well.   Review of systems- Review of Systems  Constitutional:  Negative for appetite change, chills, fatigue, fever and unexpected weight change.  HENT:   Negative for voice change.   Eyes:  Negative for eye problems and icterus.  Respiratory:  Negative for chest tightness and cough.  Cardiovascular:  Negative for chest  pain and leg swelling.  Gastrointestinal:  Negative for abdominal distention, abdominal pain and diarrhea.  Endocrine: Negative for hot flashes.  Genitourinary:  Negative for difficulty urinating, dysuria and frequency.   Musculoskeletal:  Negative for arthralgias.  Skin:  Negative for itching and rash.  Neurological:  Negative for light-headedness and numbness.  Hematological:  Negative for adenopathy. Does not bruise/bleed easily.  Psychiatric/Behavioral:  Negative for confusion.    No Known Allergies  Patient Active Problem List   Diagnosis Date Noted   History of CHF (congestive heart failure) 05/01/2020   Hyperglycemia 02/28/2020   Stage 3b chronic kidney disease ([place]) 10/04/2019   Brain lesion 10/04/2019   Encounter for antineoplastic immunotherapy 07/19/2019   Encounter for antineoplastic chemotherapy 07/19/2019   CNS lesion 07/07/2019   Anemia in stage 3b chronic kidney disease ([place]) 06/07/2019   Goals of care, counseling/discussion 05/23/2019   Renal cell carcinoma of left kidney ([place]) 05/23/2019   Renal cell carcinoma ([place]) 05/23/2019   Palliative care encounter    History of vasculitis    Thoracic lymphadenopathy    Lung nodule    CAD S/P percutaneous coronary angioplasty 05/02/2019   Kidney mass 05/02/2019   Hydronephrosis of left kidney 05/02/2019   UTI (urinary tract infection) 05/02/2019   AKI (acute kidney injury) ([place]) 05/02/2019   Gross hematuria 05/02/2019   Chronic diastolic CHF (congestive heart failure) ([place]) 05/02/2019   Sepsis ([place]) 05/02/2019   Severe sepsis ([name]) 05/02/2019   Preop cardiovascular exam    Hyperlipidemia 05/21/2016   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 ([place]) 05/21/2016   Prediabetes 05/21/2016   Alcohol abuse 05/18/2016   Essential hypertension 05/18/2016   Vasculitis, 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[place]) 05/18/2016   Shortness of breath 05/17/2016     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HCC)     CAD (coronary artery disease)    Essential hypertension    HFrEF (heart failure with reduced ejection fraction) (HCC)    Hyperlipidemia LDL goal <70    Ischemic cardiomyopathy    Renal cell carcinoma ([place]) 05/23/2019   Renal disorder      Past Surgical History:  Procedure Laterality Date   CARDIAC SURGERY     cardiac cath with 2 stent placement   LAPAROSCOPIC NEPHRECTOMY, HAND ASSISTED Left 05/05/2019   Procedure: HAND ASSISTED LAPAROSCOPIC NEPHRECTOMY;  Surgeon: Hollice Espy, MD;  Location: [facility] ORS;  Service: Urology;  Laterality: Left;    Social History   Socioeconomic History   Marital status: Married    Spouse name: Not on file   Number of children: Not on file   Years of education: Not on file   Highest education level: Not on file  Occupational History   Not on file  Tobacco Use   Smoking status: Never   Smokeless tobacco: Never  Substance and Sexual Activity   Alcohol use: Yes   Drug use: Not on file   Sexual activity: Not on file  Other Topics Concern   Not on file  Social History Narrative   Not on file   Social Determinants of Health   Financial Resource Strain: Not on file  Food Insecurity: Not on file  Transportation Needs: Not on file  Physical Activity: Not on file  Stress: Not on file  Social Connections: Not on file  Intimate Partner Violence: Not on file     Family History  Problem Relation Age of Onset   Cancer Mother  Blindness Brother      Current Outpatient Medications:    aspirin 81 MG EC tablet, Take 81 mg by mouth daily. Swallow whole., Disp: , Rfl:    atorvastatin (LIPITOR) 80 MG tablet, Take 80 mg by mouth daily., Disp: , Rfl:    clopidogrel (PLAVIX) 75 MG tablet, Take 75 mg by mouth daily., Disp: , Rfl:    furosemide (LASIX) 20 MG tablet, Take 1 tablet (20 mg total) by mouth daily., Disp: 30 tablet, Rfl: 3   KEYTRUDA 100 MG/4ML SOLN, INJECT 200MG  INTRAVENOUSLY EVERY 3 WEEKS, Disp: 8 mL, Rfl: 11   lisinopril (ZESTRIL) 10 MG  tablet, Take 10 mg by mouth daily., Disp: , Rfl:    metoprolol succinate (TOPROL-XL) 100 MG 24 hr tablet, Take 1.5 tablets by mouth daily., Disp: , Rfl:    axitinib (INLYTA) 5 MG tablet, Take 1 tablet (5 mg total) by mouth 2 (two) times daily., Disp: 60 tablet, Rfl: 1   loperamide (IMODIUM) 2 MG capsule, Take 1 capsule (2 mg total) by mouth See admin instructions. Take 2 tablets with onset of diarrhea, then 1 tablet for every 2 hours until diarrhea stops. Maximum 8 tablets per 24 hours. (Patient not taking: Reported on 09/11/2020), Disp: 60 capsule, Rfl: 0 No current facility-administered medications for this visit.  Facility-Administered Medications Ordered in Other Visits:    0.9 %  sodium chloride infusion, , Intravenous, Once, Earlie Server, MD   Physical exam:  Vitals:   09/11/20 1026  BP: 123/83  Pulse: 91  Resp: 18  Temp: 98.8 F (37.1 C)  Weight: 156 lb 12.8 oz (71.1 kg)   Physical Exam Constitutional:      General: He is not in acute distress.    Appearance: He is not diaphoretic.  HENT:     Head: Normocephalic and atraumatic.     Nose: Nose normal.     Mouth/Throat:     Pharynx: No oropharyngeal exudate.  Eyes:     General: No scleral icterus.    Pupils: Pupils are equal, round, and reactive to light.  Cardiovascular:     Rate and Rhythm: Normal rate and regular rhythm.     Heart sounds: No murmur heard. Pulmonary:     Effort: Pulmonary effort is normal. No respiratory distress.     Breath sounds: No rales.  Chest:     Chest wall: No tenderness.  Abdominal:     General: There is no distension.     Palpations: Abdomen is soft.     Tenderness: There is no abdominal tenderness.  Musculoskeletal:        General: Normal range of motion.     Cervical back: Normal range of motion and neck supple.  Skin:    General: Skin is warm and dry.     Findings: No erythema.  Neurological:     Mental Status: He is alert and oriented to person, place, and time.     Cranial Nerves:  No cranial nerve deficit.     Motor: No abnormal muscle tone.     Coordination: Coordination normal.  Psychiatric:        Mood and Affect: Affect normal.       CMP Latest Ref Rng & Units 09/11/2020  Glucose 70 - 99 mg/dL 93  BUN 6 - 20 mg/dL 50(H)  Creatinine 0.61 - 1.24 mg/dL 2.31(H)  Sodium 135 - 145 mmol/L 138  Potassium 3.5 - 5.1 mmol/L 4.8  Chloride 98 - 111 mmol/L 108  CO2 22 -  32 mmol/L 22  Calcium 8.9 - 10.3 mg/dL 9.1  Total Protein 6.5 - 8.1 g/dL 7.0  Total Bilirubin 0.3 - 1.2 mg/dL 0.9  Alkaline Phos 38 - 126 U/L 87  AST 15 - 41 U/L 26  ALT 0 - 44 U/L 19   CBC Latest Ref Rng & Units 09/11/2020  WBC 4.0 - 10.5 K/uL 8.2  Hemoglobin 13.0 - 17.0 g/dL 13.0  Hematocrit 39.0 - 52.0 % 41.2  Platelets 150 - 400 K/uL 154    RADIOGRAPHIC STUDIES: I have personally reviewed the radiological images as listed and agreed with the findings in the report. MR BRAIN W WO CONTRAST  Result Date: 08/13/2020 CLINICAL DATA:  Neurocysticercosis B69.0 (ICD-10-CM). Yearly follow-up for brain lesions. EXAM: MRI HEAD WITHOUT AND WITH CONTRAST TECHNIQUE: Multiplanar, multiecho pulse sequences of the brain and surrounding structures were obtained without and with intravenous contrast. CONTRAST:  64mL GADAVIST GADOBUTROL 1 MMOL/ML IV SOLN COMPARISON:  MRI of the brain June 22, 2018 FINDINGS: Brain: No acute infarction, hemorrhage, hydrocephalus, or extra-axial collection. Extra-axial cystic lesions are again seen, consistent with the known diagnosis of neurocysticercosis. The lesions show rim enhancing walls, some of them with mild surrounding vasogenic edema, suggesting granular nodular stage: 1. Left frontal region, series 18, image 116, 6 mm, 5 mm on prior. 2. Interhemispheric fissure, series 18, image 113, 9 mm, new from prior. 3. Right parietal region, series 18, image 110, mild surrounding vasogenic edema, 15 mm, 20 mm on prior. 4. Right posterior parietal region, series 18, image 99, mild surrounding  vasogenic edema, 17 mm, 21 mm on prior. 5. Left basal ganglia region, series 18, image 81, 5 mm, 5 mm on prior. Additionally, multiple foci of susceptibility artifact are seen scattered throughout the bilateral cysts cerebral and cerebellar hemispheres, suggesting nodular calcified lesions, similar to prior MRI. Vascular: Normal flow voids. Skull and upper cervical spine: Normal marrow signal. Sinuses/Orbits: Small mucosal retention cyst in the right maxillary sinus. Mild mucosal thickening of the bilateral ethmoid cells. The orbits are maintained. Other: None. IMPRESSION: 1. Findings consistent with the clinical diagnosis of neurosurgeons neurocysticercosis. 2. Cystic lesions are similar to decreased in size when compared to prior MRI except for 1 interhemispheric fissure lesion which it is new. However, they are more consistent with granular nodular stage. 3. Calcified lesions are similar to prior MRI. Electronically Signed   By: Pedro Earls M.D.   On: 08/13/2020 17:41   CT CHEST ABDOMEN PELVIS WO CONTRAST  Result Date: 08/01/2020 CLINICAL DATA:  History of renal cell carcinoma status post left nephrectomy April 2021. Ongoing therapy. EXAM: CT CHEST, ABDOMEN AND PELVIS WITHOUT CONTRAST TECHNIQUE: Multidetector CT imaging of the chest, abdomen and pelvis was performed following the standard protocol without IV contrast. COMPARISON:  Multiple priors including most recent CT April 18,2022. FINDINGS: CT CHEST FINDINGS Cardiovascular: No thoracic aortic aneurysm. No aortic atherosclerosis. Three-vessel coronary artery calcifications. Normal size heart. No significant pericardial effusion/thickening. Mediastinum/Nodes: No pathologically enlarged mediastinal hilar lymph nodes. No significant change in the paramediastinal soft tissue nodularity for instance a small soft tissue nodule along the right anterior mediastinal margin measures 4 mm on image 24/2, unchanged. No new or enlarging lesions. No  discrete thyroid gland nodule. Trachea and esophagus are grossly unremarkable. Lungs/Pleura: Decreased size of the pulmonary nodule along the minor fissure in the right chest now measuring 6 mm on image 75/4, previously 8 mm. No new suspicious pulmonary nodules or masses. Mild centrilobular emphysema. Diffuse bronchial thickening with  diffuse ground-glass micro nodularity for instance in the anterior upper lobes on image 60/3 and and posterior lower lobes 97/3, favored infectious or inflammatory. No pleural effusion. No pneumothorax. Musculoskeletal: Multilevel degenerative changes of the spine. No aggressive lytic or blastic lesion of bone. CT ABDOMEN PELVIS FINDINGS Hepatobiliary: Unremarkable noncontrast appearance the hepatic parenchyma. Gallbladder is. No ductal dilation. Pancreas:  Within normal limits. Spleen:  Within normal limits. Adrenals/Urinary Tract: Bilateral adrenal glands are unremarkable. Status post left nephrectomy without new suspicious soft tissue nodularity in the nephrectomy bed. No right-sided hydronephrosis or contour deforming renal mass. Urinary bladder is grossly unremarkable for degree of distension. Stomach/Bowel: Stomach is grossly unremarkable for degree of distension. Normal positioning of the duodenum/ligament of [place]. No suspicious small bowel dilation. Appendix and terminal ileum appear normal. Radio opaque enteric contrast traverses the rectum. Left-sided colonic diverticulosis without findings of acute diverticulitis. Vascular/Lymphatic: No significant vascular findings are present. No pathologically enlarged abdominal or pelvic lymph nodes. Reproductive: Mildly enlarged prostate. Other: No abdominopelvic ascites.  No discrete peritoneal nodules. Musculoskeletal: Multilevel degenerative changes spine. 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 No aggressive lytic or blastic lesion of bone. IMPRESSION: 1. Overall stable examination status post LEFT nephrectomy  without evidence of local recurrence. No evidence of new or enlarging metastatic disease in the chest, abdomen or pelvis. 2. Slightly decreased size of the now 6 mm pulmonary nodule along the minor fissure in the right chest with stable tiny soft tissue nodularity in the anterior mediastinum corresponding with prior necrotic lymph nodes. 3. Diffuse bronchial thickening and ground-glass micro nodularity, favored infectious or inflammatory. However, while this is not a typical pattern for drug related lung injury, in a patient on immunotherapy it is still a differential consideration. 4. Left-sided colonic diverticulosis without findings of acute diverticulitis. 5. 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 6. Aortic Atherosclerosis (ICD10-I70.0) and Emphysema (ICD10-J43.9). Electronically Signed   By: Dahlia Bailiff MD   On: 08/01/2020 08:33     Assessment and plan-  Patient is a 60 y.o. male history of CAD, status post stent, CHF, history of ANCA positive sclerosing/crescentic GN, chronic kidney disease stage III presents for treatments of stage IV renal cell carcinoma 1. Renal cell carcinoma of left kidney (HCC)   2. Encounter for antineoplastic immunotherapy   3. History of CHF (congestive heart failure)   4. Stage 3b chronic kidney disease ([place])   5. Encounter for antineoplastic chemotherapy   6. Primary hypertension   Cancer Staging Renal cell carcinoma of left kidney (HCC) Staging form: Kidney, AJCC 8th Edition - Pathologic: Stage IV (pT3a, pNX, cM1) - Signed by Earlie Server, MD on 05/23/2019   # Stage IV renal cell carcinoma 07/31/2020, CT chest abdomen pelvis showed stable disease Labs reviewed and discussed with patient. Proceed with Keytruda. Advised patient to resume axitinib 5 mg twice daily.   # Diarrhea, resolved.  #Hypertension, CHF,  Continue metoprolol, lisinopril.  Lasix Stable.  #Intermittent orthopnea, symptom has resolved.  Continue  Lasix Pre-existing diastolic CHF, 5/00/9381 LVEF 60 to 65%.   Recommend him to follow-up with cardiology-he is has not able to follow-up with cardiology due to lack of insurance coverage.  #Brain lesion-  neurocysticercosis finished therapy.  Follow-up with ID There is a new lesion, Dr.Ravishanka plans to consult expert and let patient know.  #CKD, creatinine is trending up.  Encourage oral hydration.  We will give him 1 L of IV fluid today. Avoid nephrotoxins.  Online Spanish interpreter presented  for the entire encounter for translation. We spent sufficient time to discuss many aspect of care, questions were answered to patient's satisfaction. Follow-up in 3weeks for follow-up of evaluation prior to immunotherapy.  Earlie Server, MD, PhD Hematology Oncology [facility] Pager- [phone] 09/11/2020

## 2020-09-12 ENCOUNTER — Other Ambulatory Visit: Payer: Self-pay

## 2020-09-12 DIAGNOSIS — C642 Malignant neoplasm of left kidney, except renal pelvis: Secondary | ICD-10-CM

## 2020-09-12 LAB — TSH: TSH: 2.412 u[IU]/mL (ref 0.350–4.500)

## 2020-09-12 LAB — T4, FREE: Free T4: 0.78 ng/dL (ref 0.61–1.12)

## 2020-10-02 ENCOUNTER — Inpatient Hospital Stay (HOSPITAL_BASED_OUTPATIENT_CLINIC_OR_DEPARTMENT_OTHER): Payer: Self-pay | Admitting: Oncology

## 2020-10-02 ENCOUNTER — Inpatient Hospital Stay: Payer: Self-pay

## 2020-10-02 ENCOUNTER — Encounter: Payer: Self-pay | Admitting: Oncology

## 2020-10-02 ENCOUNTER — Inpatient Hospital Stay: Payer: Self-pay | Attending: Oncology

## 2020-10-02 VITALS — BP 115/89 | HR 65 | Temp 97.8°F | Resp 20 | Wt 152.3 lb

## 2020-10-02 DIAGNOSIS — N1832 Chronic kidney disease, stage 3b: Secondary | ICD-10-CM | POA: Insufficient documentation

## 2020-10-02 DIAGNOSIS — Z5111 Encounter for antineoplastic chemotherapy: Secondary | ICD-10-CM

## 2020-10-02 DIAGNOSIS — R197 Diarrhea, unspecified: Secondary | ICD-10-CM

## 2020-10-02 DIAGNOSIS — C642 Malignant neoplasm of left kidney, except renal pelvis: Secondary | ICD-10-CM

## 2020-10-02 DIAGNOSIS — I10 Essential (primary) hypertension: Secondary | ICD-10-CM

## 2020-10-02 DIAGNOSIS — Z905 Acquired absence of kidney: Secondary | ICD-10-CM | POA: Insufficient documentation

## 2020-10-02 DIAGNOSIS — Z5112 Encounter for antineoplastic immunotherapy: Secondary | ICD-10-CM | POA: Insufficient documentation

## 2020-10-02 DIAGNOSIS — Z79899 Other long term (current) drug therapy: Secondary | ICD-10-CM | POA: Insufficient documentation

## 2020-10-02 DIAGNOSIS — I130 Hypertensive heart and chronic kidney disease with heart failure and stage 1 through stage 4 chronic kidney disease, or unspecified chronic kidney disease: Secondary | ICD-10-CM | POA: Insufficient documentation

## 2020-10-02 DIAGNOSIS — Z8679 Personal history of other diseases of the circulatory system: Secondary | ICD-10-CM

## 2020-10-02 LAB — COMPREHENSIVE METABOLIC PANEL
ALT: 17 U/L (ref 0–44)
AST: 23 U/L (ref 15–41)
Albumin: 4.2 g/dL (ref 3.5–5.0)
Alkaline Phosphatase: 96 U/L (ref 38–126)
Anion gap: 8 (ref 5–15)
BUN: 53 mg/dL — ABNORMAL HIGH (ref 6–20)
CO2: 22 mmol/L (ref 22–32)
Calcium: 9.2 mg/dL (ref 8.9–10.3)
Chloride: 107 mmol/L (ref 98–111)
Creatinine, Ser: 2.09 mg/dL — ABNORMAL HIGH (ref 0.61–1.24)
GFR, Estimated: 36 mL/min — ABNORMAL LOW (ref >60)
Glucose, Bld: 101 mg/dL — ABNORMAL HIGH (ref 70–99)
Potassium: 4.5 mmol/L (ref 3.5–5.1)
Sodium: 137 mmol/L (ref 135–145)
Total Bilirubin: 0.9 mg/dL (ref 0.3–1.2)
Total Protein: 7.4 g/dL (ref 6.5–8.1)

## 2020-10-02 LAB — CBC WITH DIFFERENTIAL/PLATELET
Abs Immature Granulocytes: 0.03 10*3/uL (ref 0.00–0.07)
Basophils Absolute: 0.1 10*3/uL (ref 0.0–0.1)
Basophils Relative: 1 %
Eosinophils Absolute: 0.4 10*3/uL (ref 0.0–0.5)
Eosinophils Relative: 4 %
HCT: 43.8 % (ref 39.0–52.0)
Hemoglobin: 14 g/dL (ref 13.0–17.0)
Immature Granulocytes: 0 %
Lymphocytes Relative: 22 %
Lymphs Abs: 2.2 10*3/uL (ref 0.7–4.0)
MCH: 27.5 pg (ref 26.0–34.0)
MCHC: 32 g/dL (ref 30.0–36.0)
MCV: 85.9 fL (ref 80.0–100.0)
Monocytes Absolute: 1 10*3/uL (ref 0.1–1.0)
Monocytes Relative: 10 %
Neutro Abs: 6.2 10*3/uL (ref 1.7–7.7)
Neutrophils Relative %: 63 %
Platelets: 161 10*3/uL (ref 150–400)
RBC: 5.1 MIL/uL (ref 4.22–5.81)
RDW: 14.7 % (ref 11.5–15.5)
WBC: 9.9 10*3/uL (ref 4.0–10.5)
nRBC: 0 % (ref 0.0–0.2)

## 2020-10-02 MED ORDER — SODIUM CHLORIDE 0.9 % IV SOLN
200.0000 mg | Freq: Once | INTRAVENOUS | Status: AC
  Administered 2020-10-02: 200 mg via INTRAVENOUS
  Filled 2020-10-02: qty 8

## 2020-10-02 MED ORDER — SODIUM CHLORIDE 0.9 % IV SOLN
Freq: Once | INTRAVENOUS | Status: AC
  Filled 2020-10-02: qty 250

## 2020-10-02 NOTE — Progress Notes (Signed)
Hematology/Oncology Progress Note [facility] [phone] [phone]  Patient Care Team: Center, [facility] as PCP - General (General Practice)   Name of the patient: Mark Donaldson  [id number]  Jun 23, 1960  Date of visit: 10/02/20   INTERVAL HISTORY-  60 y.o. male presents for follow-up of renal cell carcinoma treatments. Patient has past medical history CAD status post PCI to LAD in 2018, on aspirin and Plavix, CHF, history of focal sclerosing/crescentic GN-ANCA positive and CKD stage III. He was hospitalized from 05/02/2019-05/07/2019 due to gross hematuria. CT showed a 7.4 cm left lower pole renal mass concerning for RCC.  Patient also has necrotic lymphadenopathy in the mediastinum and the right supra hilar region which were highly suspicious for metastatic disease.  Bilateral lung nodules, also concerning for metastatic lung disease.  Right retrocrural lymphadenopathy with upper normal left periaortic lymph node. Patient was recommended for cytoreductive radical nephrectomy.  With history of ANCA positive vasculitis, also recommend thoracic lymphadenopathy biopsy to confirm distal metastasis.  Patient agreed to radical nephrectomy and declined bronchoscopy biopsy. He underwent left radical nephrectomy on 05/05/2019. Pathology showed pT3a pNx, RCC, conventional clear cell type, grade 3 Patient was discharged home. Today he presents to discuss pathology and the management plan. He was accompanied by his wife and daughter. Patient reports that he uses hydrocodone for pain.  Pain around the surgical site has improved.  He occasionally has tingling sensation around the surgical site.  Denies any fever or chills, drainage from the surgical site.  # Interval CT chest abdomen pelvis was independently reviewed by me and discussed with patient. 01/12/2020, CT showed overall marked improvement.  Comparing to CT scan in August 2021,  continues to have treatment response.Mediastinal soft tissue nodule nearly completely resolved, right minor fissure nodule 8 mm, decreased in size.  Resolution of pleural fluid and thickening of the right chest.  Small pleural effusion in the left chest.  No new lung nodules.-Subtle variation of the muscle architecture of the left psoas muscle of unknown significance.-Continue observation.  He is asymptomatic. Subtle nodule in the soft tissue of the left flank.  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 He finished 14 days of albendazole plus praziquantel .   He has had few side effects including poor appetite, hair loss, weight loss, bloody stool, abdominal cramping or diarrhea. All symptoms have improved since he completed the treatment. 03/16/2020 he finished tapering course of steroids  # It has been noted that his glucose level has been elevated in December 2021, even before he was started on prednisone treatments. Dr. Steva Ready has obtained A1c which came back elevated at 8.7.  # 04/30/2020, CT chest abdomen pelvis without contrast showed stable disease.   #04/30/2020, axitinib and immunotherapy were held due to diarrhea and fatigue. #05/15/2020 resumed on immunotherapy.   #Intermittent orthopnea, symptom has resolved.  Continue Lasix Pre-existing diastolic CHF, 5/42/7062 LVEF 60 to 65%.   Recommend him to follow-up with cardiology-he is has not able to follow-up with cardiology due to lack of insurance coverage.  INTERVAL HISTORY Mark Donaldson is a 60 y.o. male who has above history reviewed by me today presents for follow up visit for management of metastatic RCC Problems and complaints are listed below: Patient is Spanish-speaking.   Online Spanish interpreter service was utilized for the entire encounter for translation Restarted Axitinib 5mg  at last visit.  Diarrhea, average 3 loose BM per day. He has not used imodium. Lost 2 pounds .  Review of systems-  Review of Systems  Constitutional:  Negative for appetite change, chills, fatigue, fever and unexpected weight change.  HENT:   Negative for voice change.   Eyes:  Negative for eye problems and icterus.  Respiratory:  Negative for chest tightness and cough.   Cardiovascular:  Negative for chest pain and leg swelling.  Gastrointestinal:  Negative for abdominal distention, abdominal pain and diarrhea.  Endocrine: Negative for hot flashes.  Genitourinary:  Negative for difficulty urinating, dysuria and frequency.   Musculoskeletal:  Negative for arthralgias.  Skin:  Negative for itching and rash.  Neurological:  Negative for light-headedness and numbness.  Hematological:  Negative for adenopathy. Does not bruise/bleed easily.  Psychiatric/Behavioral:  Negative for confusion.    No Known Allergies  Patient Active Problem List   Diagnosis Date Noted   History of CHF (congestive heart failure) 05/01/2020   Hyperglycemia 02/28/2020   Stage 3b chronic kidney disease ([place]) 10/04/2019   Brain lesion 10/04/2019   Encounter for antineoplastic immunotherapy 07/19/2019   Encounter for antineoplastic chemotherapy 07/19/2019   CNS lesion 07/07/2019   Anemia in stage 3b chronic kidney disease ([place]) 06/07/2019   Goals of care, counseling/discussion 05/23/2019   Renal cell carcinoma of left kidney ([place]) 05/23/2019   Renal cell carcinoma ([place]) 05/23/2019   Palliative care encounter    History of vasculitis    Thoracic lymphadenopathy    Lung nodule    CAD S/P percutaneous coronary angioplasty 05/02/2019   Kidney mass 05/02/2019   Hydronephrosis of left kidney 05/02/2019   UTI (urinary tract infection) 05/02/2019   AKI (acute kidney injury) ([place]) 05/02/2019   Gross hematuria 05/02/2019   Chronic diastolic CHF (congestive heart failure) ([place]) 05/02/2019   Sepsis ([place]) 05/02/2019   Severe sepsis ([place]) 05/02/2019   Preop cardiovascular exam    Hyperlipidemia 05/21/2016   Occlusion of left  anterior descending (LAD) artery ([place]) 05/21/2016   Prediabetes 05/21/2016   Alcohol abuse 05/18/2016   Essential hypertension 05/18/2016   Vasculitis, 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[place]) 05/18/2016   Shortness of breath 05/17/2016     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HCC)    CAD (coronary artery disease)    Essential hypertension    HFrEF (heart failure with reduced ejection fraction) (HCC)    Hyperlipidemia LDL goal <70    Ischemic cardiomyopathy    Renal cell carcinoma ([place]) 05/23/2019   Renal disorder      Past Surgical History:  Procedure Laterality Date   CARDIAC SURGERY     cardiac cath with 2 stent placement   LAPAROSCOPIC NEPHRECTOMY, HAND ASSISTED Left 05/05/2019   Procedure: HAND ASSISTED LAPAROSCOPIC NEPHRECTOMY;  Surgeon: Hollice Espy, MD;  Location: [facility] ORS;  Service: Urology;  Laterality: Left;    Social History   Socioeconomic History   Marital status: Married    Spouse name: Not on file   Number of children: Not on file   Years of education: Not on file   Highest education level: Not on file  Occupational History   Not on file  Tobacco Use   Smoking status: Never   Smokeless tobacco: Never  Substance and Sexual Activity   Alcohol use: Yes   Drug use: Not on file   Sexual activity: Not on file  Other Topics Concern   Not on file  Social History Narrative   Not on file   Social Determinants of Health   Financial Resource Strain: Not on file  Food Insecurity: Not on  file  Transportation Needs: Not on file  Physical Activity: Not on file  Stress: Not on file  Social Connections: Not on file  Intimate Partner Violence: Not on file     Family History  Problem Relation Age of Onset   Cancer Mother    Blindness Brother      Current Outpatient Medications:    aspirin 81 MG EC tablet, Take 81 mg by mouth daily. Swallow whole., Disp: , Rfl:    atorvastatin (LIPITOR) 80 MG tablet, Take 80 mg by mouth daily., Disp: , Rfl:     axitinib (INLYTA) 5 MG tablet, Take 1 tablet (5 mg total) by mouth 2 (two) times daily., Disp: 60 tablet, Rfl: 1   clopidogrel (PLAVIX) 75 MG tablet, Take 75 mg by mouth daily., Disp: , Rfl:    furosemide (LASIX) 20 MG tablet, Take 1 tablet (20 mg total) by mouth daily., Disp: 30 tablet, Rfl: 3   KEYTRUDA 100 MG/4ML SOLN, INJECT 200MG  INTRAVENOUSLY EVERY 3 WEEKS, Disp: 8 mL, Rfl: 11   lisinopril (ZESTRIL) 10 MG tablet, Take 10 mg by mouth daily., Disp: , Rfl:    metoprolol succinate (TOPROL-XL) 100 MG 24 hr tablet, Take 1.5 tablets by mouth daily., Disp: , Rfl:    loperamide (IMODIUM) 2 MG capsule, Take 1 capsule (2 mg total) by mouth See admin instructions. Take 2 tablets with onset of diarrhea, then 1 tablet for every 2 hours until diarrhea stops. Maximum 8 tablets per 24 hours. (Patient not taking: No sig reported), Disp: 60 capsule, Rfl: 0   Physical exam:  Vitals:   10/02/20 0817  BP: 115/89  Pulse: 65  Resp: 20  Temp: 97.8 F (36.6 C)  SpO2: 100%  Weight: 152 lb 4.8 oz (69.1 kg)   Physical Exam Constitutional:      General: He is not in acute distress.    Appearance: He is not diaphoretic.  HENT:     Head: Normocephalic and atraumatic.     Nose: Nose normal.     Mouth/Throat:     Pharynx: No oropharyngeal exudate.  Eyes:     General: No scleral icterus.    Pupils: Pupils are equal, round, and reactive to light.  Cardiovascular:     Rate and Rhythm: Normal rate and regular rhythm.     Heart sounds: No murmur heard. Pulmonary:     Effort: Pulmonary effort is normal. No respiratory distress.     Breath sounds: No rales.  Chest:     Chest wall: No tenderness.  Abdominal:     General: There is no distension.     Palpations: Abdomen is soft.     Tenderness: There is no abdominal tenderness.  Musculoskeletal:        General: Normal range of motion.     Cervical back: Normal range of motion and neck supple.  Skin:    General: Skin is warm and dry.     Findings: No  erythema.  Neurological:     Mental Status: He is alert and oriented to person, place, and time.     Cranial Nerves: No cranial nerve deficit.     Motor: No abnormal muscle tone.     Coordination: Coordination normal.  Psychiatric:        Mood and Affect: Affect normal.       CMP Latest Ref Rng & Units 10/02/2020  Glucose 70 - 99 mg/dL 101(H)  BUN 6 - 20 mg/dL 53(H)  Creatinine 0.61 - 1.24 mg/dL 2.09(H)  Sodium 135 - 145 mmol/L 137  Potassium 3.5 - 5.1 mmol/L 4.5  Chloride 98 - 111 mmol/L 107  CO2 22 - 32 mmol/L 22  Calcium 8.9 - 10.3 mg/dL 9.2  Total Protein 6.5 - 8.1 g/dL 7.4  Total Bilirubin 0.3 - 1.2 mg/dL 0.9  Alkaline Phos 38 - 126 U/L 96  AST 15 - 41 U/L 23  ALT 0 - 44 U/L 17   CBC Latest Ref Rng & Units 10/02/2020  WBC 4.0 - 10.5 K/uL 9.9  Hemoglobin 13.0 - 17.0 g/dL 14.0  Hematocrit 39.0 - 52.0 % 43.8  Platelets 150 - 400 K/uL 161    RADIOGRAPHIC STUDIES: I have personally reviewed the radiological images as listed and agreed with the findings in the report. MR BRAIN W WO CONTRAST  Result Date: 08/13/2020 CLINICAL DATA:  Neurocysticercosis B69.0 (ICD-10-CM). Yearly follow-up for brain lesions. EXAM: MRI HEAD WITHOUT AND WITH CONTRAST TECHNIQUE: Multiplanar, multiecho pulse sequences of the brain and surrounding structures were obtained without and with intravenous contrast. CONTRAST:  32mL GADAVIST GADOBUTROL 1 MMOL/ML IV SOLN COMPARISON:  MRI of the brain June 22, 2018 FINDINGS: Brain: No acute infarction, hemorrhage, hydrocephalus, or extra-axial collection. Extra-axial cystic lesions are again seen, consistent with the known diagnosis of neurocysticercosis. The lesions show rim enhancing walls, some of them with mild surrounding vasogenic edema, suggesting granular nodular stage: 1. Left frontal region, series 18, image 116, 6 mm, 5 mm on prior. 2. Interhemispheric fissure, series 18, image 113, 9 mm, new from prior. 3. Right parietal region, series 18, image 110, mild  surrounding vasogenic edema, 15 mm, 20 mm on prior. 4. Right posterior parietal region, series 18, image 99, mild surrounding vasogenic edema, 17 mm, 21 mm on prior. 5. Left basal ganglia region, series 18, image 81, 5 mm, 5 mm on prior. Additionally, multiple foci of susceptibility artifact are seen scattered throughout the bilateral cysts cerebral and cerebellar hemispheres, suggesting nodular calcified lesions, similar to prior MRI. Vascular: Normal flow voids. Skull and upper cervical spine: Normal marrow signal. Sinuses/Orbits: Small mucosal retention cyst in the right maxillary sinus. Mild mucosal thickening of the bilateral ethmoid cells. The orbits are maintained. Other: None. IMPRESSION: 1. Findings consistent with the clinical diagnosis of neurosurgeons neurocysticercosis. 2. Cystic lesions are similar to decreased in size when compared to prior MRI except for 1 interhemispheric fissure lesion which it is new. However, they are more consistent with granular nodular stage. 3. Calcified lesions are similar to prior MRI. Electronically Signed   By: Pedro Earls M.D.   On: 08/13/2020 17:41   CT CHEST ABDOMEN PELVIS WO CONTRAST  Result Date: 08/01/2020 CLINICAL DATA:  History of renal cell carcinoma status post left nephrectomy April 2021. Ongoing therapy. EXAM: CT CHEST, ABDOMEN AND PELVIS WITHOUT CONTRAST TECHNIQUE: Multidetector CT imaging of the chest, abdomen and pelvis was performed following the standard protocol without IV contrast. COMPARISON:  Multiple priors including most recent CT April 18,2022. FINDINGS: CT CHEST FINDINGS Cardiovascular: No thoracic aortic aneurysm. No aortic atherosclerosis. Three-vessel coronary artery calcifications. Normal size heart. No significant pericardial effusion/thickening. Mediastinum/Nodes: No pathologically enlarged mediastinal hilar lymph nodes. No significant change in the paramediastinal soft tissue nodularity for instance a small soft tissue  nodule along the right anterior mediastinal margin measures 4 mm on image 24/2, unchanged. No new or enlarging lesions. No discrete thyroid gland nodule. Trachea and esophagus are grossly unremarkable. Lungs/Pleura: Decreased size of the pulmonary nodule along the minor fissure in the right chest  now measuring 6 mm on image 75/4, previously 8 mm. No new suspicious pulmonary nodules or masses. Mild centrilobular emphysema. Diffuse bronchial thickening with diffuse ground-glass micro nodularity for instance in the anterior upper lobes on image 60/3 and and posterior lower lobes 97/3, favored infectious or inflammatory. No pleural effusion. No pneumothorax. Musculoskeletal: Multilevel degenerative changes of the spine. No aggressive lytic or blastic lesion of bone. CT ABDOMEN PELVIS FINDINGS Hepatobiliary: Unremarkable noncontrast appearance the hepatic parenchyma. Gallbladder is. No ductal dilation. Pancreas:  Within normal limits. Spleen:  Within normal limits. Adrenals/Urinary Tract: Bilateral adrenal glands are unremarkable. Status post left nephrectomy without new suspicious soft tissue nodularity in the nephrectomy bed. No right-sided hydronephrosis or contour deforming renal mass. Urinary bladder is grossly unremarkable for degree of distension. Stomach/Bowel: Stomach is grossly unremarkable for degree of distension. Normal positioning of the duodenum/ligament of [place]. No suspicious small bowel dilation. Appendix and terminal ileum appear normal. Radio opaque enteric contrast traverses the rectum. Left-sided colonic diverticulosis without findings of acute diverticulitis. Vascular/Lymphatic: No significant vascular findings are present. No pathologically enlarged abdominal or pelvic lymph nodes. Reproductive: Mildly enlarged prostate. Other: No abdominopelvic ascites.  No discrete peritoneal nodules. Musculoskeletal: Multilevel degenerative changes spine. 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  No aggressive lytic or blastic lesion of bone. IMPRESSION: 1. Overall stable examination status post LEFT nephrectomy without evidence of local recurrence. No evidence of new or enlarging metastatic disease in the chest, abdomen or pelvis. 2. Slightly decreased size of the now 6 mm pulmonary nodule along the minor fissure in the right chest with stable tiny soft tissue nodularity in the anterior mediastinum corresponding with prior necrotic lymph nodes. 3. Diffuse bronchial thickening and ground-glass micro nodularity, favored infectious or inflammatory. However, while this is not a typical pattern for drug related lung injury, in a patient on immunotherapy it is still a differential consideration. 4. Left-sided colonic diverticulosis without findings of acute diverticulitis. 5. 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 6. Aortic Atherosclerosis (ICD10-I70.0) and Emphysema (ICD10-J43.9). Electronically Signed   By: Dahlia Bailiff MD   On: 08/01/2020 08:33     Assessment and plan-  Patient is a 60 y.o. male history of CAD, status post stent, CHF, history of ANCA positive sclerosing/crescentic GN, chronic kidney disease stage III presents for treatments of stage IV renal cell carcinoma 1. Renal cell carcinoma of left kidney (HCC)   2. Encounter for antineoplastic immunotherapy   3. Encounter for antineoplastic chemotherapy   4. Primary hypertension   5. History of CHF (congestive heart failure)   6. Diarrhea, unspecified type   Cancer Staging Renal cell carcinoma of left kidney ([place]) Staging form: Kidney, AJCC 8th Edition - Pathologic: Stage IV (pT3a, pNX, cM1) - Signed by Earlie Server, MD on 05/23/2019   # Stage IV renal cell carcinoma 07/31/2020, CT chest abdomen pelvis showed stable disease Labs are reviewed and discussed with patient. Proceed with Keytruda. Continue Axitinib 5mg  daily.    # Axitinib induced Diarrhea, discussed about using Imodium.  Instructions were discussed in details.   #Hypertension, CHF,  Continue metoprolol, lisinopril.  Lasix Stable.   #Brain lesion-  neurocysticercosis finished therapy.  Follow-up with ID There is a new lesion, Dr.Ravishanka plans to consult expert and let patient know.  #CKD, avoid nephrotoxins. Encourage oral hydration.   Online Spanish interpreter presented for the entire encounter for translation. We spent sufficient time to discuss many aspect of care, questions were answered to patient's satisfaction. Follow-up in  3 weeks for follow-up of evaluation prior to immunotherapy.  Earlie Server, MD, PhD Hematology Oncology [facility] Pager- [phone] 10/02/2020

## 2020-10-02 NOTE — Patient Instructions (Signed)
[facility] [place] [facility]  Discharge Instructions: Thank you for choosing [facility] to provide your oncology and hematology care.  If you have a lab appointment with the Cancer Center, please go directly to the Cancer Center and check in at the registration area.  Wear comfortable clothing and clothing appropriate for easy access to any Portacath or PICC line.   We strive to give you quality time with your provider. You may need to reschedule your appointment if you arrive late (15 or more minutes).  Arriving late affects you and other patients whose appointments are after yours.  Also, if you miss three or more appointments without notifying the office, you may be dismissed from the clinic at the provider's discretion.      For prescription refill requests, have your pharmacy contact our office and allow 72 hours for refills to be completed.    Today you received the following chemotherapy and/or immunotherapy agents : Keytruda    To help prevent nausea and vomiting after your treatment, we encourage you to take your nausea medication as directed.  BELOW ARE SYMPTOMS THAT SHOULD BE REPORTED IMMEDIATELY: *FEVER GREATER THAN 100.4 F (38 C) OR HIGHER *CHILLS OR SWEATING *NAUSEA AND VOMITING THAT IS NOT CONTROLLED WITH YOUR NAUSEA MEDICATION *UNUSUAL SHORTNESS OF BREATH *UNUSUAL BRUISING OR BLEEDING *URINARY PROBLEMS (pain or burning when urinating, or frequent urination) *BOWEL PROBLEMS (unusual diarrhea, constipation, pain near the anus) TENDERNESS IN MOUTH AND THROAT WITH OR WITHOUT PRESENCE OF ULCERS (sore throat, sores in mouth, or a toothache) UNUSUAL RASH, SWELLING OR PAIN  UNUSUAL VAGINAL DISCHARGE OR ITCHING   Items with * indicate a potential emergency and should be followed up as soon as possible or go to the Emergency Department if any problems should occur.  Please show the CHEMOTHERAPY ALERT CARD or IMMUNOTHERAPY ALERT CARD at check-in  to the Emergency Department and triage nurse.  Should you have questions after your visit or need to cancel or reschedule your appointment, please contact [facility] [place]  [phone] and follow the prompts.  Office hours are 8:00 a.m. to 4:30 p.m. Monday - Friday. Please note that voicemails left after 4:00 p.m. may not be returned until the following business day.  We are closed weekends and major holidays. You have access to a nurse at all times for urgent questions. Please call the main number to the clinic [phone] and follow the prompts.  For any non-urgent questions, you may also contact your provider using MyChart. We now offer e-Visits for anyone 18 and older to request care online for non-urgent symptoms. For details visit [email or web address].   Also download the MyChart app! Go to the app store, search "MyChart", open the app, select [facility], and log in with your MyChart username and password.  Due to Covid, a mask is required upon entering the hospital/clinic. If you do not have a mask, one will be given to you upon arrival. For doctor visits, patients may have 1 support person aged 18 or older with them. For treatment visits, patients cannot have anyone with them due to current Covid guidelines and our immunocompromised population.  

## 2020-10-23 ENCOUNTER — Inpatient Hospital Stay: Payer: Self-pay

## 2020-10-23 ENCOUNTER — Other Ambulatory Visit: Payer: Self-pay

## 2020-10-23 ENCOUNTER — Inpatient Hospital Stay (HOSPITAL_BASED_OUTPATIENT_CLINIC_OR_DEPARTMENT_OTHER): Payer: Self-pay | Admitting: Oncology

## 2020-10-23 ENCOUNTER — Inpatient Hospital Stay: Payer: Self-pay | Attending: Oncology

## 2020-10-23 ENCOUNTER — Encounter: Payer: Self-pay | Admitting: Oncology

## 2020-10-23 VITALS — BP 96/66 | HR 77 | Temp 96.8°F | Wt 155.1 lb

## 2020-10-23 DIAGNOSIS — Z5112 Encounter for antineoplastic immunotherapy: Secondary | ICD-10-CM

## 2020-10-23 DIAGNOSIS — C642 Malignant neoplasm of left kidney, except renal pelvis: Secondary | ICD-10-CM

## 2020-10-23 DIAGNOSIS — G8929 Other chronic pain: Secondary | ICD-10-CM

## 2020-10-23 DIAGNOSIS — Z905 Acquired absence of kidney: Secondary | ICD-10-CM | POA: Insufficient documentation

## 2020-10-23 DIAGNOSIS — I2510 Atherosclerotic heart disease of native coronary artery without angina pectoris: Secondary | ICD-10-CM | POA: Insufficient documentation

## 2020-10-23 DIAGNOSIS — M25511 Pain in right shoulder: Secondary | ICD-10-CM

## 2020-10-23 DIAGNOSIS — Z8679 Personal history of other diseases of the circulatory system: Secondary | ICD-10-CM

## 2020-10-23 DIAGNOSIS — G939 Disorder of brain, unspecified: Secondary | ICD-10-CM

## 2020-10-23 DIAGNOSIS — Z5111 Encounter for antineoplastic chemotherapy: Secondary | ICD-10-CM

## 2020-10-23 DIAGNOSIS — Z955 Presence of coronary angioplasty implant and graft: Secondary | ICD-10-CM | POA: Insufficient documentation

## 2020-10-23 DIAGNOSIS — I130 Hypertensive heart and chronic kidney disease with heart failure and stage 1 through stage 4 chronic kidney disease, or unspecified chronic kidney disease: Secondary | ICD-10-CM | POA: Insufficient documentation

## 2020-10-23 DIAGNOSIS — N1832 Chronic kidney disease, stage 3b: Secondary | ICD-10-CM | POA: Insufficient documentation

## 2020-10-23 LAB — CBC WITH DIFFERENTIAL/PLATELET
Abs Immature Granulocytes: 0.03 10*3/uL (ref 0.00–0.07)
Basophils Absolute: 0 10*3/uL (ref 0.0–0.1)
Basophils Relative: 0 %
Eosinophils Absolute: 0.2 10*3/uL (ref 0.0–0.5)
Eosinophils Relative: 2 %
HCT: 43.4 % (ref 39.0–52.0)
Hemoglobin: 13.7 g/dL (ref 13.0–17.0)
Immature Granulocytes: 0 %
Lymphocytes Relative: 16 %
Lymphs Abs: 1.6 10*3/uL (ref 0.7–4.0)
MCH: 27 pg (ref 26.0–34.0)
MCHC: 31.6 g/dL (ref 30.0–36.0)
MCV: 85.6 fL (ref 80.0–100.0)
Monocytes Absolute: 0.9 10*3/uL (ref 0.1–1.0)
Monocytes Relative: 9 %
Neutro Abs: 7.6 10*3/uL (ref 1.7–7.7)
Neutrophils Relative %: 73 %
Platelets: 191 10*3/uL (ref 150–400)
RBC: 5.07 MIL/uL (ref 4.22–5.81)
RDW: 15.2 % (ref 11.5–15.5)
WBC: 10.4 10*3/uL (ref 4.0–10.5)
nRBC: 0 % (ref 0.0–0.2)

## 2020-10-23 LAB — COMPREHENSIVE METABOLIC PANEL
ALT: 15 U/L (ref 0–44)
AST: 22 U/L (ref 15–41)
Albumin: 3.7 g/dL (ref 3.5–5.0)
Alkaline Phosphatase: 86 U/L (ref 38–126)
Anion gap: 7 (ref 5–15)
BUN: 44 mg/dL — ABNORMAL HIGH (ref 6–20)
CO2: 22 mmol/L (ref 22–32)
Calcium: 8.5 mg/dL — ABNORMAL LOW (ref 8.9–10.3)
Chloride: 102 mmol/L (ref 98–111)
Creatinine, Ser: 2.47 mg/dL — ABNORMAL HIGH (ref 0.61–1.24)
GFR, Estimated: 29 mL/min — ABNORMAL LOW (ref >60)
Glucose, Bld: 106 mg/dL — ABNORMAL HIGH (ref 70–99)
Potassium: 4.2 mmol/L (ref 3.5–5.1)
Sodium: 131 mmol/L — ABNORMAL LOW (ref 135–145)
Total Bilirubin: 0.7 mg/dL (ref 0.3–1.2)
Total Protein: 6.4 g/dL — ABNORMAL LOW (ref 6.5–8.1)

## 2020-10-23 MED ORDER — SODIUM CHLORIDE 0.9 % IV SOLN
200.0000 mg | Freq: Once | INTRAVENOUS | Status: AC
  Administered 2020-10-23: 200 mg via INTRAVENOUS
  Filled 2020-10-23: qty 8

## 2020-10-23 MED ORDER — SODIUM CHLORIDE 0.9 % IV SOLN
Freq: Once | INTRAVENOUS | Status: AC
  Filled 2020-10-23: qty 250

## 2020-10-23 MED ORDER — LOPERAMIDE HCL 2 MG PO CAPS
2.0000 mg | ORAL_CAPSULE | ORAL | 2 refills | Status: DC
Start: 2020-10-23 — End: 2021-11-21

## 2020-10-23 NOTE — Progress Notes (Signed)
Hematology/Oncology Progress Note [facility] Telephone:([phone] [phone]  Patient Care Team: Center, [facility] as PCP - General (General Practice)   Name of the patient: Mark Donaldson  [id number]  06/28/1960  Date of visit: 10/23/20   INTERVAL HISTORY-  60 y.o. male presents for follow-up of renal cell carcinoma treatments. Patient has past medical history CAD status post PCI to LAD in 2018, on aspirin and Plavix, CHF, history of focal sclerosing/crescentic GN-ANCA positive and CKD stage III. He was hospitalized from 05/02/2019-05/07/2019 due to gross hematuria. CT showed a 7.4 cm left lower pole renal mass concerning for RCC.  Patient also has necrotic lymphadenopathy in the mediastinum and the right supra hilar region which were highly suspicious for metastatic disease.  Bilateral lung nodules, also concerning for metastatic lung disease.  Right retrocrural lymphadenopathy with upper normal left periaortic lymph node. Patient was recommended for cytoreductive radical nephrectomy.  With history of ANCA positive vasculitis, also recommend thoracic lymphadenopathy biopsy to confirm distal metastasis.  Patient agreed to radical nephrectomy and declined bronchoscopy biopsy. He underwent left radical nephrectomy on 05/05/2019. Pathology showed pT3a pNx, RCC, conventional clear cell type, grade 3 Patient was discharged home. Today he presents to discuss pathology and the management plan. He was accompanied by his wife and daughter. Patient reports that he uses hydrocodone for pain.  Pain around the surgical site has improved.  He occasionally has tingling sensation around the surgical site.  Denies any fever or chills, drainage from the surgical site.  # Interval CT chest abdomen pelvis was independently reviewed by me and discussed with patient. 01/12/2020, CT showed overall marked improvement.  Comparing to CT scan in August 2021,  continues to have treatment response.Mediastinal soft tissue nodule nearly completely resolved, right minor fissure nodule 8 mm, decreased in size.  Resolution of pleural fluid and thickening of the right chest.  Small pleural effusion in the left chest.  No new lung nodules.-Subtle variation of the muscle architecture of the left psoas muscle of unknown significance.-Continue observation.  He is asymptomatic. Subtle nodule in the soft tissue of the left flank.  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 He finished 14 days of albendazole plus praziquantel .   He has had few side effects including poor appetite, hair loss, weight loss, bloody stool, abdominal cramping or diarrhea. All symptoms have improved since he completed the treatment. 03/16/2020 he finished tapering course of steroids  # It has been noted that his glucose level has been elevated in December 2021, even before he was started on prednisone treatments. Dr. Steva Ready has obtained A1c which came back elevated at 8.7.  # 04/30/2020, CT chest abdomen pelvis without contrast showed stable disease.   #04/30/2020, axitinib and immunotherapy were held due to diarrhea and fatigue. #05/15/2020 resumed on immunotherapy.   #Intermittent orthopnea, symptom has resolved.  Continue Lasix Pre-existing diastolic CHF, 1/74/9449 LVEF 60 to 65%.   Recommend him to follow-up with cardiology-he is has not able to follow-up with cardiology due to lack of insurance coverage.  INTERVAL HISTORY Mark Donaldson is a 60 y.o. male who has above history reviewed by me today presents for follow up visit for management of metastatic RCC Problems and complaints are listed below: Patient is Spanish-speaking.   Online Spanish interpreter service was utilized for the entire encounter for translation Restarted Axitinib 5mg  at last visit.  Diarrhea, average 3-4 loose BM per day on some days.     Review of  systems- Review of Systems   Constitutional:  Negative for appetite change, chills, fatigue, fever and unexpected weight change.  HENT:   Negative for voice change.   Eyes:  Negative for eye problems and icterus.  Respiratory:  Negative for chest tightness and cough.   Cardiovascular:  Negative for chest pain and leg swelling.  Gastrointestinal:  Positive for diarrhea. Negative for abdominal distention and abdominal pain.  Endocrine: Negative for hot flashes.  Genitourinary:  Negative for difficulty urinating, dysuria and frequency.   Musculoskeletal:  Negative for arthralgias.  Skin:  Negative for itching and rash.  Neurological:  Negative for light-headedness and numbness.  Hematological:  Negative for adenopathy. Does not bruise/bleed easily.  Psychiatric/Behavioral:  Negative for confusion.    No Known Allergies  Patient Active Problem List   Diagnosis Date Noted   History of CHF (congestive heart failure) 05/01/2020   Hyperglycemia 02/28/2020   Stage 3b chronic kidney disease ([place]) 10/04/2019   Brain lesion 10/04/2019   Encounter for antineoplastic immunotherapy 07/19/2019   Encounter for antineoplastic chemotherapy 07/19/2019   CNS lesion 07/07/2019   Anemia in stage 3b chronic kidney disease ([place]) 06/07/2019   Goals of care, counseling/discussion 05/23/2019   Renal cell carcinoma of left kidney ([place]) 05/23/2019   Renal cell carcinoma ([place]) 05/23/2019   Palliative care encounter    History of vasculitis    Thoracic lymphadenopathy    Lung nodule    CAD S/P percutaneous coronary angioplasty 05/02/2019   Kidney mass 05/02/2019   Hydronephrosis of left kidney 05/02/2019   UTI (urinary tract infection) 05/02/2019   AKI (acute kidney injury) ([place]) 05/02/2019   Gross hematuria 05/02/2019   Chronic diastolic CHF (congestive heart failure) ([place]) 05/02/2019   Sepsis ([place]) 05/02/2019   Severe sepsis ([place]) 05/02/2019   Preop cardiovascular exam    Hyperlipidemia 05/21/2016   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 ([place]) 05/21/2016   Prediabetes 05/21/2016   Alcohol abuse 05/18/2016   Essential hypertension 05/18/2016   Vasculitis, ANCA positive 05/18/2016   Shortness of breath 05/17/2016     Past Medical History:  Diagnosis Date   ANCA-positive vasculitis    CAD (coronary artery disease)    Essential hypertension    HFrEF (heart failure with reduced ejection fraction) (HCC)    Hyperlipidemia LDL goal <70    Ischemic cardiomyopathy    Renal cell carcinoma ([place]) 05/23/2019   Renal disorder      Past Surgical History:  Procedure Laterality Date   CARDIAC SURGERY     cardiac cath with 2 stent placement   LAPAROSCOPIC NEPHRECTOMY, HAND ASSISTED Left 05/05/2019   Procedure: HAND ASSISTED LAPAROSCOPIC NEPHRECTOMY;  Surgeon: Hollice Espy, MD;  Location: [facility] ORS;  Service: Urology;  Laterality: Left;    Social History   Socioeconomic History   Marital status: Married    Spouse name: Not on file   Number of children: Not on file   Years of education: Not on file   Highest education level: Not on file  Occupational History   Not on file  Tobacco Use   Smoking status: Never   Smokeless tobacco: Never  Substance and Sexual Activity   Alcohol use: Yes   Drug use: Not on file   Sexual activity: Not on file  Other Topics Concern   Not on file  Social History Narrative   Not on file   Social Determinants of Health   Financial Resource Strain: Not on file  Food Insecurity: Not on file  Transportation Needs: Not on file  Physical Activity: Not on file  Stress: Not on file  Social Connections: Not on file  Intimate Partner Violence: Not on file     Family History  Problem Relation Age of Onset   Cancer Mother    Blindness Brother      Current Outpatient Medications:    aspirin 81 MG EC tablet, Take 81 mg by mouth daily. Swallow whole., Disp: , Rfl:    atorvastatin (LIPITOR) 80 MG tablet, Take 80 mg by mouth daily., Disp: , Rfl:    axitinib (INLYTA)  5 MG tablet, Take 1 tablet (5 mg total) by mouth 2 (two) times daily., Disp: 60 tablet, Rfl: 1   clopidogrel (PLAVIX) 75 MG tablet, Take 75 mg by mouth daily., Disp: , Rfl:    furosemide (LASIX) 20 MG tablet, Take 1 tablet (20 mg total) by mouth daily., Disp: 30 tablet, Rfl: 3   KEYTRUDA 100 MG/4ML SOLN, INJECT 200MG  INTRAVENOUSLY EVERY 3 WEEKS, Disp: 8 mL, Rfl: 11   lisinopril (ZESTRIL) 10 MG tablet, Take 10 mg by mouth daily., Disp: , Rfl:    loperamide (IMODIUM) 2 MG capsule, Take 1 capsule (2 mg total) by mouth See admin instructions. Take 2 tablets with onset of diarrhea, then 1 tablet for every 2 hours until diarrhea stops. Maximum 8 tablets per 24 hours., Disp: 60 capsule, Rfl: 2   metoprolol succinate (TOPROL-XL) 100 MG 24 hr tablet, Take 1.5 tablets by mouth daily., Disp: , Rfl:    Physical exam:  Vitals:   10/23/20 0924  BP: 96/66  Pulse: 77  Temp: (!) 96.8 F (36 C)  Weight: 155 lb 1.6 oz (70.4 kg)   Physical Exam Constitutional:      General: He is not in acute distress.    Appearance: He is not diaphoretic.  HENT:     Head: Normocephalic and atraumatic.     Nose: Nose normal.     Mouth/Throat:     Pharynx: No oropharyngeal exudate.  Eyes:     General: No scleral icterus.    Pupils: Pupils are equal, round, and reactive to light.  Cardiovascular:     Rate and Rhythm: Normal rate and regular rhythm.     Heart sounds: No murmur heard. Pulmonary:     Effort: Pulmonary effort is normal. No respiratory distress.     Breath sounds: No rales.  Chest:     Chest wall: No tenderness.  Abdominal:     General: There is no distension.     Palpations: Abdomen is soft.     Tenderness: There is no abdominal tenderness.  Musculoskeletal:        General: Normal range of motion.     Cervical back: Normal range of motion and neck supple.  Skin:    General: Skin is warm and dry.     Findings: No erythema.  Neurological:     Mental Status: He is alert and oriented to person,  place, and time.     Cranial Nerves: No cranial nerve deficit.     Motor: No abnormal muscle tone.     Coordination: Coordination normal.  Psychiatric:        Mood and Affect: Affect normal.       CMP Latest Ref Rng & Units 10/23/2020  Glucose 70 - 99 mg/dL 106(H)  BUN 6 - 20 mg/dL 44(H)  Creatinine 0.61 - 1.24 mg/dL 2.47(H)  Sodium 135 - 145 mmol/L 131(L)  Potassium 3.5 - 5.1 mmol/L  4.2  Chloride 98 - 111 mmol/L 102  CO2 22 - 32 mmol/L 22  Calcium 8.9 - 10.3 mg/dL 8.5(L)  Total Protein 6.5 - 8.1 g/dL 6.4(L)  Total Bilirubin 0.3 - 1.2 mg/dL 0.7  Alkaline Phos 38 - 126 U/L 86  AST 15 - 41 U/L 22  ALT 0 - 44 U/L 15   CBC Latest Ref Rng & Units 10/23/2020  WBC 4.0 - 10.5 K/uL 10.4  Hemoglobin 13.0 - 17.0 g/dL 13.7  Hematocrit 39.0 - 52.0 % 43.4  Platelets 150 - 400 K/uL 191    RADIOGRAPHIC STUDIES: I have personally reviewed the radiological images as listed and agreed with the findings in the report. MR BRAIN W WO CONTRAST  Result Date: 08/13/2020 CLINICAL DATA:  Neurocysticercosis B69.0 (ICD-10-CM). Yearly follow-up for brain lesions. EXAM: MRI HEAD WITHOUT AND WITH CONTRAST TECHNIQUE: Multiplanar, multiecho pulse sequences of the brain and surrounding structures were obtained without and with intravenous contrast. CONTRAST:  39mL GADAVIST GADOBUTROL 1 MMOL/ML IV SOLN COMPARISON:  MRI of the brain June 22, 2018 FINDINGS: Brain: No acute infarction, hemorrhage, hydrocephalus, or extra-axial collection. Extra-axial cystic lesions are again seen, consistent with the known diagnosis of neurocysticercosis. The lesions show rim enhancing walls, some of them with mild surrounding vasogenic edema, suggesting granular nodular stage: 1. Left frontal region, series 18, image 116, 6 mm, 5 mm on prior. 2. Interhemispheric fissure, series 18, image 113, 9 mm, new from prior. 3. Right parietal region, series 18, image 110, mild surrounding vasogenic edema, 15 mm, 20 mm on prior. 4. Right posterior  parietal region, series 18, image 99, mild surrounding vasogenic edema, 17 mm, 21 mm on prior. 5. Left basal ganglia region, series 18, image 81, 5 mm, 5 mm on prior. Additionally, multiple foci of susceptibility artifact are seen scattered throughout the bilateral cysts cerebral and cerebellar hemispheres, suggesting nodular calcified lesions, similar to prior MRI. Vascular: Normal flow voids. Skull and upper cervical spine: Normal marrow signal. Sinuses/Orbits: Small mucosal retention cyst in the right maxillary sinus. Mild mucosal thickening of the bilateral ethmoid cells. The orbits are maintained. Other: None. IMPRESSION: 1. Findings consistent with the clinical diagnosis of neurosurgeons neurocysticercosis. 2. Cystic lesions are similar to decreased in size when compared to prior MRI except for 1 interhemispheric fissure lesion which it is new. However, they are more consistent with granular nodular stage. 3. Calcified lesions are similar to prior MRI. Electronically Signed   By: Pedro Earls M.D.   On: 08/13/2020 17:41   CT CHEST ABDOMEN PELVIS WO CONTRAST  Result Date: 08/01/2020 CLINICAL DATA:  History of renal cell carcinoma status post left nephrectomy April 2021. Ongoing therapy. EXAM: CT CHEST, ABDOMEN AND PELVIS WITHOUT CONTRAST TECHNIQUE: Multidetector CT imaging of the chest, abdomen and pelvis was performed following the standard protocol without IV contrast. COMPARISON:  Multiple priors including most recent CT April 18,2022. FINDINGS: CT CHEST FINDINGS Cardiovascular: No thoracic aortic aneurysm. No aortic atherosclerosis. Three-vessel coronary artery calcifications. Normal size heart. No significant pericardial effusion/thickening. Mediastinum/Nodes: No pathologically enlarged mediastinal hilar lymph nodes. No significant change in the paramediastinal soft tissue nodularity for instance a small soft tissue nodule along the right anterior mediastinal margin measures 4 mm on  image 24/2, unchanged. No new or enlarging lesions. No discrete thyroid gland nodule. Trachea and esophagus are grossly unremarkable. Lungs/Pleura: Decreased size of the pulmonary nodule along the minor fissure in the right chest now measuring 6 mm on image 75/4, previously 8 mm. No new  suspicious pulmonary nodules or masses. Mild centrilobular emphysema. Diffuse bronchial thickening with diffuse ground-glass micro nodularity for instance in the anterior upper lobes on image 60/3 and and posterior lower lobes 97/3, favored infectious or inflammatory. No pleural effusion. No pneumothorax. Musculoskeletal: Multilevel degenerative changes of the spine. No aggressive lytic or blastic lesion of bone. CT ABDOMEN PELVIS FINDINGS Hepatobiliary: Unremarkable noncontrast appearance the hepatic parenchyma. Gallbladder is. No ductal dilation. Pancreas:  Within normal limits. Spleen:  Within normal limits. Adrenals/Urinary Tract: Bilateral adrenal glands are unremarkable. Status post left nephrectomy without new suspicious soft tissue nodularity in the nephrectomy bed. No right-sided hydronephrosis or contour deforming renal mass. Urinary bladder is grossly unremarkable for degree of distension. Stomach/Bowel: Stomach is grossly unremarkable for degree of distension. Normal positioning of the duodenum/ligament of [place]. No suspicious small bowel dilation. Appendix and terminal ileum appear normal. Radio opaque enteric contrast traverses the rectum. Left-sided colonic diverticulosis without findings of acute diverticulitis. Vascular/Lymphatic: No significant vascular findings are present. No pathologically enlarged abdominal or pelvic lymph nodes. Reproductive: Mildly enlarged prostate. Other: No abdominopelvic ascites.  No discrete peritoneal nodules. Musculoskeletal: Multilevel degenerative changes spine. 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 No aggressive lytic or blastic lesion of bone. IMPRESSION: 1.  Overall stable examination status post LEFT nephrectomy without evidence of local recurrence. No evidence of new or enlarging metastatic disease in the chest, abdomen or pelvis. 2. Slightly decreased size of the now 6 mm pulmonary nodule along the minor fissure in the right chest with stable tiny soft tissue nodularity in the anterior mediastinum corresponding with prior necrotic lymph nodes. 3. Diffuse bronchial thickening and ground-glass micro nodularity, favored infectious or inflammatory. However, while this is not a typical pattern for drug related lung injury, in a patient on immunotherapy it is still a differential consideration. 4. Left-sided colonic diverticulosis without findings of acute diverticulitis. 5. 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 6. Aortic Atherosclerosis (ICD10-I70.0) and Emphysema (ICD10-J43.9). Electronically Signed   By: Dahlia Bailiff MD   On: 08/01/2020 08:33     Assessment and plan-  Patient is a 60 y.o. male history of CAD, status post stent, CHF, history of ANCA positive sclerosing/crescentic GN, chronic kidney disease stage III presents for treatments of stage IV renal cell carcinoma 1. Renal cell carcinoma of left kidney (HCC)   2. Encounter for antineoplastic immunotherapy   3. Encounter for antineoplastic chemotherapy   4. History of CHF (congestive heart failure)   5. Brain lesion   6. Chronic right shoulder pain   Cancer Staging Renal cell carcinoma of left kidney (HCC) Staging form: Kidney, AJCC 8th Edition - Pathologic: Stage IV (pT3a, pNX, cM1) - Signed by Earlie Server, MD on 05/23/2019   # Stage IV renal cell carcinoma 07/31/2020, CT chest abdomen pelvis showed stable disease Labs are reviewed and discussed with patient. Proceed with Keytruda I will repeat CT scan prior to next visit   # Axitinib induced Diarrhea, discussed about using Imodium. Instructions were discussed in details. Hold axitinib. He has  worsening of kidney function, which could be due to diarrhea.  Encourage oral hydration.   #Hypertension, CHF,  Continue metoprolol, lisinopril.  Lasix Stable.   #Brain lesion-  neurocysticercosis finished therapy.  Follow-up with ID There is a new lesion, Dr.Ravishanka plans to consult expert and let patient know.  #CKD, avoid nephrotoxins. Encourage oral hydration.  # right shoulder pain, check x ray. Orthopedic referral was discussed. He would like to see his pcp  for evaluation.   Online Spanish interpreter presented for the entire encounter for translation. We spent sufficient time to discuss many aspect of care, questions were answered to patient's satisfaction. Follow-up in 3 weeks for follow-up of evaluation prior to immunotherapy.  Earlie Server, MD, PhD Hematology Oncology [facility] Pager- [phone] 10/23/2020

## 2020-10-23 NOTE — Patient Instructions (Signed)
[facility]  Discharge Instructions: Thank you for choosing [facility] to provide your oncology and hematology care.  If you have a lab appointment with the [facility], please go directly to the [facility] and check in at the registration area.  Wear comfortable clothing and clothing appropriate for easy access to any Portacath or PICC line.   We strive to give you quality time with your provider. You may need to reschedule your appointment if you arrive late (15 or more minutes).  Arriving late affects you and other patients whose appointments are after yours.  Also, if you miss three or more appointments without notifying the office, you may be dismissed from the clinic at the provider's discretion.      For prescription refill requests, have your pharmacy contact our office and allow 72 hours for refills to be completed.    Today you received the following chemotherapy and/or immunotherapy agents KEYTRUDA       To help prevent nausea and vomiting after your treatment, we encourage you to take your nausea medication as directed.  BELOW ARE SYMPTOMS THAT SHOULD BE REPORTED IMMEDIATELY: *FEVER GREATER THAN 100.4 F (38 C) OR HIGHER *CHILLS OR SWEATING *NAUSEA AND VOMITING THAT IS NOT CONTROLLED WITH YOUR NAUSEA MEDICATION *UNUSUAL SHORTNESS OF BREATH *UNUSUAL BRUISING OR BLEEDING *URINARY PROBLEMS (pain or burning when urinating, or frequent urination) *BOWEL PROBLEMS (unusual diarrhea, constipation, pain near the anus) TENDERNESS IN MOUTH AND THROAT WITH OR WITHOUT PRESENCE OF ULCERS (sore throat, sores in mouth, or a toothache) UNUSUAL RASH, SWELLING OR PAIN  UNUSUAL VAGINAL DISCHARGE OR ITCHING   Items with * indicate a potential emergency and should be followed up as soon as possible or go to the Emergency Department if any problems should occur.  Please show the CHEMOTHERAPY ALERT CARD or IMMUNOTHERAPY ALERT CARD at check-in  to the Emergency Department and triage nurse.  Should you have questions after your visit or need to cancel or reschedule your appointment, please contact [facility]  [phone] and follow the prompts.  Office hours are 8:00 a.m. to 4:30 p.m. Monday - Friday. Please note that voicemails left after 4:00 p.m. may not be returned until the following business day.  We are closed weekends and major holidays. You have access to a nurse at all times for urgent questions. Please call the main number to the clinic [phone] and follow the prompts.  For any non-urgent questions, you may also contact your provider using MyChart. We now offer e-Visits for anyone 51 and older to request care online for non-urgent symptoms. For details visit [email or web address].   Also download the MyChart app! Go to the app store, search "MyChart", open the app, select [facility], and log in with your MyChart username and password.  Due to Covid, a mask is required upon entering the hospital/clinic. If you do not have a mask, one will be given to you upon arrival. For doctor visits, patients may have 1 support person aged 75 or older with them. For treatment visits, patients cannot have anyone with them due to current Covid guidelines and our immunocompromised population.   Pembrolizumab injection Qu es este medicamento? El PEMBROLIZUMAB es un anticuerpo monoclonal. [place] para tratar ciertos tipos de cncer. Este medicamento puede ser utilizado para otros usos; si tiene alguna pregunta consulte con su proveedor de atencin mdica o con su farmacutico. MARCAS COMUNES: Keytruda Qu le debo informar a mi profesional de  la salud antes de tomar este medicamento? Necesitan saber si usted presenta alguno de los siguientes problemas o situaciones: enfermedades autoinmunes tales como enfermedad de Crohn, colitis ulcerativa o lupus ha tenido o planea tener un trasplante alognico de  Social research officer, government ([place] las clulas madre de Theatre manager persona) antecedentes de trasplante de rganos antecedentes de radiacin en el pecho problemas del sistema nervioso, tales como miastenia grave o sndrome de Curator una reaccin alrgica o inusual al pembrolizumab, a otros medicamentos, alimentos, colorantes o conservantes si est embarazada o buscando quedar embarazada si est amamantando a un beb Cmo debo utilizar este medicamento? Este medicamento se administra mediante infusin en una vena. Lo administra un profesional de Technical sales engineer en un hospital o en un entorno clnico. Se le entregar una Gua del medicamento (MedGuide, su nombre en ingls) especial antes de cada tratamiento. Asegrese de leer esta informacin cada vez cuidadosamente. Hable con su pediatra para informarse acerca del uso de este medicamento en nios. Aunque este medicamento se puede recetar a nios tan pequeos como de 6 meses de edad con ciertas afecciones, existen precauciones que deben tomarse. Sobredosis: Pngase en contacto inmediatamente con un centro toxicolgico o una sala de urgencia si usted cree que haya tomado demasiado medicamento. ATENCIN: [employer] es solo para usted. No comparta este medicamento con nadie. Qu sucede si me olvido de una dosis? Es importante no olvidar ninguna dosis. Informe a su mdico o a su profesional de la salud si no puede asistir a Photographer. Qu puede interactuar con este medicamento? No se han estudiado las interacciones. Puede ser que esta lista no menciona todas las posibles interacciones. Informe a su profesional de [employer]	[place] de [employer] productos a base de hierbas, medicamentos de [place] o suplementos nutritivos que est tomando. Si usted fuma, consume bebidas alcohlicas o si utiliza drogas ilegales, indqueselo tambin a su profesional de [employer]	[place]. Algunas sustancias pueden interactuar con su medicamento. A qu debo estar atento al usar [employer]? Se  supervisar su estado de salud atentamente mientras reciba este medicamento. Usted podra necesitar realizarse [employer] de sangre mientras est usando [place]. No debe quedar embarazada mientras est usando este medicamento o por 4 meses despus de dejar de usarlo. Las mujeres deben informar a su mdico si estn buscando quedar embarazadas o si creen que podran estar embarazadas. Existe la posibilidad de efectos secundarios graves en un beb sin nacer. Para obtener ms informacin, hable con su profesional de la salud o su farmacutico. No debe amamantar a un beb mientras est usando este medicamento o durante 4 meses despus de la ltima dosis. Qu efectos secundarios puedo tener al [employer] este medicamento? Efectos secundarios que debe informar a su mdico o a Barrister's clerk de la salud tan pronto como sea posible: Chief of Staff, tales como erupcin cutnea, comezn/picazn o urticaria, e hinchazn de la cara, los labios o la lengua con sangre o de color negro y aspecto alquitranado problemas para respirar cambios en la visin dolor en el pecho escalofros confusin estreimiento tos diarrea mareo, sensacin de desmayo o aturdimiento frecuencia cardiaca rpida o irregular fiebre enrojecimiento dolor en las articulaciones recuentos sanguneos bajos: este medicamento podra reducir la cantidad de glbulos blancos, glbulos rojos y plaquetas. Su riesgo de infeccin y sangrado podra ser mayor. dolor muscular debilidad muscular dolor, hormigueo o entumecimiento de las manos o los pies dolor de cabeza persistente enrojecimiento, formacin de ampollas, descamacin o distensin de la piel, incluso dentro de la boca signos y sntomas de  niveles elevados de azcar en la sangre, tales como mareo; boca seca; piel seca; aliento frutal; nuseas; dolor de estmago; aumento del apetito o la sed; aumento de la frecuencia urinaria signos y sntomas de lesin renal, tales como dificultad para Garment/textile technologist o  cambios en la cantidad de orina signos y sntomas de lesin en el hgado, como orina oscura, heces claras, prdida del apetito, nuseas, dolor en la regin abdominal superior derecha, color amarillento de los ojos o la piel sudoracin ganglios linfticos inflamados prdida de peso Efectos secundarios que generalmente no requieren atencin mdica (infrmelos a su mdico o a su profesional de la salud si persisten o si son molestos): disminucin del apetito cada del cabello cansancio Puede ser que esta lista no menciona todos los posibles efectos secundarios. Comunquese a su mdico por asesoramiento mdico [employer] Usted puede informar los efectos secundarios a la FDA por telfono al 1-800-FDA-1088. Dnde debo guardar mi medicina? Este medicamento se administra en hospitales o clnicas, y no necesitar guardarlo en su domicilio. ATENCIN: Este folleto es un resumen. Puede ser que no cubra toda la posible informacin. Si usted tiene preguntas acerca de esta medicina, consulte con su mdico, su farmacutico o su profesional de Technical sales engineer.  2022 Elsevier/Gold Standard (2019-05-05 00:00:00)

## 2020-10-24 ENCOUNTER — Ambulatory Visit
Admission: RE | Admit: 2020-10-24 | Discharge: 2020-10-24 | Disposition: A | Discharge status: Home / Self Care | Payer: Self-pay | Attending: Oncology | Admitting: Oncology

## 2020-10-24 ENCOUNTER — Ambulatory Visit
Admission: RE | Admit: 2020-10-24 | Discharge: 2020-10-24 | Disposition: A | Discharge status: Home / Self Care | Payer: Self-pay | Source: Ambulatory Visit | Attending: Oncology | Admitting: Oncology

## 2020-10-24 DIAGNOSIS — M25511 Pain in right shoulder: Secondary | ICD-10-CM

## 2020-11-13 ENCOUNTER — Inpatient Hospital Stay: Payer: Self-pay

## 2020-11-13 ENCOUNTER — Other Ambulatory Visit: Payer: Self-pay

## 2020-11-13 ENCOUNTER — Inpatient Hospital Stay: Payer: Self-pay | Attending: Oncology

## 2020-11-13 ENCOUNTER — Inpatient Hospital Stay (HOSPITAL_BASED_OUTPATIENT_CLINIC_OR_DEPARTMENT_OTHER): Payer: Self-pay | Admitting: Oncology

## 2020-11-13 ENCOUNTER — Encounter: Payer: Self-pay | Admitting: Oncology

## 2020-11-13 VITALS — BP 122/97 | HR 67 | Temp 97.2°F | Resp 18 | Wt 156.3 lb

## 2020-11-13 DIAGNOSIS — G893 Neoplasm related pain (acute) (chronic): Secondary | ICD-10-CM | POA: Insufficient documentation

## 2020-11-13 DIAGNOSIS — G8929 Other chronic pain: Secondary | ICD-10-CM

## 2020-11-13 DIAGNOSIS — I2510 Atherosclerotic heart disease of native coronary artery without angina pectoris: Secondary | ICD-10-CM | POA: Insufficient documentation

## 2020-11-13 DIAGNOSIS — Z8679 Personal history of other diseases of the circulatory system: Secondary | ICD-10-CM

## 2020-11-13 DIAGNOSIS — Z5112 Encounter for antineoplastic immunotherapy: Secondary | ICD-10-CM

## 2020-11-13 DIAGNOSIS — Z9861 Coronary angioplasty status: Secondary | ICD-10-CM | POA: Insufficient documentation

## 2020-11-13 DIAGNOSIS — I7782 Antineutrophilic cytoplasmic antibody (ANCA) vasculitis: Secondary | ICD-10-CM | POA: Insufficient documentation

## 2020-11-13 DIAGNOSIS — B690 Cysticercosis of central nervous system: Secondary | ICD-10-CM

## 2020-11-13 DIAGNOSIS — Z7901 Long term (current) use of anticoagulants: Secondary | ICD-10-CM | POA: Insufficient documentation

## 2020-11-13 DIAGNOSIS — Z7982 Long term (current) use of aspirin: Secondary | ICD-10-CM | POA: Insufficient documentation

## 2020-11-13 DIAGNOSIS — I130 Hypertensive heart and chronic kidney disease with heart failure and stage 1 through stage 4 chronic kidney disease, or unspecified chronic kidney disease: Secondary | ICD-10-CM | POA: Insufficient documentation

## 2020-11-13 DIAGNOSIS — M25511 Pain in right shoulder: Secondary | ICD-10-CM | POA: Insufficient documentation

## 2020-11-13 DIAGNOSIS — Z905 Acquired absence of kidney: Secondary | ICD-10-CM | POA: Insufficient documentation

## 2020-11-13 DIAGNOSIS — Z79899 Other long term (current) drug therapy: Secondary | ICD-10-CM | POA: Insufficient documentation

## 2020-11-13 DIAGNOSIS — C642 Malignant neoplasm of left kidney, except renal pelvis: Secondary | ICD-10-CM

## 2020-11-13 DIAGNOSIS — R7989 Other specified abnormal findings of blood chemistry: Secondary | ICD-10-CM | POA: Insufficient documentation

## 2020-11-13 DIAGNOSIS — N1832 Chronic kidney disease, stage 3b: Secondary | ICD-10-CM | POA: Insufficient documentation

## 2020-11-13 DIAGNOSIS — I5042 Chronic combined systolic (congestive) and diastolic (congestive) heart failure: Secondary | ICD-10-CM | POA: Insufficient documentation

## 2020-11-13 DIAGNOSIS — R739 Hyperglycemia, unspecified: Secondary | ICD-10-CM | POA: Insufficient documentation

## 2020-11-13 LAB — CBC WITH DIFFERENTIAL/PLATELET
Abs Immature Granulocytes: 0.02 10*3/uL (ref 0.00–0.07)
Basophils Absolute: 0 10*3/uL (ref 0.0–0.1)
Basophils Relative: 1 %
Eosinophils Absolute: 0.4 10*3/uL (ref 0.0–0.5)
Eosinophils Relative: 4 %
HCT: 41.9 % (ref 39.0–52.0)
Hemoglobin: 13.2 g/dL (ref 13.0–17.0)
Immature Granulocytes: 0 %
Lymphocytes Relative: 22 %
Lymphs Abs: 1.9 10*3/uL (ref 0.7–4.0)
MCH: 27 pg (ref 26.0–34.0)
MCHC: 31.5 g/dL (ref 30.0–36.0)
MCV: 85.7 fL (ref 80.0–100.0)
Monocytes Absolute: 0.9 10*3/uL (ref 0.1–1.0)
Monocytes Relative: 10 %
Neutro Abs: 5.6 10*3/uL (ref 1.7–7.7)
Neutrophils Relative %: 63 %
Platelets: 163 10*3/uL (ref 150–400)
RBC: 4.89 MIL/uL (ref 4.22–5.81)
RDW: 15.6 % — ABNORMAL HIGH (ref 11.5–15.5)
WBC: 8.9 10*3/uL (ref 4.0–10.5)
nRBC: 0 % (ref 0.0–0.2)

## 2020-11-13 LAB — TSH: TSH: 3.28 u[IU]/mL (ref 0.350–4.500)

## 2020-11-13 LAB — COMPREHENSIVE METABOLIC PANEL
ALT: 22 U/L (ref 0–44)
AST: 28 U/L (ref 15–41)
Albumin: 4.1 g/dL (ref 3.5–5.0)
Alkaline Phosphatase: 95 U/L (ref 38–126)
Anion gap: 6 (ref 5–15)
BUN: 46 mg/dL — ABNORMAL HIGH (ref 6–20)
CO2: 26 mmol/L (ref 22–32)
Calcium: 9.1 mg/dL (ref 8.9–10.3)
Chloride: 104 mmol/L (ref 98–111)
Creatinine, Ser: 2.02 mg/dL — ABNORMAL HIGH (ref 0.61–1.24)
GFR, Estimated: 38 mL/min — ABNORMAL LOW (ref >60)
Glucose, Bld: 166 mg/dL — ABNORMAL HIGH (ref 70–99)
Potassium: 4.8 mmol/L (ref 3.5–5.1)
Sodium: 136 mmol/L (ref 135–145)
Total Bilirubin: 0.6 mg/dL (ref 0.3–1.2)
Total Protein: 7 g/dL (ref 6.5–8.1)

## 2020-11-13 LAB — T4, FREE: Free T4: 1 ng/dL (ref 0.61–1.12)

## 2020-11-13 MED ORDER — SODIUM CHLORIDE 0.9 % IV SOLN
200.0000 mg | Freq: Once | INTRAVENOUS | Status: AC
  Administered 2020-11-13: 200 mg via INTRAVENOUS
  Filled 2020-11-13: qty 8

## 2020-11-13 MED ORDER — SODIUM CHLORIDE 0.9% FLUSH
10.0000 mL | INTRAVENOUS | Status: DC | PRN
  Filled 2020-11-13: qty 10

## 2020-11-13 MED ORDER — SODIUM CHLORIDE 0.9 % IV SOLN
Freq: Once | INTRAVENOUS | Status: AC
  Filled 2020-11-13: qty 250

## 2020-11-13 NOTE — Progress Notes (Signed)
Hematology/Oncology Progress Note [facility] Telephone:([phone] [phone]) [phone]  Patient Care Team: [facility], [facility] as PCP - General (General Practice)   Name of the patient: Mark Donaldson  [id number]  1960-09-10  Date of visit: 11/13/20   INTERVAL HISTORY-  60 y.o. male presents for follow-up of renal cell carcinoma treatments. Patient has past medical history CAD status post PCI to LAD in 2018, on aspirin and Plavix, CHF, history of focal sclerosing/crescentic GN-ANCA positive and CKD stage III. He was hospitalized from 05/02/2019-05/07/2019 due to gross hematuria. CT showed a 7.4 cm left lower pole renal mass concerning for RCC.  Patient also has necrotic lymphadenopathy in the mediastinum and the right supra hilar region which were highly suspicious for metastatic disease.  Bilateral lung nodules, also concerning for metastatic lung disease.  Right retrocrural lymphadenopathy with upper normal left periaortic lymph node. Patient was recommended for cytoreductive radical nephrectomy.  With history of ANCA positive vasculitis, also recommend thoracic lymphadenopathy biopsy to confirm distal metastasis.  Patient agreed to radical nephrectomy and declined bronchoscopy biopsy. He underwent left radical nephrectomy on 05/05/2019. Pathology showed pT3a pNx, RCC, conventional clear cell type, grade 3 Patient was discharged home. Today he presents to discuss pathology and the management plan. He was accompanied by his wife and daughter. Patient reports that he uses hydrocodone for pain.  Pain around the surgical site has improved.  He occasionally has tingling sensation around the surgical site.  Denies any fever or chills, drainage from the surgical site.  # Interval CT chest abdomen pelvis was independently reviewed by me and discussed with patient. 01/12/2020, CT showed overall marked improvement.  Comparing to CT scan in August 2021,  continues to have treatment response.Mediastinal soft tissue nodule nearly completely resolved, right minor fissure nodule 8 mm, decreased in size.  Resolution of pleural fluid and thickening of the right chest.  Small pleural effusion in the left chest.  No new lung nodules.-Subtle variation of the muscle architecture of the left psoas muscle of unknown significance.-Continue observation.  He is asymptomatic. Subtle nodule in the soft tissue of the left flank.  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 He finished 14 days of albendazole plus praziquantel .   He has had few side effects including poor appetite, hair loss, weight loss, bloody stool, abdominal cramping or diarrhea. All symptoms have improved since he completed the treatment. 03/16/2020 he finished tapering course of steroids  # It has been noted that his glucose level has been elevated in December 2021, even before he was started on prednisone treatments. Dr. Steva Ready has obtained A1c which came back elevated at 8.7.  # 04/30/2020, CT chest abdomen pelvis without contrast showed stable disease.   #04/30/2020, axitinib and immunotherapy were held due to diarrhea and fatigue. #05/15/2020 resumed on immunotherapy.   #Intermittent orthopnea, symptom has resolved.  Continue Lasix Pre-existing diastolic CHF, 3/71/6967 LVEF 60 to 65%.   Recommend him to follow-up with cardiology-he is has not able to follow-up with cardiology due to lack of insurance coverage.  INTERVAL HISTORY Mark Donaldson is a 60 y.o. male who has above history reviewed by me today presents for follow up visit for management of metastatic RCC Problems and complaints are listed below: Patient is Spanish-speaking.   Online Spanish interpreter service was utilized for the entire encounter for translation Axitinib has been on hold due to diarrhea. Diarrhea has completely resolved.  No new complaints. He continues to have right shoulder pain.  He  has not talked to his primary care provider yet.    Review of systems- Review of Systems  Constitutional:  Negative for appetite change, chills, fatigue, fever and unexpected weight change.  HENT:   Negative for voice change.   Eyes:  Negative for eye problems and icterus.  Respiratory:  Negative for chest tightness and cough.   Cardiovascular:  Negative for chest pain and leg swelling.  Gastrointestinal:  Negative for abdominal distention, abdominal pain and diarrhea.  Endocrine: Negative for hot flashes.  Genitourinary:  Negative for difficulty urinating, dysuria and frequency.   Musculoskeletal:  Positive for arthralgias.       Right shoulder pain  Skin:  Negative for itching and rash.  Neurological:  Negative for light-headedness and numbness.  Hematological:  Negative for adenopathy. Does not bruise/bleed easily.  Psychiatric/Behavioral:  Negative for confusion.    No Known Allergies  Patient Active Problem List   Diagnosis Date Noted   History of CHF (congestive heart failure) 05/01/2020   Hyperglycemia 02/28/2020   Stage 3b chronic kidney disease ([place]) 10/04/2019   Brain lesion 10/04/2019   Encounter for antineoplastic immunotherapy 07/19/2019   Encounter for antineoplastic chemotherapy 07/19/2019   CNS lesion 07/07/2019   Anemia in stage 3b chronic kidney disease ([place]) 06/07/2019   Goals of care, counseling/discussion 05/23/2019   Renal cell carcinoma of left kidney ([place]) 05/23/2019   Renal cell carcinoma ([place]) 05/23/2019   Palliative care encounter    History of vasculitis    Thoracic lymphadenopathy    Lung nodule    CAD S/P percutaneous coronary angioplasty 05/02/2019   Kidney mass 05/02/2019   Hydronephrosis of left kidney 05/02/2019   UTI (urinary tract infection) 05/02/2019   AKI (acute kidney injury) ([place]) 05/02/2019   Gross hematuria 05/02/2019   Chronic diastolic CHF (congestive heart failure) ([place]) 05/02/2019   Sepsis ([place]) 05/02/2019   Severe sepsis  ([place]) 05/02/2019   Preop cardiovascular exam    Hyperlipidemia 05/21/2016   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) 05/23/2019   Renal disorder      Past Surgical History:  Procedure Laterality Date   CARDIAC SURGERY     cardiac cath with 2 stent placement   LAPAROSCOPIC NEPHRECTOMY, HAND ASSISTED Left 05/05/2019   Procedure: HAND ASSISTED LAPAROSCOPIC NEPHRECTOMY;  Surgeon: Hollice Espy, MD;  Location: [facility] ORS;  Service: Urology;  Laterality: Left;    Social History   Socioeconomic History   Marital status: Married    Spouse name: Not on file   Number of children: Not on file   Years of education: Not on file   Highest education level: Not on file  Occupational History   Not on file  Tobacco Use   Smoking status: Never   Smokeless tobacco: Never  Substance and Sexual Activity   Alcohol use: Yes   Drug use: Not on file   Sexual activity: Not on file  Other Topics Concern   Not on file  Social History Narrative   Not on  file   Social Determinants of Health   Financial Resource Strain: Not on file  Food Insecurity: Not on file  Transportation Needs: Not on file  Physical Activity: Not on file  Stress: Not on file  Social Connections: Not on file  Intimate Partner Violence: Not on file     Family History  Problem Relation Age of Onset   Cancer Mother    Blindness Brother      Current Outpatient Medications:    aspirin 81 MG EC tablet, Take 81 mg by mouth daily. Swallow whole., Disp: ,  Rfl:    atorvastatin (LIPITOR) 80 MG tablet, Take 80 mg by mouth daily., Disp: , Rfl:    clopidogrel (PLAVIX) 75 MG tablet, Take 75 mg by mouth daily., Disp: , Rfl:    furosemide (LASIX) 20 MG tablet, Take 1 tablet (20 mg total) by mouth daily., Disp: 30 tablet, Rfl: 3   KEYTRUDA 100 MG/4ML SOLN, INJECT 200MG  INTRAVENOUSLY EVERY 3 WEEKS, Disp: 8 mL, Rfl: 11   lisinopril (ZESTRIL) 10 MG tablet, Take 10 mg by mouth daily., Disp: , Rfl:    metoprolol succinate (TOPROL-XL) 100 MG 24 hr tablet, Take 1.5 tablets by mouth daily., Disp: , Rfl:    axitinib (INLYTA) 5 MG tablet, Take 1 tablet (5 mg total) by mouth 2 (two) times daily. (Patient not taking: Reported on 11/13/2020), Disp: 60 tablet, Rfl: 1   loperamide (IMODIUM) 2 MG capsule, Take 1 capsule (2 mg total) by mouth See admin instructions. Take 2 tablets with onset of diarrhea, then 1 tablet for every 2 hours until diarrhea stops. Maximum 8 tablets per 24 hours. (Patient not taking: Reported on 11/13/2020), Disp: 60 capsule, Rfl: 2   Physical exam:  Vitals:   11/13/20 0936  BP: (!) 122/97  Pulse: 67  Resp: 18  Temp: (!) 97.2 F (36.2 C)  Weight: 156 lb 4.8 oz (70.9 kg)   Physical Exam Constitutional:      General: He is not in acute distress.    Appearance: He is not diaphoretic.  HENT:     Head: Normocephalic and atraumatic.     Nose: Nose normal.     Mouth/Throat:     Pharynx: No oropharyngeal exudate.  Eyes:     General: No scleral icterus.    Pupils: Pupils are equal, round, and reactive to light.  Cardiovascular:     Rate and Rhythm: Normal rate and regular rhythm.     Heart sounds: No murmur heard. Pulmonary:     Effort: Pulmonary effort is normal. No respiratory distress.     Breath sounds: No rales.  Chest:     Chest wall: No tenderness.  Abdominal:     General: There is no distension.     Palpations: Abdomen is soft.     Tenderness: There is no abdominal tenderness.  Musculoskeletal:        General: Normal range  of motion.     Cervical back: Normal range of motion and neck supple.     Comments: Right shoulder range of motion limited due to pain.  Skin:    General: Skin is warm and dry.     Findings: No erythema.  Neurological:     Mental Status: He is alert and oriented to person, place, and time.     Cranial Nerves: No cranial nerve deficit.     Motor: No abnormal muscle tone.     Coordination: Coordination normal.  Psychiatric:  Mood and Affect: Affect normal.       CMP Latest Ref Rng & Units 11/13/2020  Glucose 70 - 99 mg/dL 166(H)  BUN 6 - 20 mg/dL 46(H)  Creatinine 0.61 - 1.24 mg/dL 2.02(H)  Sodium 135 - 145 mmol/L 136  Potassium 3.5 - 5.1 mmol/L 4.8  Chloride 98 - 111 mmol/L 104  CO2 22 - 32 mmol/L 26  Calcium 8.9 - 10.3 mg/dL 9.1  Total Protein 6.5 - 8.1 g/dL 7.0  Total Bilirubin 0.3 - 1.2 mg/dL 0.6  Alkaline Phos 38 - 126 U/L 95  AST 15 - 41 U/L 28  ALT 0 - 44 U/L 22   CBC Latest Ref Rng & Units 11/13/2020  WBC 4.0 - 10.5 K/uL 8.9  Hemoglobin 13.0 - 17.0 g/dL 13.2  Hematocrit 39.0 - 52.0 % 41.9  Platelets 150 - 400 K/uL 163    RADIOGRAPHIC STUDIES: I have personally reviewed the radiological images as listed and agreed with the findings in the report. DG Shoulder Right  Result Date: 10/26/2020 CLINICAL DATA:  Right shoulder pain. EXAM: RIGHT SHOULDER - 2+ VIEW COMPARISON:  None. FINDINGS: Normal alignment and joint spaces. There is no evidence of fracture or dislocation. There is no evidence of arthropathy or other focal bone abnormality. The included right hemithorax is unremarkable. Soft tissues are unremarkable. IMPRESSION: Negative radiographs of the right shoulder. Electronically Signed   By: Keith Rake M.D.   On: 10/26/2020 18:26     Assessment and plan-  Patient is a 60 y.o. male history of CAD, status post stent, CHF, history of ANCA positive sclerosing/crescentic GN, chronic kidney disease stage III presents for treatments of stage IV renal cell  carcinoma 1. Renal cell carcinoma of left kidney (HCC)   2. Encounter for antineoplastic immunotherapy   3. History of CHF (congestive heart failure)   4. Chronic right shoulder pain   5. Neurocysticercosis   Cancer Staging Renal cell carcinoma of left kidney ([place]) Staging form: Kidney, AJCC 8th Edition - Pathologic: Stage IV (pT3a, pNX, cM1) - Signed by Earlie Server, MD on 05/23/2019   # Stage IV renal cell carcinoma 07/31/2020, CT chest abdomen pelvis showed stable disease Labs reviewed and discussed with patient Proceed with Keytruda today. Continue hold axitinib. He previously did not tolerate due to axitinib induced diarrhea/worse of kidney function.. Repeat CT chest abdomen pelvis without contrast for restaging.  #Hypertension, CHF,  Continue metoprolol, lisinopril.  Lasix Stable.   #Brain lesion-  neurocysticercosis finished therapy.  Follow-up with ID There is a new lesion, Dr.Ravishanka plans to consult expert and let patient know.  #CKD, avoid nephrotoxins. Encourage oral hydration.  # right shoulder pain, check x ray shows no acute process..  Tylenol as needed.  Recommend patient to discuss with primary care provider for orthopedic referral.   Online Spanish interpreter presented for the entire encounter for translation. We spent sufficient time to discuss many aspect of care, questions were answered to patient's satisfaction. Follow-up in 3 weeks for follow-up of evaluation prior to immunotherapy.  Earlie Server, MD, PhD Hematology Oncology [facility] at [facility] Pager- [phone] 11/13/2020

## 2020-11-13 NOTE — Patient Instructions (Signed)
[facility] [place] [facility]  Discharge Instructions: Thank you for choosing [facility] to provide your oncology and hematology care.  If you have a lab appointment with the Cancer Center, please go directly to the Cancer Center and check in at the registration area.  Wear comfortable clothing and clothing appropriate for easy access to any Portacath or PICC line.   We strive to give you quality time with your provider. You may need to reschedule your appointment if you arrive late (15 or more minutes).  Arriving late affects you and other patients whose appointments are after yours.  Also, if you miss three or more appointments without notifying the office, you may be dismissed from the clinic at the provider's discretion.      For prescription refill requests, have your pharmacy contact our office and allow 72 hours for refills to be completed.    Today you received the following chemotherapy and/or immunotherapy agents - pembrolizumab      To help prevent nausea and vomiting after your treatment, we encourage you to take your nausea medication as directed.  BELOW ARE SYMPTOMS THAT SHOULD BE REPORTED IMMEDIATELY: *FEVER GREATER THAN 100.4 F (38 C) OR HIGHER *CHILLS OR SWEATING *NAUSEA AND VOMITING THAT IS NOT CONTROLLED WITH YOUR NAUSEA MEDICATION *UNUSUAL SHORTNESS OF BREATH *UNUSUAL BRUISING OR BLEEDING *URINARY PROBLEMS (pain or burning when urinating, or frequent urination) *BOWEL PROBLEMS (unusual diarrhea, constipation, pain near the anus) TENDERNESS IN MOUTH AND THROAT WITH OR WITHOUT PRESENCE OF ULCERS (sore throat, sores in mouth, or a toothache) UNUSUAL RASH, SWELLING OR PAIN  UNUSUAL VAGINAL DISCHARGE OR ITCHING   Items with * indicate a potential emergency and should be followed up as soon as possible or go to the Emergency Department if any problems should occur.  Please show the CHEMOTHERAPY ALERT CARD or IMMUNOTHERAPY ALERT CARD at  check-in to the Emergency Department and triage nurse.  Should you have questions after your visit or need to cancel or reschedule your appointment, please contact [facility] [place] [facility]  [phone] and follow the prompts.  Office hours are 8:00 a.m. to 4:30 p.m. Monday - Friday. Please note that voicemails left after 4:00 p.m. may not be returned until the following business day.  We are closed weekends and major holidays. You have access to a nurse at all times for urgent questions. Please call the main number to the clinic [phone] and follow the prompts.  For any non-urgent questions, you may also contact your provider using MyChart. We now offer e-Visits for anyone 18 and older to request care online for non-urgent symptoms. For details visit [email or web address].   Also download the MyChart app! Go to the app store, search "MyChart", open the app, select , and log in with your MyChart username and password.  Due to Covid, a mask is required upon entering the hospital/clinic. If you do not have a mask, one will be given to you upon arrival. For doctor visits, patients may have 1 support person aged 18 or older with them. For treatment visits, patients cannot have anyone with them due to current Covid guidelines and our immunocompromised population.   Pembrolizumab injection What is this medication? PEMBROLIZUMAB (pem broe liz ue mab) is a monoclonal antibody. It is used to treat certain types of cancer. This medicine may be used for other purposes; ask your health care provider or pharmacist if you have questions. COMMON BRAND NAME(S): Keytruda What should I tell my care   team before I take this medication? They need to know if you have any of these conditions: autoimmune diseases like Crohn's disease, ulcerative colitis, or lupus have had or planning to have an allogeneic stem cell transplant (uses someone else's stem cells) history of organ  transplant history of chest radiation nervous system problems like myasthenia gravis or Guillain-Barre syndrome an unusual or allergic reaction to pembrolizumab, other medicines, foods, dyes, or preservatives pregnant or trying to get pregnant breast-feeding How should I use this medication? This medicine is for infusion into a vein. It is given by a health care professional in a hospital or clinic setting. A special MedGuide will be given to you before each treatment. Be sure to read this information carefully each time. Talk to your pediatrician regarding the use of this medicine in children. While this drug may be prescribed for children as young as 6 months for selected conditions, precautions do apply. Overdosage: If you think you have taken too much of this medicine contact a poison control center or emergency room at once. NOTE: This medicine is only for you. Do not share this medicine with others. What if I miss a dose? It is important not to miss your dose. Call your doctor or health care professional if you are unable to keep an appointment. What may interact with this medication? Interactions have not been studied. This list may not describe all possible interactions. Give your health care provider a list of all the medicines, herbs, non-prescription drugs, or dietary supplements you use. Also tell them if you smoke, drink alcohol, or use illegal drugs. Some items may interact with your medicine. What should I watch for while using this medication? Your condition will be monitored carefully while you are receiving this medicine. You may need blood work done while you are taking this medicine. Do not become pregnant while taking this medicine or for 4 months after stopping it. Women should inform their doctor if they wish to become pregnant or think they might be pregnant. There is a potential for serious side effects to an unborn child. Talk to your health care professional or  pharmacist for more information. Do not breast-feed an infant while taking this medicine or for 4 months after the last dose. What side effects may I notice from receiving this medication? Side effects that you should report to your doctor or health care professional as soon as possible: allergic reactions like skin rash, itching or hives, swelling of the face, lips, or tongue bloody or black, tarry breathing problems changes in vision chest pain chills confusion constipation cough diarrhea dizziness or feeling faint or lightheaded fast or irregular heartbeat fever flushing joint pain low blood counts - this medicine may decrease the number of white blood cells, red blood cells and platelets. You may be at increased risk for infections and bleeding. muscle pain muscle weakness pain, tingling, numbness in the hands or feet persistent headache redness, blistering, peeling or loosening of the skin, including inside the mouth signs and symptoms of high blood sugar such as dizziness; dry mouth; dry skin; fruity breath; nausea; stomach pain; increased hunger or thirst; increased urination signs and symptoms of kidney injury like trouble passing urine or change in the amount of urine signs and symptoms of liver injury like dark urine, light-colored stools, loss of appetite, nausea, right upper belly pain, yellowing of the eyes or skin sweating swollen lymph nodes weight loss Side effects that usually do not require medical attention (report to your doctor   or health care professional if they continue or are bothersome): decreased appetite hair loss tiredness This list may not describe all possible side effects. Call your doctor for medical advice about side effects. You may report side effects to FDA at 1-800-FDA-1088. Where should I keep my medication? This drug is given in a hospital or clinic and will not be stored at home. NOTE: This sheet is a summary. It may not cover all possible  information. If you have questions about this medicine, talk to your doctor, pharmacist, or health care provider.  2022 Elsevier/Gold Standard (2018-12-01 21:44:53)  

## 2020-11-13 NOTE — Progress Notes (Signed)
Pt here for follow up. No new concerns voiced.   

## 2020-11-27 ENCOUNTER — Ambulatory Visit: Admission: RE | Admit: 2020-11-27 | Payer: Self-pay | Source: Ambulatory Visit

## 2020-11-28 ENCOUNTER — Other Ambulatory Visit: Payer: Self-pay

## 2020-11-28 ENCOUNTER — Ambulatory Visit
Admission: RE | Admit: 2020-11-28 | Discharge: 2020-11-28 | Disposition: A | Discharge status: Home / Self Care | Payer: Self-pay | Source: Ambulatory Visit | Attending: Oncology | Admitting: Oncology

## 2020-11-28 DIAGNOSIS — C642 Malignant neoplasm of left kidney, except renal pelvis: Secondary | ICD-10-CM | POA: Insufficient documentation

## 2020-11-30 ENCOUNTER — Other Ambulatory Visit: Payer: Self-pay | Admitting: Oncology

## 2020-12-04 ENCOUNTER — Inpatient Hospital Stay (HOSPITAL_BASED_OUTPATIENT_CLINIC_OR_DEPARTMENT_OTHER): Payer: Self-pay | Admitting: Oncology

## 2020-12-04 ENCOUNTER — Other Ambulatory Visit: Payer: Self-pay

## 2020-12-04 ENCOUNTER — Inpatient Hospital Stay: Payer: Self-pay

## 2020-12-04 ENCOUNTER — Encounter: Payer: Self-pay | Admitting: Oncology

## 2020-12-04 VITALS — BP 145/85 | HR 64 | Temp 97.6°F | Resp 18 | Wt 160.3 lb

## 2020-12-04 DIAGNOSIS — C642 Malignant neoplasm of left kidney, except renal pelvis: Secondary | ICD-10-CM

## 2020-12-04 DIAGNOSIS — R7989 Other specified abnormal findings of blood chemistry: Secondary | ICD-10-CM

## 2020-12-04 LAB — COMPREHENSIVE METABOLIC PANEL
ALT: 67 U/L — ABNORMAL HIGH (ref 0–44)
AST: 69 U/L — ABNORMAL HIGH (ref 15–41)
Albumin: 3.9 g/dL (ref 3.5–5.0)
Alkaline Phosphatase: 99 U/L (ref 38–126)
Anion gap: 6 (ref 5–15)
BUN: 49 mg/dL — ABNORMAL HIGH (ref 6–20)
CO2: 27 mmol/L (ref 22–32)
Calcium: 9.2 mg/dL (ref 8.9–10.3)
Chloride: 105 mmol/L (ref 98–111)
Creatinine, Ser: 1.99 mg/dL — ABNORMAL HIGH (ref 0.61–1.24)
GFR, Estimated: 38 mL/min — ABNORMAL LOW (ref >60)
Glucose, Bld: 105 mg/dL — ABNORMAL HIGH (ref 70–99)
Potassium: 4.5 mmol/L (ref 3.5–5.1)
Sodium: 138 mmol/L (ref 135–145)
Total Bilirubin: 0.6 mg/dL (ref 0.3–1.2)
Total Protein: 7 g/dL (ref 6.5–8.1)

## 2020-12-04 LAB — CBC WITH DIFFERENTIAL/PLATELET
Abs Immature Granulocytes: 0.03 10*3/uL (ref 0.00–0.07)
Basophils Absolute: 0.1 10*3/uL (ref 0.0–0.1)
Basophils Relative: 1 %
Eosinophils Absolute: 0.3 10*3/uL (ref 0.0–0.5)
Eosinophils Relative: 4 %
HCT: 41.3 % (ref 39.0–52.0)
Hemoglobin: 13 g/dL (ref 13.0–17.0)
Immature Granulocytes: 0 %
Lymphocytes Relative: 21 %
Lymphs Abs: 1.8 10*3/uL (ref 0.7–4.0)
MCH: 27.1 pg (ref 26.0–34.0)
MCHC: 31.5 g/dL (ref 30.0–36.0)
MCV: 86.2 fL (ref 80.0–100.0)
Monocytes Absolute: 1 10*3/uL (ref 0.1–1.0)
Monocytes Relative: 12 %
Neutro Abs: 5.6 10*3/uL (ref 1.7–7.7)
Neutrophils Relative %: 62 %
Platelets: 170 10*3/uL (ref 150–400)
RBC: 4.79 MIL/uL (ref 4.22–5.81)
RDW: 14.6 % (ref 11.5–15.5)
WBC: 8.9 10*3/uL (ref 4.0–10.5)
nRBC: 0 % (ref 0.0–0.2)

## 2020-12-04 MED ORDER — SODIUM CHLORIDE 0.9 % IV SOLN
Freq: Once | INTRAVENOUS | Status: AC
  Filled 2020-12-04: qty 250

## 2020-12-04 MED ORDER — SODIUM CHLORIDE 0.9 % IV SOLN
200.0000 mg | Freq: Once | INTRAVENOUS | Status: AC
  Administered 2020-12-04: 200 mg via INTRAVENOUS
  Filled 2020-12-04: qty 8

## 2020-12-04 NOTE — Progress Notes (Addendum)
Hematology/Oncology Progress Note [facility] [phone] [phone]  Patient Care Team: [facility], [facility] as PCP - General (General Practice)   Name of the patient: Mark Donaldson  [id number]  [birth year]  Date of visit: 12/04/20   INTERVAL HISTORY-  60 y.o. male presents for follow-up of renal cell carcinoma treatments. Patient has past medical history CAD status post PCI to LAD in 2018, on aspirin and Plavix, CHF, history of focal sclerosing/crescentic GN-ANCA positive and CKD stage III. He was hospitalized from 05/02/2019-05/07/2019 due to gross hematuria. CT showed a 7.4 cm left lower pole renal mass concerning for RCC.  Patient also has necrotic lymphadenopathy in the mediastinum and the right supra hilar region which were highly suspicious for metastatic disease.  Bilateral lung nodules, also concerning for metastatic lung disease.  Right retrocrural lymphadenopathy with upper normal left periaortic lymph node. Patient was recommended for cytoreductive radical nephrectomy.  With history of ANCA positive vasculitis, also recommend thoracic lymphadenopathy biopsy to confirm distal metastasis.  Patient agreed to radical nephrectomy and declined bronchoscopy biopsy. He underwent left radical nephrectomy on 05/05/2019. Pathology showed pT3a pNx, RCC, conventional clear cell type, grade 3 Patient was discharged home. Today he presents to discuss pathology and the management plan. He was accompanied by his wife and daughter. Patient reports that he uses hydrocodone for pain.  Pain around the surgical site has improved.  He occasionally has tingling sensation around the surgical site.  Denies any fever or chills, drainage from the surgical site.  # Interval CT chest abdomen pelvis was independently reviewed by me and discussed with patient. 01/12/2020, CT showed overall marked improvement.  Comparing to CT scan in August 2021,  continues to have treatment response.Mediastinal soft tissue nodule nearly completely resolved, right minor fissure nodule 8 mm, decreased in size.  Resolution of pleural fluid and thickening of the right chest.  Small pleural effusion in the left chest.  No new lung nodules.-Subtle variation of the muscle architecture of the left psoas muscle of unknown significance.-Continue observation.  He is asymptomatic. Subtle nodule in the soft tissue of the left flank.  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 He finished 14 days of albendazole plus praziquantel .   He has had few side effects including poor appetite, hair loss, weight loss, bloody stool, abdominal cramping or diarrhea. All symptoms have improved since he completed the treatment. 03/16/2020 he finished tapering course of steroids  # It has been noted that his glucose level has been elevated in December 2021, even before he was started on prednisone treatments. Dr. Steva Ready has obtained A1c which came back elevated at 8.7.  # 04/30/2020, CT chest abdomen pelvis without contrast showed stable disease.   #04/30/2020, axitinib and immunotherapy were held due to diarrhea and fatigue. #05/15/2020 resumed on immunotherapy.   #Intermittent orthopnea, symptom has resolved.  Continue Lasix Pre-existing diastolic CHF, 0/93/2671 LVEF 60 to 65%.   Recommend him to follow-up with cardiology-he is has not able to follow-up with cardiology due to lack of insurance coverage.   INTERVAL HISTORY Patient is here for follow-up for management of metastatic renal cell carcinoma.  He is currently on axitinib and [place] which was recently held secondary to diarrhea.  Today, he states he is doing well.  He is anxious to hear about recent imaging.  Denies any new symptoms.  Is eating and gaining weight.  Continues to have right shoulder pain but feels this is improving.Denies any abdominal concerns.  Review of systems- Review of Systems   Musculoskeletal:  Positive for arthralgias (Right shoulder).   No Known Allergies  Patient Active Problem List   Diagnosis Date Noted   History of CHF (congestive heart failure) 05/01/2020   Hyperglycemia 02/28/2020   Stage 3b chronic kidney disease ([place]) 10/04/2019   Brain lesion 10/04/2019   Encounter for antineoplastic immunotherapy 07/19/2019   Encounter for antineoplastic chemotherapy 07/19/2019   CNS lesion 07/07/2019   Anemia in stage 3b chronic kidney disease ([place]) 06/07/2019   Goals of care, counseling/discussion 05/23/2019   Renal cell carcinoma of left kidney ([place]) 05/23/2019   Renal cell carcinoma ([place]) 05/23/2019   Palliative care encounter    History of vasculitis    Thoracic lymphadenopathy    Lung nodule    CAD S/P percutaneous coronary angioplasty 05/02/2019   Kidney mass 05/02/2019   Hydronephrosis of left kidney 05/02/2019   UTI (urinary tract infection) 05/02/2019   AKI (acute kidney injury) ([place]) 05/02/2019   Gross hematuria 05/02/2019   Chronic diastolic CHF (congestive heart failure) ([place]) 05/02/2019   Sepsis ([place]) 05/02/2019   Severe sepsis ([place]) 05/02/2019   Preop cardiovascular exam    Hyperlipidemia 05/21/2016   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 ([place]) 05/21/2016   Prediabetes 05/21/2016   Alcohol abuse 05/18/2016   Essential hypertension 05/18/2016   Vasculitis, ANCA positive 05/18/2016   Shortness of breath 05/17/2016     Past Medical History:  Diagnosis Date   ANCA-positive vasculitis    CAD (coronary artery disease)    Essential hypertension    HFrEF (heart failure with reduced ejection fraction) (HCC)    Hyperlipidemia LDL goal <70    Ischemic cardiomyopathy    Renal cell carcinoma ([place]) 05/23/2019   Renal disorder      Past Surgical History:  Procedure Laterality Date   CARDIAC SURGERY     cardiac cath with 2 stent placement   LAPAROSCOPIC NEPHRECTOMY, HAND ASSISTED Left 05/05/2019   Procedure: HAND ASSISTED  LAPAROSCOPIC NEPHRECTOMY;  Surgeon: Hollice Espy, MD;  Location: [facility] ORS;  Service: Urology;  Laterality: Left;    Social History   Socioeconomic History   Marital status: Married    Spouse name: Not on file   Number of children: Not on file   Years of education: Not on file   Highest education level: Not on file  Occupational History   Not on file  Tobacco Use   Smoking status: Never   Smokeless tobacco: Never  Substance and Sexual Activity   Alcohol use: Yes   Drug use: Not on file   Sexual activity: Not on file  Other Topics Concern   Not on file  Social History Narrative   Not on file   Social Determinants of Health   Financial Resource Strain: Not on file  Food Insecurity: Not on file  Transportation Needs: Not on file  Physical Activity: Not on file  Stress: Not on file  Social Connections: Not on file  Intimate Partner Violence: Not on file     Family History  Problem Relation Age of Onset   Cancer Mother    Blindness Brother      Current Outpatient Medications:    aspirin 81 MG EC tablet, Take 81 mg by mouth daily. Swallow whole., Disp: , Rfl:    atorvastatin (LIPITOR) 80 MG tablet, Take 80 mg by mouth daily., Disp: , Rfl:    axitinib (INLYTA) 5 MG tablet, Take 1 tablet (5 mg total) by mouth 2 (  two) times daily. (Patient not taking: Reported on 11/13/2020), Disp: 60 tablet, Rfl: 1   clopidogrel (PLAVIX) 75 MG tablet, Take 75 mg by mouth daily., Disp: , Rfl:    furosemide (LASIX) 20 MG tablet, Take 1 tablet (20 mg total) by mouth daily., Disp: 30 tablet, Rfl: 3   KEYTRUDA 100 MG/4ML SOLN, INJECT 200MG  INTRAVENOUSLY EVERY 3 WEEKS, Disp: 8 mL, Rfl: 11   lisinopril (ZESTRIL) 10 MG tablet, Take 10 mg by mouth daily., Disp: , Rfl:    loperamide (IMODIUM) 2 MG capsule, Take 1 capsule (2 mg total) by mouth See admin instructions. Take 2 tablets with onset of diarrhea, then 1 tablet for every 2 hours until diarrhea stops. Maximum 8 tablets per 24 hours. (Patient  not taking: Reported on 11/13/2020), Disp: 60 capsule, Rfl: 2   metoprolol succinate (TOPROL-XL) 100 MG 24 hr tablet, Take 1.5 tablets by mouth daily., Disp: , Rfl:    Physical exam:  There were no vitals filed for this visit.  Physical Exam Constitutional:      Appearance: Normal appearance.  HENT:     Head: Normocephalic and atraumatic.  Eyes:     Pupils: Pupils are equal, round, and reactive to light.  Cardiovascular:     Rate and Rhythm: Normal rate and regular rhythm.     Heart sounds: Normal heart sounds. No murmur heard. Pulmonary:     Effort: Pulmonary effort is normal.     Breath sounds: Normal breath sounds. No wheezing.  Abdominal:     General: Bowel sounds are normal. There is no distension.     Palpations: Abdomen is soft.     Tenderness: There is no abdominal tenderness.  Musculoskeletal:     Right shoulder: Decreased range of motion.     Cervical back: Normal range of motion.  Skin:    General: Skin is warm and dry.     Findings: No rash.  Neurological:     Mental Status: He is alert and oriented to person, place, and time.     Gait: Gait is intact.  Psychiatric:        Mood and Affect: Mood and affect normal.        Cognition and Memory: Memory normal.        Judgment: Judgment normal.       CMP Latest Ref Rng & Units 11/13/2020  Glucose 70 - 99 mg/dL 166(H)  BUN 6 - 20 mg/dL 46(H)  Creatinine 0.61 - 1.24 mg/dL 2.02(H)  Sodium 135 - 145 mmol/L 136  Potassium 3.5 - 5.1 mmol/L 4.8  Chloride 98 - 111 mmol/L 104  CO2 22 - 32 mmol/L 26  Calcium 8.9 - 10.3 mg/dL 9.1  Total Protein 6.5 - 8.1 g/dL 7.0  Total Bilirubin 0.3 - 1.2 mg/dL 0.6  Alkaline Phos 38 - 126 U/L 95  AST 15 - 41 U/L 28  ALT 0 - 44 U/L 22   CBC Latest Ref Rng & Units 11/13/2020  WBC 4.0 - 10.5 K/uL 8.9  Hemoglobin 13.0 - 17.0 g/dL 13.2  Hematocrit 39.0 - 52.0 % 41.9  Platelets 150 - 400 K/uL 163    RADIOGRAPHIC STUDIES: I have personally reviewed the radiological images as listed  and agreed with the findings in the report. DG Shoulder Right  Result Date: 10/26/2020 CLINICAL DATA:  Right shoulder pain. EXAM: RIGHT SHOULDER - 2+ VIEW COMPARISON:  None. FINDINGS: Normal alignment and joint spaces. There is no evidence of fracture or dislocation. There is no evidence  of arthropathy or other focal bone abnormality. The included right hemithorax is unremarkable. Soft tissues are unremarkable. IMPRESSION: Negative radiographs of the right shoulder. Electronically Signed   By: Keith Rake M.D.   On: 10/26/2020 18:26   CT CHEST ABDOMEN PELVIS WO CONTRAST  Result Date: 11/28/2020 CLINICAL DATA:  History of metastatic RCC status post left nephrectomy without ongoing chemotherapy EXAM: CT CHEST, ABDOMEN AND PELVIS WITHOUT CONTRAST TECHNIQUE: Multidetector CT imaging of the chest, abdomen and pelvis was performed following the standard protocol without IV contrast. COMPARISON:  Multiple priors including most recent CT July 31, 2020 FINDINGS: CT CHEST FINDINGS Cardiovascular: No thoracic aortic aneurysm. Normal size heart. Normal caliber central pulmonary arteries. No significant pericardial effusion/thickening. Three-vessel coronary artery calcifications. Mediastinum/Nodes: No supraclavicular adenopathy. No discrete thyroid nodularity. No pathologically enlarged mediastinal, hilar or axillary lymph nodes within the limitation of noncontrast examination. Stable paramediastinal soft tissue nodularity for instance on image 21/3 measuring 4 mm. No new or enlarging lesions. The trachea and esophagus are grossly unremarkable. Lungs/Pleura: Stable to minimally decreased size of the pulmonary nodule along the minor fissure in the right chest now measuring 5 mm on image 79/4 previously 6 mm. No new suspicious pulmonary nodules or masses. Mild centrilobular emphysema. Diffuse bronchial wall thickening. Near complete resolution of the previously seen diffuse ground-glass micro nodularity. Stable  partially loculated fluid along the left major fissure. No pneumothorax. Musculoskeletal: No aggressive lytic or blastic lesion of bone. CT ABDOMEN PELVIS FINDINGS Hepatobiliary: Unremarkable noncontrast appearance of the hepatic parenchyma. Gallbladder is unremarkable. No biliary ductal dilation. Pancreas: No pancreatic ductal dilation or evidence of acute inflammation. Spleen: Within normal limits. Adrenals/Urinary Tract: Bilateral adrenal glands appear normal. Stable postsurgical changes of left nephrectomy without new suspicious soft tissue nodularity visualized in the nephrectomy bed. Right kidney is unremarkable without hydronephrosis or contour deforming renal mass. Mild symmetric wall thickening of an incompletely distended urinary bladder, similar prior. Stomach/Bowel: Radiopaque enteric contrast material traverses the rectum. Stomach is unremarkable for degree of distension. No pathologic dilation of small or large bowel. The appendix and terminal ileum appear normal. No evidence of acute bowel inflammation. Sigmoid colonic diverticulosis without findings of acute diverticulitis. Vascular/Lymphatic: No abdominal aortic aneurysm. No pathologically enlarged abdominal or pelvic lymph nodes. Reproductive: Mild enlargement of prostate gland. Other: No significant abdominopelvic free fluid. Musculoskeletal: Multilevel degenerative changes spine. Similar avascular necrosis of the bilateral femoral heads. No aggressive lytic or blastic lesion of bone. IMPRESSION: 1. Overall stable examination status postsurgical left nephrectomy without evidence of new or progressive disease in the chest abdomen or pelvis. 2. Stable to minimally decreased size of the 5 mm pulmonary nodule along the minor fissure in the right chest. No new suspicious pulmonary nodules or masses. 3. Sigmoid colonic diverticulosis without findings of acute diverticulitis. 4. Avascular necrosis of the bilateral femoral heads. 5. Aortic Atherosclerosis  (ICD10-I70.0) and Emphysema (ICD10-J43.9). Electronically Signed   By: Dahlia Bailiff M.D.   On: 11/28/2020 15:46     Assessment and plan-patient is a 60 year old male with history of CAD status post stent, CHF, history of ANCA positive sclerosing/crescentic GN, CKD stage III who is here for evaluation prior to treatment for stage IV renal cell carcinoma.   No diagnosis found.  Cancer Staging  Renal cell carcinoma of left kidney (HCC) Staging form: Kidney, AJCC 8th Edition - Pathologic: Stage IV (pT3a, pNX, cM1) - Signed by Earlie Server, MD on 05/23/2019   Stage IV renal cell carcinoma- Imaging from 07/31/2020 showed stable disease.  He recently had restaging scans on 11/28/2020 which showed overall stable postsurgical left nephrectomy without evidence of new or progressive disease in chest abdomen or pelvis.  Decreased pulmonary nodule in right chest.  No new suspicious pulmonary nodules or mass.  We are currently holding axitinib secondary to poor tolerance.  Currently on Keytruda only.  Reviewed labs with patient which are stable and acceptable for treatment.  Proceed with Keytruda today.  Continue to hold axitinib.   Brain lesion- Neurocysticerosis and is followed by ID.  Patient will follow up with Dr. Steva Ready.  CKD- Encouraged hydration and to avoid nephrotoxins.  Right shoulder pain- Imaging was negative for acute issues.  Continue Tylenol.  States this is improving.  Slightly elevated LFTs- AST 69 ALT 67.  Unclear etiology.  Denies any new medications.  He has been on Keytruda for quite some time.  Proceed with Keytruda but will have him rtc next week to recheck LFTs.  Per up-to-date can cause acute hepatitis (<3%).  Denies abdominal concerns.  Disposition- Keytruda today.  Return to clinic in 3 weeks for lab work, see Dr. Tasia Catchings and Beryle Flock.  I spent 25 minutes dedicated to the care of this patient (face-to-face and non-face-to-face) on the date of the encounter to include what is  described in the assessment and plan.  Faythe Casa, NP 12/04/2020 9:22 AM

## 2020-12-04 NOTE — Addendum Note (Signed)
Addended by: Faythe Casa E on: 12/04/2020 09:39 AM   Modules accepted: Orders

## 2020-12-04 NOTE — Progress Notes (Signed)
Pt here for follow up. No new concerns voiced.   

## 2020-12-04 NOTE — Patient Instructions (Signed)
[facility]  Discharge Instructions: Thank you for choosing [facility] to provide your oncology and hematology care.  If you have a lab appointment with the Cancer Center, please go directly to the Cancer Center and check in at the registration area.  Wear comfortable clothing and clothing appropriate for easy access to any Portacath or PICC line.   We strive to give you quality time with your provider. You may need to reschedule your appointment if you arrive late (15 or more minutes).  Arriving late affects you and other patients whose appointments are after yours.  Also, if you miss three or more appointments without notifying the office, you may be dismissed from the clinic at the provider's discretion.      For prescription refill requests, have your pharmacy contact our office and allow 72 hours for refills to be completed.    Today you received the following chemotherapy and/or immunotherapy agents : Keytruda    To help prevent nausea and vomiting after your treatment, we encourage you to take your nausea medication as directed.  BELOW ARE SYMPTOMS THAT SHOULD BE REPORTED IMMEDIATELY: *FEVER GREATER THAN 100.4 F (38 C) OR HIGHER *CHILLS OR SWEATING *NAUSEA AND VOMITING THAT IS NOT CONTROLLED WITH YOUR NAUSEA MEDICATION *UNUSUAL SHORTNESS OF BREATH *UNUSUAL BRUISING OR BLEEDING *URINARY PROBLEMS (pain or burning when urinating, or frequent urination) *BOWEL PROBLEMS (unusual diarrhea, constipation, pain near the anus) TENDERNESS IN MOUTH AND THROAT WITH OR WITHOUT PRESENCE OF ULCERS (sore throat, sores in mouth, or a toothache) UNUSUAL RASH, SWELLING OR PAIN  UNUSUAL VAGINAL DISCHARGE OR ITCHING   Items with * indicate a potential emergency and should be followed up as soon as possible or go to the Emergency Department if any problems should occur.  Please show the CHEMOTHERAPY ALERT CARD or IMMUNOTHERAPY ALERT CARD at check-in  to the Emergency Department and triage nurse.  Should you have questions after your visit or need to cancel or reschedule your appointment, please contact [facility]  [phone] and follow the prompts.  Office hours are 8:00 a.m. to 4:30 p.m. Monday - Friday. Please note that voicemails left after 4:00 p.m. may not be returned until the following business day.  We are closed weekends and major holidays. You have access to a nurse at all times for urgent questions. Please call the main number to the clinic [phone] and follow the prompts.  For any non-urgent questions, you may also contact your provider using MyChart. We now offer e-Visits for anyone 18 and older to request care online for non-urgent symptoms. For details visit [email or web address].   Also download the MyChart app! Go to the app store, search "MyChart", open the app, select , and log in with your MyChart username and password.  Due to Covid, a mask is required upon entering the hospital/clinic. If you do not have a mask, one will be given to you upon arrival. For doctor visits, patients may have 1 support person aged 18 or older with them. For treatment visits, patients cannot have anyone with them due to current Covid guidelines and our immunocompromised population.  

## 2020-12-11 ENCOUNTER — Other Ambulatory Visit: Payer: Self-pay

## 2020-12-11 ENCOUNTER — Inpatient Hospital Stay: Payer: Self-pay

## 2020-12-11 DIAGNOSIS — C642 Malignant neoplasm of left kidney, except renal pelvis: Secondary | ICD-10-CM

## 2020-12-11 DIAGNOSIS — R7989 Other specified abnormal findings of blood chemistry: Secondary | ICD-10-CM

## 2020-12-11 LAB — CBC WITH DIFFERENTIAL/PLATELET
Abs Immature Granulocytes: 0.02 10*3/uL (ref 0.00–0.07)
Basophils Absolute: 0.1 10*3/uL (ref 0.0–0.1)
Basophils Relative: 1 %
Eosinophils Absolute: 0.3 10*3/uL (ref 0.0–0.5)
Eosinophils Relative: 4 %
HCT: 41 % (ref 39.0–52.0)
Hemoglobin: 12.9 g/dL — ABNORMAL LOW (ref 13.0–17.0)
Immature Granulocytes: 0 %
Lymphocytes Relative: 21 %
Lymphs Abs: 1.9 10*3/uL (ref 0.7–4.0)
MCH: 27.1 pg (ref 26.0–34.0)
MCHC: 31.5 g/dL (ref 30.0–36.0)
MCV: 86.1 fL (ref 80.0–100.0)
Monocytes Absolute: 1 10*3/uL (ref 0.1–1.0)
Monocytes Relative: 11 %
Neutro Abs: 5.5 10*3/uL (ref 1.7–7.7)
Neutrophils Relative %: 63 %
Platelets: 164 10*3/uL (ref 150–400)
RBC: 4.76 MIL/uL (ref 4.22–5.81)
RDW: 14.4 % (ref 11.5–15.5)
WBC: 8.8 10*3/uL (ref 4.0–10.5)
nRBC: 0 % (ref 0.0–0.2)

## 2020-12-11 LAB — COMPREHENSIVE METABOLIC PANEL
ALT: 55 U/L — ABNORMAL HIGH (ref 0–44)
AST: 65 U/L — ABNORMAL HIGH (ref 15–41)
Albumin: 4 g/dL (ref 3.5–5.0)
Alkaline Phosphatase: 99 U/L (ref 38–126)
Anion gap: 6 (ref 5–15)
BUN: 52 mg/dL — ABNORMAL HIGH (ref 6–20)
CO2: 25 mmol/L (ref 22–32)
Calcium: 8.7 mg/dL — ABNORMAL LOW (ref 8.9–10.3)
Chloride: 104 mmol/L (ref 98–111)
Creatinine, Ser: 2.4 mg/dL — ABNORMAL HIGH (ref 0.61–1.24)
GFR, Estimated: 31 mL/min — ABNORMAL LOW (ref >60)
Glucose, Bld: 119 mg/dL — ABNORMAL HIGH (ref 70–99)
Potassium: 5 mmol/L (ref 3.5–5.1)
Sodium: 135 mmol/L (ref 135–145)
Total Bilirubin: 0.5 mg/dL (ref 0.3–1.2)
Total Protein: 6.7 g/dL (ref 6.5–8.1)

## 2020-12-25 ENCOUNTER — Other Ambulatory Visit: Payer: Self-pay

## 2020-12-25 ENCOUNTER — Inpatient Hospital Stay (HOSPITAL_BASED_OUTPATIENT_CLINIC_OR_DEPARTMENT_OTHER): Payer: Self-pay | Admitting: Oncology

## 2020-12-25 ENCOUNTER — Inpatient Hospital Stay: Payer: Self-pay

## 2020-12-25 ENCOUNTER — Encounter: Payer: Self-pay | Admitting: Oncology

## 2020-12-25 ENCOUNTER — Inpatient Hospital Stay: Payer: Self-pay | Attending: Oncology

## 2020-12-25 VITALS — BP 118/83 | HR 63 | Temp 98.2°F | Resp 18 | Wt 160.3 lb

## 2020-12-25 DIAGNOSIS — Z5112 Encounter for antineoplastic immunotherapy: Secondary | ICD-10-CM

## 2020-12-25 DIAGNOSIS — M879 Osteonecrosis, unspecified: Secondary | ICD-10-CM | POA: Insufficient documentation

## 2020-12-25 DIAGNOSIS — I255 Ischemic cardiomyopathy: Secondary | ICD-10-CM | POA: Insufficient documentation

## 2020-12-25 DIAGNOSIS — R7989 Other specified abnormal findings of blood chemistry: Secondary | ICD-10-CM | POA: Insufficient documentation

## 2020-12-25 DIAGNOSIS — C642 Malignant neoplasm of left kidney, except renal pelvis: Secondary | ICD-10-CM

## 2020-12-25 DIAGNOSIS — I5042 Chronic combined systolic (congestive) and diastolic (congestive) heart failure: Secondary | ICD-10-CM | POA: Insufficient documentation

## 2020-12-25 DIAGNOSIS — N1832 Chronic kidney disease, stage 3b: Secondary | ICD-10-CM | POA: Insufficient documentation

## 2020-12-25 DIAGNOSIS — C778 Secondary and unspecified malignant neoplasm of lymph nodes of multiple regions: Secondary | ICD-10-CM | POA: Insufficient documentation

## 2020-12-25 DIAGNOSIS — Z79899 Other long term (current) drug therapy: Secondary | ICD-10-CM | POA: Insufficient documentation

## 2020-12-25 DIAGNOSIS — Z8679 Personal history of other diseases of the circulatory system: Secondary | ICD-10-CM

## 2020-12-25 DIAGNOSIS — I2510 Atherosclerotic heart disease of native coronary artery without angina pectoris: Secondary | ICD-10-CM | POA: Insufficient documentation

## 2020-12-25 DIAGNOSIS — B690 Cysticercosis of central nervous system: Secondary | ICD-10-CM

## 2020-12-25 DIAGNOSIS — Z905 Acquired absence of kidney: Secondary | ICD-10-CM | POA: Insufficient documentation

## 2020-12-25 DIAGNOSIS — R7401 Elevation of levels of liver transaminase levels: Secondary | ICD-10-CM | POA: Insufficient documentation

## 2020-12-25 DIAGNOSIS — I130 Hypertensive heart and chronic kidney disease with heart failure and stage 1 through stage 4 chronic kidney disease, or unspecified chronic kidney disease: Secondary | ICD-10-CM | POA: Insufficient documentation

## 2020-12-25 DIAGNOSIS — C7800 Secondary malignant neoplasm of unspecified lung: Secondary | ICD-10-CM | POA: Insufficient documentation

## 2020-12-25 DIAGNOSIS — Z7982 Long term (current) use of aspirin: Secondary | ICD-10-CM | POA: Insufficient documentation

## 2020-12-25 LAB — CBC WITH DIFFERENTIAL/PLATELET
Abs Immature Granulocytes: 0.02 10*3/uL (ref 0.00–0.07)
Basophils Absolute: 0.1 10*3/uL (ref 0.0–0.1)
Basophils Relative: 1 %
Eosinophils Absolute: 0.3 10*3/uL (ref 0.0–0.5)
Eosinophils Relative: 3 %
HCT: 40.6 % (ref 39.0–52.0)
Hemoglobin: 12.7 g/dL — ABNORMAL LOW (ref 13.0–17.0)
Immature Granulocytes: 0 %
Lymphocytes Relative: 18 %
Lymphs Abs: 1.7 10*3/uL (ref 0.7–4.0)
MCH: 27 pg (ref 26.0–34.0)
MCHC: 31.3 g/dL (ref 30.0–36.0)
MCV: 86.4 fL (ref 80.0–100.0)
Monocytes Absolute: 1.1 10*3/uL — ABNORMAL HIGH (ref 0.1–1.0)
Monocytes Relative: 11 %
Neutro Abs: 6.3 10*3/uL (ref 1.7–7.7)
Neutrophils Relative %: 67 %
Platelets: 177 10*3/uL (ref 150–400)
RBC: 4.7 MIL/uL (ref 4.22–5.81)
RDW: 14 % (ref 11.5–15.5)
WBC: 9.4 10*3/uL (ref 4.0–10.5)
nRBC: 0 % (ref 0.0–0.2)

## 2020-12-25 LAB — COMPREHENSIVE METABOLIC PANEL
ALT: 108 U/L — ABNORMAL HIGH (ref 0–44)
AST: 99 U/L — ABNORMAL HIGH (ref 15–41)
Albumin: 3.8 g/dL (ref 3.5–5.0)
Alkaline Phosphatase: 114 U/L (ref 38–126)
Anion gap: 7 (ref 5–15)
BUN: 41 mg/dL — ABNORMAL HIGH (ref 6–20)
CO2: 25 mmol/L (ref 22–32)
Calcium: 9.2 mg/dL (ref 8.9–10.3)
Chloride: 104 mmol/L (ref 98–111)
Creatinine, Ser: 2.22 mg/dL — ABNORMAL HIGH (ref 0.61–1.24)
GFR, Estimated: 34 mL/min — ABNORMAL LOW (ref >60)
Glucose, Bld: 107 mg/dL — ABNORMAL HIGH (ref 70–99)
Potassium: 4.8 mmol/L (ref 3.5–5.1)
Sodium: 136 mmol/L (ref 135–145)
Total Bilirubin: 0.3 mg/dL (ref 0.3–1.2)
Total Protein: 6.8 g/dL (ref 6.5–8.1)

## 2020-12-25 LAB — TSH: TSH: 3.089 u[IU]/mL (ref 0.350–4.500)

## 2020-12-25 LAB — T4, FREE: Free T4: 0.94 ng/dL (ref 0.61–1.12)

## 2020-12-25 NOTE — Progress Notes (Signed)
Pt here for follow up. States he has been doing well, no concerns

## 2020-12-25 NOTE — Progress Notes (Signed)
Hematology/Oncology Progress Note [phone] [phone] Patient Care Team: Center, [facility] as PCP - General (General Practice)   Name of the patient: Mark Donaldson  [id number]  1961/01/03  Date of visit: 12/25/20   INTERVAL HISTORY-  60 y.o. male presents for follow-up of renal cell carcinoma treatments. Patient has past medical history CAD status post PCI to LAD in 2018, on aspirin and Plavix, CHF, history of focal sclerosing/crescentic GN-ANCA positive and CKD stage III. He was hospitalized from 05/02/2019-05/07/2019 due to gross hematuria. CT showed a 7.4 cm left lower pole renal mass concerning for RCC.  Patient also has necrotic lymphadenopathy in the mediastinum and the right supra hilar region which were highly suspicious for metastatic disease.  Bilateral lung nodules, also concerning for metastatic lung disease.  Right retrocrural lymphadenopathy with upper normal left periaortic lymph node. Patient was recommended for cytoreductive radical nephrectomy.  With history of ANCA positive vasculitis, also recommend thoracic lymphadenopathy biopsy to confirm distal metastasis.  Patient agreed to radical nephrectomy and declined bronchoscopy biopsy. He underwent left radical nephrectomy on 05/05/2019. Pathology showed pT3a pNx, RCC, conventional clear cell type, grade 3 Patient was discharged home. Today he presents to discuss pathology and the management plan. He was accompanied by his wife and daughter. Patient reports that he uses hydrocodone for pain.  Pain around the surgical site has improved.  He occasionally has tingling sensation around the surgical site.  Denies any fever or chills, drainage from the surgical site.  # Interval CT chest abdomen pelvis was independently reviewed by me and discussed with patient. 01/12/2020, CT showed overall marked improvement.  Comparing to CT scan in August 2021, continues to have treatment  response.Mediastinal soft tissue nodule nearly completely resolved, right minor fissure nodule 8 mm, decreased in size.  Resolution of pleural fluid and thickening of the right chest.  Small pleural effusion in the left chest.  No new lung nodules.-Subtle variation of the muscle architecture of the left psoas muscle of unknown significance.-Continue observation.  He is asymptomatic. Subtle nodule in the soft tissue of the left flank.  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 He finished 14 days of albendazole plus praziquantel .   He has had few side effects including poor appetite, hair loss, weight loss, bloody stool, abdominal cramping or diarrhea. All symptoms have improved since he completed the treatment. 03/16/2020 he finished tapering course of steroids  # It has been noted that his glucose level has been elevated in December 2021, even before he was started on prednisone treatments. Dr. Steva Ready has obtained A1c which came back elevated at 8.7.  # 04/30/2020, CT chest abdomen pelvis without contrast showed stable disease.   #04/30/2020, axitinib and immunotherapy were held due to diarrhea and fatigue. #05/15/2020 resumed on immunotherapy. 07/31/2020, CT chest abdomen pelvis showed stable disease 09/11/2020 resumed axitinib 5 mg twice daily, stopped on 10/23/2020 due to diarrhea.   #Intermittent orthopnea, symptom has resolved.  Continue Lasix Pre-existing diastolic CHF, 4/82/5003 LVEF 60 to 65%.   Recommend him to follow-up with cardiology-he is has not able to follow-up with cardiology due to lack of insurance coverage.  INTERVAL HISTORY Mark Donaldson is a 60 y.o. male who has above history reviewed by me today presents for follow up visit for management of metastatic RCC Problems and complaints are listed below: Patient is Spanish-speaking.   Online Spanish interpreter service was utilized for the entire encounter for translation Patient reports feeling well.   Denies  any nausea vomiting diarrhea skin rash.   Review of systems- Review of Systems  Constitutional:  Negative for appetite change, chills, fatigue, fever and unexpected weight change.  HENT:   Negative for voice change.   Eyes:  Negative for eye problems and icterus.  Respiratory:  Negative for chest tightness and cough.   Cardiovascular:  Negative for chest pain and leg swelling.  Gastrointestinal:  Negative for abdominal distention, abdominal pain and diarrhea.  Endocrine: Negative for hot flashes.  Genitourinary:  Negative for difficulty urinating, dysuria and frequency.   Musculoskeletal:  Positive for arthralgias.  Skin:  Negative for itching and rash.  Neurological:  Negative for light-headedness and numbness.  Hematological:  Negative for adenopathy. Does not bruise/bleed easily.  Psychiatric/Behavioral:  Negative for confusion.    No Known Allergies  Patient Active Problem List   Diagnosis Date Noted   History of CHF (congestive heart failure) 05/01/2020   Hyperglycemia 02/28/2020   Stage 3b chronic kidney disease ([place]) 10/04/2019   Brain lesion 10/04/2019   Encounter for antineoplastic immunotherapy 07/19/2019   Encounter for antineoplastic chemotherapy 07/19/2019   CNS lesion 07/07/2019   Anemia in stage 3b chronic kidney disease ([place]) 06/07/2019   Goals of care, counseling/discussion 05/23/2019   Renal cell carcinoma of left kidney ([place]) 05/23/2019   Renal cell carcinoma ([place]) 05/23/2019   Palliative care encounter    History of vasculitis    Thoracic lymphadenopathy    Lung nodule    CAD S/P percutaneous coronary angioplasty 05/02/2019   Kidney mass 05/02/2019   Hydronephrosis of left kidney 05/02/2019   UTI (urinary tract infection) 05/02/2019   AKI (acute kidney injury) ([place]) 05/02/2019   Gross hematuria 05/02/2019   Chronic diastolic CHF (congestive heart failure) ([place]) 05/02/2019   Sepsis ([place]) 05/02/2019   Severe sepsis (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 ([place]) 05/21/2016   Prediabetes 05/21/2016   Alcohol abuse 05/18/2016   Essential hypertension 05/18/2016   Vasculitis, ANCA positive 05/18/2016   Shortness of breath 05/17/2016     Past Medical History:  Diagnosis Date   ANCA-positive vasculitis    CAD (coronary artery disease)    Essential hypertension    HFrEF (heart failure with reduced ejection fraction) (HCC)    Hyperlipidemia LDL goal <70    Ischemic cardiomyopathy    Renal cell carcinoma ([place]) 05/23/2019   Renal disorder      Past Surgical History:  Procedure Laterality Date   CARDIAC SURGERY     cardiac cath with 2 stent placement   LAPAROSCOPIC NEPHRECTOMY, HAND ASSISTED Left 05/05/2019   Procedure: HAND ASSISTED LAPAROSCOPIC NEPHRECTOMY;  Surgeon: Hollice Espy, MD;  Location: [facility] ORS;  Service: Urology;  Laterality: Left;    Social History   Socioeconomic History   Marital status: Married    Spouse name: Not on file   Number of children: Not on file   Years of education: Not on file   Highest education level: Not on file  Occupational History   Not on file  Tobacco Use   Smoking status: Never   Smokeless tobacco: Never  Substance and Sexual Activity   Alcohol use: Yes   Drug use: Not on file   Sexual activity: Not on file  Other Topics Concern   Not on file  Social History Narrative   Not on file   Social Determinants of Health   Financial Resource Strain: Not on file  Food Insecurity: Not on file  Transportation Needs: Not on file  Physical Activity: Not on file  Stress: Not on file  Social Connections: Not on file  Intimate Partner Violence: Not on file     Family History  Problem Relation Age of Onset   Cancer Mother    Blindness Brother      Current Outpatient Medications:    aspirin 81 MG EC tablet, Take 81 mg by mouth daily. Swallow whole., Disp: , Rfl:    atorvastatin  (LIPITOR) 80 MG tablet, Take 80 mg by mouth daily., Disp: , Rfl:    clopidogrel (PLAVIX) 75 MG tablet, Take 75 mg by mouth daily., Disp: , Rfl:    furosemide (LASIX) 20 MG tablet, Take 1 tablet (20 mg total) by mouth daily., Disp: 30 tablet, Rfl: 3   KEYTRUDA 100 MG/4ML SOLN, INJECT 200MG  INTRAVENOUSLY EVERY 3 WEEKS, Disp: 8 mL, Rfl: 11   lisinopril (ZESTRIL) 10 MG tablet, Take 10 mg by mouth daily., Disp: , Rfl:    axitinib (INLYTA) 5 MG tablet, Take 1 tablet (5 mg total) by mouth 2 (two) times daily. (Patient not taking: Reported on 11/13/2020), Disp: 60 tablet, Rfl: 1   loperamide (IMODIUM) 2 MG capsule, Take 1 capsule (2 mg total) by mouth See admin instructions. Take 2 tablets with onset of diarrhea, then 1 tablet for every 2 hours until diarrhea stops. Maximum 8 tablets per 24 hours. (Patient not taking: Reported on 11/13/2020), Disp: 60 capsule, Rfl: 2   metoprolol succinate (TOPROL-XL) 100 MG 24 hr tablet, Take 1.5 tablets by mouth daily., Disp: , Rfl:    Physical exam:  Vitals:   12/25/20 0844  BP: 118/83  Pulse: 63  Resp: 18  Temp: 98.2 F (36.8 C)  Weight: 160 lb 4.8 oz (72.7 kg)   Physical Exam Constitutional:      General: He is not in acute distress.    Appearance: He is not diaphoretic.  HENT:     Head: Normocephalic and atraumatic.     Nose: Nose normal.     Mouth/Throat:     Pharynx: No oropharyngeal exudate.  Eyes:     General: No scleral icterus.    Pupils: Pupils are equal, round, and reactive to light.  Cardiovascular:     Rate and Rhythm: Normal rate and regular rhythm.     Heart sounds: No murmur heard. Pulmonary:     Effort: Pulmonary effort is normal. No respiratory distress.     Breath sounds: No rales.  Chest:     Chest wall: No tenderness.  Abdominal:     General: There is no distension.     Palpations: Abdomen is soft.     Tenderness: There is no abdominal tenderness.  Musculoskeletal:        General: Normal range of motion.     Cervical back:  Normal range of motion and neck supple.     Comments: Right shoulder range of motion limited due to pain.  Skin:    General: Skin is warm and dry.     Findings: No erythema.  Neurological:     Mental Status: He is alert and oriented to person, place, and time.     Cranial Nerves: No cranial nerve deficit.     Motor: No abnormal muscle tone.     Coordination: Coordination normal.  Psychiatric:        Mood and Affect: Affect normal.       CMP Latest Ref Rng & Units 12/25/2020  Glucose 70 - 99 mg/dL 107(H)  BUN 6 - 20 mg/dL 41(H)  Creatinine 0.61 - 1.24 mg/dL 2.22(H)  Sodium 135 - 145 mmol/L 136  Potassium 3.5 - 5.1 mmol/L 4.8  Chloride 98 - 111 mmol/L 104  CO2 22 - 32 mmol/L 25  Calcium 8.9 - 10.3 mg/dL 9.2  Total Protein 6.5 - 8.1 g/dL 6.8  Total Bilirubin 0.3 - 1.2 mg/dL 0.3  Alkaline Phos 38 - 126 U/L 114  AST 15 - 41 U/L 99(H)  ALT 0 - 44 U/L 108(H)   CBC Latest Ref Rng & Units 12/25/2020  WBC 4.0 - 10.5 K/uL 9.4  Hemoglobin 13.0 - 17.0 g/dL 12.7(L)  Hematocrit 39.0 - 52.0 % 40.6  Platelets 150 - 400 K/uL 177    RADIOGRAPHIC STUDIES: I have personally reviewed the radiological images as listed and agreed with the findings in the report. DG Shoulder Right  Result Date: 10/26/2020 CLINICAL DATA:  Right shoulder pain. EXAM: RIGHT SHOULDER - 2+ VIEW COMPARISON:  None. FINDINGS: Normal alignment and joint spaces. There is no evidence of fracture or dislocation. There is no evidence of arthropathy or other focal bone abnormality. The included right hemithorax is unremarkable. Soft tissues are unremarkable. IMPRESSION: Negative radiographs of the right shoulder. Electronically Signed   By: Keith Rake M.D.   On: 10/26/2020 18:26   CT CHEST ABDOMEN PELVIS WO CONTRAST  Result Date: 11/28/2020 CLINICAL DATA:  History of metastatic RCC status post left nephrectomy without ongoing chemotherapy EXAM: CT CHEST, ABDOMEN AND PELVIS WITHOUT CONTRAST TECHNIQUE: Multidetector CT  imaging of the chest, abdomen and pelvis was performed following the standard protocol without IV contrast. COMPARISON:  Multiple priors including most recent CT July 31, 2020 FINDINGS: CT CHEST FINDINGS Cardiovascular: No thoracic aortic aneurysm. Normal size heart. Normal caliber central pulmonary arteries. No significant pericardial effusion/thickening. Three-vessel coronary artery calcifications. Mediastinum/Nodes: No supraclavicular adenopathy. No discrete thyroid nodularity. No pathologically enlarged mediastinal, hilar or axillary lymph nodes within the limitation of noncontrast examination. Stable paramediastinal soft tissue nodularity for instance on image 21/3 measuring 4 mm. No new or enlarging lesions. The trachea and esophagus are grossly unremarkable. Lungs/Pleura: Stable to minimally decreased size of the pulmonary nodule along the minor fissure in the right chest now measuring 5 mm on image 79/4 previously 6 mm. No new suspicious pulmonary nodules or masses. Mild centrilobular emphysema. Diffuse bronchial wall thickening. Near complete resolution of the previously seen diffuse ground-glass micro nodularity. Stable partially loculated fluid along the left major fissure. No pneumothorax. Musculoskeletal: No aggressive lytic or blastic lesion of bone. CT ABDOMEN PELVIS FINDINGS Hepatobiliary: Unremarkable noncontrast appearance of the hepatic parenchyma. Gallbladder is unremarkable. No biliary ductal dilation. Pancreas: No pancreatic ductal dilation or evidence of acute inflammation. Spleen: Within normal limits. Adrenals/Urinary Tract: Bilateral adrenal glands appear normal. Stable postsurgical changes of left nephrectomy without new suspicious soft tissue nodularity visualized in the nephrectomy bed. Right kidney is unremarkable without hydronephrosis or contour deforming renal mass. Mild symmetric wall thickening of an incompletely distended urinary bladder, similar prior. Stomach/Bowel: Radiopaque  enteric contrast material traverses the rectum. Stomach is unremarkable for degree of distension. No pathologic dilation of small or large bowel. The appendix and terminal ileum appear normal. No evidence of acute bowel inflammation. Sigmoid colonic diverticulosis without findings of acute diverticulitis. Vascular/Lymphatic: No abdominal aortic aneurysm. No pathologically enlarged abdominal or pelvic lymph nodes. Reproductive: Mild enlargement of prostate gland. Other: No significant abdominopelvic free fluid. Musculoskeletal: Multilevel degenerative changes spine. Similar avascular necrosis of  the bilateral femoral heads. No aggressive lytic or blastic lesion of bone. IMPRESSION: 1. Overall stable examination status postsurgical left nephrectomy without evidence of new or progressive disease in the chest abdomen or pelvis. 2. Stable to minimally decreased size of the 5 mm pulmonary nodule along the minor fissure in the right chest. No new suspicious pulmonary nodules or masses. 3. Sigmoid colonic diverticulosis without findings of acute diverticulitis. 4. Avascular necrosis of the bilateral femoral heads. 5. Aortic Atherosclerosis (ICD10-I70.0) and Emphysema (ICD10-J43.9). Electronically Signed   By: Dahlia Bailiff M.D.   On: 11/28/2020 15:46     Assessment and plan-  Patient is a 60 y.o. male history of CAD, status post stent, CHF, history of ANCA positive sclerosing/crescentic GN, chronic kidney disease stage III presents for treatments of stage IV renal cell carcinoma 1. Renal cell carcinoma of left kidney (HCC)   2. Encounter for antineoplastic immunotherapy   3. Elevated LFTs   4. History of CHF (congestive heart failure)   5. Neurocysticercosis    Cancer Staging  Renal cell carcinoma of left kidney ([place]) Staging form: Kidney, AJCC 8th Edition - Pathologic: Stage IV (pT3a, pNX, cM1) - Signed by Earlie Server, MD on 05/23/2019   # Stage IV renal cell carcinoma Labs reviewed and discussed with  patient. Transaminitis, possible immunotherapy side effects.  Hold Keytruda today. 11/28/2020, CT abdomen pelvis without contrast showed stable examination.  No evidence of new or progressive disease in the chest abdomen pelvis.  Stable to minimally decreased size of 5 mm pulmonary nodule in the minor fissure in the right chest.  Sigmoid colon diverticulosis without findings of acute diverticulitis.  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  Discussed with patient and recommend patient to further discuss with primary care provider.  He is currently asymptomatic.  #Hypertension, CHF,  Continue metoprolol, lisinopril.  Lasix Stable.   #Brain lesion-  neurocysticercosis finished therapy.  Follow-up with ID There is a new lesion, Dr.Ravishanka plans to consult expert and let patient know.  #CKD, avoid nephrotoxins. Encourage oral hydration.   Online Spanish interpreter presented for the entire encounter for translation. We spent sufficient time to discuss many aspect of care, questions were answered to patient's satisfaction. Follow-up in 3 weeks for follow-up of evaluation prior to immunotherapy.  Earlie Server, MD, PhD  12/25/2020

## 2020-12-30 ENCOUNTER — Other Ambulatory Visit: Payer: Self-pay

## 2021-01-01 ENCOUNTER — Ambulatory Visit: Payer: Self-pay | Admitting: Orthopaedic Surgery

## 2021-01-09 ENCOUNTER — Other Ambulatory Visit: Payer: Self-pay | Admitting: *Deleted

## 2021-01-09 DIAGNOSIS — C642 Malignant neoplasm of left kidney, except renal pelvis: Secondary | ICD-10-CM

## 2021-01-17 ENCOUNTER — Encounter: Payer: Self-pay | Admitting: Oncology

## 2021-01-17 ENCOUNTER — Other Ambulatory Visit: Payer: Self-pay

## 2021-01-17 ENCOUNTER — Inpatient Hospital Stay: Payer: Self-pay

## 2021-01-17 ENCOUNTER — Inpatient Hospital Stay (HOSPITAL_BASED_OUTPATIENT_CLINIC_OR_DEPARTMENT_OTHER): Payer: Self-pay | Admitting: Oncology

## 2021-01-17 ENCOUNTER — Inpatient Hospital Stay: Payer: Self-pay | Attending: Oncology

## 2021-01-17 VITALS — BP 128/83 | HR 66 | Temp 96.4°F | Resp 18 | Wt 162.4 lb

## 2021-01-17 DIAGNOSIS — M879 Osteonecrosis, unspecified: Secondary | ICD-10-CM | POA: Insufficient documentation

## 2021-01-17 DIAGNOSIS — F1011 Alcohol abuse, in remission: Secondary | ICD-10-CM | POA: Insufficient documentation

## 2021-01-17 DIAGNOSIS — I509 Heart failure, unspecified: Secondary | ICD-10-CM | POA: Insufficient documentation

## 2021-01-17 DIAGNOSIS — N183 Chronic kidney disease, stage 3 unspecified: Secondary | ICD-10-CM | POA: Insufficient documentation

## 2021-01-17 DIAGNOSIS — R7989 Other specified abnormal findings of blood chemistry: Secondary | ICD-10-CM

## 2021-01-17 DIAGNOSIS — Z8679 Personal history of other diseases of the circulatory system: Secondary | ICD-10-CM

## 2021-01-17 DIAGNOSIS — Z7982 Long term (current) use of aspirin: Secondary | ICD-10-CM | POA: Insufficient documentation

## 2021-01-17 DIAGNOSIS — N1832 Chronic kidney disease, stage 3b: Secondary | ICD-10-CM

## 2021-01-17 DIAGNOSIS — Z955 Presence of coronary angioplasty implant and graft: Secondary | ICD-10-CM | POA: Insufficient documentation

## 2021-01-17 DIAGNOSIS — R7401 Elevation of levels of liver transaminase levels: Secondary | ICD-10-CM | POA: Insufficient documentation

## 2021-01-17 DIAGNOSIS — Z5112 Encounter for antineoplastic immunotherapy: Secondary | ICD-10-CM

## 2021-01-17 DIAGNOSIS — D631 Anemia in chronic kidney disease: Secondary | ICD-10-CM | POA: Insufficient documentation

## 2021-01-17 DIAGNOSIS — Z7901 Long term (current) use of anticoagulants: Secondary | ICD-10-CM | POA: Insufficient documentation

## 2021-01-17 DIAGNOSIS — Z79899 Other long term (current) drug therapy: Secondary | ICD-10-CM | POA: Insufficient documentation

## 2021-01-17 DIAGNOSIS — I5032 Chronic diastolic (congestive) heart failure: Secondary | ICD-10-CM | POA: Insufficient documentation

## 2021-01-17 DIAGNOSIS — I129 Hypertensive chronic kidney disease with stage 1 through stage 4 chronic kidney disease, or unspecified chronic kidney disease: Secondary | ICD-10-CM | POA: Insufficient documentation

## 2021-01-17 DIAGNOSIS — C642 Malignant neoplasm of left kidney, except renal pelvis: Secondary | ICD-10-CM | POA: Insufficient documentation

## 2021-01-17 DIAGNOSIS — B690 Cysticercosis of central nervous system: Secondary | ICD-10-CM

## 2021-01-17 DIAGNOSIS — I255 Ischemic cardiomyopathy: Secondary | ICD-10-CM | POA: Insufficient documentation

## 2021-01-17 DIAGNOSIS — I2510 Atherosclerotic heart disease of native coronary artery without angina pectoris: Secondary | ICD-10-CM | POA: Insufficient documentation

## 2021-01-17 DIAGNOSIS — Z905 Acquired absence of kidney: Secondary | ICD-10-CM | POA: Insufficient documentation

## 2021-01-17 LAB — CBC WITH DIFFERENTIAL/PLATELET
Abs Immature Granulocytes: 0.02 10*3/uL (ref 0.00–0.07)
Basophils Absolute: 0.1 10*3/uL (ref 0.0–0.1)
Basophils Relative: 1 %
Eosinophils Absolute: 0.3 10*3/uL (ref 0.0–0.5)
Eosinophils Relative: 4 %
HCT: 41.2 % (ref 39.0–52.0)
Hemoglobin: 12.9 g/dL — ABNORMAL LOW (ref 13.0–17.0)
Immature Granulocytes: 0 %
Lymphocytes Relative: 21 %
Lymphs Abs: 1.9 10*3/uL (ref 0.7–4.0)
MCH: 26.8 pg (ref 26.0–34.0)
MCHC: 31.3 g/dL (ref 30.0–36.0)
MCV: 85.7 fL (ref 80.0–100.0)
Monocytes Absolute: 1 10*3/uL (ref 0.1–1.0)
Monocytes Relative: 11 %
Neutro Abs: 5.6 10*3/uL (ref 1.7–7.7)
Neutrophils Relative %: 63 %
Platelets: 167 10*3/uL (ref 150–400)
RBC: 4.81 MIL/uL (ref 4.22–5.81)
RDW: 14.1 % (ref 11.5–15.5)
WBC: 8.9 10*3/uL (ref 4.0–10.5)
nRBC: 0 % (ref 0.0–0.2)

## 2021-01-17 LAB — COMPREHENSIVE METABOLIC PANEL
ALT: 165 U/L — ABNORMAL HIGH (ref 0–44)
AST: 134 U/L — ABNORMAL HIGH (ref 15–41)
Albumin: 4 g/dL (ref 3.5–5.0)
Alkaline Phosphatase: 166 U/L — ABNORMAL HIGH (ref 38–126)
Anion gap: 9 (ref 5–15)
BUN: 49 mg/dL — ABNORMAL HIGH (ref 6–20)
CO2: 24 mmol/L (ref 22–32)
Calcium: 9.3 mg/dL (ref 8.9–10.3)
Chloride: 104 mmol/L (ref 98–111)
Creatinine, Ser: 2.17 mg/dL — ABNORMAL HIGH (ref 0.61–1.24)
GFR, Estimated: 34 mL/min — ABNORMAL LOW (ref >60)
Glucose, Bld: 132 mg/dL — ABNORMAL HIGH (ref 70–99)
Potassium: 5.1 mmol/L (ref 3.5–5.1)
Sodium: 137 mmol/L (ref 135–145)
Total Bilirubin: 0.4 mg/dL (ref 0.3–1.2)
Total Protein: 6.8 g/dL (ref 6.5–8.1)

## 2021-01-17 LAB — HEPATITIS PANEL, ACUTE
HCV Ab: NONREACTIVE
Hep A IgM: NONREACTIVE
Hep B C IgM: NONREACTIVE
Hepatitis B Surface Ag: NONREACTIVE

## 2021-01-17 NOTE — Progress Notes (Signed)
Pt here for follow up. Reports that he was to told to reach out to PCP for referral to see a doctor for his "joints". He reached out but was being sent to [facility] and he does not drive all the way there.

## 2021-01-17 NOTE — Progress Notes (Signed)
Hematology/Oncology Progress Note [phone] [phone] Patient Care Team: Center, [facility] as PCP - General (General Practice)   Name of the patient: Mark Donaldson  [id number]  11/25/1960  Date of visit: 01/17/21   INTERVAL HISTORY-  61 y.o. male presents for follow-up of renal cell carcinoma treatments. Patient has past medical history CAD status post PCI to LAD in 2018, on aspirin and Plavix, CHF, history of focal sclerosing/crescentic GN-ANCA positive and CKD stage III. He was hospitalized from 05/02/2019-05/07/2019 due to gross hematuria. CT showed a 7.4 cm left lower pole renal mass concerning for RCC.  Patient also has necrotic lymphadenopathy in the mediastinum and the right supra hilar region which were highly suspicious for metastatic disease.  Bilateral lung nodules, also concerning for metastatic lung disease.  Right retrocrural lymphadenopathy with upper normal left periaortic lymph node. Patient was recommended for cytoreductive radical nephrectomy.  With history of ANCA positive vasculitis, also recommend thoracic lymphadenopathy biopsy to confirm distal metastasis.  Patient agreed to radical nephrectomy and declined bronchoscopy biopsy. He underwent left radical nephrectomy on 05/05/2019. Pathology showed pT3a pNx, RCC, conventional clear cell type, grade 3 Patient was discharged home. Today he presents to discuss pathology and the management plan. He was accompanied by his wife and daughter. Patient reports that he uses hydrocodone for pain.  Pain around the surgical site has improved.  He occasionally has tingling sensation around the surgical site.  Denies any fever or chills, drainage from the surgical site.  # Interval CT chest abdomen pelvis was independently reviewed by me and discussed with patient. 01/12/2020, CT showed overall marked improvement.  Comparing to CT scan in August 2021, continues to have treatment  response.Mediastinal soft tissue nodule nearly completely resolved, right minor fissure nodule 8 mm, decreased in size.  Resolution of pleural fluid and thickening of the right chest.  Small pleural effusion in the left chest.  No new lung nodules.-Subtle variation of the muscle architecture of the left psoas muscle of unknown significance.-Continue observation.  He is asymptomatic. Subtle nodule in the soft tissue of the left flank.  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 He finished 14 days of albendazole plus praziquantel .   He has had few side effects including poor appetite, hair loss, weight loss, bloody stool, abdominal cramping or diarrhea. All symptoms have improved since he completed the treatment. 03/16/2020 he finished tapering course of steroids  # It has been noted that his glucose level has been elevated in December 2021, even before he was started on prednisone treatments. Dr. Steva Ready has obtained A1c which came back elevated at 8.7.  # 04/30/2020, CT chest abdomen pelvis without contrast showed stable disease.   #04/30/2020, axitinib and immunotherapy were held due to diarrhea and fatigue. #05/15/2020 resumed on immunotherapy. 07/31/2020, CT chest abdomen pelvis showed stable disease 09/11/2020 resumed axitinib 5 mg twice daily, stopped on 10/23/2020 due to diarrhea.   #Intermittent orthopnea, symptom has resolved.  Continue Lasix Pre-existing diastolic CHF, 0/26/3785 LVEF 60 to 65%.   Recommend him to follow-up with cardiology-he is has not able to follow-up with cardiology due to lack of insurance coverage.  11/28/2020, CT abdomen pelvis without contrast showed stable examination.  No evidence of new or progressive disease in the chest abdomen pelvis.  Stable to minimally decreased size of 5 mm pulmonary nodule in the minor fissure in the right chest.  Sigmoid colon diverticulosis without findings of acute diverticulitis.  INTERVAL HISTORY Mark Donaldson is  a 61 y.o. male who has above history reviewed by me today presents for follow up visit for management of metastatic RCC Problems and complaints are listed below: Patient is Spanish-speaking.   Spanish interpreter service was utilized for the entire encounter for translation Patient reports feeling well.No new complaints. Primary care provider has referred patient to establish care with orthopedic surgeon in [place].  Patient feels that he is not from any about that area is not able to go to his appointment.   Review of systems- Review of Systems  Constitutional:  Negative for appetite change, chills, fatigue, fever and unexpected weight change.  HENT:   Negative for voice change.   Eyes:  Negative for eye problems and icterus.  Respiratory:  Negative for chest tightness and cough.   Cardiovascular:  Negative for chest pain and leg swelling.  Gastrointestinal:  Negative for abdominal distention, abdominal pain and diarrhea.  Endocrine: Negative for hot flashes.  Genitourinary:  Negative for difficulty urinating, dysuria and frequency.   Musculoskeletal:  Positive for arthralgias.  Skin:  Negative for itching and rash.  Neurological:  Negative for light-headedness and numbness.  Hematological:  Negative for adenopathy. Does not bruise/bleed easily.  Psychiatric/Behavioral:  Negative for confusion.    No Known Allergies  Patient Active Problem List   Diagnosis Date Noted   History of CHF (congestive heart failure) 05/01/2020   Hyperglycemia 02/28/2020   Stage 3b chronic kidney disease ([place]) 10/04/2019   Brain lesion 10/04/2019   Encounter for antineoplastic immunotherapy 07/19/2019   Encounter for antineoplastic chemotherapy 07/19/2019   CNS lesion 07/07/2019   Anemia in stage 3b chronic kidney disease ([place]) 06/07/2019   Goals of care, counseling/discussion 05/23/2019   Renal cell carcinoma of left kidney ([place]) 05/23/2019   Renal cell carcinoma ([place]) 05/23/2019    Palliative care encounter    History of vasculitis    Thoracic lymphadenopathy    Lung nodule    CAD S/P percutaneous coronary angioplasty 05/02/2019   Kidney mass 05/02/2019   Hydronephrosis of left kidney 05/02/2019   UTI (urinary tract infection) 05/02/2019   AKI (acute kidney injury) ([place]) 05/02/2019   Gross hematuria 05/02/2019   Chronic diastolic CHF (congestive heart failure) ([place]) 05/02/2019   Sepsis ([place]) 05/02/2019   Severe sepsis ([place]) 05/02/2019   Preop cardiovascular exam    Hyperlipidemia 05/21/2016   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 ([place]) 05/21/2016   Prediabetes 05/21/2016   Alcohol abuse 05/18/2016   Essential hypertension 05/18/2016   Vasculitis, ANCA positive 05/18/2016   Shortness of breath 05/17/2016     Past Medical History:  Diagnosis Date   ANCA-positive vasculitis    CAD (coronary artery disease)    Essential hypertension    HFrEF (heart failure with reduced ejection fraction) (HCC)    Hyperlipidemia LDL goal <70    Ischemic cardiomyopathy    Renal cell carcinoma ([place]) 05/23/2019   Renal disorder      Past Surgical History:  Procedure Laterality Date   CARDIAC SURGERY     cardiac cath with 2 stent placement   LAPAROSCOPIC NEPHRECTOMY, HAND ASSISTED Left 05/05/2019   Procedure: HAND ASSISTED LAPAROSCOPIC NEPHRECTOMY;  Surgeon: Hollice Espy, MD;  Location: [facility] ORS;  Service: Urology;  Laterality: Left;    Social History   Socioeconomic History   Marital status: Married    Spouse name: Not on file   Number of children: Not on file   Years of education: Not on file   Highest education level: Not on  file  Occupational History   Not on file  Tobacco Use   Smoking status: Never   Smokeless tobacco: Never  Substance and Sexual Activity   Alcohol use: Yes   Drug use: Not on file   Sexual activity: Not on file  Other Topics Concern   Not on file  Social History Narrative   Not on file   Social Determinants of Health    Financial Resource Strain: Not on file  Food Insecurity: Not on file  Transportation Needs: Not on file  Physical Activity: Not on file  Stress: Not on file  Social Connections: Not on file  Intimate Partner Violence: Not on file     Family History  Problem Relation Age of Onset   Cancer Mother    Blindness Brother      Current Outpatient Medications:    aspirin 81 MG EC tablet, Take 81 mg by mouth daily. Swallow whole., Disp: , Rfl:    atorvastatin (LIPITOR) 80 MG tablet, Take 80 mg by mouth daily., Disp: , Rfl:    clopidogrel (PLAVIX) 75 MG tablet, Take 75 mg by mouth daily., Disp: , Rfl:    furosemide (LASIX) 20 MG tablet, Take 1 tablet (20 mg total) by mouth daily., Disp: 30 tablet, Rfl: 3   KEYTRUDA 100 MG/4ML SOLN, INJECT 200MG  INTRAVENOUSLY EVERY 3 WEEKS, Disp: 8 mL, Rfl: 11   lisinopril (ZESTRIL) 10 MG tablet, Take 10 mg by mouth daily., Disp: , Rfl:    metoprolol succinate (TOPROL-XL) 100 MG 24 hr tablet, Take 1.5 tablets by mouth daily., Disp: , Rfl:    axitinib (INLYTA) 5 MG tablet, Take 1 tablet (5 mg total) by mouth 2 (two) times daily. (Patient not taking: Reported on 11/13/2020), Disp: 60 tablet, Rfl: 1   loperamide (IMODIUM) 2 MG capsule, Take 1 capsule (2 mg total) by mouth See admin instructions. Take 2 tablets with onset of diarrhea, then 1 tablet for every 2 hours until diarrhea stops. Maximum 8 tablets per 24 hours. (Patient not taking: Reported on 11/13/2020), Disp: 60 capsule, Rfl: 2   Physical exam:  Vitals:   01/17/21 0924  BP: 128/83  Pulse: 66  Resp: 18  Temp: (!) 96.4 F (35.8 C)  Weight: 162 lb 6.4 oz (73.7 kg)   Physical Exam Constitutional:      General: He is not in acute distress.    Appearance: He is not diaphoretic.  HENT:     Head: Normocephalic and atraumatic.     Nose: Nose normal.     Mouth/Throat:     Pharynx: No oropharyngeal exudate.  Eyes:     General: No scleral icterus.    Pupils: Pupils are equal, round, and reactive to  light.  Cardiovascular:     Rate and Rhythm: Normal rate and regular rhythm.     Heart sounds: No murmur heard. Pulmonary:     Effort: Pulmonary effort is normal. No respiratory distress.     Breath sounds: No rales.  Chest:     Chest wall: No tenderness.  Abdominal:     General: There is no distension.     Palpations: Abdomen is soft.     Tenderness: There is no abdominal tenderness.  Musculoskeletal:        General: Normal range of motion.     Cervical back: Normal range of motion and neck supple.     Comments: Right shoulder range of motion limited due to pain.  Skin:    General: Skin is warm  and dry.     Findings: No erythema.  Neurological:     Mental Status: He is alert and oriented to person, place, and time.     Cranial Nerves: No cranial nerve deficit.     Motor: No abnormal muscle tone.     Coordination: Coordination normal.  Psychiatric:        Mood and Affect: Affect normal.       CMP Latest Ref Rng & Units 01/17/2021  Glucose 70 - 99 mg/dL 132(H)  BUN 6 - 20 mg/dL 49(H)  Creatinine 0.61 - 1.24 mg/dL 2.17(H)  Sodium 135 - 145 mmol/L 137  Potassium 3.5 - 5.1 mmol/L 5.1  Chloride 98 - 111 mmol/L 104  CO2 22 - 32 mmol/L 24  Calcium 8.9 - 10.3 mg/dL 9.3  Total Protein 6.5 - 8.1 g/dL 6.8  Total Bilirubin 0.3 - 1.2 mg/dL 0.4  Alkaline Phos 38 - 126 U/L 166(H)  AST 15 - 41 U/L 134(H)  ALT 0 - 44 U/L 165(H)   CBC Latest Ref Rng & Units 01/17/2021  WBC 4.0 - 10.5 K/uL 8.9  Hemoglobin 13.0 - 17.0 g/dL 12.9(L)  Hematocrit 39.0 - 52.0 % 41.2  Platelets 150 - 400 K/uL 167    RADIOGRAPHIC STUDIES: I have personally reviewed the radiological images as listed and agreed with the findings in the report. DG Shoulder Right  Result Date: 10/26/2020 CLINICAL DATA:  Right shoulder pain. EXAM: RIGHT SHOULDER - 2+ VIEW COMPARISON:  None. FINDINGS: Normal alignment and joint spaces. There is no evidence of fracture or dislocation. There is no evidence of arthropathy or other  focal bone abnormality. The included right hemithorax is unremarkable. Soft tissues are unremarkable. IMPRESSION: Negative radiographs of the right shoulder. Electronically Signed   By: Keith Rake M.D.   On: 10/26/2020 18:26   CT CHEST ABDOMEN PELVIS WO CONTRAST  Result Date: 11/28/2020 CLINICAL DATA:  History of metastatic RCC status post left nephrectomy without ongoing chemotherapy EXAM: CT CHEST, ABDOMEN AND PELVIS WITHOUT CONTRAST TECHNIQUE: Multidetector CT imaging of the chest, abdomen and pelvis was performed following the standard protocol without IV contrast. COMPARISON:  Multiple priors including most recent CT July 31, 2020 FINDINGS: CT CHEST FINDINGS Cardiovascular: No thoracic aortic aneurysm. Normal size heart. Normal caliber central pulmonary arteries. No significant pericardial effusion/thickening. Three-vessel coronary artery calcifications. Mediastinum/Nodes: No supraclavicular adenopathy. No discrete thyroid nodularity. No pathologically enlarged mediastinal, hilar or axillary lymph nodes within the limitation of noncontrast examination. Stable paramediastinal soft tissue nodularity for instance on image 21/3 measuring 4 mm. No new or enlarging lesions. The trachea and esophagus are grossly unremarkable. Lungs/Pleura: Stable to minimally decreased size of the pulmonary nodule along the minor fissure in the right chest now measuring 5 mm on image 79/4 previously 6 mm. No new suspicious pulmonary nodules or masses. Mild centrilobular emphysema. Diffuse bronchial wall thickening. Near complete resolution of the previously seen diffuse ground-glass micro nodularity. Stable partially loculated fluid along the left major fissure. No pneumothorax. Musculoskeletal: No aggressive lytic or blastic lesion of bone. CT ABDOMEN PELVIS FINDINGS Hepatobiliary: Unremarkable noncontrast appearance of the hepatic parenchyma. Gallbladder is unremarkable. No biliary ductal dilation. Pancreas: No pancreatic  ductal dilation or evidence of acute inflammation. Spleen: Within normal limits. Adrenals/Urinary Tract: Bilateral adrenal glands appear normal. Stable postsurgical changes of left nephrectomy without new suspicious soft tissue nodularity visualized in the nephrectomy bed. Right kidney is unremarkable without hydronephrosis or contour deforming renal mass. Mild symmetric wall thickening of an incompletely distended urinary  bladder, similar prior. Stomach/Bowel: Radiopaque enteric contrast material traverses the rectum. Stomach is unremarkable for degree of distension. No pathologic dilation of small or large bowel. The appendix and terminal ileum appear normal. No evidence of acute bowel inflammation. Sigmoid colonic diverticulosis without findings of acute diverticulitis. Vascular/Lymphatic: No abdominal aortic aneurysm. No pathologically enlarged abdominal or pelvic lymph nodes. Reproductive: Mild enlargement of prostate gland. Other: No significant abdominopelvic free fluid. Musculoskeletal: Multilevel degenerative changes spine. Similar avascular necrosis of the bilateral femoral heads. No aggressive lytic or blastic lesion of bone. IMPRESSION: 1. Overall stable examination status postsurgical left nephrectomy without evidence of new or progressive disease in the chest abdomen or pelvis. 2. Stable to minimally decreased size of the 5 mm pulmonary nodule along the minor fissure in the right chest. No new suspicious pulmonary nodules or masses. 3. Sigmoid colonic diverticulosis without findings of acute diverticulitis. 4. Avascular necrosis of the bilateral femoral heads. 5. Aortic Atherosclerosis (ICD10-I70.0) and Emphysema (ICD10-J43.9). Electronically Signed   By: Dahlia Bailiff M.D.   On: 11/28/2020 15:46     Assessment and plan-  Patient is a 61 y.o. male history of CAD, status post stent, CHF, history of ANCA positive sclerosing/crescentic GN, chronic kidney disease stage III presents for treatments of  stage IV renal cell carcinoma 1. Renal cell carcinoma of left kidney (HCC)   2. Transaminitis   3. Elevated LFTs   4. History of CHF (congestive heart failure)   5. Neurocysticercosis   6. Stage 3b chronic kidney disease ([place])    Cancer Staging  Renal cell carcinoma of left kidney ([place]) Staging form: Kidney, AJCC 8th Edition - Pathologic: Stage IV (pT3a, pNX, cM1) - Signed by Earlie Server, MD on 05/23/2019   # Stage IV renal cell carcinoma Labs reviewed and discussed with patient. Transaminitis, possible immunotherapy side effects.  Hold Keytruda today. I recommend checking LFT weekly.  Obtain ultrasound right upper quadrant.  If transaminitis continues to trend up, I will refer patient to establish care with GI.  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  Patient has been referred to orthopedic surgeon in [place] which he is not able to go due to transportation.  He does not have insurance as well.  He is currently asymptomatic.  Monitor.  #Hypertension, CHF,  Continue metoprolol, lisinopril.  Lasix Stable.   #Brain lesion-  neurocysticercosis finished therapy.  Follow-up with ID There is a new lesion, Dr.Ravishanka plans to consult expert and let patient know.  #CKD, avoid nephrotoxins. Encourage oral hydration.   Spanish interpreter presented for the entire encounter for translation. We spent sufficient time to discuss many aspect of care, questions were answered to patient's satisfaction. Follow-up weekly LFT.  Follow-up TBD.  Earlie Server, MD, PhD  01/17/2021

## 2021-01-21 ENCOUNTER — Ambulatory Visit
Admission: RE | Admit: 2021-01-21 | Discharge: 2021-01-21 | Disposition: A | Discharge status: Home / Self Care | Payer: Self-pay | Source: Ambulatory Visit | Attending: Oncology | Admitting: Oncology

## 2021-01-21 DIAGNOSIS — R7401 Elevation of levels of liver transaminase levels: Secondary | ICD-10-CM

## 2021-01-24 ENCOUNTER — Other Ambulatory Visit: Payer: Self-pay

## 2021-01-24 ENCOUNTER — Inpatient Hospital Stay: Payer: Self-pay

## 2021-01-24 DIAGNOSIS — R7401 Elevation of levels of liver transaminase levels: Secondary | ICD-10-CM

## 2021-01-24 DIAGNOSIS — C642 Malignant neoplasm of left kidney, except renal pelvis: Secondary | ICD-10-CM

## 2021-01-24 LAB — HEPATIC FUNCTION PANEL
ALT: 140 U/L — ABNORMAL HIGH (ref 0–44)
AST: 112 U/L — ABNORMAL HIGH (ref 15–41)
Albumin: 4.1 g/dL (ref 3.5–5.0)
Alkaline Phosphatase: 120 U/L (ref 38–126)
Bilirubin, Direct: <0.1 mg/dL (ref 0.0–0.2)
Total Bilirubin: 1 mg/dL (ref 0.3–1.2)
Total Protein: 6.7 g/dL (ref 6.5–8.1)

## 2021-01-24 LAB — HEPATITIS PANEL, ACUTE
HCV Ab: NONREACTIVE
Hep A IgM: NONREACTIVE
Hep B C IgM: NONREACTIVE
Hepatitis B Surface Ag: NONREACTIVE

## 2021-01-31 ENCOUNTER — Inpatient Hospital Stay: Payer: Self-pay

## 2021-01-31 ENCOUNTER — Other Ambulatory Visit: Payer: Self-pay

## 2021-01-31 DIAGNOSIS — C642 Malignant neoplasm of left kidney, except renal pelvis: Secondary | ICD-10-CM

## 2021-01-31 LAB — HEPATIC FUNCTION PANEL
ALT: 91 U/L — ABNORMAL HIGH (ref 0–44)
AST: 77 U/L — ABNORMAL HIGH (ref 15–41)
Albumin: 4 g/dL (ref 3.5–5.0)
Alkaline Phosphatase: 143 U/L — ABNORMAL HIGH (ref 38–126)
Bilirubin, Direct: <0.1 mg/dL (ref 0.0–0.2)
Total Bilirubin: 0.5 mg/dL (ref 0.3–1.2)
Total Protein: 6.7 g/dL (ref 6.5–8.1)

## 2021-02-07 ENCOUNTER — Other Ambulatory Visit: Payer: Self-pay

## 2021-02-07 ENCOUNTER — Inpatient Hospital Stay: Payer: Self-pay

## 2021-02-07 DIAGNOSIS — C642 Malignant neoplasm of left kidney, except renal pelvis: Secondary | ICD-10-CM

## 2021-02-07 DIAGNOSIS — R7401 Elevation of levels of liver transaminase levels: Secondary | ICD-10-CM

## 2021-02-07 LAB — HEPATIC FUNCTION PANEL
ALT: 87 U/L — ABNORMAL HIGH (ref 0–44)
AST: 70 U/L — ABNORMAL HIGH (ref 15–41)
Albumin: 4.1 g/dL (ref 3.5–5.0)
Alkaline Phosphatase: 124 U/L (ref 38–126)
Bilirubin, Direct: 0.1 mg/dL (ref 0.0–0.2)
Indirect Bilirubin: 0.7 mg/dL (ref 0.3–0.9)
Total Bilirubin: 0.8 mg/dL (ref 0.3–1.2)
Total Protein: 7.1 g/dL (ref 6.5–8.1)

## 2021-02-12 ENCOUNTER — Telehealth: Payer: Self-pay

## 2021-02-12 NOTE — Progress Notes (Signed)
Phone note created 

## 2021-02-12 NOTE — Telephone Encounter (Signed)
-----   Message from Earlie Server, MD sent at 02/12/2021  8:31 AM EST ----- LFT is trending down.  Please arrange him to see me lab md [place] in 1 week

## 2021-02-12 NOTE — Telephone Encounter (Signed)
[place], please schedule patient: Lab/MD/Keytruda in 1 week Notify patient of appt. Thanks

## 2021-02-12 NOTE — Telephone Encounter (Signed)
Pt informed of appts. Appt reminder mailed.

## 2021-02-20 ENCOUNTER — Inpatient Hospital Stay: Payer: Self-pay

## 2021-02-20 ENCOUNTER — Encounter: Payer: Self-pay | Admitting: Oncology

## 2021-02-20 ENCOUNTER — Inpatient Hospital Stay: Payer: Self-pay | Attending: Oncology

## 2021-02-20 ENCOUNTER — Other Ambulatory Visit: Payer: Self-pay

## 2021-02-20 ENCOUNTER — Encounter: Payer: Self-pay | Admitting: Licensed Clinical Social Worker

## 2021-02-20 ENCOUNTER — Inpatient Hospital Stay (HOSPITAL_BASED_OUTPATIENT_CLINIC_OR_DEPARTMENT_OTHER): Payer: Self-pay | Admitting: Oncology

## 2021-02-20 VITALS — BP 139/82 | HR 71 | Temp 96.2°F | Resp 18 | Wt 162.8 lb

## 2021-02-20 DIAGNOSIS — Z7982 Long term (current) use of aspirin: Secondary | ICD-10-CM | POA: Insufficient documentation

## 2021-02-20 DIAGNOSIS — Z5112 Encounter for antineoplastic immunotherapy: Secondary | ICD-10-CM

## 2021-02-20 DIAGNOSIS — R7989 Other specified abnormal findings of blood chemistry: Secondary | ICD-10-CM

## 2021-02-20 DIAGNOSIS — Z905 Acquired absence of kidney: Secondary | ICD-10-CM | POA: Insufficient documentation

## 2021-02-20 DIAGNOSIS — B690 Cysticercosis of central nervous system: Secondary | ICD-10-CM

## 2021-02-20 DIAGNOSIS — I7782 Antineutrophilic cytoplasmic antibody (ANCA) vasculitis: Secondary | ICD-10-CM | POA: Insufficient documentation

## 2021-02-20 DIAGNOSIS — Z79891 Long term (current) use of opiate analgesic: Secondary | ICD-10-CM | POA: Insufficient documentation

## 2021-02-20 DIAGNOSIS — R7401 Elevation of levels of liver transaminase levels: Secondary | ICD-10-CM | POA: Insufficient documentation

## 2021-02-20 DIAGNOSIS — I5042 Chronic combined systolic (congestive) and diastolic (congestive) heart failure: Secondary | ICD-10-CM | POA: Insufficient documentation

## 2021-02-20 DIAGNOSIS — C642 Malignant neoplasm of left kidney, except renal pelvis: Secondary | ICD-10-CM

## 2021-02-20 DIAGNOSIS — E785 Hyperlipidemia, unspecified: Secondary | ICD-10-CM | POA: Insufficient documentation

## 2021-02-20 DIAGNOSIS — M879 Osteonecrosis, unspecified: Secondary | ICD-10-CM | POA: Insufficient documentation

## 2021-02-20 DIAGNOSIS — N1832 Chronic kidney disease, stage 3b: Secondary | ICD-10-CM | POA: Insufficient documentation

## 2021-02-20 DIAGNOSIS — Z7901 Long term (current) use of anticoagulants: Secondary | ICD-10-CM | POA: Insufficient documentation

## 2021-02-20 DIAGNOSIS — I130 Hypertensive heart and chronic kidney disease with heart failure and stage 1 through stage 4 chronic kidney disease, or unspecified chronic kidney disease: Secondary | ICD-10-CM | POA: Insufficient documentation

## 2021-02-20 DIAGNOSIS — Z79899 Other long term (current) drug therapy: Secondary | ICD-10-CM | POA: Insufficient documentation

## 2021-02-20 DIAGNOSIS — G893 Neoplasm related pain (acute) (chronic): Secondary | ICD-10-CM | POA: Insufficient documentation

## 2021-02-20 DIAGNOSIS — Z955 Presence of coronary angioplasty implant and graft: Secondary | ICD-10-CM | POA: Insufficient documentation

## 2021-02-20 DIAGNOSIS — I255 Ischemic cardiomyopathy: Secondary | ICD-10-CM | POA: Insufficient documentation

## 2021-02-20 DIAGNOSIS — I2510 Atherosclerotic heart disease of native coronary artery without angina pectoris: Secondary | ICD-10-CM | POA: Insufficient documentation

## 2021-02-20 LAB — CBC WITH DIFFERENTIAL/PLATELET
Abs Immature Granulocytes: 0.02 10*3/uL (ref 0.00–0.07)
Basophils Absolute: 0 10*3/uL (ref 0.0–0.1)
Basophils Relative: 0 %
Eosinophils Absolute: 0.3 10*3/uL (ref 0.0–0.5)
Eosinophils Relative: 3 %
HCT: 40.3 % (ref 39.0–52.0)
Hemoglobin: 12.6 g/dL — ABNORMAL LOW (ref 13.0–17.0)
Immature Granulocytes: 0 %
Lymphocytes Relative: 23 %
Lymphs Abs: 2.3 10*3/uL (ref 0.7–4.0)
MCH: 26.1 pg (ref 26.0–34.0)
MCHC: 31.3 g/dL (ref 30.0–36.0)
MCV: 83.6 fL (ref 80.0–100.0)
Monocytes Absolute: 1.2 10*3/uL — ABNORMAL HIGH (ref 0.1–1.0)
Monocytes Relative: 12 %
Neutro Abs: 6.1 10*3/uL (ref 1.7–7.7)
Neutrophils Relative %: 62 %
Platelets: 161 10*3/uL (ref 150–400)
RBC: 4.82 MIL/uL (ref 4.22–5.81)
RDW: 14.6 % (ref 11.5–15.5)
WBC: 10 10*3/uL (ref 4.0–10.5)
nRBC: 0 % (ref 0.0–0.2)

## 2021-02-20 LAB — COMPREHENSIVE METABOLIC PANEL
ALT: 65 U/L — ABNORMAL HIGH (ref 0–44)
AST: 54 U/L — ABNORMAL HIGH (ref 15–41)
Albumin: 4.1 g/dL (ref 3.5–5.0)
Alkaline Phosphatase: 107 U/L (ref 38–126)
Anion gap: 6 (ref 5–15)
BUN: 48 mg/dL — ABNORMAL HIGH (ref 6–20)
CO2: 25 mmol/L (ref 22–32)
Calcium: 8.8 mg/dL — ABNORMAL LOW (ref 8.9–10.3)
Chloride: 104 mmol/L (ref 98–111)
Creatinine, Ser: 2.34 mg/dL — ABNORMAL HIGH (ref 0.61–1.24)
GFR, Estimated: 31 mL/min — ABNORMAL LOW (ref >60)
Glucose, Bld: 110 mg/dL — ABNORMAL HIGH (ref 70–99)
Potassium: 5.1 mmol/L (ref 3.5–5.1)
Sodium: 135 mmol/L (ref 135–145)
Total Bilirubin: 0.6 mg/dL (ref 0.3–1.2)
Total Protein: 7 g/dL (ref 6.5–8.1)

## 2021-02-20 MED ORDER — SODIUM CHLORIDE 0.9 % IV SOLN
200.0000 mg | Freq: Once | INTRAVENOUS | Status: AC
  Administered 2021-02-20: 200 mg via INTRAVENOUS
  Filled 2021-02-20: qty 200

## 2021-02-20 MED ORDER — SODIUM CHLORIDE 0.9 % IV SOLN
Freq: Once | INTRAVENOUS | Status: AC
  Filled 2021-02-20: qty 250

## 2021-02-20 NOTE — Progress Notes (Signed)
Hematology/Oncology Progress Note [phone] [phone] Patient Care Team: Center, [facility] as PCP - General (General Practice)   Name of the patient: Mark Donaldson  [id number]  [birth year]  Date of visit: 02/20/21   INTERVAL HISTORY-  61 y.o. male presents for follow-up of renal cell carcinoma treatments. Patient has past medical history CAD status post PCI to LAD in 2018, on aspirin and Plavix, CHF, history of focal sclerosing/crescentic GN-ANCA positive and CKD stage III. He was hospitalized from 05/02/2019-05/07/2019 due to gross hematuria. CT showed a 7.4 cm left lower pole renal mass concerning for RCC.  Patient also has necrotic lymphadenopathy in the mediastinum and the right supra hilar region which were highly suspicious for metastatic disease.  Bilateral lung nodules, also concerning for metastatic lung disease.  Right retrocrural lymphadenopathy with upper normal left periaortic lymph node. Patient was recommended for cytoreductive radical nephrectomy.  With history of ANCA positive vasculitis, also recommend thoracic lymphadenopathy biopsy to confirm distal metastasis.  Patient agreed to radical nephrectomy and declined bronchoscopy biopsy. He underwent left radical nephrectomy on 05/05/2019. Pathology showed pT3a pNx, RCC, conventional clear cell type, grade 3 Patient was discharged home. Today he presents to discuss pathology and the management plan. He was accompanied by his wife and daughter. Patient reports that he uses hydrocodone for pain.  Pain around the surgical site has improved.  He occasionally has tingling sensation around the surgical site.  Denies any fever or chills, drainage from the surgical site.  # Interval CT chest abdomen pelvis was independently reviewed by me and discussed with patient. 01/12/2020, CT showed overall marked improvement.  Comparing to CT scan in August 2021, continues to have treatment  response.Mediastinal soft tissue nodule nearly completely resolved, right minor fissure nodule 8 mm, decreased in size.  Resolution of pleural fluid and thickening of the right chest.  Small pleural effusion in the left chest.  No new lung nodules.-Subtle variation of the muscle architecture of the left psoas muscle of unknown significance.-Continue observation.  He is asymptomatic. Subtle nodule in the soft tissue of the left flank.  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 He finished 14 days of albendazole plus praziquantel .   He has had few side effects including poor appetite, hair loss, weight loss, bloody stool, abdominal cramping or diarrhea. All symptoms have improved since he completed the treatment. 03/16/2020 he finished tapering course of steroids  # It has been noted that his glucose level has been elevated in December 2021, even before he was started on prednisone treatments. Dr. Steva Ready has obtained A1c which came back elevated at 8.7.  # 04/30/2020, CT chest abdomen pelvis without contrast showed stable disease.   #04/30/2020, axitinib and immunotherapy were held due to diarrhea and fatigue. #05/15/2020 resumed on immunotherapy. 07/31/2020, CT chest abdomen pelvis showed stable disease 09/11/2020 resumed axitinib 5 mg twice daily, stopped on 10/23/2020 due to diarrhea.   #Intermittent orthopnea, symptom has resolved.  Continue Lasix Pre-existing diastolic CHF, 4/40/1027 LVEF 60 to 65%.   Recommend him to follow-up with cardiology-he is has not able to follow-up with cardiology due to lack of insurance coverage.  11/28/2020, CT abdomen pelvis without contrast showed stable examination.  No evidence of new or progressive disease in the chest abdomen pelvis.  Stable to minimally decreased size of 5 mm pulmonary nodule in the minor fissure in the right chest.  Sigmoid colon diverticulosis without findings of acute diverticulitis.  INTERVAL HISTORY Mark Donaldson is  a 61 y.o. male who has above history reviewed by me today presents for follow up visit for management of metastatic RCC Problems and complaints are listed below: Patient is Spanish-speaking.   Spanish interpreter service was utilized for the entire encounter for translation He reports feeling well. No new complaints.      Review of systems- Review of Systems  Constitutional:  Negative for appetite change, chills, fatigue, fever and unexpected weight change.  HENT:   Negative for voice change.   Eyes:  Negative for eye problems and icterus.  Respiratory:  Negative for chest tightness and cough.   Cardiovascular:  Negative for chest pain and leg swelling.  Gastrointestinal:  Negative for abdominal distention, abdominal pain and diarrhea.  Endocrine: Negative for hot flashes.  Genitourinary:  Negative for difficulty urinating, dysuria and frequency.   Musculoskeletal:  Positive for arthralgias.  Skin:  Negative for itching and rash.  Neurological:  Negative for light-headedness and numbness.  Hematological:  Negative for adenopathy. Does not bruise/bleed easily.  Psychiatric/Behavioral:  Negative for confusion.    No Known Allergies  Patient Active Problem List   Diagnosis Date Noted   Transaminitis 01/17/2021   History of CHF (congestive heart failure) 05/01/2020   Hyperglycemia 02/28/2020   Stage 3b chronic kidney disease ([place]) 10/04/2019   Brain lesion 10/04/2019   Encounter for antineoplastic immunotherapy 07/19/2019   Encounter for antineoplastic chemotherapy 07/19/2019   CNS lesion 07/07/2019   Anemia in stage 3b chronic kidney disease ([place]) 06/07/2019   Goals of care, counseling/discussion 05/23/2019   Renal cell carcinoma of left kidney ([place]) 05/23/2019   Renal cell carcinoma ([place]) 05/23/2019   Palliative care encounter    History of vasculitis    Thoracic lymphadenopathy    Lung nodule    CAD S/P percutaneous coronary angioplasty 05/02/2019   Kidney mass  05/02/2019   Hydronephrosis of left kidney 05/02/2019   UTI (urinary tract infection) 05/02/2019   AKI (acute kidney injury) ([place]) 05/02/2019   Gross hematuria 05/02/2019   Chronic diastolic CHF (congestive heart failure) ([place]) 05/02/2019   Sepsis ([place]) 05/02/2019   Severe sepsis ([place]) 05/02/2019   Preop cardiovascular exam    Hyperlipidemia 05/21/2016   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 ([place]) 05/21/2016   Prediabetes 05/21/2016   Alcohol abuse 05/18/2016   Essential hypertension 05/18/2016   Vasculitis, ANCA positive 05/18/2016   Shortness of breath 05/17/2016     Past Medical History:  Diagnosis Date   ANCA-positive vasculitis    CAD (coronary artery disease)    Essential hypertension    HFrEF (heart failure with reduced ejection fraction) (HCC)    Hyperlipidemia LDL goal <70    Ischemic cardiomyopathy    Renal cell carcinoma ([place]) 05/23/2019   Renal disorder      Past Surgical History:  Procedure Laterality Date   CARDIAC SURGERY     cardiac cath with 2 stent placement   LAPAROSCOPIC NEPHRECTOMY, HAND ASSISTED Left 05/05/2019   Procedure: HAND ASSISTED LAPAROSCOPIC NEPHRECTOMY;  Surgeon: Hollice Espy, MD;  Location: [facility] ORS;  Service: Urology;  Laterality: Left;    Social History   Socioeconomic History   Marital status: Married    Spouse name: Not on file   Number of children: Not on file   Years of education: Not on file   Highest education level: Not on file  Occupational History   Not on file  Tobacco Use   Smoking status: Never   Smokeless tobacco: Never  Substance and Sexual  Activity   Alcohol use: Yes   Drug use: Not on file   Sexual activity: Not on file  Other Topics Concern   Not on file  Social History Narrative   Not on file   Social Determinants of Health   Financial Resource Strain: Not on file  Food Insecurity: Not on file  Transportation Needs: Not on file  Physical Activity: Not on file  Stress: Not on file   Social Connections: Not on file  Intimate Partner Violence: Not on file     Family History  Problem Relation Age of Onset   Cancer Mother    Blindness Brother      Current Outpatient Medications:    aspirin 81 MG EC tablet, Take 81 mg by mouth daily. Swallow whole., Disp: , Rfl:    atorvastatin (LIPITOR) 80 MG tablet, Take 80 mg by mouth daily., Disp: , Rfl:    clopidogrel (PLAVIX) 75 MG tablet, Take 75 mg by mouth daily., Disp: , Rfl:    furosemide (LASIX) 20 MG tablet, Take 1 tablet (20 mg total) by mouth daily., Disp: 30 tablet, Rfl: 3   KEYTRUDA 100 MG/4ML SOLN, INJECT 200MG  INTRAVENOUSLY EVERY 3 WEEKS, Disp: 8 mL, Rfl: 11   lisinopril (ZESTRIL) 10 MG tablet, Take 10 mg by mouth daily., Disp: , Rfl:    metoprolol succinate (TOPROL-XL) 100 MG 24 hr tablet, Take 1.5 tablets by mouth daily., Disp: , Rfl:    axitinib (INLYTA) 5 MG tablet, Take 1 tablet (5 mg total) by mouth 2 (two) times daily. (Patient not taking: Reported on 11/13/2020), Disp: 60 tablet, Rfl: 1   loperamide (IMODIUM) 2 MG capsule, Take 1 capsule (2 mg total) by mouth See admin instructions. Take 2 tablets with onset of diarrhea, then 1 tablet for every 2 hours until diarrhea stops. Maximum 8 tablets per 24 hours. (Patient not taking: Reported on 11/13/2020), Disp: 60 capsule, Rfl: 2   Physical exam:  There were no vitals filed for this visit.  Physical Exam Constitutional:      General: He is not in acute distress.    Appearance: He is not diaphoretic.  HENT:     Head: Normocephalic and atraumatic.     Nose: Nose normal.     Mouth/Throat:     Pharynx: No oropharyngeal exudate.  Eyes:     General: No scleral icterus.    Pupils: Pupils are equal, round, and reactive to light.  Cardiovascular:     Rate and Rhythm: Normal rate and regular rhythm.     Heart sounds: No murmur heard. Pulmonary:     Effort: Pulmonary effort is normal. No respiratory distress.     Breath sounds: No rales.  Chest:     Chest wall:  No tenderness.  Abdominal:     General: There is no distension.     Palpations: Abdomen is soft.     Tenderness: There is no abdominal tenderness.  Musculoskeletal:        General: Normal range of motion.     Cervical back: Normal range of motion and neck supple.     Comments: Right shoulder range of motion limited due to pain.  Skin:    General: Skin is warm and dry.     Findings: No erythema.  Neurological:     Mental Status: He is alert and oriented to person, place, and time.     Cranial Nerves: No cranial nerve deficit.     Motor: No abnormal muscle tone.  Coordination: Coordination normal.  Psychiatric:        Mood and Affect: Affect normal.       CMP Latest Ref Rng & Units 02/07/2021  Glucose 70 - 99 mg/dL -  BUN 6 - 20 mg/dL -  Creatinine 0.61 - 1.24 mg/dL -  Sodium 135 - 145 mmol/L -  Potassium 3.5 - 5.1 mmol/L -  Chloride 98 - 111 mmol/L -  CO2 22 - 32 mmol/L -  Calcium 8.9 - 10.3 mg/dL -  Total Protein 6.5 - 8.1 g/dL 7.1  Total Bilirubin 0.3 - 1.2 mg/dL 0.8  Alkaline Phos 38 - 126 U/L 124  AST 15 - 41 U/L 70(H)  ALT 0 - 44 U/L 87(H)   CBC Latest Ref Rng & Units 02/20/2021  WBC 4.0 - 10.5 K/uL 10.0  Hemoglobin 13.0 - 17.0 g/dL 12.6(L)  Hematocrit 39.0 - 52.0 % 40.3  Platelets 150 - 400 K/uL 161    RADIOGRAPHIC STUDIES: I have personally reviewed the radiological images as listed and agreed with the findings in the report. CT CHEST ABDOMEN PELVIS WO CONTRAST  Result Date: 11/28/2020 CLINICAL DATA:  History of metastatic RCC status post left nephrectomy without ongoing chemotherapy EXAM: CT CHEST, ABDOMEN AND PELVIS WITHOUT CONTRAST TECHNIQUE: Multidetector CT imaging of the chest, abdomen and pelvis was performed following the standard protocol without IV contrast. COMPARISON:  Multiple priors including most recent CT July 31, 2020 FINDINGS: CT CHEST FINDINGS Cardiovascular: No thoracic aortic aneurysm. Normal size heart. Normal caliber central pulmonary  arteries. No significant pericardial effusion/thickening. Three-vessel coronary artery calcifications. Mediastinum/Nodes: No supraclavicular adenopathy. No discrete thyroid nodularity. No pathologically enlarged mediastinal, hilar or axillary lymph nodes within the limitation of noncontrast examination. Stable paramediastinal soft tissue nodularity for instance on image 21/3 measuring 4 mm. No new or enlarging lesions. The trachea and esophagus are grossly unremarkable. Lungs/Pleura: Stable to minimally decreased size of the pulmonary nodule along the minor fissure in the right chest now measuring 5 mm on image 79/4 previously 6 mm. No new suspicious pulmonary nodules or masses. Mild centrilobular emphysema. Diffuse bronchial wall thickening. Near complete resolution of the previously seen diffuse ground-glass micro nodularity. Stable partially loculated fluid along the left major fissure. No pneumothorax. Musculoskeletal: No aggressive lytic or blastic lesion of bone. CT ABDOMEN PELVIS FINDINGS Hepatobiliary: Unremarkable noncontrast appearance of the hepatic parenchyma. Gallbladder is unremarkable. No biliary ductal dilation. Pancreas: No pancreatic ductal dilation or evidence of acute inflammation. Spleen: Within normal limits. Adrenals/Urinary Tract: Bilateral adrenal glands appear normal. Stable postsurgical changes of left nephrectomy without new suspicious soft tissue nodularity visualized in the nephrectomy bed. Right kidney is unremarkable without hydronephrosis or contour deforming renal mass. Mild symmetric wall thickening of an incompletely distended urinary bladder, similar prior. Stomach/Bowel: Radiopaque enteric contrast material traverses the rectum. Stomach is unremarkable for degree of distension. No pathologic dilation of small or large bowel. The appendix and terminal ileum appear normal. No evidence of acute bowel inflammation. Sigmoid colonic diverticulosis without findings of acute  diverticulitis. Vascular/Lymphatic: No abdominal aortic aneurysm. No pathologically enlarged abdominal or pelvic lymph nodes. Reproductive: Mild enlargement of prostate gland. Other: No significant abdominopelvic free fluid. Musculoskeletal: Multilevel degenerative changes spine. Similar avascular necrosis of the bilateral femoral heads. No aggressive lytic or blastic lesion of bone. IMPRESSION: 1. Overall stable examination status postsurgical left nephrectomy without evidence of new or progressive disease in the chest abdomen or pelvis. 2. Stable to minimally decreased size of the 5 mm pulmonary nodule along the minor  fissure in the right chest. No new suspicious pulmonary nodules or masses. 3. Sigmoid colonic diverticulosis without findings of acute diverticulitis. 4. Avascular necrosis of the bilateral femoral heads. 5. Aortic Atherosclerosis (ICD10-I70.0) and Emphysema (ICD10-J43.9). Electronically Signed   By: Dahlia Bailiff M.D.   On: 11/28/2020 15:46   US Abdomen Limited RUQ (LIVER/GB)  Result Date: 01/21/2021 CLINICAL DATA:  Transaminitis. EXAM: ULTRASOUND ABDOMEN LIMITED RIGHT UPPER QUADRANT COMPARISON:  CT chest abdomen pelvis 11/28/2020. FINDINGS: Gallbladder: No gallstones or wall thickening visualized. No sonographic Murphy sign noted by sonographer. Common bile duct: Diameter: 2 mm, within normal limits. Liver: Diffusely increased in echogenicity. No focal lesion. Portal vein is patent on color Doppler imaging with normal direction of blood flow towards the liver. Other: None. IMPRESSION: Hepatic steatosis. Electronically Signed   By: Lorin Picket M.D.   On: 01/21/2021 11:44     Assessment and plan-  Patient is a 61 y.o. male history of CAD, status post stent, CHF, history of ANCA positive sclerosing/crescentic GN, chronic kidney disease stage III presents for treatments of stage IV renal cell carcinoma 1. Renal cell carcinoma of left kidney (HCC)   2. Encounter for antineoplastic  immunotherapy   3. Elevated LFTs   4. Neurocysticercosis    Cancer Staging  Renal cell carcinoma of left kidney ([place]) Staging form: Kidney, AJCC 8th Edition - Pathologic: Stage IV (pT3a, pNX, cM1) - Signed by Earlie Server, MD on 05/23/2019   # Stage IV renal cell carcinoma Labs are reviewed and discussed with patient. Proceed with [place].   Transaminitis, possible immunotherapy side effects.  Trending down. Ok to resume treatment with close monitor. He is also on Statin.   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  Patient has been referred to orthopedic surgeon in [place] which he is not able to go due to transportation.  He does not have insurance as well.  He is currently asymptomatic.  Monitor.  #Brain lesion-  neurocysticercosis finished therapy.  Follow-up with ID There was a new lesion, Dr.Ravishanka planned to consult expert and let patient know. I defer treatment to ID.   #CKD, avoid nephrotoxins. Encourage oral hydration.   Spanish interpreter presented for the entire encounter for translation. We spent sufficient time to discuss many aspect of care, questions were answered to patient's satisfaction.  Follow up in 3 weeks.   Earlie Server, MD, PhD  02/20/2021

## 2021-02-20 NOTE — Patient Instructions (Signed)
[facility]CER CTR AT [place]  Discharge Instructions: Thank you for choosing [facility] to provide your oncology and hematology care.  If you have a lab appointment with the [facility], please go directly to the [facility] and check in at the registration area.  Wear comfortable clothing and clothing appropriate for easy access to any Portacath or PICC line.   We strive to give you quality time with your provider. You may need to reschedule your appointment if you arrive late (15 or more minutes).  Arriving late affects you and other patients whose appointments are after yours.  Also, if you miss three or more appointments without notifying the office, you may be dismissed from the clinic at the providers discretion.      For prescription refill requests, have your pharmacy contact our office and allow 72 hours for refills to be completed.    Today you received the following chemotherapy and/or immunotherapy agents KEYTRUDA       To help prevent nausea and vomiting after your treatment, we encourage you to take your nausea medication as directed.  BELOW ARE SYMPTOMS THAT SHOULD BE REPORTED IMMEDIATELY: *FEVER GREATER THAN 100.4 F (38 C) OR HIGHER *CHILLS OR SWEATING *NAUSEA AND VOMITING THAT IS NOT CONTROLLED WITH YOUR NAUSEA MEDICATION *UNUSUAL SHORTNESS OF BREATH *UNUSUAL BRUISING OR BLEEDING *URINARY PROBLEMS (pain or burning when urinating, or frequent urination) *BOWEL PROBLEMS (unusual diarrhea, constipation, pain near the anus) TENDERNESS IN MOUTH AND THROAT WITH OR WITHOUT PRESENCE OF ULCERS (sore throat, sores in mouth, or a toothache) UNUSUAL RASH, SWELLING OR PAIN  UNUSUAL VAGINAL DISCHARGE OR ITCHING   Items with * indicate a potential emergency and should be followed up as soon as possible or go to the Emergency Department if any problems should occur.  Please show the CHEMOTHERAPY ALERT CARD or IMMUNOTHERAPY ALERT CARD at check-in to  the Emergency Department and triage nurse.  Should you have questions after your visit or need to cancel or reschedule your appointment, please contact [facility] [place] AT [place] [address]  [phone] and follow the prompts.  Office hours are 8:00 a.m. to 4:30 p.m. Monday - Friday. Please note that voicemails left after 4:00 p.m. may not be returned until the following business day.  We are closed weekends and major holidays. You have access to a nurse at all times for urgent questions. Please call the main number to the clinic [phone] and follow the prompts.  For any non-urgent questions, you may also contact your provider using MyChart. We now offer e-Visits for anyone 45 and older to request care online for non-urgent symptoms. For details visit [email or web address].   Also download the MyChart app! Go to the app store, search "MyChart", open the app, select [facility], and log in with your MyChart username and password.  Due to Covid, a mask is required upon entering the hospital/clinic. If you do not have a mask, one will be given to you upon arrival. For doctor visits, patients may have 1 support person aged 40 or older with them. For treatment visits, patients cannot have anyone with them due to current Covid guidelines and our immunocompromised population.   Pembrolizumab injection Qu es este medicamento? El PEMBROLIZUMAB es un anticuerpo monoclonal. [place] para tratar ciertos tipos de cncer. Este medicamento puede ser utilizado para otros usos; si tiene alguna pregunta consulte con su proveedor de atencin mdica o con su farmacutico. MARCAS COMUNES: Keytruda Qu le debo informar a mi profesional de  la salud antes de tomar este medicamento? Necesitan saber si usted presenta alguno de los siguientes problemas o situaciones: enfermedades autoinmunes tales como enfermedad de Crohn, colitis ulcerativa o lupus ha tenido o planea tener un trasplante alognico de Education officer, museum ([place] las clulas madre de Theatre manager persona) antecedentes de trasplante de rganos antecedentes de radiacin en el pecho problemas del sistema nervioso, tales como miastenia grave o sndrome de Curator una reaccin alrgica o inusual al pembrolizumab, a otros medicamentos, alimentos, colorantes o conservantes si est embarazada o buscando quedar embarazada si est amamantando a un beb Cmo debo utilizar este medicamento? Este medicamento se administra mediante infusin en una vena. Lo administra un profesional de Technical sales engineer en un hospital o en un entorno clnico. Se le entregar una Gua del medicamento (MedGuide, su nombre en ingls) especial antes de cada tratamiento. Asegrese de leer esta informacin cada vez cuidadosamente. Hable con su pediatra para informarse acerca del uso de este medicamento en nios. Aunque este medicamento se puede recetar a nios tan pequeos como de 6 meses de edad con ciertas afecciones, existen precauciones que deben tomarse. Sobredosis: Pngase en contacto inmediatamente con un centro toxicolgico o una sala de urgencia si usted cree que haya tomado demasiado medicamento. ATENCIN: [employer] es solo para usted. No comparta este medicamento con nadie. Qu sucede si me olvido de una dosis? Es importante no olvidar ninguna dosis. Informe a su mdico o a su profesional de la salud si no puede asistir a Photographer. Qu puede interactuar con este medicamento? No se han estudiado las interacciones. Puede ser que esta lista no menciona todas las posibles interacciones. Informe a su profesional de [employer]	[place] de [employer] productos a base de hierbas, medicamentos de [place] o suplementos nutritivos que est tomando. Si usted fuma, consume bebidas alcohlicas o si utiliza drogas ilegales, indqueselo tambin a su profesional de [employer]	[place]. Algunas sustancias pueden interactuar con su medicamento. A qu debo estar atento al usar [employer]? Se supervisar  su estado de salud atentamente mientras reciba este medicamento. Usted podra necesitar realizarse [employer] de sangre mientras est usando [place]. No debe quedar embarazada mientras est usando este medicamento o por 4 meses despus de dejar de usarlo. Las mujeres deben informar a su mdico si estn buscando quedar embarazadas o si creen que podran estar embarazadas. Existe la posibilidad de efectos secundarios graves en un beb sin nacer. Para obtener ms informacin, hable con su profesional de la salud o su farmacutico. No debe amamantar a un beb mientras est usando este medicamento o durante 4 meses despus de la ltima dosis. Qu efectos secundarios puedo tener al [employer] este medicamento? Efectos secundarios que debe informar a su mdico o a Barrister's clerk de la salud tan pronto como sea posible: Chief of Staff, tales como erupcin cutnea, comezn/picazn o urticaria, e hinchazn de la cara, los labios o la lengua con sangre o de color negro y aspecto alquitranado problemas para respirar cambios en la visin dolor en el pecho escalofros confusin estreimiento tos diarrea mareo, sensacin de desmayo o aturdimiento frecuencia cardiaca rpida o irregular fiebre enrojecimiento dolor en las articulaciones recuentos sanguneos bajos: este medicamento podra reducir la cantidad de glbulos blancos, glbulos rojos y plaquetas. Su riesgo de infeccin y sangrado podra ser mayor. dolor muscular debilidad muscular dolor, hormigueo o entumecimiento de las manos o los pies dolor de cabeza persistente enrojecimiento, formacin de ampollas, descamacin o distensin de la piel, incluso dentro de la boca signos y sntomas de  niveles elevados de azcar en la sangre, tales como mareo; boca seca; piel seca; aliento frutal; nuseas; dolor de estmago; aumento del apetito o la sed; aumento de la frecuencia urinaria signos y sntomas de lesin renal, tales como dificultad para  Garment/textile technologist o cambios en la cantidad de orina signos y sntomas de lesin en el hgado, como orina oscura, heces claras, prdida del apetito, nuseas, dolor en la regin abdominal superior derecha, color amarillento de los ojos o la piel sudoracin ganglios linfticos inflamados prdida de peso Efectos secundarios que generalmente no requieren atencin mdica (infrmelos a su mdico o a su profesional de la salud si persisten o si son molestos): disminucin del apetito cada del cabello cansancio Puede ser que esta lista no menciona todos los posibles efectos secundarios. Comunquese a su mdico por asesoramiento mdico [employer]. Usted puede informar los efectos secundarios a la FDA por telfono al 1-800-FDA-1088. Dnde debo guardar mi medicina? Este medicamento se administra en hospitales o clnicas, y no necesitar guardarlo en su domicilio. ATENCIN: Este folleto es un resumen. Puede ser que no cubra toda la posible informacin. Si usted tiene preguntas acerca de esta medicina, consulte con su mdico, su farmacutico o su profesional de Technical sales engineer.  2022 Elsevier/Gold Standard (2019-05-05 00:00:00)

## 2021-02-20 NOTE — Progress Notes (Signed)
[facility] CSW Progress Note  [employer] representative met with patient to discuss concerns over his financial assistance.  CSW referred patient to [employer], who spoke with the patient and assisted with charity application. Patient stated he had not other concerns or needs. CSW gave patient contact information and encouraged patient to contact CSW of he has any other needs or concerns.  Patient verbalized understanding.Adelene Amas , LCSW

## 2021-03-06 ENCOUNTER — Ambulatory Visit
Admission: RE | Admit: 2021-03-06 | Discharge: 2021-03-06 | Disposition: A | Discharge status: Home / Self Care | Payer: Self-pay | Source: Ambulatory Visit | Attending: Oncology | Admitting: Oncology

## 2021-03-06 DIAGNOSIS — C642 Malignant neoplasm of left kidney, except renal pelvis: Secondary | ICD-10-CM | POA: Insufficient documentation

## 2021-03-12 ENCOUNTER — Other Ambulatory Visit
Admission: RE | Admit: 2021-03-12 | Discharge: 2021-03-12 | Disposition: A | Discharge status: Home / Self Care | Payer: Self-pay | Attending: Nephrology | Admitting: Nephrology

## 2021-03-12 ENCOUNTER — Other Ambulatory Visit: Payer: Self-pay

## 2021-03-12 DIAGNOSIS — N1832 Chronic kidney disease, stage 3b: Secondary | ICD-10-CM | POA: Insufficient documentation

## 2021-03-12 LAB — CBC WITH DIFFERENTIAL/PLATELET
Abs Immature Granulocytes: 0.1 10*3/uL — ABNORMAL HIGH (ref 0.00–0.07)
Basophils Absolute: 0.1 10*3/uL (ref 0.0–0.1)
Basophils Relative: 0 %
Eosinophils Absolute: 0.5 10*3/uL (ref 0.0–0.5)
Eosinophils Relative: 2 %
HCT: 39.3 % (ref 39.0–52.0)
Hemoglobin: 12.3 g/dL — ABNORMAL LOW (ref 13.0–17.0)
Immature Granulocytes: 1 %
Lymphocytes Relative: 8 %
Lymphs Abs: 1.7 10*3/uL (ref 0.7–4.0)
MCH: 26.1 pg (ref 26.0–34.0)
MCHC: 31.3 g/dL (ref 30.0–36.0)
MCV: 83.3 fL (ref 80.0–100.0)
Monocytes Absolute: 2.4 10*3/uL — ABNORMAL HIGH (ref 0.1–1.0)
Monocytes Relative: 12 %
Neutro Abs: 15.2 10*3/uL — ABNORMAL HIGH (ref 1.7–7.7)
Neutrophils Relative %: 77 %
Platelets: 178 10*3/uL (ref 150–400)
RBC: 4.72 MIL/uL (ref 4.22–5.81)
RDW: 15.1 % (ref 11.5–15.5)
WBC: 19.8 10*3/uL — ABNORMAL HIGH (ref 4.0–10.5)
nRBC: 0 % (ref 0.0–0.2)

## 2021-03-12 LAB — RENAL FUNCTION PANEL
Albumin: 3.9 g/dL (ref 3.5–5.0)
Anion gap: 8 (ref 5–15)
BUN: 46 mg/dL — ABNORMAL HIGH (ref 6–20)
CO2: 24 mmol/L (ref 22–32)
Calcium: 9 mg/dL (ref 8.9–10.3)
Chloride: 104 mmol/L (ref 98–111)
Creatinine, Ser: 2.24 mg/dL — ABNORMAL HIGH (ref 0.61–1.24)
GFR, Estimated: 33 mL/min — ABNORMAL LOW (ref >60)
Glucose, Bld: 115 mg/dL — ABNORMAL HIGH (ref 70–99)
Phosphorus: 4 mg/dL (ref 2.5–4.6)
Potassium: 5.1 mmol/L (ref 3.5–5.1)
Sodium: 136 mmol/L (ref 135–145)

## 2021-03-13 ENCOUNTER — Inpatient Hospital Stay: Payer: Self-pay

## 2021-03-13 ENCOUNTER — Inpatient Hospital Stay (HOSPITAL_BASED_OUTPATIENT_CLINIC_OR_DEPARTMENT_OTHER): Payer: Self-pay | Admitting: Oncology

## 2021-03-13 ENCOUNTER — Encounter: Payer: Self-pay | Admitting: Oncology

## 2021-03-13 ENCOUNTER — Inpatient Hospital Stay: Payer: Self-pay | Attending: Oncology

## 2021-03-13 VITALS — BP 107/84 | HR 69 | Temp 97.8°F | Resp 18 | Wt 163.7 lb

## 2021-03-13 DIAGNOSIS — I129 Hypertensive chronic kidney disease with stage 1 through stage 4 chronic kidney disease, or unspecified chronic kidney disease: Secondary | ICD-10-CM | POA: Insufficient documentation

## 2021-03-13 DIAGNOSIS — B690 Cysticercosis of central nervous system: Secondary | ICD-10-CM

## 2021-03-13 DIAGNOSIS — R7401 Elevation of levels of liver transaminase levels: Secondary | ICD-10-CM | POA: Insufficient documentation

## 2021-03-13 DIAGNOSIS — R739 Hyperglycemia, unspecified: Secondary | ICD-10-CM | POA: Insufficient documentation

## 2021-03-13 DIAGNOSIS — Z905 Acquired absence of kidney: Secondary | ICD-10-CM | POA: Insufficient documentation

## 2021-03-13 DIAGNOSIS — M879 Osteonecrosis, unspecified: Secondary | ICD-10-CM | POA: Insufficient documentation

## 2021-03-13 DIAGNOSIS — Z5112 Encounter for antineoplastic immunotherapy: Secondary | ICD-10-CM

## 2021-03-13 DIAGNOSIS — Z955 Presence of coronary angioplasty implant and graft: Secondary | ICD-10-CM | POA: Insufficient documentation

## 2021-03-13 DIAGNOSIS — I255 Ischemic cardiomyopathy: Secondary | ICD-10-CM | POA: Insufficient documentation

## 2021-03-13 DIAGNOSIS — N1832 Chronic kidney disease, stage 3b: Secondary | ICD-10-CM

## 2021-03-13 DIAGNOSIS — Z7901 Long term (current) use of anticoagulants: Secondary | ICD-10-CM | POA: Insufficient documentation

## 2021-03-13 DIAGNOSIS — C642 Malignant neoplasm of left kidney, except renal pelvis: Secondary | ICD-10-CM

## 2021-03-13 DIAGNOSIS — Z79899 Other long term (current) drug therapy: Secondary | ICD-10-CM | POA: Insufficient documentation

## 2021-03-13 DIAGNOSIS — I2510 Atherosclerotic heart disease of native coronary artery without angina pectoris: Secondary | ICD-10-CM | POA: Insufficient documentation

## 2021-03-13 DIAGNOSIS — Z7982 Long term (current) use of aspirin: Secondary | ICD-10-CM | POA: Insufficient documentation

## 2021-03-13 DIAGNOSIS — I5030 Unspecified diastolic (congestive) heart failure: Secondary | ICD-10-CM | POA: Insufficient documentation

## 2021-03-13 DIAGNOSIS — Z8679 Personal history of other diseases of the circulatory system: Secondary | ICD-10-CM

## 2021-03-13 DIAGNOSIS — R7989 Other specified abnormal findings of blood chemistry: Secondary | ICD-10-CM

## 2021-03-13 DIAGNOSIS — I7782 Antineutrophilic cytoplasmic antibody (ANCA) vasculitis: Secondary | ICD-10-CM | POA: Insufficient documentation

## 2021-03-13 LAB — COMPREHENSIVE METABOLIC PANEL
ALT: 59 U/L — ABNORMAL HIGH (ref 0–44)
AST: 48 U/L — ABNORMAL HIGH (ref 15–41)
Albumin: 4 g/dL (ref 3.5–5.0)
Alkaline Phosphatase: 133 U/L — ABNORMAL HIGH (ref 38–126)
Anion gap: 7 (ref 5–15)
BUN: 43 mg/dL — ABNORMAL HIGH (ref 6–20)
CO2: 26 mmol/L (ref 22–32)
Calcium: 9.1 mg/dL (ref 8.9–10.3)
Chloride: 104 mmol/L (ref 98–111)
Creatinine, Ser: 2.14 mg/dL — ABNORMAL HIGH (ref 0.61–1.24)
GFR, Estimated: 35 mL/min — ABNORMAL LOW (ref >60)
Glucose, Bld: 118 mg/dL — ABNORMAL HIGH (ref 70–99)
Potassium: 4.8 mmol/L (ref 3.5–5.1)
Sodium: 137 mmol/L (ref 135–145)
Total Bilirubin: 0.2 mg/dL — ABNORMAL LOW (ref 0.3–1.2)
Total Protein: 7.2 g/dL (ref 6.5–8.1)

## 2021-03-13 LAB — CBC WITH DIFFERENTIAL/PLATELET
Abs Immature Granulocytes: 0.04 10*3/uL (ref 0.00–0.07)
Basophils Absolute: 0 10*3/uL (ref 0.0–0.1)
Basophils Relative: 0 %
Eosinophils Absolute: 0.4 10*3/uL (ref 0.0–0.5)
Eosinophils Relative: 4 %
HCT: 42.6 % (ref 39.0–52.0)
Hemoglobin: 13.1 g/dL (ref 13.0–17.0)
Immature Granulocytes: 0 %
Lymphocytes Relative: 23 %
Lymphs Abs: 2.1 10*3/uL (ref 0.7–4.0)
MCH: 26.1 pg (ref 26.0–34.0)
MCHC: 30.8 g/dL (ref 30.0–36.0)
MCV: 84.9 fL (ref 80.0–100.0)
Monocytes Absolute: 1 10*3/uL (ref 0.1–1.0)
Monocytes Relative: 11 %
Neutro Abs: 5.5 10*3/uL (ref 1.7–7.7)
Neutrophils Relative %: 62 %
Platelets: 180 10*3/uL (ref 150–400)
RBC: 5.02 MIL/uL (ref 4.22–5.81)
RDW: 14.8 % (ref 11.5–15.5)
WBC: 9 10*3/uL (ref 4.0–10.5)
nRBC: 0 % (ref 0.0–0.2)

## 2021-03-13 LAB — MICROALBUMIN / CREATININE URINE RATIO
Creatinine, Urine: 94.8 mg/dL
Microalb Creat Ratio: <3 mg/g creat (ref 0–29)
Microalb, Ur: <3 ug/mL — ABNORMAL HIGH

## 2021-03-13 LAB — PTH, INTACT AND CALCIUM
Calcium, Total (PTH): 8.9 mg/dL (ref 8.7–10.2)
PTH: 55 pg/mL (ref 15–65)

## 2021-03-13 LAB — TSH: TSH: 2.913 u[IU]/mL (ref 0.350–4.500)

## 2021-03-13 LAB — T4, FREE: Free T4: 0.98 ng/dL (ref 0.61–1.12)

## 2021-03-13 MED ORDER — SODIUM CHLORIDE 0.9 % IV SOLN
200.0000 mg | Freq: Once | INTRAVENOUS | Status: AC
  Administered 2021-03-13: 200 mg via INTRAVENOUS
  Filled 2021-03-13: qty 200

## 2021-03-13 MED ORDER — SODIUM CHLORIDE 0.9 % IV SOLN
Freq: Once | INTRAVENOUS | Status: AC
  Filled 2021-03-13: qty 250

## 2021-03-13 NOTE — Progress Notes (Signed)
Nutrition Assessment ? ? ?Reason for Assessment:  ? ?Patient with questions regarding diet. ? ? ?ASSESSMENT:  ?61 year old male with metastatic renal cell carcinoma.  Past medical history of CKD stage III, left nephrectomy, HTN, HLD, CHF, CAD.  Patient receiving [place]. ? ?Met with patient during infusion with interpreter present.  Patient reports that his appetite is good. Denies any nutrition impact symptoms at this time.  This am ate bread and milk for breakfast.  Sometimes skips breakfast.  Lunch is usually beans, meat (chicken), salsa, corn tortillas. Supper is similar to lunch.  Likes potatoes, squash, cucumbers, lettuce, and fruits.   ? ? ?Medications: lasix ? ? ?Labs: reviewed ? ? ?Anthropometrics:  ? ?Height: 63 inches ?Weight: 163 lb 11.2 oz ?156 lb on 11/1 ?155 lb on 10/11 ?156 lb on 8/30 ?151 lb on 7/26 ?BMI: 28 ? ? ?NUTRITION DIAGNOSIS: Food and nutrition related knowledge deficit related to cancer as evidenced regarding questions about foods he can eat.  ? ? ?INTERVENTION:  ?Discussed importance of nutrition and weight maintenance during treatment.   ?Reviewed diet recommendations for CKD and spanish handout on CKD Nutrition Therapy from AND given. ?Contact information provided ? ? ?MONITORING, EVALUATION, GOAL: weight trends, intake ? ? ?Next Visit: Wednesday, March 22 during infusion ? ?Mark Donaldson, RD, LDN ?Registered Dietitian ?[phone] (mobile) ? ? ? ? ? ?

## 2021-03-13 NOTE — Progress Notes (Signed)
Hematology/Oncology Progress Note [phone] [phone] Patient Care Team: Center, [facility] as PCP - General (General Practice)   Name of the patient: Mark Donaldson  [id number]  1960/01/18  Date of visit: 03/13/21   INTERVAL HISTORY-  61 y.o. male presents for follow-up of renal cell carcinoma treatments. Patient has past medical history CAD status post PCI to LAD in 2018, on aspirin and Plavix, CHF, history of focal sclerosing/crescentic GN-ANCA positive and CKD stage III. He was hospitalized from 05/02/2019-05/07/2019 due to gross hematuria. CT showed a 7.4 cm left lower pole renal mass concerning for RCC.  Patient also has necrotic lymphadenopathy in the mediastinum and the right supra hilar region which were highly suspicious for metastatic disease.  Bilateral lung nodules, also concerning for metastatic lung disease.  Right retrocrural lymphadenopathy with upper normal left periaortic lymph node. Patient was recommended for cytoreductive radical nephrectomy.  With history of ANCA positive vasculitis, also recommend thoracic lymphadenopathy biopsy to confirm distal metastasis.  Patient agreed to radical nephrectomy and declined bronchoscopy biopsy. He underwent left radical nephrectomy on 05/05/2019. Pathology showed pT3a pNx, RCC, conventional clear cell type, grade 3 Patient was discharged home. Today he presents to discuss pathology and the management plan. He was accompanied by his wife and daughter. Patient reports that he uses hydrocodone for pain.  Pain around the surgical site has improved.  He occasionally has tingling sensation around the surgical site.  Denies any fever or chills, drainage from the surgical site.  # Interval CT chest abdomen pelvis was independently reviewed by me and discussed with patient. 01/12/2020, CT showed overall marked improvement.  Comparing to CT scan in August 2021, continues to have treatment  response.Mediastinal soft tissue nodule nearly completely resolved, right minor fissure nodule 8 mm, decreased in size.  Resolution of pleural fluid and thickening of the right chest.  Small pleural effusion in the left chest.  No new lung nodules.-Subtle variation of the muscle architecture of the left psoas muscle of unknown significance.-Continue observation.  He is asymptomatic. Subtle nodule in the soft tissue of the left flank.  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 He finished 14 days of albendazole plus praziquantel .   He has had few side effects including poor appetite, hair loss, weight loss, bloody stool, abdominal cramping or diarrhea. All symptoms have improved since he completed the treatment. 03/16/2020 he finished tapering course of steroids  # It has been noted that his glucose level has been elevated in December 2021, even before he was started on prednisone treatments. Dr. Steva Ready has obtained A1c which came back elevated at 8.7.  # 04/30/2020, CT chest abdomen pelvis without contrast showed stable disease.   #04/30/2020, axitinib and immunotherapy were held due to diarrhea and fatigue. #05/15/2020 resumed on immunotherapy. 07/31/2020, CT chest abdomen pelvis showed stable disease 09/11/2020 resumed axitinib 5 mg twice daily, stopped on 10/23/2020 due to diarrhea. Continued on Keytruda treatments.  #Intermittent orthopnea, symptom has resolved.  Continue Lasix Pre-existing diastolic CHF, 02/14/5425 LVEF 60 to 65%.   Recommend him to follow-up with cardiology-he is has not able to follow-up with cardiology due to lack of insurance coverage.  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  Patient has been referred to orthopedic surgeon in [place] which he is not able to go due to transportation.  He does not have insurance as well.  He is currently asymptomatic.  Monitor.  11/28/2020, CT abdomen pelvis without contrast showed stable examination.  No  evidence of new or progressive disease in the chest abdomen pelvis.  Stable to minimally decreased size of 5 mm pulmonary nodule in the minor fissure in the right chest.  Sigmoid colon diverticulosis without findings of acute diverticulitis.  Continued on immunotherapy with Keytruda every 3 weeks 01/17/2021 Beryle Flock was held due to transaminitis, immunotherapy induced liver toxicities. 02/20/2021, resumed Keytruda.  INTERVAL HISTORY Mark Donaldson is a 61 y.o. male who has above history reviewed by me today presents for follow up visit for management of metastatic RCC  Patient is Spanish-speaking.   Spanish interpreter presented the entire encounter for translation He reports feeling well. No new complaints.  During interval has had a CT scan done.    Review of systems- Review of Systems  Constitutional:  Negative for appetite change, chills, fatigue, fever and unexpected weight change.  HENT:   Negative for voice change.   Eyes:  Negative for eye problems and icterus.  Respiratory:  Negative for chest tightness and cough.   Cardiovascular:  Negative for chest pain and leg swelling.  Gastrointestinal:  Negative for abdominal distention, abdominal pain and diarrhea.  Endocrine: Negative for hot flashes.  Genitourinary:  Negative for difficulty urinating, dysuria and frequency.   Musculoskeletal:  Positive for arthralgias.  Skin:  Negative for itching and rash.  Neurological:  Negative for light-headedness and numbness.  Hematological:  Negative for adenopathy. Does not bruise/bleed easily.  Psychiatric/Behavioral:  Negative for confusion.    No Known Allergies  Patient Active Problem List   Diagnosis Date Noted   Transaminitis 01/17/2021   History of CHF (congestive heart failure) 05/01/2020   Hyperglycemia 02/28/2020   Stage 3b chronic kidney disease ([place]) 10/04/2019   Brain lesion 10/04/2019   Encounter for antineoplastic immunotherapy 07/19/2019   Encounter for  antineoplastic chemotherapy 07/19/2019   CNS lesion 07/07/2019   Anemia in stage 3b chronic kidney disease ([place]) 06/07/2019   Goals of care, counseling/discussion 05/23/2019   Renal cell carcinoma of left kidney ([place]) 05/23/2019   Renal cell carcinoma ([place]) 05/23/2019   Palliative care encounter    History of vasculitis    Thoracic lymphadenopathy    Lung nodule    CAD S/P percutaneous coronary angioplasty 05/02/2019   Kidney mass 05/02/2019   Hydronephrosis of left kidney 05/02/2019   UTI (urinary tract infection) 05/02/2019   AKI (acute kidney injury) ([place]) 05/02/2019   Gross hematuria 05/02/2019   Chronic diastolic CHF (congestive heart failure) ([place]) 05/02/2019   Sepsis ([place]) 05/02/2019   Severe sepsis ([place]) 05/02/2019   Preop cardiovascular exam    Hyperlipidemia 05/21/2016   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 ([place]) 05/21/2016   Prediabetes 05/21/2016   Alcohol abuse 05/18/2016   Essential hypertension 05/18/2016   Vasculitis, ANCA positive 05/18/2016   Shortness of breath 05/17/2016     Past Medical History:  Diagnosis Date   ANCA-positive vasculitis    CAD (coronary artery disease)    Essential hypertension    HFrEF (heart failure with reduced ejection fraction) (HCC)    Hyperlipidemia LDL goal <70    Ischemic cardiomyopathy    Renal cell carcinoma ([place]) 05/23/2019   Renal disorder      Past Surgical History:  Procedure Laterality Date   CARDIAC SURGERY     cardiac cath with 2 stent placement   LAPAROSCOPIC NEPHRECTOMY, HAND ASSISTED Left 05/05/2019   Procedure: HAND ASSISTED LAPAROSCOPIC NEPHRECTOMY;  Surgeon: Hollice Espy, MD;  Location: [facility] ORS;  Service: Urology;  Laterality: Left;  Social History   Socioeconomic History   Marital status: Married    Spouse name: Not on file   Number of children: Not on file   Years of education: Not on file   Highest education level: Not on file  Occupational History   Not on file  Tobacco  Use   Smoking status: Never   Smokeless tobacco: Never  Substance and Sexual Activity   Alcohol use: Yes   Drug use: Not on file   Sexual activity: Not on file  Other Topics Concern   Not on file  Social History Narrative   Not on file   Social Determinants of Health   Financial Resource Strain: Not on file  Food Insecurity: Not on file  Transportation Needs: Not on file  Physical Activity: Not on file  Stress: Not on file  Social Connections: Not on file  Intimate Partner Violence: Not on file     Family History  Problem Relation Age of Onset   Cancer Mother    Blindness Brother      Current Outpatient Medications:    aspirin 81 MG EC tablet, Take 81 mg by mouth daily. Swallow whole., Disp: , Rfl:    atorvastatin (LIPITOR) 80 MG tablet, Take 80 mg by mouth daily., Disp: , Rfl:    clopidogrel (PLAVIX) 75 MG tablet, Take 75 mg by mouth daily., Disp: , Rfl:    furosemide (LASIX) 20 MG tablet, Take 1 tablet (20 mg total) by mouth daily., Disp: 30 tablet, Rfl: 3   KEYTRUDA 100 MG/4ML SOLN, INJECT 200MG  INTRAVENOUSLY EVERY 3 WEEKS, Disp: 8 mL, Rfl: 11   lisinopril (ZESTRIL) 10 MG tablet, Take 10 mg by mouth daily., Disp: , Rfl:    metoprolol succinate (TOPROL-XL) 100 MG 24 hr tablet, Take 1.5 tablets by mouth daily., Disp: , Rfl:    loperamide (IMODIUM) 2 MG capsule, Take 1 capsule (2 mg total) by mouth See admin instructions. Take 2 tablets with onset of diarrhea, then 1 tablet for every 2 hours until diarrhea stops. Maximum 8 tablets per 24 hours. (Patient not taking: Reported on 11/13/2020), Disp: 60 capsule, Rfl: 2   Physical exam:  Vitals:   03/13/21 0911  BP: 107/84  Pulse: 69  Resp: 18  Temp: 97.8 F (36.6 C)  Weight: 163 lb 11.2 oz (74.3 kg)    Physical Exam Constitutional:      General: He is not in acute distress.    Appearance: He is not diaphoretic.  HENT:     Head: Normocephalic and atraumatic.     Nose: Nose normal.     Mouth/Throat:     Pharynx: No  oropharyngeal exudate.  Eyes:     General: No scleral icterus.    Pupils: Pupils are equal, round, and reactive to light.  Cardiovascular:     Rate and Rhythm: Normal rate and regular rhythm.     Heart sounds: No murmur heard. Pulmonary:     Effort: Pulmonary effort is normal. No respiratory distress.     Breath sounds: No rales.  Chest:     Chest wall: No tenderness.  Abdominal:     General: There is no distension.     Palpations: Abdomen is soft.     Tenderness: There is no abdominal tenderness.  Musculoskeletal:        General: Normal range of motion.     Cervical back: Normal range of motion and neck supple.     Comments: Right shoulder range of motion limited  due to pain.  Skin:    General: Skin is warm and dry.     Findings: No erythema.  Neurological:     Mental Status: He is alert and oriented to person, place, and time.     Cranial Nerves: No cranial nerve deficit.     Motor: No abnormal muscle tone.     Coordination: Coordination normal.  Psychiatric:        Mood and Affect: Affect normal.       CMP Latest Ref Rng & Units 03/13/2021  Glucose 70 - 99 mg/dL 118(H)  BUN 6 - 20 mg/dL 43(H)  Creatinine 0.61 - 1.24 mg/dL 2.14(H)  Sodium 135 - 145 mmol/L 137  Potassium 3.5 - 5.1 mmol/L 4.8  Chloride 98 - 111 mmol/L 104  CO2 22 - 32 mmol/L 26  Calcium 8.9 - 10.3 mg/dL 9.1  Total Protein 6.5 - 8.1 g/dL 7.2  Total Bilirubin 0.3 - 1.2 mg/dL 0.2(L)  Alkaline Phos 38 - 126 U/L 133(H)  AST 15 - 41 U/L 48(H)  ALT 0 - 44 U/L 59(H)   CBC Latest Ref Rng & Units 03/13/2021  WBC 4.0 - 10.5 K/uL 9.0  Hemoglobin 13.0 - 17.0 g/dL 13.1  Hematocrit 39.0 - 52.0 % 42.6  Platelets 150 - 400 K/uL 180    RADIOGRAPHIC STUDIES: I have personally reviewed the radiological images as listed and agreed with the findings in the report. CT CHEST ABDOMEN PELVIS WO CONTRAST  Result Date: 03/08/2021 CLINICAL DATA:  61 year old male with history of renal cell carcinoma of the left kidney.  Follow-up evaluation. EXAM: CT ABDOMEN AND PELVIS WITHOUT CONTRAST TECHNIQUE: Multidetector CT imaging of the abdomen and pelvis was performed following the standard protocol without IV contrast. RADIATION DOSE REDUCTION: This exam was performed according to the departmental dose-optimization program which includes automated exposure control, adjustment of the mA and/or kV according to patient size and/or use of iterative reconstruction technique. COMPARISON:  CT of the chest, abdomen and pelvis 11/28/2020. FINDINGS: CT ABDOMEN PELVIS FINDINGS Lower chest: Atherosclerotic calcifications in the left anterior descending, left circumflex and right coronary arteries. Hepatobiliary: No definite suspicious cystic or solid hepatic lesions are confidently identified on today's noncontrast CT examination. Unenhanced appearance of the gallbladder is unremarkable. Pancreas: No definite pancreatic mass or peripancreatic fluid collections or inflammatory changes are noted on today's noncontrast CT examination. Spleen: Unremarkable. Adrenals/Urinary Tract: Status post left radical nephrectomy. No unexpected soft tissue mass in the nephrectomy bed to suggest locally recurrent disease. Unenhanced appearance of the right kidney, bilateral adrenal glands and urinary bladder is unremarkable. Stomach/Bowel: Unenhanced appearance of the stomach is normal. No pathologic dilatation of small bowel or colon. A few scattered colonic diverticulae are noted, particularly in the descending colon and sigmoid colon, without surrounding inflammatory changes to suggest an acute diverticulitis at this time. Normal appendix. Vascular/Lymphatic: No atherosclerotic calcifications are noted in the abdominal aorta or pelvic vasculature. No lymphadenopathy noted in the abdomen or pelvis. Reproductive: Prostate gland and seminal vesicles are unremarkable in appearance. Other: No significant volume of ascites.  No pneumoperitoneum. Musculoskeletal: There  are no aggressive appearing lytic or blastic lesions noted in the visualized portions of the skeleton. IMPRESSION: 1. Status post left radical nephrectomy. No findings to suggest locally recurrent disease or definite metastatic disease in the abdomen or pelvis. 2. Colonic diverticulosis without evidence of acute diverticulitis at this time. 3. Aortic atherosclerosis, in addition to three-vessel coronary artery disease. Please note that although the presence of coronary artery calcium  documents the presence of coronary artery disease, the severity of this disease and any potential stenosis cannot be assessed on this non-gated CT examination. Assessment for potential risk factor modification, dietary therapy or pharmacologic therapy may be warranted, if clinically indicated. Electronically Signed   By: Vinnie Langton M.D.   On: 03/08/2021 07:06   US Abdomen Limited RUQ (LIVER/GB)  Result Date: 01/21/2021 CLINICAL DATA:  Transaminitis. EXAM: ULTRASOUND ABDOMEN LIMITED RIGHT UPPER QUADRANT COMPARISON:  CT chest abdomen pelvis 11/28/2020. FINDINGS: Gallbladder: No gallstones or wall thickening visualized. No sonographic Murphy sign noted by sonographer. Common bile duct: Diameter: 2 mm, within normal limits. Liver: Diffusely increased in echogenicity. No focal lesion. Portal vein is patent on color Doppler imaging with normal direction of blood flow towards the liver. Other: None. IMPRESSION: Hepatic steatosis. Electronically Signed   By: Lorin Picket M.D.   On: 01/21/2021 11:44     Assessment and plan-  Patient is a 61 y.o. male history of CAD, status post stent, CHF, history of ANCA positive sclerosing/crescentic GN, chronic kidney disease stage III presents for treatments of stage IV renal cell carcinoma 1. Renal cell carcinoma of left kidney (HCC)   2. Encounter for antineoplastic immunotherapy   3. Elevated LFTs   4. Neurocysticercosis   5. History of CHF (congestive heart failure)   6. Stage 3b  chronic kidney disease ([place])    Cancer Staging  Renal cell carcinoma of left kidney ([place]) Staging form: Kidney, AJCC 8th Edition - Pathologic: Stage IV (pT3a, pNX, cM1) - Signed by Earlie Server, MD on 05/23/2019   # Stage IV renal cell carcinoma Labs are reviewed and discussed with patient.  Continue Keytruda treatment every 3 weeks.  Proceed today. 03/08/2021, CT scan showed NED  Transaminitis, possible immunotherapy side effects, or due to being on statin.  Liver enzymes are stable, mildly elevated.  Proceed with treatment today.  Avoid alcohol.  #Brain lesion-  neurocysticercosis finished therapy.  Follow-up with ID There was a new lesion.  Per discussion with Dr.Ravishanka, she recommends no additional treatments at this point.  Continue monitor.  Future biopsy could be considered if lesion is increasing in size.  #CKD, avoid nephrotoxins. Encourage oral hydration.   Spanish interpreter presented for the entire encounter for translation. We spent sufficient time to discuss many aspect of care, questions were answered to patient's satisfaction.  Follow up in 3 weeks.   Earlie Server, MD, PhD  03/13/2021

## 2021-03-13 NOTE — Progress Notes (Signed)
Patient here for follow up. No new concerns reported. He states that he has questions regarding his financial assistance. Sharee Holster, SW has been notified and will communicate this to financial navigator.  ?

## 2021-04-03 ENCOUNTER — Other Ambulatory Visit: Payer: Self-pay

## 2021-04-03 ENCOUNTER — Inpatient Hospital Stay: Payer: Self-pay

## 2021-04-03 ENCOUNTER — Encounter: Payer: Self-pay | Admitting: Oncology

## 2021-04-03 ENCOUNTER — Inpatient Hospital Stay (HOSPITAL_BASED_OUTPATIENT_CLINIC_OR_DEPARTMENT_OTHER): Payer: Self-pay | Admitting: Oncology

## 2021-04-03 VITALS — BP 124/78 | HR 64 | Temp 96.9°F | Resp 16 | Wt 163.1 lb

## 2021-04-03 DIAGNOSIS — C642 Malignant neoplasm of left kidney, except renal pelvis: Secondary | ICD-10-CM

## 2021-04-03 DIAGNOSIS — N1832 Chronic kidney disease, stage 3b: Secondary | ICD-10-CM

## 2021-04-03 DIAGNOSIS — R7989 Other specified abnormal findings of blood chemistry: Secondary | ICD-10-CM

## 2021-04-03 DIAGNOSIS — Z5112 Encounter for antineoplastic immunotherapy: Secondary | ICD-10-CM

## 2021-04-03 DIAGNOSIS — Z8679 Personal history of other diseases of the circulatory system: Secondary | ICD-10-CM

## 2021-04-03 LAB — CBC WITH DIFFERENTIAL/PLATELET
Abs Immature Granulocytes: 0.03 10*3/uL (ref 0.00–0.07)
Basophils Absolute: 0.1 10*3/uL (ref 0.0–0.1)
Basophils Relative: 1 %
Eosinophils Absolute: 0.5 10*3/uL (ref 0.0–0.5)
Eosinophils Relative: 5 %
HCT: 40 % (ref 39.0–52.0)
Hemoglobin: 12.5 g/dL — ABNORMAL LOW (ref 13.0–17.0)
Immature Granulocytes: 0 %
Lymphocytes Relative: 24 %
Lymphs Abs: 2 10*3/uL (ref 0.7–4.0)
MCH: 25.6 pg — ABNORMAL LOW (ref 26.0–34.0)
MCHC: 31.3 g/dL (ref 30.0–36.0)
MCV: 82 fL (ref 80.0–100.0)
Monocytes Absolute: 0.9 10*3/uL (ref 0.1–1.0)
Monocytes Relative: 11 %
Neutro Abs: 5.1 10*3/uL (ref 1.7–7.7)
Neutrophils Relative %: 59 %
Platelets: 162 10*3/uL (ref 150–400)
RBC: 4.88 MIL/uL (ref 4.22–5.81)
RDW: 14.8 % (ref 11.5–15.5)
WBC: 8.6 10*3/uL (ref 4.0–10.5)
nRBC: 0 % (ref 0.0–0.2)

## 2021-04-03 LAB — COMPREHENSIVE METABOLIC PANEL
ALT: 30 U/L (ref 0–44)
AST: 32 U/L (ref 15–41)
Albumin: 4 g/dL (ref 3.5–5.0)
Alkaline Phosphatase: 99 U/L (ref 38–126)
Anion gap: 8 (ref 5–15)
BUN: 39 mg/dL — ABNORMAL HIGH (ref 6–20)
CO2: 25 mmol/L (ref 22–32)
Calcium: 8.9 mg/dL (ref 8.9–10.3)
Chloride: 101 mmol/L (ref 98–111)
Creatinine, Ser: 2 mg/dL — ABNORMAL HIGH (ref 0.61–1.24)
GFR, Estimated: 38 mL/min — ABNORMAL LOW (ref >60)
Glucose, Bld: 112 mg/dL — ABNORMAL HIGH (ref 70–99)
Potassium: 4.8 mmol/L (ref 3.5–5.1)
Sodium: 134 mmol/L — ABNORMAL LOW (ref 135–145)
Total Bilirubin: 0.7 mg/dL (ref 0.3–1.2)
Total Protein: 7 g/dL (ref 6.5–8.1)

## 2021-04-03 MED ORDER — SODIUM CHLORIDE 0.9 % IV SOLN
200.0000 mg | Freq: Once | INTRAVENOUS | Status: AC
  Administered 2021-04-03: 200 mg via INTRAVENOUS
  Filled 2021-04-03: qty 200

## 2021-04-03 MED ORDER — SODIUM CHLORIDE 0.9 % IV SOLN
Freq: Once | INTRAVENOUS | Status: AC
  Filled 2021-04-03: qty 250

## 2021-04-03 NOTE — Progress Notes (Signed)
Patient here for follow up. No new concerns voiced.  °

## 2021-04-03 NOTE — Progress Notes (Signed)
? ?Hematology/Oncology Progress Note ?[phone] [phone] ?Patient Care Team: ?[facility] as [facility] - General (General Practice)  ? ?Name of the patient: Mark Donaldson  ?[id number]  ?[birth year]  ?Date of visit: 04/03/21 ? ? ?PERTINENT HISTORY-  ?61 y.o. male presents for follow-up of renal cell carcinoma treatments. ?Patient has past medical history CAD status post PCI to LAD in 2018, on aspirin and Plavix, CHF, history of focal sclerosing/crescentic GN-ANCA positive and CKD stage III. ?He was hospitalized from 05/02/2019-05/07/2019 due to gross hematuria. ?CT showed a 7.4 cm left lower pole renal mass concerning for RCC.  Patient also has necrotic lymphadenopathy in the mediastinum and the right supra hilar region which were highly suspicious for metastatic disease.  Bilateral lung nodules, also concerning for metastatic lung disease.  Right retrocrural lymphadenopathy with upper normal left periaortic lymph node. ?Patient was recommended for cytoreductive radical nephrectomy.  With history of ANCA positive vasculitis, also recommend thoracic lymphadenopathy biopsy to confirm distal metastasis.  Patient agreed to radical nephrectomy and declined bronchoscopy biopsy. ?He underwent left radical nephrectomy on 05/05/2019. ?Pathology showed pT3a pNx, RCC, conventional clear cell type, grade 3 ?Patient was discharged home. ?Today he presents to discuss pathology and the management plan. ?He was accompanied by his wife and daughter. ?Patient reports that he uses hydrocodone for pain.  Pain around the surgical site has improved.  He occasionally has tingling sensation around the surgical site.  Denies any fever or chills, drainage from the surgical site. ? ?# Interval CT chest abdomen pelvis was independently reviewed by me and discussed with patient. ?01/12/2020, CT showed overall marked improvement.  Comparing to CT scan in August 2021, continues to have treatment  response.Mediastinal soft tissue nodule nearly completely resolved, right minor fissure nodule 8 mm, decreased in size.  Resolution of pleural fluid and thickening of the right chest.  Small pleural effusion in the left chest.  No new lung nodules.-Subtle variation of the muscle architecture of the left psoas muscle of unknown significance.-Continue observation.  He is asymptomatic. ?Subtle nodule in the soft tissue of the left flank. ? 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 He finished 14 days of albendazole plus praziquantel .   ?He has had few side effects including poor appetite, hair loss, weight loss, bloody stool, abdominal cramping or diarrhea. All symptoms have improved since he completed the treatment. 03/16/2020 he finished tapering course of steroids ? ?# It has been noted that his glucose level has been elevated in December 2021, even before he was started on prednisone treatments. Dr. Steva Ready has obtained A1c which came back elevated at 8.7. ? ?# 04/30/2020, CT chest abdomen pelvis without contrast showed stable disease.   ?#04/30/2020, axitinib and immunotherapy were held due to diarrhea and fatigue. ?#05/15/2020 resumed on immunotherapy. ?07/31/2020, CT chest abdomen pelvis showed stable disease ?09/11/2020 resumed axitinib 5 mg twice daily, stopped on 10/23/2020 due to diarrhea. ?Continued on Keytruda treatments. ? ?#Intermittent orthopnea, symptom has resolved.  Continue Lasix ?Pre-existing diastolic CHF, 6/71/2458 LVEF 60 to 65%.   ?Recommend him to follow-up with cardiology-he is has not able to follow-up with cardiology due to lack of insurance coverage. ? 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  Patient has been referred to orthopedic surgeon in [place] which he is not able to go due to transportation.  He does not have insurance as well.  He is currently asymptomatic.  Monitor. ? ?11/28/2020, CT abdomen pelvis without contrast showed stable examination.  No  evidence of new or progressive disease in the chest abdomen pelvis.  Stable to minimally decreased size of 5 mm pulmonary nodule in the minor fissure in the right chest.  Sigmoid colon diverticulosis without findings of acute diverticulitis. ? ?Continued on immunotherapy with Keytruda every 3 weeks ?01/17/2021 Keytruda was held due to transaminitis, immunotherapy induced liver toxicities. ?02/20/2021, resumed Keytruda. Axitinib continues to be held.  ?03/08/2021, CT scan showed NED ? ?INTERVAL HISTORY ?Mark Donaldson is a 61 y.o. male who has above history reviewed by me today presents for follow up visit for management of metastatic RCC ? ?Patient is Spanish-speaking.   Spanish interpreter presented the entire encounter for translation ?Patient reports feeling well.  No new complaints.  He is interested to going back to work and request a letter to clear him to go back to work with heavy lifting restrictions. ? ? ? ?Review of systems- Review of Systems  ?Constitutional:  Negative for appetite change, chills, fatigue, fever and unexpected weight change.  ?HENT:   Negative for voice change.   ?Eyes:  Negative for eye problems and icterus.  ?Respiratory:  Negative for chest tightness and cough.   ?Cardiovascular:  Negative for chest pain and leg swelling.  ?Gastrointestinal:  Negative for abdominal distention, abdominal pain and diarrhea.  ?Endocrine: Negative for hot flashes.  ?Genitourinary:  Negative for difficulty urinating, dysuria and frequency.   ?Musculoskeletal:  Positive for arthralgias.  ?Skin:  Negative for itching and rash.  ?Neurological:  Negative for light-headedness and numbness.  ?Hematological:  Negative for adenopathy. Does not bruise/bleed easily.  ?Psychiatric/Behavioral:  Negative for confusion.   ? ?No Known Allergies ? ?Patient Active Problem List  ? Diagnosis Date Noted  ? Transaminitis 01/17/2021  ? History of CHF (congestive heart failure) 05/01/2020  ? Hyperglycemia 02/28/2020  ?  Stage 3b chronic kidney disease ([place]) 10/04/2019  ? Brain lesion 10/04/2019  ? Encounter for antineoplastic immunotherapy 07/19/2019  ? Encounter for antineoplastic chemotherapy 07/19/2019  ? CNS lesion 07/07/2019  ? Anemia in stage 3b chronic kidney disease ([place]) 06/07/2019  ? Goals of care, counseling/discussion 05/23/2019  ? Renal cell carcinoma of left kidney ([place]) 05/23/2019  ? Renal cell carcinoma ([place]) 05/23/2019  ? Palliative care encounter   ? History of vasculitis   ? Thoracic lymphadenopathy   ? Lung nodule   ? CAD S/P percutaneous coronary angioplasty 05/02/2019  ? Kidney mass 05/02/2019  ? Hydronephrosis of left kidney 05/02/2019  ? UTI (urinary tract infection) 05/02/2019  ? AKI (acute kidney injury) ([place]) 05/02/2019  ? Gross hematuria 05/02/2019  ? Chronic diastolic CHF (congestive heart failure) ([place]) 05/02/2019  ? Sepsis ([place]) 05/02/2019  ? Severe sepsis ([place]) 05/02/2019  ? Preop cardiovascular exam   ? Hyperlipidemia 05/21/2016  ? 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 Prediabetes 05/21/2016  ? Alcohol abuse 05/18/2016  ? Essential hypertension 05/18/2016  ? Vasculitis, ANCA positive 05/18/2016  ? Shortness of breath 05/17/2016  ? ? ? ?Past Medical History:  ?Diagnosis Date  ? ANCA-positive vasculitis   ? CAD (coronary artery disease)   ? Essential hypertension   ? HFrEF (heart failure with reduced ejection fraction) ([place])   ? Hyperlipidemia LDL goal <70   ? Ischemic cardiomyopathy   ? Renal cell carcinoma ([place]) 05/23/2019  ? Renal disorder   ? ? ? ?Past Surgical History:  ?Procedure Laterality Date  ? CARDIAC SURGERY    ? cardiac cath with 2 stent placement  ? LAPAROSCOPIC NEPHRECTOMY, HAND  ASSISTED Left 05/05/2019  ? Procedure: HAND ASSISTED LAPAROSCOPIC NEPHRECTOMY;  Surgeon: Hollice Espy, MD;  Location: [facility] ORS;  Service: Urology;  Laterality: Left;  ? ? ?Social History  ? ?Socioeconomic History  ? Marital status: Married  ?  Spouse name: Not on file  ? Number  of children: Not on file  ? Years of education: Not on file  ? Highest education level: Not on file  ?Occupational History  ? Not on file  ?Tobacco Use  ? Smoking status: Never  ? Smokeless tobacco: Never  ?Substance

## 2021-04-03 NOTE — Progress Notes (Signed)
Nutrition Follow-up: ? ?Patient with metastatic renal cell carcinoma.  Patient receiving [place]. ? ?Met with patient during infusion.  Utilized Audiological scientist, Costella Hatcher # [phone] was interpreter during all of visit.  Patient reports good appetite.  Has never been much for eating breakfast.  Sometimes will have milk and cookies.  Lunch is traditional foods, soups, beans, corn tortillas, fruit.  Evening meal more traditional foods.  Sometimes may not be hungry and will drink coffee and cookies.   ? ? ? ?Medications: reviewed ? ?Labs: reviewed ? ?Anthropometrics:  ? ?Weight 163 lb 1.6 oz today, stable ? ?163 lb 11.2 oz on 3/1 ?156 lb on 11/1 ?155 lb on 10/11 ?156 lb on 8/30 ?151 lb on 7/26 ? ? ?NUTRITION DIAGNOSIS: Food and nutrition related knowledge deficit resolved. ? ? ?INTERVENTION:  ?Encouraged good intake including protein foods.  Examples provided ?Contact information given at last visit. Encouraged patient to reach out with questions or concerns.   ?Currently denies any concerns at this time ? ? ? ?NEXT VISIT: no follow-up ?RD available as needed ?Tabytha Gradillas B. Zenia Resides, RD, LDN ?Registered Dietitian ?[phone] ? ? ?

## 2021-04-24 ENCOUNTER — Inpatient Hospital Stay (HOSPITAL_BASED_OUTPATIENT_CLINIC_OR_DEPARTMENT_OTHER): Payer: Self-pay | Admitting: Oncology

## 2021-04-24 ENCOUNTER — Inpatient Hospital Stay: Payer: Self-pay

## 2021-04-24 ENCOUNTER — Inpatient Hospital Stay: Payer: Self-pay | Attending: Oncology

## 2021-04-24 ENCOUNTER — Encounter: Payer: Self-pay | Admitting: Oncology

## 2021-04-24 VITALS — BP 98/72 | HR 67 | Temp 96.8°F | Resp 18

## 2021-04-24 VITALS — BP 112/73 | HR 70 | Temp 97.1°F | Resp 18 | Wt 162.0 lb

## 2021-04-24 DIAGNOSIS — Z905 Acquired absence of kidney: Secondary | ICD-10-CM | POA: Insufficient documentation

## 2021-04-24 DIAGNOSIS — I130 Hypertensive heart and chronic kidney disease with heart failure and stage 1 through stage 4 chronic kidney disease, or unspecified chronic kidney disease: Secondary | ICD-10-CM | POA: Insufficient documentation

## 2021-04-24 DIAGNOSIS — N1832 Chronic kidney disease, stage 3b: Secondary | ICD-10-CM | POA: Insufficient documentation

## 2021-04-24 DIAGNOSIS — B690 Cysticercosis of central nervous system: Secondary | ICD-10-CM | POA: Insufficient documentation

## 2021-04-24 DIAGNOSIS — C642 Malignant neoplasm of left kidney, except renal pelvis: Secondary | ICD-10-CM

## 2021-04-24 DIAGNOSIS — Z79899 Other long term (current) drug therapy: Secondary | ICD-10-CM | POA: Insufficient documentation

## 2021-04-24 DIAGNOSIS — D631 Anemia in chronic kidney disease: Secondary | ICD-10-CM | POA: Insufficient documentation

## 2021-04-24 DIAGNOSIS — Z5112 Encounter for antineoplastic immunotherapy: Secondary | ICD-10-CM

## 2021-04-24 DIAGNOSIS — Z7982 Long term (current) use of aspirin: Secondary | ICD-10-CM | POA: Insufficient documentation

## 2021-04-24 DIAGNOSIS — R7401 Elevation of levels of liver transaminase levels: Secondary | ICD-10-CM | POA: Insufficient documentation

## 2021-04-24 DIAGNOSIS — I5042 Chronic combined systolic (congestive) and diastolic (congestive) heart failure: Secondary | ICD-10-CM | POA: Insufficient documentation

## 2021-04-24 LAB — CBC WITH DIFFERENTIAL/PLATELET
Abs Immature Granulocytes: 0.11 10*3/uL — ABNORMAL HIGH (ref 0.00–0.07)
Basophils Absolute: 0.1 10*3/uL (ref 0.0–0.1)
Basophils Relative: 1 %
Eosinophils Absolute: 0.4 10*3/uL (ref 0.0–0.5)
Eosinophils Relative: 4 %
HCT: 39.9 % (ref 39.0–52.0)
Hemoglobin: 12.6 g/dL — ABNORMAL LOW (ref 13.0–17.0)
Immature Granulocytes: 1 %
Lymphocytes Relative: 22 %
Lymphs Abs: 1.9 10*3/uL (ref 0.7–4.0)
MCH: 25.6 pg — ABNORMAL LOW (ref 26.0–34.0)
MCHC: 31.6 g/dL (ref 30.0–36.0)
MCV: 81.1 fL (ref 80.0–100.0)
Monocytes Absolute: 1 10*3/uL (ref 0.1–1.0)
Monocytes Relative: 11 %
Neutro Abs: 5.2 10*3/uL (ref 1.7–7.7)
Neutrophils Relative %: 61 %
Platelets: 165 10*3/uL (ref 150–400)
RBC: 4.92 MIL/uL (ref 4.22–5.81)
RDW: 15.1 % (ref 11.5–15.5)
WBC: 8.6 10*3/uL (ref 4.0–10.5)
nRBC: 0 % (ref 0.0–0.2)

## 2021-04-24 LAB — COMPREHENSIVE METABOLIC PANEL
ALT: 35 U/L (ref 0–44)
AST: 38 U/L (ref 15–41)
Albumin: 3.9 g/dL (ref 3.5–5.0)
Alkaline Phosphatase: 109 U/L (ref 38–126)
Anion gap: 9 (ref 5–15)
BUN: 39 mg/dL — ABNORMAL HIGH (ref 6–20)
CO2: 23 mmol/L (ref 22–32)
Calcium: 9 mg/dL (ref 8.9–10.3)
Chloride: 104 mmol/L (ref 98–111)
Creatinine, Ser: 1.89 mg/dL — ABNORMAL HIGH (ref 0.61–1.24)
GFR, Estimated: 40 mL/min — ABNORMAL LOW (ref >60)
Glucose, Bld: 100 mg/dL — ABNORMAL HIGH (ref 70–99)
Potassium: 4.7 mmol/L (ref 3.5–5.1)
Sodium: 136 mmol/L (ref 135–145)
Total Bilirubin: 0.5 mg/dL (ref 0.3–1.2)
Total Protein: 7 g/dL (ref 6.5–8.1)

## 2021-04-24 LAB — T4, FREE: Free T4: 1.02 ng/dL (ref 0.61–1.12)

## 2021-04-24 LAB — TSH: TSH: 2.548 u[IU]/mL (ref 0.350–4.500)

## 2021-04-24 MED ORDER — HEPARIN SOD (PORK) LOCK FLUSH 100 UNIT/ML IV SOLN
500.0000 [IU] | Freq: Once | INTRAVENOUS | Status: DC | PRN
  Filled 2021-04-24: qty 5

## 2021-04-24 MED ORDER — SODIUM CHLORIDE 0.9 % IV SOLN
Freq: Once | INTRAVENOUS | Status: AC
  Filled 2021-04-24: qty 250

## 2021-04-24 MED ORDER — SODIUM CHLORIDE 0.9 % IV SOLN
200.0000 mg | Freq: Once | INTRAVENOUS | Status: AC
  Administered 2021-04-24: 200 mg via INTRAVENOUS
  Filled 2021-04-24: qty 200

## 2021-04-24 NOTE — Patient Instructions (Signed)
[facility] [place]  Discharge Instructions: ?Thank you for choosing [facility] to provide your oncology and hematology care.  ?If you have a lab appointment with the [facility], please go directly to the [facility] and check in at the registration area. ? ?Wear comfortable clothing and clothing appropriate for easy access to any Portacath or PICC line.  ? ?We strive to give you quality time with your provider. You may need to reschedule your appointment if you arrive late (15 or more minutes).  Arriving late affects you and other patients whose appointments are after yours.  Also, if you miss three or more appointments without notifying the office, you may be dismissed from the clinic at the provider?s discretion.    ?  ?For prescription refill requests, have your pharmacy contact our office and allow 72 hours for refills to be completed.   ? ?Today you received the following chemotherapy and/or immunotherapy agents KEYTRUDA    ?  ?To help prevent nausea and vomiting after your treatment, we encourage you to take your nausea medication as directed. ? ?BELOW ARE SYMPTOMS THAT SHOULD BE REPORTED IMMEDIATELY: ?*FEVER GREATER THAN 100.4 F (38 ?C) OR HIGHER ?*CHILLS OR SWEATING ?*NAUSEA AND VOMITING THAT IS NOT CONTROLLED WITH YOUR NAUSEA MEDICATION ?*UNUSUAL SHORTNESS OF BREATH ?*UNUSUAL BRUISING OR BLEEDING ?*URINARY PROBLEMS (pain or burning when urinating, or frequent urination) ?*BOWEL PROBLEMS (unusual diarrhea, constipation, pain near the anus) ?TENDERNESS IN MOUTH AND THROAT WITH OR WITHOUT PRESENCE OF ULCERS (sore throat, sores in mouth, or a toothache) ?UNUSUAL RASH, SWELLING OR PAIN  ?UNUSUAL VAGINAL DISCHARGE OR ITCHING  ? ?Items with * indicate a potential emergency and should be followed up as soon as possible or go to the Emergency Department if any problems should occur. ? ?Please show the CHEMOTHERAPY ALERT CARD or IMMUNOTHERAPY ALERT CARD at check-in to  the Emergency Department and triage nurse. ? ?Should you have questions after your visit or need to cancel or reschedule your appointment, please contact [facility] [place] [facility] [place]  [phone] and follow the prompts.  Office hours are 8:00 a.m. to 4:30 p.m. Monday - Friday. Please note that voicemails left after 4:00 p.m. may not be returned until the following business day.  We are closed weekends and major holidays. You have access to a nurse at all times for urgent questions. Please call the main number to the clinic [phone] and follow the prompts. ? ?For any non-urgent questions, you may also contact your provider using MyChart. We now offer e-Visits for anyone 2 and older to request care online for non-urgent symptoms. For details visit [email or web address]. ?  ?Also download the MyChart app! Go to the app store, search "MyChart", open the app, select [facility], and log in with your MyChart username and password. ? ?Due to Covid, a mask is required upon entering the hospital/clinic. If you do not have a mask, one will be given to you upon arrival. For doctor visits, patients may have 1 support person aged 58 or older with them. For treatment visits, patients cannot have anyone with them due to current Covid guidelines and our immunocompromised population.  ? ?Pembrolizumab injection ??Qu? es [employer]? ?El PEMBROLIZUMAB es un anticuerpo monoclonal. Se [place] para tratar ciertos tipos de c?ncer. ?[employer] puede ser utilizado para otros usos; si tiene alguna pregunta consulte con su proveedor de atenci?n m?dica o con su farmac?utico. ?MARCAS COMUNES: Keytruda ??Qu? le debo informar a mi profesional de la  salud antes de tomar este medicamento? ?Necesitan saber si usted presenta alguno de los [employer] o situaciones: ?enfermedades autoinmunes tales como enfermedad de Crohn, colitis ulcerativa o lupus ?ha tenido o planea tener un trasplante alog?nico de c?lulas  madre ([place] las c?lulas madre de otra persona) ?antecedentes de trasplante de ?rganos ?antecedentes de radiaci?n en el pecho ?problemas del sistema nervioso, tales como miastenia grave o s?ndrome de Guillain-Barr? ?una reacci?n al?rgica o inusual al pembrolizumab, a otros medicamentos, alimentos, colorantes o conservantes ?si est? embarazada o buscando quedar embarazada ?si est? amamantando a un beb? ??C?mo debo utilizar este medicamento? ?Este medicamento se administra mediante infusi?n en una vena. Lo administra un profesional de Technical sales engineer en un hospital o en un entorno cl?nico. ?Se le entregar? una Gu?a del medicamento (MedGuide, su nombre en ingl?s) especial antes de cada tratamiento. Aseg?rese de leer esta informaci?n cada vez cuidadosamente. ?Hable con su pediatra para informarse acerca del uso de este medicamento en ni?os. Aunque este medicamento se puede recetar a ni?os tan peque?os como de 6 meses de edad con ciertas afecciones, existen precauciones que deben tomarse. ?Sobredosis: P?ngase en contacto inmediatamente con un centro toxicol?gico o una sala de urgencia si usted cree que haya tomado demasiado medicamento. ?ATENCI?N: [employer] es solo para usted. No comparta este medicamento con nadie. ??Qu? sucede si me olvido de una dosis? ?Es importante no olvidar ninguna dosis. Informe a su m?dico o a su profesional de la salud si no puede asistir a una cita. ??Qu? puede interactuar con este medicamento? ?No se han estudiado las interacciones. ?Puede ser que esta lista no menciona todas las posibles interacciones. Informe a su profesional de la salud de [employer] productos a base de hierbas, medicamentos de venta libre o suplementos nutritivos que est? tomando. Si usted fuma, consume bebidas alcoh?licas o si utiliza drogas ilegales, ind?queselo tambi?n a su profesional de [employer]	[place]. Algunas sustancias pueden interactuar con su medicamento. ??A qu? debo estar atento al [employer]? ?Se supervisar?  su estado de salud atentamente mientras reciba este medicamento. ?Usted podr?a necesitar realizarse an?lisis de Environmental health practitioner est? [employer]. ?No debe quedar embarazada mientras est? usando este medicamento o por 4 meses despu?s de dejar de usarlo. Las mujeres deben informar a su m?dico si est?n buscando quedar embarazadas o si creen que podr?an estar embarazadas. Existe la posibilidad de efectos secundarios graves en un beb? sin nacer. Para obtener m?s informaci?n, hable con su profesional de la salud o su farmac?utico. No debe amamantar a un beb? mientras est? usando este medicamento o durante 4 meses despu?s de la ?ltima dosis. ??Qu? efectos secundarios puedo tener al [employer] medicamento? ?Efectos secundarios que debe informar a su m?dico o a su profesional de la salud tan pronto como sea posible: ?reacciones al?rgicas, tales como erupci?n cut?nea, comez?n/picaz?n o urticaria, e hinchaz?n de la cara, los labios o la lengua ?con [place] o de color negro y aspecto alquitranado ?problemas para respirar ?cambios en la visi?n ?dolor en el pecho ?escalofr?os ?confusi?n ?estre?imiento ?tos ?diarrea ?mareo, sensaci?n de desmayo o aturdimiento ?frecuencia cardiaca r?pida o irregular ?fiebre ?enrojecimiento ?dolor en las articulaciones ?recuentos sangu?neos bajos: este medicamento podr?a reducir la cantidad de gl?bulos blancos, gl?bulos rojos y plaquetas. Su riesgo de infecci?n y sangrado podr?a ser mayor. ?dolor muscular ?debilidad muscular ?dolor, hormigueo o entumecimiento de las manos o los pies ?dolor de cabeza persistente ?enrojecimiento, formaci?n de ampollas, descamaci?n o distensi?n de la piel, incluso dentro de la boca ?signos y s?ntomas de niveles  elevados de az?car en la sangre, tales como mareo; boca seca; piel seca; aliento frutal; n?useas; dolor de est?mago; aumento del apetito o la sed; aumento de la frecuencia urinaria ?signos y s?ntomas de lesi?n renal, tales como dificultad para  orinar o cambios en la cantidad de orina ?signos y s?ntomas de lesi?n en el h?gado, como orina oscura, heces claras, p?rdida del apetito, n?useas, dolor en la regi?n abdominal superior derecha, color amaril

## 2021-04-24 NOTE — Progress Notes (Signed)
? ?Hematology/Oncology Progress note ?Telephone:([phone] [phone] ?  ? ?Patient Care Team: ?Center, [facility] as PCP - General (General Practice)  ? ?Name of the patient: Mark Donaldson  ?[id number]  ?1960-04-14  ?Date of visit: 04/24/21 ? ? ?PERTINENT HISTORY-  ?61 y.o. male presents for follow-up of renal cell carcinoma treatments. ?Patient has past medical history CAD status post PCI to LAD in 2018, on aspirin and Plavix, CHF, history of focal sclerosing/crescentic GN-ANCA positive and CKD stage III. ?He was hospitalized from 05/02/2019-05/07/2019 due to gross hematuria. ?CT showed a 7.4 cm left lower pole renal mass concerning for RCC.  Patient also has necrotic lymphadenopathy in the mediastinum and the right supra hilar region which were highly suspicious for metastatic disease.  Bilateral lung nodules, also concerning for metastatic lung disease.  Right retrocrural lymphadenopathy with upper normal left periaortic lymph node. ?Patient was recommended for cytoreductive radical nephrectomy.  With history of ANCA positive vasculitis, also recommend thoracic lymphadenopathy biopsy to confirm distal metastasis.  Patient agreed to radical nephrectomy and declined bronchoscopy biopsy. ?He underwent left radical nephrectomy on 05/05/2019. ?Pathology showed pT3a pNx, RCC, conventional clear cell type, grade 3 ?Patient was discharged home. ?Today he presents to discuss pathology and the management plan. ?He was accompanied by his wife and daughter. ?Patient reports that he uses hydrocodone for pain.  Pain around the surgical site has improved.  He occasionally has tingling sensation around the surgical site.  Denies any fever or chills, drainage from the surgical site. ? ?# Interval CT chest abdomen pelvis was independently reviewed by me and discussed with patient. ?01/12/2020, CT showed overall marked improvement.  Comparing to CT scan in August 2021, continues to have treatment  response.Mediastinal soft tissue nodule nearly completely resolved, right minor fissure nodule 8 mm, decreased in size.  Resolution of pleural fluid and thickening of the right chest.  Small pleural effusion in the left chest.  No new lung nodules.-Subtle variation of the muscle architecture of the left psoas muscle of unknown significance.-Continue observation.  He is asymptomatic. ?Subtle nodule in the soft tissue of the left flank. ? 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 He finished 14 days of albendazole plus praziquantel .   ?He has had few side effects including poor appetite, hair loss, weight loss, bloody stool, abdominal cramping or diarrhea. All symptoms have improved since he completed the treatment. 03/16/2020 he finished tapering course of steroids ? ?# It has been noted that his glucose level has been elevated in December 2021, even before he was started on prednisone treatments. Dr. Steva Ready has obtained A1c which came back elevated at 8.7. ? ?# 04/30/2020, CT chest abdomen pelvis without contrast showed stable disease.   ?#04/30/2020, axitinib and immunotherapy were held due to diarrhea and fatigue. ?#05/15/2020 resumed on immunotherapy. ?07/31/2020, CT chest abdomen pelvis showed stable disease ?09/11/2020 resumed axitinib 5 mg twice daily, stopped on 10/23/2020 due to diarrhea. ?Continued on Keytruda treatments. ? ?#Intermittent orthopnea, symptom has resolved.  Continue Lasix ?Pre-existing diastolic CHF, 6/78/9381 LVEF 60 to 65%.   ?Recommend him to follow-up with cardiology-he is has not able to follow-up with cardiology due to lack of insurance coverage. ? 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  Patient has been referred to orthopedic surgeon in [place] which he is not able to go due to transportation.  He does not have insurance as well.  He is currently asymptomatic.  Monitor. ? ?11/28/2020, CT abdomen pelvis without contrast showed stable examination.  No  evidence of new or progressive disease in the chest abdomen pelvis.  Stable to minimally decreased size of 5 mm pulmonary nodule in the minor fissure in the right chest.  Sigmoid colon diverticulosis without findings of acute diverticulitis. ? ?Continued on immunotherapy with Keytruda every 3 weeks ?01/17/2021 Keytruda was held due to transaminitis, immunotherapy induced liver toxicities. ?02/20/2021, resumed Keytruda. Axitinib continues to be held.  ?03/08/2021, CT scan showed NED ? ?INTERVAL HISTORY ?Mark Donaldson is a 61 y.o. male who has above history reviewed by me today presents for follow up visit for management of metastatic RCC ? ?Patient is Spanish-speaking.   Spanish interpreter presented the entire encounter for translation ?Reports feeling well.  No nausea vomiting diarrhea, he denies any new complaints. ? ? ? ?Review of systems- Review of Systems  ?Constitutional:  Negative for appetite change, chills, fatigue, fever and unexpected weight change.  ?HENT:   Negative for voice change.   ?Eyes:  Negative for eye problems and icterus.  ?Respiratory:  Negative for chest tightness and cough.   ?Cardiovascular:  Negative for chest pain and leg swelling.  ?Gastrointestinal:  Negative for abdominal distention, abdominal pain and diarrhea.  ?Endocrine: Negative for hot flashes.  ?Genitourinary:  Negative for difficulty urinating, dysuria and frequency.   ?Musculoskeletal:  Positive for arthralgias.  ?Skin:  Negative for itching and rash.  ?Neurological:  Negative for light-headedness and numbness.  ?Hematological:  Negative for adenopathy. Does not bruise/bleed easily.  ?Psychiatric/Behavioral:  Negative for confusion.   ? ?No Known Allergies ? ?Patient Active Problem List  ? Diagnosis Date Noted  ? Transaminitis 01/17/2021  ? History of CHF (congestive heart failure) 05/01/2020  ? Hyperglycemia 02/28/2020  ? Stage 3b chronic kidney disease ([place]) 10/04/2019  ? Brain lesion 10/04/2019  ? Encounter for  antineoplastic immunotherapy 07/19/2019  ? Encounter for antineoplastic chemotherapy 07/19/2019  ? CNS lesion 07/07/2019  ? Anemia in stage 3b chronic kidney disease ([place]) 06/07/2019  ? Goals of care, counseling/discussion 05/23/2019  ? Renal cell carcinoma of left kidney ([place]) 05/23/2019  ? Renal cell carcinoma ([place]) 05/23/2019  ? Palliative care encounter   ? History of vasculitis   ? Thoracic lymphadenopathy   ? Lung nodule   ? CAD S/P percutaneous coronary angioplasty 05/02/2019  ? Kidney mass 05/02/2019  ? Hydronephrosis of left kidney 05/02/2019  ? UTI (urinary tract infection) 05/02/2019  ? AKI (acute kidney injury) ([place]) 05/02/2019  ? Gross hematuria 05/02/2019  ? Chronic diastolic CHF (congestive heart failure) ([place]) 05/02/2019  ? Sepsis ([place]) 05/02/2019  ? Severe sepsis ([place]) 05/02/2019  ? Preop cardiovascular exam   ? Hyperlipidemia 05/21/2016  ? 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 Prediabetes 05/21/2016  ? Alcohol abuse 05/18/2016  ? Essential hypertension 05/18/2016  ? Vasculitis, 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[place]) 05/18/2016  ? Shortness of breath 05/17/2016  ? ? ? ?Past Medical History:  ?Diagnosis Date  ? ANCA-positive vasculitis ([place])   ? CAD (coronary artery disease)   ? Essential hypertension   ? HFrEF (heart failure with reduced ejection fraction) ([place])   ? Hyperlipidemia LDL goal <70   ? Ischemic cardiomyopathy   ? Renal cell carcinoma ([place]) 05/23/2019  ? Renal disorder   ? ? ? ?Past Surgical History:  ?Procedure Laterality Date  ? CARDIAC SURGERY    ? cardiac cath with 2 stent placement  ? LAPAROSCOPIC NEPHRECTOMY, HAND ASSISTED Left 05/05/2019  ? Procedure: HAND ASSISTED LAPAROSCOPIC NEPHRECTOMY;  Surgeon: Hollice Espy, MD;  Location: [facility] ORS;  Service: Urology;  Laterality: Left;  ? ? ?Social History  ? ?Socioeconomic History  ? Marital status: Married  ?  Spouse name: Not on file  ? Number of children: Not on file  ? Years of education: Not on file  ? Highest education  level: Not on file  ?Occupational History  ? Not on file  ?Tobacco Use  ? Smoking status: Never  ? Smokeless tobacco: Never  ?Substance and Sexual Activity  ? Alcohol use: Yes  ? Drug use: Not on file  ? Sexual a

## 2021-05-14 ENCOUNTER — Telehealth: Payer: Self-pay

## 2021-05-14 NOTE — Telephone Encounter (Signed)
Pt informed of new appt details. He will be in to sign forms either today or tomorrow.  ?

## 2021-05-14 NOTE — Telephone Encounter (Signed)
Please reschedule

## 2021-05-14 NOTE — Telephone Encounter (Signed)
Per secure chat from Henreitta Leber, patients drug assistance has expired. In order for patient to continue with treatment for [facility], we will wait for the drug assistance approval. Forms have been filled out, just waiting for provider and patient signature. Patient to come in tomorrow to sign. Per Dr. Tasia Catchings, cancel appt this week and reschedule until next week.  ? ? ?Lab/MD/Keytruda next week...ahs ?

## 2021-05-15 ENCOUNTER — Inpatient Hospital Stay: Payer: Self-pay

## 2021-05-15 ENCOUNTER — Telehealth: Payer: Self-pay

## 2021-05-15 ENCOUNTER — Inpatient Hospital Stay: Payer: Self-pay | Admitting: Oncology

## 2021-05-15 NOTE — Telephone Encounter (Signed)
Patient assistance forms for Keytruda have been signed by pt and MD and faxed back to [employer].  ?

## 2021-05-22 ENCOUNTER — Other Ambulatory Visit: Payer: Self-pay

## 2021-05-22 ENCOUNTER — Inpatient Hospital Stay: Payer: Self-pay

## 2021-05-22 ENCOUNTER — Inpatient Hospital Stay: Payer: Self-pay | Attending: Oncology | Admitting: Oncology

## 2021-05-22 ENCOUNTER — Encounter: Payer: Self-pay | Admitting: Oncology

## 2021-05-22 VITALS — BP 133/91 | Temp 96.2°F | Resp 18 | Wt 163.1 lb

## 2021-05-22 VITALS — BP 106/69 | HR 61 | Resp 16

## 2021-05-22 DIAGNOSIS — Z905 Acquired absence of kidney: Secondary | ICD-10-CM | POA: Insufficient documentation

## 2021-05-22 DIAGNOSIS — G893 Neoplasm related pain (acute) (chronic): Secondary | ICD-10-CM | POA: Insufficient documentation

## 2021-05-22 DIAGNOSIS — Z5112 Encounter for antineoplastic immunotherapy: Secondary | ICD-10-CM | POA: Insufficient documentation

## 2021-05-22 DIAGNOSIS — B690 Cysticercosis of central nervous system: Secondary | ICD-10-CM

## 2021-05-22 DIAGNOSIS — C642 Malignant neoplasm of left kidney, except renal pelvis: Secondary | ICD-10-CM | POA: Insufficient documentation

## 2021-05-22 DIAGNOSIS — Z955 Presence of coronary angioplasty implant and graft: Secondary | ICD-10-CM | POA: Insufficient documentation

## 2021-05-22 DIAGNOSIS — I509 Heart failure, unspecified: Secondary | ICD-10-CM | POA: Insufficient documentation

## 2021-05-22 DIAGNOSIS — Z8679 Personal history of other diseases of the circulatory system: Secondary | ICD-10-CM

## 2021-05-22 DIAGNOSIS — N1832 Chronic kidney disease, stage 3b: Secondary | ICD-10-CM | POA: Insufficient documentation

## 2021-05-22 DIAGNOSIS — I130 Hypertensive heart and chronic kidney disease with heart failure and stage 1 through stage 4 chronic kidney disease, or unspecified chronic kidney disease: Secondary | ICD-10-CM | POA: Insufficient documentation

## 2021-05-22 DIAGNOSIS — I2510 Atherosclerotic heart disease of native coronary artery without angina pectoris: Secondary | ICD-10-CM | POA: Insufficient documentation

## 2021-05-22 DIAGNOSIS — M879 Osteonecrosis, unspecified: Secondary | ICD-10-CM | POA: Insufficient documentation

## 2021-05-22 LAB — COMPREHENSIVE METABOLIC PANEL
ALT: 30 U/L (ref 0–44)
AST: 30 U/L (ref 15–41)
Albumin: 4 g/dL (ref 3.5–5.0)
Alkaline Phosphatase: 109 U/L (ref 38–126)
Anion gap: 7 (ref 5–15)
BUN: 48 mg/dL — ABNORMAL HIGH (ref 6–20)
CO2: 22 mmol/L (ref 22–32)
Calcium: 8.9 mg/dL (ref 8.9–10.3)
Chloride: 105 mmol/L (ref 98–111)
Creatinine, Ser: 2.32 mg/dL — ABNORMAL HIGH (ref 0.61–1.24)
GFR, Estimated: 32 mL/min — ABNORMAL LOW (ref >60)
Glucose, Bld: 110 mg/dL — ABNORMAL HIGH (ref 70–99)
Potassium: 4.7 mmol/L (ref 3.5–5.1)
Sodium: 134 mmol/L — ABNORMAL LOW (ref 135–145)
Total Bilirubin: 0.6 mg/dL (ref 0.3–1.2)
Total Protein: 6.8 g/dL (ref 6.5–8.1)

## 2021-05-22 LAB — CBC WITH DIFFERENTIAL/PLATELET
Abs Immature Granulocytes: 0.02 10*3/uL (ref 0.00–0.07)
Basophils Absolute: 0.1 10*3/uL (ref 0.0–0.1)
Basophils Relative: 1 %
Eosinophils Absolute: 0.4 10*3/uL (ref 0.0–0.5)
Eosinophils Relative: 5 %
HCT: 38.6 % — ABNORMAL LOW (ref 39.0–52.0)
Hemoglobin: 12 g/dL — ABNORMAL LOW (ref 13.0–17.0)
Immature Granulocytes: 0 %
Lymphocytes Relative: 29 %
Lymphs Abs: 2.4 10*3/uL (ref 0.7–4.0)
MCH: 25.3 pg — ABNORMAL LOW (ref 26.0–34.0)
MCHC: 31.1 g/dL (ref 30.0–36.0)
MCV: 81.4 fL (ref 80.0–100.0)
Monocytes Absolute: 1 10*3/uL (ref 0.1–1.0)
Monocytes Relative: 13 %
Neutro Abs: 4.2 10*3/uL (ref 1.7–7.7)
Neutrophils Relative %: 52 %
Platelets: 154 10*3/uL (ref 150–400)
RBC: 4.74 MIL/uL (ref 4.22–5.81)
RDW: 15.1 % (ref 11.5–15.5)
WBC: 8.1 10*3/uL (ref 4.0–10.5)
nRBC: 0 % (ref 0.0–0.2)

## 2021-05-22 MED ORDER — SODIUM CHLORIDE 0.9 % IV SOLN
200.0000 mg | Freq: Once | INTRAVENOUS | Status: AC
  Administered 2021-05-22: 200 mg via INTRAVENOUS
  Filled 2021-05-22: qty 200

## 2021-05-22 MED ORDER — SODIUM CHLORIDE 0.9 % IV SOLN
Freq: Once | INTRAVENOUS | Status: AC
  Filled 2021-05-22: qty 250

## 2021-05-22 NOTE — Progress Notes (Signed)
..  Patient is receiving Assistance Medication - Supplied Externally. ?Medication: Keytruda ?Manufacture: [employer] ?Approval Dates: Approved from 05/22/2021 until 05/23/2022. ?ID: [id number] ?Reason: Self Pay ?First DOS: 05/22/2021 ?  ?

## 2021-05-22 NOTE — Patient Instructions (Signed)
[facility]  Discharge Instructions: °Thank you for choosing [facility] to provide your oncology and hematology care.  °If you have a lab appointment with the Cancer Center, please go directly to the Cancer Center and check in at the registration area. ° °Wear comfortable clothing and clothing appropriate for easy access to any Portacath or PICC line.  ° °We strive to give you quality time with your provider. You may need to reschedule your appointment if you arrive late (15 or more minutes).  Arriving late affects you and other patients whose appointments are after yours.  Also, if you miss three or more appointments without notifying the office, you may be dismissed from the clinic at the provider’s discretion.    °  °For prescription refill requests, have your pharmacy contact our office and allow 72 hours for refills to be completed.   ° °Today you received the following chemotherapy and/or immunotherapy agents Keytruda °    °  °To help prevent nausea and vomiting after your treatment, we encourage you to take your nausea medication as directed. ° °BELOW ARE SYMPTOMS THAT SHOULD BE REPORTED IMMEDIATELY: °*FEVER GREATER THAN 100.4 F (38 °C) OR HIGHER °*CHILLS OR SWEATING °*NAUSEA AND VOMITING THAT IS NOT CONTROLLED WITH YOUR NAUSEA MEDICATION °*UNUSUAL SHORTNESS OF BREATH °*UNUSUAL BRUISING OR BLEEDING °*URINARY PROBLEMS (pain or burning when urinating, or frequent urination) °*BOWEL PROBLEMS (unusual diarrhea, constipation, pain near the anus) °TENDERNESS IN MOUTH AND THROAT WITH OR WITHOUT PRESENCE OF ULCERS (sore throat, sores in mouth, or a toothache) °UNUSUAL RASH, SWELLING OR PAIN  °UNUSUAL VAGINAL DISCHARGE OR ITCHING  ° °Items with * indicate a potential emergency and should be followed up as soon as possible or go to the Emergency Department if any problems should occur. ° °Please show the CHEMOTHERAPY ALERT CARD or IMMUNOTHERAPY ALERT CARD at check-in to  the Emergency Department and triage nurse. ° °Should you have questions after your visit or need to cancel or reschedule your appointment, please contact [facility]  [phone] and follow the prompts.  Office hours are 8:00 a.m. to 4:30 p.m. Monday - Friday. Please note that voicemails left after 4:00 p.m. may not be returned until the following business day.  We are closed weekends and major holidays. You have access to a nurse at all times for urgent questions. Please call the main number to the clinic [phone] and follow the prompts. ° °For any non-urgent questions, you may also contact your provider using MyChart. We now offer e-Visits for anyone 18 and older to request care online for non-urgent symptoms. For details visit [email or web address]. °  °Also download the MyChart app! Go to the app store, search "MyChart", open the app, select [facility], and log in with your MyChart username and password. ° °Due to Covid, a mask is required upon entering the hospital/clinic. If you do not have a mask, one will be given to you upon arrival. For doctor visits, patients may have 1 support person aged 18 or older with them. For treatment visits, patients cannot have anyone with them due to current Covid guidelines and our immunocompromised population.  °

## 2021-05-22 NOTE — Progress Notes (Signed)
? ?Hematology/Oncology Progress note ?Telephone:(336) [phone] [phone] ?  ? ?Patient Care Team: ?Center, [facility] as PCP - General (General Practice)  ? ?Name of the patient: Mark Donaldson  ?[id number]  ?[birth year]  ?Date of visit: 05/22/21 ? ? ?PERTINENT HISTORY-  ?60 y.o. male presents for follow-up of renal cell carcinoma treatments. ?Patient has past medical history CAD status post PCI to LAD in 2018, on aspirin and Plavix, CHF, history of focal sclerosing/crescentic GN-ANCA positive and CKD stage III. ?He was hospitalized from 05/02/2019-05/07/2019 due to gross hematuria. ?CT showed a 7.4 cm left lower pole renal mass concerning for RCC.  Patient also has necrotic lymphadenopathy in the mediastinum and the right supra hilar region which were highly suspicious for metastatic disease.  Bilateral lung nodules, also concerning for metastatic lung disease.  Right retrocrural lymphadenopathy with upper normal left periaortic lymph node. ?Patient was recommended for cytoreductive radical nephrectomy.  With history of ANCA positive vasculitis, also recommend thoracic lymphadenopathy biopsy to confirm distal metastasis.  Patient agreed to radical nephrectomy and declined bronchoscopy biopsy. ?He underwent left radical nephrectomy on 05/05/2019. ?Pathology showed pT3a pNx, RCC, conventional clear cell type, grade 3 ?Patient was discharged home. ?Today he presents to discuss pathology and the management plan. ?He was accompanied by his wife and daughter. ?Patient reports that he uses hydrocodone for pain.  Pain around the surgical site has improved.  He occasionally has tingling sensation around the surgical site.  Denies any fever or chills, drainage from the surgical site. ? ?# Interval CT chest abdomen pelvis was independently reviewed by me and discussed with patient. ?01/12/2020, CT showed overall marked improvement.  Comparing to CT scan in August 2021, continues to have treatment  response.Mediastinal soft tissue nodule nearly completely resolved, right minor fissure nodule 8 mm, decreased in size.  Resolution of pleural fluid and thickening of the right chest.  Small pleural effusion in the left chest.  No new lung nodules.-Subtle variation of the muscle architecture of the left psoas muscle of unknown significance.-Continue observation.  He is asymptomatic. ?Subtle nodule in the soft tissue of the left flank. ? 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 He finished 14 days of albendazole plus praziquantel .   ?He has had few side effects including poor appetite, hair loss, weight loss, bloody stool, abdominal cramping or diarrhea. All symptoms have improved since he completed the treatment. 03/16/2020 he finished tapering course of steroids ? ?# It has been noted that his glucose level has been elevated in December 2021, even before he was started on prednisone treatments. Dr. Steva Ready has obtained A1c which came back elevated at 8.7. ? ?# 04/30/2020, CT chest abdomen pelvis without contrast showed stable disease.   ?#04/30/2020, axitinib and immunotherapy were held due to diarrhea and fatigue. ?#05/15/2020 resumed on immunotherapy. ?07/31/2020, CT chest abdomen pelvis showed stable disease ?09/11/2020 resumed axitinib 5 mg twice daily, stopped on 10/23/2020 due to diarrhea. ?Continued on Keytruda treatments. ? ?#Intermittent orthopnea, symptom has resolved.  Continue Lasix ?Pre-existing diastolic CHF, 4/43/1540 LVEF 60 to 65%.   ?Recommend him to follow-up with cardiology-he is has not able to follow-up with cardiology due to lack of insurance coverage. ? 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  Patient has been referred to orthopedic surgeon in [place] which he is not able to go due to transportation.  He does not have insurance as well.  He is currently asymptomatic.  Monitor. ? ?11/28/2020, CT abdomen pelvis without contrast showed stable examination.  No  evidence of new or progressive disease in the chest abdomen pelvis.  Stable to minimally decreased size of 5 mm pulmonary nodule in the minor fissure in the right chest.  Sigmoid colon diverticulosis without findings of acute diverticulitis. ? ?Continued on immunotherapy with Keytruda every 3 weeks ?01/17/2021 Keytruda was held due to transaminitis, immunotherapy induced liver toxicities. ?02/20/2021, resumed Keytruda. Axitinib continues to be held.  ?03/08/2021, CT scan showed NED ? ?INTERVAL HISTORY ?Mark Donaldson is a 61 y.o. male who has above history reviewed by me today presents for follow up visit for management of metastatic RCC ? ?Patient is Spanish-speaking.   Spanish interpreter presented the entire encounter for translation ?Patient reports feeling well.  Denies any nausea vomiting diarrhea, shortness of breath.  He started to feel bilateral hip pressure.  Previously not able to make to appointment to [facility]opedic surgeon.  He does not have insurance, not able to establish care with local orthopedic surgeon clinic. ? ? ?Review of systems- Review of Systems  ?Constitutional:  Negative for appetite change, chills, fatigue, fever and unexpected weight change.  ?HENT:   Negative for voice change.   ?Eyes:  Negative for eye problems and icterus.  ?Respiratory:  Negative for chest tightness and cough.   ?Cardiovascular:  Negative for chest pain and leg swelling.  ?Gastrointestinal:  Negative for abdominal distention, abdominal pain and diarrhea.  ?Endocrine: Negative for hot flashes.  ?Genitourinary:  Negative for difficulty urinating, dysuria and frequency.   ?Musculoskeletal:  Positive for arthralgias.  ?Skin:  Negative for itching and rash.  ?Neurological:  Negative for light-headedness and numbness.  ?Hematological:  Negative for adenopathy. Does not bruise/bleed easily.  ?Psychiatric/Behavioral:  Negative for confusion.   ? ?No Known Allergies ? ?Patient Active Problem List  ? Diagnosis  Date Noted  ? Transaminitis 01/17/2021  ? History of CHF (congestive heart failure) 05/01/2020  ? Hyperglycemia 02/28/2020  ? Stage 3b chronic kidney disease ([place]) 10/04/2019  ? Brain lesion 10/04/2019  ? Encounter for antineoplastic immunotherapy 07/19/2019  ? Encounter for antineoplastic chemotherapy 07/19/2019  ? CNS lesion 07/07/2019  ? Anemia in stage 3b chronic kidney disease ([place]) 06/07/2019  ? Goals of care, counseling/discussion 05/23/2019  ? Renal cell carcinoma of left kidney ([place]) 05/23/2019  ? Renal cell carcinoma ([place]) 05/23/2019  ? Palliative care encounter   ? History of vasculitis   ? Thoracic lymphadenopathy   ? Lung nodule   ? CAD S/P percutaneous coronary angioplasty 05/02/2019  ? Kidney mass 05/02/2019  ? Hydronephrosis of left kidney 05/02/2019  ? UTI (urinary tract infection) 05/02/2019  ? AKI (acute kidney injury) ([place]) 05/02/2019  ? Gross hematuria 05/02/2019  ? Chronic diastolic CHF (congestive heart failure) ([place]) 05/02/2019  ? Sepsis ([place]) 05/02/2019  ? Severe sepsis ([place]) 05/02/2019  ? Preop cardiovascular exam   ? Hyperlipidemia 05/21/2016  ? 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 Prediabetes 05/21/2016  ? Alcohol abuse 05/18/2016  ? Essential hypertension 05/18/2016  ? Vasculitis, 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[place]) 05/18/2016  ? Shortness of breath 05/17/2016  ? ? ? ?Past Medical History:  ?Diagnosis Date  ? ANCA-positive vasculitis ([place])   ? CAD (coronary artery disease)   ? Essential hypertension   ? HFrEF (heart failure with reduced ejection fraction) ([place])   ? Hyperlipidemia LDL goal <70   ? Ischemic cardiomyopathy   ? Renal cell carcinoma ([place]) 05/23/2019  ? Renal disorder   ? ? ? ?Past Surgical History:  ?Procedure Laterality Date  ?  CARDIAC SURGERY    ? cardiac cath with 2 stent placement  ? LAPAROSCOPIC NEPHRECTOMY, HAND ASSISTED Left 05/05/2019  ? Procedure: HAND ASSISTED LAPAROSCOPIC NEPHRECTOMY;  Surgeon: Hollice Espy, MD;  Location: [facility] ORS;  Service:  Urology;  Laterality: Left;  ? ? ?Social History  ? ?Socioeconomic History  ? Marital status: Married  ?  Spouse name: Not on file  ? Number of children: Not on file  ? Years of education: Not on file  ? Highest e

## 2021-05-22 NOTE — Progress Notes (Signed)
Pt here for follow up. No new concerns voiced.   

## 2021-05-28 ENCOUNTER — Inpatient Hospital Stay: Admission: RE | Admit: 2021-05-28 | Payer: Self-pay | Source: Ambulatory Visit

## 2021-05-28 ENCOUNTER — Ambulatory Visit
Admission: RE | Admit: 2021-05-28 | Discharge: 2021-05-28 | Disposition: A | Discharge status: Home / Self Care | Payer: Self-pay | Source: Ambulatory Visit | Attending: Oncology | Admitting: Oncology

## 2021-05-28 DIAGNOSIS — C642 Malignant neoplasm of left kidney, except renal pelvis: Secondary | ICD-10-CM | POA: Insufficient documentation

## 2021-06-12 ENCOUNTER — Encounter: Payer: Self-pay | Admitting: Oncology

## 2021-06-12 ENCOUNTER — Inpatient Hospital Stay: Payer: Self-pay

## 2021-06-12 ENCOUNTER — Inpatient Hospital Stay (HOSPITAL_BASED_OUTPATIENT_CLINIC_OR_DEPARTMENT_OTHER): Payer: Self-pay | Admitting: Oncology

## 2021-06-12 VITALS — BP 139/97 | HR 67 | Temp 97.1°F | Resp 18 | Wt 164.5 lb

## 2021-06-12 DIAGNOSIS — C642 Malignant neoplasm of left kidney, except renal pelvis: Secondary | ICD-10-CM

## 2021-06-12 DIAGNOSIS — Z5112 Encounter for antineoplastic immunotherapy: Secondary | ICD-10-CM

## 2021-06-12 DIAGNOSIS — N1832 Chronic kidney disease, stage 3b: Secondary | ICD-10-CM

## 2021-06-12 DIAGNOSIS — M8700 Idiopathic aseptic necrosis of unspecified bone: Secondary | ICD-10-CM

## 2021-06-12 LAB — COMPREHENSIVE METABOLIC PANEL
ALT: 38 U/L (ref 0–44)
AST: 40 U/L (ref 15–41)
Albumin: 3.9 g/dL (ref 3.5–5.0)
Alkaline Phosphatase: 112 U/L (ref 38–126)
Anion gap: 7 (ref 5–15)
BUN: 39 mg/dL — ABNORMAL HIGH (ref 6–20)
CO2: 26 mmol/L (ref 22–32)
Calcium: 8.7 mg/dL — ABNORMAL LOW (ref 8.9–10.3)
Chloride: 105 mmol/L (ref 98–111)
Creatinine, Ser: 2.17 mg/dL — ABNORMAL HIGH (ref 0.61–1.24)
GFR, Estimated: 34 mL/min — ABNORMAL LOW (ref >60)
Glucose, Bld: 112 mg/dL — ABNORMAL HIGH (ref 70–99)
Potassium: 4.5 mmol/L (ref 3.5–5.1)
Sodium: 138 mmol/L (ref 135–145)
Total Bilirubin: 0.5 mg/dL (ref 0.3–1.2)
Total Protein: 7 g/dL (ref 6.5–8.1)

## 2021-06-12 LAB — CBC WITH DIFFERENTIAL/PLATELET
Abs Immature Granulocytes: 0.03 10*3/uL (ref 0.00–0.07)
Basophils Absolute: 0 10*3/uL (ref 0.0–0.1)
Basophils Relative: 1 %
Eosinophils Absolute: 0.4 10*3/uL (ref 0.0–0.5)
Eosinophils Relative: 4 %
HCT: 39.2 % (ref 39.0–52.0)
Hemoglobin: 12.1 g/dL — ABNORMAL LOW (ref 13.0–17.0)
Immature Granulocytes: 0 %
Lymphocytes Relative: 28 %
Lymphs Abs: 2.3 10*3/uL (ref 0.7–4.0)
MCH: 25.2 pg — ABNORMAL LOW (ref 26.0–34.0)
MCHC: 30.9 g/dL (ref 30.0–36.0)
MCV: 81.7 fL (ref 80.0–100.0)
Monocytes Absolute: 1.1 10*3/uL — ABNORMAL HIGH (ref 0.1–1.0)
Monocytes Relative: 13 %
Neutro Abs: 4.5 10*3/uL (ref 1.7–7.7)
Neutrophils Relative %: 54 %
Platelets: 174 10*3/uL (ref 150–400)
RBC: 4.8 MIL/uL (ref 4.22–5.81)
RDW: 15 % (ref 11.5–15.5)
WBC: 8.3 10*3/uL (ref 4.0–10.5)
nRBC: 0 % (ref 0.0–0.2)

## 2021-06-12 LAB — TSH: TSH: 2.378 u[IU]/mL (ref 0.350–4.500)

## 2021-06-12 LAB — T4, FREE: Free T4: 1.02 ng/dL (ref 0.61–1.12)

## 2021-06-12 MED ORDER — HEPARIN SOD (PORK) LOCK FLUSH 100 UNIT/ML IV SOLN
500.0000 [IU] | Freq: Once | INTRAVENOUS | Status: DC | PRN
  Filled 2021-06-12: qty 5

## 2021-06-12 MED ORDER — SODIUM CHLORIDE 0.9 % IV SOLN
Freq: Once | INTRAVENOUS | Status: AC
  Filled 2021-06-12: qty 250

## 2021-06-12 MED ORDER — SODIUM CHLORIDE 0.9 % IV SOLN
200.0000 mg | Freq: Once | INTRAVENOUS | Status: AC
  Administered 2021-06-12: 200 mg via INTRAVENOUS
  Filled 2021-06-12: qty 200

## 2021-06-12 NOTE — Progress Notes (Signed)
Hematology/Oncology Progress note [phone] Fax:([phone]) [phone]    Patient Care Team: [facility], [facility] as PCP - General (General Practice) Earlie Server, MD as Consulting Physician (Oncology)   Name of the patient: Mark Donaldson  [id number]  01/30/1960  Date of visit: 06/12/21   PERTINENT HISTORY-  61 y.o. male presents for follow-up of renal cell carcinoma treatments. Patient has past medical history CAD status post PCI to LAD in 2018, on aspirin and Plavix, CHF, history of focal sclerosing/crescentic GN-ANCA positive and CKD stage III. He was hospitalized from 05/02/2019-05/07/2019 due to gross hematuria. CT showed a 7.4 cm left lower pole renal mass concerning for RCC.  Patient also has necrotic lymphadenopathy in the mediastinum and the right supra hilar region which were highly suspicious for metastatic disease.  Bilateral lung nodules, also concerning for metastatic lung disease.  Right retrocrural lymphadenopathy with upper normal left periaortic lymph node. Patient was recommended for cytoreductive radical nephrectomy.  With history of ANCA positive vasculitis, also recommend thoracic lymphadenopathy biopsy to confirm distal metastasis.  Patient agreed to radical nephrectomy and declined bronchoscopy biopsy. He underwent left radical nephrectomy on 05/05/2019. Pathology showed pT3a pNx, RCC, conventional clear cell type, grade 3 Patient was discharged home. Today he presents to discuss pathology and the management plan. He was accompanied by his wife and daughter. Patient reports that he uses hydrocodone for pain.  Pain around the surgical site has improved.  He occasionally has tingling sensation around the surgical site.  Denies any fever or chills, drainage from the surgical site.  # Interval CT chest abdomen pelvis was independently reviewed by me and discussed with patient. 01/12/2020, CT showed overall marked improvement.  Comparing to CT  scan in August 2021, continues to have treatment response.Mediastinal soft tissue nodule nearly completely resolved, right minor fissure nodule 8 mm, decreased in size.  Resolution of pleural fluid and thickening of the right chest.  Small pleural effusion in the left chest.  No new lung nodules.-Subtle variation of the muscle architecture of the left psoas muscle of unknown significance.-Continue observation.  He is asymptomatic. Subtle nodule in the soft tissue of the left flank.  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 He finished 14 days of albendazole plus praziquantel .   He has had few side effects including poor appetite, hair loss, weight loss, bloody stool, abdominal cramping or diarrhea. All symptoms have improved since he completed the treatment. 03/16/2020 he finished tapering course of steroids  # It has been noted that his glucose level has been elevated in December 2021, even before he was started on prednisone treatments. Dr. Steva Ready has obtained A1c which came back elevated at 8.7.  # 04/30/2020, CT chest abdomen pelvis without contrast showed stable disease.   #04/30/2020, axitinib and immunotherapy were held due to diarrhea and fatigue. #05/15/2020 resumed on immunotherapy. 07/31/2020, CT chest abdomen pelvis showed stable disease 09/11/2020 resumed axitinib 5 mg twice daily, stopped on 10/23/2020 due to diarrhea. Continued on Keytruda treatments.  #Intermittent orthopnea, symptom has resolved.  Continue Lasix Pre-existing diastolic CHF, 1/69/4503 LVEF 60 to 65%.   Recommend him to follow-up with cardiology-he is has not able to follow-up with cardiology due to lack of insurance coverage.  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  Patient has been referred to orthopedic surgeon in [place] which he is not able to go due to transportation.  He does not have insurance as well.  He is currently asymptomatic.  Monitor.  11/28/2020, CT  abdomen pelvis  without contrast showed stable examination.  No evidence of new or progressive disease in the chest abdomen pelvis.  Stable to minimally decreased size of 5 mm pulmonary nodule in the minor fissure in the right chest.  Sigmoid colon diverticulosis without findings of acute diverticulitis.  Continued on immunotherapy with Keytruda every 3 weeks 01/17/2021 Beryle Flock was held due to transaminitis, immunotherapy induced liver toxicities. 02/20/2021, resumed Keytruda. Axitinib continues to be held.  03/08/2021, CT scan showed NED  INTERVAL HISTORY Mark Donaldson is a 61 y.o. male who has above history reviewed by me today presents for follow up visit for management of metastatic RCC  Patient is Spanish-speaking.   Spanish interpreter presented the entire encounter for translation Patient reports feeling well.  No new complaints.  Bilateral hip pain, he is interested in establishing with orthopedic surgeon.   Review of systems- Review of Systems  Constitutional:  Negative for appetite change, chills, fatigue, fever and unexpected weight change.  HENT:   Negative for voice change.   Eyes:  Negative for eye problems and icterus.  Respiratory:  Negative for chest tightness and cough.   Cardiovascular:  Negative for chest pain and leg swelling.  Gastrointestinal:  Negative for abdominal distention, abdominal pain and diarrhea.  Endocrine: Negative for hot flashes.  Genitourinary:  Negative for difficulty urinating, dysuria and frequency.   Musculoskeletal:  Positive for arthralgias.  Skin:  Negative for itching and rash.  Neurological:  Negative for light-headedness and numbness.  Hematological:  Negative for adenopathy. Does not bruise/bleed easily.  Psychiatric/Behavioral:  Negative for confusion.    No Known Allergies  Patient Active Problem List   Diagnosis Date Noted   Transaminitis 01/17/2021   History of CHF (congestive heart failure) 05/01/2020   Hyperglycemia 02/28/2020   Stage  3b chronic kidney disease ([place]) 10/04/2019   Brain lesion 10/04/2019   Encounter for antineoplastic immunotherapy 07/19/2019   Encounter for antineoplastic chemotherapy 07/19/2019   CNS lesion 07/07/2019   Anemia in stage 3b chronic kidney disease ([place]) 06/07/2019   Goals of care, counseling/discussion 05/23/2019   Renal cell carcinoma of left kidney ([place]) 05/23/2019   Renal cell carcinoma ([place]) 05/23/2019   Palliative care encounter    History of vasculitis    Thoracic lymphadenopathy    Lung nodule    CAD S/P percutaneous coronary angioplasty 05/02/2019   Kidney mass 05/02/2019   Hydronephrosis of left kidney 05/02/2019   UTI (urinary tract infection) 05/02/2019   AKI (acute kidney injury) ([place]) 05/02/2019   Gross hematuria 05/02/2019   Chronic diastolic CHF (congestive heart failure) ([place]) 05/02/2019   Sepsis ([place]) 05/02/2019   Severe sepsis ([place]) 05/02/2019   Preop cardiovascular exam    Hyperlipidemia 05/21/2016   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 ([place]) 05/21/2016   Prediabetes 05/21/2016   Alcohol abuse 05/18/2016   Essential hypertension 05/18/2016   Vasculitis, 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[place]) 05/18/2016   Shortness of breath 05/17/2016     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HCC)    CAD (coronary artery disease)    Essential hypertension    HFrEF (heart failure with reduced ejection fraction) (HCC)    Hyperlipidemia LDL goal <70    Ischemic cardiomyopathy    Renal cell carcinoma ([place]) 05/23/2019   Renal disorder      Past Surgical History:  Procedure Laterality Date   CARDIAC SURGERY     cardiac cath with 2 stent placement   LAPAROSCOPIC NEPHRECTOMY, HAND ASSISTED Left  05/05/2019   Procedure: HAND ASSISTED LAPAROSCOPIC NEPHRECTOMY;  Surgeon: Hollice Espy, MD;  Location: [facility] ORS;  Service: Urology;  Laterality: Left;    Social History   Socioeconomic History   Marital status: Married    Spouse name: Not on file    Number of children: Not on file   Years of education: Not on file   Highest education level: Not on file  Occupational History   Not on file  Tobacco Use   Smoking status: Never   Smokeless tobacco: Never  Substance and Sexual Activity   Alcohol use: Yes   Drug use: Not on file   Sexual activity: Not on file  Other Topics Concern   Not on file  Social History Narrative   Not on file   Social Determinants of Health   Financial Resource Strain: Not on file  Food Insecurity: Not on file  Transportation Needs: Not on file  Physical Activity: Not on file  Stress: Not on file  Social Connections: Not on file  Intimate Partner Violence: Not on file     Family History  Problem Relation Age of Onset   Cancer Mother    Blindness Brother      Current Outpatient Medications:    aspirin 81 MG EC tablet, Take 81 mg by mouth daily. Swallow whole., Disp: , Rfl:    atorvastatin (LIPITOR) 80 MG tablet, Take 80 mg by mouth daily., Disp: , Rfl:    clopidogrel (PLAVIX) 75 MG tablet, Take 75 mg by mouth daily., Disp: , Rfl:    furosemide (LASIX) 20 MG tablet, Take 1 tablet (20 mg total) by mouth daily., Disp: 30 tablet, Rfl: 3   KEYTRUDA 100 MG/4ML SOLN, INJECT '200MG'$  INTRAVENOUSLY EVERY 3 WEEKS, Disp: 8 mL, Rfl: 11   lisinopril (ZESTRIL) 10 MG tablet, Take 10 mg by mouth daily., Disp: , Rfl:    metoprolol succinate (TOPROL-XL) 100 MG 24 hr tablet, Take 1.5 tablets by mouth daily., Disp: , Rfl:    loperamide (IMODIUM) 2 MG capsule, Take 1 capsule (2 mg total) by mouth See admin instructions. Take 2 tablets with onset of diarrhea, then 1 tablet for every 2 hours until diarrhea stops. Maximum 8 tablets per 24 hours. (Patient not taking: Reported on 11/13/2020), Disp: 60 capsule, Rfl: 2   Physical exam:  Vitals:   06/12/21 0949  BP: (!) 139/97  Pulse: 67  Resp: 18  Temp: (!) 97.1 F (36.2 C)  Weight: 164 lb 8 oz (74.6 kg)    Physical Exam Constitutional:      General: He is not in  acute distress.    Appearance: He is not diaphoretic.  HENT:     Head: Normocephalic and atraumatic.     Nose: Nose normal.     Mouth/Throat:     Pharynx: No oropharyngeal exudate.  Eyes:     General: No scleral icterus.    Pupils: Pupils are equal, round, and reactive to light.  Cardiovascular:     Rate and Rhythm: Normal rate and regular rhythm.     Heart sounds: No murmur heard. Pulmonary:     Effort: Pulmonary effort is normal. No respiratory distress.     Breath sounds: No rales.  Chest:     Chest wall: No tenderness.  Abdominal:     General: There is no distension.     Palpations: Abdomen is soft.     Tenderness: There is no abdominal tenderness.  Musculoskeletal:        General:  Normal range of motion.     Cervical back: Normal range of motion and neck supple.  Skin:    General: Skin is warm and dry.     Findings: No erythema.  Neurological:     Mental Status: He is alert and oriented to person, place, and time.     Cranial Nerves: No cranial nerve deficit.     Motor: No abnormal muscle tone.     Coordination: Coordination normal.  Psychiatric:        Mood and Affect: Affect normal.          Latest Ref Rng & Units 05/22/2021    8:42 AM  CMP  Glucose 70 - 99 mg/dL 110    BUN 6 - 20 mg/dL 48    Creatinine 0.61 - 1.24 mg/dL 2.32    Sodium 135 - 145 mmol/L 134    Potassium 3.5 - 5.1 mmol/L 4.7    Chloride 98 - 111 mmol/L 105    CO2 22 - 32 mmol/L 22    Calcium 8.9 - 10.3 mg/dL 8.9    Total Protein 6.5 - 8.1 g/dL 6.8    Total Bilirubin 0.3 - 1.2 mg/dL 0.6    Alkaline Phos 38 - 126 U/L 109    AST 15 - 41 U/L 30    ALT 0 - 44 U/L 30        Latest Ref Rng & Units 06/12/2021    9:35 AM  CBC  WBC 4.0 - 10.5 K/uL 8.3    Hemoglobin 13.0 - 17.0 g/dL 12.1    Hematocrit 39.0 - 52.0 % 39.2    Platelets 150 - 400 K/uL 174      RADIOGRAPHIC STUDIES: I have personally reviewed the radiological images as listed and agreed with the findings in the report. CT CHEST  ABDOMEN PELVIS WO CONTRAST  Result Date: 05/29/2021 CLINICAL DATA:  Left renal cell carcinoma status post nephrectomy April 2021. * Tracking Code: BO * EXAM: CT CHEST, ABDOMEN AND PELVIS WITHOUT CONTRAST TECHNIQUE: Multidetector CT imaging of the chest, abdomen and pelvis was performed following the standard protocol without IV contrast. RADIATION DOSE REDUCTION: This exam was performed according to the departmental dose-optimization program which includes automated exposure control, adjustment of the mA and/or kV according to patient size and/or use of iterative reconstruction technique. COMPARISON:  CT abdomen pelvis 03/06/2021 and CT chest 11/28/2020. FINDINGS: CT CHEST FINDINGS Cardiovascular: Atherosclerotic calcification the aortic valve and coronary arteries. Heart size normal. No pericardial effusion. Mediastinum/Nodes: No pathologically enlarged mediastinal or axillary lymph nodes. Hilar regions are difficult to definitively evaluate without IV contrast. Esophagus is grossly unremarkable. Lungs/Pleura: 5 mm nodule along the minor fissure (4/77), unchanged. No new pulmonary nodules. Mild cylindrical bronchiectasis. No pleural fluid. Airway is unremarkable. Musculoskeletal: No worrisome lytic or sclerotic lesions. CT ABDOMEN PELVIS FINDINGS Hepatobiliary: Liver and gallbladder are unremarkable. No biliary ductal dilatation. Pancreas: Negative. Spleen: Negative. Adrenals/Urinary Tract: Adrenal glands and right kidney are unremarkable. Right ureter is decompressed. Left nephrectomy. Bland surgical bed. Bladder wall thickening. Stomach/Bowel: Stomach, small bowel, appendix and colon are unremarkable. Favor focal peristalsis in the mid descending colon (2/56) as finding not present on 03/06/2021. Vascular/Lymphatic: Vascular structures are unremarkable. No pathologically enlarged lymph nodes. Reproductive: Prostate is minimally prominent. There may be small bilateral scrotal hydroceles, left greater than right.  Other: Small bilateral inguinal hernias contain fat. No free fluid. Mesenteries and peritoneum are otherwise unremarkable. Musculoskeletal: No worrisome lytic or sclerotic lesions. Mild changes of avascular necrosis  in the femoral heads. IMPRESSION: 1. No evidence of recurrent or metastatic disease. 2. Prostate appears minimally prominent. Bladder wall thickening may be due to an element of outlet obstruction. 3. Mild changes of avascular necrosis in the femoral heads. 4. Aortic atherosclerosis (ICD10-I70.0). Coronary artery calcification. Electronically Signed   By: Lorin Picket M.D.   On: 05/29/2021 08:51     Assessment and plan-  Patient is a 61 y.o. male history of CAD, status post stent, CHF, history of ANCA positive sclerosing/crescentic GN, chronic kidney disease stage III presents for treatments of stage IV renal cell carcinoma 1. Renal cell carcinoma of left kidney (HCC)   2. Encounter for antineoplastic immunotherapy   3. Stage 3b chronic kidney disease ([place])   4. Avascular necrosis ([place])    Cancer Staging  Renal cell carcinoma of left kidney (HCC) Staging form: Kidney, AJCC 8th Edition - Pathologic: Stage IV (pT3a, pNX, cM1) - Signed by Earlie Server, MD on 05/23/2019   # Stage IV renal cell carcinoma Labs reviewed and discussed with patient Proceed with scheduled Previously did not tolerate axitinib. 05/29/2021, CT scan showed no evidence of recurrence or metastatic disease.  Prostate appears minimally prominent.  Bladder wall thickening may be due to an element of outlet obstruction.  Mild changes of avascular necrosis in the femoral heads.  Aortic atherosclerosis Imaging was reviewed by me and discussed with patient. Continue current regimen  #Avascular necrosis of bilateral femoral heads, patient is interested in orthopedic surgeon evaluation.  Will refer. #Brain lesion-  neurocysticercosis finished therapy.  Follow-up with ID There was a new lesion.  Per discussion with  Dr.Ravishanka, she recommends no additional treatments at this point.  Continue monitor.  Future biopsy could be considered if lesion is increasing in size. Patient is asymptomatic.  Plan to repeat MRI brain in the future.  #CKD, avoid nephrotoxins. Encourage oral hydration.  #History of CHF, hypertension, continue follow-up with primary care provider. Spanish interpreter presented for the entire encounter for translation. We spent sufficient time to discuss many aspect of care, questions were answered to patient's satisfaction.  Follow up in 3 weeks.   Earlie Server, MD, PhD  06/12/2021

## 2021-06-20 ENCOUNTER — Ambulatory Visit (INDEPENDENT_AMBULATORY_CARE_PROVIDER_SITE_OTHER): Payer: Self-pay | Admitting: Orthopaedic Surgery

## 2021-06-20 ENCOUNTER — Ambulatory Visit (INDEPENDENT_AMBULATORY_CARE_PROVIDER_SITE_OTHER): Payer: Self-pay

## 2021-06-20 ENCOUNTER — Encounter: Payer: Self-pay | Admitting: Orthopaedic Surgery

## 2021-06-20 VITALS — Ht 65.5 in | Wt 164.6 lb

## 2021-06-20 DIAGNOSIS — M87051 Idiopathic aseptic necrosis of right femur: Secondary | ICD-10-CM

## 2021-06-20 DIAGNOSIS — M87052 Idiopathic aseptic necrosis of left femur: Secondary | ICD-10-CM

## 2021-06-20 NOTE — Progress Notes (Signed)
Office Visit Note   Patient: Mark Donaldson           Date of Birth: [birth year]           MRN: [id number] Visit Date: 06/20/2021              Requested by: [facility], [facility] [address] [place],  [place] [address] PCP: [facility], [place]: Visit Diagnoses:  1. Avascular necrosis of bones of both hips (HCC)     Plan: Impression is incidental avascular necrosis bilateral hips.  Patient is currently asymptomatic.  Discussed through interpreter that no intervention is necessary at this time.  Follow-up if he develops symptoms.  Follow-Up Instructions: No follow-ups on file.   Orders:  Orders Placed This Encounter  Procedures   XR Pelvis 1-2 Views   No orders of the defined types were placed in this encounter.     Procedures: No procedures performed   Clinical Data: No additional findings.   Subjective: Chief Complaint  Patient presents with   Left Hip - Pain   Right Hip - Pain    HPI Patient is a referral from oncologist for incidental finding of avascular necrosis hips on CT scan.  Patient reports no symptoms in his hips.  Review of Systems  Constitutional: Negative.   All other systems reviewed and are negative.    Objective: Vital Signs: Ht 5' 5.5" (1.664 m)   Wt 164 lb 9.6 oz (74.7 kg)   BMI 26.97 kg/m   Physical Exam Vitals and nursing note reviewed.  Constitutional:      Appearance: He is well-developed.  HENT:     Head: Normocephalic and atraumatic.  Eyes:     Pupils: Pupils are equal, round, and reactive to light.  Pulmonary:     Effort: Pulmonary effort is normal.  Abdominal:     Palpations: Abdomen is soft.  Musculoskeletal:        General: Normal range of motion.     Cervical back: Neck supple.  Skin:    General: Skin is warm.  Neurological:     Mental Status: He is alert and oriented to person, place, and time.  Psychiatric:        Behavior: Behavior normal.         Thought Content: Thought content normal.        Judgment: Judgment normal.     Ortho Exam Examination bilateral hips is completely normal. Specialty Comments:  No specialty comments available.  Imaging: No results found.   PMFS History: Patient Active Problem List   Diagnosis Date Noted   Avascular necrosis of bones of both hips ([place]) 06/20/2021   Transaminitis 01/17/2021   History of CHF (congestive heart failure) 05/01/2020   Hyperglycemia 02/28/2020   Stage 3b chronic kidney disease ([place]) 10/04/2019   Brain lesion 10/04/2019   Encounter for antineoplastic immunotherapy 07/19/2019   Encounter for antineoplastic chemotherapy 07/19/2019   CNS lesion 07/07/2019   Anemia in stage 3b chronic kidney disease ([place]) 06/07/2019   Goals of care, counseling/discussion 05/23/2019   Renal cell carcinoma of left kidney ([place]) 05/23/2019   Renal cell carcinoma ([place]) 05/23/2019   Palliative care encounter    History of vasculitis    Thoracic lymphadenopathy    Lung nodule    CAD S/P percutaneous coronary angioplasty 05/02/2019   Kidney mass 05/02/2019   Hydronephrosis of left kidney 05/02/2019   UTI (urinary tract infection) 05/02/2019   AKI (acute  kidney injury) ([place]) 05/02/2019   Gross hematuria 05/02/2019   Chronic diastolic CHF (congestive heart failure) ([place]) 05/02/2019   Sepsis ([place]) 05/02/2019   Severe sepsis ([place]) 05/02/2019   Preop cardiovascular exam    Hyperlipidemia 05/21/2016   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 ([place]) 05/21/2016   Prediabetes 05/21/2016   Alcohol abuse 05/18/2016   Essential hypertension 05/18/2016   Vasculitis, 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[place]) 05/18/2016   Shortness of breath 05/17/2016   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HCC)    CAD (coronary artery disease)    Essential hypertension    HFrEF (heart failure with reduced ejection fraction) (HCC)    Hyperlipidemia LDL goal <70    Ischemic cardiomyopathy     Renal cell carcinoma ([place]) 05/23/2019   Renal disorder     Family History  Problem Relation Age of Onset   Cancer Mother    Blindness Brother     Past Surgical History:  Procedure Laterality Date   CARDIAC SURGERY     cardiac cath with 2 stent placement   LAPAROSCOPIC NEPHRECTOMY, HAND ASSISTED Left 05/05/2019   Procedure: HAND ASSISTED LAPAROSCOPIC NEPHRECTOMY;  Surgeon: Hollice Espy, MD;  Location: [facility] ORS;  Service: Urology;  Laterality: Left;   Social History   Occupational History   Not on file  Tobacco Use   Smoking status: Never   Smokeless tobacco: Never  Substance and Sexual Activity   Alcohol use: Yes   Drug use: Not on file   Sexual activity: Not on file

## 2021-07-03 ENCOUNTER — Encounter: Payer: Self-pay | Admitting: Oncology

## 2021-07-03 ENCOUNTER — Inpatient Hospital Stay: Payer: Self-pay

## 2021-07-03 ENCOUNTER — Inpatient Hospital Stay: Payer: Self-pay | Attending: Oncology

## 2021-07-03 ENCOUNTER — Inpatient Hospital Stay (HOSPITAL_BASED_OUTPATIENT_CLINIC_OR_DEPARTMENT_OTHER): Payer: Self-pay | Admitting: Oncology

## 2021-07-03 DIAGNOSIS — N1832 Chronic kidney disease, stage 3b: Secondary | ICD-10-CM | POA: Insufficient documentation

## 2021-07-03 DIAGNOSIS — Z955 Presence of coronary angioplasty implant and graft: Secondary | ICD-10-CM | POA: Insufficient documentation

## 2021-07-03 DIAGNOSIS — Z8679 Personal history of other diseases of the circulatory system: Secondary | ICD-10-CM

## 2021-07-03 DIAGNOSIS — I2510 Atherosclerotic heart disease of native coronary artery without angina pectoris: Secondary | ICD-10-CM | POA: Insufficient documentation

## 2021-07-03 DIAGNOSIS — C642 Malignant neoplasm of left kidney, except renal pelvis: Secondary | ICD-10-CM

## 2021-07-03 DIAGNOSIS — I130 Hypertensive heart and chronic kidney disease with heart failure and stage 1 through stage 4 chronic kidney disease, or unspecified chronic kidney disease: Secondary | ICD-10-CM | POA: Insufficient documentation

## 2021-07-03 DIAGNOSIS — Z7982 Long term (current) use of aspirin: Secondary | ICD-10-CM | POA: Insufficient documentation

## 2021-07-03 DIAGNOSIS — D631 Anemia in chronic kidney disease: Secondary | ICD-10-CM | POA: Insufficient documentation

## 2021-07-03 DIAGNOSIS — Z5112 Encounter for antineoplastic immunotherapy: Secondary | ICD-10-CM | POA: Insufficient documentation

## 2021-07-03 DIAGNOSIS — M879 Osteonecrosis, unspecified: Secondary | ICD-10-CM | POA: Insufficient documentation

## 2021-07-03 DIAGNOSIS — Z5111 Encounter for antineoplastic chemotherapy: Secondary | ICD-10-CM

## 2021-07-03 DIAGNOSIS — I7782 Antineutrophilic cytoplasmic antibody (ANCA) vasculitis: Secondary | ICD-10-CM | POA: Insufficient documentation

## 2021-07-03 DIAGNOSIS — C7801 Secondary malignant neoplasm of right lung: Secondary | ICD-10-CM | POA: Insufficient documentation

## 2021-07-03 DIAGNOSIS — Z905 Acquired absence of kidney: Secondary | ICD-10-CM | POA: Insufficient documentation

## 2021-07-03 DIAGNOSIS — Z79899 Other long term (current) drug therapy: Secondary | ICD-10-CM | POA: Insufficient documentation

## 2021-07-03 DIAGNOSIS — C7802 Secondary malignant neoplasm of left lung: Secondary | ICD-10-CM | POA: Insufficient documentation

## 2021-07-03 LAB — CBC WITH DIFFERENTIAL/PLATELET
Abs Immature Granulocytes: 0.03 10*3/uL (ref 0.00–0.07)
Basophils Absolute: 0 10*3/uL (ref 0.0–0.1)
Basophils Relative: 1 %
Eosinophils Absolute: 0.4 10*3/uL (ref 0.0–0.5)
Eosinophils Relative: 5 %
HCT: 38.4 % — ABNORMAL LOW (ref 39.0–52.0)
Hemoglobin: 11.9 g/dL — ABNORMAL LOW (ref 13.0–17.0)
Immature Granulocytes: 0 %
Lymphocytes Relative: 24 %
Lymphs Abs: 2 10*3/uL (ref 0.7–4.0)
MCH: 25.1 pg — ABNORMAL LOW (ref 26.0–34.0)
MCHC: 31 g/dL (ref 30.0–36.0)
MCV: 80.8 fL (ref 80.0–100.0)
Monocytes Absolute: 1 10*3/uL (ref 0.1–1.0)
Monocytes Relative: 12 %
Neutro Abs: 5.1 10*3/uL (ref 1.7–7.7)
Neutrophils Relative %: 58 %
Platelets: 162 10*3/uL (ref 150–400)
RBC: 4.75 MIL/uL (ref 4.22–5.81)
RDW: 15 % (ref 11.5–15.5)
WBC: 8.7 10*3/uL (ref 4.0–10.5)
nRBC: 0 % (ref 0.0–0.2)

## 2021-07-03 LAB — COMPREHENSIVE METABOLIC PANEL
ALT: 22 U/L (ref 0–44)
AST: 26 U/L (ref 15–41)
Albumin: 3.9 g/dL (ref 3.5–5.0)
Alkaline Phosphatase: 113 U/L (ref 38–126)
Anion gap: 8 (ref 5–15)
BUN: 48 mg/dL — ABNORMAL HIGH (ref 6–20)
CO2: 22 mmol/L (ref 22–32)
Calcium: 9 mg/dL (ref 8.9–10.3)
Chloride: 104 mmol/L (ref 98–111)
Creatinine, Ser: 2.38 mg/dL — ABNORMAL HIGH (ref 0.61–1.24)
GFR, Estimated: 31 mL/min — ABNORMAL LOW (ref >60)
Glucose, Bld: 132 mg/dL — ABNORMAL HIGH (ref 70–99)
Potassium: 5.1 mmol/L (ref 3.5–5.1)
Sodium: 134 mmol/L — ABNORMAL LOW (ref 135–145)
Total Bilirubin: 0.6 mg/dL (ref 0.3–1.2)
Total Protein: 7.1 g/dL (ref 6.5–8.1)

## 2021-07-03 MED ORDER — SODIUM CHLORIDE 0.9 % IV SOLN
Freq: Once | INTRAVENOUS | Status: AC
  Filled 2021-07-03: qty 250

## 2021-07-03 MED ORDER — SODIUM CHLORIDE 0.9 % IV SOLN
200.0000 mg | Freq: Once | INTRAVENOUS | Status: AC
  Administered 2021-07-03: 200 mg via INTRAVENOUS
  Filled 2021-07-03: qty 200

## 2021-07-03 NOTE — Progress Notes (Signed)
Pt was started on Flomax and since then he started feeling some lightheadedness, he is unsure if it is a side effect from medication. He was seen by orthopedic and states that he was told that there was nothing wrong. He would like lift restriction to be increased to 35 pounds so he can return to work.

## 2021-07-03 NOTE — Assessment & Plan Note (Signed)
Stage IV renal cell carcinoma, Previously did not tolerate axitinib. Labs reviewed and discussed with patient Proceed with Keytruda.    

## 2021-07-03 NOTE — Assessment & Plan Note (Signed)
Chemotherapy plan as listed above 

## 2021-07-03 NOTE — Progress Notes (Signed)
Patient tolerated Keytruda infusion well, no questions/concerns voiced. Patient stable at discharge. AVS given.   ?

## 2021-07-03 NOTE — Progress Notes (Signed)
Hematology/Oncology Progress note [phone] [phone]    Patient Care Team: Center, [facility] as PCP - General (General Practice) Earlie Server, MD as Consulting Physician (Oncology)   Name of the patient: Mark Donaldson  [id number]  04-Jun-1960  Date of visit: 07/03/21  ASSESSMENT & PLAN:   Renal cell carcinoma of left kidney ([place]) Stage IV renal cell carcinoma, Previously did not tolerate axitinib. Labs reviewed and discussed with patient Proceed with Keytruda.     Encounter for antineoplastic chemotherapy Chemotherapy plan as listed above.   Stage 3b chronic kidney disease ([place]) avoid nephrotoxins. Encourage oral hydration  History of CHF (congestive heart failure) Follow up with primary care provider.  Recently started Flomax, he felt lightheaded, could be side effects, recommend patient to further discuss with pcp.   No orders of the defined types were placed in this encounter.  Follow up lab MD 3 weeks Keytruda All questions were answered. The patient knows to call the clinic with any problems, questions or concerns.  Earlie Server, MD, PhD [facility] Hematology Oncology 07/03/2021   PERTINENT HISTORY-  61 y.o. male presents for follow-up of renal cell carcinoma treatments. Oncology History  Renal cell carcinoma of left kidney ([place])  05/23/2019 Initial Diagnosis   Renal cell carcinoma of left kidney   -05/02/2019-05/07/2019 due to gross hematuria. CT showed a 7.4 cm left lower pole renal mass concerning for RCC.  Patient also has necrotic lymphadenopathy in the mediastinum and the right supra hilar region which were highly suspicious for metastatic disease.  Bilateral lung nodules, also concerning for metastatic lung disease.  Right retrocrural lymphadenopathy with upper normal left periaortic lymph node. Patient was recommended for cytoreductive radical nephrectomy.  With history of ANCA positive vasculitis, also recommend  thoracic lymphadenopathy biopsy to confirm distal metastasis.  Patient agreed to radical nephrectomy and declined bronchoscopy biopsy. He underwent left radical nephrectomy on 05/05/2019. Pathology showed pT3a pNx, RCC, conventional clear cell type, grade 3    05/23/2019 Cancer Staging   Staging form: Kidney, AJCC 8th Edition - Pathologic: Stage IV (pT3a, pNX, cM1) - Signed by Earlie Server, MD on 05/23/2019   06/07/2019 -  Chemotherapy   Keytruda + Axitinib.  06/07/19 He started Axitinib '5mg'$  BID, stopped on 07/25/2020 due to AKI, resumed on 10/04/2019, stopped again on 05/01/2020 due to diarrhea. 09/11/2020 resumed axitinib 5 mg twice daily, stopped on 10/23/2020 due to diarrhea.  01/17/2021 Keytruda was held due to transaminitis, immunotherapy induced liver toxicities. 02/20/2021, resumed Keytruda. Axitinib continues to be held.   01/12/2020 Imaging   CT showed overall marked improvement.  Comparing to CT scan in August 2021, continues to have treatment response.Mediastinal soft tissue nodule nearly completely resolved, right minor fissure nodule 8 mm, decreased in size.  Resolution of pleural fluid and thickening of the right chest.  Small pleural effusion in the left chest.  No new lung nodules.-Subtle variation of the muscle architecture of the left psoas muscle of unknown significance   04/30/2020 Imaging   CT chest abdomen pelvis without contrast showed stable disease   11/28/2020 Imaging   CT abdomen pelvis without contrast showed stable examination.  No evidence of new or progressive disease in the chest abdomen pelvis.  Stable to minimally decreased size of 5 mm pulmonary nodule in the minor fissure in the right chest.  Sigmoid colon diverticulosis without findings of acute diverticulitis.   03/08/2021 Imaging   CT scan showed NED   07/31/2021 Imaging   CT chest abdomen  pelvis showed stable disease   Renal cell carcinoma ([place])  05/23/2019 Initial Diagnosis   Renal cell carcinoma ([place])    06/07/2019 -  Chemotherapy   Patient is on Treatment Plan :  Pembrolizumab q21d        Patient has past medical history CAD status post PCI to LAD in 2018, on aspirin and Plavix, CHF, history of focal sclerosing/crescentic GN-ANCA positive and CKD stage III. 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 He finished 14 days of albendazole plus praziquantel .   He has had few side effects including poor appetite, hair loss, weight loss, bloody stool, abdominal cramping or diarrhea. All symptoms have improved since he completed the treatment. 03/16/2020 he finished tapering course of steroids  #Intermittent orthopnea,Pre-existing diastolic CHF, 09/11/5619 LVEF 60 to 65%.   Recommend him to follow-up with cardiology-he is has not able to follow-up with cardiology due to lack of insurance coverage.  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  Patient has been referred to orthopedic surgeon in [place] which he is not able to go due to transportation.  He does not have insurance as well.  He is currently asymptomatic.  Monitor.  Patient is Spanish-speaking.   Spanish interpreter presented the entire encounter for translation Bilateral hip pain, recently seen by orthopedic surgeon. Xray hip was obtained, result is pending.    Review of systems- Review of Systems  Constitutional:  Negative for appetite change, chills, fatigue, fever and unexpected weight change.  HENT:   Negative for voice change.   Eyes:  Negative for eye problems and icterus.  Respiratory:  Negative for chest tightness and cough.   Cardiovascular:  Negative for chest pain and leg swelling.  Gastrointestinal:  Negative for abdominal distention, abdominal pain and diarrhea.  Endocrine: Negative for hot flashes.  Genitourinary:  Negative for difficulty urinating, dysuria and frequency.   Musculoskeletal:  Positive for arthralgias.  Skin:  Negative for itching and rash.  Neurological:  Negative for  light-headedness and numbness.  Hematological:  Negative for adenopathy. Does not bruise/bleed easily.  Psychiatric/Behavioral:  Negative for confusion.     No Known Allergies  Patient Active Problem List   Diagnosis Date Noted   Renal cell carcinoma of left kidney ([place]) 05/23/2019    Priority: High   Encounter for antineoplastic chemotherapy 07/19/2019    Priority: Medium    Goals of care, counseling/discussion 05/23/2019    Priority: Low   Avascular necrosis of bones of both hips ([place]) 06/20/2021   Transaminitis 01/17/2021   History of CHF (congestive heart failure) 05/01/2020   Hyperglycemia 02/28/2020   Stage 3b chronic kidney disease ([place]) 10/04/2019   Brain lesion 10/04/2019   Encounter for antineoplastic immunotherapy 07/19/2019   CNS lesion 07/07/2019   Anemia in stage 3b chronic kidney disease ([place]) 06/07/2019   Renal cell carcinoma ([place]) 05/23/2019   Palliative care encounter    History of vasculitis    Thoracic lymphadenopathy    Lung nodule    CAD S/P percutaneous coronary angioplasty 05/02/2019   Kidney mass 05/02/2019   Hydronephrosis of left kidney 05/02/2019   UTI (urinary tract infection) 05/02/2019   AKI (acute kidney injury) ([place]) 05/02/2019   Gross hematuria 05/02/2019   Chronic diastolic CHF (congestive heart failure) ([place]) 05/02/2019   Sepsis ([place]) 05/02/2019   Severe sepsis ([place]) 05/02/2019   Preop cardiovascular exam    Hyperlipidemia 05/21/2016   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) 05/21/2016   Prediabetes 05/21/2016   Alcohol abuse  05/18/2016   Essential hypertension 05/18/2016   Vasculitis, 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[place]) 05/18/2016   Shortness of breath 05/17/2016     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[facility])    CAD (coronary artery disease)    Essential hypertension    HFrEF (heart failure with reduced ejection fraction) ([place])    Hyperlipidemia LDL goal <70    Ischemic cardiomyopathy    Renal  cell carcinoma ([place]) 05/23/2019   Renal disorder      Past Surgical History:  Procedure Laterality Date   CARDIAC SURGERY     cardiac cath with 2 stent placement   LAPAROSCOPIC NEPHRECTOMY, HAND ASSISTED Left 05/05/2019   Procedure: HAND ASSISTED LAPAROSCOPIC NEPHRECTOMY;  Surgeon: Hollice Espy, MD;  Location: [facility] ORS;  Service: Urology;  Laterality: Left;    Social History   Socioeconomic History   Marital status: Married    Spouse name: Not on file   Number of children: Not on file   Years of education: Not on file   Highest education level: Not on file  Occupational History   Not on file  Tobacco Use   Smoking status: Never   Smokeless tobacco: Never  Substance and Sexual Activity   Alcohol use: Yes   Drug use: Not on file   Sexual activity: Not on file  Other Topics Concern   Not on file  Social History Narrative   Not on file   Social Determinants of Health   Financial Resource Strain: Not on file  Food Insecurity: Not on file  Transportation Needs: Not on file  Physical Activity: Not on file  Stress: Not on file  Social Connections: Not on file  Intimate Partner Violence: Not on file     Family History  Problem Relation Age of Onset   Cancer Mother    Blindness Brother      Current Outpatient Medications:    aspirin 81 MG EC tablet, Take 81 mg by mouth daily. Swallow whole., Disp: , Rfl:    atorvastatin (LIPITOR) 80 MG tablet, Take 80 mg by mouth daily., Disp: , Rfl:    clopidogrel (PLAVIX) 75 MG tablet, Take 75 mg by mouth daily., Disp: , Rfl:    furosemide (LASIX) 20 MG tablet, Take 1 tablet (20 mg total) by mouth daily., Disp: 30 tablet, Rfl: 3   KEYTRUDA 100 MG/4ML SOLN, INJECT '200MG'$  INTRAVENOUSLY EVERY 3 WEEKS, Disp: 8 mL, Rfl: 11   lisinopril (ZESTRIL) 10 MG tablet, Take 10 mg by mouth daily., Disp: , Rfl:    metoprolol succinate (TOPROL-XL) 100 MG 24 hr tablet, Take 1.5 tablets by mouth daily., Disp: , Rfl:    tamsulosin (FLOMAX) 0.4 MG CAPS  capsule, Take 0.4 mg by mouth daily., Disp: , Rfl:    loperamide (IMODIUM) 2 MG capsule, Take 1 capsule (2 mg total) by mouth See admin instructions. Take 2 tablets with onset of diarrhea, then 1 tablet for every 2 hours until diarrhea stops. Maximum 8 tablets per 24 hours. (Patient not taking: Reported on 11/13/2020), Disp: 60 capsule, Rfl: 2   Physical exam:  Vitals:   07/03/21 0930  BP: 124/87  Pulse: 71  Resp: 18  Temp: (!) 97.3 F (36.3 C)  SpO2: 98%  Weight: 164 lb 3.2 oz (74.5 kg)    Physical Exam Constitutional:      General: He is not in acute distress.    Appearance: He is not diaphoretic.  HENT:     Head: Normocephalic and atraumatic.  Nose: Nose normal.     Mouth/Throat:     Pharynx: No oropharyngeal exudate.  Eyes:     General: No scleral icterus.    Pupils: Pupils are equal, round, and reactive to light.  Cardiovascular:     Rate and Rhythm: Normal rate and regular rhythm.     Heart sounds: No murmur heard. Pulmonary:     Effort: Pulmonary effort is normal. No respiratory distress.     Breath sounds: No rales.  Chest:     Chest wall: No tenderness.  Abdominal:     General: There is no distension.     Palpations: Abdomen is soft.     Tenderness: There is no abdominal tenderness.  Musculoskeletal:        General: Normal range of motion.     Cervical back: Normal range of motion and neck supple.  Skin:    General: Skin is warm and dry.     Findings: No erythema.  Neurological:     Mental Status: He is alert and oriented to person, place, and time.     Cranial Nerves: No cranial nerve deficit.     Motor: No abnormal muscle tone.     Coordination: Coordination normal.  Psychiatric:        Mood and Affect: Affect normal.           Latest Ref Rng & Units 07/03/2021    8:53 AM  CMP  Glucose 70 - 99 mg/dL 132   BUN 6 - 20 mg/dL 48   Creatinine 0.61 - 1.24 mg/dL 2.38   Sodium 135 - 145 mmol/L 134   Potassium 3.5 - 5.1 mmol/L 5.1   Chloride 98 -  111 mmol/L 104   CO2 22 - 32 mmol/L 22   Calcium 8.9 - 10.3 mg/dL 9.0   Total Protein 6.5 - 8.1 g/dL 7.1   Total Bilirubin 0.3 - 1.2 mg/dL 0.6   Alkaline Phos 38 - 126 U/L 113   AST 15 - 41 U/L 26   ALT 0 - 44 U/L 22       Latest Ref Rng & Units 07/03/2021    8:53 AM  CBC  WBC 4.0 - 10.5 K/uL 8.7   Hemoglobin 13.0 - 17.0 g/dL 11.9   Hematocrit 39.0 - 52.0 % 38.4   Platelets 150 - 400 K/uL 162     RADIOGRAPHIC STUDIES: I have personally reviewed the radiological images as listed and agreed with the findings in the report. XR Pelvis 1-2 Views  Result Date: 06/20/2021 Tiny sclerotic areas of AVN without collapse  CT CHEST ABDOMEN PELVIS WO CONTRAST  Result Date: 05/29/2021 CLINICAL DATA:  Left renal cell carcinoma status post nephrectomy April 2021. * Tracking Code: BO * EXAM: CT CHEST, ABDOMEN AND PELVIS WITHOUT CONTRAST TECHNIQUE: Multidetector CT imaging of the chest, abdomen and pelvis was performed following the standard protocol without IV contrast. RADIATION DOSE REDUCTION: This exam was performed according to the departmental dose-optimization program which includes automated exposure control, adjustment of the mA and/or kV according to patient size and/or use of iterative reconstruction technique. COMPARISON:  CT abdomen pelvis 03/06/2021 and CT chest 11/28/2020. FINDINGS: CT CHEST FINDINGS Cardiovascular: Atherosclerotic calcification the aortic valve and coronary arteries. Heart size normal. No pericardial effusion. Mediastinum/Nodes: No pathologically enlarged mediastinal or axillary lymph nodes. Hilar regions are difficult to definitively evaluate without IV contrast. Esophagus is grossly unremarkable. Lungs/Pleura: 5 mm nodule along the minor fissure (4/77), unchanged. No new pulmonary nodules. Mild cylindrical bronchiectasis.  No pleural fluid. Airway is unremarkable. Musculoskeletal: No worrisome lytic or sclerotic lesions. CT ABDOMEN PELVIS FINDINGS Hepatobiliary: Liver and  gallbladder are unremarkable. No biliary ductal dilatation. Pancreas: Negative. Spleen: Negative. Adrenals/Urinary Tract: Adrenal glands and right kidney are unremarkable. Right ureter is decompressed. Left nephrectomy. Bland surgical bed. Bladder wall thickening. Stomach/Bowel: Stomach, small bowel, appendix and colon are unremarkable. Favor focal peristalsis in the mid descending colon (2/56) as finding not present on 03/06/2021. Vascular/Lymphatic: Vascular structures are unremarkable. No pathologically enlarged lymph nodes. Reproductive: Prostate is minimally prominent. There may be small bilateral scrotal hydroceles, left greater than right. Other: Small bilateral inguinal hernias contain fat. No free fluid. Mesenteries and peritoneum are otherwise unremarkable. Musculoskeletal: No worrisome lytic or sclerotic lesions. Mild changes of avascular necrosis in the femoral heads. IMPRESSION: 1. No evidence of recurrent or metastatic disease. 2. Prostate appears minimally prominent. Bladder wall thickening may be due to an element of outlet obstruction. 3. Mild changes of avascular necrosis in the femoral heads. 4. Aortic atherosclerosis (ICD10-I70.0). Coronary artery calcification. Electronically Signed   By: Lorin Picket M.D.   On: 05/29/2021 08:51

## 2021-07-03 NOTE — Assessment & Plan Note (Signed)
Follow up with primary care provider.  Recently started Flomax, he felt lightheaded, could be side effects, recommend patient to further discuss with pcp.

## 2021-07-03 NOTE — Patient Instructions (Signed)
[facility]  Discharge Instructions: Thank you for choosing [facility] to provide your oncology and hematology care.  If you have a lab appointment with the Cancer Center, please go directly to the Cancer Center and check in at the registration area.  Wear comfortable clothing and clothing appropriate for easy access to any Portacath or PICC line.   We strive to give you quality time with your provider. You may need to reschedule your appointment if you arrive late (15 or more minutes).  Arriving late affects you and other patients whose appointments are after yours.  Also, if you miss three or more appointments without notifying the office, you may be dismissed from the clinic at the provider's discretion.      For prescription refill requests, have your pharmacy contact our office and allow 72 hours for refills to be completed.    Today you received the following chemotherapy and/or immunotherapy agents pembrolizumab.      To help prevent nausea and vomiting after your treatment, we encourage you to take your nausea medication as directed.  BELOW ARE SYMPTOMS THAT SHOULD BE REPORTED IMMEDIATELY: *FEVER GREATER THAN 100.4 F (38 C) OR HIGHER *CHILLS OR SWEATING *NAUSEA AND VOMITING THAT IS NOT CONTROLLED WITH YOUR NAUSEA MEDICATION *UNUSUAL SHORTNESS OF BREATH *UNUSUAL BRUISING OR BLEEDING *URINARY PROBLEMS (pain or burning when urinating, or frequent urination) *BOWEL PROBLEMS (unusual diarrhea, constipation, pain near the anus) TENDERNESS IN MOUTH AND THROAT WITH OR WITHOUT PRESENCE OF ULCERS (sore throat, sores in mouth, or a toothache) UNUSUAL RASH, SWELLING OR PAIN  UNUSUAL VAGINAL DISCHARGE OR ITCHING   Items with * indicate a potential emergency and should be followed up as soon as possible or go to the Emergency Department if any problems should occur.  Please show the CHEMOTHERAPY ALERT CARD or IMMUNOTHERAPY ALERT CARD at check-in  to the Emergency Department and triage nurse.  Should you have questions after your visit or need to cancel or reschedule your appointment, please contact [facility]  [phone] and follow the prompts.  Office hours are 8:00 a.m. to 4:30 p.m. Monday - Friday. Please note that voicemails left after 4:00 p.m. may not be returned until the following business day.  We are closed weekends and major holidays. You have access to a nurse at all times for urgent questions. Please call the main number to the clinic [phone] and follow the prompts.  For any non-urgent questions, you may also contact your provider using MyChart. We now offer e-Visits for anyone 18 and older to request care online for non-urgent symptoms. For details visit [email or web address].   Also download the MyChart app! Go to the app store, search "MyChart", open the app, select [facility], and log in with your MyChart username and password.  Masks are optional in the cancer centers. If you would like for your care team to wear a mask while they are taking care of you, please let them know. For doctor visits, patients may have with them one support person who is at least 60 years old. At this time, visitors are not allowed in the infusion area.   

## 2021-07-03 NOTE — Assessment & Plan Note (Signed)
avoid nephrotoxins. Encourage oral hydration 

## 2021-07-05 IMAGING — NM NM BONE WHOLE BODY
1 series · 2 of 2 positions shown · non-contrast
Comparison: None

Correlation: CT chest abdomen pelvis 05/03/2019

CLINICAL DATA: Renal cell carcinoma LEFT kidney, staging, LEFT
nephrectomy, BILATERAL shoulder and upper back pain, sometimes pain
in BILATERAL upper and lower extremities

EXAM:
NUCLEAR MEDICINE WHOLE BODY BONE SCAN
TECHNIQUE: Whole body anterior and posterior images were obtained approximately
3 hours after intravenous injection of radiopharmaceutical.
RADIOPHARMACEUTICALS:  22.76 mCi Aechnetium-XXm MDP IV

[Series 1000: 3 hr wholebody · 2.40mm/px · 2 of 2 frames shown]
[frame 1/2]
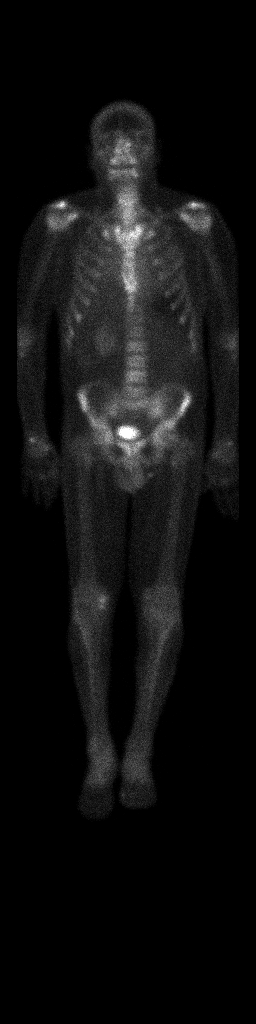
[frame 2/2]
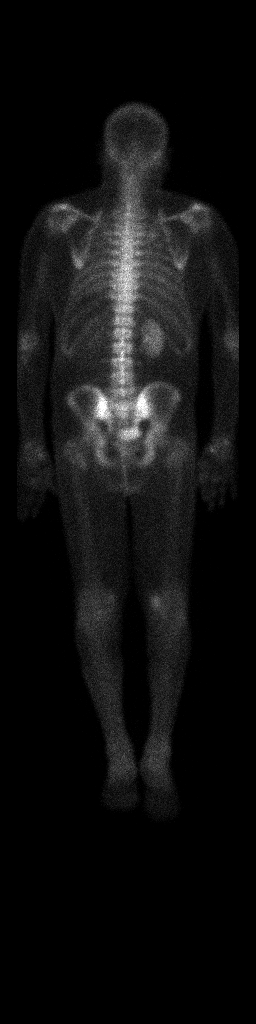

[2 of 2 positions shown; findings below may reference images not displayed]

FINDINGS: Uptake at multiple joints in the upper and lower extremities
bilaterally typically degenerative.

No worrisome sites of abnormal tracer localization are identified to
suggest osseous metastatic disease.

Prominent xiphoid process.

Interval LEFT nephrectomy since CT scan.

Urinary tract and soft tissue distribution of tracer otherwise
unremarkable.
IMPRESSION: No scintigraphic evidence of osseous metastatic disease.

## 2021-07-24 ENCOUNTER — Encounter: Payer: Self-pay | Admitting: Oncology

## 2021-07-24 ENCOUNTER — Inpatient Hospital Stay: Payer: Self-pay | Attending: Oncology

## 2021-07-24 ENCOUNTER — Inpatient Hospital Stay (HOSPITAL_BASED_OUTPATIENT_CLINIC_OR_DEPARTMENT_OTHER): Payer: Self-pay | Admitting: Oncology

## 2021-07-24 ENCOUNTER — Inpatient Hospital Stay: Payer: Self-pay

## 2021-07-24 VITALS — BP 106/81 | HR 72 | Temp 97.4°F | Resp 18 | Wt 164.2 lb

## 2021-07-24 DIAGNOSIS — Z7902 Long term (current) use of antithrombotics/antiplatelets: Secondary | ICD-10-CM | POA: Insufficient documentation

## 2021-07-24 DIAGNOSIS — Z79899 Other long term (current) drug therapy: Secondary | ICD-10-CM | POA: Insufficient documentation

## 2021-07-24 DIAGNOSIS — Z5112 Encounter for antineoplastic immunotherapy: Secondary | ICD-10-CM | POA: Insufficient documentation

## 2021-07-24 DIAGNOSIS — C642 Malignant neoplasm of left kidney, except renal pelvis: Secondary | ICD-10-CM

## 2021-07-24 DIAGNOSIS — Z5111 Encounter for antineoplastic chemotherapy: Secondary | ICD-10-CM

## 2021-07-24 DIAGNOSIS — Z905 Acquired absence of kidney: Secondary | ICD-10-CM | POA: Insufficient documentation

## 2021-07-24 DIAGNOSIS — N1832 Chronic kidney disease, stage 3b: Secondary | ICD-10-CM | POA: Insufficient documentation

## 2021-07-24 DIAGNOSIS — Z7982 Long term (current) use of aspirin: Secondary | ICD-10-CM | POA: Insufficient documentation

## 2021-07-24 DIAGNOSIS — I2510 Atherosclerotic heart disease of native coronary artery without angina pectoris: Secondary | ICD-10-CM | POA: Insufficient documentation

## 2021-07-24 DIAGNOSIS — I130 Hypertensive heart and chronic kidney disease with heart failure and stage 1 through stage 4 chronic kidney disease, or unspecified chronic kidney disease: Secondary | ICD-10-CM | POA: Insufficient documentation

## 2021-07-24 DIAGNOSIS — Z8679 Personal history of other diseases of the circulatory system: Secondary | ICD-10-CM

## 2021-07-24 DIAGNOSIS — Z955 Presence of coronary angioplasty implant and graft: Secondary | ICD-10-CM | POA: Insufficient documentation

## 2021-07-24 LAB — CBC WITH DIFFERENTIAL/PLATELET
Abs Immature Granulocytes: 0.03 10*3/uL (ref 0.00–0.07)
Basophils Absolute: 0 10*3/uL (ref 0.0–0.1)
Basophils Relative: 1 %
Eosinophils Absolute: 0.3 10*3/uL (ref 0.0–0.5)
Eosinophils Relative: 4 %
HCT: 36.6 % — ABNORMAL LOW (ref 39.0–52.0)
Hemoglobin: 11.5 g/dL — ABNORMAL LOW (ref 13.0–17.0)
Immature Granulocytes: 0 %
Lymphocytes Relative: 20 %
Lymphs Abs: 1.6 10*3/uL (ref 0.7–4.0)
MCH: 25.2 pg — ABNORMAL LOW (ref 26.0–34.0)
MCHC: 31.4 g/dL (ref 30.0–36.0)
MCV: 80.3 fL (ref 80.0–100.0)
Monocytes Absolute: 0.7 10*3/uL (ref 0.1–1.0)
Monocytes Relative: 9 %
Neutro Abs: 5.3 10*3/uL (ref 1.7–7.7)
Neutrophils Relative %: 66 %
Platelets: 162 10*3/uL (ref 150–400)
RBC: 4.56 MIL/uL (ref 4.22–5.81)
RDW: 16.1 % — ABNORMAL HIGH (ref 11.5–15.5)
WBC: 8 10*3/uL (ref 4.0–10.5)
nRBC: 0 % (ref 0.0–0.2)

## 2021-07-24 LAB — TSH: TSH: 1.703 u[IU]/mL (ref 0.350–4.500)

## 2021-07-24 LAB — COMPREHENSIVE METABOLIC PANEL
ALT: 19 U/L (ref 0–44)
AST: 26 U/L (ref 15–41)
Albumin: 3.8 g/dL (ref 3.5–5.0)
Alkaline Phosphatase: 88 U/L (ref 38–126)
Anion gap: 8 (ref 5–15)
BUN: 43 mg/dL — ABNORMAL HIGH (ref 6–20)
CO2: 22 mmol/L (ref 22–32)
Calcium: 8.8 mg/dL — ABNORMAL LOW (ref 8.9–10.3)
Chloride: 108 mmol/L (ref 98–111)
Creatinine, Ser: 2.15 mg/dL — ABNORMAL HIGH (ref 0.61–1.24)
GFR, Estimated: 35 mL/min — ABNORMAL LOW (ref >60)
Glucose, Bld: 135 mg/dL — ABNORMAL HIGH (ref 70–99)
Potassium: 4.5 mmol/L (ref 3.5–5.1)
Sodium: 138 mmol/L (ref 135–145)
Total Bilirubin: 0.9 mg/dL (ref 0.3–1.2)
Total Protein: 6.4 g/dL — ABNORMAL LOW (ref 6.5–8.1)

## 2021-07-24 LAB — T4, FREE: Free T4: 0.92 ng/dL (ref 0.61–1.12)

## 2021-07-24 MED ORDER — SODIUM CHLORIDE 0.9 % IV SOLN
200.0000 mg | Freq: Once | INTRAVENOUS | Status: AC
  Administered 2021-07-24: 200 mg via INTRAVENOUS
  Filled 2021-07-24: qty 8

## 2021-07-24 MED ORDER — SODIUM CHLORIDE 0.9 % IV SOLN
Freq: Once | INTRAVENOUS | Status: AC
  Filled 2021-07-24: qty 250

## 2021-07-24 NOTE — Assessment & Plan Note (Signed)
Stage IV renal cell carcinoma, Previously did not tolerate axitinib. Labs reviewed and discussed with patient Proceed with Keytruda.

## 2021-07-24 NOTE — Patient Instructions (Signed)
[facility]  Discharge Instructions: Thank you for choosing [facility] to provide your oncology and hematology care.  If you have a lab appointment with the Cancer Center, please go directly to the Cancer Center and check in at the registration area.  Wear comfortable clothing and clothing appropriate for easy access to any Portacath or PICC line.   We strive to give you quality time with your provider. You may need to reschedule your appointment if you arrive late (15 or more minutes).  Arriving late affects you and other patients whose appointments are after yours.  Also, if you miss three or more appointments without notifying the office, you may be dismissed from the clinic at the provider's discretion.      For prescription refill requests, have your pharmacy contact our office and allow 72 hours for refills to be completed.    Today you received the following chemotherapy and/or immunotherapy agents Keytruda      To help prevent nausea and vomiting after your treatment, we encourage you to take your nausea medication as directed.  BELOW ARE SYMPTOMS THAT SHOULD BE REPORTED IMMEDIATELY: *FEVER GREATER THAN 100.4 F (38 C) OR HIGHER *CHILLS OR SWEATING *NAUSEA AND VOMITING THAT IS NOT CONTROLLED WITH YOUR NAUSEA MEDICATION *UNUSUAL SHORTNESS OF BREATH *UNUSUAL BRUISING OR BLEEDING *URINARY PROBLEMS (pain or burning when urinating, or frequent urination) *BOWEL PROBLEMS (unusual diarrhea, constipation, pain near the anus) TENDERNESS IN MOUTH AND THROAT WITH OR WITHOUT PRESENCE OF ULCERS (sore throat, sores in mouth, or a toothache) UNUSUAL RASH, SWELLING OR PAIN  UNUSUAL VAGINAL DISCHARGE OR ITCHING   Items with * indicate a potential emergency and should be followed up as soon as possible or go to the Emergency Department if any problems should occur.  Please show the CHEMOTHERAPY ALERT CARD or IMMUNOTHERAPY ALERT CARD at check-in to  the Emergency Department and triage nurse.  Should you have questions after your visit or need to cancel or reschedule your appointment, please contact [facility]  [phone] and follow the prompts.  Office hours are 8:00 a.m. to 4:30 p.m. Monday - Friday. Please note that voicemails left after 4:00 p.m. may not be returned until the following business day.  We are closed weekends and major holidays. You have access to a nurse at all times for urgent questions. Please call the main number to the clinic [phone] and follow the prompts.  For any non-urgent questions, you may also contact your provider using MyChart. We now offer e-Visits for anyone 18 and older to request care online for non-urgent symptoms. For details visit [email or web address].   Also download the MyChart app! Go to the app store, search "MyChart", open the app, select [facility], and log in with your MyChart username and password.  Masks are optional in the cancer centers. If you would like for your care team to wear a mask while they are taking care of you, please let them know. For doctor visits, patients may have with them one support person who is at least 60 years old. At this time, visitors are not allowed in the infusion area.   

## 2021-07-24 NOTE — Assessment & Plan Note (Signed)
Chemotherapy plan as listed above 

## 2021-07-24 NOTE — Progress Notes (Signed)
Pt here for follow up. No new concerns voiced.   

## 2021-07-24 NOTE — Progress Notes (Signed)
Hematology/Oncology Progress note [phone] [phone]    Patient Care Team: Center, 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 Labs reviewed and discussed with patient Proceed with Keytruda.     Encounter for antineoplastic chemotherapy Chemotherapy plan as listed above.   History of CHF (congestive heart failure) Follow up with primary care provider.  No orders of the defined types were placed in this encounter.     Follow up lab MD 3 weeks Keytruda, obtain CT chest abdomen pelvis in mid August 2023. All questions were answered. The patient knows to call the clinic with any problems, questions or concerns.  Earlie Server, MD, PhD [facility] Hematology Oncology 07/24/2021   PERTINENT HISTORY-  61 y.o. male presents for follow-up of renal cell carcinoma treatments. Oncology History  Renal cell carcinoma of left kidney ([place])  05/23/2019 Initial Diagnosis   Renal cell carcinoma of left kidney   -05/02/2019-05/07/2019 due to gross hematuria. CT showed a 7.4 cm left lower pole renal mass concerning for RCC.  Patient also has necrotic lymphadenopathy in the mediastinum and the right supra hilar region which were highly suspicious for metastatic disease.  Bilateral lung nodules, also concerning for metastatic lung disease.  Right retrocrural lymphadenopathy with upper normal left periaortic lymph node. Patient was recommended for cytoreductive radical nephrectomy.  With history of ANCA positive vasculitis, also recommend thoracic lymphadenopathy biopsy to confirm distal metastasis.  Patient agreed to radical nephrectomy and declined bronchoscopy biopsy. He underwent  left radical nephrectomy on 05/05/2019. Pathology showed pT3a pNx, RCC, conventional clear cell type, grade 3    05/23/2019 Cancer Staging   Staging form: Kidney, AJCC 8th Edition - Pathologic: Stage IV (pT3a, pNX, cM1) - Signed by Earlie Server, MD on 05/23/2019   06/07/2019 -  Chemotherapy   Keytruda + Axitinib.  06/07/19 He started Axitinib '5mg'$  BID, stopped on 07/25/2020 due to AKI, resumed on 10/04/2019, stopped again on 05/01/2020 due to diarrhea. 09/11/2020 resumed axitinib 5 mg twice daily, stopped on 10/23/2020 due to diarrhea.  01/17/2021 Keytruda was held due to transaminitis, immunotherapy induced liver toxicities. 02/20/2021, resumed Keytruda. Axitinib continues to be held.   01/12/2020 Imaging   CT showed overall marked improvement.  Comparing to CT scan in August 2021, continues to have treatment response.Mediastinal soft tissue nodule nearly completely resolved, right minor fissure nodule 8 mm, decreased in size.  Resolution of pleural fluid and thickening of the right chest.  Small pleural effusion in the left chest.  No new lung nodules.-Subtle variation of the muscle architecture of the left psoas muscle of unknown significance   04/30/2020 Imaging   CT chest abdomen pelvis without contrast showed stable disease   11/28/2020 Imaging   CT abdomen pelvis without contrast showed stable examination.  No evidence of new or progressive disease in the chest abdomen pelvis.  Stable to minimally decreased size of 5 mm pulmonary nodule in the minor fissure in the right chest.  Sigmoid colon diverticulosis without findings of acute diverticulitis.   03/08/2021 Imaging   CT scan showed NED   07/31/2021 Imaging   CT chest abdomen pelvis showed stable disease   Renal cell carcinoma ([place])  05/23/2019 Initial Diagnosis   Renal cell carcinoma (  [place])   06/07/2019 -  Chemotherapy   Patient is on Treatment Plan :  Pembrolizumab q21d        Patient has past medical history CAD status post PCI to LAD in  2018, on aspirin and Plavix, CHF, history of focal sclerosing/crescentic GN-ANCA positive and CKD stage III. 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 He finished 14 days of albendazole plus praziquantel .   He has had few side effects including poor appetite, hair loss, weight loss, bloody stool, abdominal cramping or diarrhea. All symptoms have improved since he completed the treatment. 03/16/2020 he finished tapering course of steroids  #Intermittent orthopnea,Pre-existing diastolic CHF, 02/01/4172 LVEF 60 to 65%.   Recommend him to follow-up with cardiology-he is has not able to follow-up with cardiology due to lack of insurance coverage.  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  Patient has been referred to orthopedic surgeon in [place] which he is not able to go due to transportation.  He does not have insurance as well.  He is currently asymptomatic.  Monitor.  Patient is Spanish-speaking.   Spanish interpreter presented the entire encounter for translation Patient reports feeling well today.  No new complaints.  Denies nausea vomiting diarrhea   Review of systems- Review of Systems  Constitutional:  Negative for appetite change, chills, fatigue, fever and unexpected weight change.  HENT:   Negative for voice change.   Eyes:  Negative for eye problems and icterus.  Respiratory:  Negative for chest tightness and cough.   Cardiovascular:  Negative for chest pain and leg swelling.  Gastrointestinal:  Negative for abdominal distention, abdominal pain and diarrhea.  Endocrine: Negative for hot flashes.  Genitourinary:  Negative for difficulty urinating, dysuria and frequency.   Musculoskeletal:  Positive for arthralgias.  Skin:  Negative for itching and rash.  Neurological:  Negative for light-headedness and numbness.  Hematological:  Negative for adenopathy. Does not bruise/bleed easily.  Psychiatric/Behavioral:  Negative for confusion.     No Known  Allergies  Patient Active Problem List   Diagnosis Date Noted   Renal cell carcinoma of left kidney ([place]) 05/23/2019    Priority: High   Stage 3b chronic kidney disease ([place]) 10/04/2019    Priority: Medium    Encounter for antineoplastic chemotherapy 07/19/2019    Priority: Medium    Goals of care, counseling/discussion 05/23/2019    Priority: Low   Avascular necrosis of bones of both hips ([place]) 06/20/2021   Transaminitis 01/17/2021   History of CHF (congestive heart failure) 05/01/2020   Hyperglycemia 02/28/2020   Brain lesion 10/04/2019   Encounter for antineoplastic immunotherapy 07/19/2019   CNS lesion 07/07/2019   Anemia in stage 3b chronic kidney disease ([place]) 06/07/2019   Renal cell carcinoma ([place]) 05/23/2019   Palliative care encounter    History of vasculitis    Thoracic lymphadenopathy    Lung nodule    CAD S/P percutaneous coronary angioplasty 05/02/2019   Kidney mass 05/02/2019   Hydronephrosis of left kidney 05/02/2019   UTI (urinary tract infection) 05/02/2019   AKI (acute kidney injury) (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[place]) 05/18/2016  Shortness of breath 05/17/2016     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[place])    CAD (coronary artery disease)    Essential hypertension    HFrEF (heart failure with reduced ejection fraction) ([facility])    Hyperlipidemia LDL goal <70    Ischemic cardiomyopathy    Renal cell carcinoma ([place]) 05/23/2019   Renal disorder      Past Surgical History:  Procedure Laterality Date   CARDIAC SURGERY     cardiac cath with  2 stent placement   LAPAROSCOPIC NEPHRECTOMY, HAND ASSISTED Left 05/05/2019   Procedure: HAND ASSISTED LAPAROSCOPIC NEPHRECTOMY;  Surgeon: Hollice Espy, MD;  Location: [facility] ORS;  Service: Urology;  Laterality: Left;    Social History   Socioeconomic History   Marital status: Married    Spouse name: Not on file   Number of children: Not on file   Years of education: Not on file   Highest education level: Not on file  Occupational History   Not on file  Tobacco Use   Smoking status: Never   Smokeless tobacco: Never  Substance and Sexual Activity   Alcohol use: Yes   Drug use: Not on file   Sexual activity: Not on file  Other Topics Concern   Not on file  Social History Narrative   Not on file   Social Determinants of Health   Financial Resource Strain: Not on file  Food Insecurity: Not on file  Transportation Needs: Not on file  Physical Activity: Not on file  Stress: Not on file  Social Connections: Not on file  Intimate Partner Violence: Not on file     Family History  Problem Relation Age of Onset   Cancer Mother    Blindness Brother      Current Outpatient Medications:    aspirin 81 MG EC tablet, Take 81 mg by mouth daily. Swallow whole., Disp: , Rfl:    atorvastatin (LIPITOR) 80 MG tablet, Take 80 mg by mouth daily., Disp: , Rfl:    clopidogrel (PLAVIX) 75 MG tablet, Take 75 mg by mouth daily., Disp: , Rfl:    furosemide (LASIX) 20 MG tablet, Take 1 tablet (20 mg total) by mouth daily., Disp: 30 tablet, Rfl: 3   KEYTRUDA 100 MG/4ML SOLN, INJECT '200MG'$  INTRAVENOUSLY EVERY 3 WEEKS, Disp: 8 mL, Rfl: 11   lisinopril (ZESTRIL) 10 MG tablet, Take 10 mg by mouth daily., Disp: , Rfl:    metoprolol succinate (TOPROL-XL) 100 MG 24 hr tablet, Take 1.5 tablets by mouth daily., Disp: , Rfl:    CARDURA XL 4 MG 24 hr tablet, Take 4 mg by mouth daily. (Patient not taking: Reported on 07/24/2021), Disp: , Rfl:    loperamide (IMODIUM) 2 MG capsule, Take 1 capsule (2 mg total)  by mouth See admin instructions. Take 2 tablets with onset of diarrhea, then 1 tablet for every 2 hours until diarrhea stops. Maximum 8 tablets per 24 hours. (Patient not taking: Reported on 07/24/2021), Disp: 60 capsule, Rfl: 2   tamsulosin (FLOMAX) 0.4 MG CAPS capsule, Take 0.4 mg by mouth daily. (Patient not taking: Reported on 07/24/2021), Disp: , Rfl:    Physical exam:  Vitals:   07/24/21 1329  BP: 106/81  Pulse: 72  Resp: 18  Temp: (!) 97.4 F (36.3 C)  SpO2: 100%  Weight: 164 lb 3.2 oz (74.5 kg)    Physical Exam Constitutional:      General: He is not in acute distress.  Appearance: He is not diaphoretic.  HENT:     Head: Normocephalic and atraumatic.     Nose: Nose normal.     Mouth/Throat:     Pharynx: No oropharyngeal exudate.  Eyes:     General: No scleral icterus.    Pupils: Pupils are equal, round, and reactive to light.  Cardiovascular:     Rate and Rhythm: Normal rate and regular rhythm.     Heart sounds: No murmur heard. Pulmonary:     Effort: Pulmonary effort is normal. No respiratory distress.     Breath sounds: No rales.  Chest:     Chest wall: No tenderness.  Abdominal:     General: There is no distension.     Palpations: Abdomen is soft.     Tenderness: There is no abdominal tenderness.  Musculoskeletal:        General: Normal range of motion.     Cervical back: Normal range of motion and neck supple.  Skin:    General: Skin is warm and dry.     Findings: No erythema.  Neurological:     Mental Status: He is alert and oriented to person, place, and time.     Cranial Nerves: No cranial nerve deficit.     Motor: No abnormal muscle tone.     Coordination: Coordination normal.  Psychiatric:        Mood and Affect: Affect normal.           Latest Ref Rng & Units 07/24/2021    1:11 PM  CMP  Glucose 70 - 99 mg/dL 135   BUN 6 - 20 mg/dL 43   Creatinine 0.61 - 1.24 mg/dL 2.15   Sodium 135 - 145 mmol/L 138   Potassium 3.5 - 5.1 mmol/L 4.5    Chloride 98 - 111 mmol/L 108   CO2 22 - 32 mmol/L 22   Calcium 8.9 - 10.3 mg/dL 8.8   Total Protein 6.5 - 8.1 g/dL 6.4   Total Bilirubin 0.3 - 1.2 mg/dL 0.9   Alkaline Phos 38 - 126 U/L 88   AST 15 - 41 U/L 26   ALT 0 - 44 U/L 19       Latest Ref Rng & Units 07/24/2021    1:11 PM  CBC  WBC 4.0 - 10.5 K/uL 8.0   Hemoglobin 13.0 - 17.0 g/dL 11.5   Hematocrit 39.0 - 52.0 % 36.6   Platelets 150 - 400 K/uL 162     RADIOGRAPHIC STUDIES: I have personally reviewed the radiological images as listed and agreed with the findings in the report. XR Pelvis 1-2 Views  Result Date: 06/20/2021 Tiny sclerotic areas of AVN without collapse  CT CHEST ABDOMEN PELVIS WO CONTRAST  Result Date: 05/29/2021 CLINICAL DATA:  Left renal cell carcinoma status post nephrectomy April 2021. * Tracking Code: BO * EXAM: CT CHEST, ABDOMEN AND PELVIS WITHOUT CONTRAST TECHNIQUE: Multidetector CT imaging of the chest, abdomen and pelvis was performed following the standard protocol without IV contrast. RADIATION DOSE REDUCTION: This exam was performed according to the departmental dose-optimization program which includes automated exposure control, adjustment of the mA and/or kV according to patient size and/or use of iterative reconstruction technique. COMPARISON:  CT abdomen pelvis 03/06/2021 and CT chest 11/28/2020. FINDINGS: CT CHEST FINDINGS Cardiovascular: Atherosclerotic calcification the aortic valve and coronary arteries. Heart size normal. No pericardial effusion. Mediastinum/Nodes: No pathologically enlarged mediastinal or axillary lymph nodes. Hilar regions are difficult to definitively evaluate without IV contrast. Esophagus is  grossly unremarkable. Lungs/Pleura: 5 mm nodule along the minor fissure (4/77), unchanged. No new pulmonary nodules. Mild cylindrical bronchiectasis. No pleural fluid. Airway is unremarkable. Musculoskeletal: No worrisome lytic or sclerotic lesions. CT ABDOMEN PELVIS FINDINGS Hepatobiliary:  Liver and gallbladder are unremarkable. No biliary ductal dilatation. Pancreas: Negative. Spleen: Negative. Adrenals/Urinary Tract: Adrenal glands and right kidney are unremarkable. Right ureter is decompressed. Left nephrectomy. Bland surgical bed. Bladder wall thickening. Stomach/Bowel: Stomach, small bowel, appendix and colon are unremarkable. Favor focal peristalsis in the mid descending colon (2/56) as finding not present on 03/06/2021. Vascular/Lymphatic: Vascular structures are unremarkable. No pathologically enlarged lymph nodes. Reproductive: Prostate is minimally prominent. There may be small bilateral scrotal hydroceles, left greater than right. Other: Small bilateral inguinal hernias contain fat. No free fluid. Mesenteries and peritoneum are otherwise unremarkable. Musculoskeletal: No worrisome lytic or sclerotic lesions. Mild changes of avascular necrosis in the femoral heads. IMPRESSION: 1. No evidence of recurrent or metastatic disease. 2. Prostate appears minimally prominent. Bladder wall thickening may be due to an element of outlet obstruction. 3. Mild changes of avascular necrosis in the femoral heads. 4. Aortic atherosclerosis (ICD10-I70.0). Coronary artery calcification. Electronically Signed   By: Lorin Picket M.D.   On: 05/29/2021 08:51

## 2021-07-24 NOTE — Assessment & Plan Note (Addendum)
Follow up with primary care provider 

## 2021-08-05 ENCOUNTER — Other Ambulatory Visit: Payer: Self-pay

## 2021-08-14 ENCOUNTER — Inpatient Hospital Stay: Payer: Self-pay

## 2021-08-14 ENCOUNTER — Other Ambulatory Visit: Payer: Self-pay

## 2021-08-14 ENCOUNTER — Inpatient Hospital Stay: Payer: Self-pay | Attending: Oncology

## 2021-08-14 ENCOUNTER — Encounter: Payer: Self-pay | Admitting: Oncology

## 2021-08-14 ENCOUNTER — Inpatient Hospital Stay (HOSPITAL_BASED_OUTPATIENT_CLINIC_OR_DEPARTMENT_OTHER): Payer: Self-pay | Admitting: Oncology

## 2021-08-14 DIAGNOSIS — C642 Malignant neoplasm of left kidney, except renal pelvis: Secondary | ICD-10-CM

## 2021-08-14 DIAGNOSIS — I2510 Atherosclerotic heart disease of native coronary artery without angina pectoris: Secondary | ICD-10-CM | POA: Insufficient documentation

## 2021-08-14 DIAGNOSIS — Z79899 Other long term (current) drug therapy: Secondary | ICD-10-CM | POA: Insufficient documentation

## 2021-08-14 DIAGNOSIS — G969 Disorder of central nervous system, unspecified: Secondary | ICD-10-CM

## 2021-08-14 DIAGNOSIS — Z7902 Long term (current) use of antithrombotics/antiplatelets: Secondary | ICD-10-CM | POA: Insufficient documentation

## 2021-08-14 DIAGNOSIS — Z5111 Encounter for antineoplastic chemotherapy: Secondary | ICD-10-CM

## 2021-08-14 DIAGNOSIS — N1832 Chronic kidney disease, stage 3b: Secondary | ICD-10-CM | POA: Insufficient documentation

## 2021-08-14 DIAGNOSIS — Z905 Acquired absence of kidney: Secondary | ICD-10-CM | POA: Insufficient documentation

## 2021-08-14 DIAGNOSIS — I130 Hypertensive heart and chronic kidney disease with heart failure and stage 1 through stage 4 chronic kidney disease, or unspecified chronic kidney disease: Secondary | ICD-10-CM | POA: Insufficient documentation

## 2021-08-14 DIAGNOSIS — M879 Osteonecrosis, unspecified: Secondary | ICD-10-CM | POA: Insufficient documentation

## 2021-08-14 DIAGNOSIS — Z5112 Encounter for antineoplastic immunotherapy: Secondary | ICD-10-CM | POA: Insufficient documentation

## 2021-08-14 DIAGNOSIS — Z955 Presence of coronary angioplasty implant and graft: Secondary | ICD-10-CM | POA: Insufficient documentation

## 2021-08-14 DIAGNOSIS — Z7982 Long term (current) use of aspirin: Secondary | ICD-10-CM | POA: Insufficient documentation

## 2021-08-14 LAB — CBC WITH DIFFERENTIAL/PLATELET
Abs Immature Granulocytes: 0.02 10*3/uL (ref 0.00–0.07)
Basophils Absolute: 0.1 10*3/uL (ref 0.0–0.1)
Basophils Relative: 1 %
Eosinophils Absolute: 0.4 10*3/uL (ref 0.0–0.5)
Eosinophils Relative: 5 %
HCT: 38.4 % — ABNORMAL LOW (ref 39.0–52.0)
Hemoglobin: 11.9 g/dL — ABNORMAL LOW (ref 13.0–17.0)
Immature Granulocytes: 0 %
Lymphocytes Relative: 21 %
Lymphs Abs: 1.7 10*3/uL (ref 0.7–4.0)
MCH: 25 pg — ABNORMAL LOW (ref 26.0–34.0)
MCHC: 31 g/dL (ref 30.0–36.0)
MCV: 80.7 fL (ref 80.0–100.0)
Monocytes Absolute: 0.7 10*3/uL (ref 0.1–1.0)
Monocytes Relative: 9 %
Neutro Abs: 5.3 10*3/uL (ref 1.7–7.7)
Neutrophils Relative %: 64 %
Platelets: 156 10*3/uL (ref 150–400)
RBC: 4.76 MIL/uL (ref 4.22–5.81)
RDW: 15.9 % — ABNORMAL HIGH (ref 11.5–15.5)
WBC: 8.1 10*3/uL (ref 4.0–10.5)
nRBC: 0 % (ref 0.0–0.2)

## 2021-08-14 LAB — COMPREHENSIVE METABOLIC PANEL
ALT: 23 U/L (ref 0–44)
AST: 31 U/L (ref 15–41)
Albumin: 3.9 g/dL (ref 3.5–5.0)
Alkaline Phosphatase: 94 U/L (ref 38–126)
Anion gap: 6 (ref 5–15)
BUN: 39 mg/dL — ABNORMAL HIGH (ref 6–20)
CO2: 23 mmol/L (ref 22–32)
Calcium: 9 mg/dL (ref 8.9–10.3)
Chloride: 108 mmol/L (ref 98–111)
Creatinine, Ser: 2.22 mg/dL — ABNORMAL HIGH (ref 0.61–1.24)
GFR, Estimated: 33 mL/min — ABNORMAL LOW (ref >60)
Glucose, Bld: 178 mg/dL — ABNORMAL HIGH (ref 70–99)
Potassium: 4.9 mmol/L (ref 3.5–5.1)
Sodium: 137 mmol/L (ref 135–145)
Total Bilirubin: 0.7 mg/dL (ref 0.3–1.2)
Total Protein: 6.8 g/dL (ref 6.5–8.1)

## 2021-08-14 MED ORDER — SODIUM CHLORIDE 0.9 % IV SOLN
Freq: Once | INTRAVENOUS | Status: AC
  Filled 2021-08-14: qty 250

## 2021-08-14 MED ORDER — SODIUM CHLORIDE 0.9 % IV SOLN
200.0000 mg | Freq: Once | INTRAVENOUS | Status: AC
  Administered 2021-08-14: 200 mg via INTRAVENOUS
  Filled 2021-08-14: qty 8

## 2021-08-14 NOTE — Progress Notes (Addendum)
Hematology/Oncology Progress note [phone] [phone]    Patient Care Team: Center, [facility] as PCP - General (General Practice) Earlie Server, MD as Consulting Physician (Oncology)   Name of the patient: Mark Donaldson  [id number]  1960-12-30  Date of visit: 08/14/21  ASSESSMENT & PLAN:   Cancer Staging  Renal cell carcinoma of left kidney [facility]) Staging form: Kidney, AJCC 8th Edition - Pathologic: Stage IV (pT3a, pNX, cM1) - Signed by Earlie Server, MD on 05/23/2019   Renal cell carcinoma of left kidney ([place]) Stage IV renal cell carcinoma, Previously did not tolerate axitinib. Labs reviewed and discussed with patient Proceed with Keytruda.     Encounter for antineoplastic chemotherapy Chemotherapy plan as listed above.   Stage 3b chronic kidney disease ([place]) avoid nephrotoxins. Encourage oral hydration  CNS lesion neurocysticercosis finished therapy. Repeat MRI brain No orders of the defined types were placed in this encounter.     Follow up lab MD 3 weeks Keytruda, obtain CT chest abdomen pelvis in mid August 2023. All questions were answered. The patient knows to call the clinic with any problems, questions or concerns.  Earlie Server, MD, PhD [facility] Hematology Oncology 08/14/2021   PERTINENT HISTORY-  61 y.o. male presents for follow-up of renal cell carcinoma treatments. Oncology History  Renal cell carcinoma of left kidney ([place])  05/23/2019 Initial Diagnosis   Renal cell carcinoma of left kidney   -05/02/2019-05/07/2019 due to gross hematuria. CT showed a 7.4 cm left lower pole renal mass concerning for RCC.  Patient also has necrotic lymphadenopathy in the mediastinum and the right supra hilar region which were highly suspicious for metastatic disease.  Bilateral lung nodules, also concerning for metastatic lung disease.  Right retrocrural lymphadenopathy with upper normal left periaortic lymph node. Patient was  recommended for cytoreductive radical nephrectomy.  With history of ANCA positive vasculitis, also recommend thoracic lymphadenopathy biopsy to confirm distal metastasis.  Patient agreed to radical nephrectomy and declined bronchoscopy biopsy. He underwent left radical nephrectomy on 05/05/2019. Pathology showed pT3a pNx, RCC, conventional clear cell type, grade 3    05/23/2019 Cancer Staging   Staging form: Kidney, AJCC 8th Edition - Pathologic: Stage IV (pT3a, pNX, cM1) - Signed by Earlie Server, MD on 05/23/2019   06/07/2019 -  Chemotherapy   Keytruda + Axitinib.  06/07/19 He started Axitinib '5mg'$  BID, stopped on 07/25/2020 due to AKI, resumed on 10/04/2019, stopped again on 05/01/2020 due to diarrhea. 09/11/2020 resumed axitinib 5 mg twice daily, stopped on 10/23/2020 due to diarrhea.  01/17/2021 Keytruda was held due to transaminitis, immunotherapy induced liver toxicities. 02/20/2021, resumed Keytruda. Axitinib continues to be held.   01/12/2020 Imaging   CT showed overall marked improvement.  Comparing to CT scan in August 2021, continues to have treatment response.Mediastinal soft tissue nodule nearly completely resolved, right minor fissure nodule 8 mm, decreased in size.  Resolution of pleural fluid and thickening of the right chest.  Small pleural effusion in the left chest.  No new lung nodules.-Subtle variation of the muscle architecture of the left psoas muscle of unknown significance   04/30/2020 Imaging   CT chest abdomen pelvis without contrast showed stable disease   11/28/2020 Imaging   CT abdomen pelvis without contrast showed stable examination.  No evidence of new or progressive disease in the chest abdomen pelvis.  Stable to minimally decreased size of 5 mm pulmonary nodule in the minor fissure in the right chest.  Sigmoid colon diverticulosis without findings of  acute diverticulitis.   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  Patient has been referred to orthopedic surgeon in [place] which he is not able to go due to transportation.  He does not have insurance as well.  He is currently asymptomatic.  Monitor.  Patient has past medical history CAD status post PCI to LAD in 2018, on aspirin and Plavix, CHF, history of focal sclerosing/crescentic GN-ANCA positive and CKD stage III. 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 He finished 14 days of albendazole plus praziquantel .   He has had few side effects including poor appetite, hair loss, weight loss, bloody stool, abdominal cramping or diarrhea. All symptoms have improved since he completed the treatment. 03/16/2020 he finished tapering course of steroids  #Intermittent orthopnea,Pre-existing diastolic CHF, 1/49/7026 LVEF 60 to 65%.   Recommend him to follow-up with cardiology-he is has not able to follow-up with cardiology due to lack of insurance coverage.  Patient is Spanish-speaking.   Spanish interpreter presented the entire encounter for translation Patient reports feeling well today.  No new complaints.  Denies nausea vomiting diarrhea He has frequent urination at night.  Previously tried Cardura for BPH and had to stop due hypotension.  Review of systems- Review of Systems  Constitutional:  Negative for appetite change, chills, fatigue, fever and unexpected weight change.  HENT:   Negative for voice change.   Eyes:  Negative for eye problems and icterus.  Respiratory:  Negative for chest tightness and cough.   Cardiovascular:  Negative for chest pain and leg swelling.  Gastrointestinal:  Negative for abdominal distention, abdominal pain and diarrhea.  Endocrine: Negative for hot flashes.  Genitourinary:  Positive for nocturia. Negative for difficulty urinating, dysuria and frequency.   Musculoskeletal:  Positive for arthralgias.  Skin:  Negative  for itching and rash.  Neurological:  Negative for light-headedness and numbness.  Hematological:  Negative for adenopathy. Does not bruise/bleed easily.  Psychiatric/Behavioral:  Negative for confusion.     No Known Allergies  Patient Active Problem List   Diagnosis Date Noted   Renal cell carcinoma of left kidney ([place]) 05/23/2019    Priority: High   Stage 3b chronic kidney disease ([place]) 10/04/2019    Priority: Medium    Encounter for antineoplastic chemotherapy 07/19/2019    Priority: Medium    Goals of care, counseling/discussion 05/23/2019    Priority: Low   Avascular necrosis of bones of both hips ([place]) 06/20/2021   Transaminitis 01/17/2021   History of CHF (congestive heart failure) 05/01/2020   Hyperglycemia 02/28/2020   Brain lesion 10/04/2019   Encounter for antineoplastic immunotherapy 07/19/2019   CNS lesion 07/07/2019   Anemia in stage 3b chronic kidney disease ([place]) 06/07/2019   Renal cell carcinoma ([place]) 05/23/2019   Palliative care encounter    History of vasculitis    Thoracic lymphadenopathy    Lung nodule    CAD S/P percutaneous coronary angioplasty 05/02/2019   Kidney mass 05/02/2019   Hydronephrosis of left kidney 05/02/2019   UTI (urinary tract infection) 05/02/2019   AKI (acute kidney injury) ([place]) 05/02/2019   Gross hematuria 05/02/2019   Chronic diastolic CHF (congestive heart failure) ([place]) 05/02/2019   Sepsis ([place]) 05/02/2019   Severe sepsis ([place]) 05/02/2019   Preop cardiovascular exam    Hyperlipidemia 05/21/2016   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 ([place]) 05/21/2016   Prediabetes 05/21/2016   Alcohol abuse 05/18/2016   Essential hypertension 05/18/2016   Vasculitis, 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[place]) 05/18/2016   Shortness of breath 05/17/2016     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HCC)    CAD (coronary artery disease)    Essential hypertension    HFrEF (heart failure with reduced ejection fraction)  (HCC)    Hyperlipidemia LDL goal <70    Ischemic cardiomyopathy    Renal cell carcinoma ([place]) 05/23/2019   Renal disorder      Past Surgical History:  Procedure Laterality Date   CARDIAC SURGERY     cardiac cath with 2 stent placement   LAPAROSCOPIC NEPHRECTOMY, HAND ASSISTED Left 05/05/2019   Procedure: HAND ASSISTED LAPAROSCOPIC NEPHRECTOMY;  Surgeon: Hollice Espy, MD;  Location: [facility] ORS;  Service: Urology;  Laterality: Left;    Social History   Socioeconomic History   Marital status: Married    Spouse name: Not on file   Number of children: Not on file   Years of education: Not on file   Highest education level: Not on file  Occupational History   Not on file  Tobacco Use   Smoking status: Never   Smokeless tobacco: Never  Substance and Sexual Activity   Alcohol use: Yes   Drug use: Not on file   Sexual activity: Not on file  Other Topics Concern   Not on file  Social History Narrative   Not on file   Social Determinants of Health   Financial Resource Strain: Not on file  Food Insecurity: Not on file  Transportation Needs: Not on file  Physical Activity: Not on file  Stress: Not on file  Social Connections: Not on file  Intimate Partner Violence: Not on file     Family History  Problem Relation Age of Onset   Cancer Mother    Blindness Brother      Current Outpatient Medications:    aspirin 81 MG EC tablet, Take 81 mg by mouth daily. Swallow whole., Disp: , Rfl:    atorvastatin (LIPITOR) 80 MG tablet, Take 80 mg by mouth daily., Disp: , Rfl:    clopidogrel (PLAVIX) 75 MG tablet, Take 75 mg by mouth daily., Disp: , Rfl:    furosemide (LASIX) 20 MG tablet, Take 1 tablet (20 mg total) by mouth daily., Disp: 30 tablet, Rfl: 3   KEYTRUDA 100 MG/4ML SOLN, INJECT '200MG'$  INTRAVENOUSLY EVERY 3 WEEKS, Disp: 8 mL, Rfl: 11   lisinopril (ZESTRIL) 10 MG tablet, Take 10 mg by mouth daily., Disp: , Rfl:    loperamide (IMODIUM) 2 MG capsule, Take 1 capsule (2 mg  total) by mouth See admin instructions. Take 2 tablets with onset of diarrhea, then 1 tablet for every 2 hours until diarrhea stops. Maximum 8 tablets per 24 hours. (Patient not taking: Reported on 07/24/2021), Disp: 60 capsule, Rfl: 2   metoprolol succinate (TOPROL-XL) 100 MG 24 hr tablet, Take 1.5 tablets by mouth daily., Disp: , Rfl:    Physical exam:  Vitals:   08/14/21 1307  BP: 120/86  Pulse: 71  Resp: 18  Temp: 98.2 F (36.8 C)  Weight: 162 lb 9.6 oz (73.8 kg)    Physical Exam Constitutional:      General: He is not in acute distress.    Appearance: He is not diaphoretic.  HENT:     Head: Normocephalic and atraumatic.     Nose: Nose normal.  Mouth/Throat:     Pharynx: No oropharyngeal exudate.  Eyes:     General: No scleral icterus.    Pupils: Pupils are equal, round, and reactive to light.  Cardiovascular:     Rate and Rhythm: Normal rate and regular rhythm.     Heart sounds: No murmur heard. Pulmonary:     Effort: Pulmonary effort is normal. No respiratory distress.     Breath sounds: No rales.  Chest:     Chest wall: No tenderness.  Abdominal:     General: There is no distension.     Palpations: Abdomen is soft.     Tenderness: There is no abdominal tenderness.  Musculoskeletal:        General: Normal range of motion.     Cervical back: Normal range of motion and neck supple.  Skin:    General: Skin is warm and dry.     Findings: No erythema.  Neurological:     Mental Status: He is alert and oriented to person, place, and time.     Cranial Nerves: No cranial nerve deficit.     Motor: No abnormal muscle tone.     Coordination: Coordination normal.  Psychiatric:        Mood and Affect: Affect normal.           Latest Ref Rng & Units 08/14/2021   12:54 PM  CMP  Glucose 70 - 99 mg/dL 178   BUN 6 - 20 mg/dL 39   Creatinine 0.61 - 1.24 mg/dL 2.22   Sodium 135 - 145 mmol/L 137   Potassium 3.5 - 5.1 mmol/L 4.9   Chloride 98 - 111 mmol/L 108   CO2  22 - 32 mmol/L 23   Calcium 8.9 - 10.3 mg/dL 9.0   Total Protein 6.5 - 8.1 g/dL 6.8   Total Bilirubin 0.3 - 1.2 mg/dL 0.7   Alkaline Phos 38 - 126 U/L 94   AST 15 - 41 U/L 31   ALT 0 - 44 U/L 23       Latest Ref Rng & Units 08/14/2021   12:54 PM  CBC  WBC 4.0 - 10.5 K/uL 8.1   Hemoglobin 13.0 - 17.0 g/dL 11.9   Hematocrit 39.0 - 52.0 % 38.4   Platelets 150 - 400 K/uL 156     RADIOGRAPHIC STUDIES: I have personally reviewed the radiological images as listed and agreed with the findings in the report. XR Pelvis 1-2 Views  Result Date: 06/20/2021 Tiny sclerotic areas of AVN without collapse  CT CHEST ABDOMEN PELVIS WO CONTRAST  Result Date: 05/29/2021 CLINICAL DATA:  Left renal cell carcinoma status post nephrectomy April 2021. * Tracking Code: BO * EXAM: CT CHEST, ABDOMEN AND PELVIS WITHOUT CONTRAST TECHNIQUE: Multidetector CT imaging of the chest, abdomen and pelvis was performed following the standard protocol without IV contrast. RADIATION DOSE REDUCTION: This exam was performed according to the departmental dose-optimization program which includes automated exposure control, adjustment of the mA and/or kV according to patient size and/or use of iterative reconstruction technique. COMPARISON:  CT abdomen pelvis 03/06/2021 and CT chest 11/28/2020. FINDINGS: CT CHEST FINDINGS Cardiovascular: Atherosclerotic calcification the aortic valve and coronary arteries. Heart size normal. No pericardial effusion. Mediastinum/Nodes: No pathologically enlarged mediastinal or axillary lymph nodes. Hilar regions are difficult to definitively evaluate without IV contrast. Esophagus is grossly unremarkable. Lungs/Pleura: 5 mm nodule along the minor fissure (4/77), unchanged. No new pulmonary nodules. Mild cylindrical bronchiectasis. No pleural fluid. Airway is unremarkable. Musculoskeletal: No worrisome  lytic or sclerotic lesions. CT ABDOMEN PELVIS FINDINGS Hepatobiliary: Liver and gallbladder are  unremarkable. No biliary ductal dilatation. Pancreas: Negative. Spleen: Negative. Adrenals/Urinary Tract: Adrenal glands and right kidney are unremarkable. Right ureter is decompressed. Left nephrectomy. Bland surgical bed. Bladder wall thickening. Stomach/Bowel: Stomach, small bowel, appendix and colon are unremarkable. Favor focal peristalsis in the mid descending colon (2/56) as finding not present on 03/06/2021. Vascular/Lymphatic: Vascular structures are unremarkable. No pathologically enlarged lymph nodes. Reproductive: Prostate is minimally prominent. There may be small bilateral scrotal hydroceles, left greater than right. Other: Small bilateral inguinal hernias contain fat. No free fluid. Mesenteries and peritoneum are otherwise unremarkable. Musculoskeletal: No worrisome lytic or sclerotic lesions. Mild changes of avascular necrosis in the femoral heads. IMPRESSION: 1. No evidence of recurrent or metastatic disease. 2. Prostate appears minimally prominent. Bladder wall thickening may be due to an element of outlet obstruction. 3. Mild changes of avascular necrosis in the femoral heads. 4. Aortic atherosclerosis (ICD10-I70.0). Coronary artery calcification. Electronically Signed   By: Lorin Picket M.D.   On: 05/29/2021 08:51

## 2021-08-14 NOTE — Assessment & Plan Note (Signed)
avoid nephrotoxins. Encourage oral hydration 

## 2021-08-14 NOTE — Assessment & Plan Note (Signed)
neurocysticercosis finished therapy. Repeat MRI brain

## 2021-08-14 NOTE — Assessment & Plan Note (Signed)
Stage IV renal cell carcinoma, Previously did not tolerate axitinib. Labs reviewed and discussed with patient Proceed with Keytruda.

## 2021-08-14 NOTE — Patient Instructions (Signed)
[facility] AT [place]   Discharge Instructions: Thank you for choosing [facility] to provide your oncology and hematology care.  If you have a lab appointment with the [facility], please go directly to the [facility] and check in at the registration area.  Wear comfortable clothing and clothing appropriate for easy access to any Portacath or PICC line.   We strive to give you quality time with your provider. You may need to reschedule your appointment if you arrive late (15 or more minutes).  Arriving late affects you and other patients whose appointments are after yours.  Also, if you miss three or more appointments without notifying the office, you may be dismissed from the clinic at the provider's discretion.      For prescription refill requests, have your pharmacy contact our office and allow 72 hours for refills to be completed.    Today you received the following chemotherapy and/or immunotherapy agents Beryle Flock       To help prevent nausea and vomiting after your treatment, we encourage you to take your nausea medication as directed.  BELOW ARE SYMPTOMS THAT SHOULD BE REPORTED IMMEDIATELY: *FEVER GREATER THAN 100.4 F (38 C) OR HIGHER *CHILLS OR SWEATING *NAUSEA AND VOMITING THAT IS NOT CONTROLLED WITH YOUR NAUSEA MEDICATION *UNUSUAL SHORTNESS OF BREATH *UNUSUAL BRUISING OR BLEEDING *URINARY PROBLEMS (pain or burning when urinating, or frequent urination) *BOWEL PROBLEMS (unusual diarrhea, constipation, pain near the anus) TENDERNESS IN MOUTH AND THROAT WITH OR WITHOUT PRESENCE OF ULCERS (sore throat, sores in mouth, or a toothache) UNUSUAL RASH, SWELLING OR PAIN  UNUSUAL VAGINAL DISCHARGE OR ITCHING   Items with * indicate a potential emergency and should be followed up as soon as possible or go to the Emergency Department if any problems should occur.  Please show the CHEMOTHERAPY ALERT CARD or IMMUNOTHERAPY ALERT CARD at check-in  to the Emergency Department and triage nurse.  Should you have questions after your visit or need to cancel or reschedule your appointment, please contact [facility] [place] [facility] [place]  [phone] and follow the prompts.  Office hours are 8:00 a.m. to 4:30 p.m. Monday - Friday. Please note that voicemails left after 4:00 p.m. may not be returned until the following business day.  We are closed weekends and major holidays. You have access to a nurse at all times for urgent questions. Please call the main number to the clinic [phone] and follow the prompts.  For any non-urgent questions, you may also contact your provider using MyChart. We now offer e-Visits for anyone 57 and older to request care online for non-urgent symptoms. For details visit [email or web address].   Also download the MyChart app! Go to the app store, search "MyChart", open the app, select [facility], and log in with your MyChart username and password.  Masks are optional in the cancer centers. If you would like for your care team to wear a mask while they are taking care of you, please let them know. For doctor visits, patients may have with them one support person who is at least 60 years old. At this time, visitors are not allowed in the infusion area. Pembrolizumab injection Qu es este medicamento? El PEMBROLIZUMAB es un anticuerpo monoclonal. [place] para tratar ciertos tipos de cncer. Este medicamento puede ser utilizado para otros usos; si tiene alguna pregunta consulte con su proveedor de atencin mdica o con su farmacutico. MARCAS COMUNES: Keytruda Qu le debo informar a mi profesional de la  salud antes de tomar este medicamento? Necesitan saber si usted presenta alguno de los siguientes problemas o situaciones: enfermedades autoinmunes tales como enfermedad de Crohn, colitis ulcerativa o lupus ha tenido o planea tener un trasplante alognico de Social research officer, government ([place] las clulas madre de Theatre manager  persona) antecedentes de trasplante de rganos antecedentes de radiacin en el pecho problemas del sistema nervioso, tales como miastenia grave o sndrome de Curator una reaccin alrgica o inusual al pembrolizumab, a otros medicamentos, alimentos, colorantes o conservantes si est embarazada o buscando quedar embarazada si est amamantando a un beb Cmo debo utilizar este medicamento? Este medicamento se administra mediante infusin en una vena. Lo administra un profesional de Technical sales engineer en un hospital o en un entorno clnico. Se le entregar una Gua del medicamento (MedGuide, su nombre en ingls) especial antes de cada tratamiento. Asegrese de leer esta informacin cada vez cuidadosamente. Hable con su pediatra para informarse acerca del uso de este medicamento en nios. Aunque este medicamento se puede recetar a nios tan pequeos como de 6 meses de edad con ciertas afecciones, existen precauciones que deben tomarse. Sobredosis: Pngase en contacto inmediatamente con un centro toxicolgico o una sala de urgencia si usted cree que haya tomado demasiado medicamento. ATENCIN: [employer] es solo para usted. No comparta este medicamento con nadie. Qu sucede si me olvido de una dosis? Es importante no olvidar ninguna dosis. Informe a su mdico o a su profesional de la salud si no puede asistir a Photographer. Qu puede interactuar con este medicamento? No se han estudiado las interacciones. Puede ser que esta lista no menciona todas las posibles interacciones. Informe a su profesional de [employer]	[place] de [employer] productos a base de hierbas, medicamentos de [place] o suplementos nutritivos que est tomando. Si usted fuma, consume bebidas alcohlicas o si utiliza drogas ilegales, indqueselo tambin a su profesional de [employer]	[place]. Algunas sustancias pueden interactuar con su medicamento. A qu debo estar atento al usar [employer]? Se supervisar su estado de salud atentamente  mientras reciba este medicamento. Usted podra necesitar realizarse [employer] de sangre mientras est usando [place]. No debe quedar embarazada mientras est usando este medicamento o por 4 meses despus de dejar de usarlo. Las mujeres deben informar a su mdico si estn buscando quedar embarazadas o si creen que podran estar embarazadas. Existe la posibilidad de efectos secundarios graves en un beb sin nacer. Para obtener ms informacin, hable con su profesional de la salud o su farmacutico. No debe amamantar a un beb mientras est usando este medicamento o durante 4 meses despus de la ltima dosis. Qu efectos secundarios puedo tener al [employer] este medicamento? Efectos secundarios que debe informar a su mdico o a Barrister's clerk de la salud tan pronto como sea posible: Chief of Staff, tales como erupcin cutnea, comezn/picazn o urticaria, e hinchazn de la cara, los labios o la lengua con sangre o de color negro y aspecto alquitranado problemas para respirar cambios en la visin dolor en el pecho escalofros confusin estreimiento tos diarrea mareo, sensacin de desmayo o aturdimiento frecuencia cardiaca rpida o irregular fiebre enrojecimiento dolor en las articulaciones recuentos sanguneos bajos: este medicamento podra reducir la cantidad de glbulos blancos, glbulos rojos y plaquetas. Su riesgo de infeccin y sangrado podra ser mayor. dolor muscular debilidad muscular dolor, hormigueo o entumecimiento de las manos o los pies dolor de cabeza persistente enrojecimiento, formacin de ampollas, descamacin o distensin de la piel, incluso dentro de la boca signos y sntomas de [place]  elevados de azcar en la sangre, tales como mareo; boca seca; piel seca; aliento frutal; nuseas; dolor de estmago; aumento del apetito o la sed; aumento de la frecuencia urinaria signos y sntomas de lesin renal, tales como dificultad para Garment/textile technologist o cambios en la cantidad de  orina signos y sntomas de lesin en el hgado, como orina oscura, heces claras, prdida del apetito, nuseas, dolor en la regin abdominal superior derecha, color amarillento de los ojos o la piel sudoracin ganglios linfticos inflamados prdida de peso Efectos secundarios que generalmente no requieren atencin mdica (infrmelos a su mdico o a su profesional de la salud si persisten o si son molestos): disminucin del apetito cada del cabello cansancio Puede ser que esta lista no menciona todos los posibles efectos secundarios. Comunquese a su mdico por asesoramiento mdico [employer]. Usted puede informar los efectos secundarios a la FDA por telfono al 1-800-FDA-1088. Dnde debo guardar mi medicina? Este medicamento se administra en hospitales o clnicas, y no necesitar guardarlo en su domicilio. ATENCIN: Este folleto es un resumen. Puede ser que no cubra toda la posible informacin. Si usted tiene preguntas acerca de esta medicina, consulte con su mdico, su farmacutico o su profesional de Technical sales engineer.  2023 Elsevier/Gold Standard (2019-05-05 00:00:00)

## 2021-08-14 NOTE — Assessment & Plan Note (Signed)
Chemotherapy plan as listed above 

## 2021-08-14 NOTE — Progress Notes (Signed)
Pt here for follow up. No new concerns voiced.   

## 2021-08-15 ENCOUNTER — Other Ambulatory Visit: Payer: Self-pay

## 2021-08-15 ENCOUNTER — Telehealth: Payer: Self-pay

## 2021-08-15 DIAGNOSIS — C642 Malignant neoplasm of left kidney, except renal pelvis: Secondary | ICD-10-CM

## 2021-08-15 NOTE — Telephone Encounter (Signed)
Please schedule patient for MRI brain wwo- next available. I will contact pt with appt once scheduled.

## 2021-08-15 NOTE — Telephone Encounter (Signed)
Attempt made to call pt x2. No answer. Unable to leave VM

## 2021-08-15 NOTE — Telephone Encounter (Signed)
-----   Message from Earlie Server, MD sent at 08/14/2021  8:18 PM EDT ----- I forgot to mention to him that he needs a repeat of the MRI of the brain to follow-up on the brain lesion.. Please schedule MRI brain with and without contrast thank you

## 2021-08-20 ENCOUNTER — Encounter: Payer: Self-pay | Admitting: Oncology

## 2021-08-20 NOTE — Telephone Encounter (Signed)
Pt informed about MRI appt. Ph number updated in system.

## 2021-08-26 ENCOUNTER — Ambulatory Visit
Admission: RE | Admit: 2021-08-26 | Discharge: 2021-08-26 | Disposition: A | Discharge status: Home / Self Care | Payer: Self-pay | Source: Ambulatory Visit | Attending: Oncology | Admitting: Oncology

## 2021-08-26 DIAGNOSIS — C642 Malignant neoplasm of left kidney, except renal pelvis: Secondary | ICD-10-CM | POA: Insufficient documentation

## 2021-08-26 MED ORDER — GADOBUTROL 1 MMOL/ML IV SOLN
7.0000 mL | Freq: Once | INTRAVENOUS | Status: AC | PRN
  Administered 2021-08-26: 7 mL via INTRAVENOUS

## 2021-08-28 ENCOUNTER — Ambulatory Visit
Admission: RE | Admit: 2021-08-28 | Discharge: 2021-08-28 | Disposition: A | Discharge status: Home / Self Care | Payer: Self-pay | Source: Ambulatory Visit | Attending: Oncology | Admitting: Oncology

## 2021-08-28 DIAGNOSIS — C642 Malignant neoplasm of left kidney, except renal pelvis: Secondary | ICD-10-CM | POA: Insufficient documentation

## 2021-08-31 IMAGING — US US RENAL
1 series · 15 of 20 positions shown · non-contrast
Comparison: Abdominopelvic CT 05/03/2019. Abdominal ultrasound
07/23/2007.

CLINICAL DATA: Acute kidney injury. History of left nephrectomy for
renal cell carcinoma.

EXAM:
RENAL / URINARY TRACT ULTRASOUND COMPLETE

[Series 1: us renal · 15 of 20 slices shown]
[im 1/20]
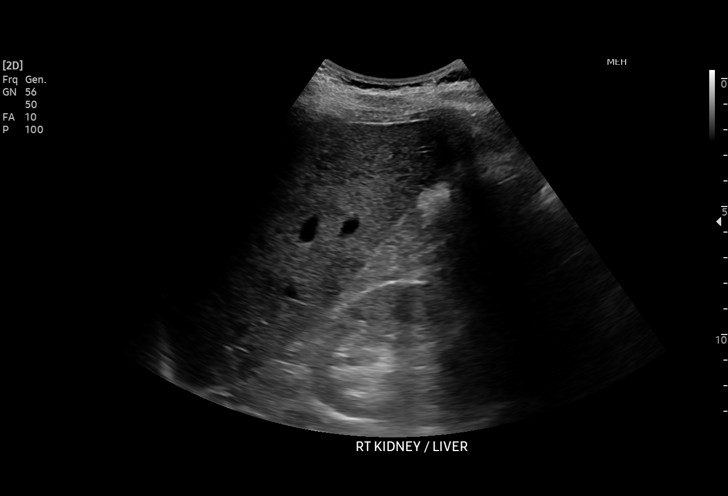
[im 3/20]
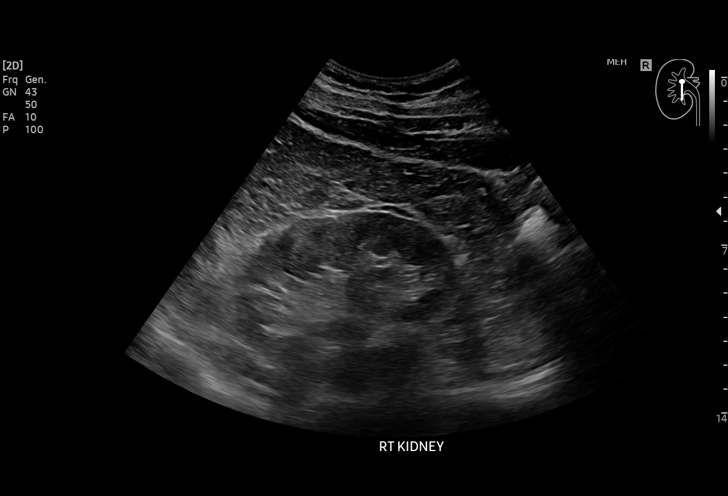
[im 4/20]
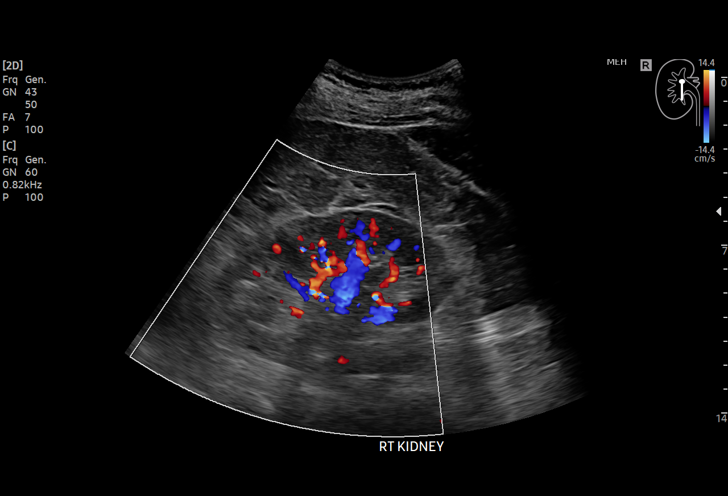
[im 5/20]
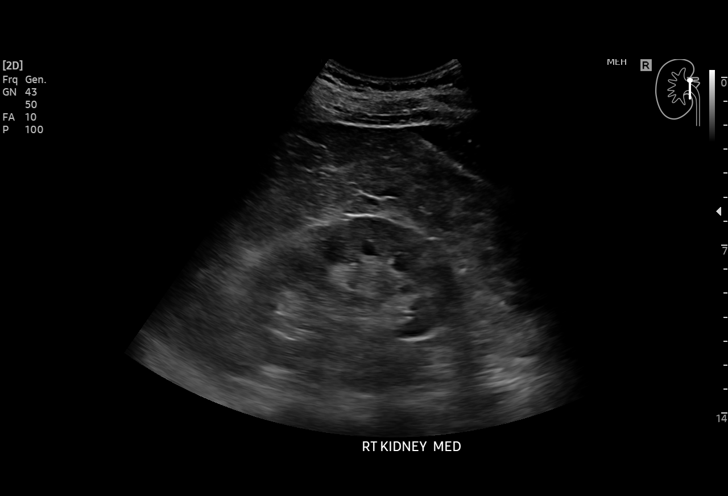
[im 7/20]
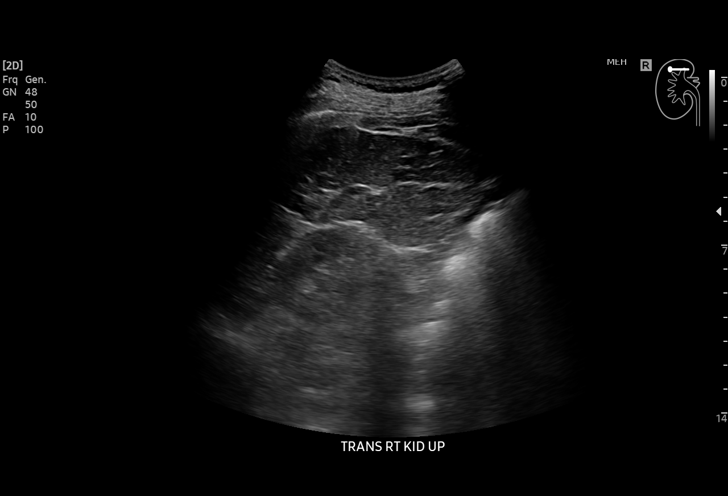
[im 8/20]
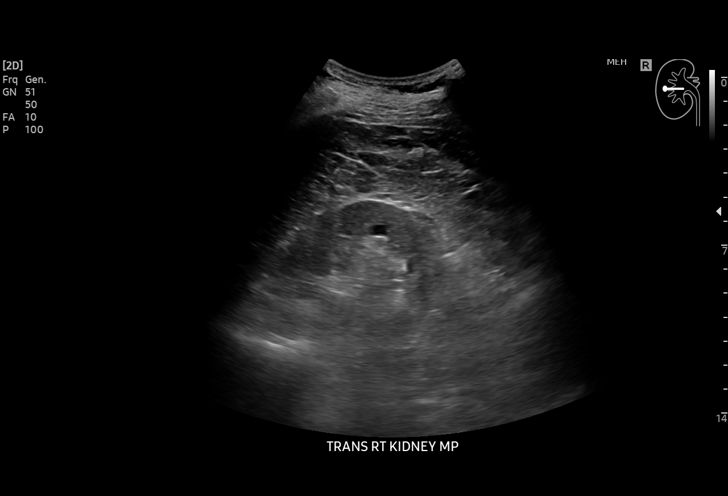
[im 9/20]
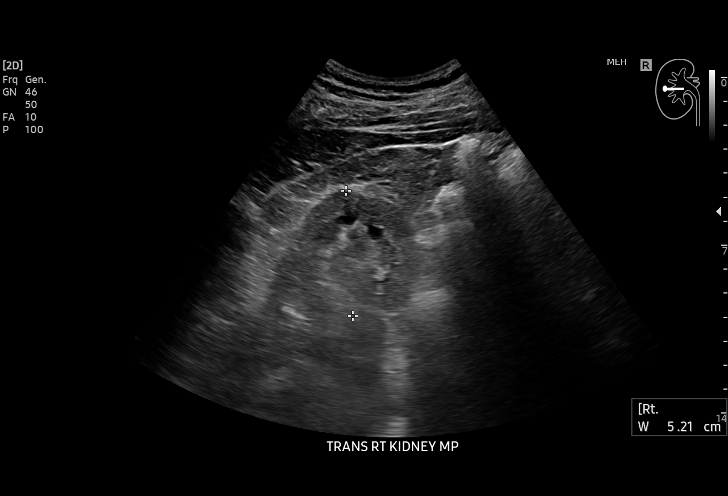
[im 11/20]
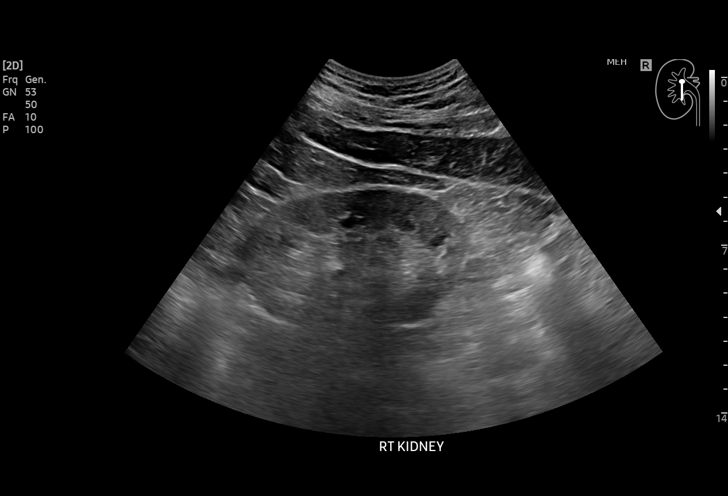
[im 12/20]
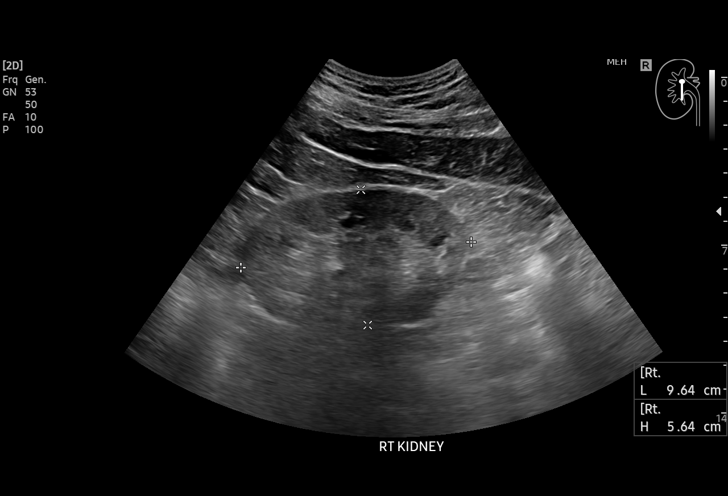
[im 13/20]
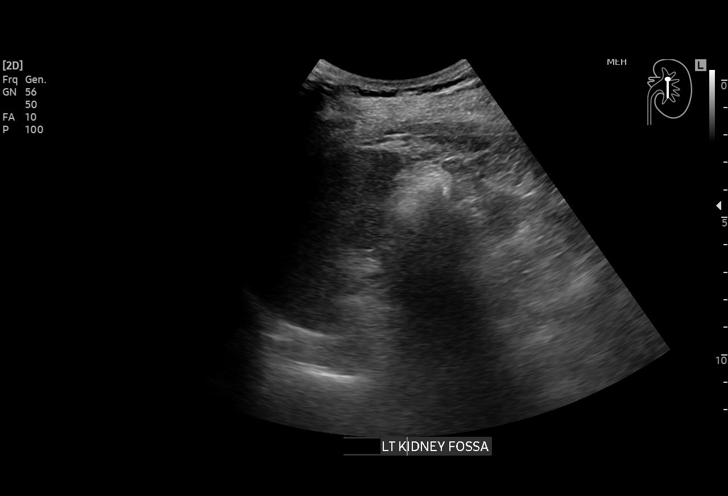
[im 15/20]
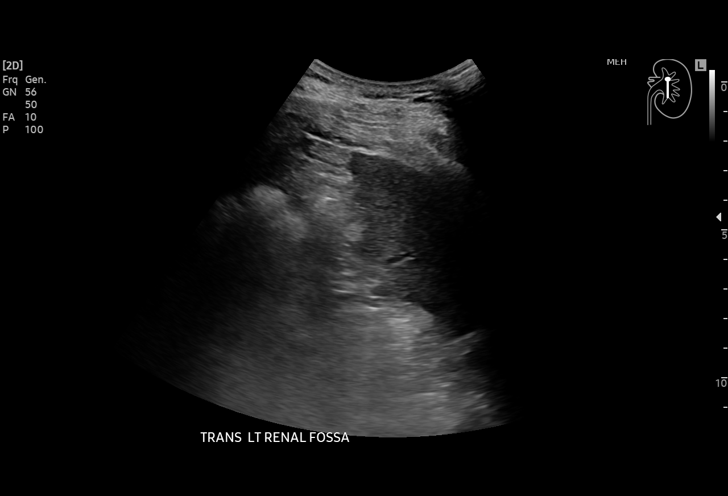
[im 16/20]
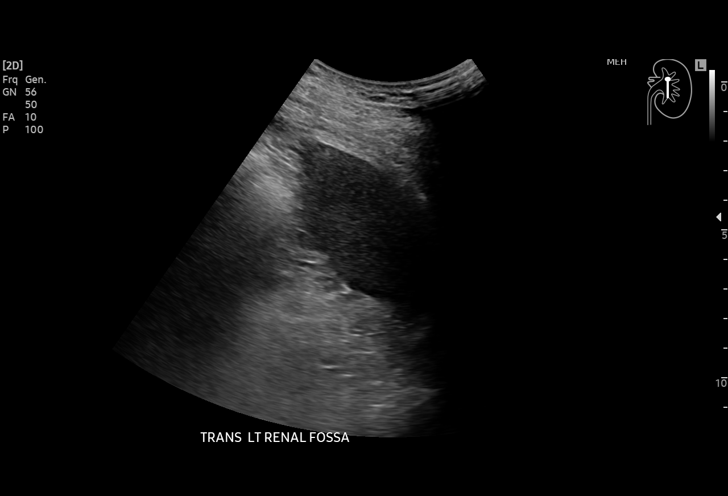
[im 17/20]
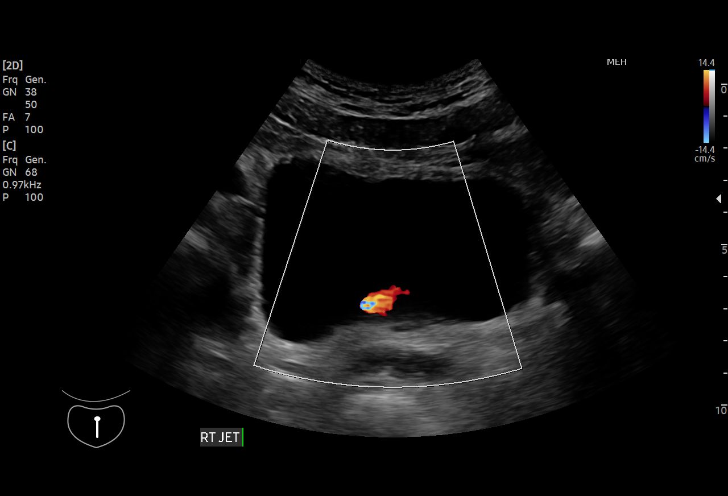
[im 19/20]
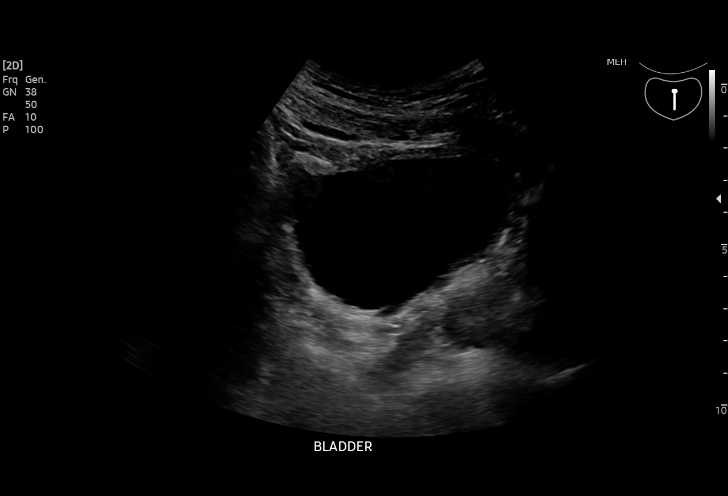
[im 20/20]
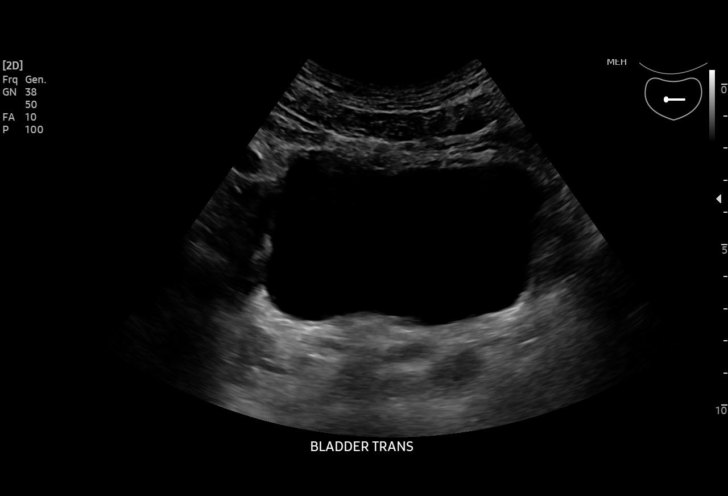

[15 of 20 positions shown; findings below may reference images not displayed]

FINDINGS: Right Kidney:

Renal measurements: 9.6 x 5.6 x 5.2 cm = volume: 148.4 mL. Mildly
increased cortical echogenicity without focal abnormality. No
hydronephrosis.

Left Kidney:

Surgically absent.  No mass identified in the nephrectomy bed.

Bladder:

Appears normal for the degree of distention. A right ureteral jet is
visualized.

Other:

None.
IMPRESSION: Mildly increased right renal cortical echogenicity suspicious for
medical renal disease. No hydronephrosis or focal abnormality
identified. Previous left nephrectomy.

## 2021-09-04 ENCOUNTER — Inpatient Hospital Stay: Payer: Self-pay

## 2021-09-04 ENCOUNTER — Inpatient Hospital Stay (HOSPITAL_BASED_OUTPATIENT_CLINIC_OR_DEPARTMENT_OTHER): Payer: Self-pay | Admitting: Oncology

## 2021-09-04 ENCOUNTER — Encounter: Payer: Self-pay | Admitting: Oncology

## 2021-09-04 VITALS — BP 138/79 | HR 60 | Temp 97.9°F | Resp 18 | Wt 162.8 lb

## 2021-09-04 DIAGNOSIS — Z5112 Encounter for antineoplastic immunotherapy: Secondary | ICD-10-CM

## 2021-09-04 DIAGNOSIS — G969 Disorder of central nervous system, unspecified: Secondary | ICD-10-CM

## 2021-09-04 DIAGNOSIS — N1832 Chronic kidney disease, stage 3b: Secondary | ICD-10-CM

## 2021-09-04 DIAGNOSIS — C642 Malignant neoplasm of left kidney, except renal pelvis: Secondary | ICD-10-CM

## 2021-09-04 LAB — COMPREHENSIVE METABOLIC PANEL
ALT: 26 U/L (ref 0–44)
AST: 36 U/L (ref 15–41)
Albumin: 4 g/dL (ref 3.5–5.0)
Alkaline Phosphatase: 87 U/L (ref 38–126)
Anion gap: 8 (ref 5–15)
BUN: 38 mg/dL — ABNORMAL HIGH (ref 6–20)
CO2: 21 mmol/L — ABNORMAL LOW (ref 22–32)
Calcium: 8.6 mg/dL — ABNORMAL LOW (ref 8.9–10.3)
Chloride: 106 mmol/L (ref 98–111)
Creatinine, Ser: 2.07 mg/dL — ABNORMAL HIGH (ref 0.61–1.24)
GFR, Estimated: 36 mL/min — ABNORMAL LOW (ref >60)
Glucose, Bld: 110 mg/dL — ABNORMAL HIGH (ref 70–99)
Potassium: 4.9 mmol/L (ref 3.5–5.1)
Sodium: 135 mmol/L (ref 135–145)
Total Bilirubin: 0.7 mg/dL (ref 0.3–1.2)
Total Protein: 6.8 g/dL (ref 6.5–8.1)

## 2021-09-04 LAB — CBC WITH DIFFERENTIAL/PLATELET
Abs Immature Granulocytes: 0.02 10*3/uL (ref 0.00–0.07)
Basophils Absolute: 0.1 10*3/uL (ref 0.0–0.1)
Basophils Relative: 1 %
Eosinophils Absolute: 0.3 10*3/uL (ref 0.0–0.5)
Eosinophils Relative: 4 %
HCT: 36.9 % — ABNORMAL LOW (ref 39.0–52.0)
Hemoglobin: 11.4 g/dL — ABNORMAL LOW (ref 13.0–17.0)
Immature Granulocytes: 0 %
Lymphocytes Relative: 21 %
Lymphs Abs: 1.8 10*3/uL (ref 0.7–4.0)
MCH: 24.9 pg — ABNORMAL LOW (ref 26.0–34.0)
MCHC: 30.9 g/dL (ref 30.0–36.0)
MCV: 80.6 fL (ref 80.0–100.0)
Monocytes Absolute: 1.1 10*3/uL — ABNORMAL HIGH (ref 0.1–1.0)
Monocytes Relative: 12 %
Neutro Abs: 5.3 10*3/uL (ref 1.7–7.7)
Neutrophils Relative %: 62 %
Platelets: 168 10*3/uL (ref 150–400)
RBC: 4.58 MIL/uL (ref 4.22–5.81)
RDW: 16.5 % — ABNORMAL HIGH (ref 11.5–15.5)
WBC: 8.5 10*3/uL (ref 4.0–10.5)
nRBC: 0 % (ref 0.0–0.2)

## 2021-09-04 LAB — T4, FREE: Free T4: 0.81 ng/dL (ref 0.61–1.12)

## 2021-09-04 LAB — TSH: TSH: 2.147 u[IU]/mL (ref 0.350–4.500)

## 2021-09-04 MED ORDER — SODIUM CHLORIDE 0.9 % IV SOLN
200.0000 mg | Freq: Once | INTRAVENOUS | Status: AC
  Administered 2021-09-04: 200 mg via INTRAVENOUS
  Filled 2021-09-04: qty 8

## 2021-09-04 MED ORDER — SODIUM CHLORIDE 0.9 % IV SOLN
Freq: Once | INTRAVENOUS | Status: AC
  Filled 2021-09-04: qty 250

## 2021-09-04 NOTE — Progress Notes (Signed)
Pt here for follow up. No new concerns voiced.   

## 2021-09-04 NOTE — Assessment & Plan Note (Signed)
neurocysticercosis finished therapy. Repeat MRI brain showed decrease lesion size.

## 2021-09-04 NOTE — Progress Notes (Signed)
ON PATHWAY REGIMEN - Renal Cell  No Change  Continue With Treatment as Ordered.  Original Decision Date/Time: 05/23/2019 08:59     A cycle is every 21 days:     Axitinib      Pembrolizumab   **Always confirm dose/schedule in your pharmacy ordering system**  Patient Characteristics: Stage IV/Metastatic Disease, Clear Cell, First Line, Intermediate or Poor Risk Therapeutic Status: Stage IV/Metastatic Disease Histology: Clear Cell Line of Therapy: First Line Risk Status: Intermediate Risk Intent of Therapy: Non-Curative / Palliative Intent, Discussed with Patient

## 2021-09-04 NOTE — Patient Instructions (Signed)
Instrucciones al darle de alta: Discharge Instructions Gracias por elegir al [facility] para brindarle atencin mdica de oncologa y hematologa.   Si usted tiene una cita de laboratorio con el [facility], por favor vaya directamente al [facility] y regstrese en el rea de registro.   Use ropa cmoda y adecuada para tener fcil acceso a las vas del Portacath (acceso venoso de larga duracin) o la lnea PICC (catter central colocado por va perifrica).   Nos esforzamos por ofrecerle tiempo de calidad con su proveedor. Es posible que tenga que volver a programar su cita si llega tarde (15 minutos o ms).  El llegar tarde le afecta a usted y a otros pacientes cuyas citas son posteriores a la suya.  Adems, si usted falta a tres o ms citas sin avisar a la oficina, puede ser retirado(a) de la clnica a discrecin del proveedor.      Para las solicitudes de renovacin de recetas, pida a su farmacia que se ponga en contacto con nuestra oficina y deje que transcurran 72 horas para que se complete el proceso de las renovaciones.    Hoy usted recibi los siguientes agentes de quimioterapia e/o inmunoterapia- Keytruda      Para ayudar a prevenir las nuseas y los vmitos despus de su tratamiento, le recomendamos que tome su medicamento para las nuseas segn las indicaciones.  LOS SNTOMAS QUE DEBEN COMUNICARSE INMEDIATAMENTE SE INDICAN A CONTINUACIN: *FIEBRE SUPERIOR A 100.4 F (38 C) O MS *ESCALOFROS O SUDORACIN *NUSEAS Y VMITOS QUE NO SE CONTROLAN CON EL MEDICAMENTO PARA LAS NUSEAS *DIFICULTAD INUSUAL PARA RESPIRAR  *MORETONES O HEMORRAGIAS NO HABITUALES *PROBLEMAS URINARIOS (dolor o ardor al orinar o frecuencia para orinar) *PROBLEMAS INTESTINALES (diarrea inusual, estreimiento, dolor cerca del ano) SENSIBILIDAD EN LA BOCA Y EN LA GARGANTA CON O SIN LA PRESENCIA DE LCERAS (dolor de garganta, llagas en la boca o dolor de muelas/dientes) ERUPCIN,  HINCHAZN O DOLORES INUSUALES FLUJO VAGINAL INUSUAL O PICAZN/RASQUIA    Los puntos marcados con un asterisco ( *) indican una posible emergencia y debe hacer un seguimiento tan pronto como le sea posible o vaya al Departamento de Emergencias si se le presenta algn problema.  Por favor, muestre la TARJETA DE ADVERTENCIA DE QUIMIOTERAPIA O LA TARJETA DE ADVERTENCIA DE INMUNOTERAPIA al registrarse en el Departamento de Emergencias y a la enfermera de triaje.  Si tiene preguntas despus de su visita o necesita cancelar o volver a programar su cita, por favor pngase en contacto con [facility]  [phone]  y siga las instrucciones. Las horas de oficina son de 8:00 a.m. a 4:30 p.m. de lunes a viernes. Por favor, tenga en cuenta que los mensajes de voz que se dejan despus de las 4:00 p.m. posiblemente no se devolvern hasta el siguiente da de trabajo.  Cerramos los fines de semana y los das festivos importantes. En todo momento tiene acceso a una enfermera para preguntas urgentes. Por favor, llame al nmero principal de la clnica  [phone] y siga las instrucciones.   Para cualquier pregunta que no sea de carcter urgente, tambin puede ponerse en contacto con su proveedor utilizando MyChart. Ahora ofrecemos visitas electrnicas para cualquier persona mayor de 18 aos que solicite atencin mdica en lnea para los sntomas que no sean urgentes. Para ms detalles vaya a [email or web address].   Tambin puede bajar la aplicacin de MyChart! Vaya a la tienda de aplicaciones, busque "MyChart", abra la aplicacin, seleccione   [place], e ingrese con su nombre de usuario y la contrasea de MyChart.  Las mscaras son opcionales en los centros de cncer. Si desea que su equipo de cuidados mdicos use una mscara mientras le atienden, por favor hgaselo saber al personal. Puede tener una persona de apoyo que tenga por lo menos 16 aos para que le acompae a sus  citas. 

## 2021-09-04 NOTE — Assessment & Plan Note (Signed)
avoid nephrotoxins. Encourage oral hydration 

## 2021-09-04 NOTE — Assessment & Plan Note (Addendum)
Stage IV renal cell carcinoma, Previously did not tolerate axitinib. August 2023 CT scan showed NED Labs reviewed and discussed with patient Proceed with Keytruda.

## 2021-09-04 NOTE — Progress Notes (Signed)
Hematology/Oncology Progress note [phone] [phone]    Patient Care Team: Center, [facility] as PCP - General (General Practice) Earlie Server, MD as Consulting Physician (Oncology)   Name of the patient: Mark Donaldson  [id number]  [birth year]  Date of visit: 09/04/21  ASSESSMENT & PLAN:   Cancer Staging  Renal cell carcinoma of left kidney [facility]) Staging form: Kidney, AJCC 8th Edition - Pathologic: Stage IV (pT3a, pNX, cM1) - Signed by Earlie Server, MD on 05/23/2019   Renal cell carcinoma of left kidney ([place]) Stage IV renal cell carcinoma, Previously did not tolerate axitinib. Labs reviewed and discussed with patient Proceed with Keytruda.     Encounter for antineoplastic immunotherapy Immunotherapy plan as listed above  Stage 3b chronic kidney disease ([place]) avoid nephrotoxins. Encourage oral hydration  CNS lesion neurocysticercosis finished therapy. Repeat MRI brain showed decrease lesion size. No orders of the defined types were placed in this encounter.    Patient is Spanish-speaking.   Spanish interpreter service utilized for entire encounter   Follow up lab MD 3 weeks Keytruda, obtain CT chest abdomen pelvis in mid August 2023. All questions were answered. The patient knows to call the clinic with any problems, questions or concerns.  Earlie Server, MD, PhD [facility] Hematology Oncology 09/04/2021   PERTINENT HISTORY-  61 y.o. male presents for follow-up of renal cell carcinoma treatments. Oncology History  Renal cell carcinoma of left kidney ([place])  05/23/2019 Initial Diagnosis   Renal cell carcinoma of left kidney   -05/02/2019-05/07/2019 due to gross hematuria. CT showed a 7.4 cm left lower pole renal mass concerning for RCC.  Patient also has necrotic lymphadenopathy in the mediastinum and the right supra hilar region which were highly suspicious for metastatic disease.  Bilateral lung nodules, also concerning for  metastatic lung disease.  Right retrocrural lymphadenopathy with upper normal left periaortic lymph node. Patient was recommended for cytoreductive radical nephrectomy.  With history of ANCA positive vasculitis, also recommend thoracic lymphadenopathy biopsy to confirm distal metastasis.  Patient agreed to radical nephrectomy and declined bronchoscopy biopsy. He underwent left radical nephrectomy on 05/05/2019. Pathology showed pT3a pNx, RCC, conventional clear cell type, grade 3    05/23/2019 Cancer Staging   Staging form: Kidney, AJCC 8th Edition - Pathologic: Stage IV (pT3a, pNX, cM1) - Signed by Earlie Server, MD on 05/23/2019   06/07/2019 -  Chemotherapy   Keytruda + Axitinib.  06/07/19 He started Axitinib '5mg'$  BID, stopped on 07/25/2020 due to AKI, resumed on 10/04/2019, stopped again on 05/01/2020 due to diarrhea. 09/11/2020 resumed axitinib 5 mg twice daily, stopped on 10/23/2020 due to diarrhea.  01/17/2021 Keytruda was held due to transaminitis, immunotherapy induced liver toxicities. 02/20/2021, resumed Keytruda. Axitinib continues to be held.   01/12/2020 Imaging   CT showed overall marked improvement.  Comparing to CT scan in August 2021, continues to have treatment response.Mediastinal soft tissue nodule nearly completely resolved, right minor fissure nodule 8 mm, decreased in size.  Resolution of pleural fluid and thickening of the right chest.  Small pleural effusion in the left chest.  No new lung nodules.-Subtle variation of the muscle architecture of the left psoas muscle of unknown significance   04/30/2020 Imaging   CT chest abdomen pelvis without contrast showed stable disease   11/28/2020 Imaging   CT abdomen pelvis without contrast showed stable examination.  No evidence of new or progressive disease in the chest abdomen pelvis.  Stable to minimally decreased size of 5 mm pulmonary  nodule in the minor fissure in the right chest.  Sigmoid colon diverticulosis without findings of acute  diverticulitis.   03/08/2021 Imaging   CT scan showed NED   07/31/2021 Imaging   CT chest abdomen pelvis showed stable disease   08/27/2021 Imaging   MRI brain with and without contrast Decrease in size of lesions presumably reflecting neurocysticercosis. No evidence new metastatic disease   08/29/2021 Imaging   CT Chest abdomen pelvis w contrast 1. No findings of active malignancy. 6 by 4 mm right upper lobe nodule along the major fissure is stable and likely represents a previously treated metastatic lesion. 2. Coronary artery atherosclerosis disproportionate to atherosclerosis elsewhere in the chest, abdomen, and pelvis. 3. Mild surgical bronchiectasis in both lower lobes. 4. Mild prostatomegaly. 5. 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  Patient has been referred to orthopedic surgeon in [place] which he is not able to go due to transportation.  He does not have insurance as well.  He is currently asymptomatic.  Monitor.  Patient has past medical history CAD status post PCI to LAD in 2018, on aspirin and Plavix, CHF, history of focal sclerosing/crescentic GN-ANCA positive and CKD stage III. 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 He finished 14 days of albendazole plus praziquantel .   He has had few side effects including poor appetite, hair loss, weight loss, bloody stool, abdominal cramping or diarrhea. All symptoms have improved since he completed the treatment. 03/16/2020 he finished tapering course of steroids  #Intermittent orthopnea,Pre-existing diastolic CHF, 8/46/9629 LVEF 60 to 65%.   Recommend him to follow-up with cardiology-he is has not able to follow-up with cardiology due to lack of insurance coverage.  # He has frequent urination at night.  Previously tried Cardura for BPH and had to stop due hypotension.  INTERVAL HISTORY Mark Donaldson is a 61 y.o. male who has above history reviewed by me today presents for follow up visit for metastatic RCC Patient tolerates immunotherapy well.  He has no new complaints.   Review of systems- Review of Systems  Constitutional:  Negative for appetite change, chills, fatigue, fever and unexpected weight change.  HENT:   Negative for voice change.   Eyes:  Negative for eye problems and icterus.  Respiratory:  Negative for chest tightness and cough.   Cardiovascular:  Negative for chest pain and leg swelling.  Gastrointestinal:  Negative for abdominal distention, abdominal pain and diarrhea.  Endocrine: Negative for hot flashes.  Genitourinary:  Positive for nocturia. Negative for difficulty urinating, dysuria and frequency.   Musculoskeletal:  Positive for arthralgias.  Skin:  Negative for itching and rash.  Neurological:  Negative for light-headedness and numbness.  Hematological:  Negative for adenopathy. Does not bruise/bleed easily.  Psychiatric/Behavioral:  Negative for confusion.     No Known Allergies  Patient Active Problem List   Diagnosis Date Noted   Renal cell carcinoma of left kidney ([place]) 05/23/2019    Priority: High   Stage 3b chronic kidney disease ([place]) 10/04/2019    Priority: Medium    Encounter for antineoplastic immunotherapy 07/19/2019    Priority: Medium    Encounter for antineoplastic chemotherapy 07/19/2019    Priority: Medium    CNS lesion 07/07/2019    Priority: Medium    Goals of care, counseling/discussion 05/23/2019    Priority: Low   Avascular necrosis of bones of both hips (  [place]) 06/20/2021   Transaminitis 01/17/2021   History of CHF (congestive heart failure) 05/01/2020   Hyperglycemia 02/28/2020   Brain lesion 10/04/2019   Anemia in stage 3b chronic kidney disease ([place]) 06/07/2019   Renal cell carcinoma ([place]) 05/23/2019   Palliative care encounter    History of vasculitis    Thoracic lymphadenopathy    Lung nodule    CAD  S/P percutaneous coronary angioplasty 05/02/2019   Kidney mass 05/02/2019   Hydronephrosis of left kidney 05/02/2019   UTI (urinary tract infection) 05/02/2019   AKI (acute kidney injury) ([place]) 05/02/2019   Gross hematuria 05/02/2019   Chronic diastolic CHF (congestive heart failure) ([place]) 05/02/2019   Sepsis ([place]) 05/02/2019   Severe sepsis ([place]) 05/02/2019   Preop cardiovascular exam    Hyperlipidemia 05/21/2016   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 ([place]) 05/21/2016   Prediabetes 05/21/2016   Alcohol abuse 05/18/2016   Essential hypertension 05/18/2016   Vasculitis, 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[place]) 05/18/2016   Shortness of breath 05/17/2016     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HCC)    CAD (coronary artery disease)    Essential hypertension    HFrEF (heart failure with reduced ejection fraction) (HCC)    Hyperlipidemia LDL goal <70    Ischemic cardiomyopathy    Renal cell carcinoma ([place]) 05/23/2019   Renal disorder      Past Surgical History:  Procedure Laterality Date   CARDIAC SURGERY     cardiac cath with 2 stent placement   LAPAROSCOPIC NEPHRECTOMY, HAND ASSISTED Left 05/05/2019   Procedure: HAND ASSISTED LAPAROSCOPIC NEPHRECTOMY;  Surgeon: Hollice Espy, MD;  Location: [facility] ORS;  Service: Urology;  Laterality: Left;    Social History   Socioeconomic History   Marital status: Married    Spouse name: Not on file   Number of children: Not on file   Years of education: Not on file   Highest education level: Not on file  Occupational History   Not on file  Tobacco Use   Smoking status: Never   Smokeless tobacco: Never  Substance and Sexual Activity   Alcohol use: Yes   Drug use: Not on file   Sexual activity: Not on file  Other Topics Concern   Not on file  Social History Narrative   Not on file   Social Determinants of Health   Financial Resource Strain: Not on file  Food Insecurity: Not on file  Transportation  Needs: Not on file  Physical Activity: Not on file  Stress: Not on file  Social Connections: Not on file  Intimate Partner Violence: Not on file     Family History  Problem Relation Age of Onset   Cancer Mother    Blindness Brother      Current Outpatient Medications:    aspirin 81 MG EC tablet, Take 81 mg by mouth daily. Swallow whole., Disp: , Rfl:    atorvastatin (LIPITOR) 80 MG tablet, Take 80 mg by mouth daily., Disp: , Rfl:    clopidogrel (PLAVIX) 75 MG tablet, Take 75 mg by mouth daily., Disp: , Rfl:    furosemide (LASIX) 20 MG tablet, Take 1 tablet (20 mg total) by mouth daily., Disp: 30 tablet, Rfl: 3   KEYTRUDA 100 MG/4ML SOLN, INJECT '200MG'$  INTRAVENOUSLY EVERY 3 WEEKS, Disp: 8 mL, Rfl: 11   lisinopril (ZESTRIL) 10 MG tablet, Take 10 mg by mouth daily., Disp: , Rfl:    metoprolol succinate (TOPROL-XL) 100 MG 24 hr tablet,  Take 1.5 tablets by mouth daily., Disp: , Rfl:    loperamide (IMODIUM) 2 MG capsule, Take 1 capsule (2 mg total) by mouth See admin instructions. Take 2 tablets with onset of diarrhea, then 1 tablet for every 2 hours until diarrhea stops. Maximum 8 tablets per 24 hours. (Patient not taking: Reported on 07/24/2021), Disp: 60 capsule, Rfl: 2   Physical exam:  Vitals:   09/04/21 0922  BP: 138/79  Pulse: 60  Resp: 18  Temp: 97.9 F (36.6 C)  Weight: 162 lb 12.8 oz (73.8 kg)    Physical Exam Constitutional:      General: He is not in acute distress.    Appearance: He is not diaphoretic.  HENT:     Head: Normocephalic and atraumatic.     Nose: Nose normal.     Mouth/Throat:     Pharynx: No oropharyngeal exudate.  Eyes:     General: No scleral icterus.    Pupils: Pupils are equal, round, and reactive to light.  Cardiovascular:     Rate and Rhythm: Normal rate and regular rhythm.     Heart sounds: No murmur heard. Pulmonary:     Effort: Pulmonary effort is normal. No respiratory distress.     Breath sounds: No rales.  Chest:     Chest wall: No  tenderness.  Abdominal:     General: There is no distension.     Palpations: Abdomen is soft.     Tenderness: There is no abdominal tenderness.  Musculoskeletal:        General: Normal range of motion.     Cervical back: Normal range of motion and neck supple.  Skin:    General: Skin is warm and dry.     Findings: No erythema.  Neurological:     Mental Status: He is alert and oriented to person, place, and time.     Cranial Nerves: No cranial nerve deficit.     Motor: No abnormal muscle tone.     Coordination: Coordination normal.  Psychiatric:        Mood and Affect: Affect normal.           Latest Ref Rng & Units 09/04/2021    9:11 AM  CMP  Glucose 70 - 99 mg/dL 110   BUN 6 - 20 mg/dL 38   Creatinine 0.61 - 1.24 mg/dL 2.07   Sodium 135 - 145 mmol/L 135   Potassium 3.5 - 5.1 mmol/L 4.9   Chloride 98 - 111 mmol/L 106   CO2 22 - 32 mmol/L 21   Calcium 8.9 - 10.3 mg/dL 8.6   Total Protein 6.5 - 8.1 g/dL 6.8   Total Bilirubin 0.3 - 1.2 mg/dL 0.7   Alkaline Phos 38 - 126 U/L 87   AST 15 - 41 U/L 36   ALT 0 - 44 U/L 26       Latest Ref Rng & Units 09/04/2021    9:11 AM  CBC  WBC 4.0 - 10.5 K/uL 8.5   Hemoglobin 13.0 - 17.0 g/dL 11.4   Hematocrit 39.0 - 52.0 % 36.9   Platelets 150 - 400 K/uL 168     RADIOGRAPHIC STUDIES: I have personally reviewed the radiological images as listed and agreed with the findings in the report. CT CHEST ABDOMEN PELVIS WO CONTRAST  Result Date: 08/29/2021 CLINICAL DATA:  Left nephrectomy for renal cell carcinoma. Restaging assessment. * Tracking Code: BO * EXAM: CT CHEST, ABDOMEN AND PELVIS WITHOUT CONTRAST TECHNIQUE: Multidetector CT imaging  of the chest, abdomen and pelvis was performed following the standard protocol without IV contrast. RADIATION DOSE REDUCTION: This exam was performed according to the departmental dose-optimization program which includes automated exposure control, adjustment of the mA and/or kV according to patient  size and/or use of iterative reconstruction technique. COMPARISON:  Multiple exams, including 05/28/2021 FINDINGS: CT CHEST FINDINGS Cardiovascular: Left anterior descending, right, and circumflex coronary artery atherosclerotic disease. Mediastinum/Nodes: Unremarkable Lungs/Pleura: Right upper lobe pulmonary nodule along the major fissure, 6 by 4 mm on image 76 series 3, stable from 05/28/2021. This nodule was substantially larger back on 05/03/2019. No other significant nodules identified. Mild cylindrical bronchiectasis in the lower lobes. Musculoskeletal: Unremarkable CT ABDOMEN PELVIS FINDINGS Hepatobiliary: Unremarkable Pancreas: Unremarkable Spleen: Unremarkable Adrenals/Urinary Tract: Both adrenal glands appear normal. Left nephrectomy without recurrent mass along the nephrectomy bed. Right renal contour unremarkable. Stomach/Bowel: Unremarkable Vascular/Lymphatic: Unremarkable Reproductive: Mild prostatomegaly. Other: No supplemental non-categorized findings. Musculoskeletal: Mild findings of chronic avascular necrosis in both femoral heads without flattening or collapse. IMPRESSION: 1. No findings of active malignancy. 6 by 4 mm right upper lobe nodule along the major fissure is stable and likely represents a previously treated metastatic lesion. 2. Coronary artery atherosclerosis disproportionate to atherosclerosis elsewhere in the chest, abdomen, and pelvis. 3. Mild surgical bronchiectasis in both lower lobes. 4. Mild prostatomegaly. 5. 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 Electronically Signed   By: Van Clines M.D.   On: 08/29/2021 11:06   MR Brain W Wo Contrast  Result Date: 08/27/2021 CLINICAL DATA:  Renal cell carcinoma, history of neurocysticercosis EXAM: MRI HEAD WITHOUT AND WITH CONTRAST TECHNIQUE: Multiplanar, multiecho pulse sequences of the brain and surrounding structures were obtained without and with intravenous contrast. CONTRAST:  47m GADAVIST  GADOBUTROL 1 MMOL/ML IV SOLN COMPARISON:  08/13/2020 FINDINGS: Brain: Peripherally enhancing lesions on the prior study have decreased in size. For example, right parietal lesion measures 9 mm on series 18, image 109 (previously 15 mm) and right posterior parietal lesion measures 14 mm on image 99 (previously 17 mm). Nearly resolved adjacent T2 FLAIR hyperintensity. Multiple foci of susceptibility are again identified particularly in the posterior temporal and occipital lobes and cerebellum. These probably reflects calcification rather than chronic blood products given history. There is no acute infarction.  No new mass or abnormal enhancement. Vascular: Major vessel flow voids at the skull base are preserved. Skull and upper cervical spine: Normal marrow signal is preserved. Sinuses/Orbits: Paranasal sinuses are aerated. Orbits are unremarkable. Other: Sella is unremarkable.  Mastoid air cells are clear. IMPRESSION: Decrease in size of lesions presumably reflecting neurocysticercosis. No evidence new metastatic disease. Electronically Signed   By: PMacy MisM.D.   On: 08/27/2021 15:52   XR Pelvis 1-2 Views  Result Date: 06/20/2021 Tiny sclerotic areas of AVN without collapse

## 2021-09-04 NOTE — Assessment & Plan Note (Signed)
Immunotherapy plan as listed above 

## 2021-09-05 ENCOUNTER — Other Ambulatory Visit: Payer: Self-pay

## 2021-09-11 ENCOUNTER — Other Ambulatory Visit: Payer: Self-pay

## 2021-09-13 ENCOUNTER — Other Ambulatory Visit: Payer: Self-pay

## 2021-09-25 ENCOUNTER — Inpatient Hospital Stay (HOSPITAL_BASED_OUTPATIENT_CLINIC_OR_DEPARTMENT_OTHER): Payer: Self-pay | Admitting: Oncology

## 2021-09-25 ENCOUNTER — Encounter: Payer: Self-pay | Admitting: Oncology

## 2021-09-25 ENCOUNTER — Inpatient Hospital Stay: Payer: Self-pay | Attending: Oncology

## 2021-09-25 ENCOUNTER — Inpatient Hospital Stay: Payer: Self-pay

## 2021-09-25 VITALS — BP 112/74 | HR 66 | Temp 97.8°F | Resp 20 | Wt 161.0 lb

## 2021-09-25 DIAGNOSIS — Z5112 Encounter for antineoplastic immunotherapy: Secondary | ICD-10-CM

## 2021-09-25 DIAGNOSIS — I5042 Chronic combined systolic (congestive) and diastolic (congestive) heart failure: Secondary | ICD-10-CM | POA: Insufficient documentation

## 2021-09-25 DIAGNOSIS — I130 Hypertensive heart and chronic kidney disease with heart failure and stage 1 through stage 4 chronic kidney disease, or unspecified chronic kidney disease: Secondary | ICD-10-CM | POA: Insufficient documentation

## 2021-09-25 DIAGNOSIS — N1832 Chronic kidney disease, stage 3b: Secondary | ICD-10-CM

## 2021-09-25 DIAGNOSIS — R350 Frequency of micturition: Secondary | ICD-10-CM | POA: Insufficient documentation

## 2021-09-25 DIAGNOSIS — C642 Malignant neoplasm of left kidney, except renal pelvis: Secondary | ICD-10-CM

## 2021-09-25 DIAGNOSIS — D631 Anemia in chronic kidney disease: Secondary | ICD-10-CM | POA: Insufficient documentation

## 2021-09-25 DIAGNOSIS — G969 Disorder of central nervous system, unspecified: Secondary | ICD-10-CM

## 2021-09-25 DIAGNOSIS — Z79899 Other long term (current) drug therapy: Secondary | ICD-10-CM | POA: Insufficient documentation

## 2021-09-25 DIAGNOSIS — Z905 Acquired absence of kidney: Secondary | ICD-10-CM | POA: Insufficient documentation

## 2021-09-25 DIAGNOSIS — N401 Enlarged prostate with lower urinary tract symptoms: Secondary | ICD-10-CM | POA: Insufficient documentation

## 2021-09-25 DIAGNOSIS — Z7982 Long term (current) use of aspirin: Secondary | ICD-10-CM | POA: Insufficient documentation

## 2021-09-25 DIAGNOSIS — Z7902 Long term (current) use of antithrombotics/antiplatelets: Secondary | ICD-10-CM | POA: Insufficient documentation

## 2021-09-25 LAB — COMPREHENSIVE METABOLIC PANEL
ALT: 20 U/L (ref 0–44)
AST: 25 U/L (ref 15–41)
Albumin: 3.9 g/dL (ref 3.5–5.0)
Alkaline Phosphatase: 103 U/L (ref 38–126)
Anion gap: 4 — ABNORMAL LOW (ref 5–15)
BUN: 43 mg/dL — ABNORMAL HIGH (ref 6–20)
CO2: 23 mmol/L (ref 22–32)
Calcium: 8.7 mg/dL — ABNORMAL LOW (ref 8.9–10.3)
Chloride: 110 mmol/L (ref 98–111)
Creatinine, Ser: 2 mg/dL — ABNORMAL HIGH (ref 0.61–1.24)
GFR, Estimated: 38 mL/min — ABNORMAL LOW (ref >60)
Glucose, Bld: 112 mg/dL — ABNORMAL HIGH (ref 70–99)
Potassium: 4.8 mmol/L (ref 3.5–5.1)
Sodium: 137 mmol/L (ref 135–145)
Total Bilirubin: 0.6 mg/dL (ref 0.3–1.2)
Total Protein: 6.9 g/dL (ref 6.5–8.1)

## 2021-09-25 LAB — CBC WITH DIFFERENTIAL/PLATELET
Abs Immature Granulocytes: 0.03 10*3/uL (ref 0.00–0.07)
Basophils Absolute: 0.1 10*3/uL (ref 0.0–0.1)
Basophils Relative: 1 %
Eosinophils Absolute: 0.7 10*3/uL — ABNORMAL HIGH (ref 0.0–0.5)
Eosinophils Relative: 7 %
HCT: 38.8 % — ABNORMAL LOW (ref 39.0–52.0)
Hemoglobin: 11.9 g/dL — ABNORMAL LOW (ref 13.0–17.0)
Immature Granulocytes: 0 %
Lymphocytes Relative: 21 %
Lymphs Abs: 2 10*3/uL (ref 0.7–4.0)
MCH: 24.7 pg — ABNORMAL LOW (ref 26.0–34.0)
MCHC: 30.7 g/dL (ref 30.0–36.0)
MCV: 80.7 fL (ref 80.0–100.0)
Monocytes Absolute: 1 10*3/uL (ref 0.1–1.0)
Monocytes Relative: 10 %
Neutro Abs: 6 10*3/uL (ref 1.7–7.7)
Neutrophils Relative %: 61 %
Platelets: 172 10*3/uL (ref 150–400)
RBC: 4.81 MIL/uL (ref 4.22–5.81)
RDW: 16.6 % — ABNORMAL HIGH (ref 11.5–15.5)
WBC: 9.7 10*3/uL (ref 4.0–10.5)
nRBC: 0 % (ref 0.0–0.2)

## 2021-09-25 MED ORDER — SODIUM CHLORIDE 0.9 % IV SOLN
200.0000 mg | Freq: Once | INTRAVENOUS | Status: AC
  Administered 2021-09-25: 200 mg via INTRAVENOUS
  Filled 2021-09-25: qty 8

## 2021-09-25 MED ORDER — SODIUM CHLORIDE 0.9 % IV SOLN
Freq: Once | INTRAVENOUS | Status: AC
  Filled 2021-09-25: qty 250

## 2021-09-25 NOTE — Assessment & Plan Note (Signed)
Immunotherapy plan as listed above 

## 2021-09-25 NOTE — Assessment & Plan Note (Signed)
avoid nephrotoxins. Encourage oral hydration 

## 2021-09-25 NOTE — Progress Notes (Signed)
Hematology/Oncology Progress note [phone] [phone]    Patient Care Team: Center, [facility] as PCP - General (General Practice) Earlie Server, MD as Consulting Physician (Oncology)   Name of the patient: Mark Donaldson  [id number]  10/26/1960  Date of visit: 09/25/21  ASSESSMENT & PLAN:   Cancer Staging  Renal cell carcinoma of left kidney [facility]) Staging form: Kidney, AJCC 8th Edition - Pathologic: Stage IV (pT3a, pNX, cM1) - Signed by Earlie Server, MD on 05/23/2019   Renal cell carcinoma of left kidney ([place]) Stage IV renal cell carcinoma, Previously did not tolerate axitinib. August 2023 CT scan showed NED Labs reviewed and discussed with patient Proceed with Keytruda.  He will finish 34 cycles of Keytruda [ about 2 years's treatment] CT showed NED.  Recommend to discontinue treatment and close monitor Obtain CT in Nov 2023.     CNS lesion neurocysticercosis finished therapy. Repeat MRI brain showed decrease lesion size.  Encounter for antineoplastic immunotherapy Immunotherapy plan as listed above  Stage 3b chronic kidney disease ([place]) avoid nephrotoxins. Encourage oral hydration No orders of the defined types were placed in this encounter.    Patient is Spanish-speaking.   Spanish interpreter service utilized for entire encounter   Return visit  Obtain CT chest abdomen pelvis in early Nov 2023. Follow up after CT.   All questions were answered. The patient knows to call the clinic with any problems, questions or concerns.  Earlie Server, MD, PhD [facility] Hematology Oncology 09/25/2021   PERTINENT HISTORY-  61 y.o. male presents for follow-up of renal cell carcinoma treatments. Oncology History  Renal cell carcinoma of left kidney ([place])  05/23/2019 Initial Diagnosis   Renal cell carcinoma of left kidney   -05/02/2019-05/07/2019 due to gross hematuria. CT showed a 7.4 cm left lower pole renal mass concerning for RCC.   Patient also has necrotic lymphadenopathy in the mediastinum and the right supra hilar region which were highly suspicious for metastatic disease.  Bilateral lung nodules, also concerning for metastatic lung disease.  Right retrocrural lymphadenopathy with upper normal left periaortic lymph node. Patient was recommended for cytoreductive radical nephrectomy.  With history of ANCA positive vasculitis, also recommend thoracic lymphadenopathy biopsy to confirm distal metastasis.  Patient agreed to radical nephrectomy and declined bronchoscopy biopsy. He underwent left radical nephrectomy on 05/05/2019. Pathology showed pT3a pNx, RCC, conventional clear cell type, grade 3    05/23/2019 Cancer Staging   Staging form: Kidney, AJCC 8th Edition - Pathologic: Stage IV (pT3a, pNX, cM1) - Signed by Earlie Server, MD on 05/23/2019   06/07/2019 -  Chemotherapy   Keytruda + Axitinib.  06/07/19 He started Axitinib '5mg'$  BID, stopped on 07/25/2020 due to AKI, resumed on 10/04/2019, stopped again on 05/01/2020 due to diarrhea. 09/11/2020 resumed axitinib 5 mg twice daily, stopped on 10/23/2020 due to diarrhea.  01/17/2021 Keytruda was held due to transaminitis, immunotherapy induced liver toxicities. 02/20/2021, resumed Keytruda. Axitinib continues to be held.   01/12/2020 Imaging   CT showed overall marked improvement.  Comparing to CT scan in August 2021, continues to have treatment response.Mediastinal soft tissue nodule nearly completely resolved, right minor fissure nodule 8 mm, decreased in size.  Resolution of pleural fluid and thickening of the right chest.  Small pleural effusion in the left chest.  No new lung nodules.-Subtle variation of the muscle architecture of the left psoas muscle of unknown significance   04/30/2020 Imaging   CT chest abdomen pelvis without contrast showed stable disease  11/28/2020 Imaging   CT abdomen pelvis without contrast showed stable examination.  No evidence of new or progressive disease  in the chest abdomen pelvis.  Stable to minimally decreased size of 5 mm pulmonary nodule in the minor fissure in the right chest.  Sigmoid colon diverticulosis without findings of acute diverticulitis.   03/08/2021 Imaging   CT scan showed NED   07/31/2021 Imaging   CT chest abdomen pelvis showed stable disease   08/27/2021 Imaging   MRI brain with and without contrast Decrease in size of lesions presumably reflecting neurocysticercosis. No evidence new metastatic disease   08/29/2021 Imaging   CT Chest abdomen pelvis w contrast 1. No findings of active malignancy. 6 by 4 mm right upper lobe nodule along the major fissure is stable and likely represents a previously treated metastatic lesion. 2. Coronary artery atherosclerosis disproportionate to atherosclerosis elsewhere in the chest, abdomen, and pelvis. 3. Mild surgical bronchiectasis in both lower lobes. 4. Mild prostatomegaly. 5. 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  Patient has been referred to orthopedic surgeon in [place] which he is not able to go due to transportation.  He does not have insurance as well.  He is currently asymptomatic.  Monitor.  Patient has past medical history CAD status post PCI to LAD in 2018, on aspirin and Plavix, CHF, history of focal sclerosing/crescentic GN-ANCA positive and CKD stage III. 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 He finished 14 days of albendazole plus praziquantel .   He has had few side effects including poor appetite, hair loss, weight loss, bloody stool, abdominal cramping or diarrhea. All symptoms have improved since he completed the treatment. 03/16/2020 he finished tapering course of steroids  #Intermittent orthopnea,Pre-existing diastolic CHF, 9/47/0962 LVEF  60 to 65%.   Recommend him to follow-up with cardiology-he is has not able to follow-up with cardiology due to lack of insurance coverage.  # He has frequent urination at night.  Previously tried Cardura for BPH and had to stop due hypotension.  INTERVAL HISTORY Santo Zahradnik is a 61 y.o. male who has above history reviewed by me today presents for follow up visit for metastatic RCC Patient tolerates immunotherapy well.  He has no new complaints.   Review of systems- Review of Systems  Constitutional:  Negative for appetite change, chills, fatigue, fever and unexpected weight change.  HENT:   Negative for voice change.   Eyes:  Negative for eye problems and icterus.  Respiratory:  Negative for chest tightness and cough.   Cardiovascular:  Negative for chest pain and leg swelling.  Gastrointestinal:  Negative for abdominal distention, abdominal pain and diarrhea.  Endocrine: Negative for hot flashes.  Genitourinary:  Positive for nocturia. Negative for difficulty urinating, dysuria and frequency.   Musculoskeletal:  Positive for arthralgias.  Skin:  Negative for itching and rash.  Neurological:  Negative for light-headedness and numbness.  Hematological:  Negative for adenopathy. Does not bruise/bleed easily.  Psychiatric/Behavioral:  Negative for confusion.     No Known Allergies  Patient Active Problem List   Diagnosis Date Noted   Renal cell carcinoma of left kidney ([place]) 05/23/2019    Priority: High   Stage 3b chronic kidney disease ([place]) 10/04/2019    Priority: Medium  Encounter for antineoplastic immunotherapy 07/19/2019    Priority: Medium    Encounter for antineoplastic chemotherapy 07/19/2019    Priority: Medium    CNS lesion 07/07/2019    Priority: Medium    Goals of care, counseling/discussion 05/23/2019    Priority: Low   Avascular necrosis of bones of both hips ([place]) 06/20/2021   Transaminitis 01/17/2021   History of CHF (congestive heart failure)  05/01/2020   Hyperglycemia 02/28/2020   Brain lesion 10/04/2019   Anemia in stage 3b chronic kidney disease ([place]) 06/07/2019   Renal cell carcinoma ([place]) 05/23/2019   Palliative care encounter    History of vasculitis    Thoracic lymphadenopathy    Lung nodule    CAD S/P percutaneous coronary angioplasty 05/02/2019   Kidney mass 05/02/2019   Hydronephrosis of left kidney 05/02/2019   UTI (urinary tract infection) 05/02/2019   AKI (acute kidney injury) ([place]) 05/02/2019   Gross hematuria 05/02/2019   Chronic diastolic CHF (congestive heart failure) ([place]) 05/02/2019   Sepsis ([place]) 05/02/2019   Severe sepsis ([place]) 05/02/2019   Preop cardiovascular exam    Hyperlipidemia 05/21/2016   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 ([place]) 05/21/2016   Prediabetes 05/21/2016   Alcohol abuse 05/18/2016   Essential hypertension 05/18/2016   Vasculitis, 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[place]) 05/18/2016   Shortness of breath 05/17/2016     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HCC)    CAD (coronary artery disease)    Essential hypertension    HFrEF (heart failure with reduced ejection fraction) (HCC)    Hyperlipidemia LDL goal <70    Ischemic cardiomyopathy    Renal cell carcinoma ([place]) 05/23/2019   Renal disorder      Past Surgical History:  Procedure Laterality Date   CARDIAC SURGERY     cardiac cath with 2 stent placement   LAPAROSCOPIC NEPHRECTOMY, HAND ASSISTED Left 05/05/2019   Procedure: HAND ASSISTED LAPAROSCOPIC NEPHRECTOMY;  Surgeon: Hollice Espy, MD;  Location: [facility] ORS;  Service: Urology;  Laterality: Left;    Social History   Socioeconomic History   Marital status: Married    Spouse name: Not on file   Number of children: Not on file   Years of education: Not on file   Highest education level: Not on file  Occupational History   Not on file  Tobacco Use   Smoking status: Never   Smokeless tobacco: Never  Substance and Sexual Activity    Alcohol use: Yes   Drug use: Not on file   Sexual activity: Not on file  Other Topics Concern   Not on file  Social History Narrative   Not on file   Social Determinants of Health   Financial Resource Strain: Not on file  Food Insecurity: Not on file  Transportation Needs: Not on file  Physical Activity: Not on file  Stress: Not on file  Social Connections: Not on file  Intimate Partner Violence: Not on file     Family History  Problem Relation Age of Onset   Cancer Mother    Blindness Brother      Current Outpatient Medications:    aspirin 81 MG EC tablet, Take 81 mg by mouth daily. Swallow whole., Disp: , Rfl:    atorvastatin (LIPITOR) 80 MG tablet, Take 80 mg by mouth daily., Disp: , Rfl:    clopidogrel (PLAVIX) 75 MG tablet, Take 75 mg by mouth daily., Disp: , Rfl:    furosemide (LASIX) 20 MG tablet, Take 1  tablet (20 mg total) by mouth daily., Disp: 30 tablet, Rfl: 3   KEYTRUDA 100 MG/4ML SOLN, INJECT '200MG'$  INTRAVENOUSLY EVERY 3 WEEKS, Disp: 8 mL, Rfl: 11   lisinopril (ZESTRIL) 10 MG tablet, Take 10 mg by mouth daily., Disp: , Rfl:    metoprolol succinate (TOPROL-XL) 100 MG 24 hr tablet, Take 1.5 tablets by mouth daily., Disp: , Rfl:    loperamide (IMODIUM) 2 MG capsule, Take 1 capsule (2 mg total) by mouth See admin instructions. Take 2 tablets with onset of diarrhea, then 1 tablet for every 2 hours until diarrhea stops. Maximum 8 tablets per 24 hours. (Patient not taking: Reported on 07/24/2021), Disp: 60 capsule, Rfl: 2   Physical exam:  Vitals:   09/25/21 0837  BP: 112/74  Pulse: 66  Resp: 20  Temp: 97.8 F (36.6 C)  SpO2: 100%  Weight: 161 lb (73 kg)    Physical Exam Constitutional:      General: He is not in acute distress.    Appearance: He is not diaphoretic.  HENT:     Head: Normocephalic and atraumatic.     Nose: Nose normal.     Mouth/Throat:     Pharynx: No oropharyngeal exudate.  Eyes:     General: No scleral icterus.    Pupils: Pupils are  equal, round, and reactive to light.  Cardiovascular:     Rate and Rhythm: Normal rate and regular rhythm.     Heart sounds: No murmur heard. Pulmonary:     Effort: Pulmonary effort is normal. No respiratory distress.     Breath sounds: No rales.  Chest:     Chest wall: No tenderness.  Abdominal:     General: There is no distension.     Palpations: Abdomen is soft.     Tenderness: There is no abdominal tenderness.  Musculoskeletal:        General: Normal range of motion.     Cervical back: Normal range of motion and neck supple.  Skin:    General: Skin is warm and dry.     Findings: No erythema.  Neurological:     Mental Status: He is alert and oriented to person, place, and time.     Cranial Nerves: No cranial nerve deficit.     Motor: No abnormal muscle tone.     Coordination: Coordination normal.  Psychiatric:        Mood and Affect: Affect normal.           Latest Ref Rng & Units 09/04/2021    9:11 AM  CMP  Glucose 70 - 99 mg/dL 110   BUN 6 - 20 mg/dL 38   Creatinine 0.61 - 1.24 mg/dL 2.07   Sodium 135 - 145 mmol/L 135   Potassium 3.5 - 5.1 mmol/L 4.9   Chloride 98 - 111 mmol/L 106   CO2 22 - 32 mmol/L 21   Calcium 8.9 - 10.3 mg/dL 8.6   Total Protein 6.5 - 8.1 g/dL 6.8   Total Bilirubin 0.3 - 1.2 mg/dL 0.7   Alkaline Phos 38 - 126 U/L 87   AST 15 - 41 U/L 36   ALT 0 - 44 U/L 26       Latest Ref Rng & Units 09/25/2021    8:23 AM  CBC  WBC 4.0 - 10.5 K/uL 9.7   Hemoglobin 13.0 - 17.0 g/dL 11.9   Hematocrit 39.0 - 52.0 % 38.8   Platelets 150 - 400 K/uL 172  RADIOGRAPHIC STUDIES: I have personally reviewed the radiological images as listed and agreed with the findings in the report. CT CHEST ABDOMEN PELVIS WO CONTRAST  Result Date: 08/29/2021 CLINICAL DATA:  Left nephrectomy for renal cell carcinoma. Restaging assessment. * Tracking Code: BO * EXAM: CT CHEST, ABDOMEN AND PELVIS WITHOUT CONTRAST TECHNIQUE: Multidetector CT imaging of the chest, abdomen  and pelvis was performed following the standard protocol without IV contrast. RADIATION DOSE REDUCTION: This exam was performed according to the departmental dose-optimization program which includes automated exposure control, adjustment of the mA and/or kV according to patient size and/or use of iterative reconstruction technique. COMPARISON:  Multiple exams, including 05/28/2021 FINDINGS: CT CHEST FINDINGS Cardiovascular: Left anterior descending, right, and circumflex coronary artery atherosclerotic disease. Mediastinum/Nodes: Unremarkable Lungs/Pleura: Right upper lobe pulmonary nodule along the major fissure, 6 by 4 mm on image 76 series 3, stable from 05/28/2021. This nodule was substantially larger back on 05/03/2019. No other significant nodules identified. Mild cylindrical bronchiectasis in the lower lobes. Musculoskeletal: Unremarkable CT ABDOMEN PELVIS FINDINGS Hepatobiliary: Unremarkable Pancreas: Unremarkable Spleen: Unremarkable Adrenals/Urinary Tract: Both adrenal glands appear normal. Left nephrectomy without recurrent mass along the nephrectomy bed. Right renal contour unremarkable. Stomach/Bowel: Unremarkable Vascular/Lymphatic: Unremarkable Reproductive: Mild prostatomegaly. Other: No supplemental non-categorized findings. Musculoskeletal: Mild findings of chronic avascular necrosis in both femoral heads without flattening or collapse. IMPRESSION: 1. No findings of active malignancy. 6 by 4 mm right upper lobe nodule along the major fissure is stable and likely represents a previously treated metastatic lesion. 2. Coronary artery atherosclerosis disproportionate to atherosclerosis elsewhere in the chest, abdomen, and pelvis. 3. Mild surgical bronchiectasis in both lower lobes. 4. Mild prostatomegaly. 5. 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 Electronically Signed   By: Van Clines M.D.   On: 08/29/2021 11:06   MR Brain W Wo Contrast  Result Date:  08/27/2021 CLINICAL DATA:  Renal cell carcinoma, history of neurocysticercosis EXAM: MRI HEAD WITHOUT AND WITH CONTRAST TECHNIQUE: Multiplanar, multiecho pulse sequences of the brain and surrounding structures were obtained without and with intravenous contrast. CONTRAST:  48m GADAVIST GADOBUTROL 1 MMOL/ML IV SOLN COMPARISON:  08/13/2020 FINDINGS: Brain: Peripherally enhancing lesions on the prior study have decreased in size. For example, right parietal lesion measures 9 mm on series 18, image 109 (previously 15 mm) and right posterior parietal lesion measures 14 mm on image 99 (previously 17 mm). Nearly resolved adjacent T2 FLAIR hyperintensity. Multiple foci of susceptibility are again identified particularly in the posterior temporal and occipital lobes and cerebellum. These probably reflects calcification rather than chronic blood products given history. There is no acute infarction.  No new mass or abnormal enhancement. Vascular: Major vessel flow voids at the skull base are preserved. Skull and upper cervical spine: Normal marrow signal is preserved. Sinuses/Orbits: Paranasal sinuses are aerated. Orbits are unremarkable. Other: Sella is unremarkable.  Mastoid air cells are clear. IMPRESSION: Decrease in size of lesions presumably reflecting neurocysticercosis. No evidence new metastatic disease. Electronically Signed   By: PMacy MisM.D.   On: 08/27/2021 15:52

## 2021-09-25 NOTE — Assessment & Plan Note (Addendum)
Stage IV renal cell carcinoma, Previously did not tolerate axitinib. August 2023 CT scan showed NED Labs reviewed and discussed with patient Proceed with Keytruda.  He will finish 34 cycles of Keytruda [ about 2 years's treatment] CT showed NED.  Recommend to discontinue treatment and close monitor Obtain CT in Nov 2023.

## 2021-09-25 NOTE — Assessment & Plan Note (Signed)
neurocysticercosis finished therapy. Repeat MRI brain showed decrease lesion size.

## 2021-09-26 ENCOUNTER — Other Ambulatory Visit: Payer: Self-pay

## 2021-09-29 ENCOUNTER — Other Ambulatory Visit: Payer: Self-pay

## 2021-11-18 ENCOUNTER — Ambulatory Visit
Admission: RE | Admit: 2021-11-18 | Discharge: 2021-11-18 | Disposition: A | Discharge status: Home / Self Care | Payer: Self-pay | Source: Ambulatory Visit | Attending: Oncology | Admitting: Oncology

## 2021-11-18 DIAGNOSIS — C642 Malignant neoplasm of left kidney, except renal pelvis: Secondary | ICD-10-CM | POA: Insufficient documentation

## 2021-11-21 ENCOUNTER — Encounter: Payer: Self-pay | Admitting: Oncology

## 2021-11-21 ENCOUNTER — Inpatient Hospital Stay (HOSPITAL_BASED_OUTPATIENT_CLINIC_OR_DEPARTMENT_OTHER): Payer: Self-pay | Admitting: Oncology

## 2021-11-21 ENCOUNTER — Other Ambulatory Visit: Payer: Self-pay

## 2021-11-21 ENCOUNTER — Inpatient Hospital Stay: Payer: Self-pay | Attending: Oncology

## 2021-11-21 VITALS — BP 123/85 | HR 68 | Temp 97.0°F | Resp 18 | Wt 162.1 lb

## 2021-11-21 DIAGNOSIS — C642 Malignant neoplasm of left kidney, except renal pelvis: Secondary | ICD-10-CM

## 2021-11-21 DIAGNOSIS — Z955 Presence of coronary angioplasty implant and graft: Secondary | ICD-10-CM | POA: Insufficient documentation

## 2021-11-21 DIAGNOSIS — N1832 Chronic kidney disease, stage 3b: Secondary | ICD-10-CM

## 2021-11-21 DIAGNOSIS — N401 Enlarged prostate with lower urinary tract symptoms: Secondary | ICD-10-CM | POA: Insufficient documentation

## 2021-11-21 DIAGNOSIS — D631 Anemia in chronic kidney disease: Secondary | ICD-10-CM | POA: Insufficient documentation

## 2021-11-21 DIAGNOSIS — D509 Iron deficiency anemia, unspecified: Secondary | ICD-10-CM | POA: Insufficient documentation

## 2021-11-21 DIAGNOSIS — Z7982 Long term (current) use of aspirin: Secondary | ICD-10-CM | POA: Insufficient documentation

## 2021-11-21 DIAGNOSIS — I2510 Atherosclerotic heart disease of native coronary artery without angina pectoris: Secondary | ICD-10-CM | POA: Insufficient documentation

## 2021-11-21 DIAGNOSIS — Z7902 Long term (current) use of antithrombotics/antiplatelets: Secondary | ICD-10-CM | POA: Insufficient documentation

## 2021-11-21 DIAGNOSIS — R350 Frequency of micturition: Secondary | ICD-10-CM | POA: Insufficient documentation

## 2021-11-21 DIAGNOSIS — Z905 Acquired absence of kidney: Secondary | ICD-10-CM | POA: Insufficient documentation

## 2021-11-21 DIAGNOSIS — Z79899 Other long term (current) drug therapy: Secondary | ICD-10-CM | POA: Insufficient documentation

## 2021-11-21 DIAGNOSIS — G969 Disorder of central nervous system, unspecified: Secondary | ICD-10-CM

## 2021-11-21 DIAGNOSIS — I130 Hypertensive heart and chronic kidney disease with heart failure and stage 1 through stage 4 chronic kidney disease, or unspecified chronic kidney disease: Secondary | ICD-10-CM | POA: Insufficient documentation

## 2021-11-21 LAB — CBC WITH DIFFERENTIAL/PLATELET
Abs Immature Granulocytes: 0.02 10*3/uL (ref 0.00–0.07)
Basophils Absolute: 0.1 10*3/uL (ref 0.0–0.1)
Basophils Relative: 1 %
Eosinophils Absolute: 0.7 10*3/uL — ABNORMAL HIGH (ref 0.0–0.5)
Eosinophils Relative: 8 %
HCT: 38.4 % — ABNORMAL LOW (ref 39.0–52.0)
Hemoglobin: 11.8 g/dL — ABNORMAL LOW (ref 13.0–17.0)
Immature Granulocytes: 0 %
Lymphocytes Relative: 23 %
Lymphs Abs: 2.2 10*3/uL (ref 0.7–4.0)
MCH: 24.3 pg — ABNORMAL LOW (ref 26.0–34.0)
MCHC: 30.7 g/dL (ref 30.0–36.0)
MCV: 79 fL — ABNORMAL LOW (ref 80.0–100.0)
Monocytes Absolute: 1 10*3/uL (ref 0.1–1.0)
Monocytes Relative: 10 %
Neutro Abs: 5.6 10*3/uL (ref 1.7–7.7)
Neutrophils Relative %: 58 %
Platelets: 198 10*3/uL (ref 150–400)
RBC: 4.86 MIL/uL (ref 4.22–5.81)
RDW: 15.9 % — ABNORMAL HIGH (ref 11.5–15.5)
WBC: 9.6 10*3/uL (ref 4.0–10.5)
nRBC: 0 % (ref 0.0–0.2)

## 2021-11-21 LAB — COMPREHENSIVE METABOLIC PANEL
ALT: 23 U/L (ref 0–44)
AST: 30 U/L (ref 15–41)
Albumin: 4.1 g/dL (ref 3.5–5.0)
Alkaline Phosphatase: 105 U/L (ref 38–126)
Anion gap: 7 (ref 5–15)
BUN: 44 mg/dL — ABNORMAL HIGH (ref 6–20)
CO2: 23 mmol/L (ref 22–32)
Calcium: 9.1 mg/dL (ref 8.9–10.3)
Chloride: 106 mmol/L (ref 98–111)
Creatinine, Ser: 1.92 mg/dL — ABNORMAL HIGH (ref 0.61–1.24)
GFR, Estimated: 40 mL/min — ABNORMAL LOW (ref >60)
Glucose, Bld: 91 mg/dL (ref 70–99)
Potassium: 5.2 mmol/L — ABNORMAL HIGH (ref 3.5–5.1)
Sodium: 136 mmol/L (ref 135–145)
Total Bilirubin: 0.6 mg/dL (ref 0.3–1.2)
Total Protein: 7.1 g/dL (ref 6.5–8.1)

## 2021-11-21 LAB — LACTATE DEHYDROGENASE: LDH: 148 U/L (ref 98–192)

## 2021-11-21 LAB — FERRITIN: Ferritin: 9 ng/mL — ABNORMAL LOW (ref 24–336)

## 2021-11-21 LAB — RETIC PANEL
Immature Retic Fract: 17.6 % — ABNORMAL HIGH (ref 2.3–15.9)
RBC.: 4.85 MIL/uL (ref 4.22–5.81)
Retic Count, Absolute: 54.3 10*3/uL (ref 19.0–186.0)
Retic Ct Pct: 1.1 % (ref 0.4–3.1)
Reticulocyte Hemoglobin: 25.5 pg — ABNORMAL LOW (ref >27.9)

## 2021-11-21 LAB — IRON AND TIBC
Iron: 26 ug/dL — ABNORMAL LOW (ref 45–182)
Saturation Ratios: 6 % — ABNORMAL LOW (ref 17.9–39.5)
TIBC: 414 ug/dL (ref 250–450)
UIBC: 388 ug/dL

## 2021-11-21 MED ORDER — FERROUS SULFATE 325 (65 FE) MG PO TBEC: 325.0000 mg | DELAYED_RELEASE_TABLET | Freq: Two times a day (BID) | ORAL | 3 refills | Status: DC

## 2021-11-21 NOTE — Assessment & Plan Note (Addendum)
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

## 2021-11-21 NOTE — Assessment & Plan Note (Signed)
Iron panel showed decreased iron saturation and ferritin, consistent with iron deficiency anemia.  Recommend patient to start ferrous sulfate '325mg'$  BID. Rx sent.  Etiology of IDA unknown. Refer him to GI.

## 2021-11-21 NOTE — Assessment & Plan Note (Addendum)
Stage IV renal cell carcinoma, Previously did not tolerate axitinib. S/p 34 cycles of Keytruda [ about 2 years's treatment]  Surveillance CT showed no recurrence. Lung ground glass opacity could be due to recent URI. Attention on follow up.  Continue image surveillance.

## 2021-11-21 NOTE — Addendum Note (Signed)
Addended by: Earlie Server on: 11/21/2021 10:25 PM   Modules accepted: Orders

## 2021-11-21 NOTE — Assessment & Plan Note (Signed)
avoid nephrotoxins. Encourage oral hydration

## 2021-11-21 NOTE — Progress Notes (Signed)
Pt here for follow up. No new concerns voiced.   

## 2021-11-21 NOTE — Progress Notes (Addendum)
Hematology/Oncology Progress note [phone] [phone]    Patient Care Team: Center, [facility] as PCP - General (General Practice) Earlie Server, MD as Consulting Physician (Oncology)   Name of the patient: Mark Donaldson  [id number]  1960/05/03  Date of visit: 11/21/21  ASSESSMENT & PLAN:   Cancer Staging  Renal cell carcinoma of left kidney [facility]) Staging form: Kidney, AJCC 8th Edition - Pathologic: Stage IV (pT3a, pNX, cM1) - Signed by Earlie Server, MD on 05/23/2019   Renal cell carcinoma of left kidney ([place]) Stage IV renal cell carcinoma, Previously did not tolerate axitinib. S/p 34 cycles of Keytruda [ about 2 years's treatment]  Surveillance CT showed no recurrence. Lung ground glass opacity could be due to recent URI. Attention on follow up.  Continue image surveillance.   CNS lesion 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  Stage 3b chronic kidney disease ([place]) avoid nephrotoxins. Encourage oral hydration Orders Placed This Encounter  Procedures   CT CHEST ABDOMEN PELVIS WO CONTRAST    Standing Status:   Future    Standing Expiration Date:   11/22/2022    Order Specific Question:   Preferred imaging location?    Answer:   [facility]   CBC with Differential/Platelet    Standing Status:   Future    Standing Expiration Date:   11/22/2022   Lactate dehydrogenase    Standing Status:   Future    Standing Expiration Date:   11/22/2022   Comprehensive metabolic panel    Standing Status:   Future    Standing Expiration Date:   11/21/2022   Ferritin    Standing Status:   Future    Number of Occurrences:   1    Standing Expiration Date:   11/22/2022   Retic Panel    Standing Status:   Future    Number of Occurrences:   1    Standing Expiration Date:   11/22/2022   Iron and TIBC    Standing Status:   Future    Number of Occurrences:   1    Standing Expiration Date:    11/22/2022      Patient is Spanish-speaking.   Spanish interpreter service utilized for entire encounter   Return visit  Obtain CT chest abdomen pelvis in 34month. . Follow up after CT.   All questions were answered. The patient knows to call the clinic with any problems, questions or concerns.  ZEarlie Server MD, PhD [facility]cology 11/21/2021   PERTINENT HISTORY-  61y.o. male presents for follow-up of renal cell carcinoma treatments. Oncology History  [place]  05/23/2019 Initial Diagnosis   Renal cell carcinoma of left kidney   -05/02/2019-05/07/2019 due to gross hematuria. CT showed a 7.4 cm left lower pole renal mass concerning for RCC.  Patient also has necrotic lymphadenopathy in the mediastinum and the right supra hilar region which were highly suspicious for metastatic disease.  Bilateral lung nodules, also concerning for metastatic lung disease.  Right retrocrural lymphadenopathy with upper normal left periaortic lymph node. Patient was recommended for cytoreductive radical nephrectomy.  With history of ANCA positive vasculitis, also recommend thoracic lymphadenopathy biopsy to confirm distal metastasis.  Patient agreed to radical nephrectomy and declined bronchoscopy biopsy. He underwent left radical nephrectomy on 05/05/2019. Pathology showed pT3a pNx, RCC, conventional clear cell type, grade 3    05/23/2019 Cancer Staging   Staging form: Kidney, AJCC 8th  Edition - Pathologic: Stage IV (pT3a, pNX, cM1) - Signed by Earlie Server, MD on 05/23/2019   06/07/2019 -  Chemotherapy   Keytruda + Axitinib.  06/07/19 He started Axitinib '5mg'$  BID, stopped on 07/25/2020 due to AKI, resumed on 10/04/2019, stopped again on 05/01/2020 due to diarrhea. 09/11/2020 resumed axitinib 5 mg twice daily, stopped on 10/23/2020 due to diarrhea.  01/17/2021 Keytruda was held due to transaminitis, immunotherapy induced liver toxicities. 02/20/2021, resumed Keytruda. Axitinib  continues to be held.   01/12/2020 Imaging   CT showed overall marked improvement.  Comparing to CT scan in August 2021, continues to have treatment response.Mediastinal soft tissue nodule nearly completely resolved, right minor fissure nodule 8 mm, decreased in size.  Resolution of pleural fluid and thickening of the right chest.  Small pleural effusion in the left chest.  No new lung nodules.-Subtle variation of the muscle architecture of the left psoas muscle of unknown significance   04/30/2020 Imaging   CT chest abdomen pelvis without contrast showed stable disease   11/28/2020 Imaging   CT abdomen pelvis without contrast showed stable examination.  No evidence of new or progressive disease in the chest abdomen pelvis.  Stable to minimally decreased size of 5 mm pulmonary nodule in the minor fissure in the right chest.  Sigmoid colon diverticulosis without findings of acute diverticulitis.   03/08/2021 Imaging   CT scan showed NED   07/31/2021 Imaging   CT chest abdomen pelvis showed stable disease   08/27/2021 Imaging   MRI brain with and without contrast Decrease in size of lesions presumably reflecting neurocysticercosis. No evidence new metastatic disease   08/29/2021 Imaging   CT Chest abdomen pelvis w contrast 1. No findings of active malignancy. 6 by 4 mm right upper lobe nodule along the major fissure is stable and likely represents a previously treated metastatic lesion. 2. Coronary artery atherosclerosis disproportionate to atherosclerosis elsewhere in the chest, abdomen, and pelvis. 3. Mild surgical bronchiectasis in both lower lobes. 4. Mild prostatomegaly. 5. 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 Status post left nephrectomy, without recurrent or metastatic disease. 2. Similar small right-sided pulmonary nodules. 3. New or progressive posterior left upper lobe clustered nodularity  and ground-glass, suspicious for minimal infection. 4.  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  Patient has been referred to orthopedic surgeon in [place] which he is not able to go due to transportation.  He does not have insurance as well.  He is currently asymptomatic.  Monitor.  Patient has past medical history CAD status post PCI to LAD in 2018, on aspirin and Plavix, CHF, history of focal sclerosing/crescentic GN-ANCA positive and CKD stage III. 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 He finished 14 days of albendazole plus praziquantel .   He has had few side effects including poor appetite, hair loss, weight loss, bloody stool, abdominal cramping or diarrhea. All symptoms have improved since he completed the treatment. 03/16/2020 he finished tapering course of steroids  #Intermittent orthopnea,Pre-existing diastolic CHF, 7/93/9030 LVEF 60 to 65%.   Recommend him to follow-up with cardiology-he is has not able to follow-up with cardiology due to lack of insurance coverage.  # He has frequent urination at night.  Previously  tried Cardura for BPH and had to stop due hypotension.  INTERVAL HISTORY Ondre Salvetti is a 61 y.o. male who has above history reviewed by me today presents for follow up visit for metastatic RCC Patient tolerates immunotherapy well.  He has no new complaints. Recent URI   Review of systems- Review of Systems  Constitutional:  Negative for appetite change, chills, fatigue, fever and unexpected weight change.  HENT:   Negative for voice change.   Eyes:  Negative for eye problems and icterus.  Respiratory:  Negative for chest tightness and cough.   Cardiovascular:  Negative for chest pain and leg swelling.  Gastrointestinal:  Negative for abdominal distention,  abdominal pain and diarrhea.  Endocrine: Negative for hot flashes.  Genitourinary:  Negative for difficulty urinating, dysuria, frequency and nocturia.   Musculoskeletal:  Positive for arthralgias.  Skin:  Negative for itching and rash.  Neurological:  Negative for light-headedness and numbness.  Hematological:  Negative for adenopathy. Does not bruise/bleed easily.  Psychiatric/Behavioral:  Negative for confusion.     No Known Allergies  Patient Active Problem List   Diagnosis Date Noted   Renal cell carcinoma of left kidney ([place]) 05/23/2019    Priority: High   Stage 3b chronic kidney disease ([place]) 10/04/2019    Priority: Medium    CNS lesion 07/07/2019    Priority: Medium    Encounter for antineoplastic immunotherapy 07/19/2019    Priority: Low   Encounter for antineoplastic chemotherapy 07/19/2019    Priority: Low   Goals of care, counseling/discussion 05/23/2019    Priority: Low   Avascular necrosis of bones of both hips ([place]) 06/20/2021   Transaminitis 01/17/2021   History of CHF (congestive heart failure) 05/01/2020   Hyperglycemia 02/28/2020   Brain lesion 10/04/2019   Anemia in stage 3b chronic kidney disease ([place]) 06/07/2019   Renal cell carcinoma ([place]) 05/23/2019   Palliative care encounter    History of vasculitis    Thoracic lymphadenopathy    Lung nodule    CAD S/P percutaneous coronary angioplasty 05/02/2019   Kidney mass 05/02/2019   Hydronephrosis of left kidney 05/02/2019   UTI (urinary tract infection) 05/02/2019   AKI (acute kidney injury) ([place]) 05/02/2019   Gross hematuria 05/02/2019   Chronic diastolic CHF (congestive heart failure) ([place]) 05/02/2019   Sepsis ([place]) 05/02/2019   Severe sepsis ([place]) 05/02/2019   Preop cardiovascular exam    Hyperlipidemia 05/21/2016   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 ([place]) 05/21/2016   Prediabetes 05/21/2016   Alcohol abuse 05/18/2016   Essential hypertension 05/18/2016   Vasculitis, ANCA  positive ([place]) 05/18/2016   Shortness of breath 05/17/2016     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HCC)    CAD (coronary artery disease)    Essential hypertension    HFrEF (heart failure with reduced ejection fraction) (HCC)    Hyperlipidemia LDL goal <70    Ischemic cardiomyopathy    Renal cell carcinoma ([place]) 05/23/2019   Renal disorder      Past Surgical History:  Procedure Laterality Date   CARDIAC SURGERY     cardiac cath with 2 stent placement   LAPAROSCOPIC NEPHRECTOMY, HAND ASSISTED Left 05/05/2019   Procedure: HAND ASSISTED LAPAROSCOPIC NEPHRECTOMY;  Surgeon: Hollice Espy, MD;  Location: [facility] ORS;  Service: Urology;  Laterality: Left;    Social History   Socioeconomic History   Marital status: Married    Spouse name: Not on file   Number of children: Not  on file   Years of education: Not on file   Highest education level: Not on file  Occupational History   Not on file  Tobacco Use   Smoking status: Never   Smokeless tobacco: Never  Substance and Sexual Activity   Alcohol use: Yes   Drug use: Not on file   Sexual activity: Not on file  Other Topics Concern   Not on file  Social History Narrative   Not on file   Social Determinants of Health   Financial Resource Strain: Not on file  Food Insecurity: Not on file  Transportation Needs: Not on file  Physical Activity: Not on file  Stress: Not on file  Social Connections: Not on file  Intimate Partner Violence: Not on file     Family History  Problem Relation Age of Onset   Cancer Mother    Blindness Brother      Current Outpatient Medications:    aspirin 81 MG EC tablet, Take 81 mg by mouth daily. Swallow whole., Disp: , Rfl:    atorvastatin (LIPITOR) 80 MG tablet, Take 80 mg by mouth daily., Disp: , Rfl:    clopidogrel (PLAVIX) 75 MG tablet, Take 75 mg by mouth daily., Disp: , Rfl:    furosemide (LASIX) 20 MG tablet, Take 1 tablet (20 mg total) by mouth daily.,  Disp: 30 tablet, Rfl: 3   KEYTRUDA 100 MG/4ML SOLN, INJECT '200MG'$  INTRAVENOUSLY EVERY 3 WEEKS, Disp: 8 mL, Rfl: 11   lisinopril (ZESTRIL) 10 MG tablet, Take 10 mg by mouth daily., Disp: , Rfl:    metoprolol succinate (TOPROL-XL) 100 MG 24 hr tablet, Take 1.5 tablets by mouth daily., Disp: , Rfl:    loperamide (IMODIUM) 2 MG capsule, Take 1 capsule (2 mg total) by mouth See admin instructions. Take 2 tablets with onset of diarrhea, then 1 tablet for every 2 hours until diarrhea stops. Maximum 8 tablets per 24 hours. (Patient not taking: Reported on 07/24/2021), Disp: 60 capsule, Rfl: 2   Physical exam:  Vitals:   11/21/21 1107  BP: 123/85  Pulse: 68  Resp: 18  Temp: (!) 97 F (36.1 C)  Weight: 162 lb 1.6 oz (73.5 kg)    Physical Exam Constitutional:      General: He is not in acute distress.    Appearance: He is not diaphoretic.  HENT:     Head: Normocephalic and atraumatic.     Nose: Nose normal.     Mouth/Throat:     Pharynx: No oropharyngeal exudate.  Eyes:     General: No scleral icterus.    Pupils: Pupils are equal, round, and reactive to light.  Cardiovascular:     Rate and Rhythm: Normal rate and regular rhythm.     Heart sounds: No murmur heard. Pulmonary:     Effort: Pulmonary effort is normal. No respiratory distress.     Breath sounds: No rales.  Chest:     Chest wall: No tenderness.  Abdominal:     General: There is no distension.     Palpations: Abdomen is soft.     Tenderness: There is no abdominal tenderness.  Musculoskeletal:        General: Normal range of motion.     Cervical back: Normal range of motion and neck supple.  Skin:    General: Skin is warm and dry.     Findings: No erythema.  Neurological:     Mental Status: He is alert and oriented to person, place, and time.  Cranial Nerves: No cranial nerve deficit.     Motor: No abnormal muscle tone.     Coordination: Coordination normal.  Psychiatric:        Mood and Affect: Affect normal.            Latest Ref Rng & Units 11/21/2021   10:52 AM  CMP  Glucose 70 - 99 mg/dL 91   BUN 6 - 20 mg/dL 44   Creatinine 0.61 - 1.24 mg/dL 1.92   Sodium 135 - 145 mmol/L 136   Potassium 3.5 - 5.1 mmol/L 5.2   Chloride 98 - 111 mmol/L 106   CO2 22 - 32 mmol/L 23   Calcium 8.9 - 10.3 mg/dL 9.1   Total Protein 6.5 - 8.1 g/dL 7.1   Total Bilirubin 0.3 - 1.2 mg/dL 0.6   Alkaline Phos 38 - 126 U/L 105   AST 15 - 41 U/L 30   ALT 0 - 44 U/L 23       Latest Ref Rng & Units 11/21/2021   10:52 AM  CBC  WBC 4.0 - 10.5 K/uL 9.6   Hemoglobin 13.0 - 17.0 g/dL 11.8   Hematocrit 39.0 - 52.0 % 38.4   Platelets 150 - 400 K/uL 198     RADIOGRAPHIC STUDIES: I have personally reviewed the radiological images as listed and agreed with the findings in the report. CT CHEST ABDOMEN PELVIS WO CONTRAST  Result Date: 11/19/2021 CLINICAL DATA:  History of renal cell carcinoma. Left nephrectomy. Status post chemotherapy. Asymptomatic. Coronary artery disease. Hypertension. * Tracking Code: BO * EXAM: CT CHEST, ABDOMEN AND PELVIS WITHOUT CONTRAST TECHNIQUE: Multidetector CT imaging of the chest, abdomen and pelvis was performed following the standard protocol without IV contrast. RADIATION DOSE REDUCTION: This exam was performed according to the departmental dose-optimization program which includes automated exposure control, adjustment of the mA and/or kV according to patient size and/or use of iterative reconstruction technique. COMPARISON:  08/28/2021 FINDINGS: CT CHEST FINDINGS Cardiovascular: Bovine arch. Normal caliber of the aorta and branch vessels. Normal heart size, without pericardial effusion. Three vessel coronary artery calcification. Mediastinum/Nodes: No supraclavicular adenopathy. No mediastinal or hilar adenopathy, given limitations of unenhanced CT. Lungs/Pleura: No pleural fluid. Mild bilateral lower lobe cylindrical bronchiectasis. New or increased posterior left upper lobe clustered nodularity  and mild ground-glass, including on 50/6. Inferior right upper lobe 5 mm pulmonary nodule on 79/6 is unchanged. 2 mm right middle lobe pulmonary nodule on 111/6 is similar. Musculoskeletal: No acute osseous abnormality. CT ABDOMEN PELVIS FINDINGS Hepatobiliary: Normal liver. Normal gallbladder, without biliary ductal dilatation. Pancreas: Normal, without mass or ductal dilatation. Spleen: Normal in size, without focal abnormality. Adrenals/Urinary Tract: Normal adrenal glands. Mild right renal cortical thinning. Left nephrectomy, without local recurrence. No ureteric or bladder calculi Stomach/Bowel: Marland Kitchen Normal stomach, without wall thickening. Scattered colonic diverticula. Normal terminal ileum and appendix. Normal small bowel. Vascular/Lymphatic: Normal caliber of the aorta and branch vessels. No abdominopelvic adenopathy. Reproductive: Mild prostatomegaly. Other: No significant free fluid. Tiny fat containing left inguinal hernia. Musculoskeletal: Mild left and minimal right femoral head avascular necrosis. IMPRESSION: 1. Status post left nephrectomy, without recurrent or metastatic disease. 2. Similar small right-sided pulmonary nodules. 3. New or progressive posterior left upper lobe clustered nodularity and ground-glass, suspicious for minimal infection. 4.  Coronary artery atherosclerosis. Electronically Signed   By: Abigail Miyamoto M.D.   On: 11/19/2021 09:00   CT CHEST ABDOMEN PELVIS WO CONTRAST  Result Date: 08/29/2021 CLINICAL DATA:  Left nephrectomy for renal  cell carcinoma. Restaging assessment. * Tracking Code: BO * EXAM: CT CHEST, ABDOMEN AND PELVIS WITHOUT CONTRAST TECHNIQUE: Multidetector CT imaging of the chest, abdomen and pelvis was performed following the standard protocol without IV contrast. RADIATION DOSE REDUCTION: This exam was performed according to the departmental dose-optimization program which includes automated exposure control, adjustment of the mA and/or kV according to patient size  and/or use of iterative reconstruction technique. COMPARISON:  Multiple exams, including 05/28/2021 FINDINGS: CT CHEST FINDINGS Cardiovascular: Left anterior descending, right, and circumflex coronary artery atherosclerotic disease. Mediastinum/Nodes: Unremarkable Lungs/Pleura: Right upper lobe pulmonary nodule along the major fissure, 6 by 4 mm on image 76 series 3, stable from 05/28/2021. This nodule was substantially larger back on 05/03/2019. No other significant nodules identified. Mild cylindrical bronchiectasis in the lower lobes. Musculoskeletal: Unremarkable CT ABDOMEN PELVIS FINDINGS Hepatobiliary: Unremarkable Pancreas: Unremarkable Spleen: Unremarkable Adrenals/Urinary Tract: Both adrenal glands appear normal. Left nephrectomy without recurrent mass along the nephrectomy bed. Right renal contour unremarkable. Stomach/Bowel: Unremarkable Vascular/Lymphatic: Unremarkable Reproductive: Mild prostatomegaly. Other: No supplemental non-categorized findings. Musculoskeletal: Mild findings of chronic avascular necrosis in both femoral heads without flattening or collapse. IMPRESSION: 1. No findings of active malignancy. 6 by 4 mm right upper lobe nodule along the major fissure is stable and likely represents a previously treated metastatic lesion. 2. Coronary artery atherosclerosis disproportionate to atherosclerosis elsewhere in the chest, abdomen, and pelvis. 3. Mild surgical bronchiectasis in both lower lobes. 4. Mild prostatomegaly. 5. 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 Electronically Signed   By: Van Clines M.D.   On: 08/29/2021 11:06   MR Brain W Wo Contrast  Result Date: 08/27/2021 CLINICAL DATA:  Renal cell carcinoma, history of neurocysticercosis EXAM: MRI HEAD WITHOUT AND WITH CONTRAST TECHNIQUE: Multiplanar, multiecho pulse sequences of the brain and surrounding structures were obtained without and with intravenous contrast. CONTRAST:  58m GADAVIST GADOBUTROL 1  MMOL/ML IV SOLN COMPARISON:  08/13/2020 FINDINGS: Brain: Peripherally enhancing lesions on the prior study have decreased in size. For example, right parietal lesion measures 9 mm on series 18, image 109 (previously 15 mm) and right posterior parietal lesion measures 14 mm on image 99 (previously 17 mm). Nearly resolved adjacent T2 FLAIR hyperintensity. Multiple foci of susceptibility are again identified particularly in the posterior temporal and occipital lobes and cerebellum. These probably reflects calcification rather than chronic blood products given history. There is no acute infarction.  No new mass or abnormal enhancement. Vascular: Major vessel flow voids at the skull base are preserved. Skull and upper cervical spine: Normal marrow signal is preserved. Sinuses/Orbits: Paranasal sinuses are aerated. Orbits are unremarkable. Other: Sella is unremarkable.  Mastoid air cells are clear. IMPRESSION: Decrease in size of lesions presumably reflecting neurocysticercosis. No evidence new metastatic disease. Electronically Signed   By: PMacy MisM.D.   On: 08/27/2021 15:52

## 2021-11-22 ENCOUNTER — Other Ambulatory Visit: Payer: Self-pay

## 2021-11-22 ENCOUNTER — Telehealth: Payer: Self-pay

## 2021-11-22 DIAGNOSIS — Z1211 Encounter for screening for malignant neoplasm of colon: Secondary | ICD-10-CM

## 2021-11-22 NOTE — Telephone Encounter (Signed)
-----   Message from Earlie Server, MD sent at 11/21/2021 10:24 PM EST ----- His iron level is low. Recommend him to take oral ferrous sulfate '325mg'$  BID. Rx sent to [facility].  He has not had colonoscopy. Please refer to GI for colonoscopy. Thanks.

## 2021-11-22 NOTE — Telephone Encounter (Signed)
Pt informed of MD recommendation and verbalized understanding. Referral placed to GI.   Pt states that he has received CT appt.

## 2021-12-02 ENCOUNTER — Other Ambulatory Visit: Payer: Self-pay

## 2022-03-10 ENCOUNTER — Other Ambulatory Visit
Admission: RE | Admit: 2022-03-10 | Discharge: 2022-03-10 | Disposition: A | Discharge status: Home / Self Care | Payer: Self-pay | Source: Ambulatory Visit | Attending: Nephrology | Admitting: Nephrology

## 2022-03-10 DIAGNOSIS — N1832 Chronic kidney disease, stage 3b: Secondary | ICD-10-CM | POA: Insufficient documentation

## 2022-03-10 LAB — RENAL FUNCTION PANEL
Albumin: 4.2 g/dL (ref 3.5–5.0)
Anion gap: 8 (ref 5–15)
BUN: 26 mg/dL — ABNORMAL HIGH (ref 6–20)
CO2: 24 mmol/L (ref 22–32)
Calcium: 8.8 mg/dL — ABNORMAL LOW (ref 8.9–10.3)
Chloride: 104 mmol/L (ref 98–111)
Creatinine, Ser: 1.74 mg/dL — ABNORMAL HIGH (ref 0.61–1.24)
GFR, Estimated: 44 mL/min — ABNORMAL LOW (ref >60)
Glucose, Bld: 99 mg/dL (ref 70–99)
Phosphorus: 3.5 mg/dL (ref 2.5–4.6)
Potassium: 4.2 mmol/L (ref 3.5–5.1)
Sodium: 136 mmol/L (ref 135–145)

## 2022-03-10 LAB — CBC WITH DIFFERENTIAL/PLATELET
Abs Immature Granulocytes: 0.04 10*3/uL (ref 0.00–0.07)
Basophils Absolute: 0.1 10*3/uL (ref 0.0–0.1)
Basophils Relative: 1 %
Eosinophils Absolute: 0.6 10*3/uL — ABNORMAL HIGH (ref 0.0–0.5)
Eosinophils Relative: 6 %
HCT: 41.8 % (ref 39.0–52.0)
Hemoglobin: 13.1 g/dL (ref 13.0–17.0)
Immature Granulocytes: 0 %
Lymphocytes Relative: 23 %
Lymphs Abs: 2.4 10*3/uL (ref 0.7–4.0)
MCH: 25.6 pg — ABNORMAL LOW (ref 26.0–34.0)
MCHC: 31.3 g/dL (ref 30.0–36.0)
MCV: 81.6 fL (ref 80.0–100.0)
Monocytes Absolute: 0.9 10*3/uL (ref 0.1–1.0)
Monocytes Relative: 9 %
Neutro Abs: 6.5 10*3/uL (ref 1.7–7.7)
Neutrophils Relative %: 61 %
Platelets: 176 10*3/uL (ref 150–400)
RBC: 5.12 MIL/uL (ref 4.22–5.81)
RDW: 16.4 % — ABNORMAL HIGH (ref 11.5–15.5)
WBC: 10.5 10*3/uL (ref 4.0–10.5)
nRBC: 0 % (ref 0.0–0.2)

## 2022-03-11 LAB — MICROALBUMIN / CREATININE URINE RATIO
Creatinine, Urine: 44.4 mg/dL
Microalb Creat Ratio: <7 mg/g creat (ref 0–29)
Microalb, Ur: <3 ug/mL — ABNORMAL HIGH

## 2022-03-11 LAB — PARATHYROID HORMONE, INTACT (NO CA): PTH: 48 pg/mL (ref 15–65)

## 2022-03-19 ENCOUNTER — Ambulatory Visit
Admission: RE | Admit: 2022-03-19 | Discharge: 2022-03-19 | Disposition: A | Discharge status: Home / Self Care | Payer: Self-pay | Source: Ambulatory Visit | Attending: Oncology | Admitting: Oncology

## 2022-03-19 DIAGNOSIS — C642 Malignant neoplasm of left kidney, except renal pelvis: Secondary | ICD-10-CM | POA: Insufficient documentation

## 2022-03-25 ENCOUNTER — Inpatient Hospital Stay: Payer: Self-pay | Attending: Oncology

## 2022-03-25 ENCOUNTER — Encounter: Payer: Self-pay | Admitting: Oncology

## 2022-03-25 ENCOUNTER — Inpatient Hospital Stay (HOSPITAL_BASED_OUTPATIENT_CLINIC_OR_DEPARTMENT_OTHER): Payer: Self-pay | Admitting: Oncology

## 2022-03-25 VITALS — BP 109/79 | HR 75 | Temp 98.2°F | Resp 18 | Wt 159.0 lb

## 2022-03-25 DIAGNOSIS — Z9221 Personal history of antineoplastic chemotherapy: Secondary | ICD-10-CM | POA: Insufficient documentation

## 2022-03-25 DIAGNOSIS — D508 Other iron deficiency anemias: Secondary | ICD-10-CM

## 2022-03-25 DIAGNOSIS — R918 Other nonspecific abnormal finding of lung field: Secondary | ICD-10-CM | POA: Insufficient documentation

## 2022-03-25 DIAGNOSIS — N1832 Chronic kidney disease, stage 3b: Secondary | ICD-10-CM | POA: Insufficient documentation

## 2022-03-25 DIAGNOSIS — C642 Malignant neoplasm of left kidney, except renal pelvis: Secondary | ICD-10-CM

## 2022-03-25 DIAGNOSIS — R911 Solitary pulmonary nodule: Secondary | ICD-10-CM

## 2022-03-25 DIAGNOSIS — Z85528 Personal history of other malignant neoplasm of kidney: Secondary | ICD-10-CM | POA: Insufficient documentation

## 2022-03-25 DIAGNOSIS — D509 Iron deficiency anemia, unspecified: Secondary | ICD-10-CM | POA: Insufficient documentation

## 2022-03-25 LAB — CBC WITH DIFFERENTIAL/PLATELET
Abs Immature Granulocytes: 0.04 10*3/uL (ref 0.00–0.07)
Basophils Absolute: 0.1 10*3/uL (ref 0.0–0.1)
Basophils Relative: 1 %
Eosinophils Absolute: 0.8 10*3/uL — ABNORMAL HIGH (ref 0.0–0.5)
Eosinophils Relative: 8 %
HCT: 41.5 % (ref 39.0–52.0)
Hemoglobin: 13.1 g/dL (ref 13.0–17.0)
Immature Granulocytes: 0 %
Lymphocytes Relative: 16 %
Lymphs Abs: 1.7 10*3/uL (ref 0.7–4.0)
MCH: 25.8 pg — ABNORMAL LOW (ref 26.0–34.0)
MCHC: 31.6 g/dL (ref 30.0–36.0)
MCV: 81.9 fL (ref 80.0–100.0)
Monocytes Absolute: 0.9 10*3/uL (ref 0.1–1.0)
Monocytes Relative: 9 %
Neutro Abs: 6.9 10*3/uL (ref 1.7–7.7)
Neutrophils Relative %: 66 %
Platelets: 172 10*3/uL (ref 150–400)
RBC: 5.07 MIL/uL (ref 4.22–5.81)
RDW: 15.8 % — ABNORMAL HIGH (ref 11.5–15.5)
WBC: 10.4 10*3/uL (ref 4.0–10.5)
nRBC: 0 % (ref 0.0–0.2)

## 2022-03-25 LAB — COMPREHENSIVE METABOLIC PANEL
ALT: 21 U/L (ref 0–44)
AST: 30 U/L (ref 15–41)
Albumin: 4.1 g/dL (ref 3.5–5.0)
Alkaline Phosphatase: 97 U/L (ref 38–126)
Anion gap: 8 (ref 5–15)
BUN: 40 mg/dL — ABNORMAL HIGH (ref 6–20)
CO2: 24 mmol/L (ref 22–32)
Calcium: 9.2 mg/dL (ref 8.9–10.3)
Chloride: 107 mmol/L (ref 98–111)
Creatinine, Ser: 1.9 mg/dL — ABNORMAL HIGH (ref 0.61–1.24)
GFR, Estimated: 40 mL/min — ABNORMAL LOW (ref >60)
Glucose, Bld: 115 mg/dL — ABNORMAL HIGH (ref 70–99)
Potassium: 4.4 mmol/L (ref 3.5–5.1)
Sodium: 139 mmol/L (ref 135–145)
Total Bilirubin: 0.7 mg/dL (ref 0.3–1.2)
Total Protein: 7.1 g/dL (ref 6.5–8.1)

## 2022-03-25 LAB — IRON AND TIBC
Iron: 50 ug/dL (ref 45–182)
Saturation Ratios: 12 % — ABNORMAL LOW (ref 17.9–39.5)
TIBC: 410 ug/dL (ref 250–450)
UIBC: 360 ug/dL

## 2022-03-25 LAB — LACTATE DEHYDROGENASE: LDH: 135 U/L (ref 98–192)

## 2022-03-25 LAB — FERRITIN: Ferritin: 14 ng/mL — ABNORMAL LOW (ref 24–336)

## 2022-03-25 MED ORDER — FERROUS SULFATE 325 (65 FE) MG PO TBEC: 325.0000 mg | DELAYED_RELEASE_TABLET | Freq: Two times a day (BID) | ORAL | 1 refills | Status: DC

## 2022-03-25 NOTE — Progress Notes (Signed)
Hematology/Oncology Progress note [phone] [phone]    REASON OF VISIT  renal cell carcinoma   ASSESSMENT & PLAN:   Cancer Staging  Renal cell carcinoma of left kidney ([place]) Staging form: Kidney, AJCC 8th Edition - Pathologic: Stage IV (pT3a, pNX, cM1) - Signed by Earlie Server, MD on 05/23/2019   Renal cell carcinoma of left kidney ([place]) Stage IV renal cell carcinoma, Previously did not tolerate axitinib. S/p 34 cycles of Keytruda [ about 2 years's treatment]  Surveillance CT showed no obvious signs of recurrence. Lung ground glass opacity/nodularity persistent? Infection/inflammation.  Attention on follow up. Refer to pulmonology for evaluation.  Continue image surveillance.   Stage 3b chronic kidney disease ([place]) avoid nephrotoxins. Encourage oral hydration  IDA (iron deficiency anemia) Iron panel showed decreased iron saturation and ferritin, consistent with iron deficiency anemia.  Recommend patient to start ferrous sulfate '325mg'$  BID. Rx sent.  Etiology of IDA unknown. Refer him to GI.   Orders Placed This Encounter  Procedures   CT CHEST ABDOMEN PELVIS WO CONTRAST    Standing Status:   Future    Standing Expiration Date:   03/25/2023    Order Specific Question:   If indicated for the ordered procedure, I authorize the administration of contrast media per Radiology protocol    Answer:   Yes    Order Specific Question:   Preferred imaging location?    Answer:   [facility]    Order Specific Question:   Is Oral Contrast requested for this exam?    Answer:   Yes, Per Radiology protocol   CBC with Differential ([place] Only)    Standing Status:   Future    Standing Expiration Date:   03/25/2023   CMP ([place] only)    Standing Status:   Future    Standing Expiration Date:   03/25/2023   Lactate dehydrogenase    Standing Status:   Future    Standing Expiration Date:   03/25/2023   Ferritin    Standing Status:   Future    Standing  Expiration Date:   03/25/2023   Iron and TIBC    Standing Status:   Future    Standing Expiration Date:   03/25/2023   Ambulatory referral to Pulmonology    Referral Priority:   Routine    Referral Type:   Consultation    Referral Reason:   Specialty Services Required    Referred to Provider:   Tyler Pita, MD    Requested Specialty:   Pulmonary Disease    Number of Visits Requested:   1      Patient is Spanish-speaking.   Spanish interpreter service utilized for entire encounter   Return visit  Obtain CT chest abdomen pelvis in 6 months. . Follow up after CT.   All questions were answered. The patient knows to call the clinic with any problems, questions or concerns.  Earlie Server, MD, PhD [facility] Hematology Oncology 03/25/2022   PERTINENT HISTORY-  62 y.o. male presents for follow-up of renal cell carcinoma  Oncology History  Renal cell carcinoma of left kidney ([place])  05/23/2019 Initial Diagnosis   Renal cell carcinoma of left kidney   -05/02/2019-05/07/2019 due to gross hematuria. CT showed a 7.4 cm left lower pole renal mass concerning for RCC.  Patient also has necrotic lymphadenopathy in the mediastinum and the right supra hilar region which were highly suspicious for metastatic disease.  Bilateral lung nodules, also concerning for metastatic  lung disease.  Right retrocrural lymphadenopathy with upper normal left periaortic lymph node. Patient was recommended for cytoreductive radical nephrectomy.  With history of ANCA positive vasculitis, also recommend thoracic lymphadenopathy biopsy to confirm distal metastasis.  Patient agreed to radical nephrectomy and declined bronchoscopy biopsy. He underwent left radical nephrectomy on 05/05/2019. Pathology showed pT3a pNx, RCC, conventional clear cell type, grade 3    05/23/2019 Cancer Staging   Staging form: Kidney, AJCC 8th Edition - Pathologic: Stage IV (pT3a, pNX, cM1) - Signed by Earlie Server, MD on 05/23/2019   06/07/2019 -   Chemotherapy   Keytruda + Axitinib.  06/07/19 He started Axitinib '5mg'$  BID, stopped on 07/25/2020 due to AKI, resumed on 10/04/2019, stopped again on 05/01/2020 due to diarrhea. 09/11/2020 resumed axitinib 5 mg twice daily, stopped on 10/23/2020 due to diarrhea.  01/17/2021 Keytruda was held due to transaminitis, immunotherapy induced liver toxicities. 02/20/2021, resumed Keytruda. Axitinib continues to be held.   01/12/2020 Imaging   CT showed overall marked improvement.  Comparing to CT scan in August 2021, continues to have treatment response.Mediastinal soft tissue nodule nearly completely resolved, right minor fissure nodule 8 mm, decreased in size.  Resolution of pleural fluid and thickening of the right chest.  Small pleural effusion in the left chest.  No new lung nodules.-Subtle variation of the muscle architecture of the left psoas muscle of unknown significance   04/30/2020 Imaging   CT chest abdomen pelvis without contrast showed stable disease   11/28/2020 Imaging   CT abdomen pelvis without contrast showed stable examination.  No evidence of new or progressive disease in the chest abdomen pelvis.  Stable to minimally decreased size of 5 mm pulmonary nodule in the minor fissure in the right chest.  Sigmoid colon diverticulosis without findings of acute diverticulitis.   03/08/2021 Imaging   CT scan showed NED   07/31/2021 Imaging   CT chest abdomen pelvis showed stable disease   08/27/2021 Imaging   MRI brain with and without contrast Decrease in size of lesions presumably reflecting neurocysticercosis. No evidence new metastatic disease   08/29/2021 Imaging   CT Chest abdomen pelvis w contrast 1. No findings of active malignancy. 6 by 4 mm right upper lobe nodule along the major fissure is stable and likely represents a previously treated metastatic lesion. 2. Coronary artery atherosclerosis disproportionate to atherosclerosis elsewhere in the chest, abdomen, and pelvis. 3. Mild  surgical bronchiectasis in both lower lobes. 4. Mild prostatomegaly. 5. 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 Status post left nephrectomy, without recurrent or metastatic disease. 2. Similar small right-sided pulmonary nodules. 3. New or progressive posterior left upper lobe clustered nodularity and ground-glass, suspicious for minimal infection. 4.  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  Patient has been referred to orthopedic surgeon in [place] which he is not able to go due to transportation.  He does not have insurance as well.  He is currently asymptomatic.  Monitor.  Patient has past medical history CAD status post PCI to LAD in 2018, on aspirin and Plavix, CHF, history of focal sclerosing/crescentic GN-ANCA positive and CKD stage III. 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 He finished 14 days of albendazole plus praziquantel .  He has had few side effects including poor appetite, hair loss, weight loss, bloody stool, abdominal cramping or diarrhea. All symptoms have improved since he completed the treatment. 03/16/2020 he finished tapering course of steroids  #Intermittent orthopnea,Pre-existing diastolic CHF, 0000000 LVEF 60 to 65%.   Recommend him to follow-up with cardiology-he is has not able to follow-up with cardiology due to lack of insurance coverage.  # He has frequent urination at night.  Previously tried Cardura for BPH and had to stop due hypotension.  INTERVAL HISTORY Mark Donaldson is a 62 y.o. male who has above history reviewed by me today presents for follow up visit for metastatic RCC Patient reports feeling well.  He is not taking oral iron supplementation.  Denies rectal  bleeding.    Review of systems- Review of Systems  Constitutional:  Negative for appetite change, chills, fatigue, fever and unexpected weight change.  HENT:   Negative for voice change.   Eyes:  Negative for eye problems and icterus.  Respiratory:  Negative for chest tightness and cough.   Cardiovascular:  Negative for chest pain and leg swelling.  Gastrointestinal:  Negative for abdominal distention, abdominal pain and diarrhea.  Endocrine: Negative for hot flashes.  Genitourinary:  Negative for difficulty urinating, dysuria, frequency and nocturia.   Musculoskeletal:  Positive for arthralgias.  Skin:  Negative for itching and rash.  Neurological:  Negative for light-headedness and numbness.  Hematological:  Negative for adenopathy. Does not bruise/bleed easily.  Psychiatric/Behavioral:  Negative for confusion.     No Known Allergies  Patient Active Problem List   Diagnosis Date Noted   Renal cell carcinoma of left kidney ([place]) 05/23/2019    Priority: High   IDA (iron deficiency anemia) 11/21/2021    Priority: Medium    Stage 3b chronic kidney disease ([place]) 10/04/2019    Priority: Medium    CNS lesion 07/07/2019    Priority: Medium    Encounter for antineoplastic immunotherapy 07/19/2019    Priority: Low   Encounter for antineoplastic chemotherapy 07/19/2019    Priority: Low   Goals of care, counseling/discussion 05/23/2019    Priority: Low   Avascular necrosis of bones of both hips ([place]) 06/20/2021   Transaminitis 01/17/2021   History of CHF (congestive heart failure) 05/01/2020   Hyperglycemia 02/28/2020   Brain lesion 10/04/2019   Anemia in stage 3b chronic kidney disease ([place]) 06/07/2019   Renal cell carcinoma ([place]) 05/23/2019   Palliative care encounter    History of vasculitis    Thoracic lymphadenopathy    Lung nodule    CAD S/P percutaneous coronary angioplasty 05/02/2019   Kidney mass 05/02/2019   Hydronephrosis of left kidney 05/02/2019   UTI (urinary  tract infection) 05/02/2019   AKI (acute kidney injury) ([place]) 05/02/2019   Gross hematuria 05/02/2019   Chronic diastolic CHF (congestive heart failure) ([place]) 05/02/2019   Sepsis ([place]) 05/02/2019   Severe sepsis ([place]) 05/02/2019   Preop cardiovascular exam    Hyperlipidemia 05/21/2016   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 ([place]) 05/21/2016   Prediabetes 05/21/2016   Alcohol abuse 05/18/2016   Essential hypertension 05/18/2016   Vasculitis, 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[place]) 05/18/2016   Shortness of breath 05/17/2016     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HCC)    CAD (coronary artery disease)    Essential hypertension    HFrEF (heart failure with reduced ejection fraction) (HCC)    Hyperlipidemia LDL goal <70    Ischemic cardiomyopathy  Renal cell carcinoma ([place]) 05/23/2019   Renal disorder      Past Surgical History:  Procedure Laterality Date   CARDIAC SURGERY     cardiac cath with 2 stent placement   LAPAROSCOPIC NEPHRECTOMY, HAND ASSISTED Left 05/05/2019   Procedure: HAND ASSISTED LAPAROSCOPIC NEPHRECTOMY;  Surgeon: Hollice Espy, MD;  Location: [facility] ORS;  Service: Urology;  Laterality: Left;    Social History   Socioeconomic History   Marital status: Married    Spouse name: Not on file   Number of children: Not on file   Years of education: Not on file   Highest education level: Not on file  Occupational History   Not on file  Tobacco Use   Smoking status: Never   Smokeless tobacco: Never  Substance and Sexual Activity   Alcohol use: Yes   Drug use: Not on file   Sexual activity: Not on file  Other Topics Concern   Not on file  Social History Narrative   Not on file   Social Determinants of Health   Financial Resource Strain: Not on file  Food Insecurity: Not on file  Transportation Needs: Not on file  Physical Activity: Not on file  Stress: Not on file  Social Connections: Not on file  Intimate Partner Violence:  Not on file     Family History  Problem Relation Age of Onset   Cancer Mother    Blindness Brother      Current Outpatient Medications:    aspirin 81 MG EC tablet, Take 81 mg by mouth daily. Swallow whole., Disp: , Rfl:    atorvastatin (LIPITOR) 80 MG tablet, Take 80 mg by mouth daily., Disp: , Rfl:    clopidogrel (PLAVIX) 75 MG tablet, Take 75 mg by mouth daily., Disp: , Rfl:    furosemide (LASIX) 20 MG tablet, Take 1 tablet (20 mg total) by mouth daily., Disp: 30 tablet, Rfl: 3   metoprolol succinate (TOPROL-XL) 100 MG 24 hr tablet, Take 1.5 tablets by mouth daily., Disp: , Rfl:    KEYTRUDA 100 MG/4ML SOLN, INJECT '200MG'$  INTRAVENOUSLY EVERY 3 WEEKS (Patient not taking: Reported on 03/25/2022), Disp: 8 mL, Rfl: 11   Physical exam:  Vitals:   03/25/22 1355  BP: 109/79  Pulse: 75  Resp: 18  Temp: 98.2 F (36.8 C)  Weight: 159 lb (72.1 kg)    Physical Exam Constitutional:      General: He is not in acute distress.    Appearance: He is not diaphoretic.  HENT:     Head: Normocephalic and atraumatic.     Nose: Nose normal.     Mouth/Throat:     Pharynx: No oropharyngeal exudate.  Eyes:     General: No scleral icterus.    Pupils: Pupils are equal, round, and reactive to light.  Cardiovascular:     Rate and Rhythm: Normal rate and regular rhythm.     Heart sounds: No murmur heard. Pulmonary:     Effort: Pulmonary effort is normal. No respiratory distress.     Breath sounds: No rales.  Chest:     Chest wall: No tenderness.  Abdominal:     General: There is no distension.     Palpations: Abdomen is soft.     Tenderness: There is no abdominal tenderness.  Musculoskeletal:        General: Normal range of motion.     Cervical back: Normal range of motion and neck supple.  Skin:    General: Skin is warm  and dry.     Findings: No erythema.  Neurological:     Mental Status: He is alert and oriented to person, place, and time.     Cranial Nerves: No cranial nerve deficit.      Motor: No abnormal muscle tone.     Coordination: Coordination normal.  Psychiatric:        Mood and Affect: Affect normal.       Labs    Latest Ref Rng & Units 03/25/2022    1:18 PM 03/10/2022    1:20 PM 11/21/2021   10:52 AM  CBC  WBC 4.0 - 10.5 K/uL 10.4  10.5  9.6   Hemoglobin 13.0 - 17.0 g/dL 13.1  13.1  11.8   Hematocrit 39.0 - 52.0 % 41.5  41.8  38.4   Platelets 150 - 400 K/uL 172  176  198       Latest Ref Rng & Units 03/25/2022    1:18 PM 03/10/2022    1:23 PM 11/21/2021   10:52 AM  CMP  Glucose 70 - 99 mg/dL 115  99  91   BUN 6 - 20 mg/dL 40  26  44   Creatinine 0.61 - 1.24 mg/dL 1.90  1.74  1.92   Sodium 135 - 145 mmol/L 139  136  136   Potassium 3.5 - 5.1 mmol/L 4.4  4.2  5.2   Chloride 98 - 111 mmol/L 107  104  106   CO2 22 - 32 mmol/L '24  24  23   '$ Calcium 8.9 - 10.3 mg/dL 9.2  8.8  9.1   Total Protein 6.5 - 8.1 g/dL 7.1   7.1   Total Bilirubin 0.3 - 1.2 mg/dL 0.7   0.6   Alkaline Phos 38 - 126 U/L 97   105   AST 15 - 41 U/L 30   30   ALT 0 - 44 U/L 21   23       RADIOGRAPHIC STUDIES: I have personally reviewed the radiological images as listed and agreed with the findings in the report. CT CHEST ABDOMEN PELVIS WO CONTRAST  Result Date: 03/19/2022 CLINICAL DATA:  History of renal cell carcinoma status post left nephrectomy and chemotherapy. * Tracking Code: BO * EXAM: CT CHEST, ABDOMEN AND PELVIS WITHOUT CONTRAST TECHNIQUE: Multidetector CT imaging of the chest, abdomen and pelvis was performed following the standard protocol without IV contrast. RADIATION DOSE REDUCTION: This exam was performed according to the departmental dose-optimization program which includes automated exposure control, adjustment of the mA and/or kV according to patient size and/or use of iterative reconstruction technique. COMPARISON:  Multiple priors including most recent CT November 18, 2021. FINDINGS: CT CHEST FINDINGS Cardiovascular: Normal caliber thoracic aorta. Coronary artery  calcifications. Normal size heart. No significant pericardial effusion/thickening. Mediastinum/Nodes: No suspicious thyroid nodule. No pathologically enlarged mediastinal, hilar or axillary lymph nodes, noting limited sensitivity for the detection of hilar adenopathy on this noncontrast study. The esophagus is grossly unremarkable. Lungs/Pleura: Increased clustered nodularity in the posterior left upper lobe for instance on image 52/4. Inferior right upper lobe pulmonary nodule measures 5 mm on image 78/4, unchanged. Right middle lobe pulmonary nodule measures 2 mm on image 109/4, unchanged Musculoskeletal: No aggressive lytic or blastic lesion of bone CT ABDOMEN PELVIS FINDINGS Hepatobiliary: No suspicious hepatic lesion on noncontrast enhanced examination. Gallbladder is unremarkable. No biliary ductal dilation. Pancreas: No pancreatic ductal dilation or evidence of acute inflammation. Spleen: No splenomegaly. Adrenals/Urinary Tract: Bilateral adrenal glands appear normal. Left  kidney surgically absent without new suspicious soft tissue nodularity in the nephrectomy bed. No right-sided hydronephrosis or contour deforming renal mass. Urinary bladder is unremarkable for degree of distension. Stomach/Bowel: No radiopaque enteric contrast material was administered. Stomach is unremarkable for degree of distension. No pathologic dilation of small or large bowel. Normal appendix. Sigmoid colonic diverticulosis without findings of acute diverticulitis Vascular/Lymphatic: Normal caliber abdominal aorta. Smooth IVC contours. No pathologically enlarged abdominal or pelvic lymph nodes Reproductive: Prostate is unremarkable. Other: No significant abdominopelvic free fluid. Musculoskeletal: No aggressive lytic or blastic lesion of bone. Similar minimal avascular necrosis of the bilateral femoral heads. IMPRESSION: 1. Status post left nephrectomy without evidence of local recurrence or new/progressive disease within the chest,  abdomen or pelvis on this noncontrast enhanced examination. 2. Increased clustered nodularity in the posterior left upper lobe, most likely reflecting an infectious or inflammatory etiology. 3. Stable right-sided pulmonary nodules measuring up to 5 mm. 4. Sigmoid colonic diverticulosis without findings of acute diverticulitis. Electronically Signed   By: Dahlia Bailiff M.D.   On: 03/19/2022 15:14

## 2022-03-25 NOTE — Addendum Note (Signed)
Addended by: Earlie Server on: 03/25/2022 08:44 PM   Modules accepted: Orders

## 2022-03-25 NOTE — Assessment & Plan Note (Signed)
avoid nephrotoxins. Encourage oral hydration 

## 2022-03-25 NOTE — Assessment & Plan Note (Signed)
Iron panel showed decreased iron saturation and ferritin, consistent with iron deficiency anemia.  Recommend patient to start ferrous sulfate 325mg BID. Rx sent.  Etiology of IDA unknown. Refer him to GI.  

## 2022-03-25 NOTE — Assessment & Plan Note (Addendum)
Stage IV renal cell carcinoma, Previously did not tolerate axitinib. S/p 34 cycles of Keytruda [ about 2 years's treatment]  Surveillance CT showed no obvious signs of recurrence. Lung ground glass opacity/nodularity persistent? Infection/inflammation.  Attention on follow up. Refer to pulmonology for evaluation.  Continue image surveillance.

## 2022-03-26 ENCOUNTER — Telehealth: Payer: Self-pay

## 2022-03-26 DIAGNOSIS — D508 Other iron deficiency anemias: Secondary | ICD-10-CM

## 2022-03-26 NOTE — Telephone Encounter (Signed)
Called and spoke to pt, informed him of rx and ref to GI.   Referral placed in EPIC

## 2022-03-26 NOTE — Telephone Encounter (Signed)
-----   Message from Earlie Server, MD sent at 03/25/2022  8:45 PM EDT ----- Iron level is still low, recommend pt to take oral iron supplementation. Rx sent to pharmacy. Please let him know thanks.  Refer to [facility].  We referred him last time and he did not establish care.

## 2022-03-31 ENCOUNTER — Encounter: Payer: Self-pay | Admitting: Oncology

## 2022-04-11 ENCOUNTER — Encounter: Payer: Self-pay | Admitting: Oncology

## 2022-04-15 ENCOUNTER — Other Ambulatory Visit: Payer: Self-pay

## 2022-05-20 ENCOUNTER — Encounter: Payer: Self-pay | Admitting: Pulmonary Disease

## 2022-05-20 ENCOUNTER — Ambulatory Visit (INDEPENDENT_AMBULATORY_CARE_PROVIDER_SITE_OTHER): Payer: Self-pay | Admitting: Pulmonary Disease

## 2022-05-20 ENCOUNTER — Other Ambulatory Visit
Admission: RE | Admit: 2022-05-20 | Discharge: 2022-05-20 | Disposition: A | Discharge status: Home / Self Care | Payer: Self-pay | Source: Ambulatory Visit | Attending: Pulmonary Disease | Admitting: Pulmonary Disease

## 2022-05-20 VITALS — BP 112/80 | HR 64 | Temp 98.0°F | Ht 65.5 in | Wt 160.2 lb

## 2022-05-20 DIAGNOSIS — D721 Eosinophilia, unspecified: Secondary | ICD-10-CM

## 2022-05-20 DIAGNOSIS — C642 Malignant neoplasm of left kidney, except renal pelvis: Secondary | ICD-10-CM

## 2022-05-20 DIAGNOSIS — R918 Other nonspecific abnormal finding of lung field: Secondary | ICD-10-CM

## 2022-05-20 DIAGNOSIS — R0602 Shortness of breath: Secondary | ICD-10-CM

## 2022-05-20 DIAGNOSIS — R059 Cough, unspecified: Secondary | ICD-10-CM

## 2022-05-20 DIAGNOSIS — I7782 Antineutrophilic cytoplasmic antibody (ANCA) vasculitis: Secondary | ICD-10-CM

## 2022-05-20 LAB — NITRIC OXIDE: Nitric Oxide: 31

## 2022-05-20 MED ORDER — TRELEGY ELLIPTA 200-62.5-25 MCG/ACT IN AEPB: 1.0000 | INHALATION_SPRAY | Freq: Every day | RESPIRATORY_TRACT | 0 refills | Status: DC

## 2022-05-20 MED ORDER — FLUTICASONE FUROATE-VILANTEROL 100-25 MCG/ACT IN AEPB: 1.0000 | INHALATION_SPRAY | Freq: Every day | RESPIRATORY_TRACT | 4 refills | Status: DC

## 2022-05-20 NOTE — Patient Instructions (Addendum)
You show some evidence of allergies and your blood sample.  We are going to get some more blood work to sort this out.  We are ordering breathing test as well tell us about your cough.  We are giving you a trial of an inhaler called Breo Ellipta this is 1 puff daily.  We showed you how to use it during that visit.  This has been sent to your pharmacy at the [facility].  We will continue to follow the findings in your CT of the chest.  For now there is nothing else to do.  We will see you in follow-up in 2 to 3 months time call sooner should any new problems arise.    Demuestra alguna evidencia de Environmental consultant en su [facility] [place]. Vamos a hacer ms anlisis de sangre para investigar esto.  Estamos solicitando una prueba de respiracin para estudiar sus pulmones.  Le ofrecemos una prueba de un inhalador llamado Breo Ellipta: 1 inhalacin diaria. Le mostramos cmo usarlo durante esa visita. Esto ha sido enviado a su farmacia en la Water quality scientist.  Continuaremos siguiendo los hallazgos en su tomografa computarizada del trax. Por ahora no hay nada ms que hacer.  Nos veremos en el seguimiento dentro de 2 a 3 meses y llamaremos antes si surge algn problema nuevo.

## 2022-05-20 NOTE — Progress Notes (Signed)
Subjective:    Patient ID: Mark Donaldson, male    DOB: 1960-07-08, 62 y.o.   MRN: [id number] Patient Care Team: Center, [facility] as PCP - General (General Practice) [name], MD as Consulting Physician (Oncology)  Chief Complaint  Patient presents with  . Consult    Nodule. CT on 03/19/2022. No SOB or wheezing. Cough     HPI    Review of Systems A 10 point review of systems was performed and it is as noted above otherwise negative.  Past Medical History:  Diagnosis Date  . ANCA-positive vasculitis (HCC)   . CAD (coronary artery disease)   . Essential hypertension   . HFrEF (heart failure with reduced ejection fraction) (HCC)   . Hyperlipidemia LDL goal <70   . Ischemic cardiomyopathy   . Renal cell carcinoma (HCC) 05/23/2019  . Renal disorder    Past Surgical History:  Procedure Laterality Date  . CARDIAC SURGERY     cardiac cath with 2 stent placement  . LAPAROSCOPIC NEPHRECTOMY, HAND ASSISTED Left 05/05/2019   Procedure: HAND ASSISTED LAPAROSCOPIC NEPHRECTOMY;  Surgeon: Vanna Scotland, MD;  Location: [facility] ORS;  Service: Urology;  Laterality: Left;   Patient Active Problem List   Diagnosis Date Noted  . IDA (iron deficiency anemia) 11/21/2021  . Avascular necrosis of bones of both hips (HCC) 06/20/2021  . Transaminitis 01/17/2021  . History of CHF (congestive heart failure) 05/01/2020  . Hyperglycemia 02/28/2020  . Stage 3b chronic kidney disease (HCC) 10/04/2019  . Brain lesion 10/04/2019  . Encounter for antineoplastic immunotherapy 07/19/2019  . Encounter for antineoplastic chemotherapy 07/19/2019  . CNS lesion 07/07/2019  . Anemia in stage 3b chronic kidney disease (HCC) 06/07/2019  . Goals of care, counseling/discussion 05/23/2019  . Renal cell carcinoma of left kidney (HCC) 05/23/2019  . Renal cell carcinoma (HCC) 05/23/2019  . Palliative care encounter   . History of vasculitis   . Thoracic lymphadenopathy   . Lung nodule   .  CAD S/P percutaneous coronary angioplasty 05/02/2019  . Kidney mass 05/02/2019  . Hydronephrosis of left kidney 05/02/2019  . UTI (urinary tract infection) 05/02/2019  . AKI (acute kidney injury) (HCC) 05/02/2019  . Gross hematuria 05/02/2019  . Chronic diastolic CHF (congestive heart failure) (HCC) 05/02/2019  . Sepsis (HCC) 05/02/2019  . Severe sepsis (HCC) 05/02/2019  . Preop cardiovascular exam   . Hyperlipidemia 05/21/2016  . 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 Prediabetes 05/21/2016  . Alcohol abuse 05/18/2016  . Essential hypertension 05/18/2016  . Vasculitis, 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HCC) 05/18/2016  . Shortness of breath 05/17/2016   Family History  Problem Relation Age of Onset  . Cancer Mother   . Blindness Brother    Social History   Tobacco Use  . Smoking status: Never  . Smokeless tobacco: Never  Substance Use Topics  . Alcohol use: Yes   Current Meds  Medication Sig  . aspirin 81 MG EC tablet Take 81 mg by mouth daily. Swallow whole.  Marland Kitchen atorvastatin (LIPITOR) 80 MG tablet Take 80 mg by mouth daily.  . clopidogrel (PLAVIX) 75 MG tablet Take 75 mg by mouth daily.  . ferrous sulfate 325 (65 FE) MG EC tablet Take 1 tablet (325 mg total) by mouth 2 (two) times daily with a meal.  . furosemide (LASIX) 20 MG tablet Take 1 tablet (20 mg total) by mouth daily.  Marland Kitchen losartan (COZAAR) 50 MG tablet Take 50 mg by mouth daily.  Marland Kitchen  metoprolol tartrate (LOPRESSOR) 100 MG tablet Take 1.5 tablets by mouth daily.   Immunization History  Administered Date(s) Administered  . Influenza,inj,Quad PF,6+ Mos 11/02/2019  . Pneumococcal Conjugate-13 01/14/2007      Objective:   Physical Exam BP 112/80 (BP Location: Left Arm, Cuff Size: Normal)   Pulse 64   Temp 98 F (36.7 C)   Ht 5' 5.5" (1.664 m)   Wt 160 lb 3.2 oz (72.7 kg)   SpO2 100%   BMI 26.25 kg/m   SpO2: 100 % O2 Device: None (Room air)  GENERAL: HEAD: Normocephalic, atraumatic.  EYES:  Pupils equal, round, reactive to light.  No scleral icterus.  MOUTH:  NECK: Supple. No thyromegaly. Trachea midline. No JVD.  No adenopathy. PULMONARY: Good air entry bilaterally.  No adventitious sounds. CARDIOVASCULAR: S1 and S2. Regular rate and rhythm.  ABDOMEN: MUSCULOSKELETAL: No joint deformity, no clubbing, no edema.  NEUROLOGIC:  SKIN: Intact,warm,dry. PSYCH:  Lab Results  Component Value Date   NITRICOXIDE 31 05/20/2022        Assessment & Plan:

## 2022-05-21 LAB — ANCA PROFILE
Anti-MPO Antibodies: 3.6 units — ABNORMAL HIGH (ref 0.0–0.9)
Anti-PR3 Antibodies: <0.2 units (ref 0.0–0.9)
Atypical P-ANCA titer: <1:20 {titer}
C-ANCA: <1:20 {titer}
P-ANCA: <1:20 {titer}

## 2022-05-24 LAB — ALLERGEN PANEL (27) + IGE
Alternaria Alternata IgE: <0.1 kU/L
Aspergillus Fumigatus IgE: <0.1 kU/L
Bahia Grass IgE: <0.1 kU/L
Bermuda Grass IgE: <0.1 kU/L
Cat Dander IgE: <0.1 kU/L
Cedar, Mountain IgE: <0.1 kU/L
Cladosporium Herbarum IgE: <0.1 kU/L
Cocklebur IgE: <0.1 kU/L
Cockroach, American IgE: <0.1 kU/L
Common Silver Birch IgE: <0.1 kU/L
D Farinae IgE: <0.1 kU/L
D Pteronyssinus IgE: <0.1 kU/L
Dog Dander IgE: <0.1 kU/L
Elm, American IgE: <0.1 kU/L
Hickory, White IgE: <0.1 kU/L
IgE (Immunoglobulin E), Serum: 41 IU/mL (ref 6–495)
Johnson Grass IgE: <0.1 kU/L
Kentucky Bluegrass IgE: <0.1 kU/L
Maple/Box Elder IgE: <0.1 kU/L
Mucor Racemosus IgE: <0.1 kU/L
Oak, White IgE: <0.1 kU/L
Penicillium Chrysogen IgE: <0.1 kU/L
Pigweed, Rough IgE: <0.1 kU/L
Plantain, English IgE: <0.1 kU/L
Ragweed, Short IgE: <0.1 kU/L
Setomelanomma Rostrat: <0.1 kU/L
Timothy Grass IgE: <0.1 kU/L
White Mulberry IgE: <0.1 kU/L

## 2022-05-30 ENCOUNTER — Other Ambulatory Visit: Payer: Self-pay

## 2022-06-03 ENCOUNTER — Other Ambulatory Visit: Payer: Self-pay

## 2022-06-03 ENCOUNTER — Encounter: Payer: Self-pay | Admitting: Oncology

## 2022-06-03 ENCOUNTER — Telehealth: Payer: Self-pay

## 2022-06-03 ENCOUNTER — Ambulatory Visit: Payer: Self-pay | Attending: Pulmonary Disease

## 2022-06-03 DIAGNOSIS — R059 Cough, unspecified: Secondary | ICD-10-CM | POA: Insufficient documentation

## 2022-06-03 DIAGNOSIS — R0602 Shortness of breath: Secondary | ICD-10-CM | POA: Insufficient documentation

## 2022-06-03 DIAGNOSIS — R0609 Other forms of dyspnea: Secondary | ICD-10-CM | POA: Insufficient documentation

## 2022-06-03 DIAGNOSIS — F17210 Nicotine dependence, cigarettes, uncomplicated: Secondary | ICD-10-CM | POA: Insufficient documentation

## 2022-06-03 MED ORDER — TRELEGY ELLIPTA 100-62.5-25 MCG/ACT IN AEPB
1.0000 | INHALATION_SPRAY | Freq: Every day | RESPIRATORY_TRACT | 11 refills | Status: DC
  Filled 2022-06-03: qty 60, 30d supply, fill #0
  Filled 2022-07-14: qty 60, 30d supply, fill #1
  Filled 2022-08-20 (×2): qty 60, 30d supply, fill #2

## 2022-06-03 MED ORDER — ALBUTEROL SULFATE (2.5 MG/3ML) 0.083% IN NEBU
2.5000 mg | INHALATION_SOLUTION | Freq: Once | RESPIRATORY_TRACT | Status: AC
  Administered 2022-06-03: 2.5 mg via RESPIRATORY_TRACT
  Filled 2022-06-03: qty 3

## 2022-06-03 NOTE — Addendum Note (Signed)
Addended by: Lajoyce Lauber A on: 06/03/2022 12:05 PM   Modules accepted: Orders

## 2022-06-03 NOTE — Telephone Encounter (Signed)
Patient stopped by office and requested Rx for Trelegy. Rx sent to preferred pharmacy. Nothing further needed.

## 2022-06-05 ENCOUNTER — Other Ambulatory Visit: Payer: Self-pay

## 2022-07-01 ENCOUNTER — Other Ambulatory Visit: Payer: Self-pay

## 2022-07-01 ENCOUNTER — Encounter: Payer: Self-pay | Admitting: Gastroenterology

## 2022-07-01 ENCOUNTER — Encounter: Payer: Self-pay | Admitting: Oncology

## 2022-07-01 ENCOUNTER — Ambulatory Visit (INDEPENDENT_AMBULATORY_CARE_PROVIDER_SITE_OTHER): Payer: Self-pay | Admitting: Gastroenterology

## 2022-07-01 VITALS — BP 132/83 | HR 67 | Temp 98.6°F | Ht 65.5 in | Wt 157.0 lb

## 2022-07-01 DIAGNOSIS — D509 Iron deficiency anemia, unspecified: Secondary | ICD-10-CM

## 2022-07-01 MED ORDER — PEG 3350-KCL-NA BICARB-NACL 420 G PO SOLR
4000.0000 mL | Freq: Once | ORAL | 0 refills | Status: AC
  Filled 2022-07-01: qty 4000, 1d supply, fill #0

## 2022-07-01 MED ORDER — NA SULFATE-K SULFATE-MG SULF 17.5-3.13-1.6 GM/177ML PO SOLN
1.0000 | Freq: Once | ORAL | 0 refills | Status: AC
  Filled 2022-07-01: qty 354, fill #0

## 2022-07-01 MED ORDER — FERROUS SULFATE 325 (65 FE) MG PO TABS
325.0000 mg | ORAL_TABLET | Freq: Two times a day (BID) | ORAL | 2 refills | Status: DC
  Filled 2022-07-01: qty 100, 50d supply, fill #0
  Filled 2022-07-03: qty 180, 90d supply, fill #0
  Filled 2022-10-12: qty 180, 90d supply, fill #1
  Filled 2023-01-11: qty 180, 90d supply, fill #2

## 2022-07-01 NOTE — Progress Notes (Signed)
Gastroenterology Consultation  Referring Provider:     Center, [facility] Primary Care Physician:  Center, [facility] Primary Gastroenterologist:  Dr. Servando Snare     Reason for Consultation:     Iron deficiency anemia        HPI:   Mark Donaldson is a 62 y.o. y/o male referred for consultation & management of iron deficiency anemia by Dr. Eli Phillips, [facility].  This patient comes today and is accompanied by a Spanish interpreter because the patient does not speak [place].  The patient was found to have a low iron saturation with a low ferritin.  The patient has been taking iron and his hemoglobin and iron studies have come back up but his saturation and ferritin are still low.  The patient denies seeing any sign of GI bleeding.  He denies any black stools or bloody stools.  The patient also denies ever having a colonoscopy in the past.  There is no report of any abdominal pain or unexplained weight loss.  He does not recall ever being told that he had low iron before this.  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HCC)    CAD (coronary artery disease)    Essential hypertension    HFrEF (heart failure with reduced ejection fraction) (HCC)    Hyperlipidemia LDL goal <70    Ischemic cardiomyopathy    Renal cell carcinoma (HCC) 05/23/2019   Renal disorder     Past Surgical History:  Procedure Laterality Date   CARDIAC SURGERY     cardiac cath with 2 stent placement   LAPAROSCOPIC NEPHRECTOMY, HAND ASSISTED Left 05/05/2019   Procedure: HAND ASSISTED LAPAROSCOPIC NEPHRECTOMY;  Surgeon: Vanna Scotland, MD;  Location: [facility] ORS;  Service: Urology;  Laterality: Left;    Prior to Admission medications   Medication Sig Start Date End Date Taking? Authorizing Provider  aspirin 81 MG EC tablet Take 81 mg by mouth daily. Swallow whole.    [provider]  atorvastatin (LIPITOR) 80 MG tablet Take 80 mg by mouth daily.     [provider]  clopidogrel (PLAVIX) 75 MG tablet Take 75 mg by mouth daily.    [provider]  ferrous sulfate 325 (65 FE) MG EC tablet Take 1 tablet (325 mg total) by mouth 2 (two) times daily with a meal. 03/25/22   [name], MD  Fluticasone-Umeclidin-Vilant (TRELEGY ELLIPTA) 100-62.5-25 MCG/ACT AEPB Inhale 1 puff into the lungs daily. 06/03/22   Salena Saner, MD  furosemide (LASIX) 20 MG tablet Take 1 tablet (20 mg total) by mouth daily. 02/07/20   [name], MD  KEYTRUDA 100 MG/4ML SOLN INJECT 200MG  INTRAVENOUSLY EVERY 3 WEEKS Patient not taking: Reported on 03/25/2022 02/27/20   [name], MD  losartan (COZAAR) 50 MG tablet Take 50 mg by mouth daily. 04/10/22   [provider]  metoprolol succinate (TOPROL-XL) 100 MG 24 hr tablet Take 1.5 tablets by mouth daily. 02/24/19 03/25/22  [provider]  metoprolol tartrate (LOPRESSOR) 100 MG tablet Take 1.5 tablets by mouth daily.    [provider]    Family History  Problem Relation Age of Onset   Cancer Mother    Blindness Brother      Social History   Tobacco Use   Smoking status: Never   Smokeless tobacco: Never  Vaping Use   Vaping Use: Never used  Substance Use Topics   Alcohol use: Yes    Allergies as of  07/01/2022   (No Known Allergies)    Review of Systems:    All systems reviewed and negative except where noted in HPI.   Physical Exam:  There were no vitals taken for this visit. No LMP for male patient. General:   Alert,  Well-developed, well-nourished, pleasant and cooperative in NAD Head:  Normocephalic and atraumatic. Eyes:  Sclera clear, no icterus.   Conjunctiva pink. Ears:  Normal auditory acuity. Neck:  Supple; no masses or thyromegaly. Lungs:  Respirations even and unlabored.  Clear throughout to auscultation.   No wheezes, crackles, or rhonchi. No acute distress. Heart:  Regular rate and rhythm; no murmurs, clicks, rubs, or gallops. Abdomen:  Normal bowel  sounds.  No bruits.  Soft, non-tender and non-distended without masses, hepatosplenomegaly or hernias noted.  No guarding or rebound tenderness.  Negative Carnett sign.   Rectal:  Deferred.  Pulses:  Normal pulses noted. Extremities:  No clubbing or edema.  No cyanosis. Neurologic:  Alert and oriented x3;  grossly normal neurologically. Skin:  Intact without significant lesions or rashes.  No jaundice. Lymph Nodes:  No significant cervical adenopathy. Psych:  Alert and cooperative. Normal mood and affect.  Imaging Studies: No results found.  Assessment and Plan:   [name] is a 62 y.o. y/o male who comes in today with a finding of iron deficiency.  The patient does not see any sign of bleeding but he has low iron saturation and ferritin.  The patient will be set up for a colonoscopy and EGD.  The patient has been explained the causes of iron deficient anemia and how we are going to investigate it.  The patient agrees to undergoing the procedures.  The patient will follow-up at the time of the procedures.    Midge Minium, MD. Clementeen Graham    Note: This dictation was prepared with Dragon dictation along with smaller phrase technology. Any transcriptional errors that result from this process are unintentional.

## 2022-07-01 NOTE — Patient Instructions (Signed)
Spanish handout with instructions given to patient

## 2022-07-02 ENCOUNTER — Other Ambulatory Visit: Payer: Self-pay

## 2022-07-03 ENCOUNTER — Other Ambulatory Visit: Payer: Self-pay

## 2022-07-03 ENCOUNTER — Encounter: Payer: Self-pay | Admitting: Oncology

## 2022-07-04 ENCOUNTER — Other Ambulatory Visit (HOSPITAL_COMMUNITY): Payer: Self-pay

## 2022-07-14 ENCOUNTER — Encounter: Payer: Self-pay | Admitting: Oncology

## 2022-07-14 ENCOUNTER — Other Ambulatory Visit: Payer: Self-pay

## 2022-07-28 ENCOUNTER — Telehealth: Payer: Self-pay

## 2022-07-28 NOTE — Telephone Encounter (Signed)
Patient daughter Mark Donaldson is calling because her and the patient has some question about the patient upcoming colonoscopy. She is not on patient DPR but the patient gave the Okay for [place] to talk to her daughter and to call his daughter back.

## 2022-07-29 NOTE — Telephone Encounter (Signed)
 Left message on voicemail.

## 2022-07-30 ENCOUNTER — Encounter
Admission: RE | Disposition: A | Discharge status: Home / Self Care | Payer: Self-pay | Source: Home / Self Care | Attending: Gastroenterology

## 2022-07-30 ENCOUNTER — Ambulatory Visit
Admission: RE | Admit: 2022-07-30 | Discharge: 2022-07-30 | Disposition: A | Discharge status: Home / Self Care | Payer: Self-pay | Attending: Gastroenterology | Admitting: Gastroenterology

## 2022-07-30 ENCOUNTER — Ambulatory Visit: Payer: Self-pay | Admitting: Anesthesiology

## 2022-07-30 DIAGNOSIS — E785 Hyperlipidemia, unspecified: Secondary | ICD-10-CM | POA: Insufficient documentation

## 2022-07-30 DIAGNOSIS — K641 Second degree hemorrhoids: Secondary | ICD-10-CM | POA: Insufficient documentation

## 2022-07-30 DIAGNOSIS — I255 Ischemic cardiomyopathy: Secondary | ICD-10-CM | POA: Insufficient documentation

## 2022-07-30 DIAGNOSIS — K2970 Gastritis, unspecified, without bleeding: Secondary | ICD-10-CM | POA: Insufficient documentation

## 2022-07-30 DIAGNOSIS — D509 Iron deficiency anemia, unspecified: Secondary | ICD-10-CM | POA: Insufficient documentation

## 2022-07-30 DIAGNOSIS — I5022 Chronic systolic (congestive) heart failure: Secondary | ICD-10-CM | POA: Insufficient documentation

## 2022-07-30 DIAGNOSIS — I110 Hypertensive heart disease with heart failure: Secondary | ICD-10-CM | POA: Insufficient documentation

## 2022-07-30 DIAGNOSIS — B9681 Helicobacter pylori [H. pylori] as the cause of diseases classified elsewhere: Secondary | ICD-10-CM | POA: Insufficient documentation

## 2022-07-30 DIAGNOSIS — Z79899 Other long term (current) drug therapy: Secondary | ICD-10-CM | POA: Insufficient documentation

## 2022-07-30 DIAGNOSIS — K5730 Diverticulosis of large intestine without perforation or abscess without bleeding: Secondary | ICD-10-CM | POA: Insufficient documentation

## 2022-07-30 DIAGNOSIS — K2980 Duodenitis without bleeding: Secondary | ICD-10-CM | POA: Insufficient documentation

## 2022-07-30 DIAGNOSIS — I2510 Atherosclerotic heart disease of native coronary artery without angina pectoris: Secondary | ICD-10-CM | POA: Insufficient documentation

## 2022-07-30 HISTORY — PX: ESOPHAGOGASTRODUODENOSCOPY (EGD) WITH PROPOFOL: SHX5813

## 2022-07-30 HISTORY — PX: BIOPSY: SHX5522

## 2022-07-30 HISTORY — PX: COLONOSCOPY WITH PROPOFOL: SHX5780

## 2022-07-30 SURGERY — COLONOSCOPY WITH PROPOFOL
Anesthesia: General

## 2022-07-30 MED ORDER — STERILE WATER FOR IRRIGATION IR SOLN
Status: DC | PRN
  Administered 2022-07-30: 100 mL

## 2022-07-30 MED ORDER — PROPOFOL 10 MG/ML IV BOLUS
INTRAVENOUS | Status: DC | PRN
  Administered 2022-07-30: 50 mg via INTRAVENOUS
  Administered 2022-07-30 (×6): 20 mg via INTRAVENOUS
  Administered 2022-07-30: 40 mg via INTRAVENOUS
  Administered 2022-07-30 (×4): 20 mg via INTRAVENOUS

## 2022-07-30 MED ORDER — PHENYLEPHRINE HCL (PRESSORS) 10 MG/ML IV SOLN
INTRAVENOUS | Status: DC | PRN
  Administered 2022-07-30 (×3): 100 ug via INTRAVENOUS

## 2022-07-30 MED ORDER — PROPOFOL 1000 MG/100ML IV EMUL
INTRAVENOUS | Status: AC
  Filled 2022-07-30: qty 100

## 2022-07-30 MED ORDER — SODIUM CHLORIDE 0.9 % IV SOLN
INTRAVENOUS | Status: DC
  Administered 2022-07-30: 20 mL/h via INTRAVENOUS

## 2022-07-30 MED ORDER — LIDOCAINE HCL (PF) 2 % IJ SOLN
INTRAMUSCULAR | Status: DC | PRN
  Administered 2022-07-30: 40 mg via INTRADERMAL

## 2022-07-30 NOTE — H&P (Signed)
Midge Minium, MD [facility] [address], [address] [place], [place] [address] Phone:[phone] Fax : [phone]  Primary Care Physician:  Center, [facility] Primary Gastroenterologist:  Dr. Servando Snare  Pre-Procedure History & Physical: HPI:  Mark Donaldson is a 62 y.o. male is here for an endoscopy and colonoscopy.   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HCC)    CAD (coronary artery disease)    Essential hypertension    HFrEF (heart failure with reduced ejection fraction) (HCC)    Hyperlipidemia LDL goal <70    Ischemic cardiomyopathy    Renal cell carcinoma (HCC) 05/23/2019   Renal disorder     Past Surgical History:  Procedure Laterality Date   CARDIAC SURGERY     cardiac cath with 2 stent placement   LAPAROSCOPIC NEPHRECTOMY, HAND ASSISTED Left 05/05/2019   Procedure: HAND ASSISTED LAPAROSCOPIC NEPHRECTOMY;  Surgeon: Vanna Scotland, MD;  Location: [facility] ORS;  Service: Urology;  Laterality: Left;    Prior to Admission medications   Medication Sig Start Date End Date Taking? Authorizing Provider  aspirin 81 MG EC tablet Take 81 mg by mouth daily. Swallow whole.   Yes [provider]  atorvastatin (LIPITOR) 80 MG tablet Take 80 mg by mouth daily.   Yes [provider]  clopidogrel (PLAVIX) 75 MG tablet Take 75 mg by mouth daily.   Yes [provider]  ferrous sulfate 325 (65 FE) MG tablet Take 1 tablet (325 mg total) by mouth 2 (two) times daily with a meal. 07/01/22  Yes Santanna Whitford, MD  Fluticasone-Umeclidin-Vilant (TRELEGY ELLIPTA) 100-62.5-25 MCG/ACT AEPB Inhale 1 puff into the lungs daily. 06/03/22  Yes Salena Saner, MD  furosemide (LASIX) 20 MG tablet Take 1 tablet (20 mg total) by mouth daily. 02/07/20  Yes [name], MD  KEYTRUDA 100 MG/4ML SOLN INJECT 200MG  INTRAVENOUSLY EVERY 3 WEEKS 02/27/20  Yes [name], MD  losartan (COZAAR) 50 MG tablet Take 50 mg by mouth daily. 04/10/22  Yes [provider]  metoprolol tartrate (LOPRESSOR) 100 MG tablet Take 1.5 tablets by mouth daily.   Yes [provider]  metoprolol succinate (TOPROL-XL) 100 MG 24 hr tablet Take 1.5 tablets by mouth daily. 02/24/19 03/25/22  [provider]    Allergies as of 07/01/2022   (No Known Allergies)    Family History  Problem Relation Age of Onset   Cancer Mother    Blindness Brother     Social History   Socioeconomic History   Marital status: Married    Spouse name: Not on file   Number of children: Not on file   Years of education: Not on file   Highest education level: Not on file  Occupational History   Not on file  Tobacco Use   Smoking status: Never   Smokeless tobacco: Never  Vaping Use   Vaping status: Never Used  Substance and Sexual Activity   Alcohol use: Yes   Drug use: Not on file   Sexual activity: Not on file  Other Topics Concern   Not on file  Social History Narrative   Not on file   Social Determinants of Health   Financial Resource Strain: Not on file  Food Insecurity: No Food Insecurity (07/14/2022)   Received from [facility]   Hunger Vital Sign    Worried About Running Out of Food in the Last Year: Never true    Ran Out of Food in the Last Year: Never true  Transportation Needs: No Transportation Needs (07/14/2022)   Received from [facility]   PRAPARE - Transportation    Lack of Transportation (Medical): No    Lack of Transportation (Non-Medical): No  Physical Activity: Not on file  Stress: Not on file  Social Connections: Not on file  Intimate Partner Violence: Not on file    Review of Systems: See HPI, otherwise negative ROS  Physical Exam: BP 131/88   Pulse 77   Temp (!) 96.3 F (35.7 C) (Temporal)   Resp 18   Ht 5\' 6"  (1.676 m)   Wt 69.4 kg   SpO2 100%   BMI 24.69 kg/m  General:   Alert,  pleasant and cooperative in NAD Head:  Normocephalic and atraumatic. Neck:  Supple; no masses or  thyromegaly. Lungs:  Clear throughout to auscultation.    Heart:  Regular rate and rhythm. Abdomen:  Soft, nontender and nondistended. Normal bowel sounds, without guarding, and without rebound.   Neurologic:  Alert and  oriented x4;  grossly normal neurologically.  Impression/Plan: Mark Donaldson is here for an endoscopy and colonoscopy to be performed for low iron anemia  Risks, benefits, limitations, and alternatives regarding  endoscopy and colonoscopy have been reviewed with the patient.  Questions have been answered.  All parties agreeable.   Midge Minium, MD  07/30/2022, 8:58 AM

## 2022-07-30 NOTE — Op Note (Signed)
[facility] Patient Name: Mark Donaldson Procedure Date: 07/30/2022 9:03 AM MRN: [id number] Account #: [id number] Date of Birth: January 23, 1960 Admit Type: Outpatient Age: 62 Room: [facility] ENDO ROOM 4 Gender: Male Note Status: Finalized Instrument Name: Prentice Docker [id number] Procedure:             Colonoscopy Indications:           Iron deficiency anemia Providers:             Midge Minium MD, MD Referring MD:          Clinic [facility], MD (Referring MD) Medicines:             Propofol per Anesthesia Complications:         No immediate complications. Procedure:             Pre-Anesthesia Assessment:                        - Prior to the procedure, a History and Physical was                         performed, and patient medications and allergies were                         reviewed. The patient's tolerance of previous                         anesthesia was also reviewed. The risks and benefits                         of the procedure and the sedation options and risks                         were discussed with the patient. All questions were                         answered, and informed consent was obtained. Prior                         Anticoagulants: The patient has taken no anticoagulant                         or antiplatelet agents. ASA Grade Assessment: II - A                         patient with mild systemic disease. After reviewing                         the risks and benefits, the patient was deemed in                         satisfactory condition to undergo the procedure.                        After obtaining informed consent, the colonoscope was                         passed under direct vision. Throughout the procedure,  the patient's blood pressure, pulse, and oxygen                         saturations were monitored continuously. The                         Colonoscope was  introduced through the anus and                         advanced to the the cecum, identified by appendiceal                         orifice and ileocecal valve. The colonoscopy was                         performed without difficulty. The patient tolerated                         the procedure well. The quality of the bowel                         preparation was excellent. Findings:      The perianal and digital rectal examinations were normal.      A few small-mouthed diverticula were found in the entire colon.      Non-bleeding internal hemorrhoids were found during retroflexion. The       hemorrhoids were Grade II (internal hemorrhoids that prolapse but reduce       spontaneously). Impression:            - Diverticulosis in the entire examined colon.                        - Non-bleeding internal hemorrhoids.                        - No specimens collected. Recommendation:        - Discharge patient to home.                        - Resume previous diet.                        - Continue present medications.                        - Repeat colonoscopy in 10 years for screening                         purposes. Procedure Code(s):     --- Professional ---                        [phone], Colonoscopy, flexible; diagnostic, including                         collection of specimen(s) by brushing or washing, when                         performed (separate procedure) Diagnosis Code(s):     --- Professional ---  D50.9, Iron deficiency anemia, unspecified CPT copyright 2022 American Medical Association. All rights reserved. The codes documented in this report are preliminary and upon coder review may  be revised to meet current compliance requirements. Midge Minium MD, MD 07/30/2022 9:36:02 AM This report has been signed electronically. Number of Addenda: 0 Note Initiated On: 07/30/2022 9:03 AM Scope Withdrawal Time: 0 hours 5 minutes 56 seconds  Total Procedure  Duration: 0 hours 12 minutes 53 seconds  Estimated Blood Loss:  Estimated blood loss: none. Estimated blood loss: none.      [facility]

## 2022-07-30 NOTE — Op Note (Signed)
[facility] Patient Name: Mark Donaldson Procedure Date: 07/30/2022 9:04 AM MRN: [id number] Account #: [id number] Date of Birth: 05-21-1960 Admit Type: Outpatient Age: 62 Room: [facility] ENDO ROOM 4 Gender: Male Note Status: Finalized Instrument Name: Upper Endoscope [id number] Procedure:             Upper GI endoscopy Indications:           Iron deficiency anemia Providers:             Midge Minium MD, MD Referring MD:          Clinic [facility], MD (Referring MD) Medicines:             Propofol per Anesthesia Complications:         No immediate complications. Procedure:             Pre-Anesthesia Assessment:                        - Prior to the procedure, a History and Physical was                         performed, and patient medications and allergies were                         reviewed. The patient's tolerance of previous                         anesthesia was also reviewed. The risks and benefits                         of the procedure and the sedation options and risks                         were discussed with the patient. All questions were                         answered, and informed consent was obtained. Prior                         Anticoagulants: The patient has taken no anticoagulant                         or antiplatelet agents. ASA Grade Assessment: II - A                         patient with mild systemic disease. After reviewing                         the risks and benefits, the patient was deemed in                         satisfactory condition to undergo the procedure.                        After obtaining informed consent, the endoscope was                         passed under direct vision. Throughout the procedure,  the patient's blood pressure, pulse, and oxygen                         saturations were monitored continuously. The Endoscope                         was  introduced through the mouth, and advanced to the                         second part of duodenum. The upper GI endoscopy was                         accomplished without difficulty. The patient tolerated                         the procedure well. Findings:      The examined esophagus was normal.      Localized moderate inflammation characterized by erosions was found in       the gastric antrum. Biopsies were taken with a cold forceps for       histology.      A few erosions without bleeding were found in the duodenal bulb and in       the first portion of the duodenum. Impression:            - Normal esophagus.                        - Gastritis. Biopsied.                        - Duodenal erosions without bleeding. Recommendation:        - Discharge patient to home.                        - Resume previous diet.                        - Continue present medications.                        - No aspirin, ibuprofen, naproxen, or other                         non-steroidal anti-inflammatory drugs. Procedure Code(s):     --- Professional ---                        [phone], Esophagogastroduodenoscopy, flexible,                         transoral; with biopsy, single or multiple Diagnosis Code(s):     --- Professional ---                        D50.9, Iron deficiency anemia, unspecified                        K29.70, Gastritis, unspecified, without bleeding                        K26.9, Duodenal ulcer, unspecified as acute or  chronic, without hemorrhage or perforation CPT copyright 2022 American Medical Association. All rights reserved. The codes documented in this report are preliminary and upon coder review may  be revised to meet current compliance requirements. Midge Minium MD, MD 07/30/2022 9:18:00 AM This report has been signed electronically. Number of Addenda: 0 Note Initiated On: 07/30/2022 9:04 AM Estimated Blood Loss:  Estimated blood loss: none.       [facility]

## 2022-07-30 NOTE — OR Nursing (Signed)
[facility] Interpreter Claretha Cooper Murr) used as Equities trader in [employer].

## 2022-07-30 NOTE — Anesthesia Postprocedure Evaluation (Signed)
Anesthesia Post Note  Patient: Mark Donaldson  Procedure(s) Performed: COLONOSCOPY WITH PROPOFOL ESOPHAGOGASTRODUODENOSCOPY (EGD) WITH PROPOFOL BIOPSY  Patient location during evaluation: Endoscopy Anesthesia Type: General Level of consciousness: awake and alert Pain management: pain level controlled Vital Signs Assessment: post-procedure vital signs reviewed and stable Respiratory status: spontaneous breathing, nonlabored ventilation, respiratory function stable and patient connected to nasal cannula oxygen Cardiovascular status: blood pressure returned to baseline and stable Postop Assessment: no apparent nausea or vomiting Anesthetic complications: no   No notable events documented.   Last Vitals:  Vitals:   07/30/22 0844 07/30/22 0936  BP: 131/88 102/67  Pulse: 77 77  Resp: 18 20  Temp: (!) 35.7 C (!) 36.3 C  SpO2: 100% 97%    Last Pain:  Vitals:   07/30/22 0946  TempSrc:   PainSc: 0-No pain                 Corinda Gubler

## 2022-07-30 NOTE — Transfer of Care (Signed)
Immediate Anesthesia Transfer of Care Note  Patient: Mark Donaldson  Procedure(s) Performed: COLONOSCOPY WITH PROPOFOL ESOPHAGOGASTRODUODENOSCOPY (EGD) WITH PROPOFOL BIOPSY  Patient Location: PACU and Endoscopy Unit  Anesthesia Type:MAC  Level of Consciousness: drowsy  Airway & Oxygen Therapy: Patient Spontanous Breathing and Patient connected to nasal cannula oxygen  Post-op Assessment: Report given to RN and Post -op Vital signs reviewed and stable  Post vital signs: Reviewed and stable  Last Vitals:  Vitals Value Taken Time  BP 102/67 07/30/22 0936  Temp 36.3 C 07/30/22 0936  Pulse 76 07/30/22 0937  Resp 19 07/30/22 0937  SpO2 97 % 07/30/22 0937  Vitals shown include unfiled device data.  Last Pain:  Vitals:   07/30/22 0936  TempSrc: Temporal  PainSc: Asleep         Complications: No notable events documented.

## 2022-07-30 NOTE — Anesthesia Preprocedure Evaluation (Signed)
Anesthesia Evaluation  Patient identified by MRN, date of birth, ID band Patient awake  General Assessment Comment:Never had general anesthesia  Reviewed: Allergy & Precautions, NPO status , Patient's Chart, lab work & pertinent test results  History of Anesthesia Complications Negative for: history of anesthetic complications  Airway Mallampati: III  TM Distance: >3 FB Neck ROM: Full    Dental no notable dental hx. (+) Teeth Intact, Dental Advisory Given   Pulmonary neg pulmonary ROS, neg sleep apnea, neg COPD, Patient abstained from smoking.Not current smoker   Pulmonary exam normal breath sounds clear to auscultation       Cardiovascular Exercise Tolerance: Good METShypertension, Pt. on medications + CAD and +CHF  (-) Past MI (-) dysrhythmias  Rhythm:Regular Rate:Normal - Systolic murmurs  TTE 2021: 1. Left ventricular ejection fraction, by estimation, is 60 to 65%. The  left ventricle has normal function. The left ventricle has no regional  wall motion abnormalities. There is mild left ventricular hypertrophy.  Left ventricular diastolic parameters  are consistent with Grade I diastolic dysfunction (impaired relaxation).   2. Right ventricular systolic function is normal. The right ventricular  size is normal.   3. Left atrial size was mildly dilated.   4. The mitral valve is normal in structure. Trivial mitral valve  regurgitation.   5. The aortic valve is tricuspid. Aortic valve regurgitation is not  visualized. Mild aortic valve sclerosis is present, with no evidence of  aortic valve stenosis.    Neuro/Psych negative neurological ROS  negative psych ROS   GI/Hepatic ,neg GERD  ,,(+)     (-) substance abuse    Endo/Other  neg diabetes    Renal/GU Renal disease  negative genitourinary   Musculoskeletal   Abdominal   Peds negative pediatric ROS (+)  Hematology   Anesthesia Other Findings Past Medical  History: No date: ANCA-positive vasculitis (HCC) No date: CAD (coronary artery disease) No date: Essential hypertension No date: HFrEF (heart failure with reduced ejection fraction) (HCC) No date: Hyperlipidemia LDL goal <70 No date: Ischemic cardiomyopathy No date: Renal disorder    Reproductive/Obstetrics                              Anesthesia Physical Anesthesia Plan  ASA: 3  Anesthesia Plan: General   Post-op Pain Management: Minimal or no pain anticipated   Induction: Intravenous  PONV Risk Score and Plan: 3 and Propofol infusion, TIVA and Ondansetron  Airway Management Planned: Nasal Cannula  Additional Equipment: None  Intra-op Plan:   Post-operative Plan:   Informed Consent: I have reviewed the patients History and Physical, chart, labs and discussed the procedure including the risks, benefits and alternatives for the proposed anesthesia with the patient or authorized representative who has indicated his/her understanding and acceptance.     Dental advisory given and Interpreter used for interveiw (spanish interpreter at bedside)  Plan Discussed with: CRNA and Surgeon  Anesthesia Plan Comments: (Discussed risks of anesthesia with patient, including possibility of difficulty with spontaneous ventilation under anesthesia necessitating airway intervention, PONV, and rare risks such as cardiac or respiratory or neurological events, and allergic reactions. Discussed the role of CRNA in patient's perioperative care. Patient understands.)         Anesthesia Quick Evaluation

## 2022-07-31 ENCOUNTER — Encounter: Payer: Self-pay | Admitting: Gastroenterology

## 2022-08-02 ENCOUNTER — Encounter: Payer: Self-pay | Admitting: Gastroenterology

## 2022-08-20 ENCOUNTER — Other Ambulatory Visit: Payer: Self-pay

## 2022-08-20 ENCOUNTER — Encounter: Payer: Self-pay | Admitting: Oncology

## 2022-08-21 ENCOUNTER — Other Ambulatory Visit: Payer: Self-pay

## 2022-08-26 ENCOUNTER — Ambulatory Visit: Payer: Self-pay | Admitting: Pulmonary Disease

## 2022-08-28 ENCOUNTER — Other Ambulatory Visit: Payer: Self-pay

## 2022-09-01 ENCOUNTER — Other Ambulatory Visit: Payer: Self-pay

## 2022-09-01 MED ORDER — TRELEGY ELLIPTA 100-62.5-25 MCG/ACT IN AEPB: 1.0000 | INHALATION_SPRAY | Freq: Every day | RESPIRATORY_TRACT | 3 refills | Status: DC

## 2022-09-14 DIAGNOSIS — R918 Other nonspecific abnormal finding of lung field: Secondary | ICD-10-CM

## 2022-09-14 DIAGNOSIS — R590 Localized enlarged lymph nodes: Secondary | ICD-10-CM

## 2022-09-14 HISTORY — DX: Localized enlarged lymph nodes: R59.0

## 2022-09-14 HISTORY — DX: Other nonspecific abnormal finding of lung field: R91.8

## 2022-09-23 ENCOUNTER — Encounter: Payer: Self-pay | Admitting: Oncology

## 2022-09-23 ENCOUNTER — Other Ambulatory Visit: Payer: Self-pay

## 2022-09-23 ENCOUNTER — Encounter: Payer: Self-pay | Admitting: Pulmonary Disease

## 2022-09-23 ENCOUNTER — Ambulatory Visit (INDEPENDENT_AMBULATORY_CARE_PROVIDER_SITE_OTHER): Payer: Self-pay | Admitting: Pulmonary Disease

## 2022-09-23 VITALS — BP 120/78 | HR 64 | Temp 97.5°F | Ht 66.0 in | Wt 153.2 lb

## 2022-09-23 DIAGNOSIS — D721 Eosinophilia, unspecified: Secondary | ICD-10-CM

## 2022-09-23 DIAGNOSIS — I7782 Antineutrophilic cytoplasmic antibody (ANCA) vasculitis: Secondary | ICD-10-CM

## 2022-09-23 DIAGNOSIS — R059 Cough, unspecified: Secondary | ICD-10-CM

## 2022-09-23 DIAGNOSIS — R918 Other nonspecific abnormal finding of lung field: Secondary | ICD-10-CM

## 2022-09-23 DIAGNOSIS — J4540 Moderate persistent asthma, uncomplicated: Secondary | ICD-10-CM

## 2022-09-23 LAB — NITRIC OXIDE: Nitric Oxide: 18

## 2022-09-23 MED ORDER — TRELEGY ELLIPTA 100-62.5-25 MCG/ACT IN AEPB
1.0000 | INHALATION_SPRAY | Freq: Every day | RESPIRATORY_TRACT | 11 refills | Status: DC
  Filled 2022-09-23: qty 180, 90d supply, fill #0
  Filled 2022-12-26: qty 180, 90d supply, fill #1
  Filled 2023-05-19: qty 180, 90d supply, fill #2

## 2022-09-23 MED ORDER — ALBUTEROL SULFATE HFA 108 (90 BASE) MCG/ACT IN AERS
2.0000 | INHALATION_SPRAY | Freq: Four times a day (QID) | RESPIRATORY_TRACT | 2 refills | Status: AC | PRN
  Filled 2022-09-23: qty 6.7, 25d supply, fill #0

## 2022-09-23 MED ORDER — ALBUTEROL SULFATE HFA 108 (90 BASE) MCG/ACT IN AERS: 2.0000 | INHALATION_SPRAY | Freq: Four times a day (QID) | RESPIRATORY_TRACT | 2 refills | Status: DC | PRN

## 2022-09-23 MED ORDER — TRELEGY ELLIPTA 100-62.5-25 MCG/ACT IN AEPB: 1.0000 | INHALATION_SPRAY | Freq: Every day | RESPIRATORY_TRACT | 11 refills | Status: DC

## 2022-09-23 NOTE — Patient Instructions (Addendum)
You have asthma.  The level of inflammation in your airway today was well-controlled with the inhaler you have been using.  Continue using your Trelegy Ellipta 1 inhalation daily.  Rinse your mouth well after you use it.  You also received a prescription for albuterol which is a rescue inhaler that you can use up to 4 times a day if you notice shortness of breath or worsening cough.  Have a CT scan upcoming on 12 September, keep that appointment.  I will do that CT when it is available.  We will see her in follow-up in 3 months time.  Call sooner should any new problems arise.   Tienes asma.  El nivel de inflamacin en sus vas respiratorias hoy estuvo bien controlado con el inhalador que ha [place].  Contine usando su inhalador Trelegy Ellipta 1 inhalacion diariamente.  Enjuguese bien la boca despus de usarlo.  Tambin recibi una receta de albuterol, que es Advertising account executive de rescate que puede usar hasta 4 veces al da si nota dificultad para respirar o tos que [place].  Tiene una tomografa computarizada el prximo 12 de septiembre, asista a esa cita.  Har esa tomografa computarizada cuando est disponible.  La veremos en seguimiento dentro de 3 meses.  Llame antes si surge algn problema nuevo.

## 2022-09-23 NOTE — Progress Notes (Signed)
Subjective:    Patient ID: Mark Donaldson, male    DOB: Mar 21, 1960, 62 y.o.   MRN: [id number]  Patient Care Team: Center, [facility] as PCP - General (General Practice) [name], MD as Consulting Physician (Oncology)  Chief Complaint  Patient presents with   Follow-up    No cough, SOB or wheezing.   *Patient was interviewed in his native language, Spanish, in which I am proficient.   HPI Mark Donaldson is a 62 year old lifelong never smoker with a very complex history as noted below who presents for evaluation of nonspecific changes seen on CT chest and on moderate persistent asthma. He was initially seen here on 20 May 2022 at that time he was noted to have end expiratory wheezing and a nitric oxide of 31 ppb.  He was started on Trelegy Ellipta 100.  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  The patient underwent left nephrectomy in May 2021.  He has been followed by oncology and underwent 34 cycles of Keytruda (2 years treatment) and has not had any obvious signs of recurrence.  He has been on surveillance CTs.  CTs have shown persistent groundglass opacities/nodularity on the left lung consistent with infection/inflammation.  We are asked to render opinion as to the etiology of these changes.  The changes have been present since imaging performed November 2023.  Since his prior visit the patient does not note any fevers, chills or sweats.  No wheezing.  Previously he had a dry nonproductive cough but this has now resolved since he started Trelegy.  No hemoptysis.  He has not had dyspnea on exertion since he started Trelegy.  He is currently not on any oncologic medications.  He has not had any gastroesophageal reflux symptoms, no abdominal pain, nausea or vomiting.  No orthopnea or paroxysmal nocturnal dyspnea.  No lower extremity edema nor calf tenderness. Review of his laboratory data shows that over  the last 7 months he has had a relative eosinophilia.   Allergen panel performed 20 May 2022 was negative with an IgE of 41, ANCA profile was positive at 3.6 anti-MPO.  He is to have CT chest, abdomen pelvis on 12 September.   Review of Systems A 10 point review of systems was performed and it is as noted above otherwise negative.   Patient Active Problem List   Diagnosis Date Noted   Gastritis without bleeding 07/30/2022   Cough 06/03/2022   IDA (iron deficiency anemia) 11/21/2021   Avascular necrosis of bones of both hips (HCC) 06/20/2021   Transaminitis 01/17/2021   History of CHF (congestive heart failure) 05/01/2020   Hyperglycemia 02/28/2020   Stage 3b chronic kidney disease (HCC) 10/04/2019   Brain lesion 10/04/2019   Encounter for antineoplastic immunotherapy 07/19/2019   Encounter for antineoplastic chemotherapy 07/19/2019   CNS lesion 07/07/2019   Anemia in stage 3b chronic kidney disease (HCC) 06/07/2019   Goals of care, counseling/discussion 05/23/2019   Renal cell carcinoma of left kidney (HCC) 05/23/2019   Renal cell carcinoma (HCC) 05/23/2019   Palliative care encounter    History of vasculitis    Thoracic lymphadenopathy    Lung nodule    CAD S/P percutaneous coronary angioplasty 05/02/2019   Kidney mass 05/02/2019   Hydronephrosis of left kidney 05/02/2019   UTI (urinary tract infection) 05/02/2019   AKI (acute kidney injury) (HCC) 05/02/2019   Gross hematuria 05/02/2019   Chronic diastolic  CHF (congestive heart failure) (HCC) 05/02/2019   Sepsis (HCC) 05/02/2019   Severe sepsis (HCC) 05/02/2019   Preop cardiovascular exam    Hyperlipidemia 05/21/2016   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   aspirin 81 MG EC tablet Take 81 mg by mouth daily. Swallow whole.   clopidogrel (PLAVIX) 75 MG tablet Take 75 mg by mouth daily.   ferrous sulfate 325 (65 FE) MG tablet Take 1 tablet (325 mg total) by mouth 2 (two) times daily with a meal.   Fluticasone-Umeclidin-Vilant (TRELEGY ELLIPTA) 100-62.5-25 MCG/ACT AEPB Inhale 1 puff into the lungs daily.   furosemide (LASIX) 20 MG tablet Take 1 tablet (20 mg total) by mouth daily.   losartan (COZAAR) 50 MG tablet Take 50 mg by mouth daily.   metoprolol tartrate (LOPRESSOR) 100 MG tablet Take 1.5 tablets by mouth daily.   [DISCONTINUED] albuterol (VENTOLIN HFA) 108 (90 Base) MCG/ACT inhaler Inhale 2 puffs into the lungs every 6 (six) hours as needed for wheezing or shortness of breath.   [DISCONTINUED] Fluticasone-Umeclidin-Vilant (TRELEGY ELLIPTA) 100-62.5-25 MCG/ACT AEPB Inhale 1 puff into the lungs daily.    Immunization History  Administered Date(s) Administered   Influenza,inj,Quad PF,6+ Mos 11/02/2019   Pneumococcal Conjugate-13 01/14/2007       Objective:     BP 120/78 (BP Location: Right Arm, Cuff Size: Normal)   Pulse 64   Temp (!) 97.5 F (36.4 C)   Ht 5\' 6"  (1.676 m)   Wt 153 lb 3.2 oz (69.5 kg)   SpO2 98%   BMI 24.73 kg/m   SpO2: 98 % O2 Device: None (Room air)  GENERAL: Well-developed, well-nourished gentleman, no acute distress.  Fully ambulatory.  No conversational dyspnea. HEAD: Normocephalic, atraumatic.  EYES: Pupils equal, round, reactive to light.  No scleral icterus.  MOUTH: Dentition intact, oral mucosa moist.  No thrush. NECK: Supple. No thyromegaly. Trachea midline. No JVD.  No adenopathy. PULMONARY: Good air entry bilaterally.  No  adventitious sounds noted today. CARDIOVASCULAR: S1 and S2. Regular rate and rhythm.  No rubs, murmurs or gallops heard.   ABDOMEN: Benign. MUSCULOSKELETAL: No joint deformity, no clubbing, no edema.  NEUROLOGIC: No overt focal deficit, no gait disturbance, speech is fluent. SKIN: Intact,warm,dry. PSYCH: Mood and behavior normal.  Lab Results  Component Value Date   NITRICOXIDE 18 09/23/2022     Assessment & Plan:     ICD-10-CM   1. Moderate persistent asthma without complication  J45.40 Nitric oxide   Continue Trelegy Ellipta 100 Continue as needed albuterol    2. Vasculitis, 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HCC)  I77.82    This issue adds complexity to his management    3. Eosinophilia, unspecified type  D72.10    Related to vasculitis/asthma Continue to monitor    4. Abnormal findings on diagnostic imaging of lung  R91.8    Will review CT chest once this is available Patient to have CT on 12 September  Orders Placed This Encounter  Procedures   Nitric oxide    Meds ordered this encounter  Medications   Fluticasone-Umeclidin-Vilant (TRELEGY ELLIPTA) 100-62.5-25 MCG/ACT AEPB    Sig: Inhale 1 puff into the lungs daily.    Dispense:  60 each    Refill:  11    stock size 60   albuterol (VENTOLIN HFA) 108 (90 Base) MCG/ACT inhaler    Sig: Inhale 2 puffs into the lungs every 6 (six) hours as needed for wheezing or shortness of breath.    Dispense:  6.7 g    Refill:  2   We will see the patient in follow-up in 3 months time.  We will review chest CT once available.  If there are any issues noted will call the patient accordingly.   Gailen Shelter, MD Advanced Bronchoscopy PCCM [facility]    *This note was dictated using voice recognition software/Dragon.  Despite best efforts to proofread, errors can occur which can change the meaning. Any transcriptional errors that result from this process are unintentional and may not be fully corrected at the time of  dictation.

## 2022-09-24 ENCOUNTER — Other Ambulatory Visit: Payer: Self-pay

## 2022-09-25 ENCOUNTER — Ambulatory Visit
Admission: RE | Admit: 2022-09-25 | Discharge: 2022-09-25 | Disposition: A | Discharge status: Home / Self Care | Payer: Self-pay | Source: Ambulatory Visit | Attending: Oncology | Admitting: Oncology

## 2022-09-25 DIAGNOSIS — C642 Malignant neoplasm of left kidney, except renal pelvis: Secondary | ICD-10-CM | POA: Insufficient documentation

## 2022-10-02 ENCOUNTER — Telehealth: Payer: Self-pay

## 2022-10-02 ENCOUNTER — Inpatient Hospital Stay (HOSPITAL_BASED_OUTPATIENT_CLINIC_OR_DEPARTMENT_OTHER): Payer: Self-pay | Admitting: Oncology

## 2022-10-02 ENCOUNTER — Ambulatory Visit (INDEPENDENT_AMBULATORY_CARE_PROVIDER_SITE_OTHER): Payer: Self-pay | Admitting: Pulmonary Disease

## 2022-10-02 ENCOUNTER — Inpatient Hospital Stay: Payer: Self-pay | Attending: Oncology

## 2022-10-02 ENCOUNTER — Encounter: Payer: Self-pay | Admitting: Pulmonary Disease

## 2022-10-02 ENCOUNTER — Encounter: Payer: Self-pay | Admitting: Oncology

## 2022-10-02 VITALS — BP 131/98 | HR 73 | Temp 97.5°F | Resp 18 | Wt 154.9 lb

## 2022-10-02 VITALS — BP 116/80 | HR 74 | Temp 98.0°F | Ht 66.0 in | Wt 154.9 lb

## 2022-10-02 DIAGNOSIS — R590 Localized enlarged lymph nodes: Secondary | ICD-10-CM | POA: Insufficient documentation

## 2022-10-02 DIAGNOSIS — I7782 Antineutrophilic cytoplasmic antibody (ANCA) vasculitis: Secondary | ICD-10-CM

## 2022-10-02 DIAGNOSIS — D508 Other iron deficiency anemias: Secondary | ICD-10-CM

## 2022-10-02 DIAGNOSIS — N1832 Chronic kidney disease, stage 3b: Secondary | ICD-10-CM

## 2022-10-02 DIAGNOSIS — C642 Malignant neoplasm of left kidney, except renal pelvis: Secondary | ICD-10-CM

## 2022-10-02 DIAGNOSIS — R350 Frequency of micturition: Secondary | ICD-10-CM | POA: Insufficient documentation

## 2022-10-02 DIAGNOSIS — R918 Other nonspecific abnormal finding of lung field: Secondary | ICD-10-CM

## 2022-10-02 DIAGNOSIS — D509 Iron deficiency anemia, unspecified: Secondary | ICD-10-CM | POA: Insufficient documentation

## 2022-10-02 LAB — CMP (CANCER CENTER ONLY)
ALT: 27 U/L (ref 0–44)
AST: 29 U/L (ref 15–41)
Albumin: 3.9 g/dL (ref 3.5–5.0)
Alkaline Phosphatase: 87 U/L (ref 38–126)
Anion gap: 10 (ref 5–15)
BUN: 36 mg/dL — ABNORMAL HIGH (ref 6–20)
CO2: 23 mmol/L (ref 22–32)
Calcium: 9.4 mg/dL (ref 8.9–10.3)
Chloride: 105 mmol/L (ref 98–111)
Creatinine: 1.64 mg/dL — ABNORMAL HIGH (ref 0.61–1.24)
GFR, Estimated: 48 mL/min — ABNORMAL LOW (ref >60)
Glucose, Bld: 95 mg/dL (ref 70–99)
Potassium: 4.4 mmol/L (ref 3.5–5.1)
Sodium: 138 mmol/L (ref 135–145)
Total Bilirubin: 0.9 mg/dL (ref 0.3–1.2)
Total Protein: 6.9 g/dL (ref 6.5–8.1)

## 2022-10-02 LAB — CBC WITH DIFFERENTIAL (CANCER CENTER ONLY)
Abs Immature Granulocytes: 0.05 10*3/uL (ref 0.00–0.07)
Basophils Absolute: 0.1 10*3/uL (ref 0.0–0.1)
Basophils Relative: 1 %
Eosinophils Absolute: 0.2 10*3/uL (ref 0.0–0.5)
Eosinophils Relative: 2 %
HCT: 47 % (ref 39.0–52.0)
Hemoglobin: 15.2 g/dL (ref 13.0–17.0)
Immature Granulocytes: 1 %
Lymphocytes Relative: 15 %
Lymphs Abs: 1.3 10*3/uL (ref 0.7–4.0)
MCH: 29.5 pg (ref 26.0–34.0)
MCHC: 32.3 g/dL (ref 30.0–36.0)
MCV: 91.3 fL (ref 80.0–100.0)
Monocytes Absolute: 0.9 10*3/uL (ref 0.1–1.0)
Monocytes Relative: 10 %
Neutro Abs: 6.4 10*3/uL (ref 1.7–7.7)
Neutrophils Relative %: 71 %
Platelet Count: 162 10*3/uL (ref 150–400)
RBC: 5.15 MIL/uL (ref 4.22–5.81)
RDW: 13.2 % (ref 11.5–15.5)
WBC Count: 8.9 10*3/uL (ref 4.0–10.5)
nRBC: 0 % (ref 0.0–0.2)

## 2022-10-02 LAB — FERRITIN: Ferritin: 39 ng/mL (ref 24–336)

## 2022-10-02 LAB — IRON AND TIBC
Iron: 91 ug/dL (ref 45–182)
Saturation Ratios: 26 % (ref 17.9–39.5)
TIBC: 354 ug/dL (ref 250–450)
UIBC: 263 ug/dL

## 2022-10-02 LAB — LACTATE DEHYDROGENASE: LDH: 148 U/L (ref 98–192)

## 2022-10-02 NOTE — Telephone Encounter (Signed)
Pre Admit- 06/05/2022 between 1p-5p.  The patient is aware.  Nothing further needed.

## 2022-10-02 NOTE — Progress Notes (Signed)
Pt here for follow up. No new concerns voiced.   

## 2022-10-02 NOTE — Progress Notes (Signed)
Subjective:    Patient ID: Mark Donaldson, male    DOB: 29-Jun-1960, 62 y.o.   MRN: [id number]  Patient Care Team: [facility], [facility] as PCP - General (General Practice) [name], MD as Consulting Physician (Oncology)  Chief Complaint  Patient presents with   Follow-up   HPI [name] is a 62 year old lifelong never smoker with a very complex history as noted below who presents for follow up of nonspecific changes seen on CT chest and on moderate persistent asthma. He was initially seen here on 20 May 2022 at that time he was noted to have end expiratory wheezing and a nitric oxide of 31 ppb.  He was started on Trelegy Ellipta 100.  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  The patient underwent left nephrectomy in May 2021.  He has been followed by oncology and underwent 34 cycles of Keytruda (2 years treatment) and has not had any obvious signs of recurrence.  He has been on surveillance CTs.  CTs have shown persistent groundglass opacities/nodularity on the left lung consistent with infection/inflammation. The changes have been present since imaging performed November 2023.  Since his prior visit the patient does not note any fevers, chills or sweats.  No wheezing.  Previously he had a dry nonproductive cough but this has now resolved since he started Trelegy.  No hemoptysis.  He has not had dyspnea on exertion since he started Trelegy.  He is currently not on any oncologic medications.  He has not had any gastroesophageal reflux symptoms, no abdominal pain, nausea or vomiting.  No orthopnea or paroxysmal nocturnal dyspnea.  No lower extremity edema nor calf tenderness. Review of his laboratory data shows that over the last 7 months he has had a relative eosinophilia.   We last saw him on 23 September 2022 and at that time he had not had his surveillance CT.  He had surveillance CT on 12  September and a follow-up with oncology today.  On the follow-up CT it is noted that he has new nodularity on the left upper lobe and the areas where he had groundglass previously and new mediastinal adenopathy.  These findings are more consistent with metastatic renal cell carcinoma however, it is of note that the patient also has ANCA positive vasculitis and this adds the need for biopsy of these findings to determine etiology.  He presented today as a work in so we could discuss next steps with regards to these findings.     Review of Systems A 10 point review of systems was performed and it is as noted above otherwise negative.   Patient Active Problem List   Diagnosis Date Noted   Gastritis without bleeding 07/30/2022   Cough 06/03/2022   IDA (iron deficiency anemia) 11/21/2021   Avascular necrosis of bones of both hips (HCC) 06/20/2021   Transaminitis 01/17/2021   History of CHF (congestive heart failure) 05/01/2020   Hyperglycemia 02/28/2020   Stage 3b chronic kidney disease (HCC) 10/04/2019   Brain lesion 10/04/2019   Encounter for antineoplastic immunotherapy 07/19/2019   Encounter for antineoplastic chemotherapy 07/19/2019   CNS lesion 07/07/2019   Anemia in stage 3b chronic kidney disease (HCC) 06/07/2019   Goals of care, counseling/discussion 05/23/2019   Renal cell carcinoma of left kidney (HCC) 05/23/2019   Renal cell carcinoma (HCC) 05/23/2019   Palliative care encounter    History of vasculitis  Thoracic lymphadenopathy    Lung nodule    CAD S/P percutaneous coronary angioplasty 05/02/2019   Kidney mass 05/02/2019   Hydronephrosis of left kidney 05/02/2019   UTI (urinary tract infection) 05/02/2019   AKI (acute kidney injury) (HCC) 05/02/2019   Gross hematuria 05/02/2019   Chronic diastolic CHF (congestive heart failure) (HCC) 05/02/2019   Sepsis (HCC) 05/02/2019   Severe sepsis (HCC) 05/02/2019   Preop cardiovascular exam    Hyperlipidemia 05/21/2016    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   aspirin 81 MG EC tablet Take 81 mg by mouth daily. Swallow whole.   atorvastatin (LIPITOR) 80 MG tablet Take 80 mg by mouth daily.   clopidogrel (PLAVIX) 75 MG tablet Take 75 mg by mouth daily.   ferrous sulfate 325 (65 FE) MG tablet Take 1 tablet (325 mg total) by mouth 2 (two) times daily with a meal.   Fluticasone-Umeclidin-Vilant (TRELEGY ELLIPTA) 100-62.5-25 MCG/ACT AEPB Inhale 1 puff into the lungs daily.   furosemide (LASIX) 20 MG tablet Take 1 tablet (20 mg total) by mouth daily.   losartan (COZAAR) 50 MG tablet Take 50 mg by mouth daily.   metoprolol tartrate (LOPRESSOR) 100 MG tablet Take 1.5 tablets by mouth daily.    Immunization History  Administered Date(s) Administered   Influenza,inj,Quad PF,6+ Mos 11/02/2019   Pneumococcal Conjugate-13 01/14/2007        Objective:     BP 116/80 (BP Location: Left Arm, Patient Position: Sitting, Cuff Size: Normal)   Pulse 74   Temp 98 F (36.7 C) (Temporal)   Ht 5\' 6"  (1.676 m)   Wt 154 lb 14.4 oz (70.3 kg)   SpO2 97%   BMI 25.00 kg/m   SpO2: 97 %  GENERAL: Well-developed, well-nourished gentleman, no acute distress.  Fully ambulatory.  No conversational dyspnea. HEAD: Normocephalic, atraumatic.  EYES: Pupils equal, round, reactive to light.  No scleral icterus.  MOUTH: Dentition intact, oral mucosa moist.  No thrush. NECK: Supple. No thyromegaly.  Trachea midline. No JVD.  No adenopathy. PULMONARY: Good air entry bilaterally.  No adventitious sounds noted today. CARDIOVASCULAR: S1 and S2. Regular rate and rhythm.  No rubs, murmurs or gallops heard.   ABDOMEN: Benign. MUSCULOSKELETAL: No joint deformity, no clubbing, no edema.  NEUROLOGIC: No overt focal deficit, no gait disturbance, speech is fluent. SKIN: Intact,warm,dry. PSYCH: Mood and behavior normal.   Representative image from CT performed 25 September 2022 showing the nodularity on the left upper lobe:     Representative image from CT performed 25 September 2022 showing mediastinal adenopathy:     Assessment & Plan:     ICD-10-CM   1. Lung nodules  R91.8 CT SUPER D CHEST WO [facility] PILOT   Given history of renal cell carcinoma and Wegener's, needs biopsy Most appropriate procedure is robotic assisted bronchoscopy    2. Mediastinal adenopathy  R59.0    EBUS/TBNA during bronchoscopy Rule out renal cell  carcinoma recurrence    3. Renal cell carcinoma of left kidney [facility])  C64.2    This issue adds complexity to his management Had resection Completed Keytruda x 2 years    4. Vasculitis, 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HCC)  I77.82    This issue adds complexity to his management      Orders Placed This Encounter  Procedures   CT SUPER D CHEST WO [facility] PILOT    Standing Status:   Future    Standing Expiration Date:   10/02/2023    Scheduling Instructions:     Please schedule for 9/25- patient has PET scheudled that day.    Order Specific Question:   Preferred imaging location?    Answer:   [facility]   Benefits, limitations and potential complications of the procedure were discussed with the patient/family.  Complications from bronchoscopy are rare and most often minor, but if they occur they may include breathing difficulty, vocal cord spasm, hoarseness, slight fever, vomiting, dizziness, bronchospasm, infection, low blood oxygen, bleeding from biopsy site, or an  allergic reaction to medications.  It is uncommon for patients to experience other more serious complications for example: Collapsed lung requiring chest tube placement, respiratory failure, heart attack and/or cardiac arrhythmia.  Will proceed with robotic assisted navigational bronchoscopy and endobronchial ultrasound with TBNA on 10 October 2022.  The patient agrees to proceed with same.  Patient has previously been scheduled for follow-up appointment.  We will remain in contact with him before, during and after the procedure.   Gailen Shelter, MD Advanced Bronchoscopy PCCM [facility]    *This note was dictated using voice recognition software/Dragon.  Despite best efforts to proofread, errors can occur which can change the meaning. Any transcriptional errors that result from this process are unintentional and may not be fully corrected at the time of dictation.

## 2022-10-02 NOTE — Progress Notes (Signed)
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 S/p 34 cycles of Keytruda [ about 2 years's treatment]  09/2022 CT showed new left lung opacity/nodularity with new mediastinal adenopathy.  Will obtain PET scan,  Discussed case with pulmonology Dr. Jayme Cloud who will arrange him to get biopsy via bronchscopy  Stage 3b chronic kidney disease (HCC) avoid nephrotoxins. Encourage oral hydration  IDA (iron deficiency anemia) Labs are reviewed and discussed with patient. Lab Results  Component Value Date   HGB 15.2 10/02/2022   TIBC 354 10/02/2022   IRONPCTSAT 26 10/02/2022   FERRITIN 39 10/02/2022   S/p EGD and colonoscopy - diverticulosis, non bleeding internal hemorrhoids.  Gastritis. Hemoglobin and iron panel have both improved.  Orders Placed This Encounter  Procedures   NM PET Image Restag (PS) Skull Base To Thigh    Standing Status:   Future    Standing Expiration Date:   10/02/2023    Order Specific Question:   If indicated for the ordered procedure, I authorize the administration of a radiopharmaceutical per Radiology protocol    Answer:   Yes    Order Specific Question:   Preferred imaging location?    Answer:   [facility]   CMP (Cancer Center only)    Standing Status:   Future    Standing Expiration Date:   10/02/2023   CBC with Differential (Cancer Center Only)    Standing Status:   Future    Standing Expiration Date:   10/02/2023   Lactate dehydrogenase    Standing Status:   Future    Standing Expiration Date:   10/02/2023      Patient is Spanish-speaking.   Spanish  interpreter service utilized for entire encounter   Return visit  PET scan Follow-up in 3 months  All questions were answered. The patient knows to call the clinic with any problems, questions or concerns.  Mark Patience, MD, PhD [facility] Hematology Oncology 10/02/2022   PERTINENT HISTORY-  62 y.o. male presents for follow-up of renal cell carcinoma  Oncology History  Renal cell carcinoma of left kidney (HCC)  05/23/2019 Initial Diagnosis   Renal cell carcinoma of left kidney   -05/02/2019-05/07/2019 due to gross hematuria. CT showed a 7.4 cm left lower pole renal mass concerning for RCC.  Patient also has necrotic lymphadenopathy in the mediastinum and the right supra hilar region which were highly suspicious for metastatic disease.  Bilateral lung nodules, also concerning for metastatic lung disease.  Right retrocrural lymphadenopathy with upper normal left periaortic lymph node. Patient was recommended for cytoreductive radical nephrectomy.  With history of ANCA positive vasculitis, also recommend thoracic lymphadenopathy biopsy to confirm distal metastasis.  Patient agreed to radical nephrectomy and declined bronchoscopy biopsy. He underwent left radical nephrectomy on 05/05/2019. Pathology showed pT3a pNx, RCC, conventional clear cell type, grade 3    05/23/2019 Cancer Staging   Staging form: Kidney, AJCC 8th Edition - Pathologic: Stage IV (pT3a, pNX, cM1) - Signed by Mark Patience, MD on 05/23/2019   06/07/2019 -  Chemotherapy   Keytruda + Axitinib.  06/07/19 He started Axitinib 5mg  BID, stopped  on 07/25/2020 due to AKI, resumed on 10/04/2019, stopped again on 05/01/2020 due to diarrhea. 09/11/2020 resumed axitinib 5 mg twice daily, stopped on 10/23/2020 due to diarrhea.  01/17/2021 Keytruda was held due to transaminitis, immunotherapy induced liver toxicities. 02/20/2021, resumed Keytruda. Axitinib continues to be held.   01/12/2020 Imaging   CT showed overall marked improvement.  Comparing to  CT scan in August 2021, continues to have treatment response.Mediastinal soft tissue nodule nearly completely resolved, right minor fissure nodule 8 mm, decreased in size.  Resolution of pleural fluid and thickening of the right chest.  Small pleural effusion in the left chest.  No new lung nodules.-Subtle variation of the muscle architecture of the left psoas muscle of unknown significance   04/30/2020 Imaging   CT chest abdomen pelvis without contrast showed stable disease   11/28/2020 Imaging   CT abdomen pelvis without contrast showed stable examination.  No evidence of new or progressive disease in the chest abdomen pelvis.  Stable to minimally decreased size of 5 mm pulmonary nodule in the minor fissure in the right chest.  Sigmoid colon diverticulosis without findings of acute diverticulitis.   03/08/2021 Imaging   CT scan showed NED   07/31/2021 Imaging   CT chest abdomen pelvis showed stable disease   08/27/2021 Imaging   MRI brain with and without contrast Decrease in size of lesions presumably reflecting neurocysticercosis. No evidence new metastatic disease   08/29/2021 Imaging   CT Chest abdomen pelvis w contrast 1. No findings of active malignancy. 6 by 4 mm right upper lobe nodule along the major fissure is stable and likely represents a previously treated metastatic lesion. 2. Coronary artery atherosclerosis disproportionate to atherosclerosis elsewhere in the chest, abdomen, and pelvis. 3. Mild surgical bronchiectasis in both lower lobes. 4. Mild prostatomegaly. 5. 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 Status post left nephrectomy, without recurrent or metastatic disease. 2. Similar small right-sided pulmonary nodules. 3. New or progressive posterior left upper lobe clustered nodularity and ground-glass, suspicious for minimal infection. 4.  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  Patient has been referred to orthopedic surgeon in [place] which he is not able to go due to transportation.  He does not have insurance as well.  He is currently asymptomatic.  Monitor.  Patient has past medical history CAD status post PCI to LAD in 2018, on aspirin and Plavix, CHF, history of focal sclerosing/crescentic GN-ANCA positive and CKD stage III. 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 He finished 14 days of albendazole plus praziquantel .   He has had few side effects including poor appetite, hair loss, weight loss, bloody stool, abdominal cramping or diarrhea. All symptoms have improved since he completed the treatment. 03/16/2020 he finished tapering course of steroids  #Intermittent orthopnea,Pre-existing diastolic CHF, 05/04/2019 LVEF 60 to 65%.   Recommend him to follow-up with cardiology-he is has not able to follow-up with cardiology due to lack of insurance coverage.  # He has frequent urination at night.  Previously tried Cardura for BPH and had to stop due hypotension.  INTERVAL HISTORY Mark Donaldson is a 62 y.o. male who has above history reviewed by me today presents for follow up visit for  metastatic RCC Patient reports feeling well.  07/2022 S/p EGD and colonoscopy - diverticulosis, non bleeding internal hemorrhoids.  Gastritis.   Review of systems- Review of Systems  Constitutional:  Negative for appetite change, chills, fatigue, fever and unexpected weight change.  HENT:   Negative for voice change.   Eyes:  Negative for eye problems and icterus.  Respiratory:  Negative for chest tightness and cough.   Cardiovascular:  Negative for chest pain and leg swelling.  Gastrointestinal:  Negative for abdominal distention, abdominal pain and diarrhea.  Endocrine: Negative for  hot flashes.  Genitourinary:  Negative for difficulty urinating, dysuria, frequency and nocturia.   Musculoskeletal:  Positive for arthralgias.  Skin:  Negative for itching and rash.  Neurological:  Negative for light-headedness and numbness.  Hematological:  Negative for adenopathy. Does not bruise/bleed easily.  Psychiatric/Behavioral:  Negative for confusion.     No Known Allergies  Patient Active Problem List   Diagnosis Date Noted   Renal cell carcinoma of left kidney (HCC) 05/23/2019    Priority: High   IDA (iron deficiency anemia) 11/21/2021    Priority: Medium    Stage 3b chronic kidney disease (HCC) 10/04/2019    Priority: Medium    CNS lesion 07/07/2019    Priority: Medium    Encounter for antineoplastic immunotherapy 07/19/2019    Priority: Low   Encounter for antineoplastic chemotherapy 07/19/2019    Priority: Low   Goals of care, counseling/discussion 05/23/2019    Priority: Low   Gastritis without bleeding 07/30/2022   Cough 06/03/2022   Avascular necrosis of bones of both hips (HCC) 06/20/2021   Transaminitis 01/17/2021   History of CHF (congestive heart failure) 05/01/2020   Hyperglycemia 02/28/2020   Brain lesion 10/04/2019   Anemia in stage 3b chronic kidney disease (HCC) 06/07/2019   Renal cell carcinoma (HCC) 05/23/2019   Palliative care encounter    History of vasculitis    Thoracic lymphadenopathy    Lung nodule    CAD S/P percutaneous coronary angioplasty 05/02/2019   Kidney mass 05/02/2019   Hydronephrosis of left kidney 05/02/2019   UTI (urinary tract infection) 05/02/2019   AKI (acute kidney injury) (HCC) 05/02/2019   Gross hematuria 05/02/2019   Chronic diastolic CHF (congestive heart failure) (HCC) 05/02/2019   Sepsis (HCC) 05/02/2019   Severe sepsis (HCC) 05/02/2019   Preop cardiovascular exam    Hyperlipidemia 05/21/2016   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) 05/21/2016   Prediabetes 05/21/2016   Alcohol abuse  05/18/2016   Essential hypertension 05/18/2016   Vasculitis, 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HCC) 05/18/2016   Shortness of breath 05/17/2016     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HCC)    CAD (coronary artery disease)    Essential hypertension    HFrEF (heart failure with reduced ejection fraction) (HCC)    Hyperlipidemia LDL goal <70    Ischemic cardiomyopathy    Renal cell carcinoma (HCC) 05/23/2019   Renal disorder      Past Surgical History:  Procedure Laterality Date   BIOPSY  07/30/2022   Procedure: BIOPSY;  Surgeon: Midge Minium, MD;  Location: [facility] ENDOSCOPY;  Service: Endoscopy;;   CARDIAC SURGERY     cardiac cath with 2 stent placement   COLONOSCOPY WITH PROPOFOL N/A 07/30/2022   Procedure: COLONOSCOPY WITH PROPOFOL;  Surgeon: Midge Minium, MD;  Location: [facility] ENDOSCOPY;  Service: Endoscopy;  Laterality: N/A;  SPANISH INTERPRETER   ESOPHAGOGASTRODUODENOSCOPY (EGD) WITH PROPOFOL N/A 07/30/2022   Procedure: ESOPHAGOGASTRODUODENOSCOPY (  EGD) WITH PROPOFOL;  Surgeon: Midge Minium, MD;  Location: [facility] ENDOSCOPY;  Service: Endoscopy;  Laterality: N/A;   LAPAROSCOPIC NEPHRECTOMY, HAND ASSISTED Left 05/05/2019   Procedure: HAND ASSISTED LAPAROSCOPIC NEPHRECTOMY;  Surgeon: Vanna Scotland, MD;  Location: [facility] ORS;  Service: Urology;  Laterality: Left;    Social History   Socioeconomic History   Marital status: Married    Spouse name: Not on file   Number of children: Not on file   Years of education: Not on file   Highest education level: Not on file  Occupational History   Not on file  Tobacco Use   Smoking status: Never   Smokeless tobacco: Never  Vaping Use   Vaping status: Never Used  Substance and Sexual Activity   Alcohol use: Yes   Drug use: Not on file   Sexual activity: Not on file  Other Topics Concern   Not on file  Social History Narrative   Not on file   Social Determinants of Health   Financial Resource Strain: Not on file  Food  Insecurity: No Food Insecurity (07/14/2022)   [facility]   Hunger Vital Sign    Worried About Running Out of Food in the Last Year: Never true    Ran Out of Food in the Last Year: Never true  Transportation Needs: No Transportation Needs (07/14/2022)   [facility]   [facility] - Transportation    Lack of Transportation (Medical): No    Lack of Transportation (Non-Medical): No  Physical Activity: Not on file  Stress: Not on file  Social Connections: Not on file  Intimate Partner Violence: Not on file     Family History  Problem Relation Age of Onset   Cancer Mother    Blindness Brother      Current Outpatient Medications:    aspirin 81 MG EC tablet, Take 81 mg by mouth daily. Swallow whole., Disp: , Rfl:    atorvastatin (LIPITOR) 80 MG tablet, Take 80 mg by mouth daily., Disp: , Rfl:    clopidogrel (PLAVIX) 75 MG tablet, Take 75 mg by mouth daily., Disp: , Rfl:    ferrous sulfate 325 (65 FE) MG tablet, Take 1 tablet (325 mg total) by mouth 2 (two) times daily with a meal., Disp: 180 tablet, Rfl: 2   Fluticasone-Umeclidin-Vilant (TRELEGY ELLIPTA) 100-62.5-25 MCG/ACT AEPB, Inhale 1 puff into the lungs daily., Disp: 60 each, Rfl: 11   furosemide (LASIX) 20 MG tablet, Take 1 tablet (20 mg total) by mouth daily., Disp: 30 tablet, Rfl: 3   losartan (COZAAR) 50 MG tablet, Take 50 mg by mouth daily., Disp: , Rfl:    metoprolol tartrate (LOPRESSOR) 100 MG tablet, Take 1.5 tablets by mouth daily., Disp: , Rfl:    albuterol (VENTOLIN HFA) 108 (90 Base) MCG/ACT inhaler, Inhale 2 puffs into the lungs every 6 (six) hours as needed for wheezing or shortness of breath., Disp: 6.7 g, Rfl: 2   Physical exam:  Vitals:   10/02/22 1342  BP: (!) 131/98  Pulse: 73  Resp: 18  Temp: (!) 97.5 F (36.4 C)  Weight: 154 lb 14.4 oz (70.3 kg)    Physical Exam Constitutional:      General: He is not in acute distress.     Appearance: He is not diaphoretic.  HENT:     Head: Normocephalic and atraumatic.  Eyes:     General: No scleral icterus.    Pupils: Pupils are equal, round,  and reactive to light.  Cardiovascular:     Rate and Rhythm: Normal rate and regular rhythm.     Heart sounds: No murmur heard. Pulmonary:     Effort: Pulmonary effort is normal. No respiratory distress.  Chest:     Chest wall: No tenderness.  Abdominal:     General: There is no distension.     Palpations: Abdomen is soft.     Tenderness: There is no abdominal tenderness.  Musculoskeletal:        General: Normal range of motion.     Cervical back: Normal range of motion and neck supple.  Skin:    General: Skin is warm and dry.     Findings: No erythema.  Neurological:     Mental Status: He is alert and oriented to person, place, and time. Mental status is at baseline.     Cranial Nerves: No cranial nerve deficit.     Motor: No abnormal muscle tone.  Psychiatric:        Mood and Affect: Mood and affect normal.       Labs    Latest Ref Rng & Units 10/02/2022    1:20 PM 03/25/2022    1:18 PM 03/10/2022    1:20 PM  CBC  WBC 4.0 - 10.5 K/uL 8.9  10.4  10.5   Hemoglobin 13.0 - 17.0 g/dL 16.1  09.6  04.5   Hematocrit 39.0 - 52.0 % 47.0  41.5  41.8   Platelets 150 - 400 K/uL 162  172  176       Latest Ref Rng & Units 10/02/2022    1:20 PM 03/25/2022    1:18 PM 03/10/2022    1:23 PM  CMP  Glucose 70 - 99 mg/dL 95  409  99   BUN 6 - 20 mg/dL 36  40  26   Creatinine 0.61 - 1.24 mg/dL 8.11  9.14  7.82   Sodium 135 - 145 mmol/L 138  139  136   Potassium 3.5 - 5.1 mmol/L 4.4  4.4  4.2   Chloride 98 - 111 mmol/L 105  107  104   CO2 22 - 32 mmol/L 23  24  24    Calcium 8.9 - 10.3 mg/dL 9.4  9.2  8.8   Total Protein 6.5 - 8.1 g/dL 6.9  7.1    Total Bilirubin 0.3 - 1.2 mg/dL 0.9  0.7    Alkaline Phos 38 - 126 U/L 87  97    AST 15 - 41 U/L 29  30    ALT 0 - 44 U/L 27  21        RADIOGRAPHIC STUDIES: I have personally  reviewed the radiological images as listed and agreed with the findings in the report. CT CHEST ABDOMEN PELVIS WO CONTRAST  Result Date: 10/01/2022 CLINICAL DATA:  Renal cell carcinoma LEFT kidney. Status post 34 cycles immunotherapy. Assess treatment response. * Tracking Code: BO * EXAM: CT CHEST, ABDOMEN AND PELVIS WITHOUT CONTRAST TECHNIQUE: Multidetector CT imaging of the chest, abdomen and pelvis was performed following the standard protocol without IV contrast. RADIATION DOSE REDUCTION: This exam was performed according to the departmental dose-optimization program which includes automated exposure control, adjustment of the mA and/or kV according to patient size and/or use of iterative reconstruction technique. COMPARISON:  05/19/2022 FINDINGS: CT CHEST FINDINGS Cardiovascular: No significant vascular findings. Normal heart size. No pericardial effusion. Mediastinum/Nodes: Multiple newly enlarged mediastinal lymph nodes. For example prevascular lymph node measuring 20 mm (image  26/2) centrally new from prior. RIGHT lower paratracheal node measuring 20 mm increased from 5 mm. Lungs/Pleura: New nodularity in the LEFT upper lobe at site mild interstitial thickening. Nodule along the oblique fissure in the LEFT upper lobe measures 11 mm (image 52/4 nodule in the LEFT suprahilar lung adjacent to the pulmonary vasculature measures 10 mm (image 59/4. No RIGHT lung nodules. Musculoskeletal: No aggressive osseous lesion. CT ABDOMEN AND PELVIS FINDINGS Hepatobiliary: No focal hepatic lesion. No biliary ductal dilatation. Gallbladder is normal. Common bile duct is normal. Pancreas: Pancreas is normal. No ductal dilatation. No pancreatic inflammation. Spleen: Normal spleen Adrenals/urinary tract: Adrenal glands normal. Post LEFT nephrectomy. No nodularity in the nephrectomy bed. RIGHT kidney normal.  RIGHT ureter and bladder normal. Stomach/Bowel: Stomach, small bowel, appendix, and cecum are normal. The colon and  rectosigmoid colon are normal. Vascular/Lymphatic: Abdominal aorta is normal caliber. There is no retroperitoneal or periportal lymphadenopathy. No pelvic lymphadenopathy. Reproductive: Prostate normal Other: No free fluid. Musculoskeletal: No aggressive osseous lesion. IMPRESSION: CHEST: 1. New nodularity in the LEFT upper lobe with new mediastinal lymphadenopathy. 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 2. No skeletal metastasis. PELVIS: 1. No evidence of local recurrence in the LEFT nephrectomy bed. 2. No evidence of metastatic disease in the abdomen pelvis. Electronically Signed   By: Genevive Bi M.D.   On: 10/01/2022 11:30

## 2022-10-02 NOTE — Assessment & Plan Note (Signed)
avoid nephrotoxins. Encourage oral hydration

## 2022-10-02 NOTE — Telephone Encounter (Signed)
Robotic Bronchoscopy with EBUS 9/27 at 12:30pm Left Lung Nodule and Mediastinal Adenopathy  [id number], [id number], [phone]  Patient does not need approval. He is part of the [facility].  Holding message for Pre-Admit appointment.

## 2022-10-02 NOTE — Assessment & Plan Note (Addendum)
Labs are reviewed and discussed with patient. Lab Results  Component Value Date   HGB 15.2 10/02/2022   TIBC 354 10/02/2022   IRONPCTSAT 26 10/02/2022   FERRITIN 39 10/02/2022   7/2024S/p EGD and colonoscopy - diverticulosis, non bleeding internal hemorrhoids.  Gastritis. Hemoglobin and iron panel have both improved.

## 2022-10-02 NOTE — Assessment & Plan Note (Addendum)
Stage IV renal cell carcinoma, Previously did not tolerate axitinib. S/p 34 cycles of Keytruda [ about 2 years's treatment]  09/2022 CT showed new left lung opacity/nodularity with new mediastinal adenopathy.  Will obtain PET scan,  Discussed case with pulmonology Dr. Jayme Cloud who will arrange him to get biopsy via bronchscopy

## 2022-10-02 NOTE — H&P (View-Only) (Signed)
Subjective:    Patient ID: Mark Donaldson, male    DOB: 29-Jun-1960, 62 y.o.   MRN: [id number]  Patient Care Team: [facility], [facility] as PCP - General (General Practice) [name], MD as Consulting Physician (Oncology)  Chief Complaint  Patient presents with   Follow-up   HPI Mark Donaldson is a 62 year old lifelong never smoker with a very complex history as noted below who presents for follow up of nonspecific changes seen on CT chest and on moderate persistent asthma. He was initially seen here on 20 May 2022 at that time he was noted to have end expiratory wheezing and a nitric oxide of 31 ppb.  He was started on Trelegy Ellipta 100.  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  The patient underwent left nephrectomy in May 2021.  He has been followed by oncology and underwent 34 cycles of Keytruda (2 years treatment) and has not had any obvious signs of recurrence.  He has been on surveillance CTs.  CTs have shown persistent groundglass opacities/nodularity on the left lung consistent with infection/inflammation. The changes have been present since imaging performed November 2023.  Since his prior visit the patient does not note any fevers, chills or sweats.  No wheezing.  Previously he had a dry nonproductive cough but this has now resolved since he started Trelegy.  No hemoptysis.  He has not had dyspnea on exertion since he started Trelegy.  He is currently not on any oncologic medications.  He has not had any gastroesophageal reflux symptoms, no abdominal pain, nausea or vomiting.  No orthopnea or paroxysmal nocturnal dyspnea.  No lower extremity edema nor calf tenderness. Review of his laboratory data shows that over the last 7 months he has had a relative eosinophilia.   We last saw him on 23 September 2022 and at that time he had not had his surveillance CT.  He had surveillance CT on 12  September and a follow-up with oncology today.  On the follow-up CT it is noted that he has new nodularity on the left upper lobe and the areas where he had groundglass previously and new mediastinal adenopathy.  These findings are more consistent with metastatic renal cell carcinoma however, it is of note that the patient also has ANCA positive vasculitis and this adds the need for biopsy of these findings to determine etiology.  He presented today as a work in so we could discuss next steps with regards to these findings.     Review of Systems A 10 point review of systems was performed and it is as noted above otherwise negative.   Patient Active Problem List   Diagnosis Date Noted   Gastritis without bleeding 07/30/2022   Cough 06/03/2022   IDA (iron deficiency anemia) 11/21/2021   Avascular necrosis of bones of both hips (HCC) 06/20/2021   Transaminitis 01/17/2021   History of CHF (congestive heart failure) 05/01/2020   Hyperglycemia 02/28/2020   Stage 3b chronic kidney disease (HCC) 10/04/2019   Brain lesion 10/04/2019   Encounter for antineoplastic immunotherapy 07/19/2019   Encounter for antineoplastic chemotherapy 07/19/2019   CNS lesion 07/07/2019   Anemia in stage 3b chronic kidney disease (HCC) 06/07/2019   Goals of care, counseling/discussion 05/23/2019   Renal cell carcinoma of left kidney (HCC) 05/23/2019   Renal cell carcinoma (HCC) 05/23/2019   Palliative care encounter    History of vasculitis  Thoracic lymphadenopathy    Lung nodule    CAD S/P percutaneous coronary angioplasty 05/02/2019   Kidney mass 05/02/2019   Hydronephrosis of left kidney 05/02/2019   UTI (urinary tract infection) 05/02/2019   AKI (acute kidney injury) (HCC) 05/02/2019   Gross hematuria 05/02/2019   Chronic diastolic CHF (congestive heart failure) (HCC) 05/02/2019   Sepsis (HCC) 05/02/2019   Severe sepsis (HCC) 05/02/2019   Preop cardiovascular exam    Hyperlipidemia 05/21/2016    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   aspirin 81 MG EC tablet Take 81 mg by mouth daily. Swallow whole.   atorvastatin (LIPITOR) 80 MG tablet Take 80 mg by mouth daily.   clopidogrel (PLAVIX) 75 MG tablet Take 75 mg by mouth daily.   ferrous sulfate 325 (65 FE) MG tablet Take 1 tablet (325 mg total) by mouth 2 (two) times daily with a meal.   Fluticasone-Umeclidin-Vilant (TRELEGY ELLIPTA) 100-62.5-25 MCG/ACT AEPB Inhale 1 puff into the lungs daily.   furosemide (LASIX) 20 MG tablet Take 1 tablet (20 mg total) by mouth daily.   losartan (COZAAR) 50 MG tablet Take 50 mg by mouth daily.   metoprolol tartrate (LOPRESSOR) 100 MG tablet Take 1.5 tablets by mouth daily.    Immunization History  Administered Date(s) Administered   Influenza,inj,Quad PF,6+ Mos 11/02/2019   Pneumococcal Conjugate-13 01/14/2007        Objective:     BP 116/80 (BP Location: Left Arm, Patient Position: Sitting, Cuff Size: Normal)   Pulse 74   Temp 98 F (36.7 C) (Temporal)   Ht 5\' 6"  (1.676 m)   Wt 154 lb 14.4 oz (70.3 kg)   SpO2 97%   BMI 25.00 kg/m   SpO2: 97 %  GENERAL: Well-developed, well-nourished gentleman, no acute distress.  Fully ambulatory.  No conversational dyspnea. HEAD: Normocephalic, atraumatic.  EYES: Pupils equal, round, reactive to light.  No scleral icterus.  MOUTH: Dentition intact, oral mucosa moist.  No thrush. NECK: Supple. No thyromegaly.  Trachea midline. No JVD.  No adenopathy. PULMONARY: Good air entry bilaterally.  No adventitious sounds noted today. CARDIOVASCULAR: S1 and S2. Regular rate and rhythm.  No rubs, murmurs or gallops heard.   ABDOMEN: Benign. MUSCULOSKELETAL: No joint deformity, no clubbing, no edema.  NEUROLOGIC: No overt focal deficit, no gait disturbance, speech is fluent. SKIN: Intact,warm,dry. PSYCH: Mood and behavior normal.   Representative image from CT performed 25 September 2022 showing the nodularity on the left upper lobe:     Representative image from CT performed 25 September 2022 showing mediastinal adenopathy:     Assessment & Plan:     ICD-10-CM   1. Lung nodules  R91.8 CT SUPER D CHEST WO MONARCH PILOT   Given history of renal cell carcinoma and Wegener's, needs biopsy Most appropriate procedure is robotic assisted bronchoscopy    2. Mediastinal adenopathy  R59.0    EBUS/TBNA during bronchoscopy Rule out renal cell  carcinoma recurrence    3. Renal cell carcinoma of left kidney [facility])  C64.2    This issue adds complexity to his management Had resection Completed Keytruda x 2 years    4. Vasculitis, 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HCC)  I77.82    This issue adds complexity to his management      Orders Placed This Encounter  Procedures   CT SUPER D CHEST WO MONARCH PILOT    Standing Status:   Future    Standing Expiration Date:   10/02/2023    Scheduling Instructions:     Please schedule for 9/25- patient has PET scheudled that day.    Order Specific Question:   Preferred imaging location?    Answer:   [facility]   Benefits, limitations and potential complications of the procedure were discussed with the patient/family.  Complications from bronchoscopy are rare and most often minor, but if they occur they may include breathing difficulty, vocal cord spasm, hoarseness, slight fever, vomiting, dizziness, bronchospasm, infection, low blood oxygen, bleeding from biopsy site, or an  allergic reaction to medications.  It is uncommon for patients to experience other more serious complications for example: Collapsed lung requiring chest tube placement, respiratory failure, heart attack and/or cardiac arrhythmia.  Will proceed with robotic assisted navigational bronchoscopy and endobronchial ultrasound with TBNA on 10 October 2022.  The patient agrees to proceed with same.  Patient has previously been scheduled for follow-up appointment.  We will remain in contact with him before, during and after the procedure.   Gailen Shelter, MD Advanced Bronchoscopy PCCM [facility]    *This note was dictated using voice recognition software/Dragon.  Despite best efforts to proofread, errors can occur which can change the meaning. Any transcriptional errors that result from this process are unintentional and may not be fully corrected at the time of dictation.

## 2022-10-02 NOTE — Patient Instructions (Addendum)
We discussed that you have some nodules in your lung that we need to biopsy.  These may be related to the prior tumor you had in your kidney or to the vasculitis that you have (Wegener's) so it is important we biopsy them to determine how to treat them.  You have a PET/CT scheduled for 25 September (Wednesday) at 9 AM.  We are going to obtain a special CT of the chest at that time as well.  You will receive a call from the [facility] prior to the procedure to get your medical history.  Will call on Monday, 23 September between the hours of 1 and 5 PM  Your procedure has been scheduled for 27 September (Friday) at 12:30 PM however you must be in the hospital by no later than 11:30 PM.  Nothing to eat or drink from midnight the night prior to the procedure.  We discussed that the procedure would have to be done under general anesthesia. The anesthesia team will discuss his part of the process with you.  Complications from the procedure itself are usually minor. One potential complication would be collapse of the lung which can occur in 2-3% of the cases. If  this happens we would have to put a small tube to relieve the collapse and you would have to spend the night in the hospital in that event.  Another possibility would be that of bleeding, this is usually taken care of during the procedure.  In the situations you may need to be observed overnight.  For the most part though, should be able to go home the same day.  Other possibilities could include that the procedure would be nondiagnostic meaning that no definitive diagnosis could be attained.  We strive to try to decrease these potential issues.  You already have a follow-up set up with me.  We will be in contact with you after the procedure to let you know the results.   Hablamos de que tiene algunos ndulos en el pulmn que necesitamos realizar una biopsia.  Estos pueden estar relacionados con el tumor previo que tuvo en su rin o  con la vasculitis que tiene (de Wegener), por lo que es importante que les hagamos una biopsia para determinar cmo tratarlos.  [place] PET/CT programada para el da 25 de septiembre (mircoles) a las 9:00 horas.  En ese momento tambin vamos a obtener una tomografa computarizada especial del trax.  Recibir una llamada de la [place] de pruebas previas a la admisin antes del procedimiento para obtener su historial mdico. La llamada va ha ser el Lunes 23 de Septiembre y llamaran entre las horas de 1-5 PM.  Su procedimiento est programado para el 27 de septiembre (viernes) a las 12:30 p. m.; sin embargo, debe estar en el hospital a ms tardar a las 11:30 a. m.  Nada de comer ni beber a Glass blower/designer de la medianoche del da anterior al procedimiento.  Discutimos que el procedimiento tendra que realizarse bajo anestesia general. El equipo de anestesia discutir con usted su parte del proceso.  Las complicaciones del procedimiento en s suelen ser menores. Una posible complicacin sera el colapso del pulmn, que puede ocurrir en el 2-3% de los [place]. Si esto sucede tendramos que ponerle un pequeo tubo para aliviar el colapso y tendra que pasar la noche en el hospital en ese caso.  Otra posibilidad sera la de sangrado, esto suele solucionarse durante el procedimiento.  En estas situaciones, es posible que necesite  permanecer en observacin durante la noche.  Sin embargo, en su mayor parte deberan poder volver a Charity fundraiser.  Otras posibilidades podran incluir que el procedimiento no sea diagnstico, lo que significa que no se podra lograr un diagnstico definitivo.  Nos esforzamos por intentar disminuir estos problemas potenciales.  Ya tienes un seguimiento configurado conmigo.  Nos pondremos en contacto con usted despus del procedimiento para informarle los resultados.

## 2022-10-03 ENCOUNTER — Encounter: Payer: Self-pay | Admitting: Oncology

## 2022-10-03 NOTE — Addendum Note (Signed)
Addended by: [name] on: 10/03/2022 10:28 PM   Modules accepted: Orders

## 2022-10-06 ENCOUNTER — Other Ambulatory Visit: Payer: Self-pay

## 2022-10-06 ENCOUNTER — Encounter
Admission: RE | Admit: 2022-10-06 | Discharge: 2022-10-06 | Disposition: A | Discharge status: Home / Self Care | Payer: Self-pay | Source: Ambulatory Visit | Attending: Pulmonary Disease | Admitting: Pulmonary Disease

## 2022-10-06 VITALS — Ht 66.0 in | Wt 157.0 lb

## 2022-10-06 DIAGNOSIS — I10 Essential (primary) hypertension: Secondary | ICD-10-CM

## 2022-10-06 DIAGNOSIS — Z8679 Personal history of other diseases of the circulatory system: Secondary | ICD-10-CM

## 2022-10-06 DIAGNOSIS — Z01812 Encounter for preprocedural laboratory examination: Secondary | ICD-10-CM

## 2022-10-06 DIAGNOSIS — N1832 Chronic kidney disease, stage 3b: Secondary | ICD-10-CM

## 2022-10-06 HISTORY — DX: Sepsis, unspecified organism: A41.9

## 2022-10-06 HISTORY — DX: Idiopathic aseptic necrosis of right femur: M87.051

## 2022-10-06 HISTORY — DX: Gastritis, unspecified, without bleeding: K29.70

## 2022-10-06 HISTORY — DX: Iron deficiency anemia, unspecified: D50.9

## 2022-10-06 HISTORY — DX: Chronic kidney disease, stage 3b: N18.32

## 2022-10-06 HISTORY — DX: Severe sepsis without septic shock: R65.20

## 2022-10-06 NOTE — Pre-Procedure Instructions (Signed)
Secure chat to Dr. Jayme Cloud regarding when patient is to stop taking plavix and aspirin before bronchoscopy this Friday, September 27. Responded to stop today, Monday, September 23. Patient made aware of this during PAT phone interview.

## 2022-10-06 NOTE — Patient Instructions (Addendum)
Su trmite est programado para el: viernes 27 de septiembre [place] en el mostrador de Tax adviser del [facility] [facility] Para saber su hora de llegada, llame al [phone] entre la 1:00 p. m. y las 3:00 p. m. el jueves 26 de septiembre. Si su hora de llegada es a las 6:00 am, no llegue antes de esa hora ya que las puertas de [place] del [facility] no se abren hasta las 6:00 am.  RECORDAR: Las instrucciones que no se siguen completamente pueden generar riesgos mdicos graves, que pueden llegar hasta la [place]; o, segn el criterio de su cirujano y Scientific laboratory technician, es posible que sea Aeronautical engineer su Leisure centre manager.  No coma ni beba despus de la medianoche del da anterior a la ciruga.  No mascar chicle ni caramelos duros. Una semana antes de la ciruga: a partir de hoy 23 de septiembre Detenga los antiinflamatorios (AINE) como Advil, Aleve, Ibuprofeno, Motrin, Naproxen, Naprosyn y productos a base de aspirina como Excedrin, Goody's Powder, BC Powder. Suspenda CUALQUIER suplemento de venta libre hasta despus de la [place]. Sin embargo, puede Educational psychologist tomando Tylenol si es necesario para Marketing executive de la [place].  Contine tomando todos los medicamentos recetados, excepto los siguientes:  Segn el Dr. Lelon Mast: suspenda la aspirina y el clopidorel hoy, 23 de septiembre. Reanude DESPUS de la ciruga segn las instrucciones del Dr. Lelon Mast.  TOME SLO ESTOS MEDICAMENTOS LA MAANA DE LA CIRUGA CON UN SORBO DE AGUA:  1. Inhalador Trelegy 2. metoprolol  No consumir alcohol durante 24 horas antes o despus de la [place].  No fumar, incluidos los cigarrillos electrnicos, durante las 24 horas previas a la [place].  No consumir productos de tabaco masticables durante al menos 6 horas antes de la [place].  Sin parches de Optometrist de la [place].  No use ningn medicamento "recreativo" durante al menos una semana (preferiblemente 2 semanas) antes de la  ciruga.  Tenga en cuenta que la combinacin de cocana y anestesia puede [employer], que pueden llegar hasta la [place]. Si su prueba de cocana da positivo, su ciruga ser cancelada.  La maana de la ciruga cepille sus dientes con pasta dental y agua, puede enjuagarse la boca con enjuague bucal si lo desea. No ingiera pasta de dientes ni enjuague bucal.  No uses joyas.  No use lociones, polvos ni perfumes.   No se afeite el vello corporal desde el cuello hacia abajo 48 horas antes de la [place].  No se pueden usar lentes de contacto, audfonos ni dentaduras postizas durante la [place].  No lleve objetos de valor al hospital. [facility] no es responsable de ninguna pertenencia u objeto de valor perdido o perdido.   Notifique a su mdico si hay algn cambio en su condicin mdica (resfriado, fiebre, infeccin).  Lleve ropa cmoda (especfica para su tipo de [place]) al hospital.  Despus de la ciruga, usted puede ayudar a prevenir complicaciones pulmonares haciendo ejercicios de respiracin.  Respire profundamente y tosa cada 1 o 2 horas.   Si le dan el alta el da de la [place], no se le permitir conducir a casa. Necesitar que una persona responsable lo lleve a su casa y se quede con usted durante las 24 horas posteriores a la [place].   Si viaja en transporte pblico, deber ir acompaado de una persona responsable.  Llame al Departamento de pruebas previas a la admisin al [phone] si tiene alguna pregunta sobre estas instrucciones.  Poltica de  visitas a ciruga:  [employer] se someten a [place] o un procedimiento pueden [employer] visitantes.  Los nios menores de 16 aos deben estar acompaados por un adulto que no sea el [place].     Your procedure is scheduled on: Friday, September 27 Report to the Registration Desk on the 1st floor of the [facility] To find out your arrival time, please call [phone] between 1PM - 3PM  on: Thursday, September 26 If your arrival time is 6:00 am, do not arrive before that time as the [facility] Mall entrance doors do not open until 6:00 am.  REMEMBER: Instructions that are not followed completely may result in serious medical risk, up to and including death; or upon the discretion of your surgeon and anesthesiologist your surgery may need to be rescheduled.  Do not eat or drink after midnight the night before surgery.  No gum chewing or hard candies.  One week prior to surgery: starting today, September 23 Stop Anti-inflammatories (NSAIDS) such as Advil, Aleve, Ibuprofen, Motrin, Naproxen, Naprosyn and Aspirin based products such as Excedrin, Goody's Powder, BC Powder. Stop ANY OVER THE COUNTER supplements until after surgery. You may however, continue to take Tylenol if needed for pain up until the day of surgery.  Continue taking all prescribed medications with the exception of the following:  Per Dr. Jayme Cloud - stop aspirin and clopidorel today, September 23. Resume AFTER surgery per Dr. Jayme Cloud instruction.  TAKE ONLY THESE MEDICATIONS THE MORNING OF SURGERY WITH A SIP OF WATER:  Trelegy inhaler Metoprolol  No Alcohol for 24 hours before or after surgery.  No Smoking including e-cigarettes for 24 hours before surgery.  No chewable tobacco products for at least 6 hours before surgery.  No nicotine patches on the day of surgery.  Do not use any "recreational" drugs for at least a week (preferably 2 weeks) before your surgery.  Please be advised that the combination of cocaine and anesthesia may have negative outcomes, up to and including death. If you test positive for cocaine, your surgery will be cancelled.  On the morning of surgery brush your teeth with toothpaste and water, you may rinse your mouth with mouthwash if you wish. Do not swallow any toothpaste or mouthwash.  Do not wear jewelry.  Do not wear lotions, powders, or perfumes.   Do not shave body  hair from the neck down 48 hours before surgery.  Contact lenses, hearing aids and dentures may not be worn into surgery.  Do not bring valuables to the hospital. [facility] is not responsible for any missing/lost belongings or valuables.   Notify your doctor if there is any change in your medical condition (cold, fever, infection).  Wear comfortable clothing (specific to your surgery type) to the hospital.  After surgery, you can help prevent lung complications by doing breathing exercises.  Take deep breaths and cough every 1-2 hours.   If you are being discharged the day of surgery, you will not be allowed to drive home. You will need a responsible individual to drive you home and stay with you for 24 hours after surgery.   If you are taking public transportation, you will need to have a responsible individual with you.  Please call the Pre-admissions Testing Dept. at [phone] if you have any questions about these instructions.  Surgery Visitation Policy:  Patients having surgery or a procedure may have two visitors.  Children under the age of 65 must have an adult with  them who is not the patient.

## 2022-10-08 ENCOUNTER — Ambulatory Visit
Admission: RE | Admit: 2022-10-08 | Discharge: 2022-10-08 | Disposition: A | Discharge status: Home / Self Care | Payer: Self-pay | Source: Ambulatory Visit | Attending: Oncology | Admitting: Oncology

## 2022-10-08 ENCOUNTER — Ambulatory Visit
Admission: RE | Admit: 2022-10-08 | Discharge: 2022-10-08 | Disposition: A | Discharge status: Home / Self Care | Payer: Self-pay | Source: Ambulatory Visit | Attending: Pulmonary Disease | Admitting: Pulmonary Disease

## 2022-10-08 ENCOUNTER — Encounter
Admission: RE | Admit: 2022-10-08 | Discharge: 2022-10-08 | Disposition: A | Discharge status: Home / Self Care | Payer: Self-pay | Source: Ambulatory Visit | Attending: Pulmonary Disease | Admitting: Pulmonary Disease

## 2022-10-08 DIAGNOSIS — Z01812 Encounter for preprocedural laboratory examination: Secondary | ICD-10-CM

## 2022-10-08 DIAGNOSIS — C642 Malignant neoplasm of left kidney, except renal pelvis: Secondary | ICD-10-CM | POA: Insufficient documentation

## 2022-10-08 DIAGNOSIS — N1832 Chronic kidney disease, stage 3b: Secondary | ICD-10-CM | POA: Insufficient documentation

## 2022-10-08 DIAGNOSIS — I10 Essential (primary) hypertension: Secondary | ICD-10-CM | POA: Insufficient documentation

## 2022-10-08 DIAGNOSIS — R590 Localized enlarged lymph nodes: Secondary | ICD-10-CM | POA: Insufficient documentation

## 2022-10-08 DIAGNOSIS — Z8679 Personal history of other diseases of the circulatory system: Secondary | ICD-10-CM | POA: Insufficient documentation

## 2022-10-08 DIAGNOSIS — R918 Other nonspecific abnormal finding of lung field: Secondary | ICD-10-CM | POA: Insufficient documentation

## 2022-10-08 DIAGNOSIS — M879 Osteonecrosis, unspecified: Secondary | ICD-10-CM | POA: Insufficient documentation

## 2022-10-08 LAB — GLUCOSE, CAPILLARY: Glucose-Capillary: 81 mg/dL (ref 70–99)

## 2022-10-08 MED ORDER — FLUDEOXYGLUCOSE F - 18 (FDG) INJECTION
8.0000 | Freq: Once | INTRAVENOUS | Status: AC | PRN
  Administered 2022-10-08: 8 via INTRAVENOUS

## 2022-10-09 MED ORDER — ORAL CARE MOUTH RINSE: 15.0000 mL | Freq: Once | OROMUCOSAL | Status: AC

## 2022-10-09 MED ORDER — LACTATED RINGERS IV SOLN: INTRAVENOUS | Status: DC

## 2022-10-09 MED ORDER — IPRATROPIUM-ALBUTEROL 0.5-2.5 (3) MG/3ML IN SOLN
3.0000 mL | Freq: Once | RESPIRATORY_TRACT | Status: AC
  Administered 2022-10-10: 3 mL via RESPIRATORY_TRACT

## 2022-10-09 MED ORDER — CHLORHEXIDINE GLUCONATE 0.12 % MT SOLN
15.0000 mL | Freq: Once | OROMUCOSAL | Status: AC
  Administered 2022-10-10: 15 mL via OROMUCOSAL

## 2022-10-09 MED ORDER — SODIUM CHLORIDE 0.9 % IV SOLN: Freq: Once | INTRAVENOUS | Status: DC

## 2022-10-09 MED ORDER — FAMOTIDINE 20 MG PO TABS
20.0000 mg | ORAL_TABLET | Freq: Once | ORAL | Status: AC
  Administered 2022-10-10: 20 mg via ORAL

## 2022-10-10 ENCOUNTER — Ambulatory Visit
Admission: RE | Admit: 2022-10-10 | Discharge: 2022-10-10 | Disposition: A | Discharge status: Home / Self Care | Payer: Self-pay | Attending: Pulmonary Disease | Admitting: Pulmonary Disease

## 2022-10-10 ENCOUNTER — Ambulatory Visit: Payer: Self-pay

## 2022-10-10 ENCOUNTER — Other Ambulatory Visit: Payer: Self-pay

## 2022-10-10 ENCOUNTER — Ambulatory Visit: Payer: Self-pay | Admitting: Urgent Care

## 2022-10-10 ENCOUNTER — Encounter
Admission: RE | Disposition: A | Discharge status: Home / Self Care | Payer: Self-pay | Source: Home / Self Care | Attending: Pulmonary Disease

## 2022-10-10 ENCOUNTER — Encounter: Payer: Self-pay | Admitting: Pulmonary Disease

## 2022-10-10 DIAGNOSIS — I7782 Antineutrophilic cytoplasmic antibody (ANCA) vasculitis: Secondary | ICD-10-CM | POA: Insufficient documentation

## 2022-10-10 DIAGNOSIS — I2510 Atherosclerotic heart disease of native coronary artery without angina pectoris: Secondary | ICD-10-CM | POA: Insufficient documentation

## 2022-10-10 DIAGNOSIS — Z85528 Personal history of other malignant neoplasm of kidney: Secondary | ICD-10-CM | POA: Insufficient documentation

## 2022-10-10 DIAGNOSIS — C7802 Secondary malignant neoplasm of left lung: Secondary | ICD-10-CM | POA: Insufficient documentation

## 2022-10-10 DIAGNOSIS — Z905 Acquired absence of kidney: Secondary | ICD-10-CM | POA: Insufficient documentation

## 2022-10-10 DIAGNOSIS — J4540 Moderate persistent asthma, uncomplicated: Secondary | ICD-10-CM | POA: Insufficient documentation

## 2022-10-10 DIAGNOSIS — R918 Other nonspecific abnormal finding of lung field: Secondary | ICD-10-CM | POA: Diagnosis present

## 2022-10-10 DIAGNOSIS — R590 Localized enlarged lymph nodes: Secondary | ICD-10-CM

## 2022-10-10 DIAGNOSIS — E785 Hyperlipidemia, unspecified: Secondary | ICD-10-CM | POA: Insufficient documentation

## 2022-10-10 DIAGNOSIS — N1832 Chronic kidney disease, stage 3b: Secondary | ICD-10-CM | POA: Insufficient documentation

## 2022-10-10 DIAGNOSIS — C642 Malignant neoplasm of left kidney, except renal pelvis: Secondary | ICD-10-CM

## 2022-10-10 DIAGNOSIS — I130 Hypertensive heart and chronic kidney disease with heart failure and stage 1 through stage 4 chronic kidney disease, or unspecified chronic kidney disease: Secondary | ICD-10-CM | POA: Insufficient documentation

## 2022-10-10 DIAGNOSIS — I255 Ischemic cardiomyopathy: Secondary | ICD-10-CM | POA: Insufficient documentation

## 2022-10-10 DIAGNOSIS — I5032 Chronic diastolic (congestive) heart failure: Secondary | ICD-10-CM | POA: Insufficient documentation

## 2022-10-10 DIAGNOSIS — C771 Secondary and unspecified malignant neoplasm of intrathoracic lymph nodes: Secondary | ICD-10-CM | POA: Insufficient documentation

## 2022-10-10 DIAGNOSIS — Z8679 Personal history of other diseases of the circulatory system: Secondary | ICD-10-CM

## 2022-10-10 DIAGNOSIS — Z79899 Other long term (current) drug therapy: Secondary | ICD-10-CM | POA: Insufficient documentation

## 2022-10-10 HISTORY — PX: ENDOBRONCHIAL ULTRASOUND: SHX5096

## 2022-10-10 SURGERY — BRONCHOSCOPY, WITH BIOPSY USING ELECTROMAGNETIC NAVIGATION
Anesthesia: General | Laterality: Left

## 2022-10-10 MED ORDER — PROPOFOL 10 MG/ML IV BOLUS
INTRAVENOUS | Status: DC | PRN
  Administered 2022-10-10: 120 mg via INTRAVENOUS

## 2022-10-10 MED ORDER — CHLORHEXIDINE GLUCONATE 0.12 % MT SOLN
OROMUCOSAL | Status: AC
  Filled 2022-10-10: qty 15

## 2022-10-10 MED ORDER — FENTANYL CITRATE (PF) 100 MCG/2ML IJ SOLN: 25.0000 ug | INTRAMUSCULAR | Status: DC | PRN

## 2022-10-10 MED ORDER — FENTANYL CITRATE (PF) 100 MCG/2ML IJ SOLN
INTRAMUSCULAR | Status: AC
  Filled 2022-10-10: qty 2

## 2022-10-10 MED ORDER — PROPOFOL 10 MG/ML IV BOLUS
INTRAVENOUS | Status: AC
  Filled 2022-10-10: qty 20

## 2022-10-10 MED ORDER — ROCURONIUM BROMIDE 100 MG/10ML IV SOLN
INTRAVENOUS | Status: DC | PRN
  Administered 2022-10-10: 50 mg via INTRAVENOUS

## 2022-10-10 MED ORDER — PHENYLEPHRINE 80 MCG/ML (10ML) SYRINGE FOR IV PUSH (FOR BLOOD PRESSURE SUPPORT)
PREFILLED_SYRINGE | INTRAVENOUS | Status: DC | PRN
Start: 2022-10-10 — End: 2022-10-13
  Administered 2022-10-10 (×6): 80 ug via INTRAVENOUS

## 2022-10-10 MED ORDER — LIDOCAINE HCL (CARDIAC) PF 100 MG/5ML IV SOSY
PREFILLED_SYRINGE | INTRAVENOUS | Status: DC | PRN
  Administered 2022-10-10: 100 mg via INTRAVENOUS

## 2022-10-10 MED ORDER — MIDAZOLAM HCL 2 MG/2ML IJ SOLN
INTRAMUSCULAR | Status: AC
  Filled 2022-10-10: qty 2

## 2022-10-10 MED ORDER — ONDANSETRON HCL 4 MG/2ML IJ SOLN: 4.0000 mg | Freq: Once | INTRAMUSCULAR | Status: DC | PRN

## 2022-10-10 MED ORDER — IPRATROPIUM-ALBUTEROL 0.5-2.5 (3) MG/3ML IN SOLN
RESPIRATORY_TRACT | Status: AC
  Filled 2022-10-10: qty 3

## 2022-10-10 MED ORDER — PROPOFOL 1000 MG/100ML IV EMUL
INTRAVENOUS | Status: AC
  Filled 2022-10-10: qty 100

## 2022-10-10 MED ORDER — MIDAZOLAM HCL 2 MG/2ML IJ SOLN
INTRAMUSCULAR | Status: DC | PRN
  Administered 2022-10-10: 2 mg via INTRAVENOUS

## 2022-10-10 MED ORDER — SUGAMMADEX SODIUM 200 MG/2ML IV SOLN
INTRAVENOUS | Status: DC | PRN
  Administered 2022-10-10: 200 mg via INTRAVENOUS

## 2022-10-10 MED ORDER — FENTANYL CITRATE (PF) 100 MCG/2ML IJ SOLN
INTRAMUSCULAR | Status: DC | PRN
  Administered 2022-10-10 (×2): 50 ug via INTRAVENOUS

## 2022-10-10 MED ORDER — FAMOTIDINE 20 MG PO TABS
ORAL_TABLET | ORAL | Status: AC
  Filled 2022-10-10: qty 1

## 2022-10-10 MED ORDER — DEXAMETHASONE SODIUM PHOSPHATE 10 MG/ML IJ SOLN
INTRAMUSCULAR | Status: DC | PRN
  Administered 2022-10-10: 10 mg via INTRAVENOUS

## 2022-10-10 MED ORDER — ONDANSETRON HCL 4 MG/2ML IJ SOLN
INTRAMUSCULAR | Status: DC | PRN
  Administered 2022-10-10: 4 mg via INTRAVENOUS

## 2022-10-10 NOTE — Op Note (Signed)
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  Now with left upper lobe lung nodules and mediastinal adenopathy, concern for renal cell carcinoma recurrence.  Preoperative Diagnosis: Lung nodules, mediastinal adenopathy, rule out cancer recurrence Post Procedure Diagnosis: Same as above Consent: Verbal/Written: obtained  Benefits, limitations and potential complications of the procedure were discussed with the patient/family.  Complications from bronchoscopy are rare and most often minor, but if they occur they may include breathing difficulty, vocal cord spasm, hoarseness, slight fever, vomiting, dizziness, bronchospasm, infection, low blood oxygen, bleeding from biopsy site, or an allergic reaction to medications.  It is uncommon for patients to experience other more serious complications for example: Collapsed lung requiring chest tube placement, respiratory failure, heart attack and/or cardiac arrhythmia.  Patient understood the potential complications and agreed to proceed.  Surgeon: Gailen Shelter, MD Assistant/Scrub: Leonie Man, RRT Circulator: Everlean Alstrom, RRT Anesthesiologist/CRNA: Lenard Simmer, MD/Sherri Thad Ranger, CRNA Cytotechnology: Available for sample collection only, no ROSE available Fluoroscopy technician: Neysa Hotter, RT Representatives: N/A  Type of Anesthesia: General endotracheal  Procedures Performed:   Robotic bronchoscopy: Procedure consists of robotic navigation comprised of electromagnetics, optical pattern recognition and robotic kinematic data - to triangulate bronchoscope location during the procedure and provide accurate positional data to biopsy a lesion. Cellvizio probe based confocal laser endomicroscopy (pCLE) utilizing  blue laser endomicroscopy. Augmented fluoroscopy with Body Vision.  Description of Procedure:  Robotic bronchoscopy: The patient was brought to Procedure Room 2 (Bronchoscopy Suite) in the OR area where appropriate timeout was taken with the staff.  The patient was inducted under general anesthesia and intubated by the anesthesia team.  Patient was intubated with a 8.5 ET tube without difficulty.  Tube was secured at 4 cm above the carina.  A Portex adapter was placed on the ET tube flange.  Once the patient was under adequate general anesthesia the Olympus therapeutic video bronchoscope was advanced and an anatomic airway tour and surveillance bronchoscopy was performed.The distal trachea appeared unremarkable. The main carina was sharp.  No secretions were seen in either right or left mainstem bronchi. The RUL, RLL, RML appeared to be free of endobronchial masses, lesions, or purulent secretions. Likewise, the LLLand lingula appeared to be free of endobronchial masses, lesions, or purulent secretions.  There were thick white/yellow appearing secretions on the LUL with distortion of the apical posterior subsegment.  No true endobronchial lesion.  The area of airway distortion was very friable.  Secretions were suctioned until cleared.  Once the survey bronchoscopy was completed, registration for the augmented fluoroscopy (Body Vision) was then performed with the fluoroscopic C arm.  Once this was completed, the robotic bronchoscope ET tube adapter was placed and ETT was cut to proper length and secured on the mid plane.  The [facility] robotic scope was then advanced through the ETT and registration was performed successfully.  There was good correlation between the robotic mapping and bronchoscopic mapping. With the assistance of fused navigation, the bronchoscope was advanced to the LUL nodule/mass. The tip of the working channel sat within 16 mm of the nodule  Positioning was confirmed with augmented  fluoroscopy.  At this point Cellvizio probe based confocal laser endomicroscopy (pCLE) was utilized to confirm target acquisition.  Adequate images with Cellvizio could not be obtained as the probe could not make the curvature to reach target.  Augmented fluoroscopy via Body Vision was utilized to optimize the position most favorable for biopsies, then the robotic bronchoscope was anchored to maintain position.  8 transbronchial biopsies were obtained at this point, the lesion could be seen moving during biopsies.  After confirmation of excellent hemostasis, 2 passes with a cytology brush on the RUL nodule/mass were performed, the brushes were cut into CytoLyt preservative. The robotic bronchoscope was then retracted all the way out after confirmation of excellent hemostasis.  At this point the robotic procedure was terminated and the bronchoscope was switched to an Olympus endobronchial ultrasound scope.  The mediastinum was examined and there was extensive precarinal lymphadenopathy corresponding to 4L, other stations were not amenable to sampling by EBUS.  The nodal mass at 4L appeared to "wrap around" the precarinal area.  Utilizing a Cook 25-gauge EBUS needle and a Cook 22-gauge EBUS needle a total of 6 passes were obtained for analysis.  The specimen was placed in CytoLyt preservative.  Once this was completed the endobronchial ultrasound scope was removed and the Olympus video bronchoscope was then advanced and the airway was inspected.  Hemostasis was excellent. The patient then received bronchial lavage with 9 mL of 1% lidocaine. The patient tolerated the procedure well. No significant bleeding was observed at the conclusion of the procedure.  At this point, the patient was allowed to emerge from general anesthesia, and was extubated in the procedure room without incident.  The patient  was taken to the PACU in satisfactory condition.  Auscultation of the lungs showed no change from pre bronchoscopy  examination.  Patient tolerated the procedure well with no untoward effects of anesthesia noted.   Specimens Obtained:  Transbronchial Forceps Biopsy: LUL, x 8  Transbronchial Brush: LUL, x 2  EBUS TBNA: Station 4L X 6  Fluoroscopy: Augmented fluoroscopy (Body Vision) was utilized during the course of this procedure to assure that biopsies were taken in a safe manner under fluoroscopic guidance with spot films required.  Total fluoroscopy time: 3 minutes 21 seconds, total dose: 21.61 mGy.D  Intraoperative images:  Mucosal changes noted on left upper lobe orifice:   Robotic bronchoscope in position   Station 4L lymph node:   Needle sampling station 4L lymph node:   Additional view of nodal tissue contiguous with 4L:   Complications: None, no pneumothorax on post film:   Estimated Blood Loss: Nil   Assessment and Plan/Additional Comments: Lung nodules left upper lobe, rule out metastatic carcinoma Mediastinal adenopathy rule out metastatic carcinoma History of renal cell carcinoma, left History of ANCA positive vasculitis Await pathology reports Patient has appropriate pulmonary/oncology follow-ups.   Gailen Shelter, MD Advanced Bronchoscopy PCCM [facility]    *This note was dictated using voice recognition software/Dragon.  Despite best efforts to proofread, errors can occur which can change the meaning.  Any change was purely unintentional.

## 2022-10-10 NOTE — Discharge Instructions (Addendum)
It is normal to have a little bit of blood when you cough after the procedure.  Do not be alarmed by this.  If it gets to be larger than quarter size and frequent then you should seek evaluation in the emergency room but for the most part this is not an issue.  Do not take your Plavix (clopidogrel) and aspirin until Sunday morning (9/29).  We should have your pathology reports back by end of the week next week.  We will let you know as soon as we have them.  Es normal tener un poco de sangre al toser despus del procedimiento.  No os alarmis por esto.  Si llega a ser ms grande que el tamao de [place] de veinticinco centavos,y  frecuente, entonces debe buscar una evaluacin en la sala de [facility], pero en su mayor parte esto no es un problema.  No tome Plavix (clopidogrel) y aspirina hasta el domingo por la maana (29/9).  Deberamos tener sus informes de patologa a finales de la prxima semana.  Te avisaremos tan pronto como los tengamos.      CIRUGIA AMBULATORIA       Instruccionnes de alta       1.  Las drogas que se Dispensing optician en su cuerpo [employer], asi            que por las proximas 24 horas usted no debe:   Conducir Field seismologist) un automovil   Hacer ninguna decision legal   Tomar ninguna bebida alcoholica  2.  A) [place] puede comenzar una dieta regular.  Es mejor que hoy empiece con           liquidos y gradualmente anada [address]       B) Puede comer cualquier comida que desee pero es mejor empezar con liquidos,                      luego sopitas con galletas saladas y gradualmente llegar a las comidas solidas.  3.  Por favor avise a su medico inmediatamente si usted tiene algun sangrado anormal,       tiene dificultad con la respiracion, enrojecimiento y Engineer, mining en el sitio de la cirugia, [place],       fiebro o dolor que se alivia con [place].          B)  Por favor llame para hacer la cita posoperatoria.  5.  Istrucciones  especificas :  AMBULATORY SURGERY  DISCHARGE INSTRUCTIONS   The drugs that you were given will stay in your system until tomorrow so for the next 24 hours you should not:  Drive an automobile Make any legal decisions Drink any alcoholic beverage   You may resume regular meals tomorrow.  Today it is better to start with liquids and gradually work up to solid foods.  You may eat anything you prefer, but it is better to start with liquids, then soup and crackers, and gradually work up to solid foods.   Please notify your doctor immediately if you have any unusual bleeding, trouble breathing, redness and pain at the surgery site, drainage, fever, or pain not relieved by medication.    Additional Instructions:        Please contact your physician with any problems or Same Day Surgery at [phone], Monday through Friday 6 am to 4 pm, or [place] at [facility] number at [phone].

## 2022-10-10 NOTE — Interval H&P Note (Signed)
Mark Donaldson has presented today for surgery, with the diagnosis of LEFT UPPER LOBE NODULES AND MEDIASTINAL ADENOPATHY.  The various methods of treatment have been discussed with the patient and family. After consideration of risks, benefits and other options for treatment, the patient has consented to  Procedure(s): ROBOTIC ASSISTED NAVIGATIONAL BRONCHOSCOPY AND ENDOBRONCHIAL ULTRASOUND WITH BIOPSIES-LEFT/Medial as a surgical intervention.  The patient's history has been reviewed, patient examined, no change in status, stable for surgery.  I have reviewed the patient's chart and labs.  Questions were answered to the patient's satisfaction.  Benefits, limitations and potential complications of the procedure were discussed with the patient/family.  Complications from bronchoscopy are rare and most often minor, but if they occur they may include breathing difficulty, vocal cord spasm, hoarseness, slight fever, vomiting, dizziness, bronchospasm, infection, low blood oxygen, bleeding from biopsy site, or an allergic reaction to medications.  It is uncommon for patients to experience other more serious complications for example: Collapsed lung requiring chest tube placement, respiratory failure, heart attack and/or cardiac arrhythmia.  Patient agrees to proceed.  Gailen Shelter, MD Advanced Bronchoscopy PCCM [facility]    *This note was dictated using voice recognition software/Dragon.  Despite best efforts to proofread, errors can occur which can change the meaning. Any transcriptional errors that result from this process are unintentional and may not be fully corrected at the time of dictation.

## 2022-10-10 NOTE — Transfer of Care (Signed)
Immediate Anesthesia Transfer of Care Note  Patient: Mark Donaldson  Procedure(s) Performed: ROBOTIC ASSISTED NAVIGATIONAL BRONCHOSCOPY (Left) ENDOBRONCHIAL ULTRASOUND (Left)  Patient Location: PACU  Anesthesia Type:General  Level of Consciousness: awake  Airway & Oxygen Therapy: Patient Spontanous Breathing  Post-op Assessment: Report given to RN and Post -op Vital signs reviewed and stable  Post vital signs: Reviewed and stable  Last Vitals:  Vitals Value Taken Time  BP 120/75 10/10/22 1417  Temp    Pulse 67 10/10/22 1424  Resp 19 10/10/22 1424  SpO2 99 % 10/10/22 1424  Vitals shown include unfiled device data.  Last Pain:  Vitals:   10/10/22 1417  TempSrc:   PainSc: 0-No pain         Complications: No notable events documented.

## 2022-10-10 NOTE — Anesthesia Procedure Notes (Signed)
Procedure Name: Intubation Date/Time: 10/10/2022 12:49 PM  Performed by: Cheral Bay, CRNAPre-anesthesia Checklist: Patient identified, Emergency Drugs available, Suction available and Patient being monitored Patient Re-evaluated:Patient Re-evaluated prior to induction Oxygen Delivery Method: Circle system utilized Preoxygenation: Pre-oxygenation with 100% oxygen Induction Type: IV induction Ventilation: Mask ventilation without difficulty Laryngoscope Size: McGraph and 4 Grade View: Grade I Tube type: Oral Number of attempts: 1 Airway Equipment and Method: Stylet Placement Confirmation: ETT inserted through vocal cords under direct vision, positive ETCO2 and breath sounds checked- equal and bilateral Secured at: 20 cm Tube secured with: Tape Dental Injury: Teeth and Oropharynx as per pre-operative assessment

## 2022-10-10 NOTE — Anesthesia Preprocedure Evaluation (Signed)
Anesthesia Evaluation  Patient identified by MRN, date of birth, ID band Patient awake  General Assessment Comment:Never had general anesthesia  Reviewed: Allergy & Precautions, NPO status , Patient's Chart, lab work & pertinent test results  History of Anesthesia Complications Negative for: history of anesthetic complications  Airway Mallampati: III  TM Distance: >3 FB Neck ROM: Full    Dental no notable dental hx. (+) Teeth Intact, Dental Advisory Given   Pulmonary neg pulmonary ROS, neg shortness of breath, neg sleep apnea, neg COPD, neg recent URI, Patient abstained from smoking.Not current smoker   Pulmonary exam normal breath sounds clear to auscultation       Cardiovascular Exercise Tolerance: Good METShypertension, Pt. on medications (-) angina + CAD and +CHF  (-) Past MI (-) dysrhythmias  Rhythm:Regular Rate:Normal - Systolic murmurs  TTE 2021: 1. Left ventricular ejection fraction, by estimation, is 60 to 65%. The  left ventricle has normal function. The left ventricle has no regional  wall motion abnormalities. There is mild left ventricular hypertrophy.  Left ventricular diastolic parameters  are consistent with Grade I diastolic dysfunction (impaired relaxation).  2. Right ventricular systolic function is normal. The right ventricular  size is normal.  3. Left atrial size was mildly dilated.  4. The mitral valve is normal in structure. Trivial mitral valve  regurgitation.  5. The aortic valve is tricuspid. Aortic valve regurgitation is not  visualized. Mild aortic valve sclerosis is present, with no evidence of  aortic valve stenosis.    Neuro/Psych negative neurological ROS  negative psych ROS   GI/Hepatic ,neg GERD  ,,(+)     (-) substance abuse    Endo/Other  neg diabetes    Renal/GU Renal disease  negative genitourinary   Musculoskeletal   Abdominal   Peds negative pediatric ROS (+)   Hematology   Anesthesia Other Findings Past Medical History: No date: ANCA-positive vasculitis (HCC) No date: CAD (coronary artery disease) No date: Essential hypertension No date: HFrEF (heart failure with reduced ejection fraction) (HCC) No date: Hyperlipidemia LDL goal <70 No date: Ischemic cardiomyopathy No date: Renal disorder    Reproductive/Obstetrics                             Anesthesia Physical Anesthesia Plan  ASA: 3  Anesthesia Plan: General   Post-op Pain Management: Minimal or no pain anticipated   Induction: Intravenous  PONV Risk Score and Plan: 3 and Ondansetron, Dexamethasone, Treatment may vary due to age or medical condition and Midazolam  Airway Management Planned: Oral ETT  Additional Equipment: None  Intra-op Plan:   Post-operative Plan: Extubation in OR  Informed Consent: I have reviewed the patients History and Physical, chart, labs and discussed the procedure including the risks, benefits and alternatives for the proposed anesthesia with the patient or authorized representative who has indicated his/her understanding and acceptance.     Dental advisory given and Interpreter used for interveiw (spanish interpreter at bedside)  Plan Discussed with: CRNA and Surgeon  Anesthesia Plan Comments:         Anesthesia Quick Evaluation

## 2022-10-12 ENCOUNTER — Encounter: Payer: Self-pay | Admitting: Pulmonary Disease

## 2022-10-13 ENCOUNTER — Encounter: Payer: Self-pay | Admitting: Pulmonary Disease

## 2022-10-13 ENCOUNTER — Other Ambulatory Visit: Payer: Self-pay

## 2022-10-13 ENCOUNTER — Encounter: Payer: Self-pay | Admitting: Oncology

## 2022-10-13 LAB — CYTOLOGY - NON PAP

## 2022-10-13 LAB — SURGICAL PATHOLOGY

## 2022-10-14 ENCOUNTER — Other Ambulatory Visit: Payer: Self-pay

## 2022-10-17 ENCOUNTER — Encounter: Payer: Self-pay | Admitting: Oncology

## 2022-10-17 ENCOUNTER — Inpatient Hospital Stay: Payer: Self-pay | Attending: Oncology | Admitting: Oncology

## 2022-10-17 VITALS — BP 121/90 | HR 72 | Temp 97.4°F | Resp 18 | Wt 152.4 lb

## 2022-10-17 DIAGNOSIS — D631 Anemia in chronic kidney disease: Secondary | ICD-10-CM | POA: Insufficient documentation

## 2022-10-17 DIAGNOSIS — C642 Malignant neoplasm of left kidney, except renal pelvis: Secondary | ICD-10-CM | POA: Insufficient documentation

## 2022-10-17 DIAGNOSIS — Z905 Acquired absence of kidney: Secondary | ICD-10-CM | POA: Insufficient documentation

## 2022-10-17 DIAGNOSIS — Z7982 Long term (current) use of aspirin: Secondary | ICD-10-CM | POA: Insufficient documentation

## 2022-10-17 DIAGNOSIS — I130 Hypertensive heart and chronic kidney disease with heart failure and stage 1 through stage 4 chronic kidney disease, or unspecified chronic kidney disease: Secondary | ICD-10-CM | POA: Insufficient documentation

## 2022-10-17 DIAGNOSIS — Z79899 Other long term (current) drug therapy: Secondary | ICD-10-CM | POA: Insufficient documentation

## 2022-10-17 DIAGNOSIS — N1832 Chronic kidney disease, stage 3b: Secondary | ICD-10-CM | POA: Insufficient documentation

## 2022-10-17 DIAGNOSIS — Z23 Encounter for immunization: Secondary | ICD-10-CM | POA: Insufficient documentation

## 2022-10-17 DIAGNOSIS — Z7902 Long term (current) use of antithrombotics/antiplatelets: Secondary | ICD-10-CM | POA: Insufficient documentation

## 2022-10-17 DIAGNOSIS — G969 Disorder of central nervous system, unspecified: Secondary | ICD-10-CM

## 2022-10-17 NOTE — Assessment & Plan Note (Addendum)
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  S/p 34 cycles of Keytruda [ about 2 years's treatment] - stopped in Sept 2023 --> 09/2022 Biopsy via bronchoscopy pathology showed disease recurrence, metastatic clear cell carcinoma Pathology and image results were reviewed with patient.  Recommend resuming systemic treatment.  Recommend cabozantinib 60mg  daily.  Will check coverage.  Obtain MRI brain w wo contrast

## 2022-10-17 NOTE — Assessment & Plan Note (Signed)
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 Repeat MRI brain

## 2022-10-17 NOTE — Assessment & Plan Note (Signed)
avoid nephrotoxins. Encourage oral hydration 

## 2022-10-17 NOTE — Progress Notes (Signed)
Pt here for follow up. No new concerns voiced.   

## 2022-10-17 NOTE — Progress Notes (Signed)
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  S/p 34 cycles of Keytruda [ about 2 years's treatment] - stopped in Sept 2023 --> 09/2022 Biopsy via bronchoscopy pathology showed disease recurrence, metastatic clear cell carcinoma Pathology and image results were reviewed with patient.  Recommend resuming systemic treatment.  Recommend cabozantinib 60mg  daily.  Will check coverage.  Obtain MRI brain w wo contrast   Stage 3b chronic kidney disease (HCC) avoid nephrotoxins. Encourage oral hydration  CNS lesion 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 Repeat MRI brain  Orders Placed This Encounter  Procedures   MR Brain W Wo Contrast    Standing Status:   Future    Standing Expiration Date:   10/17/2023    Order Specific Question:   If indicated for the ordered procedure, I authorize the administration of contrast media per Radiology protocol    Answer:   Yes    Order Specific Question:   What is the patient's sedation requirement?    Answer:   No Sedation    Order Specific Question:   Does the patient have a pacemaker or implanted devices?    Answer:   No    Order Specific Question:   Use SRS Protocol?    Answer:   No    Order Specific Question:   Preferred imaging location?    Answer:   [name] (table limit - 550lbs)      Patient is Spanish-speaking.   Spanish interpreter service utilized for entire encounter   Return visit  2 weeks after starting treatment.  All questions were answered. The  patient knows to call the clinic with any problems, questions or concerns.  Mark Patience, MD, PhD [facility] Hematology Oncology 10/17/2022   PERTINENT HISTORY-  62 y.o. male presents for follow-up of renal cell carcinoma  Oncology History  Renal cell carcinoma of left kidney (HCC)  05/23/2019 Initial Diagnosis   Renal cell carcinoma of left kidney   -05/02/2019-05/07/2019 due to gross hematuria. CT showed a 7.4 cm left lower pole renal mass concerning for RCC.  Patient also has necrotic lymphadenopathy in the mediastinum and the right supra hilar region which were highly suspicious for metastatic disease.  Bilateral lung nodules, also concerning for metastatic lung disease.  Right retrocrural lymphadenopathy with upper normal left periaortic lymph node. Patient was recommended for cytoreductive radical nephrectomy.  With history of ANCA positive vasculitis, also recommend thoracic lymphadenopathy biopsy to confirm distal metastasis.  Patient agreed to radical nephrectomy and declined bronchoscopy biopsy. He underwent left radical nephrectomy on 05/05/2019. Pathology showed pT3a pNx, RCC, conventional clear cell type, grade 3    05/23/2019 Cancer Staging   Staging form: Kidney, AJCC 8th Edition - Pathologic: Stage IV (pT3a, pNX, cM1) - Signed by Mark Patience, MD on 05/23/2019   06/07/2019 - 09/25/2021 Chemotherapy   Keytruda + Axitinib.  06/07/19 He started Axitinib 5mg  BID, stopped on 07/25/2020 due to AKI, resumed on 10/04/2019, stopped again on 05/01/2020 due to diarrhea. 09/11/2020 resumed axitinib 5 mg twice daily, stopped on 10/23/2020  due to diarrhea.  01/17/2021 Keytruda was held due to transaminitis, immunotherapy induced liver toxicities. 02/20/2021, resumed Keytruda.  09/25/2021 last dose of Keytruda    01/12/2020 Imaging   CT showed overall marked improvement.  Comparing to CT scan in August 2021, continues to have treatment response.Mediastinal soft tissue nodule nearly completely resolved,  right minor fissure nodule 8 mm, decreased in size.  Resolution of pleural fluid and thickening of the right chest.  Small pleural effusion in the left chest.  No new lung nodules.-Subtle variation of the muscle architecture of the left psoas muscle of unknown significance   04/30/2020 Imaging   CT chest abdomen pelvis without contrast showed stable disease   11/28/2020 Imaging   CT abdomen pelvis without contrast showed stable examination.  No evidence of new or progressive disease in the chest abdomen pelvis.  Stable to minimally decreased size of 5 mm pulmonary nodule in the minor fissure in the right chest.  Sigmoid colon diverticulosis without findings of acute diverticulitis.   03/08/2021 Imaging   CT scan showed NED   07/31/2021 Imaging   CT chest abdomen pelvis showed stable disease   08/27/2021 Imaging   MRI brain with and without contrast Decrease in size of lesions presumably reflecting neurocysticercosis. No evidence new metastatic disease   08/29/2021 Imaging   CT Chest abdomen pelvis w contrast 1. No findings of active malignancy. 6 by 4 mm right upper lobe nodule along the major fissure is stable and likely represents a previously treated metastatic lesion. 2. Coronary artery atherosclerosis disproportionate to atherosclerosis elsewhere in the chest, abdomen, and pelvis. 3. Mild surgical bronchiectasis in both lower lobes. 4. Mild prostatomegaly. 5. 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 Status post left nephrectomy, without recurrent or metastatic disease. 2. Similar small right-sided pulmonary nodules. 3. New or progressive posterior left upper lobe clustered nodularity and ground-glass, suspicious for minimal infection. 4.  Coronary artery atherosclerosi   09/25/2022 Imaging   CT chest abdomen pelvis w contrast showed CHEST:   1. New nodularity in the LEFT upper lobe with new  mediastinal lymphadenopathy. 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 2. No skeletal metastasis.   PELVIS:   1. No evidence of local recurrence in the LEFT nephrectomy bed. 2. No evidence of metastatic disease in the abdomen pelvis.    10/08/2022 Imaging   PET scan showed  1. Hypermetabolic mediastinal and left hilar lymphadenopathy with hypermetabolic nodularity in the posterior left upper lobe. Imaging features are compatible with metastatic disease. 2. No evidence for metastatic disease in the abdomen or pelvis. 3. Changes of avascular necrosis in the femoral heads bilaterally.    10/10/2022 Relapse/Recurrence   S/p biopsy via bronchoscopy  1. Lung, biopsy, Left upper lobe :       - METASTATIC CLEAR CELL RENAL CELL CARCINOMA   Bronchial Brushing Lung, upper lobe  Fine Needle Aspiration  - POSITIVE FOR MALIGNANCY.       - CONSISTENT WITH METASTATIC CLEAR CELL RENAL CELL CARCINOMA.     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  Patient has been referred to orthopedic surgeon in [place] which he is not able to go due to transportation.  He does not have insurance as well.  He is currently asymptomatic.  Monitor.  Patient has past medical history CAD status post PCI to LAD in 2018, on aspirin and  Plavix, CHF, history of focal sclerosing/crescentic GN-ANCA positive and CKD stage III. 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 He finished 14 days of albendazole plus praziquantel .   He has had few side effects including poor appetite, hair loss, weight loss, bloody stool, abdominal cramping or diarrhea. All symptoms have improved since he completed the treatment. 03/16/2020 he finished tapering course of steroids  #Intermittent orthopnea,Pre-existing diastolic CHF, 05/04/2019 LVEF 60 to 65%.   Recommend him to follow-up with cardiology-he is has not able to follow-up with cardiology due to lack of  insurance coverage.  # He has frequent urination at night.  Previously tried Cardura for BPH and had to stop due hypotension. 07/2022 S/p EGD and colonoscopy - diverticulosis, non bleeding internal hemorrhoids.  Gastritis.  INTERVAL HISTORY Mark Donaldson is a 62 y.o. male who has above history reviewed by me today presents for follow up visit for metastatic RCC Patient reports occasional cough, no shortness of breath.     Review of systems- Review of Systems  Constitutional:  Negative for appetite change, chills, fatigue, fever and unexpected weight change.  HENT:   Negative for voice change.   Eyes:  Negative for eye problems and icterus.  Respiratory:  Positive for cough. Negative for chest tightness.   Cardiovascular:  Negative for chest pain and leg swelling.  Gastrointestinal:  Negative for abdominal distention, abdominal pain and diarrhea.  Endocrine: Negative for hot flashes.  Genitourinary:  Negative for difficulty urinating, dysuria, frequency and nocturia.   Musculoskeletal:  Positive for arthralgias.  Skin:  Negative for itching and rash.  Neurological:  Negative for light-headedness and numbness.  Hematological:  Negative for adenopathy. Does not bruise/bleed easily.  Psychiatric/Behavioral:  Negative for confusion.     No Known Allergies  Patient Active Problem List   Diagnosis Date Noted   Renal cell carcinoma of left kidney (HCC) 05/23/2019    Priority: High   Stage 3b chronic kidney disease (HCC) 10/04/2019    Priority: Medium    CNS lesion 07/07/2019    Priority: Medium    IDA (iron deficiency anemia) 11/21/2021    Priority: Low   Encounter for antineoplastic immunotherapy 07/19/2019    Priority: Low   Encounter for antineoplastic chemotherapy 07/19/2019    Priority: Low   Goals of care, counseling/discussion 05/23/2019    Priority: Low   Gastritis without bleeding 07/30/2022   Cough 06/03/2022   Avascular necrosis of bones of both hips (HCC)  06/20/2021   Transaminitis 01/17/2021   History of CHF (congestive heart failure) 05/01/2020   Hyperglycemia 02/28/2020   Brain lesion 10/04/2019   Anemia in stage 3b chronic kidney disease (HCC) 06/07/2019   Renal cell carcinoma (HCC) 05/23/2019   Palliative care encounter    History of vasculitis    Thoracic lymphadenopathy    Lung nodules    CAD S/P percutaneous coronary angioplasty 05/02/2019   Kidney mass 05/02/2019   Hydronephrosis of left kidney 05/02/2019   UTI (urinary tract infection) 05/02/2019   AKI (acute kidney injury) (HCC) 05/02/2019   Gross hematuria 05/02/2019   Chronic diastolic CHF (congestive heart failure) (HCC) 05/02/2019   Sepsis (HCC) 05/02/2019   Severe sepsis (HCC) 05/02/2019   Preop cardiovascular exam    Hyperlipidemia 05/21/2016   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) 05/21/2016   Prediabetes 05/21/2016   Alcohol abuse 05/18/2016   Essential hypertension 05/18/2016   Vasculitis, 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HCC) 05/18/2016   Shortness of breath 05/17/2016  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HCC)    Avascular necrosis of bones of both hips (HCC)    CAD (coronary artery disease)    Essential hypertension    Gastritis without bleeding    HFrEF (heart failure with reduced ejection fraction) (HCC)    Hyperlipidemia LDL goal <70    Iron deficiency anemia    Ischemic cardiomyopathy    Lung nodules 09/2022   left upper lobe   Mediastinal adenopathy 09/2022   Renal cell carcinoma (HCC) 05/23/2019   Renal disorder    Severe sepsis (HCC)    Stage 3b chronic kidney disease (CKD) (HCC)      Past Surgical History:  Procedure Laterality Date   BIOPSY  07/30/2022   Procedure: BIOPSY;  Surgeon: Midge Minium, MD;  Location: [facility];  Service: Endoscopy;;   COLONOSCOPY WITH PROPOFOL N/A 07/30/2022   Procedure: COLONOSCOPY WITH PROPOFOL;  Surgeon: Midge Minium, MD;  Location: [facility] END[facility];  Service:  Endoscopy;  Laterality: N/A;  SPANISH INTERPRETER   ENDOBRONCHIAL ULTRASOUND Left 10/10/2022   Procedure: ENDOBRONCHIAL ULTRASOUND;  Surgeon: Salena Saner, MD;  Location: [facility] ORS;  Service: Pulmonary;  Laterality: Left;   ESOPHAGOGASTRODUODENOSCOPY (EGD) WITH PROPOFOL N/A 07/30/2022   Procedure: ESOPHAGOGASTRODUODENOSCOPY (EGD) WITH PROPOFOL;  Surgeon: Midge Minium, MD;  Location: [facility];  Service: Endoscopy;  Laterality: N/A;   LAPAROSCOPIC NEPHRECTOMY, HAND ASSISTED Left 05/05/2019   Procedure: HAND ASSISTED LAPAROSCOPIC NEPHRECTOMY;  Surgeon: Vanna Scotland, MD;  Location: [facility] ORS;  Service: Urology;  Laterality: Left;   LEFT HEART CATH AND CORONARY ANGIOGRAPHY  05/20/2016   LAD lesion s/p PCI with Xience    Social History   Socioeconomic History   Marital status: Married    Spouse name: Rhydian   Number of children: 4   Years of education: Not on file   Highest education level: Not on file  Occupational History   Not on file  Tobacco Use   Smoking status: Never   Smokeless tobacco: Never  Vaping Use   Vaping status: Never Used  Substance and Sexual Activity   Alcohol use: Not Currently   Drug use: Never   Sexual activity: Not on file  Other Topics Concern   Not on file  Social History Narrative   Not on file   Social Determinants of Health   Financial Resource Strain: Not on file  Food Insecurity: No Food Insecurity (07/14/2022)   [facility]   Hunger Vital Sign    Worried About Running Out of Food in the Last Year: Never true    Ran Out of Food in the Last Year: Never true  Transportation Needs: No Transportation Needs (07/14/2022)   [facility]   [facility] - [facility]    Lack of Transportation (Medical): No    Lack of Transportation (Non-Medical): No  Physical Activity: Not on file  Stress: Not on file  Social Connections: Not on file  Intimate Partner Violence: Not  on file     Family History  Problem Relation Age of Onset   Cancer Mother    Blindness Brother      Current Outpatient Medications:    albuterol (VENTOLIN HFA) 108 (90 Base) MCG/ACT inhaler, Inhale 2 puffs into the lungs every 6 (six) hours as needed for wheezing or shortness of breath., Disp: 6.7 g, Rfl: 2   aspirin 81 MG EC tablet, Take 81 mg by mouth daily. Swallow whole., Disp: , Rfl:  atorvastatin (LIPITOR) 80 MG tablet, Take 80 mg by mouth at bedtime., Disp: , Rfl:    clopidogrel (PLAVIX) 75 MG tablet, Take 75 mg by mouth daily., Disp: , Rfl:    ferrous sulfate 325 (65 FE) MG tablet, Take 1 tablet (325 mg total) by mouth 2 (two) times daily with a meal., Disp: 180 tablet, Rfl: 2   Fluticasone-Umeclidin-Vilant (TRELEGY ELLIPTA) 100-62.5-25 MCG/ACT AEPB, Inhale 1 puff into the lungs daily., Disp: 60 each, Rfl: 11   furosemide (LASIX) 20 MG tablet, Take 1 tablet (20 mg total) by mouth daily., Disp: 30 tablet, Rfl: 3   losartan (COZAAR) 50 MG tablet, Take 50 mg by mouth daily., Disp: , Rfl:    metoprolol tartrate (LOPRESSOR) 100 MG tablet, Take 100 mg by mouth daily., Disp: , Rfl:    Physical exam:  Vitals:   10/17/22 1140  BP: (!) 121/90  Pulse: 72  Resp: 18  Temp: (!) 97.4 F (36.3 C)  Weight: 152 lb 6.4 oz (69.1 kg)    Physical Exam Constitutional:      General: He is not in acute distress.    Appearance: He is not diaphoretic.  HENT:     Head: Normocephalic and atraumatic.  Eyes:     General: No scleral icterus.    Pupils: Pupils are equal, round, and reactive to light.  Cardiovascular:     Rate and Rhythm: Normal rate and regular rhythm.     Heart sounds: No murmur heard. Pulmonary:     Effort: Pulmonary effort is normal. No respiratory distress.  Chest:     Chest wall: No tenderness.  Abdominal:     General: There is no distension.     Palpations: Abdomen is soft.     Tenderness: There is no abdominal tenderness.  Musculoskeletal:        General: Normal  range of motion.     Cervical back: Normal range of motion and neck supple.  Skin:    General: Skin is warm and dry.     Findings: No erythema.  Neurological:     Mental Status: He is alert and oriented to person, place, and time. Mental status is at baseline.     Cranial Nerves: No cranial nerve deficit.     Motor: No abnormal muscle tone.  Psychiatric:        Mood and Affect: Mood and affect normal.       Labs    Latest Ref Rng & Units 10/02/2022    1:20 PM 03/25/2022    1:18 PM 03/10/2022    1:20 PM  CBC  WBC 4.0 - 10.5 K/uL 8.9  10.4  10.5   Hemoglobin 13.0 - 17.0 g/dL 40.9  81.1  91.4   Hematocrit 39.0 - 52.0 % 47.0  41.5  41.8   Platelets 150 - 400 K/uL 162  172  176       Latest Ref Rng & Units 10/02/2022    1:20 PM 03/25/2022    1:18 PM 03/10/2022    1:23 PM  CMP  Glucose 70 - 99 mg/dL 95  782  99   BUN 6 - 20 mg/dL 36  40  26   Creatinine 0.61 - 1.24 mg/dL 9.56  2.13  0.86   Sodium 135 - 145 mmol/L 138  139  136   Potassium 3.5 - 5.1 mmol/L 4.4  4.4  4.2   Chloride 98 - 111 mmol/L 105  107  104   CO2 22 - 32  mmol/L 23  24  24    Calcium 8.9 - 10.3 mg/dL 9.4  9.2  8.8   Total Protein 6.5 - 8.1 g/dL 6.9  7.1    Total Bilirubin 0.3 - 1.2 mg/dL 0.9  0.7    Alkaline Phos 38 - 126 U/L 87  97    AST 15 - 41 U/L 29  30    ALT 0 - 44 U/L 27  21        RADIOGRAPHIC STUDIES: I have personally reviewed the radiological images as listed and agreed with the findings in the report. DG Chest Port 1 View  Result Date: 10/10/2022 CLINICAL DATA:  Status post bronchoscopy and biopsy. Concern for pneumothorax. Renal cell carcinoma. EXAM: PORTABLE CHEST 1 VIEW COMPARISON:  Chest radiograph dated 08/04/2019 and CT dated 10/08/2022. FINDINGS: Left suprahilar density corresponding to the nodularity seen on the prior CT and PET. No new consolidation. There is no pleural effusion or pneumothorax. The cardiac silhouette is within normal limits. No acute osseous pathology. IMPRESSION: 1.  No pneumothorax. 2. Left suprahilar nodularity. Electronically Signed   By: Elgie Collard M.D.   On: 10/10/2022 16:20   DG C-Arm 1-60 Min-No Report  Result Date: 10/10/2022 Fluoroscopy was utilized by the requesting physician.  No radiographic interpretation.   CT SUPER D CHEST WO MONARCH PILOT  Result Date: 10/08/2022 CLINICAL DATA:  Lung nodules. 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 EXAM: CT CHEST WITHOUT CONTRAST TECHNIQUE: Multidetector CT imaging of the chest was performed using thin slice collimation for electromagnetic bronchoscopy planning purposes, without intravenous contrast. RADIATION DOSE REDUCTION: This exam was performed according to the departmental dose-optimization program which includes automated exposure control, adjustment of the mA and/or kV according to patient size and/or use of iterative reconstruction technique. COMPARISON:  09/25/2022, 03/19/2022, 11/18/2021, 08/28/2021 and 11/28/2020. FINDINGS: Cardiovascular: Atherosclerotic calcification of the aortic valve with age advanced involvement of the coronary arteries. Heart size normal. No pericardial effusion. Mediastinum/Nodes: Mediastinal lymph nodes measure up to 2.7 cm in the AP window, as on 09/25/2022 but new from 03/19/2022. Hilar regions are difficult to evaluate without IV contrast. No axillary adenopathy. Esophagus is grossly unremarkable. Lungs/Pleura: Nodular consolidation and ground-glass in the posterior left upper lobe, unchanged from 09/25/2022 but progressive from 03/19/2022. No pleural fluid. There may be slight narrowing of left upper lobe bronchi. Airway is otherwise unremarkable. Upper Abdomen: Visualized portions of the liver, gallbladder, adrenal glands, right kidney, spleen, pancreas, stomach and bowel are grossly unremarkable. Left nephrectomy. No upper abdominal adenopathy. Musculoskeletal: None. IMPRESSION: 1. Mediastinal adenopathy with increasing nodular consolidation  and ground-glass in the posterior left upper lobe, findings worrisome for metastatic disease. 2. Today's PET dictated separately. 3. Age advanced coronary artery calcification. Electronically Signed   By: Leanna Battles M.D.   On: 10/08/2022 14:37   NM PET Image Restag (PS) Skull Base To Thigh  Result Date: 10/08/2022 CLINICAL DATA:  Subsequent treatment strategy for renal cell carcinoma. EXAM: NUCLEAR MEDICINE PET SKULL BASE TO THIGH TECHNIQUE: 8 mCi F-18 FDG was injected intravenously. Full-ring PET imaging was performed from the skull base to thigh after the radiotracer. CT data was obtained and used for attenuation correction and anatomic localization. Fasting blood glucose: 81 mg/dl COMPARISON:  Chest abdomen pelvis CT 09/25/2022.  PET-CT 09/08/2019 FINDINGS: Mediastinal blood pool activity: SUV max 2.3 Liver activity: SUV max NA NECK: No hypermetabolic lymph nodes in the neck. Incidental CT findings: None. CHEST: Mediastinal left hilar lymphadenopathy identified on the recent chest  CT is hypermetabolic. Pre-vascular and AP window lymphadenopathy demonstrates SUV max = 9.5. Left hilar hypermetabolism noted with SUV max = 10.6. The nodularity in the posterior left upper lobe is hypermetabolic with SUV max = 7.3. Incidental CT findings: Coronary artery calcification is evident. Dependent atelectasis noted in the lower lungs bilaterally. ABDOMEN/PELVIS: No abnormal hypermetabolic activity within the liver, pancreas, adrenal glands, or spleen. No hypermetabolic lymph nodes in the abdomen or pelvis. Incidental CT findings: Left nephrectomy. Diverticular disease noted left colon without diverticulitis. SKELETON: No focal hypermetabolic activity to suggest skeletal metastasis. Incidental CT findings: No worrisome lytic or sclerotic osseous abnormality. Changes of avascular necrosis noted in the femoral heads bilaterally. IMPRESSION: 1. Hypermetabolic mediastinal and left hilar lymphadenopathy with hypermetabolic  nodularity in the posterior left upper lobe. Imaging features are compatible with metastatic disease. 2. No evidence for metastatic disease in the abdomen or pelvis. 3. Changes of avascular necrosis in the femoral heads bilaterally. Electronically Signed   By: Kennith Center M.D.   On: 10/08/2022 12:55   CT CHEST ABDOMEN PELVIS WO CONTRAST  Result Date: 10/01/2022 CLINICAL DATA:  Renal cell carcinoma LEFT kidney. Status post 34 cycles immunotherapy. Assess treatment response. * Tracking Code: BO * EXAM: CT CHEST, ABDOMEN AND PELVIS WITHOUT CONTRAST TECHNIQUE: Multidetector CT imaging of the chest, abdomen and pelvis was performed following the standard protocol without IV contrast. RADIATION DOSE REDUCTION: This exam was performed according to the departmental dose-optimization program which includes automated exposure control, adjustment of the mA and/or kV according to patient size and/or use of iterative reconstruction technique. COMPARISON:  05/19/2022 FINDINGS: CT CHEST FINDINGS Cardiovascular: No significant vascular findings. Normal heart size. No pericardial effusion. Mediastinum/Nodes: Multiple newly enlarged mediastinal lymph nodes. For example prevascular lymph node measuring 20 mm (image 26/2) centrally new from prior. RIGHT lower paratracheal node measuring 20 mm increased from 5 mm. Lungs/Pleura: New nodularity in the LEFT upper lobe at site mild interstitial thickening. Nodule along the oblique fissure in the LEFT upper lobe measures 11 mm (image 52/4 nodule in the LEFT suprahilar lung adjacent to the pulmonary vasculature measures 10 mm (image 59/4. No RIGHT lung nodules. Musculoskeletal: No aggressive osseous lesion. CT ABDOMEN AND PELVIS FINDINGS Hepatobiliary: No focal hepatic lesion. No biliary ductal dilatation. Gallbladder is normal. Common bile duct is normal. Pancreas: Pancreas is normal. No ductal dilatation. No pancreatic inflammation. Spleen: Normal spleen Adrenals/urinary tract:  Adrenal glands normal. Post LEFT nephrectomy. No nodularity in the nephrectomy bed. RIGHT kidney normal.  RIGHT ureter and bladder normal. Stomach/Bowel: Stomach, small bowel, appendix, and cecum are normal. The colon and rectosigmoid colon are normal. Vascular/Lymphatic: Abdominal aorta is normal caliber. There is no retroperitoneal or periportal lymphadenopathy. No pelvic lymphadenopathy. Reproductive: Prostate normal Other: No free fluid. Musculoskeletal: No aggressive osseous lesion. IMPRESSION: CHEST: 1. New nodularity in the LEFT upper lobe with new mediastinal lymphadenopathy. 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 2. No skeletal metastasis. PELVIS: 1. No evidence of local recurrence in the LEFT nephrectomy bed. 2. No evidence of metastatic disease in the abdomen pelvis. Electronically Signed   By: Genevive Bi M.D.   On: 10/01/2022 11:30

## 2022-10-20 ENCOUNTER — Other Ambulatory Visit (HOSPITAL_COMMUNITY): Payer: Self-pay

## 2022-10-20 ENCOUNTER — Telehealth: Payer: Self-pay | Admitting: Pharmacist

## 2022-10-20 ENCOUNTER — Telehealth: Payer: Self-pay

## 2022-10-20 DIAGNOSIS — C642 Malignant neoplasm of left kidney, except renal pelvis: Secondary | ICD-10-CM

## 2022-10-20 NOTE — Telephone Encounter (Signed)
Clinical Pharmacist Practitioner Encounter   Received new prescription for Cabometyx (cabozantinib) for the treatment of metastatic clear cell RCC, planned duration until disease progression or unacceptable drug toxicity.  CMP from 10/02/22 assessed, no relevant lab abnormalities. Prescription dose and frequency assessed.   Current medication list in Epic reviewed, one DDIs with cabozantinib identified: Furosemide: furosemide may increase the serum concentration of Cabozantinib. Monitor closely for evidence of increased cabozantinib adverse effects or toxicity with use of this combination. No recommendation for baseline dose adjustment  Evaluated chart and no patient barriers to medication adherence identified.   Prescription has been e-scribed to the [facility] for benefits analysis and approval.  Oral Oncology Clinic will continue to follow for insurance authorization, copayment issues, initial counseling and start date.   Remi Haggard, PharmD, BCPS, BCOP, CPP Hematology/Oncology Clinical Pharmacist Practitioner [facility] [phone]  10/20/2022 9:18 AM

## 2022-10-20 NOTE — Progress Notes (Signed)
This is a finding on history that did not impact the admission in question.  Cannot clarify any further.  The patient was not having any acute decompensation.  Gailen Shelter, MD Advanced Bronchoscopy PCCM [facility]    *This note was dictated using voice recognition software/Dragon.  Despite best efforts to proofread, errors can occur which can change the meaning. Any transcriptional errors that result from this process are unintentional and may not be fully corrected at the time of dictation.

## 2022-10-20 NOTE — Telephone Encounter (Signed)
Oral Oncology Patient Advocate Encounter   **Patient is uninsured**  Began application for assistance for Cabometyx through [employer] Patient Assistance Program.   Application will be submitted upon completion of necessary supporting documentation.   EASE's phone number [phone].   I will continue to check the status until final determination.    Ardeen Fillers, CPhT Oncology Pharmacy Patient Advocate  [facility]  [phone] (phone) [phone] (fax) 10/20/2022 12:15 PM

## 2022-10-21 NOTE — Telephone Encounter (Signed)
Called and spoke to patient via [employer]. Patient would like to meet me in office tomorrow, Wednesday 10/22/22, to sign application. Patient will be in between 10:30am and 11am. I will submit application once signatures are obtained. I will continue to follow and update until final determination.    Ardeen Fillers, CPhT Oncology Pharmacy Patient Advocate  [facility]  [phone] (phone) [phone] (fax) 10/21/2022 8:32 AM

## 2022-10-22 ENCOUNTER — Telehealth: Payer: Self-pay

## 2022-10-22 DIAGNOSIS — C642 Malignant neoplasm of left kidney, except renal pelvis: Secondary | ICD-10-CM

## 2022-10-22 NOTE — Telephone Encounter (Signed)
Per Leeanne Mannan, RPH, pt will have medication by 10/11 or 10/12.   Please schedule lab/MD (cbc, cmp) approx 2 weeks after 10/11. I will inform pt of appt details.

## 2022-10-22 NOTE — Telephone Encounter (Signed)
-----   Message from Remi Haggard sent at 10/20/2022  9:02 AM EDT ----- We will get to work on it!  Nadara Mode ----- Message ----- From: [name], MD Sent: 10/17/2022   9:34 PM EDT To: Coralee Rud, RN; Paulita Fujita, CMA; #  Hi Nadara Mode and [place],  I plan to start him on monotherapy  cabozantinib 60mg  daily Would you please check coverage.  He can start when he gets the supply. Thank you.  Lanora Manis and [place],  I plan to see him 2 weeks after starting. Lab MD cbc cmp thanks.   Janyth Contes

## 2022-10-22 NOTE — Telephone Encounter (Signed)
Pt informed of appt details. Mail reminder will be sent.

## 2022-10-22 NOTE — Telephone Encounter (Signed)
Clinical Pharmacist Practitioner Encounter   Expecting patient to have medication delivered from the assistance program on 10/11 or 10/12. He will get started when he has medication hand.   Patient Education I spoke with patient (with interpretor on line) for overview of new oral chemotherapy medication: Cabometyx (cabozantinib) for the treatment of metastatic clear cell RCC, planned duration until disease progression or unacceptable drug toxicity.  Counseled patient on administration, dosing, side effects, monitoring, drug-food interactions, safe handling, storage, and disposal. Patient will take 60 mg by mouth daily. Take on an empty stomach, 1 hour before or 2 hours after meals.   Side effects include but not limited to: diarrhea, hand-foot syndrome, nausea, fatigue, mouth sores. Diarrhea: patient knows to use loperamide as needed and call the office if he is having 4 or more loose stools per day Hand-foot syndrome: Recommended he keep his hands and feet moisturized Mouth sores: patient know to report this and  Nausea: patient will call if he needs anything for nausea, he did not want anything sent in yet  Reviewed with patient importance of keeping a medication schedule and plan for any missed doses.  After discussion with patient no patient barriers to medication adherence identified.   Mr. Mark Donaldson voiced understanding and appreciation. All questions answered. Medication handout provided.  Provided patient with Oral Chemotherapy Navigation Clinic phone number. Patient knows to call the office with questions or concerns. Oral Chemotherapy Navigation Clinic will continue to follow.  Remi Haggard, PharmD, BCPS, BCOP, CPP Hematology/Oncology Clinical Pharmacist Practitioner [place] [phone]  10/22/2022 1:40 PM

## 2022-10-22 NOTE — Telephone Encounter (Signed)
Received notification via fax that application was received and that processing of application has been initiated. I will continue to follow and update until final determination.    Ardeen Fillers, CPhT Oncology Pharmacy Patient Advocate  [facility]  [phone] (phone) [phone] (fax) 10/22/2022 12:41 PM

## 2022-10-22 NOTE — Telephone Encounter (Signed)
Oral Oncology Patient Advocate Encounter   Submitted application for assistance for Cabometyx to [employer] Patient Assistance Program.   Application submitted via e-fax to [phone]   EASE's phone number [phone].   I will continue to check the status until final determination.    Ardeen Fillers, CPhT Oncology Pharmacy Patient Advocate  [facility]  [phone] (phone) [phone] (fax) 10/22/2022 11:12 AM

## 2022-10-22 NOTE — Telephone Encounter (Addendum)
Received notification via fax that patient was eligible for 30 day free supply while application is processing. E[employer] will be reaching out to patient to schedule delivery of 30 day free trial. I will continue to follow and update until final determination.    Ardeen Fillers, CPhT Oncology Pharmacy Patient Advocate  [facility]  [phone] (phone) [phone] (fax) 10/22/2022 1:01 PM

## 2022-10-23 NOTE — Telephone Encounter (Signed)
Received notification via fax that Proof of Income or Letter of Hardship was required to continue processing application. As patient currently has no source of income, a Letter of Hardship was signed by MD and submitted via e-fax to [employer] Patient Assistance Program for continued processing. I will continue to follow and update until final determination.    Ardeen Fillers, CPhT Oncology Pharmacy Patient Advocate  [facility]  [phone] (phone) [phone] (fax) 10/23/2022 2:46 PM

## 2022-10-24 ENCOUNTER — Ambulatory Visit: Admission: RE | Admit: 2022-10-24 | Payer: Self-pay | Source: Ambulatory Visit

## 2022-10-25 NOTE — Anesthesia Postprocedure Evaluation (Signed)
Anesthesia Post Note  Patient: Mark Donaldson  Procedure(s) Performed: ROBOTIC ASSISTED NAVIGATIONAL BRONCHOSCOPY (Left) ENDOBRONCHIAL ULTRASOUND (Left)  Patient location during evaluation: PACU Anesthesia Type: General Level of consciousness: awake and alert Pain management: pain level controlled Vital Signs Assessment: post-procedure vital signs reviewed and stable Respiratory status: spontaneous breathing, nonlabored ventilation, respiratory function stable and patient connected to nasal cannula oxygen Cardiovascular status: blood pressure returned to baseline and stable Postop Assessment: no apparent nausea or vomiting Anesthetic complications: no   No notable events documented.   Last Vitals:  Vitals:   10/10/22 1450 10/10/22 1509  BP:  124/88  Pulse: (!) 59 (!) 58  Resp: 12 14  Temp: (!) 36.1 C (!) 36.1 C  SpO2: 98% 100%    Last Pain:  Vitals:   10/10/22 1509  TempSrc: Temporal  PainSc: 0-No pain                 Lenard Simmer

## 2022-10-28 NOTE — Telephone Encounter (Signed)
Oral Oncology Patient Advocate Encounter   Received notification that the application for assistance for Cabometyx  through E[employer] Patient Assistance Program has been approved.   EASE's phone number [phone].   Effective dates: 10/27/22 through 10/27/23  Medication will be filled at [facility].  I have spoken to the patient via [employer]. Patient knows to expect a call from [employer] informing them of approval and to set up fill of Cabometyx. Patient also knows to call me at [phone] with any questions or concerns regarding receiving medication from PAP Program.    Ardeen Fillers, CPhT Oncology Pharmacy Patient Advocate  [facility]  [phone] (phone) [phone] (fax) 10/28/2022 8:52 AM

## 2022-10-31 ENCOUNTER — Other Ambulatory Visit: Payer: Self-pay

## 2022-11-13 ENCOUNTER — Inpatient Hospital Stay (HOSPITAL_BASED_OUTPATIENT_CLINIC_OR_DEPARTMENT_OTHER): Payer: Self-pay | Admitting: Oncology

## 2022-11-13 ENCOUNTER — Encounter: Payer: Self-pay | Admitting: Oncology

## 2022-11-13 ENCOUNTER — Inpatient Hospital Stay: Payer: Self-pay

## 2022-11-13 VITALS — BP 150/118 | HR 75 | Temp 97.1°F | Resp 18 | Wt 151.6 lb

## 2022-11-13 DIAGNOSIS — C642 Malignant neoplasm of left kidney, except renal pelvis: Secondary | ICD-10-CM

## 2022-11-13 DIAGNOSIS — N1832 Chronic kidney disease, stage 3b: Secondary | ICD-10-CM

## 2022-11-13 DIAGNOSIS — Z23 Encounter for immunization: Secondary | ICD-10-CM

## 2022-11-13 DIAGNOSIS — I10 Essential (primary) hypertension: Secondary | ICD-10-CM

## 2022-11-13 LAB — CMP (CANCER CENTER ONLY)
ALT: 62 U/L — ABNORMAL HIGH (ref 0–44)
AST: 78 U/L — ABNORMAL HIGH (ref 15–41)
Albumin: 3.7 g/dL (ref 3.5–5.0)
Alkaline Phosphatase: 168 U/L — ABNORMAL HIGH (ref 38–126)
Anion gap: 8 (ref 5–15)
BUN: 26 mg/dL — ABNORMAL HIGH (ref 6–20)
CO2: 24 mmol/L (ref 22–32)
Calcium: 9.2 mg/dL (ref 8.9–10.3)
Chloride: 102 mmol/L (ref 98–111)
Creatinine: 1.65 mg/dL — ABNORMAL HIGH (ref 0.61–1.24)
GFR, Estimated: 47 mL/min — ABNORMAL LOW (ref >60)
Glucose, Bld: 105 mg/dL — ABNORMAL HIGH (ref 70–99)
Potassium: 4.2 mmol/L (ref 3.5–5.1)
Sodium: 134 mmol/L — ABNORMAL LOW (ref 135–145)
Total Bilirubin: 0.9 mg/dL (ref 0.3–1.2)
Total Protein: 6.7 g/dL (ref 6.5–8.1)

## 2022-11-13 LAB — CBC WITH DIFFERENTIAL (CANCER CENTER ONLY)
Abs Immature Granulocytes: 0.02 10*3/uL (ref 0.00–0.07)
Basophils Absolute: 0 10*3/uL (ref 0.0–0.1)
Basophils Relative: 1 %
Eosinophils Absolute: 0.2 10*3/uL (ref 0.0–0.5)
Eosinophils Relative: 3 %
HCT: 51.2 % (ref 39.0–52.0)
Hemoglobin: 17.1 g/dL — ABNORMAL HIGH (ref 13.0–17.0)
Immature Granulocytes: 0 %
Lymphocytes Relative: 27 %
Lymphs Abs: 2.2 10*3/uL (ref 0.7–4.0)
MCH: 29.5 pg (ref 26.0–34.0)
MCHC: 33.4 g/dL (ref 30.0–36.0)
MCV: 88.3 fL (ref 80.0–100.0)
Monocytes Absolute: 0.7 10*3/uL (ref 0.1–1.0)
Monocytes Relative: 8 %
Neutro Abs: 5.1 10*3/uL (ref 1.7–7.7)
Neutrophils Relative %: 61 %
Platelet Count: 115 10*3/uL — ABNORMAL LOW (ref 150–400)
RBC: 5.8 MIL/uL (ref 4.22–5.81)
RDW: 13.6 % (ref 11.5–15.5)
WBC Count: 8.2 10*3/uL (ref 4.0–10.5)
nRBC: 0 % (ref 0.0–0.2)

## 2022-11-13 MED ORDER — INFLUENZA VIRUS VACC SPLIT PF (FLUZONE) 0.5 ML IM SUSY
0.5000 mL | PREFILLED_SYRINGE | Freq: Once | INTRAMUSCULAR | Status: AC
  Administered 2022-11-13: 0.5 mL via INTRAMUSCULAR
  Filled 2022-11-13: qty 0.5

## 2022-11-13 NOTE — Assessment & Plan Note (Signed)
Not well controlled, due to Carbozantinib Recommend patient to increase Losartan to 50mg  BID.

## 2022-11-13 NOTE — Progress Notes (Signed)
Pt here for follow up. Pt reports that ever since he started Cabometyx, he has decreased appetite due to loss of taste buds and has hoarseness. Reports throat/ esophagus. Pt repots pain to back.

## 2022-11-13 NOTE — Assessment & Plan Note (Signed)
avoid nephrotoxins. Encourage oral hydration 

## 2022-11-13 NOTE — Assessment & Plan Note (Addendum)
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  S/p 34 cycles of Keytruda [ about 2 years's treatment] - stopped in Sept 2023 --> 09/2022 Biopsy via bronchoscopy pathology showed disease recurrence, metastatic clear cell carcinoma Labs are reviewed and discussed with patient.  continue cabozantinib 60mg  daily.  Obtain MRI brain w wo contrast

## 2022-11-13 NOTE — Progress Notes (Signed)
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  S/p 34 cycles of Keytruda [ about 2 years's treatment] - stopped in Sept 2023 --> 09/2022 Biopsy via bronchoscopy pathology showed disease recurrence, metastatic clear cell carcinoma Labs are reviewed and discussed with patient.  continue cabozantinib 60mg  daily.  Obtain MRI brain w wo contrast   Stage 3b chronic kidney disease (HCC) avoid nephrotoxins. Encourage oral hydration  Essential hypertension Not well controlled, due to Carbozantinib Recommend patient to increase Losartan to 50mg  BID.    Orders Placed This Encounter  Procedures   CBC with Differential (Cancer Center Only)    Standing Status:   Future    Standing Expiration Date:   11/13/2023   CMP (Cancer Center only)    Standing Status:   Future    Standing Expiration Date:   11/13/2023    Patient is Spanish-speaking.   Spanish interpreter service utilized for entire encounter   Return visit  2 weeks after starting treatment.  All questions were answered. The patient knows to call the clinic with any problems, questions or concerns.  [name], MD, PhD [facility] Hematology Oncology 11/13/2022   PERTINENT HISTORY-  62 y.o. male presents for follow-up of renal cell carcinoma  Oncology History  Renal cell carcinoma of left kidney (HCC)  05/23/2019 Initial Diagnosis   Renal cell carcinoma of left kidney   -05/02/2019-05/07/2019 due to gross hematuria. CT showed a 7.4 cm left lower pole renal mass concerning for RCC.  Patient also has necrotic  lymphadenopathy in the mediastinum and the right supra hilar region which were highly suspicious for metastatic disease.  Bilateral lung nodules, also concerning for metastatic lung disease.  Right retrocrural lymphadenopathy with upper normal left periaortic lymph node. Patient was recommended for cytoreductive radical nephrectomy.  With history of ANCA positive vasculitis, also recommend thoracic lymphadenopathy biopsy to confirm distal metastasis.  Patient agreed to radical nephrectomy and declined bronchoscopy biopsy. He underwent left radical nephrectomy on 05/05/2019. Pathology showed pT3a pNx, RCC, conventional clear cell type, grade 3    05/23/2019 Cancer Staging   Staging form: Kidney, AJCC 8th Edition - Pathologic: Stage IV (pT3a, pNX, cM1) - Signed by [name], MD on 05/23/2019   06/07/2019 - 09/25/2021 Chemotherapy   Keytruda + Axitinib.  06/07/19 He started Axitinib 5mg  BID, stopped on 07/25/2020 due to AKI, resumed on 10/04/2019, stopped again on 05/01/2020 due to diarrhea. 09/11/2020 resumed axitinib 5 mg twice daily, stopped on 10/23/2020 due to diarrhea.  01/17/2021 Keytruda was held due to transaminitis, immunotherapy induced liver toxicities. 02/20/2021, resumed Keytruda.  09/25/2021 last dose of Keytruda    01/12/2020 Imaging   CT showed overall marked improvement.  Comparing to CT scan in August 2021, continues to have treatment response.Mediastinal soft tissue nodule nearly completely resolved, right minor fissure nodule 8 mm, decreased in size.  Resolution of pleural fluid and thickening of the right chest.  Small pleural effusion in the left chest.  No new lung nodules.-Subtle variation of the muscle architecture of the left psoas muscle of unknown significance   04/30/2020  Imaging   CT chest abdomen pelvis without contrast showed stable disease   11/28/2020 Imaging   CT abdomen pelvis without contrast showed stable examination.  No evidence of new or progressive disease in the  chest abdomen pelvis.  Stable to minimally decreased size of 5 mm pulmonary nodule in the minor fissure in the right chest.  Sigmoid colon diverticulosis without findings of acute diverticulitis.   03/08/2021 Imaging   CT scan showed NED   07/31/2021 Imaging   CT chest abdomen pelvis showed stable disease   08/27/2021 Imaging   MRI brain with and without contrast Decrease in size of lesions presumably reflecting neurocysticercosis. No evidence new metastatic disease   08/29/2021 Imaging   CT Chest abdomen pelvis w contrast 1. No findings of active malignancy. 6 by 4 mm right upper lobe nodule along the major fissure is stable and likely represents a previously treated metastatic lesion. 2. Coronary artery atherosclerosis disproportionate to atherosclerosis elsewhere in the chest, abdomen, and pelvis. 3. Mild surgical bronchiectasis in both lower lobes. 4. Mild prostatomegaly. 5. 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 Status post left nephrectomy, without recurrent or metastatic disease. 2. Similar small right-sided pulmonary nodules. 3. New or progressive posterior left upper lobe clustered nodularity and ground-glass, suspicious for minimal infection. 4.  Coronary artery atherosclerosi   09/25/2022 Imaging   CT chest abdomen pelvis w contrast showed CHEST:   1. New nodularity in the LEFT upper lobe with new mediastinal lymphadenopathy. 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 2. No skeletal metastasis.   PELVIS:   1. No evidence of local recurrence in the LEFT nephrectomy bed. 2. No evidence of metastatic disease in the abdomen pelvis.    10/08/2022 Imaging   PET scan showed  1. Hypermetabolic mediastinal and left hilar lymphadenopathy with hypermetabolic nodularity in the posterior left upper lobe. Imaging features are compatible with metastatic disease. 2. No  evidence for metastatic disease in the abdomen or pelvis. 3. Changes of avascular necrosis in the femoral heads bilaterally.    10/10/2022 Relapse/Recurrence   S/p biopsy via bronchoscopy  1. Lung, biopsy, Left upper lobe :       - METASTATIC CLEAR CELL RENAL CELL CARCINOMA   Bronchial Brushing Lung, upper lobe  Fine Needle Aspiration  - POSITIVE FOR MALIGNANCY.       - CONSISTENT WITH METASTATIC CLEAR CELL RENAL CELL CARCINOMA.     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  Patient has been referred to orthopedic surgeon in [place] which he is not able to go due to transportation.  He does not have insurance as well.  He is currently asymptomatic.  Monitor.  Patient has past medical history CAD status post PCI to LAD in 2018, on aspirin and Plavix, CHF, history of focal sclerosing/crescentic GN-ANCA positive and CKD stage III. 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 He finished 14 days of albendazole plus praziquantel .   He has had few side effects including poor appetite, hair loss, weight loss, bloody stool, abdominal cramping or diarrhea. All symptoms have improved since he completed the treatment. 03/16/2020 he finished tapering course of steroids  #Intermittent orthopnea,Pre-existing diastolic CHF, 05/04/2019 LVEF 60 to 65%.   Recommend him to follow-up with cardiology-he is has not able to follow-up with cardiology due to lack of insurance coverage.  # He has frequent urination  at night.  Previously tried Cardura for BPH and had to stop due hypotension. 07/2022 S/p EGD and colonoscopy - diverticulosis, non bleeding internal hemorrhoids.  Gastritis.  INTERVAL HISTORY Mark Donaldson is a 62 y.o. male who has above history reviewed by me today presents for follow up visit for metastatic RCC Patient reports occasional cough, no shortness of breath.  + hoarseness.  + decreased appetite after started on  Carbozantinib. He has no nausea vomiting.  + back pain.     Review of systems- Review of Systems  Constitutional:  Negative for appetite change, chills, fatigue, fever and unexpected weight change.  HENT:   Negative for voice change.   Eyes:  Negative for eye problems and icterus.  Respiratory:  Positive for cough. Negative for chest tightness.   Cardiovascular:  Negative for chest pain and leg swelling.  Gastrointestinal:  Negative for abdominal distention, abdominal pain and diarrhea.  Endocrine: Negative for hot flashes.  Genitourinary:  Negative for difficulty urinating, dysuria, frequency and nocturia.   Musculoskeletal:  Positive for arthralgias and back pain.  Skin:  Negative for itching and rash.  Neurological:  Negative for light-headedness and numbness.  Hematological:  Negative for adenopathy. Does not bruise/bleed easily.  Psychiatric/Behavioral:  Negative for confusion.     No Known Allergies  Patient Active Problem List   Diagnosis Date Noted   Renal cell carcinoma of left kidney (HCC) 05/23/2019    Priority: High   Stage 3b chronic kidney disease (HCC) 10/04/2019    Priority: Medium    CNS lesion 07/07/2019    Priority: Medium    Essential hypertension 05/18/2016    Priority: Medium    IDA (iron deficiency anemia) 11/21/2021    Priority: Low   Encounter for antineoplastic immunotherapy 07/19/2019    Priority: Low   Encounter for antineoplastic chemotherapy 07/19/2019    Priority: Low   Goals of care, counseling/discussion 05/23/2019    Priority: Low   Need for prophylactic vaccination and inoculation against influenza 11/13/2022   Gastritis without bleeding 07/30/2022   Cough 06/03/2022   Avascular necrosis of bones of both hips (HCC) 06/20/2021   Transaminitis 01/17/2021   History of CHF (congestive heart failure) 05/01/2020   Hyperglycemia 02/28/2020   Brain lesion 10/04/2019   Anemia in stage 3b chronic kidney disease (HCC) 06/07/2019   Renal cell  carcinoma (HCC) 05/23/2019   Palliative care encounter    History of vasculitis    Thoracic lymphadenopathy    Lung nodules    CAD S/P percutaneous coronary angioplasty 05/02/2019   Kidney mass 05/02/2019   Hydronephrosis of left kidney 05/02/2019   UTI (urinary tract infection) 05/02/2019   AKI (acute kidney injury) (HCC) 05/02/2019   Gross hematuria 05/02/2019   Chronic diastolic CHF (congestive heart failure) (HCC) 05/02/2019   Sepsis (HCC) 05/02/2019   Severe sepsis (HCC) 05/02/2019   Preop cardiovascular exam    Hyperlipidemia 05/21/2016   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) 05/21/2016   Prediabetes 05/21/2016   Alcohol abuse 05/18/2016   Vasculitis, 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HCC) 05/18/2016   Shortness of breath 05/17/2016     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HCC)    Avascular necrosis of bones of both hips (HCC)    CAD (coronary artery disease)    Essential hypertension    Gastritis without bleeding    HFrEF (heart failure with reduced ejection fraction) (HCC)    Hyperlipidemia LDL goal <70    Iron deficiency anemia  Ischemic cardiomyopathy    Lung nodules 09/2022   left upper lobe   Mediastinal adenopathy 09/2022   Renal cell carcinoma (HCC) 05/23/2019   Renal disorder    Severe sepsis (HCC)    Stage 3b chronic kidney disease (CKD) (HCC)      Past Surgical History:  Procedure Laterality Date   BIOPSY  07/30/2022   Procedure: BIOPSY;  Surgeon: Midge Minium, MD;  Location: [facility];  Service: Endoscopy;;   COLONOSCOPY WITH PROPOFOL N/A 07/30/2022   Procedure: COLONOSCOPY WITH PROPOFOL;  Surgeon: Midge Minium, MD;  Location: [facility];  Service: Endoscopy;  Laterality: N/A;  SPANISH INTERPRETER   ENDOBRONCHIAL ULTRASOUND Left 10/10/2022   Procedure: ENDOBRONCHIAL ULTRASOUND;  Surgeon: Salena Saner, MD;  Location: [facility];  Service: Pulmonary;  Laterality: Left;   ESOPHAGOGASTRODUODENOSCOPY (EGD)  WITH PROPOFOL N/A 07/30/2022   Procedure: ESOPHAGOGASTRODUODENOSCOPY (EGD) WITH PROPOFOL;  Surgeon: Midge Minium, MD;  Location: [facility];  Service: Endoscopy;  Laterality: N/A;   LAPAROSCOPIC NEPHRECTOMY, HAND ASSISTED Left 05/05/2019   Procedure: HAND ASSISTED LAPAROSCOPIC NEPHRECTOMY;  Surgeon: Vanna Scotland, MD;  Location: [facility] OR[facility];  Service: Urology;  Laterality: Left;   LEFT HEART CATH AND CORONARY ANGIOGRAPHY  05/20/2016   LAD lesion s/p PCI with Xience    Social History   Socioeconomic History   Marital status: Married    Spouse name: Tuan   Number of children: 4   Years of education: Not on file   Highest education level: Not on file  Occupational History   Not on file  Tobacco Use   Smoking status: Never   Smokeless tobacco: Never  Vaping Use   Vaping status: Never Used  Substance and Sexual Activity   Alcohol use: Not Currently   Drug use: Never   Sexual activity: Not on file  Other Topics Concern   Not on file  Social History Narrative   Not on file   Social Determinants of Health   Financial Resource Strain: Not on file  Food Insecurity: No Food Insecurity (07/14/2022)   [facility]   Hunger Vital Sign    Worried About Running Out of Food in the Last Year: Never true    Ran Out of Food in the Last Year: Never true  Transportation Needs: No Transportation Needs (07/14/2022)   [facility]   [facility] - Transportation    Lack of Transportation (Medical): No    Lack of Transportation (Non-Medical): No  Physical Activity: Not on file  Stress: Not on file  Social Connections: Not on file  Intimate Partner Violence: Not on file     Family History  Problem Relation Age of Onset   Cancer Mother    Blindness Brother      Current Outpatient Medications:    albuterol (VENTOLIN HFA) 108 (90 Base) MCG/ACT inhaler, Inhale 2 puffs into the lungs every 6 (six) hours as needed  for wheezing or shortness of breath., Disp: 6.7 g, Rfl: 2   aspirin 81 MG EC tablet, Take 81 mg by mouth daily. Swallow whole., Disp: , Rfl:    atorvastatin (LIPITOR) 80 MG tablet, Take 80 mg by mouth at bedtime., Disp: , Rfl:    cabozantinib (CABOMETYX) 60 MG tablet, Take 60 mg by mouth daily. Take on an empty stomach, 1 hour before or 2 hours after meals., Disp: , Rfl:    clopidogrel (PLAVIX) 75 MG tablet, Take 75 mg by mouth daily., Disp: , Rfl:  ferrous sulfate 325 (65 FE) MG tablet, Take 1 tablet (325 mg total) by mouth 2 (two) times daily with a meal., Disp: 180 tablet, Rfl: 2   Fluticasone-Umeclidin-Vilant (TRELEGY ELLIPTA) 100-62.5-25 MCG/ACT AEPB, Inhale 1 puff into the lungs daily., Disp: 60 each, Rfl: 11   furosemide (LASIX) 20 MG tablet, Take 1 tablet (20 mg total) by mouth daily., Disp: 30 tablet, Rfl: 3   losartan (COZAAR) 50 MG tablet, Take 50 mg by mouth daily., Disp: , Rfl:    metoprolol tartrate (LOPRESSOR) 100 MG tablet, Take 100 mg by mouth daily., Disp: , Rfl:    Physical exam:  Vitals:   11/13/22 1018 11/13/22 1029  BP: (!) 175/115 (!) 150/118  Pulse: 75   Resp: 18   Temp: (!) 97.1 F (36.2 C)   Weight: 151 lb 9.6 oz (68.8 kg)     Physical Exam Constitutional:      General: He is not in acute distress.    Appearance: He is not diaphoretic.  HENT:     Head: Normocephalic and atraumatic.  Eyes:     General: No scleral icterus.    Pupils: Pupils are equal, round, and reactive to light.  Cardiovascular:     Rate and Rhythm: Normal rate and regular rhythm.     Heart sounds: No murmur heard. Pulmonary:     Effort: Pulmonary effort is normal. No respiratory distress.  Chest:     Chest wall: No tenderness.  Abdominal:     General: There is no distension.     Palpations: Abdomen is soft.     Tenderness: There is no abdominal tenderness.  Musculoskeletal:        General: Normal range of motion.     Cervical back: Normal range of motion and neck supple.   Skin:    General: Skin is warm and dry.     Findings: No erythema.  Neurological:     Mental Status: He is alert and oriented to person, place, and time. Mental status is at baseline.     Cranial Nerves: No cranial nerve deficit.     Motor: No abnormal muscle tone.  Psychiatric:        Mood and Affect: Mood and affect normal.       Labs    Latest Ref Rng & Units 11/13/2022   10:04 AM 10/02/2022    1:20 PM 03/25/2022    1:18 PM  CBC  WBC 4.0 - 10.5 K/uL 8.2  8.9  10.4   Hemoglobin 13.0 - 17.0 g/dL 13.2  44.0  10.2   Hematocrit 39.0 - 52.0 % 51.2  47.0  41.5   Platelets 150 - 400 K/uL 115  162  172       Latest Ref Rng & Units 11/13/2022   10:04 AM 10/02/2022    1:20 PM 03/25/2022    1:18 PM  CMP  Glucose 70 - 99 mg/dL 725  95  366   BUN 6 - 20 mg/dL 26  36  40   Creatinine 0.61 - 1.24 mg/dL 4.40  3.47  4.25   Sodium 135 - 145 mmol/L 134  138  139   Potassium 3.5 - 5.1 mmol/L 4.2  4.4  4.4   Chloride 98 - 111 mmol/L 102  105  107   CO2 22 - 32 mmol/L 24  23  24    Calcium 8.9 - 10.3 mg/dL 9.2  9.4  9.2   Total Protein 6.5 - 8.1 g/dL 6.7  6.9  7.1   Total Bilirubin 0.3 - 1.2 mg/dL 0.9  0.9  0.7   Alkaline Phos 38 - 126 U/L 168  87  97   AST 15 - 41 U/L 78  29  30   ALT 0 - 44 U/L 62  27  21       RADIOGRAPHIC STUDIES: I have personally reviewed the radiological images as listed and agreed with the findings in the report. DG Chest Port 1 View  Result Date: 10/10/2022 CLINICAL DATA:  Status post bronchoscopy and biopsy. Concern for pneumothorax. Renal cell carcinoma. EXAM: PORTABLE CHEST 1 VIEW COMPARISON:  Chest radiograph dated 08/04/2019 and CT dated 10/08/2022. FINDINGS: Left suprahilar density corresponding to the nodularity seen on the prior CT and PET. No new consolidation. There is no pleural effusion or pneumothorax. The cardiac silhouette is within normal limits. No acute osseous pathology. IMPRESSION: 1. No pneumothorax. 2. Left suprahilar nodularity.  Electronically Signed   By: Elgie Collard M.D.   On: 10/10/2022 16:20   DG C-Arm 1-60 Min-No Report  Result Date: 10/10/2022 Fluoroscopy was utilized by the requesting physician.  No radiographic interpretation.   CT SUPER D CHEST WO MONARCH PILOT  Result Date: 10/08/2022 CLINICAL DATA:  Lung nodules. 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 EXAM: CT CHEST WITHOUT CONTRAST TECHNIQUE: Multidetector CT imaging of the chest was performed using thin slice collimation for electromagnetic bronchoscopy planning purposes, without intravenous contrast. RADIATION DOSE REDUCTION: This exam was performed according to the departmental dose-optimization program which includes automated exposure control, adjustment of the mA and/or kV according to patient size and/or use of iterative reconstruction technique. COMPARISON:  09/25/2022, 03/19/2022, 11/18/2021, 08/28/2021 and 11/28/2020. FINDINGS: Cardiovascular: Atherosclerotic calcification of the aortic valve with age advanced involvement of the coronary arteries. Heart size normal. No pericardial effusion. Mediastinum/Nodes: Mediastinal lymph nodes measure up to 2.7 cm in the AP window, as on 09/25/2022 but new from 03/19/2022. Hilar regions are difficult to evaluate without IV contrast. No axillary adenopathy. Esophagus is grossly unremarkable. Lungs/Pleura: Nodular consolidation and ground-glass in the posterior left upper lobe, unchanged from 09/25/2022 but progressive from 03/19/2022. No pleural fluid. There may be slight narrowing of left upper lobe bronchi. Airway is otherwise unremarkable. Upper Abdomen: Visualized portions of the liver, gallbladder, adrenal glands, right kidney, spleen, pancreas, stomach and bowel are grossly unremarkable. Left nephrectomy. No upper abdominal adenopathy. Musculoskeletal: None. IMPRESSION: 1. Mediastinal adenopathy with increasing nodular consolidation and ground-glass in the posterior left upper  lobe, findings worrisome for metastatic disease. 2. Today's PET dictated separately. 3. Age advanced coronary artery calcification. Electronically Signed   By: Leanna Battles M.D.   On: 10/08/2022 14:37   NM PET Image Restag (PS) Skull Base To Thigh  Result Date: 10/08/2022 CLINICAL DATA:  Subsequent treatment strategy for renal cell carcinoma. EXAM: NUCLEAR MEDICINE PET SKULL BASE TO THIGH TECHNIQUE: 8 mCi F-18 FDG was injected intravenously. Full-ring PET imaging was performed from the skull base to thigh after the radiotracer. CT data was obtained and used for attenuation correction and anatomic localization. Fasting blood glucose: 81 mg/dl COMPARISON:  Chest abdomen pelvis CT 09/25/2022.  PET-CT 09/08/2019 FINDINGS: Mediastinal blood pool activity: SUV max 2.3 Liver activity: SUV max NA NECK: No hypermetabolic lymph nodes in the neck. Incidental CT findings: None. CHEST: Mediastinal left hilar lymphadenopathy identified on the recent chest CT is hypermetabolic. Pre-vascular and AP window lymphadenopathy demonstrates SUV max = 9.5. Left hilar hypermetabolism noted with SUV max = 10.6. The nodularity in  the posterior left upper lobe is hypermetabolic with SUV max = 7.3. Incidental CT findings: Coronary artery calcification is evident. Dependent atelectasis noted in the lower lungs bilaterally. ABDOMEN/PELVIS: No abnormal hypermetabolic activity within the liver, pancreas, adrenal glands, or spleen. No hypermetabolic lymph nodes in the abdomen or pelvis. Incidental CT findings: Left nephrectomy. Diverticular disease noted left colon without diverticulitis. SKELETON: No focal hypermetabolic activity to suggest skeletal metastasis. Incidental CT findings: No worrisome lytic or sclerotic osseous abnormality. Changes of avascular necrosis noted in the femoral heads bilaterally. IMPRESSION: 1. Hypermetabolic mediastinal and left hilar lymphadenopathy with hypermetabolic nodularity in the posterior left upper lobe.  Imaging features are compatible with metastatic disease. 2. No evidence for metastatic disease in the abdomen or pelvis. 3. Changes of avascular necrosis in the femoral heads bilaterally. Electronically Signed   By: Kennith Center M.D.   On: 10/08/2022 12:55   CT CHEST ABDOMEN PELVIS WO CONTRAST  Result Date: 10/01/2022 CLINICAL DATA:  Renal cell carcinoma LEFT kidney. Status post 34 cycles immunotherapy. Assess treatment response. * Tracking Code: BO * EXAM: CT CHEST, ABDOMEN AND PELVIS WITHOUT CONTRAST TECHNIQUE: Multidetector CT imaging of the chest, abdomen and pelvis was performed following the standard protocol without IV contrast. RADIATION DOSE REDUCTION: This exam was performed according to the departmental dose-optimization program which includes automated exposure control, adjustment of the mA and/or kV according to patient size and/or use of iterative reconstruction technique. COMPARISON:  05/19/2022 FINDINGS: CT CHEST FINDINGS Cardiovascular: No significant vascular findings. Normal heart size. No pericardial effusion. Mediastinum/Nodes: Multiple newly enlarged mediastinal lymph nodes. For example prevascular lymph node measuring 20 mm (image 26/2) centrally new from prior. RIGHT lower paratracheal node measuring 20 mm increased from 5 mm. Lungs/Pleura: New nodularity in the LEFT upper lobe at site mild interstitial thickening. Nodule along the oblique fissure in the LEFT upper lobe measures 11 mm (image 52/4 nodule in the LEFT suprahilar lung adjacent to the pulmonary vasculature measures 10 mm (image 59/4. No RIGHT lung nodules. Musculoskeletal: No aggressive osseous lesion. CT ABDOMEN AND PELVIS FINDINGS Hepatobiliary: No focal hepatic lesion. No biliary ductal dilatation. Gallbladder is normal. Common bile duct is normal. Pancreas: Pancreas is normal. No ductal dilatation. No pancreatic inflammation. Spleen: Normal spleen Adrenals/urinary tract: Adrenal glands normal. Post LEFT nephrectomy. No  nodularity in the nephrectomy bed. RIGHT kidney normal.  RIGHT ureter and bladder normal. Stomach/Bowel: Stomach, small bowel, appendix, and cecum are normal. The colon and rectosigmoid colon are normal. Vascular/Lymphatic: Abdominal aorta is normal caliber. There is no retroperitoneal or periportal lymphadenopathy. No pelvic lymphadenopathy. Reproductive: Prostate normal Other: No free fluid. Musculoskeletal: No aggressive osseous lesion. IMPRESSION: CHEST: 1. New nodularity in the LEFT upper lobe with new mediastinal lymphadenopathy. 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 2. No skeletal metastasis. PELVIS: 1. No evidence of local recurrence in the LEFT nephrectomy bed. 2. No evidence of metastatic disease in the abdomen pelvis. Electronically Signed   By: Genevive Bi M.D.   On: 10/01/2022 11:30

## 2022-11-18 ENCOUNTER — Ambulatory Visit
Admission: RE | Admit: 2022-11-18 | Discharge: 2022-11-18 | Disposition: A | Discharge status: Home / Self Care | Payer: Self-pay | Source: Ambulatory Visit | Attending: Oncology | Admitting: Oncology

## 2022-11-18 DIAGNOSIS — C642 Malignant neoplasm of left kidney, except renal pelvis: Secondary | ICD-10-CM | POA: Insufficient documentation

## 2022-11-18 MED ORDER — GADOBUTROL 1 MMOL/ML IV SOLN
7.0000 mL | Freq: Once | INTRAVENOUS | Status: AC | PRN
  Administered 2022-11-18: 7 mL via INTRAVENOUS

## 2022-11-21 ENCOUNTER — Telehealth: Payer: Self-pay

## 2022-11-21 NOTE — Telephone Encounter (Signed)
Received message from interpreter stating that pt had contacted her and told her that he was pooping blood and wasnt sure if he needed to be seen.   2 Attempts were made to call pt to advise him  to go to ER for further eval. Pt did not answer phone and unable to leave VM.

## 2022-11-24 ENCOUNTER — Encounter: Payer: Self-pay | Admitting: Oncology

## 2022-11-24 ENCOUNTER — Inpatient Hospital Stay: Payer: Self-pay | Attending: Oncology | Admitting: Oncology

## 2022-11-24 VITALS — BP 144/117 | Temp 96.6°F | Resp 18

## 2022-11-24 DIAGNOSIS — N1832 Chronic kidney disease, stage 3b: Secondary | ICD-10-CM

## 2022-11-24 DIAGNOSIS — Z905 Acquired absence of kidney: Secondary | ICD-10-CM | POA: Insufficient documentation

## 2022-11-24 DIAGNOSIS — C642 Malignant neoplasm of left kidney, except renal pelvis: Secondary | ICD-10-CM

## 2022-11-24 DIAGNOSIS — G969 Disorder of central nervous system, unspecified: Secondary | ICD-10-CM

## 2022-11-24 DIAGNOSIS — I10 Essential (primary) hypertension: Secondary | ICD-10-CM

## 2022-11-24 DIAGNOSIS — K625 Hemorrhage of anus and rectum: Secondary | ICD-10-CM

## 2022-11-24 NOTE — Assessment & Plan Note (Signed)
He has internal hemorrhoids.  Recommend patient to follow up with GI.  Hold Plavix. Hold Carbozantinib

## 2022-11-24 NOTE — Assessment & Plan Note (Addendum)
Not well controlled, due to Carbozantinib Losartan to 50mg  BID.

## 2022-11-24 NOTE — Progress Notes (Signed)
Hematology/Oncology Progress note [phone] [phone]    REASON OF VISIT  renal cell carcinoma   ASSESSMENT & PLAN:   Cancer Staging  Renal cell carcinoma of left kidney (HCC) Staging form: Kidney, AJCC 8th Edition - Pathologic: Stage IV (pT3a, pNX, cM1) - Signed by [name], MD on 05/23/2019   Essential hypertension Not well controlled, due to Carbozantinib Losartan to 50mg  BID.    Stage 3b chronic kidney disease (HCC) avoid nephrotoxins. 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  S/p 34 cycles of Keytruda [ about 2 years's treatment] - stopped in Sept 2023 --> 09/2022 Biopsy via bronchoscopy pathology showed disease recurrence, metastatic clear cell carcinoma Labs are reviewed and discussed with patient.  Hold cabozantinib 60mg  daily due to rectal bleeding.   CNS lesion 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 Repeat MRI brain  Rectal bleeding He has internal hemorrhoids.  Recommend patient to follow up with GI.  Hold Plavix. Hold Carbozantinib   No orders of the defined types were placed in this encounter.   Patient is Spanish-speaking.   Spanish interpreter service utilized for entire encounter   Return visit  2 weeks after starting treatment.  All questions were answered. The patient knows to call the clinic with any problems, questions or concerns.  [name], MD, PhD [facility] Hematology Oncology 11/24/2022   PERTINENT HISTORY-  62 y.o. male presents for follow-up of renal cell carcinoma  Oncology History  Renal cell carcinoma of left kidney (HCC)  05/23/2019 Initial Diagnosis   Renal cell carcinoma of left kidney   -05/02/2019-05/07/2019 due to gross hematuria. CT showed a 7.4 cm left lower pole renal mass concerning for RCC.  Patient also has  necrotic lymphadenopathy in the mediastinum and the right supra hilar region which were highly suspicious for metastatic disease.  Bilateral lung nodules, also concerning for metastatic lung disease.  Right retrocrural lymphadenopathy with upper normal left periaortic lymph node. Patient was recommended for cytoreductive radical nephrectomy.  With history of ANCA positive vasculitis, also recommend thoracic lymphadenopathy biopsy to confirm distal metastasis.  Patient agreed to radical nephrectomy and declined bronchoscopy biopsy. He underwent left radical nephrectomy on 05/05/2019. Pathology showed pT3a pNx, RCC, conventional clear cell type, grade 3    05/23/2019 Cancer Staging   Staging form: Kidney, AJCC 8th Edition - Pathologic: Stage IV (pT3a, pNX, cM1) - Signed by [name], MD on 05/23/2019   06/07/2019 - 09/25/2021 Chemotherapy   Keytruda + Axitinib.  06/07/19 He started Axitinib 5mg  BID, stopped on 07/25/2020 due to AKI, resumed on 10/04/2019, stopped again on 05/01/2020 due to diarrhea. 09/11/2020 resumed axitinib 5 mg twice daily, stopped on 10/23/2020 due to diarrhea.  01/17/2021 Keytruda was held due to transaminitis, immunotherapy induced liver toxicities. 02/20/2021, resumed Keytruda.  09/25/2021 last dose of Keytruda    01/12/2020 Imaging   CT showed overall marked improvement.  Comparing to CT scan in August 2021, continues to have treatment response.Mediastinal soft tissue nodule nearly completely resolved, right minor fissure nodule 8 mm, decreased in size.  Resolution of pleural fluid and thickening of the right chest.  Small pleural effusion in the left chest.  No new lung nodules.-Subtle variation of the muscle architecture of the left psoas muscle of unknown significance   04/30/2020 Imaging   CT chest abdomen pelvis without contrast showed stable disease  11/28/2020 Imaging   CT abdomen pelvis without contrast showed stable examination.  No evidence of new or progressive disease in  the chest abdomen pelvis.  Stable to minimally decreased size of 5 mm pulmonary nodule in the minor fissure in the right chest.  Sigmoid colon diverticulosis without findings of acute diverticulitis.   03/08/2021 Imaging   CT scan showed NED   07/31/2021 Imaging   CT chest abdomen pelvis showed stable disease   08/27/2021 Imaging   MRI brain with and without contrast Decrease in size of lesions presumably reflecting neurocysticercosis. No evidence new metastatic disease   08/29/2021 Imaging   CT Chest abdomen pelvis w contrast 1. No findings of active malignancy. 6 by 4 mm right upper lobe nodule along the major fissure is stable and likely represents a previously treated metastatic lesion. 2. Coronary artery atherosclerosis disproportionate to atherosclerosis elsewhere in the chest, abdomen, and pelvis. 3. Mild surgical bronchiectasis in both lower lobes. 4. Mild prostatomegaly. 5. 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 Status post left nephrectomy, without recurrent or metastatic disease. 2. Similar small right-sided pulmonary nodules. 3. New or progressive posterior left upper lobe clustered nodularity and ground-glass, suspicious for minimal infection. 4.  Coronary artery atherosclerosi   09/25/2022 Imaging   CT chest abdomen pelvis w contrast showed CHEST:   1. New nodularity in the LEFT upper lobe with new mediastinal lymphadenopathy. 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 2. No skeletal metastasis.   PELVIS:   1. No evidence of local recurrence in the LEFT nephrectomy bed. 2. No evidence of metastatic disease in the abdomen pelvis.    10/08/2022 Imaging   PET scan showed  1. Hypermetabolic mediastinal and left hilar lymphadenopathy with hypermetabolic nodularity in the posterior left upper lobe. Imaging features are compatible with metastatic disease. 2. No  evidence for metastatic disease in the abdomen or pelvis. 3. Changes of avascular necrosis in the femoral heads bilaterally.    10/10/2022 Relapse/Recurrence   S/p biopsy via bronchoscopy  1. Lung, biopsy, Left upper lobe :       - METASTATIC CLEAR CELL RENAL CELL CARCINOMA   Bronchial Brushing Lung, upper lobe  Fine Needle Aspiration  - POSITIVE FOR MALIGNANCY.       - CONSISTENT WITH METASTATIC CLEAR CELL RENAL CELL CARCINOMA.     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  Patient has been referred to orthopedic surgeon in [place] which he is not able to go due to transportation.  He does not have insurance as well.  He is currently asymptomatic.  Monitor.  Patient has past medical history CAD status post PCI to LAD in 2018, on aspirin and Plavix, CHF, history of focal sclerosing/crescentic GN-ANCA positive and CKD stage III. 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 He finished 14 days of albendazole plus praziquantel .   He has had few side effects including poor appetite, hair loss, weight loss, bloody stool, abdominal cramping or diarrhea. All symptoms have improved since he completed the treatment. 03/16/2020 he finished tapering course of steroids  #Intermittent orthopnea,Pre-existing diastolic CHF, 05/04/2019 LVEF 60 to 65%.   Recommend him to follow-up with cardiology-he is has not able to follow-up with cardiology due to lack of insurance coverage.  # He has frequent urination at night.  Previously tried Cardura for BPH and had to stop due hypotension.  07/2022 S/p EGD and colonoscopy - diverticulosis, non bleeding internal hemorrhoids.  Gastritis.  INTERVAL HISTORY Mark Donaldson is a 62 y.o. male who has above history reviewed by me today presents for follow up visit for metastatic RCC Patient has appointment later this week and he presented to the office and requesting appointments to be moved up to today due  to concern of rectal bleeding. Patient reports occasional cough, no shortness of breath.  + decreased appetite after started on Carbozantinib. He has no nausea vomiting.  He has noticed small amount of bright red rectal bleeding since the start of medication. Patient has a history of internal hemorrhoids, last colonoscopy was this year.     Review of systems- Review of Systems  Constitutional:  Negative for appetite change, chills, fatigue, fever and unexpected weight change.  HENT:   Negative for voice change.   Eyes:  Negative for eye problems and icterus.  Respiratory:  Positive for cough. Negative for chest tightness.   Cardiovascular:  Negative for chest pain and leg swelling.  Gastrointestinal:  Positive for blood in stool. Negative for abdominal distention, abdominal pain, diarrhea and rectal pain.  Endocrine: Negative for hot flashes.  Genitourinary:  Negative for difficulty urinating, dysuria, frequency and nocturia.   Musculoskeletal:  Positive for arthralgias and back pain.  Skin:  Negative for itching and rash.  Neurological:  Negative for light-headedness and numbness.  Hematological:  Negative for adenopathy. Does not bruise/bleed easily.  Psychiatric/Behavioral:  Negative for confusion.     No Known Allergies  Patient Active Problem List   Diagnosis Date Noted   Renal cell carcinoma of left kidney (HCC) 05/23/2019    Priority: High   Rectal bleeding 11/24/2022    Priority: Medium    Stage 3b chronic kidney disease (HCC) 10/04/2019    Priority: Medium    CNS lesion 07/07/2019    Priority: Medium    Essential hypertension 05/18/2016    Priority: Medium    IDA (iron deficiency anemia) 11/21/2021    Priority: Low   Encounter for antineoplastic immunotherapy 07/19/2019    Priority: Low   Encounter for antineoplastic chemotherapy 07/19/2019    Priority: Low   Goals of care, counseling/discussion 05/23/2019    Priority: Low   Need for prophylactic vaccination  and inoculation against influenza 11/13/2022   Gastritis without bleeding 07/30/2022   Cough 06/03/2022   Avascular necrosis of bones of both hips (HCC) 06/20/2021   Transaminitis 01/17/2021   History of CHF (congestive heart failure) 05/01/2020   Hyperglycemia 02/28/2020   Brain lesion 10/04/2019   Anemia in stage 3b chronic kidney disease (HCC) 06/07/2019   Renal cell carcinoma (HCC) 05/23/2019   Palliative care encounter    History of vasculitis    Thoracic lymphadenopathy    Lung nodules    CAD S/P percutaneous coronary angioplasty 05/02/2019   Kidney mass 05/02/2019   Hydronephrosis of left kidney 05/02/2019   UTI (urinary tract infection) 05/02/2019   AKI (acute kidney injury) (HCC) 05/02/2019   Gross hematuria 05/02/2019   Chronic diastolic CHF (congestive heart failure) (HCC) 05/02/2019   Sepsis (HCC) 05/02/2019   Severe sepsis (HCC) 05/02/2019   Preop cardiovascular exam    Hyperlipidemia 05/21/2016   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) 05/21/2016   Prediabetes 05/21/2016   Alcohol abuse 05/18/2016   Vasculitis, 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HCC) 05/18/2016   Shortness of breath 05/17/2016     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HCC)  Avascular necrosis of bones of both hips (HCC)    CAD (coronary artery disease)    Essential hypertension    Gastritis without bleeding    HFrEF (heart failure with reduced ejection fraction) (HCC)    Hyperlipidemia LDL goal <70    Iron deficiency anemia    Ischemic cardiomyopathy    Lung nodules 09/2022   left upper lobe   Mediastinal adenopathy 09/2022   Renal cell carcinoma (HCC) 05/23/2019   Renal disorder    Severe sepsis (HCC)    Stage 3b chronic kidney disease (CKD) (HCC)      Past Surgical History:  Procedure Laterality Date   BIOPSY  07/30/2022   Procedure: BIOPSY;  Surgeon: Midge Minium, MD;  Location: [facility];  Service: Endoscopy;;   COLONOSCOPY WITH PROPOFOL N/A  07/30/2022   Procedure: COLONOSCOPY WITH PROPOFOL;  Surgeon: Midge Minium, MD;  Location: [facility];  Service: Endoscopy;  Laterality: N/A;  SPANISH INTERPRETER   ENDOBRONCHIAL ULTRASOUND Left 10/10/2022   Procedure: ENDOBRONCHIAL ULTRASOUND;  Surgeon: Salena Saner, MD;  Location: [facility] ORS;  Service: Pulmonary;  Laterality: Left;   ESOPHAGOGASTRODUODENOSCOPY (EGD) WITH PROPOFOL N/A 07/30/2022   Procedure: ESOPHAGOGASTRODUODENOSCOPY (EGD) WITH PROPOFOL;  Surgeon: Midge Minium, MD;  Location: [facility];  Service: Endoscopy;  Laterality: N/A;   LAPAROSCOPIC NEPHRECTOMY, HAND ASSISTED Left 05/05/2019   Procedure: HAND ASSISTED LAPAROSCOPIC NEPHRECTOMY;  Surgeon: Vanna Scotland, MD;  Location: [facility] OR[facility];  Service: Urology;  Laterality: Left;   LEFT HEART CATH AND CORONARY ANGIOGRAPHY  05/20/2016   LAD lesion s/p PCI with Xience    Social History   Socioeconomic History   Marital status: Married    Spouse name: Alon   Number of children: 4   Years of education: Not on file   Highest education level: Not on file  Occupational History   Not on file  Tobacco Use   Smoking status: Never   Smokeless tobacco: Never  Vaping Use   Vaping status: Never Used  Substance and Sexual Activity   Alcohol use: Not Currently   Drug use: Never   Sexual activity: Not on file  Other Topics Concern   Not on file  Social History Narrative   Not on file   Social Determinants of Health   Financial Resource Strain: Not on file  Food Insecurity: No Food Insecurity (07/14/2022)   [facility]   Hunger Vital Sign    Worried About Running Out of Food in the Last Year: Never true    Ran Out of Food in the Last Year: Never true  Transportation Needs: No Transportation Needs (07/14/2022)   [facility]   [facility] - Transportation    Lack of Transportation (Medical): No    Lack of Transportation (Non-Medical): No   Physical Activity: Not on file  Stress: Not on file  Social Connections: Not on file  Intimate Partner Violence: Not on file     Family History  Problem Relation Age of Onset   Cancer Mother    Blindness Brother      Current Outpatient Medications:    albuterol (VENTOLIN HFA) 108 (90 Base) MCG/ACT inhaler, Inhale 2 puffs into the lungs every 6 (six) hours as needed for wheezing or shortness of breath., Disp: 6.7 g, Rfl: 2   aspirin 81 MG EC tablet, Take 81 mg by mouth daily. Swallow whole., Disp: , Rfl:    atorvastatin (LIPITOR) 80 MG tablet, Take 80 mg by mouth at bedtime.,  Disp: , Rfl:    cabozantinib (CABOMETYX) 60 MG tablet, Take 60 mg by mouth daily. Take on an empty stomach, 1 hour before or 2 hours after meals., Disp: , Rfl:    clopidogrel (PLAVIX) 75 MG tablet, Take 75 mg by mouth daily., Disp: , Rfl:    ferrous sulfate 325 (65 FE) MG tablet, Take 1 tablet (325 mg total) by mouth 2 (two) times daily with a meal., Disp: 180 tablet, Rfl: 2   Fluticasone-Umeclidin-Vilant (TRELEGY ELLIPTA) 100-62.5-25 MCG/ACT AEPB, Inhale 1 puff into the lungs daily., Disp: 60 each, Rfl: 11   furosemide (LASIX) 20 MG tablet, Take 1 tablet (20 mg total) by mouth daily., Disp: 30 tablet, Rfl: 3   losartan (COZAAR) 50 MG tablet, Take 50 mg by mouth 2 (two) times daily., Disp: , Rfl:    metoprolol tartrate (LOPRESSOR) 100 MG tablet, Take 100 mg by mouth daily., Disp: , Rfl:    Physical exam:  Vitals:   11/24/22 1512  BP: (!) 144/117  Resp: 18  Temp: (!) 96.6 F (35.9 C)    Physical Exam Constitutional:      General: He is not in acute distress.    Appearance: He is not diaphoretic.  HENT:     Head: Normocephalic and atraumatic.  Eyes:     General: No scleral icterus.    Pupils: Pupils are equal, round, and reactive to light.  Cardiovascular:     Rate and Rhythm: Normal rate and regular rhythm.     Heart sounds: No murmur heard. Pulmonary:     Effort: Pulmonary effort is normal. No  respiratory distress.  Chest:     Chest wall: No tenderness.  Abdominal:     General: There is no distension.     Palpations: Abdomen is soft.     Tenderness: There is no abdominal tenderness.  Musculoskeletal:        General: Normal range of motion.     Cervical back: Normal range of motion and neck supple.  Skin:    General: Skin is warm and dry.     Findings: No erythema.  Neurological:     Mental Status: He is alert and oriented to person, place, and time. Mental status is at baseline.     Cranial Nerves: No cranial nerve deficit.     Motor: No abnormal muscle tone.  Psychiatric:        Mood and Affect: Mood and affect normal.       Labs    Latest Ref Rng & Units 11/13/2022   10:04 AM 10/02/2022    1:20 PM 03/25/2022    1:18 PM  CBC  WBC 4.0 - 10.5 K/uL 8.2  8.9  10.4   Hemoglobin 13.0 - 17.0 g/dL 16.1  09.6  04.5   Hematocrit 39.0 - 52.0 % 51.2  47.0  41.5   Platelets 150 - 400 K/uL 115  162  172       Latest Ref Rng & Units 11/13/2022   10:04 AM 10/02/2022    1:20 PM 03/25/2022    1:18 PM  CMP  Glucose 70 - 99 mg/dL 409  95  811   BUN 6 - 20 mg/dL 26  36  40   Creatinine 0.61 - 1.24 mg/dL 9.14  7.82  9.56   Sodium 135 - 145 mmol/L 134  138  139   Potassium 3.5 - 5.1 mmol/L 4.2  4.4  4.4   Chloride 98 - 111 mmol/L 102  105  107   CO2 22 - 32 mmol/L 24  23  24    Calcium 8.9 - 10.3 mg/dL 9.2  9.4  9.2   Total Protein 6.5 - 8.1 g/dL 6.7  6.9  7.1   Total Bilirubin 0.3 - 1.2 mg/dL 0.9  0.9  0.7   Alkaline Phos 38 - 126 U/L 168  87  97   AST 15 - 41 U/L 78  29  30   ALT 0 - 44 U/L 62  27  21       RADIOGRAPHIC STUDIES: I have personally reviewed the radiological images as listed and agreed with the findings in the report. DG Chest Port 1 View  Result Date: 10/10/2022 CLINICAL DATA:  Status post bronchoscopy and biopsy. Concern for pneumothorax. Renal cell carcinoma. EXAM: PORTABLE CHEST 1 VIEW COMPARISON:  Chest radiograph dated 08/04/2019 and CT dated  10/08/2022. FINDINGS: Left suprahilar density corresponding to the nodularity seen on the prior CT and PET. No new consolidation. There is no pleural effusion or pneumothorax. The cardiac silhouette is within normal limits. No acute osseous pathology. IMPRESSION: 1. No pneumothorax. 2. Left suprahilar nodularity. Electronically Signed   By: Elgie Collard M.D.   On: 10/10/2022 16:20   DG C-Arm 1-60 Min-No Report  Result Date: 10/10/2022 Fluoroscopy was utilized by the requesting physician.  No radiographic interpretation.   CT SUPER D CHEST WO MONARCH PILOT  Result Date: 10/08/2022 CLINICAL DATA:  Lung nodules. 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 EXAM: CT CHEST WITHOUT CONTRAST TECHNIQUE: Multidetector CT imaging of the chest was performed using thin slice collimation for electromagnetic bronchoscopy planning purposes, without intravenous contrast. RADIATION DOSE REDUCTION: This exam was performed according to the departmental dose-optimization program which includes automated exposure control, adjustment of the mA and/or kV according to patient size and/or use of iterative reconstruction technique. COMPARISON:  09/25/2022, 03/19/2022, 11/18/2021, 08/28/2021 and 11/28/2020. FINDINGS: Cardiovascular: Atherosclerotic calcification of the aortic valve with age advanced involvement of the coronary arteries. Heart size normal. No pericardial effusion. Mediastinum/Nodes: Mediastinal lymph nodes measure up to 2.7 cm in the AP window, as on 09/25/2022 but new from 03/19/2022. Hilar regions are difficult to evaluate without IV contrast. No axillary adenopathy. Esophagus is grossly unremarkable. Lungs/Pleura: Nodular consolidation and ground-glass in the posterior left upper lobe, unchanged from 09/25/2022 but progressive from 03/19/2022. No pleural fluid. There may be slight narrowing of left upper lobe bronchi. Airway is otherwise unremarkable. Upper Abdomen: Visualized portions  of the liver, gallbladder, adrenal glands, right kidney, spleen, pancreas, stomach and bowel are grossly unremarkable. Left nephrectomy. No upper abdominal adenopathy. Musculoskeletal: None. IMPRESSION: 1. Mediastinal adenopathy with increasing nodular consolidation and ground-glass in the posterior left upper lobe, findings worrisome for metastatic disease. 2. Today's PET dictated separately. 3. Age advanced coronary artery calcification. Electronically Signed   By: Leanna Battles M.D.   On: 10/08/2022 14:37   NM PET Image Restag (PS) Skull Base To Thigh  Result Date: 10/08/2022 CLINICAL DATA:  Subsequent treatment strategy for renal cell carcinoma. EXAM: NUCLEAR MEDICINE PET SKULL BASE TO THIGH TECHNIQUE: 8 mCi F-18 FDG was injected intravenously. Full-ring PET imaging was performed from the skull base to thigh after the radiotracer. CT data was obtained and used for attenuation correction and anatomic localization. Fasting blood glucose: 81 mg/dl COMPARISON:  Chest abdomen pelvis CT 09/25/2022.  PET-CT 09/08/2019 FINDINGS: Mediastinal blood pool activity: SUV max 2.3 Liver activity: SUV max NA NECK: No hypermetabolic lymph nodes in the neck. Incidental CT  findings: None. CHEST: Mediastinal left hilar lymphadenopathy identified on the recent chest CT is hypermetabolic. Pre-vascular and AP window lymphadenopathy demonstrates SUV max = 9.5. Left hilar hypermetabolism noted with SUV max = 10.6. The nodularity in the posterior left upper lobe is hypermetabolic with SUV max = 7.3. Incidental CT findings: Coronary artery calcification is evident. Dependent atelectasis noted in the lower lungs bilaterally. ABDOMEN/PELVIS: No abnormal hypermetabolic activity within the liver, pancreas, adrenal glands, or spleen. No hypermetabolic lymph nodes in the abdomen or pelvis. Incidental CT findings: Left nephrectomy. Diverticular disease noted left colon without diverticulitis. SKELETON: No focal hypermetabolic activity to  suggest skeletal metastasis. Incidental CT findings: No worrisome lytic or sclerotic osseous abnormality. Changes of avascular necrosis noted in the femoral heads bilaterally. IMPRESSION: 1. Hypermetabolic mediastinal and left hilar lymphadenopathy with hypermetabolic nodularity in the posterior left upper lobe. Imaging features are compatible with metastatic disease. 2. No evidence for metastatic disease in the abdomen or pelvis. 3. Changes of avascular necrosis in the femoral heads bilaterally. Electronically Signed   By: Kennith Center M.D.   On: 10/08/2022 12:55   CT CHEST ABDOMEN PELVIS WO CONTRAST  Result Date: 10/01/2022 CLINICAL DATA:  Renal cell carcinoma LEFT kidney. Status post 34 cycles immunotherapy. Assess treatment response. * Tracking Code: BO * EXAM: CT CHEST, ABDOMEN AND PELVIS WITHOUT CONTRAST TECHNIQUE: Multidetector CT imaging of the chest, abdomen and pelvis was performed following the standard protocol without IV contrast. RADIATION DOSE REDUCTION: This exam was performed according to the departmental dose-optimization program which includes automated exposure control, adjustment of the mA and/or kV according to patient size and/or use of iterative reconstruction technique. COMPARISON:  05/19/2022 FINDINGS: CT CHEST FINDINGS Cardiovascular: No significant vascular findings. Normal heart size. No pericardial effusion. Mediastinum/Nodes: Multiple newly enlarged mediastinal lymph nodes. For example prevascular lymph node measuring 20 mm (image 26/2) centrally new from prior. RIGHT lower paratracheal node measuring 20 mm increased from 5 mm. Lungs/Pleura: New nodularity in the LEFT upper lobe at site mild interstitial thickening. Nodule along the oblique fissure in the LEFT upper lobe measures 11 mm (image 52/4 nodule in the LEFT suprahilar lung adjacent to the pulmonary vasculature measures 10 mm (image 59/4. No RIGHT lung nodules. Musculoskeletal: No aggressive osseous lesion. CT ABDOMEN AND  PELVIS FINDINGS Hepatobiliary: No focal hepatic lesion. No biliary ductal dilatation. Gallbladder is normal. Common bile duct is normal. Pancreas: Pancreas is normal. No ductal dilatation. No pancreatic inflammation. Spleen: Normal spleen Adrenals/urinary tract: Adrenal glands normal. Post LEFT nephrectomy. No nodularity in the nephrectomy bed. RIGHT kidney normal.  RIGHT ureter and bladder normal. Stomach/Bowel: Stomach, small bowel, appendix, and cecum are normal. The colon and rectosigmoid colon are normal. Vascular/Lymphatic: Abdominal aorta is normal caliber. There is no retroperitoneal or periportal lymphadenopathy. No pelvic lymphadenopathy. Reproductive: Prostate normal Other: No free fluid. Musculoskeletal: No aggressive osseous lesion. IMPRESSION: CHEST: 1. New nodularity in the LEFT upper lobe with new mediastinal lymphadenopathy. 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 2. No skeletal metastasis. PELVIS: 1. No evidence of local recurrence in the LEFT nephrectomy bed. 2. No evidence of metastatic disease in the abdomen pelvis. Electronically Signed   By: Genevive Bi M.D.   On: 10/01/2022 11:30

## 2022-11-24 NOTE — Assessment & Plan Note (Signed)
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 Repeat MRI brain

## 2022-11-24 NOTE — Assessment & Plan Note (Signed)
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  S/p 34 cycles of Keytruda [ about 2 years's treatment] - stopped in Sept 2023 --> 09/2022 Biopsy via bronchoscopy pathology showed disease recurrence, metastatic clear cell carcinoma Labs are reviewed and discussed with patient.  Hold cabozantinib 60mg  daily due to rectal bleeding.

## 2022-11-24 NOTE — Progress Notes (Signed)
Pt reports that starting last Thursday he started having some blood in his stool. Pt reports that he has been having reflux as well.

## 2022-11-24 NOTE — Assessment & Plan Note (Signed)
avoid nephrotoxins. Encourage oral hydration 

## 2022-11-27 ENCOUNTER — Inpatient Hospital Stay: Payer: Self-pay | Admitting: Oncology

## 2022-11-27 ENCOUNTER — Inpatient Hospital Stay: Payer: Self-pay

## 2022-12-10 ENCOUNTER — Other Ambulatory Visit: Payer: Self-pay | Admitting: Pharmacist

## 2022-12-10 ENCOUNTER — Inpatient Hospital Stay (HOSPITAL_BASED_OUTPATIENT_CLINIC_OR_DEPARTMENT_OTHER): Payer: Self-pay | Admitting: Oncology

## 2022-12-10 ENCOUNTER — Inpatient Hospital Stay: Payer: Self-pay

## 2022-12-10 ENCOUNTER — Encounter: Payer: Self-pay | Admitting: Oncology

## 2022-12-10 VITALS — BP 122/90 | HR 83 | Temp 97.2°F | Resp 18 | Wt 155.2 lb

## 2022-12-10 DIAGNOSIS — C642 Malignant neoplasm of left kidney, except renal pelvis: Secondary | ICD-10-CM

## 2022-12-10 DIAGNOSIS — I10 Essential (primary) hypertension: Secondary | ICD-10-CM

## 2022-12-10 DIAGNOSIS — N1832 Chronic kidney disease, stage 3b: Secondary | ICD-10-CM

## 2022-12-10 DIAGNOSIS — G969 Disorder of central nervous system, unspecified: Secondary | ICD-10-CM

## 2022-12-10 DIAGNOSIS — K625 Hemorrhage of anus and rectum: Secondary | ICD-10-CM

## 2022-12-10 LAB — CMP (CANCER CENTER ONLY)
ALT: 40 U/L (ref 0–44)
AST: 45 U/L — ABNORMAL HIGH (ref 15–41)
Albumin: 3.7 g/dL (ref 3.5–5.0)
Alkaline Phosphatase: 110 U/L (ref 38–126)
Anion gap: 8 (ref 5–15)
BUN: 45 mg/dL — ABNORMAL HIGH (ref 6–20)
CO2: 27 mmol/L (ref 22–32)
Calcium: 9 mg/dL (ref 8.9–10.3)
Chloride: 104 mmol/L (ref 98–111)
Creatinine: 1.85 mg/dL — ABNORMAL HIGH (ref 0.61–1.24)
GFR, Estimated: 41 mL/min — ABNORMAL LOW (ref >60)
Glucose, Bld: 114 mg/dL — ABNORMAL HIGH (ref 70–99)
Potassium: 4.6 mmol/L (ref 3.5–5.1)
Sodium: 139 mmol/L (ref 135–145)
Total Bilirubin: 0.9 mg/dL (ref <1.2)
Total Protein: 6.4 g/dL — ABNORMAL LOW (ref 6.5–8.1)

## 2022-12-10 LAB — CBC WITH DIFFERENTIAL (CANCER CENTER ONLY)
Abs Immature Granulocytes: 0.03 10*3/uL (ref 0.00–0.07)
Basophils Absolute: 0 10*3/uL (ref 0.0–0.1)
Basophils Relative: 0 %
Eosinophils Absolute: 0.2 10*3/uL (ref 0.0–0.5)
Eosinophils Relative: 3 %
HCT: 43.4 % (ref 39.0–52.0)
Hemoglobin: 14.5 g/dL (ref 13.0–17.0)
Immature Granulocytes: 0 %
Lymphocytes Relative: 28 %
Lymphs Abs: 1.9 10*3/uL (ref 0.7–4.0)
MCH: 30.9 pg (ref 26.0–34.0)
MCHC: 33.4 g/dL (ref 30.0–36.0)
MCV: 92.3 fL (ref 80.0–100.0)
Monocytes Absolute: 0.8 10*3/uL (ref 0.1–1.0)
Monocytes Relative: 12 %
Neutro Abs: 4 10*3/uL (ref 1.7–7.7)
Neutrophils Relative %: 57 %
Platelet Count: 134 10*3/uL — ABNORMAL LOW (ref 150–400)
RBC: 4.7 MIL/uL (ref 4.22–5.81)
RDW: 15.8 % — ABNORMAL HIGH (ref 11.5–15.5)
WBC Count: 7 10*3/uL (ref 4.0–10.5)
nRBC: 0 % (ref 0.0–0.2)

## 2022-12-10 MED ORDER — CABOMETYX 40 MG PO TABS
40.0000 mg | ORAL_TABLET | Freq: Every day | ORAL | 2 refills | Status: DC
Start: 2022-12-10 — End: 2023-02-12

## 2022-12-10 NOTE — Assessment & Plan Note (Addendum)
He has internal hemorrhoids.  Recommend patient to follow up with GI.  Hold Plavix. Symptom resolved.

## 2022-12-10 NOTE — Assessment & Plan Note (Addendum)
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  S/p 34 cycles of Keytruda [ about 2 years's treatment] - stopped in Sept 2023 --> 09/2022 Biopsy via bronchoscopy pathology showed disease recurrence, metastatic clear cell carcinoma Labs are reviewed and discussed with patient.  He did not tolerate Carbozantinib 60mg  daily and I recommend to decrease dose to 40mg  daily.

## 2022-12-10 NOTE — Assessment & Plan Note (Signed)
Not well controlled, due to Carbozantinib Losartan to 50mg  BID. Amlodipine 5mg  was added.

## 2022-12-10 NOTE — Progress Notes (Signed)
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  S/p 34 cycles of Keytruda [ about 2 years's treatment] - stopped in Sept 2023 --> 09/2022 Biopsy via bronchoscopy pathology showed disease recurrence, metastatic clear cell carcinoma Labs are reviewed and discussed with patient.  He did not tolerate Carbozantinib 60mg  daily and I recommend to decrease dose to 40mg  daily.   Rectal bleeding He has internal hemorrhoids.  Recommend patient to follow up with GI.  Hold Plavix. Symptom resolved.   Essential hypertension Not well controlled, due to Carbozantinib Losartan to 50mg  BID. Amlodipine 5mg  was added.    CNS lesion 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 Repeat MRI brain showed No evidence of active disease. No edema or abnormal enhancement   Stage 3b chronic kidney disease (HCC) avoid nephrotoxins. Encourage oral hydration   Orders Placed This Encounter  Procedures   CMP (Cancer Center only)    Standing Status:   Future    Standing Expiration Date:   12/10/2023   CBC with Differential (Cancer Center Only)    Standing Status:   Future    Standing Expiration Date:   12/10/2023    Patient is Spanish-speaking.   Spanish interpreter service utilized for entire encounter   Return visit  4-5 weeks after starting treatment.  All questions were answered. The patient knows to call the clinic with any problems, questions or concerns.  [name], MD, PhD [facility] Hematology Oncology 12/10/2022   PERTINENT  HISTORY-  62 y.o. male presents for follow-up of renal cell carcinoma  Oncology History  Renal cell carcinoma of left kidney (HCC)  05/23/2019 Initial Diagnosis   Renal cell carcinoma of left kidney   -05/02/2019-05/07/2019 due to gross hematuria. CT showed a 7.4 cm left lower pole renal mass concerning for RCC.  Patient also has necrotic lymphadenopathy in the mediastinum and the right supra hilar region which were highly suspicious for metastatic disease.  Bilateral lung nodules, also concerning for metastatic lung disease.  Right retrocrural lymphadenopathy with upper normal left periaortic lymph node. Patient was recommended for cytoreductive radical nephrectomy.  With history of ANCA positive vasculitis, also recommend thoracic lymphadenopathy biopsy to confirm distal metastasis.  Patient agreed to radical nephrectomy and declined bronchoscopy biopsy. He underwent left radical nephrectomy on 05/05/2019. Pathology showed pT3a pNx, RCC, conventional clear cell type, grade 3    05/23/2019 Cancer Staging   Staging form: Kidney, AJCC 8th Edition - Pathologic: Stage IV (pT3a, pNX, cM1) - Signed by [name], MD on 05/23/2019   06/07/2019 - 09/25/2021 Chemotherapy   Keytruda + Axitinib.  06/07/19 He started Axitinib 5mg  BID, stopped on 07/25/2020 due to AKI, resumed on 10/04/2019, stopped again on 05/01/2020 due to diarrhea. 09/11/2020 resumed axitinib 5 mg twice daily, stopped on 10/23/2020 due to diarrhea.  01/17/2021 Keytruda was held due to transaminitis, immunotherapy induced liver toxicities. 02/20/2021, resumed Keytruda.  09/25/2021 last dose of Keytruda    01/12/2020 Imaging   CT showed overall marked improvement.  Comparing to CT scan in August 2021, continues to have treatment  response.Mediastinal soft tissue nodule nearly completely resolved, right minor fissure nodule 8 mm, decreased in size.  Resolution of pleural fluid and thickening of the right chest.  Small pleural effusion in the left chest.   No new lung nodules.-Subtle variation of the muscle architecture of the left psoas muscle of unknown significance   04/30/2020 Imaging   CT chest abdomen pelvis without contrast showed stable disease   11/28/2020 Imaging   CT abdomen pelvis without contrast showed stable examination.  No evidence of new or progressive disease in the chest abdomen pelvis.  Stable to minimally decreased size of 5 mm pulmonary nodule in the minor fissure in the right chest.  Sigmoid colon diverticulosis without findings of acute diverticulitis.   03/08/2021 Imaging   CT scan showed NED   07/31/2021 Imaging   CT chest abdomen pelvis showed stable disease   08/27/2021 Imaging   MRI brain with and without contrast Decrease in size of lesions presumably reflecting neurocysticercosis. No evidence new metastatic disease   08/29/2021 Imaging   CT Chest abdomen pelvis w contrast 1. No findings of active malignancy. 6 by 4 mm right upper lobe nodule along the major fissure is stable and likely represents a previously treated metastatic lesion. 2. Coronary artery atherosclerosis disproportionate to atherosclerosis elsewhere in the chest, abdomen, and pelvis. 3. Mild surgical bronchiectasis in both lower lobes. 4. Mild prostatomegaly. 5. 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 Status post left nephrectomy, without recurrent or metastatic disease. 2. Similar small right-sided pulmonary nodules. 3. New or progressive posterior left upper lobe clustered nodularity and ground-glass, suspicious for minimal infection. 4.  Coronary artery atherosclerosi   09/25/2022 Imaging   CT chest abdomen pelvis w contrast showed CHEST:   1. New nodularity in the LEFT upper lobe with new mediastinal lymphadenopathy. 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 2. No skeletal metastasis.   PELVIS:   1. No evidence of local  recurrence in the LEFT nephrectomy bed. 2. No evidence of metastatic disease in the abdomen pelvis.    10/08/2022 Imaging   PET scan showed  1. Hypermetabolic mediastinal and left hilar lymphadenopathy with hypermetabolic nodularity in the posterior left upper lobe. Imaging features are compatible with metastatic disease. 2. No evidence for metastatic disease in the abdomen or pelvis. 3. Changes of avascular necrosis in the femoral heads bilaterally.    10/10/2022 Relapse/Recurrence   S/p biopsy via bronchoscopy  1. Lung, biopsy, Left upper lobe :       - METASTATIC CLEAR CELL RENAL CELL CARCINOMA   Bronchial Brushing Lung, upper lobe  Fine Needle Aspiration  - POSITIVE FOR MALIGNANCY.       - CONSISTENT WITH METASTATIC CLEAR CELL RENAL CELL CARCINOMA.     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  Patient has been referred to orthopedic surgeon in [place] which he is not able to go due to transportation.  He does not have insurance as well.  He is currently asymptomatic.  Monitor.  Patient has past medical history CAD status post PCI to LAD in 2018, on aspirin and Plavix, CHF, history of focal sclerosing/crescentic GN-ANCA positive and CKD stage III. 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 He finished 14 days of albendazole plus praziquantel .   He has had few side effects including poor appetite, hair loss, weight loss, bloody stool, abdominal cramping  or diarrhea. All symptoms have improved since he completed the treatment. 03/16/2020 he finished tapering course of steroids  #Intermittent orthopnea,Pre-existing diastolic CHF, 05/04/2019 LVEF 60 to 65%.   Recommend him to follow-up with cardiology-he is has not able to follow-up with cardiology due to lack of insurance coverage.  # He has frequent urination at night.  Previously tried Cardura for BPH and had to stop due hypotension. 07/2022 S/p EGD and colonoscopy -  diverticulosis, non bleeding internal hemorrhoids.  Gastritis.  INTERVAL HISTORY Mark Donaldson is a 62 y.o. male who has above history reviewed by me today presents for follow up visit for metastatic RCC Patient has appointment later this week and he presented to the office and requesting appointments to be moved up to today due to concern of rectal bleeding. Patient reports occasional cough, no shortness of breath.  Reports that he was started on mirtazapine his appetite has increased. Blood pressure medications were adjusted by PCP. He does not have anymore rectal bleeding.     Review of systems- Review of Systems  Constitutional:  Negative for appetite change, chills, fatigue, fever and unexpected weight change.  HENT:   Negative for voice change.   Eyes:  Negative for eye problems and icterus.  Respiratory:  Positive for cough. Negative for chest tightness.   Cardiovascular:  Negative for chest pain and leg swelling.  Gastrointestinal:  Negative for abdominal distention, abdominal pain, blood in stool, diarrhea and rectal pain.  Endocrine: Negative for hot flashes.  Genitourinary:  Negative for difficulty urinating, dysuria, frequency and nocturia.   Musculoskeletal:  Positive for arthralgias and back pain.  Skin:  Negative for itching and rash.  Neurological:  Negative for light-headedness and numbness.  Hematological:  Negative for adenopathy. Does not bruise/bleed easily.  Psychiatric/Behavioral:  Negative for confusion.     No Known Allergies  Patient Active Problem List   Diagnosis Date Noted   Renal cell carcinoma of left kidney (HCC) 05/23/2019    Priority: High   Rectal bleeding 11/24/2022    Priority: Medium    Stage 3b chronic kidney disease (HCC) 10/04/2019    Priority: Medium    CNS lesion 07/07/2019    Priority: Medium    Essential hypertension 05/18/2016    Priority: Medium    IDA (iron deficiency anemia) 11/21/2021    Priority: Low   Encounter  for antineoplastic immunotherapy 07/19/2019    Priority: Low   Encounter for antineoplastic chemotherapy 07/19/2019    Priority: Low   Goals of care, counseling/discussion 05/23/2019    Priority: Low   Need for prophylactic vaccination and inoculation against influenza 11/13/2022   Gastritis without bleeding 07/30/2022   Cough 06/03/2022   Avascular necrosis of bones of both hips (HCC) 06/20/2021   Transaminitis 01/17/2021   History of CHF (congestive heart failure) 05/01/2020   Hyperglycemia 02/28/2020   Brain lesion 10/04/2019   Anemia in stage 3b chronic kidney disease (HCC) 06/07/2019   Renal cell carcinoma (HCC) 05/23/2019   Palliative care encounter    History of vasculitis    Thoracic lymphadenopathy    Lung nodules    CAD S/P percutaneous coronary angioplasty 05/02/2019   Kidney mass 05/02/2019   Hydronephrosis of left kidney 05/02/2019   UTI (urinary tract infection) 05/02/2019   AKI (acute kidney injury) (HCC) 05/02/2019   Gross hematuria 05/02/2019   Chronic diastolic CHF (congestive heart failure) (HCC) 05/02/2019   Sepsis (HCC) 05/02/2019   Severe sepsis (HCC) 05/02/2019   Preop cardiovascular exam  Hyperlipidemia 05/21/2016   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) 05/21/2016   Prediabetes 05/21/2016   Alcohol abuse 05/18/2016   Vasculitis, 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HCC) 05/18/2016   Shortness of breath 05/17/2016     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HCC)    Avascular necrosis of bones of both hips (HCC)    CAD (coronary artery disease)    Essential hypertension    Gastritis without bleeding    HFrEF (heart failure with reduced ejection fraction) (HCC)    Hyperlipidemia LDL goal <70    Iron deficiency anemia    Ischemic cardiomyopathy    Lung nodules 09/2022   left upper lobe   Mediastinal adenopathy 09/2022   Renal cell carcinoma (HCC) 05/23/2019   Renal disorder    Severe sepsis (HCC)    Stage 3b chronic  kidney disease (CKD) (HCC)      Past Surgical History:  Procedure Laterality Date   BIOPSY  07/30/2022   Procedure: BIOPSY;  Surgeon: Midge Minium, MD;  Location: [facility];  Service: Endoscopy;;   COLONOSCOPY WITH PROPOFOL N/A 07/30/2022   Procedure: COLONOSCOPY WITH PROPOFOL;  Surgeon: Midge Minium, MD;  Location: [facility];  Service: Endoscopy;  Laterality: N/A;  SPANISH INTERPRETER   ENDOBRONCHIAL ULTRASOUND Left 10/10/2022   Procedure: ENDOBRONCHIAL ULTRASOUND;  Surgeon: Salena Saner, MD;  Location: [facility] OR[facility];  Service: Pulmonary;  Laterality: Left;   ESOPHAGOGASTRODUODENOSCOPY (EGD) WITH PROPOFOL N/A 07/30/2022   Procedure: ESOPHAGOGASTRODUODENOSCOPY (EGD) WITH PROPOFOL;  Surgeon: Midge Minium, MD;  Location: [facility];  Service: Endoscopy;  Laterality: N/A;   LAPAROSCOPIC NEPHRECTOMY, HAND ASSISTED Left 05/05/2019   Procedure: HAND ASSISTED LAPAROSCOPIC NEPHRECTOMY;  Surgeon: Vanna Scotland, MD;  Location: [facility] OR[facility];  Service: Urology;  Laterality: Left;   LEFT HEART CATH AND CORONARY ANGIOGRAPHY  05/20/2016   LAD lesion s/p PCI with Xience    Social History   Socioeconomic History   Marital status: Married    Spouse name: Edan   Number of children: 4   Years of education: Not on file   Highest education level: Not on file  Occupational History   Not on file  Tobacco Use   Smoking status: Never   Smokeless tobacco: Never  Vaping Use   Vaping status: Never Used  Substance and Sexual Activity   Alcohol use: Not Currently   Drug use: Never   Sexual activity: Not on file  Other Topics Concern   Not on file  Social History Narrative   Not on file   Social Determinants of Health   Financial Resource Strain: Not on file  Food Insecurity: No Food Insecurity (07/14/2022)   [facility]   Hunger Vital Sign    Worried About Running Out of Food in the Last Year: Never true    Ran Out of Food in the Last Year:  Never true  Transportation Needs: No Transportation Needs (07/14/2022)   [facility]   [facility] - Transportation    Lack of Transportation (Medical): No    Lack of Transportation (Non-Medical): No  Physical Activity: Not on file  Stress: Not on file  Social Connections: Not on file  Intimate Partner Violence: Not on file     Family History  Problem Relation Age of Onset   Cancer Mother    Blindness Brother      Current Outpatient Medications:    albuterol (VENTOLIN HFA) 108 (90 Base) MCG/ACT inhaler, Inhale 2 puffs  into the lungs every 6 (six) hours as needed for wheezing or shortness of breath., Disp: 6.7 g, Rfl: 2   amLODipine (NORVASC) 5 MG tablet, Take 5 mg by mouth daily., Disp: , Rfl:    aspirin 81 MG EC tablet, Take 81 mg by mouth daily. Swallow whole., Disp: , Rfl:    atorvastatin (LIPITOR) 80 MG tablet, Take 80 mg by mouth at bedtime., Disp: , Rfl:    clopidogrel (PLAVIX) 75 MG tablet, Take 75 mg by mouth daily., Disp: , Rfl:    ferrous sulfate 325 (65 FE) MG tablet, Take 1 tablet (325 mg total) by mouth 2 (two) times daily with a meal., Disp: 180 tablet, Rfl: 2   Fluticasone-Umeclidin-Vilant (TRELEGY ELLIPTA) 100-62.5-25 MCG/ACT AEPB, Inhale 1 puff into the lungs daily., Disp: 60 each, Rfl: 11   furosemide (LASIX) 20 MG tablet, Take 1 tablet (20 mg total) by mouth daily., Disp: 30 tablet, Rfl: 3   losartan (COZAAR) 50 MG tablet, Take 50 mg by mouth 2 (two) times daily., Disp: , Rfl:    metoprolol tartrate (LOPRESSOR) 100 MG tablet, Take 150 mg by mouth daily., Disp: , Rfl:    mirtazapine (REMERON) 15 MG tablet, Take 15 mg by mouth at bedtime., Disp: , Rfl:    cabozantinib (CABOMETYX) 40 MG tablet, Take 1 tablet (40 mg total) by mouth daily. Take on an empty stomach, 1 hour before or 2 hours after meals., Disp: 30 tablet, Rfl: 2   Physical exam:  Vitals:   12/10/22 1446  BP: (!) 122/90  Pulse: 83  Resp: 18  Temp: (!) 97.2 F (36.2  C)  Weight: 155 lb 3.2 oz (70.4 kg)    Physical Exam Constitutional:      General: He is not in acute distress.    Appearance: He is not diaphoretic.  HENT:     Head: Normocephalic and atraumatic.  Eyes:     General: No scleral icterus. Cardiovascular:     Rate and Rhythm: Normal rate and regular rhythm.  Pulmonary:     Effort: Pulmonary effort is normal. No respiratory distress.  Abdominal:     General: There is no distension.     Palpations: Abdomen is soft.     Tenderness: There is no abdominal tenderness.  Musculoskeletal:        General: Normal range of motion.     Cervical back: Normal range of motion.  Skin:    General: Skin is warm and dry.     Findings: No erythema.  Neurological:     Mental Status: He is alert and oriented to person, place, and time. Mental status is at baseline.     Cranial Nerves: No cranial nerve deficit.     Motor: No abnormal muscle tone.  Psychiatric:        Mood and Affect: Mood and affect normal.       Labs    Latest Ref Rng & Units 12/10/2022    2:28 PM 11/13/2022   10:04 AM 10/02/2022    1:20 PM  CBC  WBC 4.0 - 10.5 K/uL 7.0  8.2  8.9   Hemoglobin 13.0 - 17.0 g/dL 11.9  14.7  82.9   Hematocrit 39.0 - 52.0 % 43.4  51.2  47.0   Platelets 150 - 400 K/uL 134  115  162       Latest Ref Rng & Units 12/10/2022    2:28 PM 11/13/2022   10:04 AM 10/02/2022    1:20 PM  CMP  Glucose 70 - 99 mg/dL 161  096  95   BUN 6 - 20 mg/dL 45  26  36   Creatinine 0.61 - 1.24 mg/dL 0.45  4.09  8.11   Sodium 135 - 145 mmol/L 139  134  138   Potassium 3.5 - 5.1 mmol/L 4.6  4.2  4.4   Chloride 98 - 111 mmol/L 104  102  105   CO2 22 - 32 mmol/L 27  24  23    Calcium 8.9 - 10.3 mg/dL 9.0  9.2  9.4   Total Protein 6.5 - 8.1 g/dL 6.4  6.7  6.9   Total Bilirubin <1.2 mg/dL 0.9  0.9  0.9   Alkaline Phos 38 - 126 U/L 110  168  87   AST 15 - 41 U/L 45  78  29   ALT 0 - 44 U/L 40  62  27       RADIOGRAPHIC STUDIES: I have personally reviewed the  radiological images as listed and agreed with the findings in the report. MR Brain W Wo Contrast  Result Date: 12/10/2022 CLINICAL DATA:  Renal cell carcinoma.  Staging EXAM: MRI HEAD WITHOUT AND WITH CONTRAST TECHNIQUE: Multiplanar, multiecho pulse sequences of the brain and surrounding structures were obtained without and with intravenous contrast. CONTRAST:  7mL GADAVIST GADOBUTROL 1 MMOL/ML IV SOLN COMPARISON:  08/26/2021 FINDINGS: Brain: Areas of ring enhancement in the brain have resolved. There is history of both neurocysticercosis and metastatic disease. No abnormal intracranial enhancement. Unchanged areas of nodular gradient hypointensity scattered in the right cerebellum and bilateral superficial cerebrum, likely mineralization. No new enhancement to suggesting metastatic disease. No infarct, hemorrhage, hydrocephalus, mass effect, or brain edema. Small FLAIR hyperintensities scattered in the cerebral white matter with nonspecific pattern. Vascular: Normal flow voids and vascular enhancements. Skull and upper cervical spine: Normal marrow signal. Sinuses/Orbits: Negative IMPRESSION: No evidence of active disease.  No edema or abnormal enhancement. Electronically Signed   By: Tiburcio Pea M.D.   On: 12/10/2022 07:37   DG Chest Port 1 View  Result Date: 10/10/2022 CLINICAL DATA:  Status post bronchoscopy and biopsy. Concern for pneumothorax. Renal cell carcinoma. EXAM: PORTABLE CHEST 1 VIEW COMPARISON:  Chest radiograph dated 08/04/2019 and CT dated 10/08/2022. FINDINGS: Left suprahilar density corresponding to the nodularity seen on the prior CT and PET. No new consolidation. There is no pleural effusion or pneumothorax. The cardiac silhouette is within normal limits. No acute osseous pathology. IMPRESSION: 1. No pneumothorax. 2. Left suprahilar nodularity. Electronically Signed   By: Elgie Collard M.D.   On: 10/10/2022 16:20   DG C-Arm 1-60 Min-No Report  Result Date:  10/10/2022 Fluoroscopy was utilized by the requesting physician.  No radiographic interpretation.   CT SUPER D CHEST WO MONARCH PILOT  Result Date: 10/08/2022 CLINICAL DATA:  Lung nodules. 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 EXAM: CT CHEST WITHOUT CONTRAST TECHNIQUE: Multidetector CT imaging of the chest was performed using thin slice collimation for electromagnetic bronchoscopy planning purposes, without intravenous contrast. RADIATION DOSE REDUCTION: This exam was performed according to the departmental dose-optimization program which includes automated exposure control, adjustment of the mA and/or kV according to patient size and/or use of iterative reconstruction technique. COMPARISON:  09/25/2022, 03/19/2022, 11/18/2021, 08/28/2021 and 11/28/2020. FINDINGS: Cardiovascular: Atherosclerotic calcification of the aortic valve with age advanced involvement of the coronary arteries. Heart size normal. No pericardial effusion. Mediastinum/Nodes: Mediastinal lymph nodes measure up to 2.7 cm in the AP window, as on  09/25/2022 but new from 03/19/2022. Hilar regions are difficult to evaluate without IV contrast. No axillary adenopathy. Esophagus is grossly unremarkable. Lungs/Pleura: Nodular consolidation and ground-glass in the posterior left upper lobe, unchanged from 09/25/2022 but progressive from 03/19/2022. No pleural fluid. There may be slight narrowing of left upper lobe bronchi. Airway is otherwise unremarkable. Upper Abdomen: Visualized portions of the liver, gallbladder, adrenal glands, right kidney, spleen, pancreas, stomach and bowel are grossly unremarkable. Left nephrectomy. No upper abdominal adenopathy. Musculoskeletal: None. IMPRESSION: 1. Mediastinal adenopathy with increasing nodular consolidation and ground-glass in the posterior left upper lobe, findings worrisome for metastatic disease. 2. Today's PET dictated separately. 3. Age advanced coronary artery  calcification. Electronically Signed   By: Leanna Battles M.D.   On: 10/08/2022 14:37   NM PET Image Restag (PS) Skull Base To Thigh  Result Date: 10/08/2022 CLINICAL DATA:  Subsequent treatment strategy for renal cell carcinoma. EXAM: NUCLEAR MEDICINE PET SKULL BASE TO THIGH TECHNIQUE: 8 mCi F-18 FDG was injected intravenously. Full-ring PET imaging was performed from the skull base to thigh after the radiotracer. CT data was obtained and used for attenuation correction and anatomic localization. Fasting blood glucose: 81 mg/dl COMPARISON:  Chest abdomen pelvis CT 09/25/2022.  PET-CT 09/08/2019 FINDINGS: Mediastinal blood pool activity: SUV max 2.3 Liver activity: SUV max NA NECK: No hypermetabolic lymph nodes in the neck. Incidental CT findings: None. CHEST: Mediastinal left hilar lymphadenopathy identified on the recent chest CT is hypermetabolic. Pre-vascular and AP window lymphadenopathy demonstrates SUV max = 9.5. Left hilar hypermetabolism noted with SUV max = 10.6. The nodularity in the posterior left upper lobe is hypermetabolic with SUV max = 7.3. Incidental CT findings: Coronary artery calcification is evident. Dependent atelectasis noted in the lower lungs bilaterally. ABDOMEN/PELVIS: No abnormal hypermetabolic activity within the liver, pancreas, adrenal glands, or spleen. No hypermetabolic lymph nodes in the abdomen or pelvis. Incidental CT findings: Left nephrectomy. Diverticular disease noted left colon without diverticulitis. SKELETON: No focal hypermetabolic activity to suggest skeletal metastasis. Incidental CT findings: No worrisome lytic or sclerotic osseous abnormality. Changes of avascular necrosis noted in the femoral heads bilaterally. IMPRESSION: 1. Hypermetabolic mediastinal and left hilar lymphadenopathy with hypermetabolic nodularity in the posterior left upper lobe. Imaging features are compatible with metastatic disease. 2. No evidence for metastatic disease in the abdomen or  pelvis. 3. Changes of avascular necrosis in the femoral heads bilaterally. Electronically Signed   By: Kennith Center M.D.   On: 10/08/2022 12:55   CT CHEST ABDOMEN PELVIS WO CONTRAST  Result Date: 10/01/2022 CLINICAL DATA:  Renal cell carcinoma LEFT kidney. Status post 34 cycles immunotherapy. Assess treatment response. * Tracking Code: BO * EXAM: CT CHEST, ABDOMEN AND PELVIS WITHOUT CONTRAST TECHNIQUE: Multidetector CT imaging of the chest, abdomen and pelvis was performed following the standard protocol without IV contrast. RADIATION DOSE REDUCTION: This exam was performed according to the departmental dose-optimization program which includes automated exposure control, adjustment of the mA and/or kV according to patient size and/or use of iterative reconstruction technique. COMPARISON:  05/19/2022 FINDINGS: CT CHEST FINDINGS Cardiovascular: No significant vascular findings. Normal heart size. No pericardial effusion. Mediastinum/Nodes: Multiple newly enlarged mediastinal lymph nodes. For example prevascular lymph node measuring 20 mm (image 26/2) centrally new from prior. RIGHT lower paratracheal node measuring 20 mm increased from 5 mm. Lungs/Pleura: New nodularity in the LEFT upper lobe at site mild interstitial thickening. Nodule along the oblique fissure in the LEFT upper lobe measures 11 mm (image 52/4 nodule in the LEFT  suprahilar lung adjacent to the pulmonary vasculature measures 10 mm (image 59/4. No RIGHT lung nodules. Musculoskeletal: No aggressive osseous lesion. CT ABDOMEN AND PELVIS FINDINGS Hepatobiliary: No focal hepatic lesion. No biliary ductal dilatation. Gallbladder is normal. Common bile duct is normal. Pancreas: Pancreas is normal. No ductal dilatation. No pancreatic inflammation. Spleen: Normal spleen Adrenals/urinary tract: Adrenal glands normal. Post LEFT nephrectomy. No nodularity in the nephrectomy bed. RIGHT kidney normal.  RIGHT ureter and bladder normal. Stomach/Bowel: Stomach,  small bowel, appendix, and cecum are normal. The colon and rectosigmoid colon are normal. Vascular/Lymphatic: Abdominal aorta is normal caliber. There is no retroperitoneal or periportal lymphadenopathy. No pelvic lymphadenopathy. Reproductive: Prostate normal Other: No free fluid. Musculoskeletal: No aggressive osseous lesion. IMPRESSION: CHEST: 1. New nodularity in the LEFT upper lobe with new mediastinal lymphadenopathy. 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 2. No skeletal metastasis. PELVIS: 1. No evidence of local recurrence in the LEFT nephrectomy bed. 2. No evidence of metastatic disease in the abdomen pelvis. Electronically Signed   By: Genevive Bi M.D.   On: 10/01/2022 11:30

## 2022-12-10 NOTE — Assessment & Plan Note (Signed)
avoid nephrotoxins. Encourage oral hydration

## 2022-12-10 NOTE — Progress Notes (Signed)
Pt here for follow up. Reports that he was started on mirtazapine his appetite has increased. Blood pressure medications were adjusted by PCP. He does not have anymore rectal bleeding so he held off scheduling with GI.

## 2022-12-10 NOTE — Assessment & Plan Note (Signed)
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 Repeat MRI brain showed No evidence of active disease. No edema or abnormal enhancement

## 2022-12-10 NOTE — Progress Notes (Signed)
MD dose decreasing patient to 40mg  daily Cabozantinib. New rx sent to assistance program pharmacy

## 2022-12-24 ENCOUNTER — Telehealth: Payer: Self-pay

## 2022-12-24 NOTE — Telephone Encounter (Signed)
Received notification from MD that patient was having difficulty ordering refill of Cabometyx 40mg  tablets. I called [facility] and confirmed they had received script and was able to conference in patient to schedule refill and delivery of medication.    Ardeen Fillers, CPhT Oncology Pharmacy Patient Advocate  [facility]  [phone] (phone) [phone] (fax) 12/24/2022 2:48 PM

## 2022-12-26 ENCOUNTER — Encounter: Payer: Self-pay | Admitting: Oncology

## 2022-12-26 ENCOUNTER — Other Ambulatory Visit: Payer: Self-pay

## 2022-12-26 ENCOUNTER — Encounter: Payer: Self-pay | Admitting: Pulmonary Disease

## 2022-12-26 ENCOUNTER — Ambulatory Visit: Payer: Self-pay | Admitting: Pulmonary Disease

## 2022-12-26 VITALS — BP 122/84 | HR 72 | Temp 97.1°F | Ht 66.0 in | Wt 158.4 lb

## 2022-12-26 DIAGNOSIS — J4540 Moderate persistent asthma, uncomplicated: Secondary | ICD-10-CM

## 2022-12-26 DIAGNOSIS — C642 Malignant neoplasm of left kidney, except renal pelvis: Secondary | ICD-10-CM

## 2022-12-26 DIAGNOSIS — I7782 Antineutrophilic cytoplasmic antibody (ANCA) vasculitis: Secondary | ICD-10-CM

## 2022-12-26 NOTE — Progress Notes (Signed)
Subjective:    Patient ID: Mark Donaldson, male    DOB: 1960-08-31, 62 y.o.   MRN: [id number]  Patient Care Team: Center, [facility] as PCP - General (General Practice) [name], MD as Consulting Physician (Oncology)  Chief Complaint  Patient presents with   Follow-up    Bronchoscopy on 10/10/2022    BACKGROUND/INTERVAL: 62 year old lifelong never smoker with very complex medical history as noted below who presents for follow-up.  He was last seen on 02 October 2022.  Sequently had robotic assisted bronchoscopy, and endobronchial ultrasound on 10 October 2022.  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  He is followed by her oncology (Dr. Cathie Hoops) and is currently on Carbozantinib 40 mg daily.  With regards to his respiratory issues these are mostly related to airways reactivity.  *History was obtained directly from the patient communicating in his native language (Spanish) in which I am proficient.   HPI Discussed the use of AI scribe software for clinical note transcription with the patient, who gave verbal consent to proceed.  History of Present Illness   The patient, with a history of kidney cancer and respiratory issues, reports feeling well overall. He has been using an inhaler, which has been effective in managing his respiratory symptoms. He denies any current cough or difficulty breathing.  He recently started a new medication for his kidney cancer, Cabozantinib. Initially, he was prescribed a 60mg  dose, but he was unable to tolerate it. After discussing with his oncologist, the dose was reduced to 40mg . He has just received this new medication and has yet to start it. He reports feeling well and has been eating well in anticipation of starting the new medication.  He has been receiving his medications from the [facility] pharmacy. He reports that he is on his last inhaler and will need a refill soon. He also  has an emergency inhaler, which he has not had to use.  Reviewing his medication dispensing record he still has refills of Trelegy and albuterol.  He was made aware of this that all he has to do is call the pharmacy for refill.  Recall he had robotic assisted navigational bronchoscopy and endobronchial ultrasound on 10 October 2022, overall he tolerated the procedure well.  Post-procedure, he experienced some discomfort in his throat for a few days, which he attributes to the tube used during the procedure. The discomfort has since resolved.  The discomfort was never severe.    Does not endorse any other symptoms/concerns today.  Overall he feels well and looks well.  Review of Systems A 10 point review of systems was performed and it is as noted above otherwise negative.   Patient Active Problem List   Diagnosis Date Noted   Rectal bleeding 11/24/2022   Need for prophylactic vaccination and inoculation against influenza 11/13/2022   Gastritis without bleeding 07/30/2022   Cough 06/03/2022   IDA (iron deficiency anemia) 11/21/2021   Avascular necrosis of bones of both hips (HCC) 06/20/2021   Transaminitis 01/17/2021   History of CHF (congestive heart failure) 05/01/2020   Hyperglycemia 02/28/2020   Stage 3b chronic kidney disease (HCC) 10/04/2019   Brain lesion 10/04/2019   Encounter for antineoplastic immunotherapy 07/19/2019   Encounter for antineoplastic chemotherapy 07/19/2019   CNS lesion 07/07/2019   Anemia in stage 3b chronic kidney disease (HCC) 06/07/2019   Goals of care, counseling/discussion 05/23/2019   Renal cell carcinoma of left kidney (HCC) 05/23/2019  Renal cell carcinoma (HCC) 05/23/2019   Palliative care encounter    History of vasculitis    Thoracic lymphadenopathy    Lung nodules    CAD S/P percutaneous coronary angioplasty 05/02/2019   Kidney mass 05/02/2019   Hydronephrosis of left kidney 05/02/2019   UTI (urinary tract infection) 05/02/2019   AKI (acute  kidney injury) (HCC) 05/02/2019   Gross hematuria 05/02/2019   Chronic diastolic CHF (congestive heart failure) (HCC) 05/02/2019   Sepsis (HCC) 05/02/2019   Severe sepsis (HCC) 05/02/2019   Preop cardiovascular exam    Hyperlipidemia 05/21/2016   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   amLODipine (NORVASC) 5 MG tablet Take 5 mg by mouth daily.   aspirin 81 MG EC tablet Take 81 mg by mouth daily. Swallow whole.   atorvastatin (LIPITOR) 80 MG tablet Take 80 mg by mouth at bedtime.   cabozantinib (CABOMETYX) 40 MG tablet Take 1 tablet (40 mg total) by mouth daily. Take on an empty stomach, 1 hour before or 2 hours after meals.   clopidogrel (PLAVIX) 75 MG tablet Take 75 mg by mouth daily.   ferrous sulfate 325 (65 FE) MG tablet Take 1 tablet (325 mg total) by mouth 2 (two) times daily with a meal.   Fluticasone-Umeclidin-Vilant (TRELEGY ELLIPTA) 100-62.5-25 MCG/ACT AEPB Inhale 1 puff into the lungs daily.   furosemide (LASIX) 20 MG tablet Take 1 tablet (20 mg total) by mouth daily.   losartan (COZAAR) 50 MG tablet Take 50 mg by mouth 2 (two) times daily.   metoprolol tartrate (LOPRESSOR) 100 MG tablet Take 150 mg by mouth daily.   mirtazapine (REMERON) 15 MG tablet Take 15 mg by mouth at bedtime.    Immunization History  Administered Date(s) Administered   Influenza, Seasonal, Injecte, Preservative Fre 11/13/2022   Influenza,inj,Quad PF,6+ Mos  11/02/2019   Pneumococcal Conjugate-13 01/14/2007        Objective:     BP 122/84 (BP Location: Right Arm, Cuff Size: Normal)   Pulse 72   Temp (!) 97.1 F (36.2 C)   Ht 5\' 6"  (1.676 m)   Wt 158 lb 6.4 oz (71.8 kg)   SpO2 100%   BMI 25.57 kg/m   SpO2: 100 % O2 Device: None (Room air)  GENERAL: Well-developed, well-nourished gentleman, no acute distress.  Fully ambulatory.  No conversational dyspnea. HEAD: Normocephalic, atraumatic.  EYES: Pupils equal, round, reactive to light.  No scleral icterus.  MOUTH: Dentition intact, oral mucosa moist.  No thrush. NECK: Supple. No thyromegaly. Trachea midline. No JVD.  No adenopathy. PULMONARY: Good air entry bilaterally.  No adventitious sounds noted today. CARDIOVASCULAR: S1 and S2. Regular rate and rhythm.  No rubs, murmurs or gallops heard.   ABDOMEN: Benign. MUSCULOSKELETAL: No joint deformity, no clubbing, no edema.  NEUROLOGIC: No overt focal deficit, no gait disturbance, speech is fluent. SKIN: Intact,warm,dry. PSYCH: Mood and behavior normal.  Assessment & Plan:  ICD-10-CM   1. Moderate persistent asthma without complication  J45.40     2. Vasculitis, 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HCC)  I77.82     3. Renal cell carcinoma of left kidney metastatic to other site [facility])  C64.2      Discussion:    Asthma Asthma is well-controlled with current inhaler use. No issues with breathing or coughing. Inhaler used once daily with sufficient medication for a few more days. Emphasized the importance of continuing the regimen and monitoring symptoms. - Continue Trelegy Ellipta 100, he has refills still available - Continue as needed albuterol - Follow up in four months  Post-procedural throat discomfort Throat discomfort for 2-3 days post-intubation has resolved. No further action required unless symptoms recur. - No further action required unless symptoms recur  Metastatic Renal Cell Carcinoma to Lung Under Dr. Bethanne Ginger care.  On  Cabozantinib, had difficulty tolerating 60 mg dose, recently received 40 mg dose but not yet started. Currently feeling well. Discussed monitoring for adverse effects and reporting issues to Dr. Cathie Hoops. - Monitor tolerance to the new 40 mg dose - Follow up with Dr. Cathie Hoops as needed  Follow-up - Schedule follow-up appointment in four months - Contact the clinic if any issues arise before the next appointment.    Advised if symptoms do not improve or worsen, to please contact office for sooner follow up or seek emergency care.    I spent 30 minutes of dedicated to the care of this patient on the date of this encounter to include pre-visit review of records, face-to-face time with the patient discussing conditions above, post visit ordering of testing, clinical documentation with the electronic health record, making appropriate referrals as documented, and communicating necessary findings to members of the patients care team.    C. Danice Goltz, MD Advanced Bronchoscopy PCCM  [facility]    *This note was generated using voice recognition software/Dragon and/or AI transcription program.  Despite best efforts to proofread, errors can occur which can change the meaning. Any transcriptional errors that result from this process are unintentional and may not be fully corrected at the time of dictation.

## 2022-12-26 NOTE — Patient Instructions (Addendum)
VISIT SUMMARY:  During today's visit, we discussed your overall well-being and reviewed your current treatments for asthma and kidney cancer. You reported feeling well and have been managing your respiratory symptoms effectively with your inhaler. We also addressed your recent medication adjustment for kidney cancer and your post-procedural throat discomfort, which has resolved.  YOUR PLAN:  -ASTHMA: Asthma is a condition where your airways narrow and swell, making it difficult to breathe. Your asthma is well-controlled with your current inhaler, which you use once daily. We will provide you with inhaler samples and check your prescription status to ensure you have enough medication. Please continue your current regimen and monitor your symptoms. We will follow up in four months.  -POST-PROCEDURAL THROAT DISCOMFORT: You experienced throat discomfort for a few days after your recent procedure, likely due to the tube used during intubation. This discomfort has resolved, and no further action is required unless the symptoms recur.  -RENAL CELL CARCINOMA: Renal cell carcinoma is a type of kidney cancer. You had difficulty tolerating the initial 60 mg dose of your new medication, so it has been reduced to 40 mg. You have not yet started this new dose. Please monitor how you feel once you start the new dose and report any issues to Dr. Cathie Hoops. Continue to follow up with Dr. Cathie Hoops as needed.  INSTRUCTIONS:  Please schedule a follow-up appointment in four months. If any issues arise before your next appointment, contact the clinic.  Durante la visita de hoy, analizamos su bienestar general y revisamos sus tratamientos actuales para el asma y el cncer de rin. Usted inform sentirse bien y ha estado controlando sus sntomas respiratorios de Wellsite geologist efectiva con su inhalador. Tambin abordamos su reciente ajuste de medicacin para el cncer de rin y Civil Service fast streamer de garganta posterior al procedimiento, que se  resolvi.  TU PLAN:  -ASTMA: El asma es una afeccin en la que las vas respiratorias se estrechan e hinchan, lo que dificulta la respiracin. Su asma est bien controlada con Education administrator actual, que [place] una vez al [employer]. Le proporcionaremos muestras de inhaladores y verificaremos el estado de su receta para asegurarnos de que tenga suficiente medicamento. Contine con su rgimen actual y controle sus sntomas. Haremos un seguimiento en cuatro meses.  -MOLESTIAS EN LA GARGANTA POST-PROCEDIMIENTO: Experiment molestias en la garganta durante algunos das despus de su procedimiento reciente, probablemente debido al tubo utilizado durante la intubacin. Este Dentist se ha resuelto y no es Passenger transport manager ninguna otra accin a menos que los sntomas reaparezcan.  -CARCINOMA DE CLULAS RENALES: El carcinoma de clulas renales es un tipo de cncer de rin. Tuvo dificultades para tolerar la dosis inicial de 60 mg de su nuevo medicamento, por lo que se la redujeron a 40 mg. An no ha comenzado esta nueva dosis. Controle cmo se siente una vez que comience la nueva dosis e informe cualquier problema al Dr. Cathie Hoops. Contine el seguimiento con el Dr. Cathie Hoops segn sea necesario.  INSTRUCCIONES:  Programe una cita de seguimiento en cuatro meses. Si surge algn problema antes de su prxima cita, comunquese con la clnica.

## 2023-01-11 ENCOUNTER — Encounter: Payer: Self-pay | Admitting: Oncology

## 2023-01-11 ENCOUNTER — Other Ambulatory Visit: Payer: Self-pay

## 2023-01-12 ENCOUNTER — Other Ambulatory Visit: Payer: Self-pay

## 2023-01-15 ENCOUNTER — Inpatient Hospital Stay: Payer: Self-pay | Attending: Oncology

## 2023-01-15 ENCOUNTER — Inpatient Hospital Stay: Payer: Self-pay | Admitting: Oncology

## 2023-01-15 ENCOUNTER — Encounter: Payer: Self-pay | Admitting: Oncology

## 2023-01-15 VITALS — BP 138/100 | HR 71 | Temp 98.5°F | Resp 18 | Wt 158.7 lb

## 2023-01-15 DIAGNOSIS — N1832 Chronic kidney disease, stage 3b: Secondary | ICD-10-CM | POA: Insufficient documentation

## 2023-01-15 DIAGNOSIS — Z79899 Other long term (current) drug therapy: Secondary | ICD-10-CM | POA: Insufficient documentation

## 2023-01-15 DIAGNOSIS — C642 Malignant neoplasm of left kidney, except renal pelvis: Secondary | ICD-10-CM

## 2023-01-15 DIAGNOSIS — Z85528 Personal history of other malignant neoplasm of kidney: Secondary | ICD-10-CM | POA: Insufficient documentation

## 2023-01-15 DIAGNOSIS — D631 Anemia in chronic kidney disease: Secondary | ICD-10-CM | POA: Insufficient documentation

## 2023-01-15 DIAGNOSIS — Z7982 Long term (current) use of aspirin: Secondary | ICD-10-CM | POA: Insufficient documentation

## 2023-01-15 DIAGNOSIS — Z905 Acquired absence of kidney: Secondary | ICD-10-CM | POA: Insufficient documentation

## 2023-01-15 DIAGNOSIS — I10 Essential (primary) hypertension: Secondary | ICD-10-CM

## 2023-01-15 DIAGNOSIS — K625 Hemorrhage of anus and rectum: Secondary | ICD-10-CM

## 2023-01-15 DIAGNOSIS — I130 Hypertensive heart and chronic kidney disease with heart failure and stage 1 through stage 4 chronic kidney disease, or unspecified chronic kidney disease: Secondary | ICD-10-CM | POA: Insufficient documentation

## 2023-01-15 LAB — CBC WITH DIFFERENTIAL (CANCER CENTER ONLY)
Abs Immature Granulocytes: 0.02 10*3/uL (ref 0.00–0.07)
Basophils Absolute: 0 10*3/uL (ref 0.0–0.1)
Basophils Relative: 1 %
Eosinophils Absolute: 0.2 10*3/uL (ref 0.0–0.5)
Eosinophils Relative: 3 %
HCT: 48.4 % (ref 39.0–52.0)
Hemoglobin: 16.2 g/dL (ref 13.0–17.0)
Immature Granulocytes: 0 %
Lymphocytes Relative: 27 %
Lymphs Abs: 1.9 10*3/uL (ref 0.7–4.0)
MCH: 30.9 pg (ref 26.0–34.0)
MCHC: 33.5 g/dL (ref 30.0–36.0)
MCV: 92.2 fL (ref 80.0–100.0)
Monocytes Absolute: 0.5 10*3/uL (ref 0.1–1.0)
Monocytes Relative: 7 %
Neutro Abs: 4.4 10*3/uL (ref 1.7–7.7)
Neutrophils Relative %: 62 %
Platelet Count: 134 10*3/uL — ABNORMAL LOW (ref 150–400)
RBC: 5.25 MIL/uL (ref 4.22–5.81)
RDW: 15.5 % (ref 11.5–15.5)
WBC Count: 7.1 10*3/uL (ref 4.0–10.5)
nRBC: 0 % (ref 0.0–0.2)

## 2023-01-15 LAB — CMP (CANCER CENTER ONLY)
ALT: 54 U/L — ABNORMAL HIGH (ref 0–44)
AST: 55 U/L — ABNORMAL HIGH (ref 15–41)
Albumin: 4.2 g/dL (ref 3.5–5.0)
Alkaline Phosphatase: 118 U/L (ref 38–126)
Anion gap: 9 (ref 5–15)
BUN: 37 mg/dL — ABNORMAL HIGH (ref 8–23)
CO2: 25 mmol/L (ref 22–32)
Calcium: 8.8 mg/dL — ABNORMAL LOW (ref 8.9–10.3)
Chloride: 105 mmol/L (ref 98–111)
Creatinine: 1.68 mg/dL — ABNORMAL HIGH (ref 0.61–1.24)
GFR, Estimated: 46 mL/min — ABNORMAL LOW (ref >60)
Glucose, Bld: 134 mg/dL — ABNORMAL HIGH (ref 70–99)
Potassium: 4.3 mmol/L (ref 3.5–5.1)
Sodium: 139 mmol/L (ref 135–145)
Total Bilirubin: 0.9 mg/dL (ref 0.0–1.2)
Total Protein: 6.9 g/dL (ref 6.5–8.1)

## 2023-01-15 LAB — LACTATE DEHYDROGENASE: LDH: 308 U/L — ABNORMAL HIGH (ref 98–192)

## 2023-01-15 NOTE — Progress Notes (Signed)
 Pt here for follow up. Pt reports that he has been tolerating lower dose Cabometyx better.

## 2023-01-15 NOTE — Assessment & Plan Note (Addendum)
 Stage IV renal cell carcinoma, 1st line treatment with Keytruda  and Axitinib , he did not tolerate Axitinib .  S/p 34 cycles of Keytruda  [ about 2 years's treatment] - stopped in Sept 2023 --> 09/2022 Biopsy via bronchoscopy pathology showed disease recurrence, metastatic clear cell carcinoma Labs are reviewed and discussed with patient.  He did not tolerate Carbozantinib 60mg  daily, tolerated 40mg  daily well. Continue.  Repeat CT scan for evaluation of treatment response.

## 2023-01-15 NOTE — Assessment & Plan Note (Signed)
 avoid nephrotoxins. Encourage oral hydration

## 2023-01-15 NOTE — Assessment & Plan Note (Addendum)
 He has internal hemorrhoids.  Recommend patient to follow up with GI.  He is on Aspirin and Plavix  Resolved.

## 2023-01-15 NOTE — Assessment & Plan Note (Signed)
 due to Carbozantinib, better controlled.  Losartan to 50mg  BID. Amlodipine 5mg  was added.

## 2023-01-15 NOTE — Progress Notes (Signed)
 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  S/p 34 cycles of Keytruda  [ about 2 years's treatment] - stopped in Sept 2023 --> 09/2022 Biopsy via bronchoscopy pathology showed disease recurrence, metastatic clear cell carcinoma Labs are reviewed and discussed with patient.  He did not tolerate Carbozantinib 60mg  daily, tolerated 40mg  daily well. Continue.  Repeat CT scan for evaluation of treatment response.   Rectal bleeding He has internal hemorrhoids.  Recommend patient to follow up with GI.  He is on Aspirin and Plavix  Resolved.   Stage 3b chronic kidney disease (HCC) avoid nephrotoxins. Encourage oral hydration  Essential hypertension due to Carbozantinib, better controlled.  Losartan to 50mg  BID. Amlodipine  5mg  was added.     Orders Placed This Encounter  Procedures   CT CHEST ABDOMEN PELVIS WO CONTRAST    Standing Status:   Future    Expected Date:   02/12/2023    Expiration Date:   01/15/2024    Preferred imaging location?:   [facility]    If indicated for the ordered procedure, I authorize the administration of oral contrast media per Radiology protocol:   Yes    Does the patient have a contrast media/X-ray dye allergy?:   No   CBC with Differential (Cancer Center Only)    Standing Status:   Future    Expected Date:   02/12/2023    Expiration Date:   01/15/2024   CMP (Cancer Center only)    Standing Status:   Future    Expected Date:   02/12/2023    Expiration Date:   01/15/2024    Patient is Spanish-speaking.   Spanish interpreter  service utilized for entire encounter   Return visit  4-5 weeks  All questions were answered. The patient knows to call the clinic with any problems, questions or concerns.  Call Babara, MD, PhD [facility] Hematology Oncology 01/15/2023   PERTINENT HISTORY-  63 y.o. male presents for follow-up of renal cell carcinoma  Oncology History  Renal cell carcinoma of left kidney (HCC)  05/23/2019 Initial Diagnosis   Renal cell carcinoma of left kidney   -05/02/2019-05/07/2019 due to gross hematuria. CT showed a 7.4 cm left lower pole renal mass concerning for RCC.  Patient also has necrotic lymphadenopathy in the mediastinum and the right supra hilar region which were highly suspicious for metastatic disease.  Bilateral lung nodules, also concerning for metastatic lung disease.  Right retrocrural lymphadenopathy with upper normal left periaortic lymph node. Patient was recommended for cytoreductive radical nephrectomy.  With history of ANCA positive vasculitis, also recommend thoracic lymphadenopathy biopsy to confirm distal metastasis.  Patient agreed to radical nephrectomy and declined bronchoscopy biopsy. He underwent left radical nephrectomy on 05/05/2019. Pathology showed pT3a pNx, RCC, conventional clear cell type, grade 3    05/23/2019 Cancer Staging   Staging form: Kidney, AJCC 8th Edition - Pathologic: Stage IV (pT3a, pNX, cM1) - Signed by Babara Call, MD on 05/23/2019   06/07/2019 - 09/25/2021 Chemotherapy   Keytruda  + Axitinib .  06/07/19 He started Axitinib  5mg  BID, stopped on 07/25/2020  due to AKI, resumed on 10/04/2019, stopped again on 05/01/2020 due to diarrhea. 09/11/2020 resumed axitinib  5 mg twice daily, stopped on 10/23/2020 due to diarrhea.  01/17/2021 Keytruda  was held due to transaminitis, immunotherapy induced liver toxicities. 02/20/2021, resumed Keytruda .  09/25/2021 last dose of Keytruda     01/12/2020 Imaging   CT showed overall marked improvement.  Comparing to CT scan in August  2021, continues to have treatment response.Mediastinal soft tissue nodule nearly completely resolved, right minor fissure nodule 8 mm, decreased in size.  Resolution of pleural fluid and thickening of the right chest.  Small pleural effusion in the left chest.  No new lung nodules.-Subtle variation of the muscle architecture of the left psoas muscle of unknown significance   04/30/2020 Imaging   CT chest abdomen pelvis without contrast showed stable disease   11/28/2020 Imaging   CT abdomen pelvis without contrast showed stable examination.  No evidence of new or progressive disease in the chest abdomen pelvis.  Stable to minimally decreased size of 5 mm pulmonary nodule in the minor fissure in the right chest.  Sigmoid colon diverticulosis without findings of acute diverticulitis.   03/08/2021 Imaging   CT scan showed NED   07/31/2021 Imaging   CT chest abdomen pelvis showed stable disease   08/27/2021 Imaging   MRI brain with and without contrast Decrease in size of lesions presumably reflecting neurocysticercosis. No evidence new metastatic disease   08/29/2021 Imaging   CT Chest abdomen pelvis w contrast 1. No findings of active malignancy. 6 by 4 mm right upper lobe nodule along the major fissure is stable and likely represents a previously treated metastatic lesion. 2. Coronary artery atherosclerosis disproportionate to atherosclerosis elsewhere in the chest, abdomen, and pelvis. 3. Mild surgical bronchiectasis in both lower lobes. 4. Mild prostatomegaly. 5. 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 Status post left nephrectomy, without recurrent or metastatic disease. 2. Similar small right-sided pulmonary nodules. 3. New or progressive posterior left upper lobe clustered nodularity and ground-glass, suspicious for minimal infection. 4.  Coronary artery atherosclerosi   09/25/2022 Imaging   CT  chest abdomen pelvis w contrast showed CHEST:   1. New nodularity in the LEFT upper lobe with new mediastinal lymphadenopathy. 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 2. No skeletal metastasis.   PELVIS:   1. No evidence of local recurrence in the LEFT nephrectomy bed. 2. No evidence of metastatic disease in the abdomen pelvis.    10/08/2022 Imaging   PET scan showed  1. Hypermetabolic mediastinal and left hilar lymphadenopathy with hypermetabolic nodularity in the posterior left upper lobe. Imaging features are compatible with metastatic disease. 2. No evidence for metastatic disease in the abdomen or pelvis. 3. Changes of avascular necrosis in the femoral heads bilaterally.    10/10/2022 Relapse/Recurrence   S/p biopsy via bronchoscopy  1. Lung, biopsy, Left upper lobe :       - METASTATIC CLEAR CELL RENAL CELL CARCINOMA   Bronchial Brushing Lung, upper lobe  Fine Needle Aspiration  - POSITIVE FOR MALIGNANCY.       - CONSISTENT WITH METASTATIC CLEAR CELL RENAL CELL CARCINOMA.     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  Patient has been referred to orthopedic surgeon in [place] which he is not able to go due to transportation.  He does not have insurance as well.  He is currently asymptomatic.  Monitor.  Patient has past medical history CAD status post PCI to LAD in 2018, on aspirin and Plavix, CHF, history of focal sclerosing/crescentic GN-ANCA positive and CKD stage III. 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 He finished 14 days of albendazole  plus praziquantel  .   He has had few side effects including poor appetite, hair loss, weight loss, bloody stool, abdominal cramping or diarrhea. All symptoms have improved since he completed the treatment. 03/16/2020 he finished tapering course of steroids  #Intermittent orthopnea,Pre-existing diastolic CHF, 05/04/2019 LVEF 60 to 65%.   Recommend him  to follow-up with cardiology-he is has not able to follow-up with cardiology due to lack of insurance coverage.  # He has frequent urination at night.  Previously tried Cardura for BPH and had to stop due hypotension. 07/2022 S/p EGD and colonoscopy - diverticulosis, non bleeding internal hemorrhoids.  Gastritis.  INTERVAL HISTORY Mark Donaldson is a 63 y.o. male who has above history reviewed by me today presents for follow up visit for metastatic RCC No additional rectal bleeding episodes.  He feels appetite is better.  Tolerates Cabozantinib 40mg  daily.     Review of systems- Review of Systems  Constitutional:  Negative for appetite change, chills, fatigue, fever and unexpected weight change.  HENT:   Negative for voice change.   Eyes:  Negative for eye problems and icterus.  Respiratory:  Negative for chest tightness and cough.   Cardiovascular:  Negative for chest pain and leg swelling.  Gastrointestinal:  Negative for abdominal distention, abdominal pain, blood in stool, diarrhea and rectal pain.  Endocrine: Negative for hot flashes.  Genitourinary:  Negative for difficulty urinating, dysuria, frequency and nocturia.   Musculoskeletal:  Positive for arthralgias and back pain.  Skin:  Negative for itching and rash.  Neurological:  Negative for light-headedness and numbness.  Hematological:  Negative for adenopathy. Does not bruise/bleed easily.  Psychiatric/Behavioral:  Negative for confusion.     No Known Allergies  Patient Active Problem List   Diagnosis Date Noted   Renal cell carcinoma of left kidney (HCC) 05/23/2019    Priority: High   Rectal bleeding 11/24/2022    Priority: Medium    Stage 3b chronic kidney disease (HCC) 10/04/2019    Priority: Medium    CNS lesion 07/07/2019    Priority: Medium    Essential hypertension 05/18/2016    Priority: Medium    IDA (iron deficiency anemia) 11/21/2021    Priority: Low   Encounter for antineoplastic  immunotherapy 07/19/2019    Priority: Low   Encounter for antineoplastic chemotherapy 07/19/2019    Priority: Low   Goals of care, counseling/discussion 05/23/2019    Priority: Low   Need for prophylactic vaccination and inoculation against influenza 11/13/2022   Gastritis without bleeding 07/30/2022   Cough 06/03/2022   Avascular necrosis of bones of both hips (HCC) 06/20/2021   Transaminitis 01/17/2021   History of CHF (congestive heart failure) 05/01/2020   Hyperglycemia 02/28/2020   Brain lesion 10/04/2019   Anemia in stage 3b chronic kidney disease (HCC) 06/07/2019   Renal cell carcinoma (HCC) 05/23/2019   Palliative care encounter    History of vasculitis    Thoracic lymphadenopathy    Lung nodules    CAD S/P percutaneous coronary angioplasty 05/02/2019   Kidney mass 05/02/2019   Hydronephrosis of left kidney 05/02/2019   UTI (urinary tract infection) 05/02/2019   AKI (acute kidney injury) (HCC) 05/02/2019   Gross hematuria 05/02/2019  Chronic diastolic CHF (congestive heart failure) (HCC) 05/02/2019   Sepsis (HCC) 05/02/2019   Severe sepsis (HCC) 05/02/2019   Preop cardiovascular exam    Hyperlipidemia 05/21/2016   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) 05/21/2016   Prediabetes 05/21/2016   Alcohol abuse 05/18/2016   Vasculitis, 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HCC) 05/18/2016   Shortness of breath 05/17/2016     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HCC)    Avascular necrosis of bones of both hips (HCC)    CAD (coronary artery disease)    Essential hypertension    Gastritis without bleeding    HFrEF (heart failure with reduced ejection fraction) (HCC)    Hyperlipidemia LDL goal <70    Iron deficiency anemia    Ischemic cardiomyopathy    Lung nodules 09/2022   left upper lobe   Mediastinal adenopathy 09/2022   Renal cell carcinoma (HCC) 05/23/2019   Renal disorder    Severe sepsis (HCC)    Stage 3b chronic kidney disease  (CKD) (HCC)      Past Surgical History:  Procedure Laterality Date   BIOPSY  07/30/2022   Procedure: BIOPSY;  Surgeon: Jinny Carmine, MD;  Location: [facility];  Service: Endoscopy;;   COLONOSCOPY WITH PROPOFOL  N/A 07/30/2022   Procedure: COLONOSCOPY WITH PROPOFOL ;  Surgeon: Jinny Carmine, MD;  Location: [facility];  Service: Endoscopy;  Laterality: N/A;  SPANISH INTERPRETER   ENDOBRONCHIAL ULTRASOUND Left 10/10/2022   Procedure: ENDOBRONCHIAL ULTRASOUND;  Surgeon: Tamea Dedra CROME, MD;  Location: [facility];  Service: Pulmonary;  Laterality: Left;   ESOPHAGOGASTRODUODENOSCOPY (EGD) WITH PROPOFOL  N/A 07/30/2022   Procedure: ESOPHAGOGASTRODUODENOSCOPY (EGD) WITH PROPOFOL ;  Surgeon: Jinny Carmine, MD;  Location: [facility];  Service: Endoscopy;  Laterality: N/A;   LAPAROSCOPIC NEPHRECTOMY, HAND ASSISTED Left 05/05/2019   Procedure: HAND ASSISTED LAPAROSCOPIC NEPHRECTOMY;  Surgeon: Penne Knee, MD;  Location: [facility];  Service: Urology;  Laterality: Left;   LEFT HEART CATH AND CORONARY ANGIOGRAPHY  05/20/2016   LAD lesion s/p PCI with Xience    Social History   Socioeconomic History   Marital status: Married    Spouse name: Charod   Number of children: 4   Years of education: Not on file   Highest education level: Not on file  Occupational History   Not on file  Tobacco Use   Smoking status: Never   Smokeless tobacco: Never  Vaping Use   Vaping status: Never Used  Substance and Sexual Activity   Alcohol use: Not Currently   Drug use: Never   Sexual activity: Not on file  Other Topics Concern   Not on file  Social History Narrative   Not on file   Social Drivers of Health   Financial Resource Strain: Not on file  Food Insecurity: No Food Insecurity (07/14/2022)   [facility]   Hunger Vital Sign    Worried About Running Out of Food in the Last Year: Never true    Ran Out of Food in the Last Year: Never true   Transportation Needs: No Transportation Needs (07/14/2022)   [facility]   [facility] - Transportation    Lack of Transportation (Medical): No    Lack of Transportation (Non-Medical): No  Physical Activity: Not on file  Stress: Not on file  Social Connections: Not on file  Intimate Partner Violence: Not on file     Family History  Problem Relation Age of Onset   Cancer Mother  Blindness Brother      Current Outpatient Medications:    albuterol  (VENTOLIN  HFA) 108 (90 Base) MCG/ACT inhaler, Inhale 2 puffs into the lungs every 6 (six) hours as needed for wheezing or shortness of breath., Disp: 6.7 g, Rfl: 2   amLODipine  (NORVASC ) 5 MG tablet, Take 5 mg by mouth daily., Disp: , Rfl:    aspirin 81 MG EC tablet, Take 81 mg by mouth daily. Swallow whole., Disp: , Rfl:    atorvastatin  (LIPITOR ) 80 MG tablet, Take 80 mg by mouth at bedtime., Disp: , Rfl:    cabozantinib (CABOMETYX ) 40 MG tablet, Take 1 tablet (40 mg total) by mouth daily. Take on an empty stomach, 1 hour before or 2 hours after meals., Disp: 30 tablet, Rfl: 2   clopidogrel (PLAVIX) 75 MG tablet, Take 75 mg by mouth daily., Disp: , Rfl:    ferrous sulfate  325 (65 FE) MG tablet, Take 1 tablet (325 mg total) by mouth 2 (two) times daily with a meal., Disp: 180 tablet, Rfl: 2   Fluticasone -Umeclidin-Vilant (TRELEGY ELLIPTA ) 100-62.5-25 MCG/ACT AEPB, Inhale 1 puff into the lungs daily., Disp: 60 each, Rfl: 11   furosemide  (LASIX ) 20 MG tablet, Take 1 tablet (20 mg total) by mouth daily., Disp: 30 tablet, Rfl: 3   losartan (COZAAR) 50 MG tablet, Take 50 mg by mouth 2 (two) times daily., Disp: , Rfl:    metoprolol  tartrate (LOPRESSOR ) 100 MG tablet, Take 150 mg by mouth daily., Disp: , Rfl:    mirtazapine (REMERON) 15 MG tablet, Take 15 mg by mouth at bedtime., Disp: , Rfl:    Physical exam:  Vitals:   01/15/23 1405  BP: (!) 138/100  Pulse: 71  Resp: 18  Temp: 98.5 F (36.9 C)  Weight:  158 lb 11.2 oz (72 kg)    Physical Exam Constitutional:      General: He is not in acute distress.    Appearance: He is not diaphoretic.  HENT:     Head: Normocephalic and atraumatic.  Eyes:     General: No scleral icterus. Cardiovascular:     Rate and Rhythm: Normal rate and regular rhythm.  Pulmonary:     Effort: Pulmonary effort is normal. No respiratory distress.  Abdominal:     General: There is no distension.     Palpations: Abdomen is soft.     Tenderness: There is no abdominal tenderness.  Musculoskeletal:        General: Normal range of motion.     Cervical back: Normal range of motion.  Skin:    General: Skin is warm and dry.     Findings: No erythema.  Neurological:     Mental Status: He is alert and oriented to person, place, and time. Mental status is at baseline.     Cranial Nerves: No cranial nerve deficit.     Motor: No abnormal muscle tone.  Psychiatric:        Mood and Affect: Mood and affect normal.       Labs    Latest Ref Rng & Units 01/15/2023    1:45 PM 12/10/2022    2:28 PM 11/13/2022   10:04 AM  CBC  WBC 4.0 - 10.5 K/uL 7.1  7.0  8.2   Hemoglobin 13.0 - 17.0 g/dL 83.7  85.4  82.8   Hematocrit 39.0 - 52.0 % 48.4  43.4  51.2   Platelets 150 - 400 K/uL 134  134  115       Latest  Ref Rng & Units 01/15/2023    1:46 PM 12/10/2022    2:28 PM 11/13/2022   10:04 AM  CMP  Glucose 70 - 99 mg/dL 865  885  894   BUN 8 - 23 mg/dL 37  45  26   Creatinine 0.61 - 1.24 mg/dL 8.31  8.14  8.34   Sodium 135 - 145 mmol/L 139  139  134   Potassium 3.5 - 5.1 mmol/L 4.3  4.6  4.2   Chloride 98 - 111 mmol/L 105  104  102   CO2 22 - 32 mmol/L 25  27  24    Calcium  8.9 - 10.3 mg/dL 8.8  9.0  9.2   Total Protein 6.5 - 8.1 g/dL 6.9  6.4  6.7   Total Bilirubin 0.0 - 1.2 mg/dL 0.9  0.9  0.9   Alkaline Phos 38 - 126 U/L 118  110  168   AST 15 - 41 U/L 55  45  78   ALT 0 - 44 U/L 54  40  62       RADIOGRAPHIC STUDIES: I have personally reviewed the  radiological images as listed and agreed with the findings in the report. MR Brain W Wo Contrast Result Date: 12/10/2022 CLINICAL DATA:  Renal cell carcinoma.  Staging EXAM: MRI HEAD WITHOUT AND WITH CONTRAST TECHNIQUE: Multiplanar, multiecho pulse sequences of the brain and surrounding structures were obtained without and with intravenous contrast. CONTRAST:  7mL GADAVIST  GADOBUTROL  1 MMOL/ML IV SOLN COMPARISON:  08/26/2021 FINDINGS: Brain: Areas of ring enhancement in the brain have resolved. There is history of both neurocysticercosis and metastatic disease. No abnormal intracranial enhancement. Unchanged areas of nodular gradient hypointensity scattered in the right cerebellum and bilateral superficial cerebrum, likely mineralization. No new enhancement to suggesting metastatic disease. No infarct, hemorrhage, hydrocephalus, mass effect, or brain edema. Small FLAIR hyperintensities scattered in the cerebral white matter with nonspecific pattern. Vascular: Normal flow voids and vascular enhancements. Skull and upper cervical spine: Normal marrow signal. Sinuses/Orbits: Negative IMPRESSION: No evidence of active disease.  No edema or abnormal enhancement. Electronically Signed   By: Dorn Roulette M.D.   On: 12/10/2022 07:37

## 2023-01-19 ENCOUNTER — Other Ambulatory Visit: Payer: Self-pay

## 2023-01-20 ENCOUNTER — Other Ambulatory Visit: Payer: Self-pay

## 2023-02-10 ENCOUNTER — Telehealth: Payer: Self-pay

## 2023-02-10 NOTE — Telephone Encounter (Signed)
Received message from interpreting services stating that "pt called and said that the cancer pill was reduced to 40mg s but that he still has bad diarrhea, can't eat, has a bad taste in his mouth. etc."  Called pt to follow up and advised for him to stop taking medication. Pt states that after a month of taking decreased dose he started having the symptoms so he stopped taking the medication approx 4 days ago. Pt also has been scheduled for lab/MD/ IVF on 1/30. Pt aware of appt details.

## 2023-02-12 ENCOUNTER — Encounter: Payer: Self-pay | Admitting: Oncology

## 2023-02-12 ENCOUNTER — Inpatient Hospital Stay: Payer: Self-pay | Admitting: Oncology

## 2023-02-12 ENCOUNTER — Inpatient Hospital Stay: Payer: Self-pay

## 2023-02-12 ENCOUNTER — Ambulatory Visit
Admission: RE | Admit: 2023-02-12 | Discharge: 2023-02-12 | Disposition: A | Discharge status: Home / Self Care | Payer: Self-pay | Source: Ambulatory Visit | Attending: Oncology | Admitting: Oncology

## 2023-02-12 VITALS — BP 135/95 | HR 71 | Temp 96.4°F | Resp 18 | Wt 161.0 lb

## 2023-02-12 DIAGNOSIS — C642 Malignant neoplasm of left kidney, except renal pelvis: Secondary | ICD-10-CM

## 2023-02-12 DIAGNOSIS — T451X5A Adverse effect of antineoplastic and immunosuppressive drugs, initial encounter: Secondary | ICD-10-CM | POA: Insufficient documentation

## 2023-02-12 DIAGNOSIS — N1832 Chronic kidney disease, stage 3b: Secondary | ICD-10-CM

## 2023-02-12 DIAGNOSIS — K521 Toxic gastroenteritis and colitis: Secondary | ICD-10-CM

## 2023-02-12 LAB — CMP (CANCER CENTER ONLY)
ALT: 37 U/L (ref 0–44)
AST: 39 U/L (ref 15–41)
Albumin: 4 g/dL (ref 3.5–5.0)
Alkaline Phosphatase: 88 U/L (ref 38–126)
Anion gap: 9 (ref 5–15)
BUN: 36 mg/dL — ABNORMAL HIGH (ref 8–23)
CO2: 25 mmol/L (ref 22–32)
Calcium: 8.7 mg/dL — ABNORMAL LOW (ref 8.9–10.3)
Chloride: 101 mmol/L (ref 98–111)
Creatinine: 1.77 mg/dL — ABNORMAL HIGH (ref 0.61–1.24)
GFR, Estimated: 43 mL/min — ABNORMAL LOW (ref >60)
Glucose, Bld: 105 mg/dL — ABNORMAL HIGH (ref 70–99)
Potassium: 4.8 mmol/L (ref 3.5–5.1)
Sodium: 135 mmol/L (ref 135–145)
Total Bilirubin: 1.1 mg/dL (ref 0.0–1.2)
Total Protein: 6.8 g/dL (ref 6.5–8.1)

## 2023-02-12 LAB — CBC WITH DIFFERENTIAL (CANCER CENTER ONLY)
Abs Immature Granulocytes: 0.02 10*3/uL (ref 0.00–0.07)
Basophils Absolute: 0 10*3/uL (ref 0.0–0.1)
Basophils Relative: 0 %
Eosinophils Absolute: 0.2 10*3/uL (ref 0.0–0.5)
Eosinophils Relative: 3 %
HCT: 44 % (ref 39.0–52.0)
Hemoglobin: 14.9 g/dL (ref 13.0–17.0)
Immature Granulocytes: 0 %
Lymphocytes Relative: 31 %
Lymphs Abs: 2.8 10*3/uL (ref 0.7–4.0)
MCH: 31.9 pg (ref 26.0–34.0)
MCHC: 33.9 g/dL (ref 30.0–36.0)
MCV: 94.2 fL (ref 80.0–100.0)
Monocytes Absolute: 0.8 10*3/uL (ref 0.1–1.0)
Monocytes Relative: 9 %
Neutro Abs: 5 10*3/uL (ref 1.7–7.7)
Neutrophils Relative %: 57 %
Platelet Count: 141 10*3/uL — ABNORMAL LOW (ref 150–400)
RBC: 4.67 MIL/uL (ref 4.22–5.81)
RDW: 15.4 % (ref 11.5–15.5)
WBC Count: 8.9 10*3/uL (ref 4.0–10.5)
nRBC: 0 % (ref 0.0–0.2)

## 2023-02-12 NOTE — Progress Notes (Addendum)
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  S/p 34 cycles of Keytruda [ about 2 years's treatment] - stopped in Sept 2023 --> 09/2022 Biopsy via bronchoscopy pathology showed disease recurrence, metastatic clear cell carcinoma Labs are reviewed and discussed with patient.  He did not tolerate Carbozantinib 60mg  or 40mg  daily. Recommend patient to hold Carbozantinib for now.  Repeat CT scan for showed positive response, recommend second dose reduction to Carbozantinib 20mg  daily  Stage 3b chronic kidney disease (HCC) avoid nephrotoxins. Encourage oral hydration  Chemotherapy induced diarrhea Improved after stop of Carbozantinin Monitor symptoms.     No orders of the defined types were placed in this encounter.   Patient is Spanish-speaking.   Spanish interpreter service utilized for entire encounter   Return visit  TBD, depending on CT results.  All questions were answered. The patient knows to call the clinic with any problems, questions or concerns.  [name], MD, PhD [facility] Hematology Oncology 02/12/2023   PERTINENT HISTORY-  63 y.o. male presents for follow-up of renal cell carcinoma  Oncology History  Renal cell carcinoma of left kidney (HCC)  05/23/2019 Initial Diagnosis   Renal cell carcinoma of left kidney   -05/02/2019-05/07/2019 due to gross hematuria. CT showed a 7.4 cm left lower pole renal mass concerning for RCC.  Patient also has necrotic lymphadenopathy in the mediastinum and the right supra hilar region which were highly  suspicious for metastatic disease.  Bilateral lung nodules, also concerning for metastatic lung disease.  Right retrocrural lymphadenopathy with upper normal left periaortic lymph node. Patient was recommended for cytoreductive radical nephrectomy.  With history of ANCA positive vasculitis, also recommend thoracic lymphadenopathy biopsy to confirm distal metastasis.  Patient agreed to radical nephrectomy and declined bronchoscopy biopsy. He underwent left radical nephrectomy on 05/05/2019. Pathology showed pT3a pNx, RCC, conventional clear cell type, grade 3    05/23/2019 Cancer Staging   Staging form: Kidney, AJCC 8th Edition - Pathologic: Stage IV (pT3a, pNX, cM1) - Signed by [name], MD on 05/23/2019   06/07/2019 - 09/25/2021 Chemotherapy   Keytruda + Axitinib.  06/07/19 He started Axitinib 5mg  BID, stopped on 07/25/2020 due to AKI, resumed on 10/04/2019, stopped again on 05/01/2020 due to diarrhea. 09/11/2020 resumed axitinib 5 mg twice daily, stopped on 10/23/2020 due to diarrhea.  01/17/2021 Keytruda was held due to transaminitis, immunotherapy induced liver toxicities. 02/20/2021, resumed Keytruda.  09/25/2021 last dose of Keytruda    01/12/2020 Imaging   CT showed overall marked improvement.  Comparing to CT scan in August 2021, continues to have treatment response.Mediastinal soft tissue nodule nearly completely resolved, right minor fissure nodule 8 mm, decreased in size.  Resolution of pleural fluid and thickening of the right chest.  Small pleural effusion in the left chest.  No new lung nodules.-Subtle variation of the muscle architecture of the left psoas muscle of unknown significance   04/30/2020 Imaging   CT chest abdomen pelvis without contrast showed stable disease   11/28/2020 Imaging   CT abdomen pelvis without contrast showed stable examination.  No evidence  of new or progressive disease in the chest abdomen pelvis.  Stable to minimally decreased size of 5 mm pulmonary nodule in the  minor fissure in the right chest.  Sigmoid colon diverticulosis without findings of acute diverticulitis.   03/08/2021 Imaging   CT scan showed NED   07/31/2021 Imaging   CT chest abdomen pelvis showed stable disease   08/27/2021 Imaging   MRI brain with and without contrast Decrease in size of lesions presumably reflecting neurocysticercosis. No evidence new metastatic disease   08/29/2021 Imaging   CT Chest abdomen pelvis w contrast 1. No findings of active malignancy. 6 by 4 mm right upper lobe nodule along the major fissure is stable and likely represents a previously treated metastatic lesion. 2. Coronary artery atherosclerosis disproportionate to atherosclerosis elsewhere in the chest, abdomen, and pelvis. 3. Mild surgical bronchiectasis in both lower lobes. 4. Mild prostatomegaly. 5. 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 Status post left nephrectomy, without recurrent or metastatic disease. 2. Similar small right-sided pulmonary nodules. 3. New or progressive posterior left upper lobe clustered nodularity and ground-glass, suspicious for minimal infection. 4.  Coronary artery atherosclerosi   09/25/2022 Imaging   CT chest abdomen pelvis w contrast showed CHEST:   1. New nodularity in the LEFT upper lobe with new mediastinal lymphadenopathy. 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 2. No skeletal metastasis.   PELVIS:   1. No evidence of local recurrence in the LEFT nephrectomy bed. 2. No evidence of metastatic disease in the abdomen pelvis.    10/08/2022 Imaging   PET scan showed  1. Hypermetabolic mediastinal and left hilar lymphadenopathy with hypermetabolic nodularity in the posterior left upper lobe. Imaging features are compatible with metastatic disease. 2. No evidence for metastatic disease in the abdomen or pelvis. 3. Changes of avascular necrosis in  the femoral heads bilaterally.    10/10/2022 Relapse/Recurrence   S/p biopsy via bronchoscopy  1. Lung, biopsy, Left upper lobe :       - METASTATIC CLEAR CELL RENAL CELL CARCINOMA   Bronchial Brushing Lung, upper lobe  Fine Needle Aspiration  - POSITIVE FOR MALIGNANCY.       - CONSISTENT WITH METASTATIC CLEAR CELL RENAL CELL CARCINOMA.     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  Patient has been referred to orthopedic surgeon in [place] which he is not able to go due to transportation.  He does not have insurance as well.  He is currently asymptomatic.  Monitor.  Patient has past medical history CAD status post PCI to LAD in 2018, on aspirin and Plavix, CHF, history of focal sclerosing/crescentic GN-ANCA positive and CKD stage III. 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 He finished 14 days of albendazole plus praziquantel .   He has had few side effects including poor appetite, hair loss, weight loss, bloody stool, abdominal cramping or diarrhea. All symptoms have improved since he completed the treatment. 03/16/2020 he finished tapering course of steroids  #Intermittent orthopnea,Pre-existing diastolic CHF, 05/04/2019 LVEF 60 to 65%.   Recommend him to follow-up with cardiology-he is has not able to follow-up with cardiology due to lack of insurance coverage.  # He has frequent urination at night.  Previously tried Cardura for BPH and had to stop due hypotension. 07/2022 S/p EGD and colonoscopy - diverticulosis, non bleeding internal hemorrhoids.  Gastritis.  INTERVAL  HISTORY Mark Donaldson is a 63 y.o. male who has above history reviewed by me today presents for follow up visit for metastatic RCC stopped Cabozantinib 40mg  daily due to bad diarrhea, can't eat, has a bad taste in his mouth.  Since he stopped taking it, taste and appetite have improved.  Blurry vision, better after stopped medication.     Review  of systems- Review of Systems  Constitutional:  Positive for appetite change. Negative for chills, fatigue, fever and unexpected weight change.  HENT:   Negative for voice change.   Eyes:  Negative for eye problems and icterus.  Respiratory:  Negative for chest tightness and cough.   Cardiovascular:  Negative for chest pain and leg swelling.  Gastrointestinal:  Positive for diarrhea. Negative for abdominal distention, abdominal pain, blood in stool and rectal pain.  Endocrine: Negative for hot flashes.  Genitourinary:  Negative for difficulty urinating, dysuria, frequency and nocturia.   Musculoskeletal:  Positive for arthralgias and back pain.  Skin:  Negative for itching and rash.  Neurological:  Negative for light-headedness and numbness.  Hematological:  Negative for adenopathy. Does not bruise/bleed easily.  Psychiatric/Behavioral:  Negative for confusion.     No Known Allergies  Patient Active Problem List   Diagnosis Date Noted   Renal cell carcinoma of left kidney (HCC) 05/23/2019    Priority: High   Chemotherapy induced diarrhea 02/12/2023    Priority: Medium    Rectal bleeding 11/24/2022    Priority: Medium    Stage 3b chronic kidney disease (HCC) 10/04/2019    Priority: Medium    CNS lesion 07/07/2019    Priority: Medium    Essential hypertension 05/18/2016    Priority: Medium    IDA (iron deficiency anemia) 11/21/2021    Priority: Low   Encounter for antineoplastic immunotherapy 07/19/2019    Priority: Low   Encounter for antineoplastic chemotherapy 07/19/2019    Priority: Low   Goals of care, counseling/discussion 05/23/2019    Priority: Low   Need for prophylactic vaccination and inoculation against influenza 11/13/2022   Gastritis without bleeding 07/30/2022   Cough 06/03/2022   Avascular necrosis of bones of both hips (HCC) 06/20/2021   Transaminitis 01/17/2021   History of CHF (congestive heart failure) 05/01/2020   Hyperglycemia 02/28/2020   Brain  lesion 10/04/2019   Anemia in stage 3b chronic kidney disease (HCC) 06/07/2019   Renal cell carcinoma (HCC) 05/23/2019   Palliative care encounter    History of vasculitis    Thoracic lymphadenopathy    Lung nodules    CAD S/P percutaneous coronary angioplasty 05/02/2019   Kidney mass 05/02/2019   Hydronephrosis of left kidney 05/02/2019   UTI (urinary tract infection) 05/02/2019   AKI (acute kidney injury) (HCC) 05/02/2019   Gross hematuria 05/02/2019   Chronic diastolic CHF (congestive heart failure) (HCC) 05/02/2019   Sepsis (HCC) 05/02/2019   Severe sepsis (HCC) 05/02/2019   Preop cardiovascular exam    Hyperlipidemia 05/21/2016   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) 05/21/2016   Prediabetes 05/21/2016   Alcohol abuse 05/18/2016   Vasculitis, 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HCC) 05/18/2016   Shortness of breath 05/17/2016     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HCC)    Avascular necrosis of bones of both hips (HCC)    CAD (coronary artery disease)    Essential hypertension    Gastritis without bleeding    HFrEF (heart failure with reduced ejection fraction) (HCC)    Hyperlipidemia  LDL goal <70    Iron deficiency anemia    Ischemic cardiomyopathy    Lung nodules 09/2022   left upper lobe   Mediastinal adenopathy 09/2022   Renal cell carcinoma (HCC) 05/23/2019   Renal disorder    Severe sepsis (HCC)    Stage 3b chronic kidney disease (CKD) (HCC)      Past Surgical History:  Procedure Laterality Date   BIOPSY  07/30/2022   Procedure: BIOPSY;  Surgeon: Midge Minium, MD;  Location: [facility];  Service: Endoscopy;;   COLONOSCOPY WITH PROPOFOL N/A 07/30/2022   Procedure: COLONOSCOPY WITH PROPOFOL;  Surgeon: Midge Minium, MD;  Location: [facility];  Service: Endoscopy;  Laterality: N/A;  SPANISH INTERPRETER   ENDOBRONCHIAL ULTRASOUND Left 10/10/2022   Procedure: ENDOBRONCHIAL ULTRASOUND;  Surgeon: Salena Saner, MD;   Location: [facility] OR[facility];  Service: Pulmonary;  Laterality: Left;   ESOPHAGOGASTRODUODENOSCOPY (EGD) WITH PROPOFOL N/A 07/30/2022   Procedure: ESOPHAGOGASTRODUODENOSCOPY (EGD) WITH PROPOFOL;  Surgeon: Midge Minium, MD;  Location: [facility];  Service: Endoscopy;  Laterality: N/A;   LAPAROSCOPIC NEPHRECTOMY, HAND ASSISTED Left 05/05/2019   Procedure: HAND ASSISTED LAPAROSCOPIC NEPHRECTOMY;  Surgeon: Vanna Scotland, MD;  Location: [facility] OR[facility];  Service: Urology;  Laterality: Left;   LEFT HEART CATH AND CORONARY ANGIOGRAPHY  05/20/2016   LAD lesion s/p PCI with Xience    Social History   Socioeconomic History   Marital status: Married    Spouse name: Johm   Number of children: 4   Years of education: Not on file   Highest education level: Not on file  Occupational History   Not on file  Tobacco Use   Smoking status: Never   Smokeless tobacco: Never  Vaping Use   Vaping status: Never Used  Substance and Sexual Activity   Alcohol use: Not Currently   Drug use: Never   Sexual activity: Not on file  Other Topics Concern   Not on file  Social History Narrative   Not on file   Social Drivers of Health   Financial Resource Strain: Not on file  Food Insecurity: No Food Insecurity (07/14/2022)   [facility]   Hunger Vital Sign    Worried About Running Out of Food in the Last Year: Never true    Ran Out of Food in the Last Year: Never true  Transportation Needs: No Transportation Needs (07/14/2022)   [facility]   [facility] - Transportation    Lack of Transportation (Medical): No    Lack of Transportation (Non-Medical): No  Physical Activity: Not on file  Stress: Not on file  Social Connections: Not on file  Intimate Partner Violence: Not on file     Family History  Problem Relation Age of Onset   Cancer Mother    Blindness Brother      Current Outpatient Medications:    albuterol (VENTOLIN HFA)  108 (90 Base) MCG/ACT inhaler, Inhale 2 puffs into the lungs every 6 (six) hours as needed for wheezing or shortness of breath., Disp: 6.7 g, Rfl: 2   amLODipine (NORVASC) 5 MG tablet, Take 5 mg by mouth daily., Disp: , Rfl:    aspirin 81 MG EC tablet, Take 81 mg by mouth daily. Swallow whole., Disp: , Rfl:    atorvastatin (LIPITOR) 80 MG tablet, Take 80 mg by mouth at bedtime., Disp: , Rfl:    clopidogrel (PLAVIX) 75 MG tablet, Take 75 mg by mouth daily., Disp: , Rfl:    ferrous  sulfate 325 (65 FE) MG tablet, Take 1 tablet (325 mg total) by mouth 2 (two) times daily with a meal., Disp: 180 tablet, Rfl: 2   Fluticasone-Umeclidin-Vilant (TRELEGY ELLIPTA) 100-62.5-25 MCG/ACT AEPB, Inhale 1 puff into the lungs daily., Disp: 60 each, Rfl: 11   furosemide (LASIX) 20 MG tablet, Take 1 tablet (20 mg total) by mouth daily., Disp: 30 tablet, Rfl: 3   losartan (COZAAR) 50 MG tablet, Take 50 mg by mouth 2 (two) times daily., Disp: , Rfl:    metoprolol tartrate (LOPRESSOR) 100 MG tablet, Take 150 mg by mouth daily., Disp: , Rfl:    mirtazapine (REMERON) 15 MG tablet, Take 15 mg by mouth at bedtime., Disp: , Rfl:    Physical exam:  Vitals:   02/12/23 1128  BP: (!) 135/95  Pulse: 71  Resp: 18  Temp: (!) 96.4 F (35.8 C)  Weight: 161 lb (73 kg)    Physical Exam Constitutional:      General: He is not in acute distress.    Appearance: He is not diaphoretic.  HENT:     Head: Normocephalic and atraumatic.  Eyes:     General: No scleral icterus. Cardiovascular:     Rate and Rhythm: Normal rate and regular rhythm.  Pulmonary:     Effort: Pulmonary effort is normal. No respiratory distress.  Abdominal:     General: There is no distension.     Palpations: Abdomen is soft.     Tenderness: There is no abdominal tenderness.  Musculoskeletal:        General: Normal range of motion.     Cervical back: Normal range of motion.  Skin:    General: Skin is warm and dry.     Findings: No erythema.   Neurological:     Mental Status: He is alert and oriented to person, place, and time. Mental status is at baseline.     Cranial Nerves: No cranial nerve deficit.     Motor: No abnormal muscle tone.  Psychiatric:        Mood and Affect: Mood and affect normal.       Labs    Latest Ref Rng & Units 02/12/2023   11:12 AM 01/15/2023    1:45 PM 12/10/2022    2:28 PM  CBC  WBC 4.0 - 10.5 K/uL 8.9  7.1  7.0   Hemoglobin 13.0 - 17.0 g/dL 47.8  29.5  62.1   Hematocrit 39.0 - 52.0 % 44.0  48.4  43.4   Platelets 150 - 400 K/uL 141  134  134       Latest Ref Rng & Units 02/12/2023   11:11 AM 01/15/2023    1:46 PM 12/10/2022    2:28 PM  CMP  Glucose 70 - 99 mg/dL 308  657  846   BUN 8 - 23 mg/dL 36  37  45   Creatinine 0.61 - 1.24 mg/dL 9.62  9.52  8.41   Sodium 135 - 145 mmol/L 135  139  139   Potassium 3.5 - 5.1 mmol/L 4.8  4.3  4.6   Chloride 98 - 111 mmol/L 101  105  104   CO2 22 - 32 mmol/L 25  25  27    Calcium 8.9 - 10.3 mg/dL 8.7  8.8  9.0   Total Protein 6.5 - 8.1 g/dL 6.8  6.9  6.4   Total Bilirubin 0.0 - 1.2 mg/dL 1.1  0.9  0.9   Alkaline Phos 38 - 126 U/L 88  118  110   AST 15 - 41 U/L 39  55  45   ALT 0 - 44 U/L 37  54  40       RADIOGRAPHIC STUDIES: I have personally reviewed the radiological images as listed and agreed with the findings in the report. MR Brain W Wo Contrast Result Date: 12/10/2022 CLINICAL DATA:  Renal cell carcinoma.  Staging EXAM: MRI HEAD WITHOUT AND WITH CONTRAST TECHNIQUE: Multiplanar, multiecho pulse sequences of the brain and surrounding structures were obtained without and with intravenous contrast. CONTRAST:  7mL GADAVIST GADOBUTROL 1 MMOL/ML IV SOLN COMPARISON:  08/26/2021 FINDINGS: Brain: Areas of ring enhancement in the brain have resolved. There is history of both neurocysticercosis and metastatic disease. No abnormal intracranial enhancement. Unchanged areas of nodular gradient hypointensity scattered in the right cerebellum and bilateral  superficial cerebrum, likely mineralization. No new enhancement to suggesting metastatic disease. No infarct, hemorrhage, hydrocephalus, mass effect, or brain edema. Small FLAIR hyperintensities scattered in the cerebral white matter with nonspecific pattern. Vascular: Normal flow voids and vascular enhancements. Skull and upper cervical spine: Normal marrow signal. Sinuses/Orbits: Negative IMPRESSION: No evidence of active disease.  No edema or abnormal enhancement. Electronically Signed   By: Tiburcio Pea M.D.   On: 12/10/2022 07:37

## 2023-02-12 NOTE — Progress Notes (Signed)
Pt here for follow up. Reports that he stopped taking Cabometyx due to loss of taste and appetite, diarrhea. Pt reports vision has been slightly blurrier. Since he stopped taking it, taste and appetite have improved.

## 2023-02-12 NOTE — Progress Notes (Signed)
No fluids today per MD.

## 2023-02-12 NOTE — Assessment & Plan Note (Addendum)
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  S/p 34 cycles of Keytruda [ about 2 years's treatment] - stopped in Sept 2023 --> 09/2022 Biopsy via bronchoscopy pathology showed disease recurrence, metastatic clear cell carcinoma Labs are reviewed and discussed with patient.  He did not tolerate Carbozantinib 60mg  or 40mg  daily. Recommend patient to hold Carbozantinib for now.  Repeat CT scan for showed positive response, recommend second dose reduction to Carbozantinib 20mg  daily

## 2023-02-12 NOTE — Assessment & Plan Note (Signed)
Improved after stop of Carbozantinin Monitor symptoms.

## 2023-02-12 NOTE — Assessment & Plan Note (Signed)
avoid nephrotoxins. Encourage oral hydration

## 2023-02-14 NOTE — Addendum Note (Signed)
Addended by: [name] on: 02/14/2023 02:21 PM   Modules accepted: Orders

## 2023-02-16 ENCOUNTER — Telehealth: Payer: Self-pay

## 2023-02-16 ENCOUNTER — Other Ambulatory Visit: Payer: Self-pay | Admitting: Pharmacist

## 2023-02-16 DIAGNOSIS — C642 Malignant neoplasm of left kidney, except renal pelvis: Secondary | ICD-10-CM

## 2023-02-16 MED ORDER — CABOMETYX 20 MG PO TABS
20.0000 mg | ORAL_TABLET | Freq: Every day | ORAL | 2 refills | Status: DC
Start: 2023-02-16 — End: 2023-05-08

## 2023-02-16 NOTE — Telephone Encounter (Signed)
-----   Message from Remi Haggard sent at 02/16/2023  9:43 AM EST ----- Rx for new dose sent to assistance program for the patient. ----- Message ----- From: [name], MD Sent: 02/14/2023   2:31 PM EST To: Coralee Rud, RN; #  Nadara Mode and Romeo Apple,  Patient did not tolerate cabozantinib 40mg , please help to get him on 20mg  daily.  I recommend him to take 2 weeks of break and start in 3 weeks. Thank you  I plan to see him back 2 weeks after he starts 20mg  daily. CT shows that this medication is effective Lab MD cbc cmp Thanks   Janyth Contes

## 2023-02-16 NOTE — Telephone Encounter (Signed)
Spoke to pt and informed him of MD recommendation. Advised him of new appt details and that he needs to start medication in 3 weeks. Will follow up with pt next week to ensure he has received medication.   Please move appt on 2/13 to 3/4 Lab (10:30) MD (11)  spanish Interpreter needed.   Pt aware of appt changes.

## 2023-02-16 NOTE — Progress Notes (Signed)
Dr. Cathie Hoops dose decreasing patient to 20mg  daily dosing. New rx sent to [facility] for patient.

## 2023-02-26 ENCOUNTER — Ambulatory Visit: Payer: Self-pay | Admitting: Oncology

## 2023-02-26 ENCOUNTER — Other Ambulatory Visit: Payer: Self-pay

## 2023-03-17 ENCOUNTER — Inpatient Hospital Stay: Payer: Self-pay | Attending: Oncology

## 2023-03-17 ENCOUNTER — Encounter: Payer: Self-pay | Admitting: Oncology

## 2023-03-17 ENCOUNTER — Inpatient Hospital Stay (HOSPITAL_BASED_OUTPATIENT_CLINIC_OR_DEPARTMENT_OTHER): Payer: Self-pay | Admitting: Oncology

## 2023-03-17 VITALS — BP 118/88 | HR 69 | Temp 96.7°F | Resp 18 | Wt 162.8 lb

## 2023-03-17 DIAGNOSIS — N1832 Chronic kidney disease, stage 3b: Secondary | ICD-10-CM

## 2023-03-17 DIAGNOSIS — C642 Malignant neoplasm of left kidney, except renal pelvis: Secondary | ICD-10-CM

## 2023-03-17 DIAGNOSIS — I130 Hypertensive heart and chronic kidney disease with heart failure and stage 1 through stage 4 chronic kidney disease, or unspecified chronic kidney disease: Secondary | ICD-10-CM | POA: Insufficient documentation

## 2023-03-17 DIAGNOSIS — D631 Anemia in chronic kidney disease: Secondary | ICD-10-CM | POA: Insufficient documentation

## 2023-03-17 DIAGNOSIS — Z79899 Other long term (current) drug therapy: Secondary | ICD-10-CM | POA: Insufficient documentation

## 2023-03-17 DIAGNOSIS — I5042 Chronic combined systolic (congestive) and diastolic (congestive) heart failure: Secondary | ICD-10-CM | POA: Insufficient documentation

## 2023-03-17 DIAGNOSIS — Z7982 Long term (current) use of aspirin: Secondary | ICD-10-CM | POA: Insufficient documentation

## 2023-03-17 DIAGNOSIS — R7401 Elevation of levels of liver transaminase levels: Secondary | ICD-10-CM

## 2023-03-17 DIAGNOSIS — Z85528 Personal history of other malignant neoplasm of kidney: Secondary | ICD-10-CM | POA: Insufficient documentation

## 2023-03-17 DIAGNOSIS — G969 Disorder of central nervous system, unspecified: Secondary | ICD-10-CM

## 2023-03-17 DIAGNOSIS — Z7902 Long term (current) use of antithrombotics/antiplatelets: Secondary | ICD-10-CM | POA: Insufficient documentation

## 2023-03-17 DIAGNOSIS — T451X5A Adverse effect of antineoplastic and immunosuppressive drugs, initial encounter: Secondary | ICD-10-CM

## 2023-03-17 LAB — CBC WITH DIFFERENTIAL/PLATELET
Abs Immature Granulocytes: 0.03 10*3/uL (ref 0.00–0.07)
Basophils Absolute: 0.1 10*3/uL (ref 0.0–0.1)
Basophils Relative: 1 %
Eosinophils Absolute: 0.4 10*3/uL (ref 0.0–0.5)
Eosinophils Relative: 4 %
HCT: 45.4 % (ref 39.0–52.0)
Hemoglobin: 15.1 g/dL (ref 13.0–17.0)
Immature Granulocytes: 0 %
Lymphocytes Relative: 28 %
Lymphs Abs: 2.7 10*3/uL (ref 0.7–4.0)
MCH: 32 pg (ref 26.0–34.0)
MCHC: 33.3 g/dL (ref 30.0–36.0)
MCV: 96.2 fL (ref 80.0–100.0)
Monocytes Absolute: 1.1 10*3/uL — ABNORMAL HIGH (ref 0.1–1.0)
Monocytes Relative: 11 %
Neutro Abs: 5.5 10*3/uL (ref 1.7–7.7)
Neutrophils Relative %: 56 %
Platelets: 149 10*3/uL — ABNORMAL LOW (ref 150–400)
RBC: 4.72 MIL/uL (ref 4.22–5.81)
RDW: 13.9 % (ref 11.5–15.5)
WBC: 9.8 10*3/uL (ref 4.0–10.5)
nRBC: 0 % (ref 0.0–0.2)

## 2023-03-17 LAB — COMPREHENSIVE METABOLIC PANEL WITH GFR
ALT: 52 U/L — ABNORMAL HIGH (ref 0–44)
AST: 52 U/L — ABNORMAL HIGH (ref 15–41)
Albumin: 4.2 g/dL (ref 3.5–5.0)
Alkaline Phosphatase: 127 U/L — ABNORMAL HIGH (ref 38–126)
Anion gap: 9 (ref 5–15)
BUN: 47 mg/dL — ABNORMAL HIGH (ref 8–23)
CO2: 26 mmol/L (ref 22–32)
Calcium: 9.4 mg/dL (ref 8.9–10.3)
Chloride: 102 mmol/L (ref 98–111)
Creatinine, Ser: 1.96 mg/dL — ABNORMAL HIGH (ref 0.61–1.24)
GFR, Estimated: 38 mL/min — ABNORMAL LOW
Glucose, Bld: 109 mg/dL — ABNORMAL HIGH (ref 70–99)
Potassium: 4.4 mmol/L (ref 3.5–5.1)
Sodium: 137 mmol/L (ref 135–145)
Total Bilirubin: 0.9 mg/dL (ref 0.0–1.2)
Total Protein: 7 g/dL (ref 6.5–8.1)

## 2023-03-17 NOTE — Assessment & Plan Note (Signed)
 Likely due to cabozantinib.  Monitor.

## 2023-03-17 NOTE — Assessment & Plan Note (Signed)
 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  S/p 34 cycles of Keytruda [ about 2 years's treatment] - stopped in Sept 2023 --> 09/2022 Biopsy via bronchoscopy pathology showed disease recurrence, metastatic clear cell carcinoma  He did not tolerate Carbozantinib 60mg  or 40mg  daily 02/12/2023 CT scan for showed positive response, he is currently on second dose reduction of Carbozantinib 20mg  daily Labs are reviewed and discussed with patient. So far he tolerates well and continue

## 2023-03-17 NOTE — Assessment & Plan Note (Signed)
 avoid nephrotoxins. Encourage oral hydration

## 2023-03-17 NOTE — Assessment & Plan Note (Signed)
 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 12/10/2023 MRI brain showed No evidence of active disease. No edema or abnormal enhancement

## 2023-03-17 NOTE — Progress Notes (Signed)
 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  S/p 34 cycles of Keytruda [ about 2 years's treatment] - stopped in Sept 2023 --> 09/2022 Biopsy via bronchoscopy pathology showed disease recurrence, metastatic clear cell carcinoma  He did not tolerate Carbozantinib 60mg  or 40mg  daily 02/12/2023 CT scan for showed positive response, he is currently on second dose reduction of Carbozantinib 20mg  daily Labs are reviewed and discussed with patient. So far he tolerates well and continue  CNS lesion 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 12/10/2023 MRI brain showed No evidence of active disease. No edema or abnormal enhancement   Stage 3b chronic kidney disease (HCC) avoid nephrotoxins. Encourage oral hydration  Transaminitis Likely due to cabozantinib.  Monitor.     Orders Placed This Encounter  Procedures   CBC with Differential (Cancer Center Only)    Standing Status:   Future    Expected Date:   04/14/2023    Expiration Date:   03/16/2024   CMP (Cancer Center only)    Standing Status:   Future    Expected Date:   04/14/2023    Expiration Date:   03/16/2024    Patient is Spanish-speaking.   Spanish interpreter service utilized for entire encounter   Return visit   4 weeks  All questions were answered. The patient knows to call the clinic with any problems, questions or concerns.  [name], MD, PhD [facility] Hematology Oncology 03/17/2023   PERTINENT HISTORY-  63 y.o. male presents for follow-up of  renal cell carcinoma  Oncology History  Renal cell carcinoma of left kidney (HCC)  05/23/2019 Initial Diagnosis   Renal cell carcinoma of left kidney   -05/02/2019-05/07/2019 due to gross hematuria. CT showed a 7.4 cm left lower pole renal mass concerning for RCC.  Patient also has necrotic lymphadenopathy in the mediastinum and the right supra hilar region which were highly suspicious for metastatic disease.  Bilateral lung nodules, also concerning for metastatic lung disease.  Right retrocrural lymphadenopathy with upper normal left periaortic lymph node. Patient was recommended for cytoreductive radical nephrectomy.  With history of ANCA positive vasculitis, also recommend thoracic lymphadenopathy biopsy to confirm distal metastasis.  Patient agreed to radical nephrectomy and declined bronchoscopy biopsy. He underwent left radical nephrectomy on 05/05/2019. Pathology showed pT3a pNx, RCC, conventional clear cell type, grade 3    05/23/2019 Cancer Staging   Staging form: Kidney, AJCC 8th Edition - Pathologic: Stage IV (pT3a, pNX, cM1) - Signed by [name], MD on 05/23/2019   06/07/2019 - 09/25/2021 Chemotherapy   Keytruda + Axitinib.  06/07/19 He started Axitinib 5mg  BID, stopped on 07/25/2020 due to AKI, resumed on 10/04/2019, stopped again on 05/01/2020 due to diarrhea. 09/11/2020 resumed axitinib 5 mg twice daily, stopped on 10/23/2020 due to diarrhea.  01/17/2021 Keytruda was held due to transaminitis, immunotherapy induced liver toxicities. 02/20/2021, resumed Keytruda.  09/25/2021 last dose of Keytruda    01/12/2020 Imaging   CT showed overall marked improvement.  Comparing to CT scan in August 2021, continues to have treatment response.Mediastinal  soft tissue nodule nearly completely resolved, right minor fissure nodule 8 mm, decreased in size.  Resolution of pleural fluid and thickening of the right chest.  Small pleural effusion in the left chest.  No new lung nodules.-Subtle variation of the  muscle architecture of the left psoas muscle of unknown significance   04/30/2020 Imaging   CT chest abdomen pelvis without contrast showed stable disease   11/28/2020 Imaging   CT abdomen pelvis without contrast showed stable examination.  No evidence of new or progressive disease in the chest abdomen pelvis.  Stable to minimally decreased size of 5 mm pulmonary nodule in the minor fissure in the right chest.  Sigmoid colon diverticulosis without findings of acute diverticulitis.   03/08/2021 Imaging   CT scan showed NED   07/31/2021 Imaging   CT chest abdomen pelvis showed stable disease   08/27/2021 Imaging   MRI brain with and without contrast Decrease in size of lesions presumably reflecting neurocysticercosis. No evidence new metastatic disease   08/29/2021 Imaging   CT Chest abdomen pelvis w contrast 1. No findings of active malignancy. 6 by 4 mm right upper lobe nodule along the major fissure is stable and likely represents a previously treated metastatic lesion. 2. Coronary artery atherosclerosis disproportionate to atherosclerosis elsewhere in the chest, abdomen, and pelvis. 3. Mild surgical bronchiectasis in both lower lobes. 4. Mild prostatomegaly. 5. 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 Status post left nephrectomy, without recurrent or metastatic disease. 2. Similar small right-sided pulmonary nodules. 3. New or progressive posterior left upper lobe clustered nodularity and ground-glass, suspicious for minimal infection. 4.  Coronary artery atherosclerosi   09/25/2022 Imaging   CT chest abdomen pelvis w contrast showed CHEST:   1. New nodularity in the LEFT upper lobe with new mediastinal lymphadenopathy. 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 2. No skeletal metastasis.   PELVIS:   1. No evidence of local recurrence in the LEFT nephrectomy bed. 2. No  evidence of metastatic disease in the abdomen pelvis.    10/08/2022 Imaging   PET scan showed  1. Hypermetabolic mediastinal and left hilar lymphadenopathy with hypermetabolic nodularity in the posterior left upper lobe. Imaging features are compatible with metastatic disease. 2. No evidence for metastatic disease in the abdomen or pelvis. 3. Changes of avascular necrosis in the femoral heads bilaterally.    10/10/2022 Relapse/Recurrence   S/p biopsy via bronchoscopy  1. Lung, biopsy, Left upper lobe :       - METASTATIC CLEAR CELL RENAL CELL CARCINOMA   Bronchial Brushing Lung, upper lobe  Fine Needle Aspiration  - POSITIVE FOR MALIGNANCY.       - CONSISTENT WITH METASTATIC CLEAR CELL RENAL CELL CARCINOMA.     12/10/2022 - 02/10/2023 Chemotherapy   cabozantinib 40mg  daily, stopped due diarrhea, poor appetite, bad taste   02/12/2023 Imaging   CT chest abdomen pelvis wo contrast showed  1. Decreased size of the thoracic lymph nodes and left upper lobe nodularity. 2. No evidence of new or progressive metastatic disease in the chest, abdomen or pelvis. 3. Colonic diverticulosis without findings of acute diverticulitis. 4. Avascular necrosis of the femoral heads.   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  Patient has been referred to orthopedic surgeon in [place] which he is not able to  go due to transportation.  He does not have insurance as well.  He is currently asymptomatic.  Monitor.  Patient has past medical history CAD status post PCI to LAD in 2018, on aspirin and Plavix, CHF, history of focal sclerosing/crescentic GN-ANCA positive and CKD stage III. 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 He finished 14 days of albendazole plus praziquantel .   He has had few side effects including poor appetite, hair loss, weight loss, bloody stool, abdominal cramping or  diarrhea. All symptoms have improved since he completed the treatment. 03/16/2020 he finished tapering course of steroids  #Intermittent orthopnea,Pre-existing diastolic CHF, 05/04/2019 LVEF 60 to 65%.   Recommend him to follow-up with cardiology-he is has not able to follow-up with cardiology due to lack of insurance coverage.  # He has frequent urination at night.  Previously tried Cardura for BPH and had to stop due hypotension. 07/2022 S/p EGD and colonoscopy - diverticulosis, non bleeding internal hemorrhoids.  Gastritis.  INTERVAL HISTORY Mark Donaldson is a 63 y.o. male who has above history reviewed by me today presents for follow up visit for metastatic RCC Started on cabozantinib 20mg  daily 2 weeks ago. He tolerates well.     Review of systems- Review of Systems  Constitutional:  Negative for appetite change, chills, fatigue, fever and unexpected weight change.  HENT:   Negative for voice change.   Eyes:  Negative for eye problems and icterus.  Respiratory:  Negative for chest tightness and cough.   Cardiovascular:  Negative for chest pain and leg swelling.  Gastrointestinal:  Negative for abdominal distention, abdominal pain, blood in stool, diarrhea and rectal pain.  Endocrine: Negative for hot flashes.  Genitourinary:  Negative for difficulty urinating, dysuria, frequency and nocturia.   Musculoskeletal:  Positive for arthralgias and back pain.  Skin:  Negative for itching and rash.  Neurological:  Negative for light-headedness and numbness.  Hematological:  Negative for adenopathy. Does not bruise/bleed easily.  Psychiatric/Behavioral:  Negative for confusion.     No Known Allergies  Patient Active Problem List   Diagnosis Date Noted   Renal cell carcinoma of left kidney (HCC) 05/23/2019    Priority: High   Chemotherapy induced diarrhea 02/12/2023    Priority: Medium    Stage 3b chronic kidney disease (HCC) 10/04/2019    Priority: Medium    Essential  hypertension 05/18/2016    Priority: Medium    Rectal bleeding 11/24/2022    Priority: Low   IDA (iron deficiency anemia) 11/21/2021    Priority: Low   Encounter for antineoplastic immunotherapy 07/19/2019    Priority: Low   Encounter for antineoplastic chemotherapy 07/19/2019    Priority: Low   CNS lesion 07/07/2019    Priority: Low   Goals of care, counseling/discussion 05/23/2019    Priority: Low   Need for prophylactic vaccination and inoculation against influenza 11/13/2022   Gastritis without bleeding 07/30/2022   Cough 06/03/2022   Avascular necrosis of bones of both hips (HCC) 06/20/2021   Transaminitis 01/17/2021   History of CHF (congestive heart failure) 05/01/2020   Hyperglycemia 02/28/2020   Brain lesion 10/04/2019   Anemia in stage 3b chronic kidney disease (HCC) 06/07/2019   Renal cell carcinoma (HCC) 05/23/2019   Palliative care encounter    History of vasculitis    Thoracic lymphadenopathy    Lung nodules    CAD S/P percutaneous coronary angioplasty 05/02/2019   Kidney mass 05/02/2019   Hydronephrosis of left kidney 05/02/2019   UTI (urinary  tract infection) 05/02/2019   AKI (acute kidney injury) (HCC) 05/02/2019   Gross hematuria 05/02/2019   Chronic diastolic CHF (congestive heart failure) (HCC) 05/02/2019   Sepsis (HCC) 05/02/2019   Severe sepsis (HCC) 05/02/2019   Preop cardiovascular exam    Hyperlipidemia 05/21/2016   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) 05/21/2016   Prediabetes 05/21/2016   Alcohol abuse 05/18/2016   Vasculitis, 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HCC) 05/18/2016   Shortness of breath 05/17/2016     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HCC)    Avascular necrosis of bones of both hips (HCC)    CAD (coronary artery disease)    Essential hypertension    Gastritis without bleeding    HFrEF (heart failure with reduced ejection fraction) (HCC)    Hyperlipidemia LDL goal <70    Iron deficiency anemia     Ischemic cardiomyopathy    Lung nodules 09/2022   left upper lobe   Mediastinal adenopathy 09/2022   Renal cell carcinoma (HCC) 05/23/2019   Renal disorder    Severe sepsis (HCC)    Stage 3b chronic kidney disease (CKD) (HCC)      Past Surgical History:  Procedure Laterality Date   BIOPSY  07/30/2022   Procedure: BIOPSY;  Surgeon: Midge Minium, MD;  Location: [facility];  Service: Endoscopy;;   COLONOSCOPY WITH PROPOFOL N/A 07/30/2022   Procedure: COLONOSCOPY WITH PROPOFOL;  Surgeon: Midge Minium, MD;  Location: [facility];  Service: Endoscopy;  Laterality: N/A;  SPANISH INTERPRETER   ENDOBRONCHIAL ULTRASOUND Left 10/10/2022   Procedure: ENDOBRONCHIAL ULTRASOUND;  Surgeon: Salena Saner, MD;  Location: [facility];  Service: Pulmonary;  Laterality: Left;   ESOPHAGOGASTRODUODENOSCOPY (EGD) WITH PROPOFOL N/A 07/30/2022   Procedure: ESOPHAGOGASTRODUODENOSCOPY (EGD) WITH PROPOFOL;  Surgeon: Midge Minium, MD;  Location: [facility];  Service: Endoscopy;  Laterality: N/A;   LAPAROSCOPIC NEPHRECTOMY, HAND ASSISTED Left 05/05/2019   Procedure: HAND ASSISTED LAPAROSCOPIC NEPHRECTOMY;  Surgeon: Vanna Scotland, MD;  Location: [facility];  Service: Urology;  Laterality: Left;   LEFT HEART CATH AND CORONARY ANGIOGRAPHY  05/20/2016   LAD lesion s/p PCI with Xience    Social History   Socioeconomic History   Marital status: Married    Spouse name: Yeriel   Number of children: 4   Years of education: Not on file   Highest education level: Not on file  Occupational History   Not on file  Tobacco Use   Smoking status: Never   Smokeless tobacco: Never  Vaping Use   Vaping status: Never Used  Substance and Sexual Activity   Alcohol use: Not Currently   Drug use: Never   Sexual activity: Not on file  Other Topics Concern   Not on file  Social History Narrative   Not on file   Social Drivers of Health   Financial Resource Strain: Not on file  Food Insecurity: No Food  Insecurity (07/14/2022)   [facility]   Hunger Vital Sign    Worried About Running Out of Food in the Last Year: Never true    Ran Out of Food in the Last Year: Never true  Transportation Needs: No Transportation Needs (07/14/2022)   [facility]   [facility] - Transportation    Lack of Transportation (Medical): No    Lack of Transportation (Non-Medical): No  Physical Activity: Not on file  Stress: Not on file  Social Connections: Not on file  Intimate Partner Violence: Not  on file     Family History  Problem Relation Age of Onset   Cancer Mother    Blindness Brother      Current Outpatient Medications:    albuterol (VENTOLIN HFA) 108 (90 Base) MCG/ACT inhaler, Inhale 2 puffs into the lungs every 6 (six) hours as needed for wheezing or shortness of breath., Disp: 6.7 g, Rfl: 2   amLODipine (NORVASC) 5 MG tablet, Take 5 mg by mouth daily., Disp: , Rfl:    aspirin 81 MG EC tablet, Take 81 mg by mouth daily. Swallow whole., Disp: , Rfl:    atorvastatin (LIPITOR) 80 MG tablet, Take 80 mg by mouth at bedtime., Disp: , Rfl:    cabozantinib (CABOMETYX) 20 MG tablet, Take 1 tablet (20 mg total) by mouth daily. Take on an empty stomach, 1 hour before or 2 hours after meals., Disp: 30 tablet, Rfl: 2   clopidogrel (PLAVIX) 75 MG tablet, Take 75 mg by mouth daily., Disp: , Rfl:    ferrous sulfate 325 (65 FE) MG tablet, Take 1 tablet (325 mg total) by mouth 2 (two) times daily with a meal., Disp: 180 tablet, Rfl: 2   Fluticasone-Umeclidin-Vilant (TRELEGY ELLIPTA) 100-62.5-25 MCG/ACT AEPB, Inhale 1 puff into the lungs daily., Disp: 60 each, Rfl: 11   furosemide (LASIX) 20 MG tablet, Take 1 tablet (20 mg total) by mouth daily., Disp: 30 tablet, Rfl: 3   losartan (COZAAR) 50 MG tablet, Take 50 mg by mouth 2 (two) times daily., Disp: , Rfl:    metoprolol tartrate (LOPRESSOR) 100 MG tablet, Take 150 mg by mouth daily., Disp: , Rfl:     mirtazapine (REMERON) 15 MG tablet, Take 15 mg by mouth at bedtime., Disp: , Rfl:    Physical exam:  Vitals:   03/17/23 1059  BP: 118/88  Pulse: 69  Resp: 18  Temp: (!) 96.7 F (35.9 C)  TempSrc: Tympanic  SpO2: 97%  Weight: 162 lb 12.8 oz (73.8 kg)    Physical Exam Constitutional:      General: He is not in acute distress.    Appearance: He is not diaphoretic.  HENT:     Head: Normocephalic and atraumatic.  Eyes:     General: No scleral icterus. Cardiovascular:     Rate and Rhythm: Normal rate and regular rhythm.  Pulmonary:     Effort: Pulmonary effort is normal. No respiratory distress.  Abdominal:     General: There is no distension.     Palpations: Abdomen is soft.     Tenderness: There is no abdominal tenderness.  Musculoskeletal:        General: Normal range of motion.     Cervical back: Normal range of motion.  Skin:    General: Skin is warm and dry.     Findings: No erythema.  Neurological:     Mental Status: He is alert and oriented to person, place, and time. Mental status is at baseline.     Cranial Nerves: No cranial nerve deficit.     Motor: No abnormal muscle tone.  Psychiatric:        Mood and Affect: Mood and affect normal.       Labs    Latest Ref Rng & Units 03/17/2023   10:40 AM 02/12/2023   11:12 AM 01/15/2023    1:45 PM  CBC  WBC 4.0 - 10.5 K/uL 9.8  8.9  7.1   Hemoglobin 13.0 - 17.0 g/dL 19.1  47.8  29.5   Hematocrit 39.0 -  52.0 % 45.4  44.0  48.4   Platelets 150 - 400 K/uL 149  141  134       Latest Ref Rng & Units 03/17/2023   10:40 AM 02/12/2023   11:11 AM 01/15/2023    1:46 PM  CMP  Glucose 70 - 99 mg/dL 034  742  595   BUN 8 - 23 mg/dL 47  36  37   Creatinine 0.61 - 1.24 mg/dL 6.38  7.56  4.33   Sodium 135 - 145 mmol/L 137  135  139   Potassium 3.5 - 5.1 mmol/L 4.4  4.8  4.3   Chloride 98 - 111 mmol/L 102  101  105   CO2 22 - 32 mmol/L 26  25  25    Calcium 8.9 - 10.3 mg/dL 9.4  8.7  8.8   Total Protein 6.5 - 8.1 g/dL 7.0   6.8  6.9   Total Bilirubin 0.0 - 1.2 mg/dL 0.9  1.1  0.9   Alkaline Phos 38 - 126 U/L 127  88  118   AST 15 - 41 U/L 52  39  55   ALT 0 - 44 U/L 52  37  54       RADIOGRAPHIC STUDIES: I have personally reviewed the radiological images as listed and agreed with the findings in the report. CT CHEST ABDOMEN PELVIS WO CONTRAST Result Date: 02/12/2023 CLINICAL DATA:  Left renal cell carcinoma/follow-up. * Tracking Code: BO *. EXAM: CT CHEST, ABDOMEN AND PELVIS WITHOUT CONTRAST TECHNIQUE: Multidetector CT imaging of the chest, abdomen and pelvis was performed following the standard protocol without IV contrast. RADIATION DOSE REDUCTION: This exam was performed according to the departmental dose-optimization program which includes automated exposure control, adjustment of the mA and/or kV according to patient size and/or use of iterative reconstruction technique. COMPARISON:  Multiple priors including PET-CT and CT chest dated October 08, 2022 as well as CT chest abdomen pelvis September 25, 2022 FINDINGS: CT CHEST FINDINGS Cardiovascular: Normal caliber thoracic aorta. Coronary artery calcifications. Normal size heart. No significant pericardial effusion/thickening. Mediastinum/Nodes: Decreased size size of the mediastinal and left hilar lymph nodes which were hypermetabolic on prior PET-CT. For reference: -prevascular lymph node measures 2.2 cm in short axis on image 26/2, previously 2.5 cm. -left hilar lymph node measures 9 mm in short axis on image 28/2 previously 14 mm. The esophagus is grossly unremarkable. Lungs/Pleura: Decreased nodularity within region of ground-glass and interstitial thickening in the left upper lobe, and was hypermetabolic on prior PET-CT. For reference: -nodule in the paramedian left upper lobe measures 6 mm on image 51/4 previously 13 mm. No new suspicious pulmonary nodules or masses. Musculoskeletal: No aggressive lytic or blastic lesion of bone. CT ABDOMEN PELVIS FINDINGS  Hepatobiliary: Unremarkable noncontrast enhanced appearance of the hepatic parenchyma. Gallbladder is unremarkable. No biliary ductal dilation. Pancreas: No pancreatic ductal dilation or evidence of acute inflammation. Spleen: No splenomegaly. Adrenals/Urinary Tract: Bilateral adrenal glands appear normal. Left kidney surgically absent without new suspicious nodularity in the nephrectomy bed. No right-sided hydronephrosis, nephrolithiasis or contour deforming renal mass. Urinary bladder is unremarkable for degree of distension. Stomach/Bowel: No radiopaque enteric contrast material was administered. Stomach is distended with ingested material and gas without focal wall thickening. No pathologic dilation of small or large bowel. Normal appendix. Colonic diverticulosis without findings of acute diverticulitis. Vascular/Lymphatic: Normal caliber abdominal aorta. Smooth IVC contours. No pathologically enlarged abdominal or pelvic lymph nodes. Reproductive: Enlarged prostate gland. Other: No significant abdominopelvic free fluid.  Musculoskeletal: No aggressive lytic or blastic lesion of bone. Avascular necrosis of the femoral heads. IMPRESSION: 1. Decreased size of the thoracic lymph nodes and left upper lobe nodularity. 2. No evidence of new or progressive metastatic disease in the chest, abdomen or pelvis. 3. Colonic diverticulosis without findings of acute diverticulitis. 4. Avascular necrosis of the femoral heads. Electronically Signed   By: Maudry Mayhew M.D.   On: 02/12/2023 19:24

## 2023-03-19 ENCOUNTER — Other Ambulatory Visit: Payer: Self-pay

## 2023-03-24 ENCOUNTER — Other Ambulatory Visit: Payer: Self-pay

## 2023-04-07 ENCOUNTER — Other Ambulatory Visit: Payer: Self-pay

## 2023-04-07 ENCOUNTER — Other Ambulatory Visit: Payer: Self-pay | Admitting: Gastroenterology

## 2023-04-08 ENCOUNTER — Encounter: Payer: Self-pay | Admitting: Oncology

## 2023-04-08 ENCOUNTER — Other Ambulatory Visit: Payer: Self-pay

## 2023-04-08 MED FILL — Ferrous Sulfate Tab 325 MG (65 MG Elemental Fe): ORAL | 90 days supply | Qty: 180 | Fill #0 | Status: AC

## 2023-04-15 ENCOUNTER — Other Ambulatory Visit: Payer: Self-pay

## 2023-04-15 ENCOUNTER — Encounter: Payer: Self-pay | Admitting: Oncology

## 2023-04-15 ENCOUNTER — Inpatient Hospital Stay (HOSPITAL_BASED_OUTPATIENT_CLINIC_OR_DEPARTMENT_OTHER): Payer: Self-pay | Admitting: Oncology

## 2023-04-15 ENCOUNTER — Inpatient Hospital Stay: Payer: Self-pay | Attending: Oncology

## 2023-04-15 VITALS — BP 151/98 | HR 65 | Temp 96.2°F | Resp 18 | Wt 166.4 lb

## 2023-04-15 DIAGNOSIS — Z87440 Personal history of urinary (tract) infections: Secondary | ICD-10-CM | POA: Insufficient documentation

## 2023-04-15 DIAGNOSIS — Z7902 Long term (current) use of antithrombotics/antiplatelets: Secondary | ICD-10-CM | POA: Insufficient documentation

## 2023-04-15 DIAGNOSIS — D631 Anemia in chronic kidney disease: Secondary | ICD-10-CM | POA: Insufficient documentation

## 2023-04-15 DIAGNOSIS — Z79899 Other long term (current) drug therapy: Secondary | ICD-10-CM | POA: Insufficient documentation

## 2023-04-15 DIAGNOSIS — N1832 Chronic kidney disease, stage 3b: Secondary | ICD-10-CM | POA: Insufficient documentation

## 2023-04-15 DIAGNOSIS — Z85528 Personal history of other malignant neoplasm of kidney: Secondary | ICD-10-CM | POA: Insufficient documentation

## 2023-04-15 DIAGNOSIS — Z7982 Long term (current) use of aspirin: Secondary | ICD-10-CM | POA: Insufficient documentation

## 2023-04-15 DIAGNOSIS — Z905 Acquired absence of kidney: Secondary | ICD-10-CM | POA: Insufficient documentation

## 2023-04-15 DIAGNOSIS — I5042 Chronic combined systolic (congestive) and diastolic (congestive) heart failure: Secondary | ICD-10-CM | POA: Insufficient documentation

## 2023-04-15 DIAGNOSIS — I130 Hypertensive heart and chronic kidney disease with heart failure and stage 1 through stage 4 chronic kidney disease, or unspecified chronic kidney disease: Secondary | ICD-10-CM | POA: Insufficient documentation

## 2023-04-15 DIAGNOSIS — C642 Malignant neoplasm of left kidney, except renal pelvis: Secondary | ICD-10-CM

## 2023-04-15 DIAGNOSIS — R7401 Elevation of levels of liver transaminase levels: Secondary | ICD-10-CM

## 2023-04-15 DIAGNOSIS — I10 Essential (primary) hypertension: Secondary | ICD-10-CM

## 2023-04-15 LAB — CBC WITH DIFFERENTIAL (CANCER CENTER ONLY)
Abs Immature Granulocytes: 0.02 10*3/uL (ref 0.00–0.07)
Basophils Absolute: 0 10*3/uL (ref 0.0–0.1)
Basophils Relative: 1 %
Eosinophils Absolute: 0.3 10*3/uL (ref 0.0–0.5)
Eosinophils Relative: 3 %
HCT: 46.2 % (ref 39.0–52.0)
Hemoglobin: 15.3 g/dL (ref 13.0–17.0)
Immature Granulocytes: 0 %
Lymphocytes Relative: 37 %
Lymphs Abs: 3 10*3/uL (ref 0.7–4.0)
MCH: 31.9 pg (ref 26.0–34.0)
MCHC: 33.1 g/dL (ref 30.0–36.0)
MCV: 96.5 fL (ref 80.0–100.0)
Monocytes Absolute: 0.9 10*3/uL (ref 0.1–1.0)
Monocytes Relative: 11 %
Neutro Abs: 4 10*3/uL (ref 1.7–7.7)
Neutrophils Relative %: 48 %
Platelet Count: 124 10*3/uL — ABNORMAL LOW (ref 150–400)
RBC: 4.79 MIL/uL (ref 4.22–5.81)
RDW: 13.2 % (ref 11.5–15.5)
WBC Count: 8.2 10*3/uL (ref 4.0–10.5)
nRBC: 0 % (ref 0.0–0.2)

## 2023-04-15 LAB — CMP (CANCER CENTER ONLY)
ALT: 48 U/L — ABNORMAL HIGH (ref 0–44)
AST: 47 U/L — ABNORMAL HIGH (ref 15–41)
Albumin: 4 g/dL (ref 3.5–5.0)
Alkaline Phosphatase: 110 U/L (ref 38–126)
Anion gap: 8 (ref 5–15)
BUN: 44 mg/dL — ABNORMAL HIGH (ref 8–23)
CO2: 26 mmol/L (ref 22–32)
Calcium: 9.1 mg/dL (ref 8.9–10.3)
Chloride: 103 mmol/L (ref 98–111)
Creatinine: 1.78 mg/dL — ABNORMAL HIGH (ref 0.61–1.24)
GFR, Estimated: 43 mL/min — ABNORMAL LOW (ref >60)
Glucose, Bld: 103 mg/dL — ABNORMAL HIGH (ref 70–99)
Potassium: 4.7 mmol/L (ref 3.5–5.1)
Sodium: 137 mmol/L (ref 135–145)
Total Bilirubin: 1 mg/dL (ref 0.0–1.2)
Total Protein: 6.8 g/dL (ref 6.5–8.1)

## 2023-04-15 NOTE — Progress Notes (Signed)
 Pt here for follow up. He states that he has been taking Cabometyx and has been tolerating well.

## 2023-04-15 NOTE — Assessment & Plan Note (Addendum)
 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  S/p 34 cycles of Keytruda [ about 2 years's treatment] - stopped in Sept 2023 --> 09/2022 Biopsy via bronchoscopy pathology showed disease recurrence, metastatic clear cell carcinoma  He did not tolerate Carbozantinib 60mg  or 40mg  daily, currently on second dose reduction of Carbozantinib 20mg  daily Labs are reviewed and discussed with patient. So far he tolerates well and continue

## 2023-04-15 NOTE — Assessment & Plan Note (Signed)
 avoid nephrotoxins. Encourage oral hydration

## 2023-04-15 NOTE — Assessment & Plan Note (Signed)
 due to Carbozantinib, better controlled.  He is on Losartan 50mg  BID, Amlodipine 5mg  daily, metoprolol and lasix.

## 2023-04-15 NOTE — Progress Notes (Signed)
 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  S/p 34 cycles of Keytruda [ about 2 years's treatment] - stopped in Sept 2023 --> 09/2022 Biopsy via bronchoscopy pathology showed disease recurrence, metastatic clear cell carcinoma  He did not tolerate Carbozantinib 60mg  or 40mg  daily, currently on second dose reduction of Carbozantinib 20mg  daily Labs are reviewed and discussed with patient. So far he tolerates well and continue  Essential hypertension due to Carbozantinib, better controlled.  He is on Losartan 50mg  BID, Amlodipine 5mg  daily, metoprolol and lasix.    Stage 3b chronic kidney disease (HCC) avoid nephrotoxins. Encourage oral hydration  Transaminitis Likely due to cabozantinib.  Monitor.     Orders Placed This Encounter  Procedures   CBC with Differential (Cancer Center Only)    Standing Status:   Future    Expected Date:   05/15/2023    Expiration Date:   04/14/2024   CMP (Cancer Center only)    Standing Status:   Future    Expected Date:   05/15/2023    Expiration Date:   04/14/2024   TSH    Standing Status:   Future    Expected Date:   05/15/2023    Expiration Date:   04/14/2024    Patient is Spanish-speaking.   Spanish interpreter service utilized for entire encounter   Return visit   4 weeks  All questions were answered. The patient knows to call the clinic with any problems, questions or concerns.  [name], MD, PhD [facility] Hematology Oncology 04/15/2023   PERTINENT HISTORY-  63 y.o. male presents for follow-up of renal cell  carcinoma  Oncology History  Renal cell carcinoma of left kidney (HCC)  05/23/2019 Initial Diagnosis   Renal cell carcinoma of left kidney   -05/02/2019-05/07/2019 due to gross hematuria. CT showed a 7.4 cm left lower pole renal mass concerning for RCC.  Patient also has necrotic lymphadenopathy in the mediastinum and the right supra hilar region which were highly suspicious for metastatic disease.  Bilateral lung nodules, also concerning for metastatic lung disease.  Right retrocrural lymphadenopathy with upper normal left periaortic lymph node. Patient was recommended for cytoreductive radical nephrectomy.  With history of ANCA positive vasculitis, also recommend thoracic lymphadenopathy biopsy to confirm distal metastasis.  Patient agreed to radical nephrectomy and declined bronchoscopy biopsy. He underwent left radical nephrectomy on 05/05/2019. Pathology showed pT3a pNx, RCC, conventional clear cell type, grade 3    05/23/2019 Cancer Staging   Staging form: Kidney, AJCC 8th Edition - Pathologic: Stage IV (pT3a, pNX, cM1) - Signed by [name], MD on 05/23/2019   06/07/2019 - 09/25/2021 Chemotherapy   Keytruda + Axitinib.  06/07/19 He started Axitinib 5mg  BID, stopped on 07/25/2020 due to AKI, resumed on 10/04/2019, stopped again on 05/01/2020 due to diarrhea. 09/11/2020 resumed axitinib 5 mg twice daily, stopped on 10/23/2020 due to diarrhea.  01/17/2021 Keytruda was held due to transaminitis, immunotherapy induced liver toxicities. 02/20/2021, resumed Keytruda.  09/25/2021 last dose of Keytruda    01/12/2020 Imaging   CT showed overall marked improvement.  Comparing to CT scan in August 2021, continues to have treatment response.Mediastinal soft tissue nodule nearly completely resolved, right minor fissure nodule 8 mm, decreased in size.  Resolution of pleural fluid and thickening of the right chest.  Small pleural effusion in the left chest.  No new lung nodules.-Subtle variation of the muscle  architecture of the left psoas muscle of unknown significance   04/30/2020 Imaging   CT chest abdomen pelvis without contrast showed stable disease   11/28/2020 Imaging   CT abdomen pelvis without contrast showed stable examination.  No evidence of new or progressive disease in the chest abdomen pelvis.  Stable to minimally decreased size of 5 mm pulmonary nodule in the minor fissure in the right chest.  Sigmoid colon diverticulosis without findings of acute diverticulitis.   03/08/2021 Imaging   CT scan showed NED   07/31/2021 Imaging   CT chest abdomen pelvis showed stable disease   08/27/2021 Imaging   MRI brain with and without contrast Decrease in size of lesions presumably reflecting neurocysticercosis. No evidence new metastatic disease   08/29/2021 Imaging   CT Chest abdomen pelvis w contrast 1. No findings of active malignancy. 6 by 4 mm right upper lobe nodule along the major fissure is stable and likely represents a previously treated metastatic lesion. 2. Coronary artery atherosclerosis disproportionate to atherosclerosis elsewhere in the chest, abdomen, and pelvis. 3. Mild surgical bronchiectasis in both lower lobes. 4. Mild prostatomegaly. 5. 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 Status post left nephrectomy, without recurrent or metastatic disease. 2. Similar small right-sided pulmonary nodules. 3. New or progressive posterior left upper lobe clustered nodularity and ground-glass, suspicious for minimal infection. 4.  Coronary artery atherosclerosi   09/25/2022 Imaging   CT chest abdomen pelvis w contrast showed CHEST:   1. New nodularity in the LEFT upper lobe with new mediastinal lymphadenopathy. 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 2. No skeletal metastasis.   PELVIS:   1. No evidence of local recurrence in the LEFT nephrectomy bed. 2. No  evidence of metastatic disease in the abdomen pelvis.    10/08/2022 Imaging   PET scan showed  1. Hypermetabolic mediastinal and left hilar lymphadenopathy with hypermetabolic nodularity in the posterior left upper lobe. Imaging features are compatible with metastatic disease. 2. No evidence for metastatic disease in the abdomen or pelvis. 3. Changes of avascular necrosis in the femoral heads bilaterally.    10/10/2022 Relapse/Recurrence   S/p biopsy via bronchoscopy  1. Lung, biopsy, Left upper lobe :       - METASTATIC CLEAR CELL RENAL CELL CARCINOMA   Bronchial Brushing Lung, upper lobe  Fine Needle Aspiration  - POSITIVE FOR MALIGNANCY.       - CONSISTENT WITH METASTATIC CLEAR CELL RENAL CELL CARCINOMA.     12/10/2022 - 02/10/2023 Chemotherapy   cabozantinib 40mg  daily, stopped due diarrhea, poor appetite, bad taste   12/10/2022 - 02/10/2023 Chemotherapy   cabozantinib 40mg , stopped due to diarrhea, decreased appetite, blurry vision.    02/12/2023 Imaging   CT chest abdomen pelvis wo contrast showed  1. Decreased size of the thoracic lymph nodes and left upper lobe nodularity. 2. No evidence of new or progressive metastatic disease in the chest, abdomen or pelvis. 3. Colonic diverticulosis without findings of acute diverticulitis. 4. Avascular necrosis of the femoral heads.   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  Patient has been referred to orthopedic surgeon in [place] which he is not able to go due to transportation.  He does not have insurance as well.  He is currently asymptomatic.  Monitor.  Patient has past medical history CAD status post PCI to LAD in 2018, on aspirin and Plavix, CHF, history of focal sclerosing/crescentic GN-ANCA positive and CKD stage III. 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 He finished 14 days of albendazole plus  praziquantel .   He has had few side effects including poor appetite, hair loss, weight loss, bloody stool, abdominal cramping or diarrhea. All symptoms have improved since he completed the treatment. 03/16/2020 he finished tapering course of steroids  #Intermittent orthopnea,Pre-existing diastolic CHF, 05/04/2019 LVEF 60 to 65%.   Recommend him to follow-up with cardiology-he is has not able to follow-up with cardiology due to lack of insurance coverage.  # He has frequent urination at night.  Previously tried Cardura for BPH and had to stop due hypotension. 07/2022 S/p EGD and colonoscopy - diverticulosis, non bleeding internal hemorrhoids.  Gastritis.  INTERVAL HISTORY Mark Donaldson is a 63 y.o. male who has above history reviewed by me today presents for follow up visit for metastatic RCC He tolerates cabozantinib 20mg  daily  good appetite, denies nausea vomiting diarrhea.     Review of systems- Review of Systems  Constitutional:  Negative for appetite change, chills, fatigue, fever and unexpected weight change.  HENT:   Negative for voice change.   Eyes:  Negative for eye problems and icterus.  Respiratory:  Negative for chest tightness and cough.   Cardiovascular:  Negative for chest pain and leg swelling.  Gastrointestinal:  Negative for abdominal distention, abdominal pain, blood in stool, diarrhea and rectal pain.  Endocrine: Negative for hot flashes.  Genitourinary:  Negative for difficulty urinating, dysuria, frequency and nocturia.   Musculoskeletal:  Positive for arthralgias and back pain.  Skin:  Negative for itching and rash.  Neurological:  Negative for light-headedness and numbness.  Hematological:  Negative for adenopathy. Does not bruise/bleed easily.  Psychiatric/Behavioral:  Negative for confusion.     No Known Allergies  Patient Active Problem List   Diagnosis Date Noted   Renal cell carcinoma of left kidney (HCC) 05/23/2019    Priority: High    Transaminitis 01/17/2021    Priority: Medium    Stage 3b chronic kidney disease (HCC) 10/04/2019    Priority: Medium    Essential hypertension 05/18/2016    Priority: Medium    Chemotherapy induced diarrhea 02/12/2023    Priority: Low   Rectal bleeding 11/24/2022    Priority: Low   IDA (iron deficiency anemia) 11/21/2021    Priority: Low   Encounter for antineoplastic immunotherapy 07/19/2019    Priority: Low   Encounter for antineoplastic chemotherapy 07/19/2019    Priority: Low   CNS lesion 07/07/2019    Priority: Low   Goals of care, counseling/discussion 05/23/2019    Priority: Low   Need for prophylactic vaccination and inoculation against influenza 11/13/2022   Gastritis without bleeding 07/30/2022   Cough 06/03/2022   Avascular necrosis of bones of both hips (HCC) 06/20/2021   History of CHF (congestive heart failure) 05/01/2020   Hyperglycemia 02/28/2020   Brain lesion 10/04/2019   Anemia in stage 3b chronic kidney disease (HCC) 06/07/2019   Renal cell carcinoma (HCC) 05/23/2019   Palliative care encounter    History of vasculitis  Thoracic lymphadenopathy    Lung nodules    CAD S/P percutaneous coronary angioplasty 05/02/2019   Kidney mass 05/02/2019   Hydronephrosis of left kidney 05/02/2019   UTI (urinary tract infection) 05/02/2019   AKI (acute kidney injury) (HCC) 05/02/2019   Gross hematuria 05/02/2019   Chronic diastolic CHF (congestive heart failure) (HCC) 05/02/2019   Sepsis (HCC) 05/02/2019   Severe sepsis (HCC) 05/02/2019   Preop cardiovascular exam    Hyperlipidemia 05/21/2016   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) 05/21/2016   Prediabetes 05/21/2016   Alcohol abuse 05/18/2016   Vasculitis, 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HCC) 05/18/2016   Shortness of breath 05/17/2016     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HCC)    Avascular necrosis of bones of both hips (HCC)    CAD (coronary artery disease)     Essential hypertension    Gastritis without bleeding    HFrEF (heart failure with reduced ejection fraction) (HCC)    Hyperlipidemia LDL goal <70    Iron deficiency anemia    Ischemic cardiomyopathy    Lung nodules 09/2022   left upper lobe   Mediastinal adenopathy 09/2022   Renal cell carcinoma (HCC) 05/23/2019   Renal disorder    Severe sepsis (HCC)    Stage 3b chronic kidney disease (CKD) (HCC)      Past Surgical History:  Procedure Laterality Date   BIOPSY  07/30/2022   Procedure: BIOPSY;  Surgeon: Midge Minium, MD;  Location: [facility];  Service: Endoscopy;;   COLONOSCOPY WITH PROPOFOL N/A 07/30/2022   Procedure: COLONOSCOPY WITH PROPOFOL;  Surgeon: Midge Minium, MD;  Location: [facility];  Service: Endoscopy;  Laterality: N/A;  SPANISH INTERPRETER   ENDOBRONCHIAL ULTRASOUND Left 10/10/2022   Procedure: ENDOBRONCHIAL ULTRASOUND;  Surgeon: Salena Saner, MD;  Location: [facility] ORS;  Service: Pulmonary;  Laterality: Left;   ESOPHAGOGASTRODUODENOSCOPY (EGD) WITH PROPOFOL N/A 07/30/2022   Procedure: ESOPHAGOGASTRODUODENOSCOPY (EGD) WITH PROPOFOL;  Surgeon: Midge Minium, MD;  Location: [facility];  Service: Endoscopy;  Laterality: N/A;   LAPAROSCOPIC NEPHRECTOMY, HAND ASSISTED Left 05/05/2019   Procedure: HAND ASSISTED LAPAROSCOPIC NEPHRECTOMY;  Surgeon: Vanna Scotland, MD;  Location: [facility] ORS;  Service: Urology;  Laterality: Left;   LEFT HEART CATH AND CORONARY ANGIOGRAPHY  05/20/2016   LAD lesion s/p PCI with Xience    Social History   Socioeconomic History   Marital status: Married    Spouse name: Beatrice   Number of children: 4   Years of education: Not on file   Highest education level: Not on file  Occupational History   Not on file  Tobacco Use   Smoking status: Never   Smokeless tobacco: Never  Vaping Use   Vaping status: Never Used  Substance and Sexual Activity   Alcohol use: Not Currently   Drug use: Never   Sexual activity: Not on file   Other Topics Concern   Not on file  Social History Narrative   Not on file   Social Drivers of Health   Financial Resource Strain: Not on file  Food Insecurity: No Food Insecurity (07/14/2022)   [facility]   Hunger Vital Sign    Worried About Running Out of Food in the Last Year: Never true    Ran Out of Food in the Last Year: Never true  Transportation Needs: No Transportation Needs (07/14/2022)   [facility]   [facility] - Transportation    Lack of Transportation (  Medical): No    Lack of Transportation (Non-Medical): No  Physical Activity: Not on file  Stress: Not on file  Social Connections: Not on file  Intimate Partner Violence: Not on file     Family History  Problem Relation Age of Onset   Cancer Mother    Blindness Brother      Current Outpatient Medications:    albuterol (VENTOLIN HFA) 108 (90 Base) MCG/ACT inhaler, Inhale 2 puffs into the lungs every 6 (six) hours as needed for wheezing or shortness of breath., Disp: 6.7 g, Rfl: 2   amLODipine (NORVASC) 5 MG tablet, Take 5 mg by mouth daily., Disp: , Rfl:    aspirin 81 MG EC tablet, Take 81 mg by mouth daily. Swallow whole., Disp: , Rfl:    atorvastatin (LIPITOR) 80 MG tablet, Take 80 mg by mouth at bedtime., Disp: , Rfl:    cabozantinib (CABOMETYX) 20 MG tablet, Take 1 tablet (20 mg total) by mouth daily. Take on an empty stomach, 1 hour before or 2 hours after meals., Disp: 30 tablet, Rfl: 2   clopidogrel (PLAVIX) 75 MG tablet, Take 75 mg by mouth daily., Disp: , Rfl:    ferrous sulfate (FEROSUL) 325 (65 FE) MG tablet, Take 1 tablet (325 mg total) by mouth 2 (two) times daily with a meal., Disp: 180 tablet, Rfl: 0   Fluticasone-Umeclidin-Vilant (TRELEGY ELLIPTA) 100-62.5-25 MCG/ACT AEPB, Inhale 1 puff into the lungs daily., Disp: 60 each, Rfl: 11   furosemide (LASIX) 20 MG tablet, Take 1 tablet (20 mg total) by mouth daily., Disp: 30 tablet,  Rfl: 3   losartan (COZAAR) 50 MG tablet, Take 50 mg by mouth 2 (two) times daily., Disp: , Rfl:    metoprolol tartrate (LOPRESSOR) 100 MG tablet, Take 150 mg by mouth daily., Disp: , Rfl:    mirtazapine (REMERON) 15 MG tablet, Take 15 mg by mouth at bedtime., Disp: , Rfl:    Physical exam:  Vitals:   04/15/23 1024 04/15/23 1026  BP: (!) 155/105 (!) 151/98  Pulse: 65   Resp: 18   Temp: (!) 96.2 F (35.7 C)   SpO2: 99%   Weight: 166 lb 6.4 oz (75.5 kg)     Physical Exam Constitutional:      General: He is not in acute distress.    Appearance: He is not diaphoretic.  HENT:     Head: Normocephalic and atraumatic.  Eyes:     General: No scleral icterus. Cardiovascular:     Rate and Rhythm: Normal rate and regular rhythm.  Pulmonary:     Effort: Pulmonary effort is normal. No respiratory distress.  Abdominal:     General: There is no distension.     Palpations: Abdomen is soft.     Tenderness: There is no abdominal tenderness.  Musculoskeletal:        General: Normal range of motion.     Cervical back: Normal range of motion.  Skin:    General: Skin is warm and dry.     Findings: No erythema.  Neurological:     Mental Status: He is alert and oriented to person, place, and time. Mental status is at baseline.     Cranial Nerves: No cranial nerve deficit.     Motor: No abnormal muscle tone.  Psychiatric:        Mood and Affect: Mood and affect normal.       Labs    Latest Ref Rng & Units 04/15/2023   10:13 AM 03/17/2023  10:40 AM 02/12/2023   11:12 AM  CBC  WBC 4.0 - 10.5 K/uL 8.2  9.8  8.9   Hemoglobin 13.0 - 17.0 g/dL 16.1  09.6  04.5   Hematocrit 39.0 - 52.0 % 46.2  45.4  44.0   Platelets 150 - 400 K/uL 124  149  141       Latest Ref Rng & Units 04/15/2023   10:13 AM 03/17/2023   10:40 AM 02/12/2023   11:11 AM  CMP  Glucose 70 - 99 mg/dL 409  811  914   BUN 8 - 23 mg/dL 44  47  36   Creatinine 0.61 - 1.24 mg/dL 7.82  9.56  2.13   Sodium 135 - 145 mmol/L 137   137  135   Potassium 3.5 - 5.1 mmol/L 4.7  4.4  4.8   Chloride 98 - 111 mmol/L 103  102  101   CO2 22 - 32 mmol/L 26  26  25    Calcium 8.9 - 10.3 mg/dL 9.1  9.4  8.7   Total Protein 6.5 - 8.1 g/dL 6.8  7.0  6.8   Total Bilirubin 0.0 - 1.2 mg/dL 1.0  0.9  1.1   Alkaline Phos 38 - 126 U/L 110  127  88   AST 15 - 41 U/L 47  52  39   ALT 0 - 44 U/L 48  52  37       RADIOGRAPHIC STUDIES: I have personally reviewed the radiological images as listed and agreed with the findings in the report. CT CHEST ABDOMEN PELVIS WO CONTRAST Result Date: 02/12/2023 CLINICAL DATA:  Left renal cell carcinoma/follow-up. * Tracking Code: BO *. EXAM: CT CHEST, ABDOMEN AND PELVIS WITHOUT CONTRAST TECHNIQUE: Multidetector CT imaging of the chest, abdomen and pelvis was performed following the standard protocol without IV contrast. RADIATION DOSE REDUCTION: This exam was performed according to the departmental dose-optimization program which includes automated exposure control, adjustment of the mA and/or kV according to patient size and/or use of iterative reconstruction technique. COMPARISON:  Multiple priors including PET-CT and CT chest dated October 08, 2022 as well as CT chest abdomen pelvis September 25, 2022 FINDINGS: CT CHEST FINDINGS Cardiovascular: Normal caliber thoracic aorta. Coronary artery calcifications. Normal size heart. No significant pericardial effusion/thickening. Mediastinum/Nodes: Decreased size size of the mediastinal and left hilar lymph nodes which were hypermetabolic on prior PET-CT. For reference: -prevascular lymph node measures 2.2 cm in short axis on image 26/2, previously 2.5 cm. -left hilar lymph node measures 9 mm in short axis on image 28/2 previously 14 mm. The esophagus is grossly unremarkable. Lungs/Pleura: Decreased nodularity within region of ground-glass and interstitial thickening in the left upper lobe, and was hypermetabolic on prior PET-CT. For reference: -nodule in the  paramedian left upper lobe measures 6 mm on image 51/4 previously 13 mm. No new suspicious pulmonary nodules or masses. Musculoskeletal: No aggressive lytic or blastic lesion of bone. CT ABDOMEN PELVIS FINDINGS Hepatobiliary: Unremarkable noncontrast enhanced appearance of the hepatic parenchyma. Gallbladder is unremarkable. No biliary ductal dilation. Pancreas: No pancreatic ductal dilation or evidence of acute inflammation. Spleen: No splenomegaly. Adrenals/Urinary Tract: Bilateral adrenal glands appear normal. Left kidney surgically absent without new suspicious nodularity in the nephrectomy bed. No right-sided hydronephrosis, nephrolithiasis or contour deforming renal mass. Urinary bladder is unremarkable for degree of distension. Stomach/Bowel: No radiopaque enteric contrast material was administered. Stomach is distended with ingested material and gas without focal wall thickening. No pathologic dilation of small or large  bowel. Normal appendix. Colonic diverticulosis without findings of acute diverticulitis. Vascular/Lymphatic: Normal caliber abdominal aorta. Smooth IVC contours. No pathologically enlarged abdominal or pelvic lymph nodes. Reproductive: Enlarged prostate gland. Other: No significant abdominopelvic free fluid. Musculoskeletal: No aggressive lytic or blastic lesion of bone. Avascular necrosis of the femoral heads. IMPRESSION: 1. Decreased size of the thoracic lymph nodes and left upper lobe nodularity. 2. No evidence of new or progressive metastatic disease in the chest, abdomen or pelvis. 3. Colonic diverticulosis without findings of acute diverticulitis. 4. Avascular necrosis of the femoral heads. Electronically Signed   By: Maudry Mayhew M.D.   On: 02/12/2023 19:24

## 2023-04-15 NOTE — Assessment & Plan Note (Signed)
 Likely due to cabozantinib.  Monitor.

## 2023-04-29 ENCOUNTER — Ambulatory Visit: Payer: Self-pay | Admitting: Pulmonary Disease

## 2023-04-29 ENCOUNTER — Encounter: Payer: Self-pay | Admitting: Pulmonary Disease

## 2023-04-29 VITALS — BP 120/92 | HR 73 | Temp 97.6°F | Ht 66.0 in | Wt 164.0 lb

## 2023-04-29 DIAGNOSIS — J4540 Moderate persistent asthma, uncomplicated: Secondary | ICD-10-CM

## 2023-04-29 DIAGNOSIS — I7782 Antineutrophilic cytoplasmic antibody (ANCA) vasculitis: Secondary | ICD-10-CM

## 2023-04-29 DIAGNOSIS — C642 Malignant neoplasm of left kidney, except renal pelvis: Secondary | ICD-10-CM

## 2023-04-29 NOTE — Patient Instructions (Addendum)
 VISIT SUMMARY:  Today, you came in for a follow-up appointment to check on your asthma and cancer treatment. You reported good respiratory function and that you have not needed your emergency inhaler recently. You are currently taking Trelegy once daily for asthma and cabozatinib 20 mg for cancer. You mentioned that your son is recovering from a cardiac event.  YOUR PLAN:  -ASTHMA: Asthma is a condition where your airways narrow and swell, making it difficult to breathe. Your asthma is well-controlled with your current treatment of Trelegy once daily. You have not needed your emergency inhaler recently, and your lungs are clear. Continue taking Trelegy once daily and follow up in 6 months.  -CANCER: Cancer is a disease where abnormal cells divide uncontrollably and destroy body tissue. You are currently undergoing treatment with cabozatinib 20 mg, which you have been taking for almost two months. The medication is effective, and you are tolerating it well with some mild side effects like occasional diarrhea and changes in appetite. Continue taking cabozatinib 20 mg as prescribed by your oncologist.  INSTRUCTIONS:  Please continue taking your medications as prescribed. Follow up in 6 months for your asthma. If you experience any new or worsening symptoms, contact your healthcare provider immediately.  RESUMEN DE LA VISITA:  Hoy acudi a una cita de seguimiento para revisar su tratamiento contra el asma y Management consultant. Report buena funcin respiratoria y que recientemente no ha necesitado su Armed forces operational officer de Associate Professor. Actualmente toma Trelegy una vez al da para el asma y cabozatinib 20 mg para el cncer renal. Mencion que su hijo se est recuperando de un evento cardaco.  SU PLAN:  -ASMA: El asma es una afeccin en la que las vas respiratorias se estrechan e inflaman, lo que dificulta la respiracin. Su asma est bien controlada con su tratamiento actual de Trelegy una vez al da. Recientemente no  ha necesitado Education administrator de emergencia y sus pulmones estn despejados. Contine tomando Trelegy una vez al da y realice una cita de seguimiento en 6 meses.  -CNCER: El cncer es una enfermedad en la que las clulas anormales se dividen sin control y destruyen el tejido Personal assistant. Actualmente est en tratamiento con cabozatinib 20 mg, que ha estado tomando [employer]. El medicamento es Engineer, manufacturing y lo tolera bien, con algunos efectos secundarios leves como diarrea ocasional y cambios en el apetito. Contine tomando cabozatinib 20 mg segn lo prescrito por su onclogo.  INSTRUCCIONES:  Contine tomando sus medicamentos segn lo prescrito. Realice un seguimiento de su asma en 6 meses. Si experimenta sntomas nuevos o que empeoran, contacte a su profesional de la salud inmediatamente.

## 2023-04-29 NOTE — Progress Notes (Signed)
 Subjective:    Patient ID: Mark Donaldson, male    DOB: Mar 16, 1960, 63 y.o.   MRN: [id number]  Patient Care Team: Center, [facility] as PCP - General (General Practice) [name], MD as Consulting Physician (Oncology)  Chief Complaint  Patient presents with   Follow-up    No breathing problems.     BACKGROUND/INTERVAL: 63 year old lifelong never smoker with very complex medical history as noted below who presents for follow-up.  He was last seen on 02 October 2022.  Sequently had robotic assisted bronchoscopy, and endobronchial ultrasound on 10 October 2022.  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  He is followed by his oncologist (Dr. Cathie Hoops) and is currently on cabozantinib 20 mg daily.  With regards to his respiratory issues these are mostly related to moderate persistent asthma.  He was last seen on 26 December 2022.  *History was obtained directly from the patient communicating in his native language (Spanish) in which I am proficient.  HPI Discussed the use of AI scribe software for clinical note transcription with the patient, who gave verbal consent to proceed.  History of Present Illness   Mark Donaldson is a 63 year old male with asthma who presents for follow-up.  He is currently maintained on Trelegy for asthma, using his inhaler once daily, and carries a rescue inhaler, which he has not needed recently. He reports good respiratory function.  He is undergoing treatment with cabozatinib 20 mg for cancer, which he has been taking for almost two months. This medication does not suppress his appetite as much as previous treatments, which also caused a lack of taste and diarrhea. Currently, he experiences occasional mild diarrhea but manages it with medication.  Review of Systems A 10 point review of systems was performed and it is as noted above otherwise negative.   Patient Active  Problem List   Diagnosis Date Noted   Chemotherapy induced diarrhea 02/12/2023   Rectal bleeding 11/24/2022   Need for prophylactic vaccination and inoculation against influenza 11/13/2022   Gastritis without bleeding 07/30/2022   Cough 06/03/2022   IDA (iron deficiency anemia) 11/21/2021   Avascular necrosis of bones of both hips (HCC) 06/20/2021   Transaminitis 01/17/2021   History of CHF (congestive heart failure) 05/01/2020   Hyperglycemia 02/28/2020   Stage 3b chronic kidney disease (HCC) 10/04/2019   Brain lesion 10/04/2019   Encounter for antineoplastic immunotherapy 07/19/2019   Encounter for antineoplastic chemotherapy 07/19/2019   CNS lesion 07/07/2019   Anemia in stage 3b chronic kidney disease (HCC) 06/07/2019   Goals of care, counseling/discussion 05/23/2019   Renal cell carcinoma of left kidney (HCC) 05/23/2019   Renal cell carcinoma (HCC) 05/23/2019   Palliative care encounter    History of vasculitis    Thoracic lymphadenopathy    Lung nodules    CAD S/P percutaneous coronary angioplasty 05/02/2019   Kidney mass 05/02/2019   Hydronephrosis of left kidney 05/02/2019   UTI (urinary tract infection) 05/02/2019   AKI (acute kidney injury) (HCC) 05/02/2019   Gross hematuria 05/02/2019   Chronic diastolic CHF (congestive heart failure) (HCC) 05/02/2019   Sepsis (HCC) 05/02/2019   Severe sepsis (HCC) 05/02/2019   Preop cardiovascular exam    Hyperlipidemia 05/21/2016   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   amLODipine (NORVASC) 5 MG tablet Take 5 mg by mouth daily.   aspirin 81 MG EC tablet Take 81 mg by mouth daily. Swallow whole.   atorvastatin (LIPITOR) 80 MG tablet Take 80 mg by mouth at bedtime.   cabozantinib (CABOMETYX) 20 MG tablet Take 1 tablet (20 mg total) by mouth daily. Take on an empty stomach, 1 hour before or 2 hours after meals.   clopidogrel (PLAVIX) 75 MG tablet Take 75 mg by mouth daily.   ferrous sulfate (FEROSUL) 325 (65 FE) MG tablet Take 1 tablet (325 mg total) by mouth 2 (two) times daily with a meal.   Fluticasone-Umeclidin-Vilant (TRELEGY ELLIPTA) 100-62.5-25 MCG/ACT AEPB Inhale 1 puff into the lungs daily.   furosemide (LASIX) 20 MG tablet Take 1 tablet (20 mg total) by mouth daily.   losartan (COZAAR) 50 MG tablet Take 50 mg by mouth 2 (two) times daily.   metoprolol tartrate (LOPRESSOR) 100 MG tablet Take 150 mg by mouth daily.   mirtazapine (REMERON) 15 MG tablet Take 15 mg by mouth at bedtime.    Immunization History  Administered Date(s) Administered   Influenza, Seasonal, Injecte, Preservative Fre 11/13/2022   Influenza,inj,Quad PF,6+ Mos 11/02/2019   Pneumococcal Conjugate-13 01/14/2007        Objective:     BP (!) 120/92 (BP Location: Left Arm, Patient Position: Sitting, Cuff Size: Normal)   Pulse 73   Temp 97.6 F (36.4 C) (Temporal)   Ht 5\' 6"  (1.676 m)   Wt 164 lb (74.4 kg)   SpO2 98%   BMI 26.47 kg/m   SpO2: 98 %  GENERAL: Well-developed, well-nourished gentleman, no acute distress.  Fully ambulatory.  No conversational dyspnea. HEAD: Normocephalic, atraumatic.  EYES: Pupils equal, round, reactive to light.  No scleral icterus.  MOUTH: Dentition intact, oral mucosa moist.  No thrush. NECK: Supple. No thyromegaly. Trachea midline. No JVD.  No adenopathy. PULMONARY: Good air entry bilaterally.  No adventitious sounds. CARDIOVASCULAR: S1 and S2. Regular  rate and rhythm.  No rubs, murmurs or gallops heard.   ABDOMEN: Benign. MUSCULOSKELETAL: No joint deformity, no clubbing, no edema.  NEUROLOGIC: No overt focal deficit, no gait disturbance, speech is fluent. SKIN: Intact,warm,dry. PSYCH: Mood and behavior normal.      Assessment & Plan:     ICD-10-CM   1. Moderate persistent asthma without complication  J45.40     2. Vasculitis, 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HCC)  I77.82     3. Renal cell carcinoma of left kidney metastatic to other site [facility])  C64.2      Discussion:    Moderate persistent asthma Asthma is well-controlled with current treatment regimen. He uses Trelegy once daily and has not needed his rescue inhaler. Pulmonary examination reveals clear lungs, indicating effective management of asthma symptoms. - Continue Trelegy once daily - Continue albuterol on an as needed basis - Follow up in 6 months  Renal cell cancer He is undergoing treatment with cabozatinib 20 mg for renal cancer. Recent studies confirm the medication's efficacy, and he is tolerating the treatment well with some side  effects such as occasional diarrhea and changes in appetite. The oncologist has adjusted the dosage to 20 mg, which he has been taking for almost two months. - Continue cabozatinib 20 mg as prescribed by the oncologist      Advised if symptoms do not improve or worsen, to please contact office for sooner follow up or seek emergency care.    I spent 32 minutes of dedicated to the care of this patient on the date of this encounter to include pre-visit review of records, face-to-face time with the patient discussing conditions above, post visit ordering of testing, clinical documentation with the electronic health record, making appropriate referrals as documented, and communicating necessary findings to members of the patients care team.     C. Chloe Counter, MD Advanced Bronchoscopy PCCM [facility]    *This note was generated  using voice recognition software/Dragon and/or AI transcription program.  Despite best efforts to proofread, errors can occur which can change the meaning. Any transcriptional errors that result from this process are unintentional and may not be fully corrected at the time of dictation.

## 2023-05-08 ENCOUNTER — Other Ambulatory Visit: Payer: Self-pay | Admitting: Pharmacist

## 2023-05-08 DIAGNOSIS — C642 Malignant neoplasm of left kidney, except renal pelvis: Secondary | ICD-10-CM

## 2023-05-08 MED ORDER — CABOMETYX 20 MG PO TABS: 20.0000 mg | ORAL_TABLET | Freq: Every day | ORAL | 2 refills | Status: DC

## 2023-05-19 ENCOUNTER — Inpatient Hospital Stay (HOSPITAL_BASED_OUTPATIENT_CLINIC_OR_DEPARTMENT_OTHER): Payer: Self-pay | Admitting: Oncology

## 2023-05-19 ENCOUNTER — Encounter: Payer: Self-pay | Admitting: Oncology

## 2023-05-19 ENCOUNTER — Other Ambulatory Visit: Payer: Self-pay

## 2023-05-19 ENCOUNTER — Inpatient Hospital Stay: Payer: Self-pay | Attending: Oncology

## 2023-05-19 VITALS — BP 129/91 | HR 68 | Temp 97.5°F | Resp 18 | Wt 164.0 lb

## 2023-05-19 DIAGNOSIS — D696 Thrombocytopenia, unspecified: Secondary | ICD-10-CM | POA: Insufficient documentation

## 2023-05-19 DIAGNOSIS — N1832 Chronic kidney disease, stage 3b: Secondary | ICD-10-CM

## 2023-05-19 DIAGNOSIS — C642 Malignant neoplasm of left kidney, except renal pelvis: Secondary | ICD-10-CM

## 2023-05-19 DIAGNOSIS — Z79899 Other long term (current) drug therapy: Secondary | ICD-10-CM | POA: Insufficient documentation

## 2023-05-19 DIAGNOSIS — I10 Essential (primary) hypertension: Secondary | ICD-10-CM

## 2023-05-19 DIAGNOSIS — C7802 Secondary malignant neoplasm of left lung: Secondary | ICD-10-CM | POA: Insufficient documentation

## 2023-05-19 DIAGNOSIS — I129 Hypertensive chronic kidney disease with stage 1 through stage 4 chronic kidney disease, or unspecified chronic kidney disease: Secondary | ICD-10-CM | POA: Insufficient documentation

## 2023-05-19 DIAGNOSIS — Z79620 Long term (current) use of immunosuppressive biologic: Secondary | ICD-10-CM | POA: Insufficient documentation

## 2023-05-19 LAB — CMP (CANCER CENTER ONLY)
ALT: 44 U/L (ref 0–44)
AST: 48 U/L — ABNORMAL HIGH (ref 15–41)
Albumin: 4 g/dL (ref 3.5–5.0)
Alkaline Phosphatase: 116 U/L (ref 38–126)
Anion gap: 10 (ref 5–15)
BUN: 34 mg/dL — ABNORMAL HIGH (ref 8–23)
CO2: 22 mmol/L (ref 22–32)
Calcium: 9.1 mg/dL (ref 8.9–10.3)
Chloride: 106 mmol/L (ref 98–111)
Creatinine: 1.73 mg/dL — ABNORMAL HIGH (ref 0.61–1.24)
GFR, Estimated: 44 mL/min — ABNORMAL LOW (ref >60)
Glucose, Bld: 101 mg/dL — ABNORMAL HIGH (ref 70–99)
Potassium: 4.6 mmol/L (ref 3.5–5.1)
Sodium: 138 mmol/L (ref 135–145)
Total Bilirubin: 0.8 mg/dL (ref 0.0–1.2)
Total Protein: 6.8 g/dL (ref 6.5–8.1)

## 2023-05-19 LAB — CBC WITH DIFFERENTIAL (CANCER CENTER ONLY)
Abs Immature Granulocytes: 0.03 10*3/uL (ref 0.00–0.07)
Basophils Absolute: 0 10*3/uL (ref 0.0–0.1)
Basophils Relative: 0 %
Eosinophils Absolute: 0.2 10*3/uL (ref 0.0–0.5)
Eosinophils Relative: 3 %
HCT: 45.7 % (ref 39.0–52.0)
Hemoglobin: 15.4 g/dL (ref 13.0–17.0)
Immature Granulocytes: 0 %
Lymphocytes Relative: 26 %
Lymphs Abs: 1.9 10*3/uL (ref 0.7–4.0)
MCH: 31.8 pg (ref 26.0–34.0)
MCHC: 33.7 g/dL (ref 30.0–36.0)
MCV: 94.4 fL (ref 80.0–100.0)
Monocytes Absolute: 0.7 10*3/uL (ref 0.1–1.0)
Monocytes Relative: 10 %
Neutro Abs: 4.4 10*3/uL (ref 1.7–7.7)
Neutrophils Relative %: 61 %
Platelet Count: 135 10*3/uL — ABNORMAL LOW (ref 150–400)
RBC: 4.84 MIL/uL (ref 4.22–5.81)
RDW: 13.8 % (ref 11.5–15.5)
WBC Count: 7.2 10*3/uL (ref 4.0–10.5)
nRBC: 0 % (ref 0.0–0.2)

## 2023-05-19 LAB — TSH: TSH: 7.164 u[IU]/mL — ABNORMAL HIGH (ref 0.350–4.500)

## 2023-05-19 LAB — T4, FREE: Free T4: 0.95 ng/dL (ref 0.61–1.12)

## 2023-05-19 NOTE — Progress Notes (Signed)
 Pt here for follow up. Reports that he has occasional diarrhea which improves when he takes antidiarrheal.

## 2023-05-19 NOTE — Assessment & Plan Note (Signed)
 Stable counts.  Monitor.

## 2023-05-19 NOTE — Assessment & Plan Note (Signed)
 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  S/p 34 cycles of Keytruda [ about 2 years's treatment] - stopped in Sept 2023 --> 09/2022 Biopsy via bronchoscopy pathology showed disease recurrence, metastatic clear cell carcinoma  He did not tolerate Carbozantinib 60mg  or 40mg  daily, currently on second dose reduction of Carbozantinib 20mg  daily Labs are reviewed and discussed with patient. So far he tolerates well and continue

## 2023-05-19 NOTE — Assessment & Plan Note (Signed)
 due to Carbozantinib, better controlled.  He is on Losartan 50mg  BID, Amlodipine 5mg  daily, metoprolol and lasix.

## 2023-05-19 NOTE — Progress Notes (Signed)
 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  S/p 34 cycles of Keytruda  [ about 2 years's treatment] - stopped in Sept 2023 --> 09/2022 Biopsy via bronchoscopy pathology showed disease recurrence, metastatic clear cell carcinoma  He did not tolerate Carbozantinib 60mg  or 40mg  daily, currently on second dose reduction of Carbozantinib 20mg  daily Labs are reviewed and discussed with patient. So far he tolerates well and continue  Essential hypertension due to Carbozantinib, better controlled.  He is on Losartan 50mg  BID, Amlodipine  5mg  daily, metoprolol  and lasix .    Stage 3b chronic kidney disease (HCC) avoid nephrotoxins. Encourage oral hydration  Thrombocytopenia (HCC) Stable counts. Monitor     Orders Placed This Encounter  Procedures   CBC with Differential (Cancer Center Only)    Standing Status:   Future    Expected Date:   06/19/2023    Expiration Date:   05/18/2024   CMP (Cancer Center only)    Standing Status:   Future    Expected Date:   06/19/2023    Expiration Date:   05/18/2024   Patient is Spanish-speaking.   Spanish interpreter service utilized for entire encounter   Return visit   4 weeks  All questions were answered. The patient knows to call the clinic with any problems, questions or concerns.  Timmy Forbes, MD, PhD [facility] Hematology Oncology 05/19/2023   PERTINENT HISTORY-  63 y.o. male presents for follow-up of renal cell carcinoma  Oncology History  Renal cell carcinoma of left kidney (HCC)  05/23/2019 Initial Diagnosis   Renal  cell carcinoma of left kidney   -05/02/2019-05/07/2019 due to gross hematuria. CT showed a 7.4 cm left lower pole renal mass concerning for RCC.  Patient also has necrotic lymphadenopathy in the mediastinum and the right supra hilar region which were highly suspicious for metastatic disease.  Bilateral lung nodules, also concerning for metastatic lung disease.  Right retrocrural lymphadenopathy with upper normal left periaortic lymph node. Patient was recommended for cytoreductive radical nephrectomy.  With history of ANCA positive vasculitis, also recommend thoracic lymphadenopathy biopsy to confirm distal metastasis.  Patient agreed to radical nephrectomy and declined bronchoscopy biopsy. He underwent left radical nephrectomy on 05/05/2019. Pathology showed pT3a pNx, RCC, conventional clear cell type, grade 3    05/23/2019 Cancer Staging   Staging form: Kidney, AJCC 8th Edition - Pathologic: Stage IV (pT3a, pNX, cM1) - Signed by Timmy Forbes, MD on 05/23/2019   06/07/2019 - 09/25/2021 Chemotherapy   Keytruda  + Axitinib .  06/07/19 He started Axitinib  5mg  BID, stopped on 07/25/2020 due to AKI, resumed on 10/04/2019, stopped again on 05/01/2020 due to diarrhea. 09/11/2020 resumed axitinib  5 mg twice daily, stopped on 10/23/2020 due to diarrhea.  01/17/2021 Keytruda  was held due to transaminitis, immunotherapy induced liver toxicities. 02/20/2021, resumed Keytruda .  09/25/2021 last dose of Keytruda     01/12/2020 Imaging   CT showed overall marked improvement.  Comparing to CT scan in August 2021, continues to have treatment response.Mediastinal soft tissue nodule nearly completely resolved, right minor fissure nodule 8 mm, decreased in size.  Resolution of  pleural fluid and thickening of the right chest.  Small pleural effusion in the left chest.  No new lung nodules.-Subtle variation of the muscle architecture of the left psoas muscle of unknown significance   04/30/2020 Imaging   CT chest abdomen pelvis without  contrast showed stable disease   11/28/2020 Imaging   CT abdomen pelvis without contrast showed stable examination.  No evidence of new or progressive disease in the chest abdomen pelvis.  Stable to minimally decreased size of 5 mm pulmonary nodule in the minor fissure in the right chest.  Sigmoid colon diverticulosis without findings of acute diverticulitis.   03/08/2021 Imaging   CT scan showed NED   07/31/2021 Imaging   CT chest abdomen pelvis showed stable disease   08/27/2021 Imaging   MRI brain with and without contrast Decrease in size of lesions presumably reflecting neurocysticercosis. No evidence new metastatic disease   08/29/2021 Imaging   CT Chest abdomen pelvis w contrast 1. No findings of active malignancy. 6 by 4 mm right upper lobe nodule along the major fissure is stable and likely represents a previously treated metastatic lesion. 2. Coronary artery atherosclerosis disproportionate to atherosclerosis elsewhere in the chest, abdomen, and pelvis. 3. Mild surgical bronchiectasis in both lower lobes. 4. Mild prostatomegaly. 5. 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 Status post left nephrectomy, without recurrent or metastatic disease. 2. Similar small right-sided pulmonary nodules. 3. New or progressive posterior left upper lobe clustered nodularity and ground-glass, suspicious for minimal infection. 4.  Coronary artery atherosclerosi   09/25/2022 Imaging   CT chest abdomen pelvis w contrast showed CHEST:   1. New nodularity in the LEFT upper lobe with new mediastinal lymphadenopathy. 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 2. No skeletal metastasis.   PELVIS:   1. No evidence of local recurrence in the LEFT nephrectomy bed. 2. No evidence of metastatic disease in the abdomen pelvis.    10/08/2022 Imaging   PET scan showed  1. Hypermetabolic  mediastinal and left hilar lymphadenopathy with hypermetabolic nodularity in the posterior left upper lobe. Imaging features are compatible with metastatic disease. 2. No evidence for metastatic disease in the abdomen or pelvis. 3. Changes of avascular necrosis in the femoral heads bilaterally.    10/10/2022 Relapse/Recurrence   S/p biopsy via bronchoscopy  1. Lung, biopsy, Left upper lobe :       - METASTATIC CLEAR CELL RENAL CELL CARCINOMA   Bronchial Brushing Lung, upper lobe  Fine Needle Aspiration  - POSITIVE FOR MALIGNANCY.       - CONSISTENT WITH METASTATIC CLEAR CELL RENAL CELL CARCINOMA.     12/10/2022 - 02/10/2023 Chemotherapy   cabozantinib 40mg  daily, stopped due diarrhea, poor appetite, bad taste   02/12/2023 Imaging   CT chest abdomen pelvis wo contrast showed  1. Decreased size of the thoracic lymph nodes and left upper lobe nodularity. 2. No evidence of new or progressive metastatic disease in the chest, abdomen or pelvis. 3. Colonic diverticulosis without findings of acute diverticulitis. 4. Avascular necrosis of the femoral heads.   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  Patient has been referred to orthopedic surgeon in [place] which he is not able to go due to transportation.  He does not have insurance as well.  He is currently asymptomatic.  Monitor.  Patient has past medical history CAD status post PCI to LAD in 2018, on aspirin and Plavix, CHF, history of focal sclerosing/crescentic GN-ANCA positive and CKD stage III. 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 He finished 14 days of albendazole  plus praziquantel  .   He has had few side effects including poor appetite, hair loss, weight loss, bloody stool, abdominal cramping or diarrhea. All symptoms have improved since he completed the treatment. 03/16/2020 he finished tapering course of  steroids  #Intermittent orthopnea,Pre-existing diastolic CHF, 05/04/2019 LVEF 60 to 65%.   Recommend him to follow-up with cardiology-he is has not able to follow-up with cardiology due to lack of insurance coverage.  # He has frequent urination at night.  Previously tried Cardura for BPH and had to stop due hypotension. 07/2022 S/p EGD and colonoscopy - diverticulosis, non bleeding internal hemorrhoids.  Gastritis.  INTERVAL HISTORY Mark Donaldson is a 63 y.o. male who has above history reviewed by me today presents for follow up visit for metastatic RCC He tolerates cabozantinib 20mg  daily  good appetite, denies nausea vomiting diarrhea.     Review of systems- Review of Systems  Constitutional:  Negative for appetite change, chills, fatigue, fever and unexpected weight change.  HENT:   Negative for voice change.   Eyes:  Negative for eye problems and icterus.  Respiratory:  Negative for chest tightness and cough.   Cardiovascular:  Negative for chest pain and leg swelling.  Gastrointestinal:  Negative for abdominal distention, abdominal pain, blood in stool, diarrhea and rectal pain.  Endocrine: Negative for hot flashes.  Genitourinary:  Negative for difficulty urinating, dysuria, frequency and nocturia.   Musculoskeletal:  Positive for arthralgias and back pain.  Skin:  Negative for itching and rash.  Neurological:  Negative for light-headedness and numbness.  Hematological:  Negative for adenopathy. Does not bruise/bleed easily.  Psychiatric/Behavioral:  Negative for confusion.     No Known Allergies  Patient Active Problem List   Diagnosis Date Noted   Renal cell carcinoma of left kidney (HCC) 05/23/2019    Priority: High   Thrombocytopenia (HCC) 05/19/2023    Priority: Medium    Stage 3b chronic kidney disease (HCC) 10/04/2019    Priority: Medium    Essential hypertension 05/18/2016    Priority: Medium    Chemotherapy induced diarrhea 02/12/2023    Priority:  Low   Rectal bleeding 11/24/2022    Priority: Low   IDA (iron deficiency anemia) 11/21/2021    Priority: Low   Transaminitis 01/17/2021    Priority: Low   Encounter for antineoplastic immunotherapy 07/19/2019    Priority: Low   Encounter for antineoplastic chemotherapy 07/19/2019    Priority: Low   CNS lesion 07/07/2019    Priority: Low   Goals of care, counseling/discussion 05/23/2019    Priority: Low   Need for prophylactic vaccination and inoculation against influenza 11/13/2022   Gastritis without bleeding 07/30/2022   Cough 06/03/2022   Avascular necrosis of bones of both hips (HCC) 06/20/2021   History of CHF (congestive heart failure) 05/01/2020   Hyperglycemia 02/28/2020   Brain lesion 10/04/2019   Anemia in stage 3b chronic kidney disease (HCC) 06/07/2019   Renal cell carcinoma (HCC) 05/23/2019   Palliative care encounter    History of vasculitis    Thoracic lymphadenopathy    Lung nodules    CAD S/P percutaneous coronary angioplasty 05/02/2019   Kidney mass 05/02/2019   Hydronephrosis of left kidney 05/02/2019   UTI (urinary tract infection)  05/02/2019   AKI (acute kidney injury) (HCC) 05/02/2019   Gross hematuria 05/02/2019   Chronic diastolic CHF (congestive heart failure) (HCC) 05/02/2019   Sepsis (HCC) 05/02/2019   Severe sepsis (HCC) 05/02/2019   Preop cardiovascular exam    Hyperlipidemia 05/21/2016   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) 05/21/2016   Prediabetes 05/21/2016   Alcohol abuse 05/18/2016   Vasculitis, 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HCC) 05/18/2016   Shortness of breath 05/17/2016     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HCC)    Avascular necrosis of bones of both hips (HCC)    CAD (coronary artery disease)    Essential hypertension    Gastritis without bleeding    HFrEF (heart failure with reduced ejection fraction) (HCC)    Hyperlipidemia LDL goal <70    Iron deficiency anemia    Ischemic  cardiomyopathy    Lung nodules 09/2022   left upper lobe   Mediastinal adenopathy 09/2022   Renal cell carcinoma (HCC) 05/23/2019   Renal disorder    Severe sepsis (HCC)    Stage 3b chronic kidney disease (CKD) (HCC)      Past Surgical History:  Procedure Laterality Date   BIOPSY  07/30/2022   Procedure: BIOPSY;  Surgeon: Marnee Sink, MD;  Location: [facility];  Service: Endoscopy;;   COLONOSCOPY WITH PROPOFOL  N/A 07/30/2022   Procedure: COLONOSCOPY WITH PROPOFOL ;  Surgeon: Marnee Sink, MD;  Location: [facility];  Service: Endoscopy;  Laterality: N/A;  SPANISH INTERPRETER   ENDOBRONCHIAL ULTRASOUND Left 10/10/2022   Procedure: ENDOBRONCHIAL ULTRASOUND;  Surgeon: Marc Senior, MD;  Location: [facility];  Service: Pulmonary;  Laterality: Left;   ESOPHAGOGASTRODUODENOSCOPY (EGD) WITH PROPOFOL  N/A 07/30/2022   Procedure: ESOPHAGOGASTRODUODENOSCOPY (EGD) WITH PROPOFOL ;  Surgeon: Marnee Sink, MD;  Location: [facility];  Service: Endoscopy;  Laterality: N/A;   LAPAROSCOPIC NEPHRECTOMY, HAND ASSISTED Left 05/05/2019   Procedure: HAND ASSISTED LAPAROSCOPIC NEPHRECTOMY;  Surgeon: Dustin Gimenez, MD;  Location: [facility];  Service: Urology;  Laterality: Left;   LEFT HEART CATH AND CORONARY ANGIOGRAPHY  05/20/2016   LAD lesion s/p PCI with Xience    Social History   Socioeconomic History   Marital status: Married    Spouse name: Traden   Number of children: 4   Years of education: Not on file   Highest education level: Not on file  Occupational History   Not on file  Tobacco Use   Smoking status: Never   Smokeless tobacco: Never  Vaping Use   Vaping status: Never Used  Substance and Sexual Activity   Alcohol use: Not Currently   Drug use: Never   Sexual activity: Not on file  Other Topics Concern   Not on file  Social History Narrative   Not on file   Social Drivers of Health   Financial Resource Strain: Not on file  Food Insecurity: No Food Insecurity  (07/14/2022)   [facility]   Hunger Vital Sign    Worried About Running Out of Food in the Last Year: Never true    Ran Out of Food in the Last Year: Never true  Transportation Needs: No Transportation Needs (07/14/2022)   [facility]   [facility] - Transportation    Lack of Transportation (Medical): No    Lack of Transportation (Non-Medical): No  Physical Activity: Not on file  Stress: Not on file  Social Connections: Not on file  Intimate Partner Violence: Not on file  Family History  Problem Relation Age of Onset   Cancer Mother    Blindness Brother      Current Outpatient Medications:    albuterol  (VENTOLIN  HFA) 108 (90 Base) MCG/ACT inhaler, Inhale 2 puffs into the lungs every 6 (six) hours as needed for wheezing or shortness of breath., Disp: 6.7 g, Rfl: 2   amLODipine  (NORVASC ) 5 MG tablet, Take 5 mg by mouth daily., Disp: , Rfl:    aspirin 81 MG EC tablet, Take 81 mg by mouth daily. Swallow whole., Disp: , Rfl:    atorvastatin  (LIPITOR ) 80 MG tablet, Take 80 mg by mouth at bedtime., Disp: , Rfl:    cabozantinib (CABOMETYX ) 20 MG tablet, Take 1 tablet (20 mg total) by mouth daily. Take on an empty stomach, 1 hour before or 2 hours after meals., Disp: 30 tablet, Rfl: 2   clopidogrel (PLAVIX) 75 MG tablet, Take 75 mg by mouth daily., Disp: , Rfl:    ferrous sulfate  (FEROSUL) 325 (65 FE) MG tablet, Take 1 tablet (325 mg total) by mouth 2 (two) times daily with a meal., Disp: 180 tablet, Rfl: 0   Fluticasone -Umeclidin-Vilant (TRELEGY ELLIPTA ) 100-62.5-25 MCG/ACT AEPB, Inhale 1 puff into the lungs daily., Disp: 60 each, Rfl: 11   furosemide  (LASIX ) 20 MG tablet, Take 1 tablet (20 mg total) by mouth daily., Disp: 30 tablet, Rfl: 3   losartan (COZAAR) 50 MG tablet, Take 50 mg by mouth 2 (two) times daily., Disp: , Rfl:    metoprolol  tartrate (LOPRESSOR ) 100 MG tablet, Take 150 mg by mouth daily., Disp: , Rfl:     mirtazapine (REMERON) 15 MG tablet, Take 15 mg by mouth at bedtime., Disp: , Rfl:    Physical exam:  Vitals:   05/19/23 1029  BP: (!) 129/91  Pulse: 68  Resp: 18  Temp: (!) 97.5 F (36.4 C)  Weight: 164 lb (74.4 kg)    Physical Exam Constitutional:      General: He is not in acute distress.    Appearance: He is not diaphoretic.  HENT:     Head: Normocephalic and atraumatic.  Eyes:     General: No scleral icterus. Cardiovascular:     Rate and Rhythm: Normal rate and regular rhythm.  Pulmonary:     Effort: Pulmonary effort is normal. No respiratory distress.  Abdominal:     General: There is no distension.     Palpations: Abdomen is soft.     Tenderness: There is no abdominal tenderness.  Musculoskeletal:        General: Normal range of motion.     Cervical back: Normal range of motion.  Skin:    General: Skin is warm and dry.     Findings: No erythema.  Neurological:     Mental Status: He is alert and oriented to person, place, and time. Mental status is at baseline.     Cranial Nerves: No cranial nerve deficit.     Motor: No abnormal muscle tone.  Psychiatric:        Mood and Affect: Mood and affect normal.       Labs    Latest Ref Rng & Units 05/19/2023   10:09 AM 04/15/2023   10:13 AM 03/17/2023   10:40 AM  CBC  WBC 4.0 - 10.5 K/uL 7.2  8.2  9.8   Hemoglobin 13.0 - 17.0 g/dL 10.2  72.5  36.6   Hematocrit 39.0 - 52.0 % 45.7  46.2  45.4   Platelets 150 - 400  K/uL 135  124  149       Latest Ref Rng & Units 05/19/2023   10:09 AM 04/15/2023   10:13 AM 03/17/2023   10:40 AM  CMP  Glucose 70 - 99 mg/dL 161  096  045   BUN 8 - 23 mg/dL 34  44  47   Creatinine 0.61 - 1.24 mg/dL 4.09  8.11  9.14   Sodium 135 - 145 mmol/L 138  137  137   Potassium 3.5 - 5.1 mmol/L 4.6  4.7  4.4   Chloride 98 - 111 mmol/L 106  103  102   CO2 22 - 32 mmol/L 22  26  26    Calcium  8.9 - 10.3 mg/dL 9.1  9.1  9.4   Total Protein 6.5 - 8.1 g/dL 6.8  6.8  7.0   Total Bilirubin 0.0 - 1.2  mg/dL 0.8  1.0  0.9   Alkaline Phos 38 - 126 U/L 116  110  127   AST 15 - 41 U/L 48  47  52   ALT 0 - 44 U/L 44  48  52       RADIOGRAPHIC STUDIES: I have personally reviewed the radiological images as listed and agreed with the findings in the report. No results found.

## 2023-05-19 NOTE — Assessment & Plan Note (Signed)
 avoid nephrotoxins. Encourage oral hydration

## 2023-06-12 ENCOUNTER — Other Ambulatory Visit: Payer: Self-pay

## 2023-06-19 ENCOUNTER — Inpatient Hospital Stay (HOSPITAL_BASED_OUTPATIENT_CLINIC_OR_DEPARTMENT_OTHER): Payer: Self-pay | Admitting: Oncology

## 2023-06-19 ENCOUNTER — Other Ambulatory Visit: Payer: Self-pay

## 2023-06-19 ENCOUNTER — Inpatient Hospital Stay: Payer: Self-pay | Attending: Oncology

## 2023-06-19 ENCOUNTER — Encounter: Payer: Self-pay | Admitting: Oncology

## 2023-06-19 ENCOUNTER — Other Ambulatory Visit: Payer: Self-pay | Admitting: Gastroenterology

## 2023-06-19 VITALS — BP 109/86 | HR 69 | Resp 18 | Ht 66.0 in | Wt 163.0 lb

## 2023-06-19 DIAGNOSIS — D696 Thrombocytopenia, unspecified: Secondary | ICD-10-CM

## 2023-06-19 DIAGNOSIS — C642 Malignant neoplasm of left kidney, except renal pelvis: Secondary | ICD-10-CM

## 2023-06-19 DIAGNOSIS — Z79899 Other long term (current) drug therapy: Secondary | ICD-10-CM | POA: Insufficient documentation

## 2023-06-19 DIAGNOSIS — I130 Hypertensive heart and chronic kidney disease with heart failure and stage 1 through stage 4 chronic kidney disease, or unspecified chronic kidney disease: Secondary | ICD-10-CM | POA: Insufficient documentation

## 2023-06-19 DIAGNOSIS — N1832 Chronic kidney disease, stage 3b: Secondary | ICD-10-CM

## 2023-06-19 DIAGNOSIS — C7802 Secondary malignant neoplasm of left lung: Secondary | ICD-10-CM | POA: Insufficient documentation

## 2023-06-19 DIAGNOSIS — D631 Anemia in chronic kidney disease: Secondary | ICD-10-CM | POA: Insufficient documentation

## 2023-06-19 DIAGNOSIS — Z7982 Long term (current) use of aspirin: Secondary | ICD-10-CM | POA: Insufficient documentation

## 2023-06-19 DIAGNOSIS — I10 Essential (primary) hypertension: Secondary | ICD-10-CM

## 2023-06-19 DIAGNOSIS — Z905 Acquired absence of kidney: Secondary | ICD-10-CM | POA: Insufficient documentation

## 2023-06-19 LAB — CBC WITH DIFFERENTIAL (CANCER CENTER ONLY)
Abs Immature Granulocytes: 0.02 10*3/uL (ref 0.00–0.07)
Basophils Absolute: 0 10*3/uL (ref 0.0–0.1)
Basophils Relative: 0 %
Eosinophils Absolute: 0.2 10*3/uL (ref 0.0–0.5)
Eosinophils Relative: 3 %
HCT: 45 % (ref 39.0–52.0)
Hemoglobin: 15.2 g/dL (ref 13.0–17.0)
Immature Granulocytes: 0 %
Lymphocytes Relative: 33 %
Lymphs Abs: 2.5 10*3/uL (ref 0.7–4.0)
MCH: 31.8 pg (ref 26.0–34.0)
MCHC: 33.8 g/dL (ref 30.0–36.0)
MCV: 94.1 fL (ref 80.0–100.0)
Monocytes Absolute: 0.8 10*3/uL (ref 0.1–1.0)
Monocytes Relative: 10 %
Neutro Abs: 4 10*3/uL (ref 1.7–7.7)
Neutrophils Relative %: 54 %
Platelet Count: 125 10*3/uL — ABNORMAL LOW (ref 150–400)
RBC: 4.78 MIL/uL (ref 4.22–5.81)
RDW: 14.1 % (ref 11.5–15.5)
WBC Count: 7.5 10*3/uL (ref 4.0–10.5)
nRBC: 0 % (ref 0.0–0.2)

## 2023-06-19 LAB — CMP (CANCER CENTER ONLY)
ALT: 34 U/L (ref 0–44)
AST: 43 U/L — ABNORMAL HIGH (ref 15–41)
Albumin: 4 g/dL (ref 3.5–5.0)
Alkaline Phosphatase: 104 U/L (ref 38–126)
Anion gap: 9 (ref 5–15)
BUN: 35 mg/dL — ABNORMAL HIGH (ref 8–23)
CO2: 22 mmol/L (ref 22–32)
Calcium: 8.9 mg/dL (ref 8.9–10.3)
Chloride: 107 mmol/L (ref 98–111)
Creatinine: 1.89 mg/dL — ABNORMAL HIGH (ref 0.61–1.24)
GFR, Estimated: 40 mL/min — ABNORMAL LOW (ref >60)
Glucose, Bld: 107 mg/dL — ABNORMAL HIGH (ref 70–99)
Potassium: 4.2 mmol/L (ref 3.5–5.1)
Sodium: 138 mmol/L (ref 135–145)
Total Bilirubin: 0.9 mg/dL (ref 0.0–1.2)
Total Protein: 6.9 g/dL (ref 6.5–8.1)

## 2023-06-19 NOTE — Progress Notes (Signed)
 Patient reports back pain that comes and goes

## 2023-06-19 NOTE — Progress Notes (Signed)
 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  S/p 34 cycles of Keytruda  [ about 2 years's treatment] - stopped in Sept 2023 --> 09/2022 Biopsy via bronchoscopy pathology showed disease recurrence, metastatic clear cell carcinoma  He did not tolerate Carbozantinib 60mg  or 40mg  daily, currently on second dose reduction of Carbozantinib 20mg  daily Labs are reviewed and discussed with patient. So far he tolerates well and continue current regimen.  Repeat CT   Essential hypertension due to Carbozantinib, better controlled.     Stage 3b chronic kidney disease (HCC) avoid nephrotoxins. Encourage oral hydration  Thrombocytopenia (HCC) Stable counts. Monitor     Orders Placed This Encounter  Procedures   CT Chest Wo Contrast    Standing Status:   Future    Expected Date:   07/10/2023    Expiration Date:   06/18/2024    Preferred imaging location?:   [facility]   CBC with Differential (Cancer Center Only)    Standing Status:   Future    Expected Date:   07/19/2023    Expiration Date:   06/18/2024   CMP (Cancer Center only)    Standing Status:   Future    Expected Date:   07/19/2023    Expiration Date:   06/18/2024   Patient is Spanish-speaking.   Spanish interpreter service utilized for entire encounter   Return visit   4 weeks  All questions were answered. The patient knows to call the clinic with any problems, questions or concerns.  Timmy Forbes, MD, PhD [facility] Hematology Oncology 06/19/2023   PERTINENT HISTORY-  63 y.o. male presents for  follow-up of renal cell carcinoma  Oncology History  Renal cell carcinoma of left kidney (HCC)  05/23/2019 Initial Diagnosis   Renal cell carcinoma of left kidney   -05/02/2019-05/07/2019 due to gross hematuria. CT showed a 7.4 cm left lower pole renal mass concerning for RCC.  Patient also has necrotic lymphadenopathy in the mediastinum and the right supra hilar region which were highly suspicious for metastatic disease.  Bilateral lung nodules, also concerning for metastatic lung disease.  Right retrocrural lymphadenopathy with upper normal left periaortic lymph node. Patient was recommended for cytoreductive radical nephrectomy.  With history of ANCA positive vasculitis, also recommend thoracic lymphadenopathy biopsy to confirm distal metastasis.  Patient agreed to radical nephrectomy and declined bronchoscopy biopsy. He underwent left radical nephrectomy on 05/05/2019. Pathology showed pT3a pNx, RCC, conventional clear cell type, grade 3    05/23/2019 Cancer Staging   Staging form: Kidney, AJCC 8th Edition - Pathologic: Stage IV (pT3a, pNX, cM1) - Signed by Timmy Forbes, MD on 05/23/2019   06/07/2019 - 09/25/2021 Chemotherapy   Keytruda  + Axitinib .  06/07/19 He started Axitinib  5mg  BID, stopped on 07/25/2020 due to AKI, resumed on 10/04/2019, stopped again on 05/01/2020 due to diarrhea. 09/11/2020 resumed axitinib  5 mg twice daily, stopped on 10/23/2020 due to diarrhea.  01/17/2021 Keytruda  was held due to transaminitis, immunotherapy induced liver toxicities. 02/20/2021, resumed Keytruda .  09/25/2021 last dose of Keytruda     01/12/2020 Imaging   CT showed overall  marked improvement.  Comparing to CT scan in August 2021, continues to have treatment response.Mediastinal soft tissue nodule nearly completely resolved, right minor fissure nodule 8 mm, decreased in size.  Resolution of pleural fluid and thickening of the right chest.  Small pleural effusion in the left chest.  No new lung nodules.-Subtle  variation of the muscle architecture of the left psoas muscle of unknown significance   04/30/2020 Imaging   CT chest abdomen pelvis without contrast showed stable disease   11/28/2020 Imaging   CT abdomen pelvis without contrast showed stable examination.  No evidence of new or progressive disease in the chest abdomen pelvis.  Stable to minimally decreased size of 5 mm pulmonary nodule in the minor fissure in the right chest.  Sigmoid colon diverticulosis without findings of acute diverticulitis.   03/08/2021 Imaging   CT scan showed NED   07/31/2021 Imaging   CT chest abdomen pelvis showed stable disease   08/27/2021 Imaging   MRI brain with and without contrast Decrease in size of lesions presumably reflecting neurocysticercosis. No evidence new metastatic disease   08/29/2021 Imaging   CT Chest abdomen pelvis w contrast 1. No findings of active malignancy. 6 by 4 mm right upper lobe nodule along the major fissure is stable and likely represents a previously treated metastatic lesion. 2. Coronary artery atherosclerosis disproportionate to atherosclerosis elsewhere in the chest, abdomen, and pelvis. 3. Mild surgical bronchiectasis in both lower lobes. 4. Mild prostatomegaly. 5. 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 Status post left nephrectomy, without recurrent or metastatic disease. 2. Similar small right-sided pulmonary nodules. 3. New or progressive posterior left upper lobe clustered nodularity and ground-glass, suspicious for minimal infection. 4.  Coronary artery atherosclerosi   09/25/2022 Imaging   CT chest abdomen pelvis w contrast showed CHEST:   1. New nodularity in the LEFT upper lobe with new mediastinal lymphadenopathy. 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 2. No skeletal metastasis.   PELVIS:   1. No evidence of local recurrence in the LEFT  nephrectomy bed. 2. No evidence of metastatic disease in the abdomen pelvis.    10/08/2022 Imaging   PET scan showed  1. Hypermetabolic mediastinal and left hilar lymphadenopathy with hypermetabolic nodularity in the posterior left upper lobe. Imaging features are compatible with metastatic disease. 2. No evidence for metastatic disease in the abdomen or pelvis. 3. Changes of avascular necrosis in the femoral heads bilaterally.    10/10/2022 Relapse/Recurrence   S/p biopsy via bronchoscopy  1. Lung, biopsy, Left upper lobe :       - METASTATIC CLEAR CELL RENAL CELL CARCINOMA   Bronchial Brushing Lung, upper lobe  Fine Needle Aspiration  - POSITIVE FOR MALIGNANCY.       - CONSISTENT WITH METASTATIC CLEAR CELL RENAL CELL CARCINOMA.     12/10/2022 - 02/10/2023 Chemotherapy   cabozantinib 40mg  daily, stopped due diarrhea, poor appetite, bad taste   02/12/2023 Imaging   CT chest abdomen pelvis wo contrast showed  1. Decreased size of the thoracic lymph nodes and left upper lobe nodularity. 2. No evidence of new or progressive metastatic disease in the chest, abdomen or pelvis. 3. Colonic diverticulosis without findings of acute diverticulitis. 4. Avascular necrosis of the femoral heads.   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  Patient has been referred to orthopedic surgeon in [place] which he is not able to go due to transportation.  He does not have insurance as well.  He is currently asymptomatic.  Monitor.  Patient has past medical history CAD status post PCI to LAD in 2018, on aspirin and Plavix, CHF, history of focal sclerosing/crescentic GN-ANCA positive and CKD stage III. 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 He finished 14 days of albendazole  plus praziquantel  .   He has had few side effects including poor appetite, hair loss, weight loss, bloody stool, abdominal  cramping or diarrhea. All symptoms have improved since he completed the treatment. 03/16/2020 he finished tapering course of steroids  #Intermittent orthopnea,Pre-existing diastolic CHF, 05/04/2019 LVEF 60 to 65%.   Recommend him to follow-up with cardiology-he is has not able to follow-up with cardiology due to lack of insurance coverage.  # He has frequent urination at night.  Previously tried Cardura for BPH and had to stop due hypotension. 07/2022 S/p EGD and colonoscopy - diverticulosis, non bleeding internal hemorrhoids.  Gastritis.  INTERVAL HISTORY Mark Donaldson is a 63 y.o. male who has above history reviewed by me today presents for follow up visit for metastatic RCC He tolerates cabozantinib 20mg  daily  good appetite, denies nausea vomiting diarrhea.     Review of systems- Review of Systems  Constitutional:  Negative for appetite change, chills, fatigue, fever and unexpected weight change.  HENT:   Negative for voice change.   Eyes:  Negative for eye problems and icterus.  Respiratory:  Negative for chest tightness and cough.   Cardiovascular:  Negative for chest pain and leg swelling.  Gastrointestinal:  Negative for abdominal distention, abdominal pain, blood in stool, diarrhea and rectal pain.  Endocrine: Negative for hot flashes.  Genitourinary:  Negative for difficulty urinating, dysuria, frequency and nocturia.   Musculoskeletal:  Positive for arthralgias and back pain.  Skin:  Negative for itching and rash.  Neurological:  Negative for light-headedness and numbness.  Hematological:  Negative for adenopathy. Does not bruise/bleed easily.  Psychiatric/Behavioral:  Negative for confusion.     No Known Allergies  Patient Active Problem List   Diagnosis Date Noted   Renal cell carcinoma of left kidney (HCC) 05/23/2019    Priority: High   Thrombocytopenia (HCC) 05/19/2023    Priority: Medium    Stage 3b chronic kidney disease (HCC) 10/04/2019    Priority:  Medium    Essential hypertension 05/18/2016    Priority: Medium    Chemotherapy induced diarrhea 02/12/2023    Priority: Low   Rectal bleeding 11/24/2022    Priority: Low   IDA (iron deficiency anemia) 11/21/2021    Priority: Low   Transaminitis 01/17/2021    Priority: Low   Encounter for antineoplastic immunotherapy 07/19/2019    Priority: Low   Encounter for antineoplastic chemotherapy 07/19/2019    Priority: Low   CNS lesion 07/07/2019    Priority: Low   Goals of care, counseling/discussion 05/23/2019    Priority: Low   Need for prophylactic vaccination and inoculation against influenza 11/13/2022   Gastritis without bleeding 07/30/2022   Cough 06/03/2022   Avascular necrosis of bones of both hips (HCC) 06/20/2021   History of CHF (congestive heart failure) 05/01/2020   Hyperglycemia 02/28/2020   Brain lesion 10/04/2019   Anemia in stage 3b chronic kidney disease (HCC) 06/07/2019   Renal cell carcinoma (HCC) 05/23/2019   Palliative care encounter    History of vasculitis    Thoracic  lymphadenopathy    Lung nodules    CAD S/P percutaneous coronary angioplasty 05/02/2019   Kidney mass 05/02/2019   Hydronephrosis of left kidney 05/02/2019   UTI (urinary tract infection) 05/02/2019   AKI (acute kidney injury) (HCC) 05/02/2019   Gross hematuria 05/02/2019   Chronic diastolic CHF (congestive heart failure) (HCC) 05/02/2019   Sepsis (HCC) 05/02/2019   Severe sepsis (HCC) 05/02/2019   Preop cardiovascular exam    Hyperlipidemia 05/21/2016   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) 05/21/2016   Prediabetes 05/21/2016   Alcohol abuse 05/18/2016   Vasculitis, 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HCC) 05/18/2016   Shortness of breath 05/17/2016     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HCC)    Avascular necrosis of bones of both hips (HCC)    CAD (coronary artery disease)    Essential hypertension    Gastritis without bleeding    HFrEF (heart  failure with reduced ejection fraction) (HCC)    Hyperlipidemia LDL goal <70    Iron deficiency anemia    Ischemic cardiomyopathy    Lung nodules 09/2022   left upper lobe   Mediastinal adenopathy 09/2022   Renal cell carcinoma (HCC) 05/23/2019   Renal disorder    Severe sepsis (HCC)    Stage 3b chronic kidney disease (CKD) (HCC)      Past Surgical History:  Procedure Laterality Date   BIOPSY  07/30/2022   Procedure: BIOPSY;  Surgeon: Marnee Sink, MD;  Location: [facility];  Service: Endoscopy;;   COLONOSCOPY WITH PROPOFOL  N/A 07/30/2022   Procedure: COLONOSCOPY WITH PROPOFOL ;  Surgeon: Marnee Sink, MD;  Location: [facility];  Service: Endoscopy;  Laterality: N/A;  SPANISH INTERPRETER   ENDOBRONCHIAL ULTRASOUND Left 10/10/2022   Procedure: ENDOBRONCHIAL ULTRASOUND;  Surgeon: Marc Senior, MD;  Location: [facility];  Service: Pulmonary;  Laterality: Left;   ESOPHAGOGASTRODUODENOSCOPY (EGD) WITH PROPOFOL  N/A 07/30/2022   Procedure: ESOPHAGOGASTRODUODENOSCOPY (EGD) WITH PROPOFOL ;  Surgeon: Marnee Sink, MD;  Location: [facility];  Service: Endoscopy;  Laterality: N/A;   LAPAROSCOPIC NEPHRECTOMY, HAND ASSISTED Left 05/05/2019   Procedure: HAND ASSISTED LAPAROSCOPIC NEPHRECTOMY;  Surgeon: Dustin Gimenez, MD;  Location: [facility];  Service: Urology;  Laterality: Left;   LEFT HEART CATH AND CORONARY ANGIOGRAPHY  05/20/2016   LAD lesion s/p PCI with Xience    Social History   Socioeconomic History   Marital status: Married    Spouse name: Kashif   Number of children: 4   Years of education: Not on file   Highest education level: Not on file  Occupational History   Not on file  Tobacco Use   Smoking status: Never   Smokeless tobacco: Never  Vaping Use   Vaping status: Never Used  Substance and Sexual Activity   Alcohol use: Not Currently   Drug use: Never   Sexual activity: Not on file  Other Topics Concern   Not on file  Social History Narrative   Not  on file   Social Drivers of Health   Financial Resource Strain: Not on file  Food Insecurity: No Food Insecurity (07/14/2022)   [facility]   Hunger Vital Sign    Worried About Running Out of Food in the Last Year: Never true    Ran Out of Food in the Last Year: Never true  Transportation Needs: No Transportation Needs (07/14/2022)   [facility]   [facility] - Transportation    Lack of Transportation (Medical):  No    Lack of Transportation (Non-Medical): No  Physical Activity: Not on file  Stress: Not on file  Social Connections: Not on file  Intimate Partner Violence: Not on file     Family History  Problem Relation Age of Onset   Cancer Mother    Blindness Brother      Current Outpatient Medications:    albuterol  (VENTOLIN  HFA) 108 (90 Base) MCG/ACT inhaler, Inhale 2 puffs into the lungs every 6 (six) hours as needed for wheezing or shortness of breath., Disp: 6.7 g, Rfl: 2   amLODipine  (NORVASC ) 5 MG tablet, Take 5 mg by mouth daily., Disp: , Rfl:    aspirin 81 MG EC tablet, Take 81 mg by mouth daily. Swallow whole., Disp: , Rfl:    atorvastatin  (LIPITOR ) 80 MG tablet, Take 80 mg by mouth at bedtime., Disp: , Rfl:    cabozantinib (CABOMETYX ) 20 MG tablet, Take 1 tablet (20 mg total) by mouth daily. Take on an empty stomach, 1 hour before or 2 hours after meals., Disp: 30 tablet, Rfl: 2   clopidogrel (PLAVIX) 75 MG tablet, Take 75 mg by mouth daily., Disp: , Rfl:    ferrous sulfate  (FEROSUL) 325 (65 FE) MG tablet, Take 1 tablet (325 mg total) by mouth 2 (two) times daily with a meal., Disp: 180 tablet, Rfl: 0   Fluticasone -Umeclidin-Vilant (TRELEGY ELLIPTA ) 100-62.5-25 MCG/ACT AEPB, Inhale 1 puff into the lungs daily., Disp: 60 each, Rfl: 11   furosemide  (LASIX ) 20 MG tablet, Take 1 tablet (20 mg total) by mouth daily., Disp: 30 tablet, Rfl: 3   losartan (COZAAR) 100 MG tablet, Take 100 mg by mouth daily.,  Disp: , Rfl:    metoprolol  tartrate (LOPRESSOR ) 100 MG tablet, Take 150 mg by mouth daily., Disp: , Rfl:    mirtazapine (REMERON) 15 MG tablet, Take 15 mg by mouth at bedtime., Disp: , Rfl:    losartan (COZAAR) 50 MG tablet, Take 50 mg by mouth 2 (two) times daily. (Patient not taking: Reported on 06/19/2023), Disp: , Rfl:    Physical exam:  Vitals:   06/19/23 1204 06/19/23 1209  BP:  109/86  Pulse:  69  Resp:  18  SpO2:  98%  Weight: 163 lb (73.9 kg)   Height: 5\' 6"  (1.676 m)     Physical Exam Constitutional:      General: He is not in acute distress.    Appearance: He is not diaphoretic.  HENT:     Head: Normocephalic and atraumatic.  Eyes:     General: No scleral icterus. Cardiovascular:     Rate and Rhythm: Normal rate and regular rhythm.  Pulmonary:     Effort: Pulmonary effort is normal. No respiratory distress.  Abdominal:     General: There is no distension.     Palpations: Abdomen is soft.     Tenderness: There is no abdominal tenderness.  Musculoskeletal:        General: Normal range of motion.     Cervical back: Normal range of motion.  Skin:    General: Skin is warm and dry.     Findings: No erythema.  Neurological:     Mental Status: He is alert and oriented to person, place, and time. Mental status is at baseline.     Cranial Nerves: No cranial nerve deficit.     Motor: No abnormal muscle tone.  Psychiatric:        Mood and Affect: Mood and affect normal.  Labs    Latest Ref Rng & Units 06/19/2023   11:37 AM 05/19/2023   10:09 AM 04/15/2023   10:13 AM  CBC  WBC 4.0 - 10.5 K/uL 7.5  7.2  8.2   Hemoglobin 13.0 - 17.0 g/dL 16.1  09.6  04.5   Hematocrit 39.0 - 52.0 % 45.0  45.7  46.2   Platelets 150 - 400 K/uL 125  135  124       Latest Ref Rng & Units 06/19/2023   11:37 AM 05/19/2023   10:09 AM 04/15/2023   10:13 AM  CMP  Glucose 70 - 99 mg/dL 409  811  914   BUN 8 - 23 mg/dL 35  34  44   Creatinine 0.61 - 1.24 mg/dL 7.82  9.56  2.13   Sodium  135 - 145 mmol/L 138  138  137   Potassium 3.5 - 5.1 mmol/L 4.2  4.6  4.7   Chloride 98 - 111 mmol/L 107  106  103   CO2 22 - 32 mmol/L 22  22  26    Calcium  8.9 - 10.3 mg/dL 8.9  9.1  9.1   Total Protein 6.5 - 8.1 g/dL 6.9  6.8  6.8   Total Bilirubin 0.0 - 1.2 mg/dL 0.9  0.8  1.0   Alkaline Phos 38 - 126 U/L 104  116  110   AST 15 - 41 U/L 43  48  47   ALT 0 - 44 U/L 34  44  48       RADIOGRAPHIC STUDIES: I have personally reviewed the radiological images as listed and agreed with the findings in the report. No results found.

## 2023-06-19 NOTE — Assessment & Plan Note (Addendum)
 Stage IV renal cell carcinoma, 1st line treatment with Keytruda  and Axitinib , he did not tolerate Axitinib .  S/p 34 cycles of Keytruda  [ about 2 years's treatment] - stopped in Sept 2023 --> 09/2022 Biopsy via bronchoscopy pathology showed disease recurrence, metastatic clear cell carcinoma  He did not tolerate Carbozantinib 60mg  or 40mg  daily, currently on second dose reduction of Carbozantinib 20mg  daily Labs are reviewed and discussed with patient. So far he tolerates well and continue current regimen.  Repeat CT

## 2023-06-19 NOTE — Assessment & Plan Note (Signed)
 Stable counts.  Monitor.

## 2023-06-19 NOTE — Assessment & Plan Note (Signed)
 avoid nephrotoxins. Encourage oral hydration

## 2023-06-19 NOTE — Assessment & Plan Note (Addendum)
 due to Carbozantinib, better controlled.

## 2023-06-20 LAB — T4: T4, Total: 8.3 ug/dL (ref 4.5–12.0)

## 2023-06-22 ENCOUNTER — Other Ambulatory Visit: Payer: Self-pay

## 2023-06-22 MED FILL — Ferrous Sulfate Tab 325 MG (65 MG Elemental Fe): ORAL | 90 days supply | Qty: 180 | Fill #0 | Status: CN

## 2023-07-10 ENCOUNTER — Encounter: Payer: Self-pay | Admitting: Oncology

## 2023-07-10 ENCOUNTER — Ambulatory Visit
Admission: RE | Admit: 2023-07-10 | Discharge: 2023-07-10 | Disposition: A | Discharge status: Home / Self Care | Payer: Self-pay | Source: Ambulatory Visit | Attending: Oncology | Admitting: Oncology

## 2023-07-10 ENCOUNTER — Other Ambulatory Visit: Payer: Self-pay

## 2023-07-10 DIAGNOSIS — C642 Malignant neoplasm of left kidney, except renal pelvis: Secondary | ICD-10-CM | POA: Insufficient documentation

## 2023-07-10 MED FILL — Ferrous Sulfate Tab 325 MG (65 MG Elemental Fe): ORAL | 90 days supply | Qty: 180 | Fill #0 | Status: AC

## 2023-07-20 ENCOUNTER — Inpatient Hospital Stay: Payer: Self-pay | Attending: Oncology

## 2023-07-20 ENCOUNTER — Encounter: Payer: Self-pay | Admitting: Oncology

## 2023-07-20 ENCOUNTER — Inpatient Hospital Stay (HOSPITAL_BASED_OUTPATIENT_CLINIC_OR_DEPARTMENT_OTHER): Payer: Self-pay | Admitting: Oncology

## 2023-07-20 VITALS — BP 114/76 | HR 71 | Temp 98.3°F | Resp 18 | Wt 162.7 lb

## 2023-07-20 DIAGNOSIS — I5042 Chronic combined systolic (congestive) and diastolic (congestive) heart failure: Secondary | ICD-10-CM | POA: Insufficient documentation

## 2023-07-20 DIAGNOSIS — I10 Essential (primary) hypertension: Secondary | ICD-10-CM

## 2023-07-20 DIAGNOSIS — Z79899 Other long term (current) drug therapy: Secondary | ICD-10-CM | POA: Insufficient documentation

## 2023-07-20 DIAGNOSIS — D696 Thrombocytopenia, unspecified: Secondary | ICD-10-CM | POA: Insufficient documentation

## 2023-07-20 DIAGNOSIS — I130 Hypertensive heart and chronic kidney disease with heart failure and stage 1 through stage 4 chronic kidney disease, or unspecified chronic kidney disease: Secondary | ICD-10-CM | POA: Insufficient documentation

## 2023-07-20 DIAGNOSIS — D631 Anemia in chronic kidney disease: Secondary | ICD-10-CM | POA: Insufficient documentation

## 2023-07-20 DIAGNOSIS — N1832 Chronic kidney disease, stage 3b: Secondary | ICD-10-CM

## 2023-07-20 DIAGNOSIS — C642 Malignant neoplasm of left kidney, except renal pelvis: Secondary | ICD-10-CM

## 2023-07-20 DIAGNOSIS — Z7982 Long term (current) use of aspirin: Secondary | ICD-10-CM | POA: Insufficient documentation

## 2023-07-20 DIAGNOSIS — Z7902 Long term (current) use of antithrombotics/antiplatelets: Secondary | ICD-10-CM | POA: Insufficient documentation

## 2023-07-20 DIAGNOSIS — Z905 Acquired absence of kidney: Secondary | ICD-10-CM | POA: Insufficient documentation

## 2023-07-20 DIAGNOSIS — C7802 Secondary malignant neoplasm of left lung: Secondary | ICD-10-CM | POA: Insufficient documentation

## 2023-07-20 LAB — CBC WITH DIFFERENTIAL (CANCER CENTER ONLY)
Abs Immature Granulocytes: 0.02 K/uL (ref 0.00–0.07)
Basophils Absolute: 0 K/uL (ref 0.0–0.1)
Basophils Relative: 0 %
Eosinophils Absolute: 0.2 K/uL (ref 0.0–0.5)
Eosinophils Relative: 3 %
HCT: 44.1 % (ref 39.0–52.0)
Hemoglobin: 14.6 g/dL (ref 13.0–17.0)
Immature Granulocytes: 0 %
Lymphocytes Relative: 27 %
Lymphs Abs: 2 K/uL (ref 0.7–4.0)
MCH: 31.1 pg (ref 26.0–34.0)
MCHC: 33.1 g/dL (ref 30.0–36.0)
MCV: 94 fL (ref 80.0–100.0)
Monocytes Absolute: 0.7 K/uL (ref 0.1–1.0)
Monocytes Relative: 9 %
Neutro Abs: 4.5 K/uL (ref 1.7–7.7)
Neutrophils Relative %: 61 %
Platelet Count: 133 K/uL — ABNORMAL LOW (ref 150–400)
RBC: 4.69 MIL/uL (ref 4.22–5.81)
RDW: 13.8 % (ref 11.5–15.5)
WBC Count: 7.4 K/uL (ref 4.0–10.5)
nRBC: 0 % (ref 0.0–0.2)

## 2023-07-20 LAB — CMP (CANCER CENTER ONLY)
ALT: 29 U/L (ref 0–44)
AST: 40 U/L (ref 15–41)
Albumin: 3.9 g/dL (ref 3.5–5.0)
Alkaline Phosphatase: 87 U/L (ref 38–126)
Anion gap: 6 (ref 5–15)
BUN: 32 mg/dL — ABNORMAL HIGH (ref 8–23)
CO2: 23 mmol/L (ref 22–32)
Calcium: 8.5 mg/dL — ABNORMAL LOW (ref 8.9–10.3)
Chloride: 107 mmol/L (ref 98–111)
Creatinine: 2.04 mg/dL — ABNORMAL HIGH (ref 0.61–1.24)
GFR, Estimated: 36 mL/min — ABNORMAL LOW (ref >60)
Glucose, Bld: 103 mg/dL — ABNORMAL HIGH (ref 70–99)
Potassium: 4.3 mmol/L (ref 3.5–5.1)
Sodium: 136 mmol/L (ref 135–145)
Total Bilirubin: 0.9 mg/dL (ref 0.0–1.2)
Total Protein: 6.7 g/dL (ref 6.5–8.1)

## 2023-07-20 NOTE — Assessment & Plan Note (Signed)
 Stable counts.  Monitor.

## 2023-07-20 NOTE — Assessment & Plan Note (Addendum)
 avoid nephrotoxins. Encourage oral hydration Advise patient to discuss with PCP about lasix  dosage

## 2023-07-20 NOTE — Progress Notes (Signed)
 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  S/p 34 cycles of Keytruda  [ about 2 years's treatment] - stopped in Sept 2023 --> 09/2022 Biopsy via bronchoscopy pathology showed disease recurrence, metastatic clear cell carcinoma  He did not tolerate Carbozantinib 60mg  or 40mg  daily, currently on second dose reduction of Carbozantinib 20mg  daily Labs are reviewed and discussed with patient.  he tolerates well and continue current regimen. CT showed stable disease.    Thrombocytopenia (HCC) Stable counts. Monitor   Stage 3b chronic kidney disease (HCC) avoid nephrotoxins. Encourage oral hydration Advise patient to discuss with PCP about lasix  dosage  Essential hypertension due to Carbozantinib, better controlled.     Orders Placed This Encounter  Procedures   CBC with Differential (Cancer Center Only)    Standing Status:   Future    Expected Date:   09/20/2023    Expiration Date:   12/19/2023   CMP (Cancer Center only)    Standing Status:   Future    Expected Date:   09/20/2023    Expiration Date:   12/19/2023   Patient is Spanish-speaking.   Spanish interpreter service utilized for entire encounter   Return visit  2 months All questions were answered. The patient knows to call the clinic with any problems, questions or concerns.  Call Babara, MD, PhD [facility] Hematology Oncology 07/20/2023   PERTINENT HISTORY-  63 y.o. male presents for follow-up of renal cell carcinoma  Oncology History  Renal cell carcinoma of left kidney (HCC)  05/23/2019  Initial Diagnosis   Renal cell carcinoma of left kidney   -05/02/2019-05/07/2019 due to gross hematuria. CT showed a 7.4 cm left lower pole renal mass concerning for RCC.  Patient also has necrotic lymphadenopathy in the mediastinum and the right supra hilar region which were highly suspicious for metastatic disease.  Bilateral lung nodules, also concerning for metastatic lung disease.  Right retrocrural lymphadenopathy with upper normal left periaortic lymph node. Patient was recommended for cytoreductive radical nephrectomy.  With history of ANCA positive vasculitis, also recommend thoracic lymphadenopathy biopsy to confirm distal metastasis.  Patient agreed to radical nephrectomy and declined bronchoscopy biopsy. He underwent left radical nephrectomy on 05/05/2019. Pathology showed pT3a pNx, RCC, conventional clear cell type, grade 3    05/23/2019 Cancer Staging   Staging form: Kidney, AJCC 8th Edition - Pathologic: Stage IV (pT3a, pNX, cM1) - Signed by Babara Call, MD on 05/23/2019   06/07/2019 - 09/25/2021 Chemotherapy   Keytruda  + Axitinib .  06/07/19 He started Axitinib  5mg  BID, stopped on 07/25/2020 due to AKI, resumed on 10/04/2019, stopped again on 05/01/2020 due to diarrhea. 09/11/2020 resumed axitinib  5 mg twice daily, stopped on 10/23/2020 due to diarrhea.  01/17/2021 Keytruda  was held due to transaminitis, immunotherapy induced liver toxicities. 02/20/2021, resumed Keytruda .  09/25/2021 last dose of Keytruda     01/12/2020 Imaging   CT showed overall marked improvement.  Comparing to CT scan in August 2021, continues to have treatment response.Mediastinal soft tissue nodule nearly completely resolved, right minor fissure nodule 8 mm, decreased in size.  Resolution of pleural  fluid and thickening of the right chest.  Small pleural effusion in the left chest.  No new lung nodules.-Subtle variation of the muscle architecture of the left psoas muscle of unknown significance   04/30/2020 Imaging   CT  chest abdomen pelvis without contrast showed stable disease   11/28/2020 Imaging   CT abdomen pelvis without contrast showed stable examination.  No evidence of new or progressive disease in the chest abdomen pelvis.  Stable to minimally decreased size of 5 mm pulmonary nodule in the minor fissure in the right chest.  Sigmoid colon diverticulosis without findings of acute diverticulitis.   03/08/2021 Imaging   CT scan showed NED   07/31/2021 Imaging   CT chest abdomen pelvis showed stable disease   08/27/2021 Imaging   MRI brain with and without contrast Decrease in size of lesions presumably reflecting neurocysticercosis. No evidence new metastatic disease   08/29/2021 Imaging   CT Chest abdomen pelvis w contrast 1. No findings of active malignancy. 6 by 4 mm right upper lobe nodule along the major fissure is stable and likely represents a previously treated metastatic lesion. 2. Coronary artery atherosclerosis disproportionate to atherosclerosis elsewhere in the chest, abdomen, and pelvis. 3. Mild surgical bronchiectasis in both lower lobes. 4. Mild prostatomegaly. 5. 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 Status post left nephrectomy, without recurrent or metastatic disease. 2. Similar small right-sided pulmonary nodules. 3. New or progressive posterior left upper lobe clustered nodularity and ground-glass, suspicious for minimal infection. 4.  Coronary artery atherosclerosi   09/25/2022 Imaging   CT chest abdomen pelvis w contrast showed CHEST:   1. New nodularity in the LEFT upper lobe with new mediastinal lymphadenopathy. 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 2. No skeletal metastasis.   PELVIS:   1. No evidence of local recurrence in the LEFT nephrectomy bed. 2. No evidence of metastatic disease in the abdomen pelvis.    10/08/2022 Imaging   PET scan showed   1. Hypermetabolic mediastinal and left hilar lymphadenopathy with hypermetabolic nodularity in the posterior left upper lobe. Imaging features are compatible with metastatic disease. 2. No evidence for metastatic disease in the abdomen or pelvis. 3. Changes of avascular necrosis in the femoral heads bilaterally.    10/10/2022 Relapse/Recurrence   S/p biopsy via bronchoscopy  1. Lung, biopsy, Left upper lobe :       - METASTATIC CLEAR CELL RENAL CELL CARCINOMA   Bronchial Brushing Lung, upper lobe  Fine Needle Aspiration  - POSITIVE FOR MALIGNANCY.       - CONSISTENT WITH METASTATIC CLEAR CELL RENAL CELL CARCINOMA.     12/10/2022 - 02/10/2023 Chemotherapy   cabozantinib 40mg  daily, stopped due diarrhea, poor appetite, bad taste   02/12/2023 Imaging   CT chest abdomen pelvis wo contrast showed  1. Decreased size of the thoracic lymph nodes and left upper lobe nodularity. 2. No evidence of new or progressive metastatic disease in the chest, abdomen or pelvis. 3. Colonic diverticulosis without findings of acute diverticulitis. 4. Avascular necrosis of the femoral heads.   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  Patient has been referred to orthopedic surgeon in [place] which he is not able to go due to transportation.  He does not have insurance as well.  He is currently asymptomatic.  Monitor.  Patient has past medical history CAD status post PCI to LAD in 2018, on aspirin and Plavix, CHF, history of focal sclerosing/crescentic GN-ANCA positive and CKD stage III. 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 He finished 14 days of albendazole  plus praziquantel  .   He has had few side effects including poor appetite, hair loss, weight loss, bloody stool, abdominal cramping or diarrhea. All symptoms have improved since he completed the treatment. 03/16/2020 he finished tapering course  of steroids  #Intermittent orthopnea,Pre-existing diastolic CHF, 05/04/2019 LVEF 60 to 65%.   Recommend him to follow-up with cardiology-he is has not able to follow-up with cardiology due to lack of insurance coverage.  # He has frequent urination at night.  Previously tried Cardura for BPH and had to stop due hypotension. 07/2022 S/p EGD and colonoscopy - diverticulosis, non bleeding internal hemorrhoids.  Gastritis.  INTERVAL HISTORY Mark Donaldson is a 63 y.o. male who has above history reviewed by me today presents for follow up visit for metastatic RCC He tolerates cabozantinib 20mg  daily  good appetite, denies nausea vomiting diarrhea.     Review of systems- Review of Systems  Constitutional:  Negative for appetite change, chills, fatigue, fever and unexpected weight change.  HENT:   Negative for voice change.   Eyes:  Negative for eye problems and icterus.  Respiratory:  Negative for chest tightness and cough.   Cardiovascular:  Negative for chest pain and leg swelling.  Gastrointestinal:  Negative for abdominal distention, abdominal pain, blood in stool, diarrhea and rectal pain.  Endocrine: Negative for hot flashes.  Genitourinary:  Negative for difficulty urinating, dysuria, frequency and nocturia.   Musculoskeletal:  Positive for arthralgias and back pain.  Skin:  Negative for itching and rash.  Neurological:  Negative for light-headedness and numbness.  Hematological:  Negative for adenopathy. Does not bruise/bleed easily.  Psychiatric/Behavioral:  Negative for confusion.     No Known Allergies  Patient Active Problem List   Diagnosis Date Noted   Renal cell carcinoma of left kidney (HCC) 05/23/2019    Priority: High   Thrombocytopenia (HCC) 05/19/2023    Priority: Medium    Stage 3b chronic kidney disease (HCC) 10/04/2019    Priority: Medium    Essential hypertension 05/18/2016    Priority: Medium    Chemotherapy induced diarrhea 02/12/2023     Priority: Low   Rectal bleeding 11/24/2022    Priority: Low   IDA (iron deficiency anemia) 11/21/2021    Priority: Low   Transaminitis 01/17/2021    Priority: Low   Encounter for antineoplastic immunotherapy 07/19/2019    Priority: Low   Encounter for antineoplastic chemotherapy 07/19/2019    Priority: Low   CNS lesion 07/07/2019    Priority: Low   Goals of care, counseling/discussion 05/23/2019    Priority: Low   Need for prophylactic vaccination and inoculation against influenza 11/13/2022   Gastritis without bleeding 07/30/2022   Cough 06/03/2022   Avascular necrosis of bones of both hips (HCC) 06/20/2021   History of CHF (congestive heart failure) 05/01/2020   Hyperglycemia 02/28/2020   Brain lesion 10/04/2019   Anemia in stage 3b chronic kidney disease (HCC) 06/07/2019   Renal cell carcinoma (HCC) 05/23/2019   Palliative care encounter    History of vasculitis    Thoracic lymphadenopathy    Lung nodules    CAD S/P percutaneous coronary angioplasty 05/02/2019   Kidney mass 05/02/2019   Hydronephrosis of left kidney 05/02/2019   UTI (urinary tract infection) 05/02/2019  AKI (acute kidney injury) (HCC) 05/02/2019   Gross hematuria 05/02/2019   Chronic diastolic CHF (congestive heart failure) (HCC) 05/02/2019   Sepsis (HCC) 05/02/2019   Severe sepsis (HCC) 05/02/2019   Preop cardiovascular exam    Hyperlipidemia 05/21/2016   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) 05/21/2016   Prediabetes 05/21/2016   Alcohol abuse 05/18/2016   Vasculitis, 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HCC) 05/18/2016   Shortness of breath 05/17/2016     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HCC)    Avascular necrosis of bones of both hips (HCC)    CAD (coronary artery disease)    Essential hypertension    Gastritis without bleeding    HFrEF (heart failure with reduced ejection fraction) (HCC)    Hyperlipidemia LDL goal <70    Iron deficiency anemia    Ischemic  cardiomyopathy    Lung nodules 09/2022   left upper lobe   Mediastinal adenopathy 09/2022   Renal cell carcinoma (HCC) 05/23/2019   Renal disorder    Severe sepsis (HCC)    Stage 3b chronic kidney disease (CKD) (HCC)      Past Surgical History:  Procedure Laterality Date   BIOPSY  07/30/2022   Procedure: BIOPSY;  Surgeon: Jinny Carmine, MD;  Location: [facility];  Service: Endoscopy;;   COLONOSCOPY WITH PROPOFOL  N/A 07/30/2022   Procedure: COLONOSCOPY WITH PROPOFOL ;  Surgeon: Jinny Carmine, MD;  Location: [facility];  Service: Endoscopy;  Laterality: N/A;  SPANISH INTERPRETER   ENDOBRONCHIAL ULTRASOUND Left 10/10/2022   Procedure: ENDOBRONCHIAL ULTRASOUND;  Surgeon: Tamea Dedra CROME, MD;  Location: [facility];  Service: Pulmonary;  Laterality: Left;   ESOPHAGOGASTRODUODENOSCOPY (EGD) WITH PROPOFOL  N/A 07/30/2022   Procedure: ESOPHAGOGASTRODUODENOSCOPY (EGD) WITH PROPOFOL ;  Surgeon: Jinny Carmine, MD;  Location: [facility];  Service: Endoscopy;  Laterality: N/A;   LAPAROSCOPIC NEPHRECTOMY, HAND ASSISTED Left 05/05/2019   Procedure: HAND ASSISTED LAPAROSCOPIC NEPHRECTOMY;  Surgeon: Penne Knee, MD;  Location: [facility];  Service: Urology;  Laterality: Left;   LEFT HEART CATH AND CORONARY ANGIOGRAPHY  05/20/2016   LAD lesion s/p PCI with Xience    Social History   Socioeconomic History   Marital status: Married    Spouse name: [place]   Number of children: 4   Years of education: Not on file   Highest education level: Not on file  Occupational History   Not on file  Tobacco Use   Smoking status: Never   Smokeless tobacco: Never  Vaping Use   Vaping status: Never Used  Substance and Sexual Activity   Alcohol use: Not Currently   Drug use: Never   Sexual activity: Not on file  Other Topics Concern   Not on file  Social History Narrative   Not on file   Social Drivers of Health   Financial Resource Strain: Not on file  Food Insecurity: No Food Insecurity  (07/14/2022)   Received from [facility]   Hunger Vital Sign    Within the past 12 months, you worried that your food would run out before you got the money to buy more.: Never true    Within the past 12 months, the food you bought just didn't last and you didn't have money to get more.: Never true  Transportation Needs: No Transportation Needs (07/14/2022)   Received from [facility]   PRAPARE - Transportation    Lack of Transportation (Medical): No    Lack of Transportation (Non-Medical): No  Physical Activity: Not on file  Stress:  Not on file  Social Connections: Not on file  Intimate Partner Violence: Not on file     Family History  Problem Relation Age of Onset   Cancer Mother    Blindness Brother      Current Outpatient Medications:    albuterol  (VENTOLIN  HFA) 108 (90 Base) MCG/ACT inhaler, Inhale 2 puffs into the lungs every 6 (six) hours as needed for wheezing or shortness of breath., Disp: 6.7 g, Rfl: 2   amLODipine  (NORVASC ) 5 MG tablet, Take 5 mg by mouth daily., Disp: , Rfl:    aspirin 81 MG EC tablet, Take 81 mg by mouth daily. Swallow whole., Disp: , Rfl:    atorvastatin  (LIPITOR ) 80 MG tablet, Take 80 mg by mouth at bedtime., Disp: , Rfl:    cabozantinib (CABOMETYX ) 20 MG tablet, Take 1 tablet (20 mg total) by mouth daily. Take on an empty stomach, 1 hour before or 2 hours after meals., Disp: 30 tablet, Rfl: 2   clopidogrel (PLAVIX) 75 MG tablet, Take 75 mg by mouth daily., Disp: , Rfl:    ferrous sulfate  (FEROSUL) 325 (65 FE) MG tablet, Take 1 tablet (325 mg total) by mouth 2 (two) times daily with a meal., Disp: 180 tablet, Rfl: 0   Fluticasone -Umeclidin-Vilant (TRELEGY ELLIPTA ) 100-62.5-25 MCG/ACT AEPB, Inhale 1 puff into the lungs daily., Disp: 60 each, Rfl: 11   furosemide  (LASIX ) 20 MG tablet, Take 1 tablet (20 mg total) by mouth daily., Disp: 30 tablet, Rfl: 3   losartan (COZAAR) 100 MG tablet, Take 100 mg by mouth daily., Disp: , Rfl:    metoprolol   tartrate (LOPRESSOR ) 100 MG tablet, Take 150 mg by mouth daily., Disp: , Rfl:    mirtazapine (REMERON) 15 MG tablet, Take 15 mg by mouth at bedtime., Disp: , Rfl:    losartan (COZAAR) 50 MG tablet, Take 50 mg by mouth 2 (two) times daily. (Patient not taking: Reported on 07/20/2023), Disp: , Rfl:    Physical exam:  Vitals:   07/20/23 1354  BP: 114/76  Pulse: 71  Resp: 18  Temp: 98.3 F (36.8 C)  TempSrc: Tympanic  SpO2: 98%  Weight: 162 lb 11.2 oz (73.8 kg)    Physical Exam Constitutional:      General: He is not in acute distress.    Appearance: He is not diaphoretic.  HENT:     Head: Normocephalic and atraumatic.  Eyes:     General: No scleral icterus. Cardiovascular:     Rate and Rhythm: Normal rate and regular rhythm.  Pulmonary:     Effort: Pulmonary effort is normal. No respiratory distress.  Abdominal:     General: There is no distension.     Palpations: Abdomen is soft.     Tenderness: There is no abdominal tenderness.  Musculoskeletal:        General: Normal range of motion.     Cervical back: Normal range of motion.  Skin:    General: Skin is warm and dry.     Findings: No erythema.  Neurological:     Mental Status: He is alert and oriented to person, place, and time. Mental status is at baseline.     Cranial Nerves: No cranial nerve deficit.     Motor: No abnormal muscle tone.  Psychiatric:        Mood and Affect: Mood and affect normal.       Labs    Latest Ref Rng & Units 07/20/2023    1:50 PM 06/19/2023  11:37 AM 05/19/2023   10:09 AM  CBC  WBC 4.0 - 10.5 K/uL 7.4  7.5  7.2   Hemoglobin 13.0 - 17.0 g/dL 85.3  84.7  84.5   Hematocrit 39.0 - 52.0 % 44.1  45.0  45.7   Platelets 150 - 400 K/uL 133  125  135       Latest Ref Rng & Units 07/20/2023    1:50 PM 06/19/2023   11:37 AM 05/19/2023   10:09 AM  CMP  Glucose 70 - 99 mg/dL 896  892  898   BUN 8 - 23 mg/dL 32  35  34   Creatinine 0.61 - 1.24 mg/dL 7.95  8.10  8.26   Sodium 135 - 145 mmol/L 136   138  138   Potassium 3.5 - 5.1 mmol/L 4.3  4.2  4.6   Chloride 98 - 111 mmol/L 107  107  106   CO2 22 - 32 mmol/L 23  22  22    Calcium  8.9 - 10.3 mg/dL 8.5  8.9  9.1   Total Protein 6.5 - 8.1 g/dL 6.7  6.9  6.8   Total Bilirubin 0.0 - 1.2 mg/dL 0.9  0.9  0.8   Alkaline Phos 38 - 126 U/L 87  104  116   AST 15 - 41 U/L 40  43  48   ALT 0 - 44 U/L 29  34  44       RADIOGRAPHIC STUDIES: I have personally reviewed the radiological images as listed and agreed with the findings in the report. CT Chest Wo Contrast Result Date: 07/18/2023 CLINICAL DATA:  Kidney cancer. Active surveillance of renal cell carcinoma of left kidney. EXAM: CT CHEST WITHOUT CONTRAST TECHNIQUE: Multidetector CT imaging of the chest was performed following the standard protocol without IV contrast. RADIATION DOSE REDUCTION: This exam was performed according to the departmental dose-optimization program which includes automated exposure control, adjustment of the mA and/or kV according to patient size and/or use of iterative reconstruction technique. COMPARISON:  CT chest 02/12/2023.  PET CT 10/08/2022 FINDINGS: Cardiovascular: The normal heart size. No pericardial effusions. Normal caliber thoracic aorta. Calcification of the coronary arteries. Mediastinum/Nodes: Thyroid  gland is unremarkable. Esophagus is decompressed. Prominent mediastinal and left hilar lymphadenopathy. Largest lymph nodes in the aortopulmonary window measure 2.7 cm diameter. No change since the previous study. No new lymphadenopathy is identified. Lungs/Pleura: Patchy ground-glass infiltrates in the lungs may represent multifocal pneumonia or edema. No pleural effusion or pneumothorax. Left pulmonary nodule in the upper lung measuring 10 mm average diameter. Series 4, image 46. No change since previous study. No new nodules are identified. Upper Abdomen: No acute abnormalities. Musculoskeletal: No focal bone lesion or bone destruction. IMPRESSION: 1. Prominent  mediastinal and left hilar lymphadenopathy, unchanged. Index left aortopulmonic window node measures 2.7 cm, unchanged. 2. Left upper lung pulmonary nodule measures 10 mm average diameter. No change. 3. Patchy ground-glass infiltrates in the lungs may represent multifocal pneumonia or edema. Electronically Signed   By: Elsie Gravely M.D.   On: 07/18/2023 00:42

## 2023-07-20 NOTE — Assessment & Plan Note (Signed)
 due to Carbozantinib, better controlled.

## 2023-07-20 NOTE — Assessment & Plan Note (Addendum)
 Stage IV renal cell carcinoma, 1st line treatment with Keytruda  and Axitinib , he did not tolerate Axitinib .  S/p 34 cycles of Keytruda  [ about 2 years's treatment] - stopped in Sept 2023 --> 09/2022 Biopsy via bronchoscopy pathology showed disease recurrence, metastatic clear cell carcinoma  He did not tolerate Carbozantinib 60mg  or 40mg  daily, currently on second dose reduction of Carbozantinib 20mg  daily Labs are reviewed and discussed with patient.  he tolerates well and continue current regimen. CT showed stable disease.

## 2023-08-11 ENCOUNTER — Other Ambulatory Visit: Payer: Self-pay

## 2023-08-12 ENCOUNTER — Other Ambulatory Visit: Payer: Self-pay

## 2023-08-12 DIAGNOSIS — C642 Malignant neoplasm of left kidney, except renal pelvis: Secondary | ICD-10-CM

## 2023-08-12 MED ORDER — CABOMETYX 20 MG PO TABS
20.0000 mg | ORAL_TABLET | Freq: Every day | ORAL | 2 refills | Status: DC
Start: 2023-08-12 — End: 2023-09-30

## 2023-08-24 ENCOUNTER — Other Ambulatory Visit: Payer: Self-pay

## 2023-09-16 ENCOUNTER — Other Ambulatory Visit: Payer: Self-pay

## 2023-09-22 ENCOUNTER — Inpatient Hospital Stay (HOSPITAL_BASED_OUTPATIENT_CLINIC_OR_DEPARTMENT_OTHER): Payer: Self-pay | Admitting: Oncology

## 2023-09-22 ENCOUNTER — Encounter: Payer: Self-pay | Admitting: Oncology

## 2023-09-22 ENCOUNTER — Inpatient Hospital Stay: Payer: Self-pay | Attending: Oncology

## 2023-09-22 VITALS — BP 125/86 | HR 68 | Temp 96.3°F | Resp 18 | Wt 162.0 lb

## 2023-09-22 DIAGNOSIS — T451X5A Adverse effect of antineoplastic and immunosuppressive drugs, initial encounter: Secondary | ICD-10-CM

## 2023-09-22 DIAGNOSIS — Z7902 Long term (current) use of antithrombotics/antiplatelets: Secondary | ICD-10-CM | POA: Insufficient documentation

## 2023-09-22 DIAGNOSIS — K521 Toxic gastroenteritis and colitis: Secondary | ICD-10-CM

## 2023-09-22 DIAGNOSIS — D696 Thrombocytopenia, unspecified: Secondary | ICD-10-CM

## 2023-09-22 DIAGNOSIS — I10 Essential (primary) hypertension: Secondary | ICD-10-CM

## 2023-09-22 DIAGNOSIS — Z905 Acquired absence of kidney: Secondary | ICD-10-CM | POA: Insufficient documentation

## 2023-09-22 DIAGNOSIS — Z7982 Long term (current) use of aspirin: Secondary | ICD-10-CM | POA: Insufficient documentation

## 2023-09-22 DIAGNOSIS — C642 Malignant neoplasm of left kidney, except renal pelvis: Secondary | ICD-10-CM | POA: Insufficient documentation

## 2023-09-22 DIAGNOSIS — N1832 Chronic kidney disease, stage 3b: Secondary | ICD-10-CM

## 2023-09-22 DIAGNOSIS — Z79899 Other long term (current) drug therapy: Secondary | ICD-10-CM | POA: Insufficient documentation

## 2023-09-22 DIAGNOSIS — C7802 Secondary malignant neoplasm of left lung: Secondary | ICD-10-CM | POA: Insufficient documentation

## 2023-09-22 DIAGNOSIS — I130 Hypertensive heart and chronic kidney disease with heart failure and stage 1 through stage 4 chronic kidney disease, or unspecified chronic kidney disease: Secondary | ICD-10-CM | POA: Insufficient documentation

## 2023-09-22 DIAGNOSIS — I5042 Chronic combined systolic (congestive) and diastolic (congestive) heart failure: Secondary | ICD-10-CM | POA: Insufficient documentation

## 2023-09-22 DIAGNOSIS — D631 Anemia in chronic kidney disease: Secondary | ICD-10-CM | POA: Insufficient documentation

## 2023-09-22 LAB — CMP (CANCER CENTER ONLY)
ALT: 27 U/L (ref 0–44)
AST: 34 U/L (ref 15–41)
Albumin: 3.8 g/dL (ref 3.5–5.0)
Alkaline Phosphatase: 87 U/L (ref 38–126)
Anion gap: 8 (ref 5–15)
BUN: 31 mg/dL — ABNORMAL HIGH (ref 8–23)
CO2: 22 mmol/L (ref 22–32)
Calcium: 8.7 mg/dL — ABNORMAL LOW (ref 8.9–10.3)
Chloride: 106 mmol/L (ref 98–111)
Creatinine: 1.78 mg/dL — ABNORMAL HIGH (ref 0.61–1.24)
GFR, Estimated: 43 mL/min — ABNORMAL LOW (ref >60)
Glucose, Bld: 104 mg/dL — ABNORMAL HIGH (ref 70–99)
Potassium: 4.5 mmol/L (ref 3.5–5.1)
Sodium: 136 mmol/L (ref 135–145)
Total Bilirubin: 0.7 mg/dL (ref 0.0–1.2)
Total Protein: 6.5 g/dL (ref 6.5–8.1)

## 2023-09-22 LAB — CBC WITH DIFFERENTIAL (CANCER CENTER ONLY)
Abs Immature Granulocytes: 0.03 K/uL (ref 0.00–0.07)
Basophils Absolute: 0 K/uL (ref 0.0–0.1)
Basophils Relative: 0 %
Eosinophils Absolute: 0.4 K/uL (ref 0.0–0.5)
Eosinophils Relative: 5 %
HCT: 43.1 % (ref 39.0–52.0)
Hemoglobin: 14.2 g/dL (ref 13.0–17.0)
Immature Granulocytes: 0 %
Lymphocytes Relative: 23 %
Lymphs Abs: 1.8 K/uL (ref 0.7–4.0)
MCH: 31.5 pg (ref 26.0–34.0)
MCHC: 32.9 g/dL (ref 30.0–36.0)
MCV: 95.6 fL (ref 80.0–100.0)
Monocytes Absolute: 0.9 K/uL (ref 0.1–1.0)
Monocytes Relative: 11 %
Neutro Abs: 4.9 K/uL (ref 1.7–7.7)
Neutrophils Relative %: 61 %
Platelet Count: 128 K/uL — ABNORMAL LOW (ref 150–400)
RBC: 4.51 MIL/uL (ref 4.22–5.81)
RDW: 14 % (ref 11.5–15.5)
WBC Count: 8 K/uL (ref 4.0–10.5)
nRBC: 0 % (ref 0.0–0.2)

## 2023-09-22 MED ORDER — LOPERAMIDE HCL 2 MG PO CAPS: 2.0000 mg | ORAL_CAPSULE | ORAL | 2 refills | Status: AC

## 2023-09-22 NOTE — Assessment & Plan Note (Signed)
 due to Carbozantinib, better controlled.  Continue current BP regimen

## 2023-09-22 NOTE — Progress Notes (Signed)
Patient here for follow up. No new concerns voiced.  °

## 2023-09-22 NOTE — Assessment & Plan Note (Addendum)
 Recommend imodium  PRN. Discussed instructions in details.

## 2023-09-22 NOTE — Assessment & Plan Note (Signed)
 avoid nephrotoxins. Encourage oral hydration

## 2023-09-22 NOTE — Assessment & Plan Note (Addendum)
 Stage IV renal cell carcinoma, 1st line treatment with Keytruda  and Axitinib , he did not tolerate Axitinib .  S/p 34 cycles of Keytruda  [ about 2 years's treatment] - stopped in Sept 2023 --> 09/2022 Biopsy via bronchoscopy pathology showed disease recurrence, metastatic clear cell carcinoma  He did not tolerate Carbozantinib 60mg  or 40mg  daily, currently on second dose reduction of Carbozantinib 20mg  daily Labs are reviewed and discussed with patient.  he tolerates well and continue current regimen.

## 2023-09-22 NOTE — Assessment & Plan Note (Signed)
 Stable counts.  Monitor.

## 2023-09-22 NOTE — Progress Notes (Signed)
 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  S/p 34 cycles of Keytruda  [ about 2 years's treatment] - stopped in Sept 2023 --> 09/2022 Biopsy via bronchoscopy pathology showed disease recurrence, metastatic clear cell carcinoma  He did not tolerate Carbozantinib 60mg  or 40mg  daily, currently on second dose reduction of Carbozantinib 20mg  daily Labs are reviewed and discussed with patient.  he tolerates well and continue current regimen.   Chemotherapy induced diarrhea Recommend imodium  PRN. Discussed instructions in details.    Essential hypertension due to Carbozantinib, better controlled.  Continue current BP regimen  Stage 3b chronic kidney disease (HCC) avoid nephrotoxins. Encourage oral hydration   Thrombocytopenia (HCC) Stable counts. Monitor    Orders Placed This Encounter  Procedures   CT CHEST ABDOMEN PELVIS WO CONTRAST    Standing Status:   Future    Expected Date:   11/22/2023    Expiration Date:   09/21/2024    Preferred imaging location?:   [facility]    If indicated for the ordered procedure, I authorize the administration of oral contrast media per Radiology protocol:   Yes    Does the patient have a contrast media/X-ray dye allergy?:   No   CBC with Differential (Cancer Center Only)    Standing Status:   Future    Expected Date:   11/22/2023    Expiration Date:   02/20/2024   CMP (Cancer Center only)    Standing Status:   Future    Expected Date:   11/22/2023    Expiration Date:   02/20/2024   Patient is Spanish-speaking.    Spanish interpreter service utilized for entire encounter   Return visit  2 months All questions were answered. The patient knows to call the clinic with any problems, questions or concerns.  Call Babara, MD, PhD [facility] Hematology Oncology 09/22/2023   PERTINENT HISTORY-  63 y.o. male presents for follow-up of renal cell carcinoma  Oncology History  Renal cell carcinoma of left kidney (HCC)  05/23/2019 Initial Diagnosis   Renal cell carcinoma of left kidney   -05/02/2019-05/07/2019 due to gross hematuria. CT showed a 7.4 cm left lower pole renal mass concerning for RCC.  Patient also has necrotic lymphadenopathy in the mediastinum and the right supra hilar region which were highly suspicious for metastatic disease.  Bilateral lung nodules, also concerning for metastatic lung disease.  Right retrocrural lymphadenopathy with upper normal left periaortic lymph node. Patient was recommended for cytoreductive radical nephrectomy.  With history of ANCA positive vasculitis, also recommend thoracic lymphadenopathy biopsy to confirm distal metastasis.  Patient agreed to radical nephrectomy and declined bronchoscopy biopsy. He underwent left radical nephrectomy on 05/05/2019. Pathology showed pT3a pNx, RCC, conventional clear cell type, grade 3    05/23/2019 Cancer Staging   Staging form: Kidney, AJCC 8th Edition - Pathologic: Stage IV (pT3a, pNX, cM1) - Signed by Babara Call, MD on 05/23/2019   06/07/2019 - 09/25/2021 Chemotherapy   Keytruda  + Axitinib .  06/07/19 He started Axitinib  5mg  BID, stopped on 07/25/2020 due to AKI, resumed on 10/04/2019, stopped again  on 05/01/2020 due to diarrhea. 09/11/2020 resumed axitinib  5 mg twice daily, stopped on 10/23/2020 due to diarrhea.  01/17/2021 Keytruda  was held due to transaminitis, immunotherapy induced liver toxicities. 02/20/2021, resumed Keytruda .  09/25/2021 last dose of Keytruda     01/12/2020 Imaging   CT showed overall marked improvement.  Comparing to CT  scan in August 2021, continues to have treatment response.Mediastinal soft tissue nodule nearly completely resolved, right minor fissure nodule 8 mm, decreased in size.  Resolution of pleural fluid and thickening of the right chest.  Small pleural effusion in the left chest.  No new lung nodules.-Subtle variation of the muscle architecture of the left psoas muscle of unknown significance   04/30/2020 Imaging   CT chest abdomen pelvis without contrast showed stable disease   11/28/2020 Imaging   CT abdomen pelvis without contrast showed stable examination.  No evidence of new or progressive disease in the chest abdomen pelvis.  Stable to minimally decreased size of 5 mm pulmonary nodule in the minor fissure in the right chest.  Sigmoid colon diverticulosis without findings of acute diverticulitis.   03/08/2021 Imaging   CT scan showed NED   07/31/2021 Imaging   CT chest abdomen pelvis showed stable disease   08/27/2021 Imaging   MRI brain with and without contrast Decrease in size of lesions presumably reflecting neurocysticercosis. No evidence new metastatic disease   08/29/2021 Imaging   CT Chest abdomen pelvis w contrast 1. No findings of active malignancy. 6 by 4 mm right upper lobe nodule along the major fissure is stable and likely represents a previously treated metastatic lesion. 2. Coronary artery atherosclerosis disproportionate to atherosclerosis elsewhere in the chest, abdomen, and pelvis. 3. Mild surgical bronchiectasis in both lower lobes. 4. Mild prostatomegaly. 5. 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 Status post left nephrectomy, without recurrent or metastatic disease. 2. Similar small right-sided pulmonary nodules. 3. New or progressive posterior left upper lobe clustered nodularity and ground-glass, suspicious for minimal infection. 4.  Coronary artery atherosclerosi   09/25/2022  Imaging   CT chest abdomen pelvis w contrast showed CHEST:   1. New nodularity in the LEFT upper lobe with new mediastinal lymphadenopathy. 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 2. No skeletal metastasis.   PELVIS:   1. No evidence of local recurrence in the LEFT nephrectomy bed. 2. No evidence of metastatic disease in the abdomen pelvis.    10/08/2022 Imaging   PET scan showed  1. Hypermetabolic mediastinal and left hilar lymphadenopathy with hypermetabolic nodularity in the posterior left upper lobe. Imaging features are compatible with metastatic disease. 2. No evidence for metastatic disease in the abdomen or pelvis. 3. Changes of avascular necrosis in the femoral heads bilaterally.    10/10/2022 Relapse/Recurrence   S/p biopsy via bronchoscopy  1. Lung, biopsy, Left upper lobe :       - METASTATIC CLEAR CELL RENAL CELL CARCINOMA   Bronchial Brushing Lung, upper lobe  Fine Needle Aspiration  - POSITIVE FOR MALIGNANCY.       - CONSISTENT WITH METASTATIC CLEAR CELL RENAL CELL CARCINOMA.     12/10/2022 - 02/10/2023 Chemotherapy   cabozantinib 40mg  daily, stopped due diarrhea, poor appetite, bad taste   02/12/2023 Imaging   CT chest abdomen pelvis wo contrast showed  1. Decreased size of the thoracic lymph nodes and left upper lobe nodularity. 2. No evidence of new or progressive  metastatic disease in the chest, abdomen or pelvis. 3. Colonic diverticulosis without findings of acute diverticulitis. 4. Avascular necrosis of the femoral heads.   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  Patient has been referred to orthopedic surgeon in [place] which he is not able to go due to transportation.  He does not have insurance as well.  He is currently asymptomatic.  Monitor.  Patient has past medical history CAD status post PCI to LAD in 2018, on aspirin and  Plavix, CHF, history of focal sclerosing/crescentic GN-ANCA positive and CKD stage III. 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 He finished 14 days of albendazole  plus praziquantel  .   He has had few side effects including poor appetite, hair loss, weight loss, bloody stool, abdominal cramping or diarrhea. All symptoms have improved since he completed the treatment. 03/16/2020 he finished tapering course of steroids  #Intermittent orthopnea,Pre-existing diastolic CHF, 05/04/2019 LVEF 60 to 65%.   Recommend him to follow-up with cardiology-he is has not able to follow-up with cardiology due to lack of insurance coverage.  # He has frequent urination at night.  Previously tried Cardura for BPH and had to stop due hypotension. 07/2022 S/p EGD and colonoscopy - diverticulosis, non bleeding internal hemorrhoids.  Gastritis.  INTERVAL HISTORY Mark Donaldson is a 63 y.o. male who has above history reviewed by me today presents for follow up visit for metastatic RCC He tolerates cabozantinib 20mg  daily  good appetite, denies nausea vomiting. He has 3-4 episodes of diarrhea per day. He takes otc imodium  as needed.     Review of systems- Review of Systems  Constitutional:  Negative for appetite change, chills, fatigue, fever and unexpected weight change.  HENT:   Negative for voice change.   Eyes:  Negative for eye problems and icterus.  Respiratory:  Negative for chest tightness and cough.   Cardiovascular:  Negative for chest pain and leg swelling.  Gastrointestinal:  Negative for abdominal distention, abdominal pain, blood in stool, diarrhea and rectal pain.  Endocrine: Negative for hot flashes.  Genitourinary:  Negative for difficulty urinating, dysuria, frequency and nocturia.   Musculoskeletal:  Positive for arthralgias and back pain.  Skin:  Negative for itching and rash.  Neurological:  Negative for light-headedness and numbness.  Hematological:  Negative for  adenopathy. Does not bruise/bleed easily.  Psychiatric/Behavioral:  Negative for confusion.     No Known Allergies  Patient Active Problem List   Diagnosis Date Noted   Renal cell carcinoma of left kidney (HCC) 05/23/2019    Priority: High   Thrombocytopenia (HCC) 05/19/2023    Priority: Medium    Chemotherapy induced diarrhea 02/12/2023    Priority: Medium    Stage 3b chronic kidney disease (HCC) 10/04/2019    Priority: Medium    Essential hypertension 05/18/2016    Priority: Medium    Rectal bleeding 11/24/2022    Priority: Low   IDA (iron deficiency anemia) 11/21/2021    Priority: Low   Transaminitis 01/17/2021    Priority: Low   Encounter for antineoplastic immunotherapy 07/19/2019    Priority: Low   Encounter for antineoplastic chemotherapy 07/19/2019    Priority: Low   CNS lesion 07/07/2019    Priority: Low   Goals of care, counseling/discussion 05/23/2019    Priority: Low   Need for prophylactic vaccination and inoculation against influenza 11/13/2022   Gastritis without bleeding 07/30/2022   Cough  06/03/2022   Avascular necrosis of bones of both hips (HCC) 06/20/2021   History of CHF (congestive heart failure) 05/01/2020   Hyperglycemia 02/28/2020   Brain lesion 10/04/2019   Anemia in stage 3b chronic kidney disease (HCC) 06/07/2019   Renal cell carcinoma (HCC) 05/23/2019   Palliative care encounter    History of vasculitis    Thoracic lymphadenopathy    Lung nodules    CAD S/P percutaneous coronary angioplasty 05/02/2019   Kidney mass 05/02/2019   Hydronephrosis of left kidney 05/02/2019   UTI (urinary tract infection) 05/02/2019   AKI (acute kidney injury) (HCC) 05/02/2019   Gross hematuria 05/02/2019   Chronic diastolic CHF (congestive heart failure) (HCC) 05/02/2019   Sepsis (HCC) 05/02/2019   Severe sepsis (HCC) 05/02/2019   Preop cardiovascular exam    Hyperlipidemia 05/21/2016   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) 05/21/2016    Prediabetes 05/21/2016   Alcohol abuse 05/18/2016   Vasculitis, 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HCC) 05/18/2016   Shortness of breath 05/17/2016     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HCC)    Avascular necrosis of bones of both hips (HCC)    CAD (coronary artery disease)    Essential hypertension    Gastritis without bleeding    HFrEF (heart failure with reduced ejection fraction) (HCC)    Hyperlipidemia LDL goal <70    Iron deficiency anemia    Ischemic cardiomyopathy    Lung nodules 09/2022   left upper lobe   Mediastinal adenopathy 09/2022   Renal cell carcinoma (HCC) 05/23/2019   Renal disorder    Severe sepsis (HCC)    Stage 3b chronic kidney disease (CKD) (HCC)      Past Surgical History:  Procedure Laterality Date   BIOPSY  07/30/2022   Procedure: BIOPSY;  Surgeon: Jinny Carmine, MD;  Location: [facility];  Service: Endoscopy;;   COLONOSCOPY WITH PROPOFOL  N/A 07/30/2022   Procedure: COLONOSCOPY WITH PROPOFOL ;  Surgeon: Jinny Carmine, MD;  Location: [facility];  Service: Endoscopy;  Laterality: N/A;  SPANISH INTERPRETER   ENDOBRONCHIAL ULTRASOUND Left 10/10/2022   Procedure: ENDOBRONCHIAL ULTRASOUND;  Surgeon: Tamea Dedra CROME, MD;  Location: [facility] OR[facility];  Service: Pulmonary;  Laterality: Left;   ESOPHAGOGASTRODUODENOSCOPY (EGD) WITH PROPOFOL  N/A 07/30/2022   Procedure: ESOPHAGOGASTRODUODENOSCOPY (EGD) WITH PROPOFOL ;  Surgeon: Jinny Carmine, MD;  Location: [facility];  Service: Endoscopy;  Laterality: N/A;   LAPAROSCOPIC NEPHRECTOMY, HAND ASSISTED Left 05/05/2019   Procedure: HAND ASSISTED LAPAROSCOPIC NEPHRECTOMY;  Surgeon: Penne Knee, MD;  Location: [facility] ORS;  Service: Urology;  Laterality: Left;   LEFT HEART CATH AND CORONARY ANGIOGRAPHY  05/20/2016   LAD lesion s/p PCI with Xience    Social History   Socioeconomic History   Marital status: Married    Spouse name: Mitchell   Number of children: 4   Years of education: Not on  file   Highest education level: Not on file  Occupational History   Not on file  Tobacco Use   Smoking status: Never   Smokeless tobacco: Never  Vaping Use   Vaping status: Never Used  Substance and Sexual Activity   Alcohol use: Not Currently   Drug use: Never   Sexual activity: Not on file  Other Topics Concern   Not on file  Social History Narrative   Not on file   Social Drivers of Health   Financial Resource Strain: Not on file  Food Insecurity: No Food Insecurity (07/14/2022)   Received from [facility]   Hunger Vital Sign    Within the past 12 months, you worried that your food would run out before you got the money to buy more.: Never true    Within the past 12 months, the food you bought just didn't last and you didn't have money to get more.: Never true  Transportation Needs: No Transportation Needs (07/14/2022)   Received from [facility]   PRAPARE - Transportation    Lack of Transportation (Medical): No    Lack of Transportation (Non-Medical): No  Physical Activity: Not on file  Stress: Not on file  Social Connections: Not on file  Intimate Partner Violence: Not on file     Family History  Problem Relation Age of Onset   Cancer Mother    Blindness Brother      Current Outpatient Medications:    albuterol  (VENTOLIN  HFA) 108 (90 Base) MCG/ACT inhaler, Inhale 2 puffs into the lungs every 6 (six) hours as needed for wheezing or shortness of breath., Disp: 6.7 g, Rfl: 2   amLODipine  (NORVASC ) 5 MG tablet, Take 5 mg by mouth daily., Disp: , Rfl:    aspirin 81 MG EC tablet, Take 81 mg by mouth daily. Swallow whole., Disp: , Rfl:    atorvastatin  (LIPITOR ) 80 MG tablet, Take 80 mg by mouth at bedtime., Disp: , Rfl:    cabozantinib (CABOMETYX ) 20 MG tablet, Take 1 tablet (20 mg total) by mouth daily. Take on an empty stomach, 1 hour before or 2 hours after meals., Disp: 30 tablet, Rfl: 2   clopidogrel (PLAVIX) 75 MG tablet, Take 75 mg by mouth daily., Disp:  , Rfl:    ferrous sulfate  (FEROSUL) 325 (65 FE) MG tablet, Take 1 tablet (325 mg total) by mouth 2 (two) times daily with a meal., Disp: 180 tablet, Rfl: 0   Fluticasone -Umeclidin-Vilant (TRELEGY ELLIPTA ) 100-62.5-25 MCG/ACT AEPB, Inhale 1 puff into the lungs daily., Disp: 60 each, Rfl: 11   loperamide  (IMODIUM ) 2 MG capsule, Take 1 capsule (2 mg total) by mouth See admin instructions. Initial: 4 mg with onset of diarrhea,then 2 mg after each loose bowel movements, minimal interval is 2 hours  maximum: 16 mg/day, Disp: 60 capsule, Rfl: 2   losartan (COZAAR) 100 MG tablet, Take 100 mg by mouth daily., Disp: , Rfl:    metoprolol  tartrate (LOPRESSOR ) 100 MG tablet, Take 150 mg by mouth daily., Disp: , Rfl:    mirtazapine (REMERON) 15 MG tablet, Take 15 mg by mouth at bedtime., Disp: , Rfl:    furosemide  (LASIX ) 20 MG tablet, Take 1 tablet (20 mg total) by mouth daily. (Patient not taking: Reported on 09/22/2023), Disp: 30 tablet, Rfl: 3   Physical exam:  Vitals:   09/22/23 1114  BP: 125/86  Pulse: 68  Resp: 18  Temp: (!) 96.3 F (35.7 C)  Weight: 162 lb (73.5 kg)    Physical Exam Constitutional:      General: He is not in acute distress.    Appearance: He is not diaphoretic.  HENT:     Head: Normocephalic and atraumatic.  Eyes:     General: No scleral icterus. Cardiovascular:     Rate and Rhythm: Normal rate and regular rhythm.  Pulmonary:     Effort: Pulmonary effort is normal. No respiratory distress.  Abdominal:     General: There is no distension.     Palpations: Abdomen is soft.     Tenderness: There is no abdominal tenderness.  Musculoskeletal:  General: Normal range of motion.     Cervical back: Normal range of motion.  Skin:    General: Skin is warm and dry.     Findings: No erythema.  Neurological:     Mental Status: He is alert and oriented to person, place, and time. Mental status is at baseline.     Cranial Nerves: No cranial nerve deficit.     Motor: No  abnormal muscle tone.  Psychiatric:        Mood and Affect: Mood and affect normal.       Labs    Latest Ref Rng & Units 09/22/2023   11:01 AM 07/20/2023    1:50 PM 06/19/2023   11:37 AM  CBC  WBC 4.0 - 10.5 K/uL 8.0  7.4  7.5   Hemoglobin 13.0 - 17.0 g/dL 85.7  85.3  84.7   Hematocrit 39.0 - 52.0 % 43.1  44.1  45.0   Platelets 150 - 400 K/uL 128  133  125       Latest Ref Rng & Units 09/22/2023   11:00 AM 07/20/2023    1:50 PM 06/19/2023   11:37 AM  CMP  Glucose 70 - 99 mg/dL 895  896  892   BUN 8 - 23 mg/dL 31  32  35   Creatinine 0.61 - 1.24 mg/dL 8.21  7.95  8.10   Sodium 135 - 145 mmol/L 136  136  138   Potassium 3.5 - 5.1 mmol/L 4.5  4.3  4.2   Chloride 98 - 111 mmol/L 106  107  107   CO2 22 - 32 mmol/L 22  23  22    Calcium  8.9 - 10.3 mg/dL 8.7  8.5  8.9   Total Protein 6.5 - 8.1 g/dL 6.5  6.7  6.9   Total Bilirubin 0.0 - 1.2 mg/dL 0.7  0.9  0.9   Alkaline Phos 38 - 126 U/L 87  87  104   AST 15 - 41 U/L 34  40  43   ALT 0 - 44 U/L 27  29  34       RADIOGRAPHIC STUDIES: I have personally reviewed the radiological images as listed and agreed with the findings in the report. CT Chest Wo Contrast Result Date: 07/18/2023 CLINICAL DATA:  Kidney cancer. Active surveillance of renal cell carcinoma of left kidney. EXAM: CT CHEST WITHOUT CONTRAST TECHNIQUE: Multidetector CT imaging of the chest was performed following the standard protocol without IV contrast. RADIATION DOSE REDUCTION: This exam was performed according to the departmental dose-optimization program which includes automated exposure control, adjustment of the mA and/or kV according to patient size and/or use of iterative reconstruction technique. COMPARISON:  CT chest 02/12/2023.  PET CT 10/08/2022 FINDINGS: Cardiovascular: The normal heart size. No pericardial effusions. Normal caliber thoracic aorta. Calcification of the coronary arteries. Mediastinum/Nodes: Thyroid  gland is unremarkable. Esophagus is decompressed.  Prominent mediastinal and left hilar lymphadenopathy. Largest lymph nodes in the aortopulmonary window measure 2.7 cm diameter. No change since the previous study. No new lymphadenopathy is identified. Lungs/Pleura: Patchy ground-glass infiltrates in the lungs may represent multifocal pneumonia or edema. No pleural effusion or pneumothorax. Left pulmonary nodule in the upper lung measuring 10 mm average diameter. Series 4, image 46. No change since previous study. No new nodules are identified. Upper Abdomen: No acute abnormalities. Musculoskeletal: No focal bone lesion or bone destruction. IMPRESSION: 1. Prominent mediastinal and left hilar lymphadenopathy, unchanged. Index left aortopulmonic window node measures 2.7 cm, unchanged. 2.  Left upper lung pulmonary nodule measures 10 mm average diameter. No change. 3. Patchy ground-glass infiltrates in the lungs may represent multifocal pneumonia or edema. Electronically Signed   By: Elsie Gravely M.D.   On: 07/18/2023 00:42

## 2023-09-28 ENCOUNTER — Encounter: Payer: Self-pay | Admitting: Oncology

## 2023-09-30 ENCOUNTER — Telehealth: Payer: Self-pay | Admitting: Pharmacy Technician

## 2023-09-30 ENCOUNTER — Other Ambulatory Visit: Payer: Self-pay

## 2023-09-30 DIAGNOSIS — C642 Malignant neoplasm of left kidney, except renal pelvis: Secondary | ICD-10-CM

## 2023-09-30 MED ORDER — CABOMETYX 20 MG PO TABS: 20.0000 mg | ORAL_TABLET | Freq: Every day | ORAL | 2 refills | Status: DC

## 2023-09-30 NOTE — Telephone Encounter (Addendum)
 Oral Oncology Patient Advocate Encounter   Received notification that patient is due for re-enrollment for assistance for Cabometyx  through [employer] ([employer]).  I spoke with an [employer] representative and she stated that they are requiring an attestation letter that states patient is still unemployed and uninsured.   The attestation letter has been typed up and is now pending MD signature.   Re-enrollment process has been initiated and will be submitted upon completion of necessary documents.  [employer] phone number [phone].  [employer] fax number [phone].  Tarell Schollmeyer (Patty) Chet Burnet, CPhT  [facility], [name], [facility] - Oral Chemotherapy Patient Advocate Specialist III Phone: [phone]  Fax: [phone]

## 2023-09-30 NOTE — Telephone Encounter (Signed)
 Oral Oncology Patient Advocate Encounter   Submitted attestation letter for re-enrollment for assistance for Cabometyx  to [employer].   Attestation letter submitted via e-fax to [phone]   [employer] phone number [phone].   I will continue to check the status until final determination.   Sukhmani Fetherolf (Patty) Chet Burnet, CPhT  [facility], [name], [facility] - Oral Chemotherapy Patient Advocate Specialist III Phone: [phone]  Fax: [phone]

## 2023-10-19 ENCOUNTER — Encounter: Payer: Self-pay | Admitting: Oncology

## 2023-10-19 ENCOUNTER — Other Ambulatory Visit: Payer: Self-pay | Admitting: Pulmonary Disease

## 2023-10-19 ENCOUNTER — Other Ambulatory Visit: Payer: Self-pay | Admitting: Gastroenterology

## 2023-10-19 ENCOUNTER — Other Ambulatory Visit: Payer: Self-pay

## 2023-10-19 MED ORDER — TRELEGY ELLIPTA 100-62.5-25 MCG/ACT IN AEPB
1.0000 | INHALATION_SPRAY | Freq: Every day | RESPIRATORY_TRACT | 11 refills | Status: AC
  Filled 2023-10-19: qty 180, 90d supply, fill #0

## 2023-10-20 ENCOUNTER — Other Ambulatory Visit: Payer: Self-pay

## 2023-10-22 ENCOUNTER — Other Ambulatory Visit: Payer: Self-pay

## 2023-10-27 ENCOUNTER — Other Ambulatory Visit: Payer: Self-pay

## 2023-10-27 DIAGNOSIS — C642 Malignant neoplasm of left kidney, except renal pelvis: Secondary | ICD-10-CM

## 2023-10-27 MED ORDER — CABOMETYX 20 MG PO TABS
20.0000 mg | ORAL_TABLET | Freq: Every day | ORAL | 2 refills | Status: AC
Start: 2023-10-27 — End: ?

## 2023-10-28 NOTE — Telephone Encounter (Signed)
 Oral Oncology Patient Advocate Encounter  Called to check on the status of the patient's re-enrollment application and was told by an [employer] representative that they are waiting on the prescription portion to be sent to them.   On 10/27/2023, a prescription was sent to [facility] but [employer] requires that we submit a prescription to the foundation first.  I have submitted a new prescription form for the patient's re-enrollment.   I will continue to check on the status of the re-enrollment application until final determination.  Verneal Wiers (Mark Donaldson) Chet Burnet, CPhT  [facility], [name], [facility] - Oral Chemotherapy Patient Advocate Specialist III Phone: [phone]  Fax: [phone]

## 2023-10-29 ENCOUNTER — Ambulatory Visit: Payer: Self-pay | Admitting: Pulmonary Disease

## 2023-10-29 ENCOUNTER — Encounter: Payer: Self-pay | Admitting: Pulmonary Disease

## 2023-10-29 VITALS — BP 116/84 | HR 83 | Temp 97.7°F | Ht 66.0 in | Wt 159.4 lb

## 2023-10-29 DIAGNOSIS — Z23 Encounter for immunization: Secondary | ICD-10-CM

## 2023-10-29 DIAGNOSIS — J4540 Moderate persistent asthma, uncomplicated: Secondary | ICD-10-CM

## 2023-10-29 DIAGNOSIS — I7782 Antineutrophilic cytoplasmic antibody (ANCA) vasculitis: Secondary | ICD-10-CM

## 2023-10-29 DIAGNOSIS — C642 Malignant neoplasm of left kidney, except renal pelvis: Secondary | ICD-10-CM

## 2023-10-29 LAB — NITRIC OXIDE: Nitric Oxide: 15

## 2023-10-29 NOTE — Progress Notes (Signed)
 Subjective:    Patient ID: Mark Donaldson, male    DOB: Oct 24, 1960, 63 y.o.   MRN: [id number]  Patient Care Team: [facility], [facility] as PCP - General (General Practice) Babara Call, MD as Consulting Physician (Oncology)  Chief Complaint  Patient presents with   Asthma    BACKGROUND/INTERVAL:63 year old lifelong never smoker with very complex medical history as noted below who presents for follow-up.  He was last seen on 02 October 2022.  Sequently had robotic assisted bronchoscopy, and endobronchial ultrasound on 10 October 2022.  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  He is followed by his oncologist (Dr. Babara) and is currently on cabozantinib 20 mg daily.  With regards to his respiratory issues these are mostly related to moderate persistent asthma.  He was last seen on 29 April 2023.   HPI Discussed the use of AI scribe software for clinical note transcription with the patient, who gave verbal consent to proceed.  History of Present Illness   Mark Donaldson is a 63 year old male with asthma who presents for follow-up of asthma.  He reports that he has not needed to use his rescue inhaler, albuterol . He is currently using Trelegy for asthma management.  He has a history of stage four renal cancer and is currently on cabozantinib therapy. He experiences diarrhea as a side effect of the cancer treatment, for which he takes medication as needed. He states, he sometimes gets diarrhea from the medication but cannot counteract it with Imodium .   He has a history of ANCA positive vasculitis but has not had any flareups in this regard.  He is not up to date on flu vaccine.  Overall he feels well and looks well.      Review of Systems A 10 point review of systems was performed and it is as noted above otherwise negative.   Patient Active Problem List   Diagnosis Date Noted   Thrombocytopenia  05/19/2023   Chemotherapy induced diarrhea 02/12/2023   Rectal bleeding 11/24/2022   Need for prophylactic vaccination and inoculation against influenza 11/13/2022   Gastritis without bleeding 07/30/2022   Cough 06/03/2022   IDA (iron deficiency anemia) 11/21/2021   Avascular necrosis of bones of both hips (HCC) 06/20/2021   Transaminitis 01/17/2021   History of CHF (congestive heart failure) 05/01/2020   Hyperglycemia 02/28/2020   Stage 3b chronic kidney disease (HCC) 10/04/2019   Brain lesion 10/04/2019   Encounter for antineoplastic immunotherapy 07/19/2019   Encounter for antineoplastic chemotherapy 07/19/2019   CNS lesion 07/07/2019   Anemia in stage 3b chronic kidney disease (HCC) 06/07/2019   Goals of care, counseling/discussion 05/23/2019   Renal cell carcinoma of left kidney (HCC) 05/23/2019   Renal cell carcinoma (HCC) 05/23/2019   Palliative care encounter    History of vasculitis    Thoracic lymphadenopathy    Lung nodules    CAD S/P percutaneous coronary angioplasty 05/02/2019   Kidney mass 05/02/2019   Hydronephrosis of left kidney 05/02/2019   UTI (urinary tract infection) 05/02/2019   AKI (acute kidney injury) 05/02/2019   Gross hematuria 05/02/2019   Chronic diastolic CHF (congestive heart failure) (HCC) 05/02/2019   Sepsis (HCC) 05/02/2019   Severe sepsis (HCC) 05/02/2019   Preop cardiovascular exam    Hyperlipidemia 05/21/2016   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   amLODipine  (NORVASC ) 5 MG tablet Take 5 mg by mouth daily.   aspirin 81 MG EC tablet Take 81 mg by mouth daily. Swallow whole.   atorvastatin  (LIPITOR ) 80 MG tablet Take 80 mg by mouth at bedtime.   cabozantinib (CABOMETYX ) 20 MG tablet Take 1 tablet (20 mg total) by mouth daily. Take on an empty stomach, 1 hour before or 2 hours after meals.   clopidogrel (PLAVIX) 75 MG tablet Take 75 mg by mouth daily.   ferrous sulfate  (FEROSUL) 325 (65 FE) MG tablet Take 1 tablet (325 mg total) by mouth 2 (two) times daily with a meal.   Fluticasone -Umeclidin-Vilant (TRELEGY ELLIPTA ) 100-62.5-25 MCG/ACT AEPB Inhale 1 puff into the lungs daily.   loperamide  (IMODIUM ) 2 MG capsule Take 1 capsule (2 mg total) by mouth See admin instructions. Initial: 4 mg with onset of diarrhea,then 2 mg after each loose bowel movements, minimal interval is 2 hours  maximum: 16 mg/day   losartan (COZAAR) 100 MG tablet Take 100 mg by mouth daily.   metoprolol  tartrate (LOPRESSOR ) 100 MG tablet Take 150 mg by mouth daily.   mirtazapine (REMERON) 15 MG tablet Take 15 mg by mouth at bedtime.    Immunization History  Administered Date(s) Administered   Influenza, Seasonal, Injecte, Preservative Fre 11/13/2022   Influenza,inj,Quad PF,6+ Mos 11/02/2019   Pneumococcal Conjugate-13 01/14/2007        Objective:     BP 116/84   Pulse 83   Temp 97.7 F (36.5 C) (Temporal)   Ht 5' 6 (1.676 m)   Wt 159 lb 6.4 oz (72.3 kg)   SpO2 98%   BMI 25.73 kg/m   SpO2: 98 %  GENERAL: Well-developed, well-nourished gentleman, no acute distress.  Fully ambulatory.  No conversational dyspnea. HEAD: Normocephalic, atraumatic.  EYES: Pupils equal, round, reactive to light.  No scleral icterus.  MOUTH: Dentition intact, oral mucosa moist.  No thrush. NECK: Supple. No thyromegaly. Trachea midline. No JVD.  No adenopathy. PULMONARY: Good air entry bilaterally.  No adventitious  sounds. CARDIOVASCULAR: S1 and S2. Regular rate and rhythm.  No rubs, murmurs or gallops heard.   ABDOMEN: Benign. MUSCULOSKELETAL: No joint deformity, no clubbing, no edema.  NEUROLOGIC: No overt focal deficit, no gait disturbance, speech is fluent. SKIN: Intact,warm,dry. PSYCH: Mood and behavior normal.  Lab Results  Component Value Date   NITRICOXIDE 15 10/29/2023  *This result suggests low (<25) Type II (T2) airway inflammation indicating a low likelihood of active T2-driven airway inflammation.  In a patient with active T2 driven asthma management it suggests good control.    Assessment & Plan:     ICD-10-CM   1. Moderate persistent asthma without complication  J45.40 Nitric oxide     2. Vasculitis, 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HCC)  I77.82     3. Renal cell carcinoma of left kidney metastatic to other site (HCC)  C64.2     4. Immunization due  Z23 Flu vaccine trivalent PF, 6mos and older(Flulaval,Afluria,Fluarix,Fluzone)      Orders Placed This Encounter  Procedures   Nitric oxide    Discussion:    Asthma Asthma is well-controlled with no recent use of rescue inhaler. - Continue Trelegy inhaler. - Continue as needed albuterol .  Stage 4 renal cancer Stage 4 renal cancer managed with cabozantinib, which is generally well-tolerated. - Continue cabozantinib therapy per oncology.  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 - Continue prescribed Imodium  for diarrhea as needed.     Needs influenza vaccination - Patient given influenza vaccine  See the patient in follow-up in 4 months time call sooner should any problems arise.  Advised if symptoms do not improve or worsen, to please contact office for sooner follow up or seek emergency care.    I spent 35 minutes of dedicated to the care of this patient on the date of this encounter to include pre-visit review of records, face-to-face  time with the patient discussing conditions above, post visit ordering of testing, clinical documentation with the electronic health record, making appropriate referrals as documented, and communicating necessary findings to members of the patients care team.     C. Leita Sanders, MD Advanced Bronchoscopy PCCM [facility]    *This note was generated using voice recognition software/Dragon and/or AI transcription program.  Despite best efforts to proofread, errors can occur which can change the meaning. Any transcriptional errors that result from this process are unintentional and may not be fully corrected at the time of dictation.

## 2023-10-29 NOTE — Patient Instructions (Addendum)
 VISIT SUMMARY:  Today, we discussed the management of your asthma, stage 4 renal cancer, and the side effect of diarrhea from your cancer treatment. Your asthma is well-controlled, and you have not needed to use your rescue inhaler. We also reviewed your current cancer treatment and its side effects.  YOUR PLAN:  -ASTHMA: Asthma is a condition where your airways narrow and swell, making it difficult to breathe. Your asthma is well-controlled with the Trelegy inhaler, and you have not needed to use your rescue inhaler. Continue using the Trelegy inhaler as prescribed.  -STAGE 4 RENAL CANCER: Stage 4 renal cancer is an advanced form of kidney cancer. You are currently managing this condition with cabozantinib therapy, which you are tolerating well. Continue with your cabozantinib therapy as prescribed.  -DIARRHEA SECONDARY TO CANCER THERAPY: Diarrhea is a common side effect of your cancer treatment with cabozantinib. You are managing this side effect with medication as needed. Continue taking the prescribed medication for diarrhea when necessary.    RESUMEN DE LA VISITA:  Hoy hablamos sobre el manejo de su asma, el cncer renal en estadio 4 y el efecto secundario de la diarrea asociado a su tratamiento Water quality scientist. Su asma est bien controlada y no ha necesitado usar Education administrator de rescate. Tambin revisamos su tratamiento actual contra el cncer y sus efectos secundarios.  SU PLAN:  - ASMA: El asma es una afeccin en la que las vas respiratorias se estrechan e inflaman, lo que dificulta la respiracin. Su asma est bien controlada con Therapist, nutritional Trelegy y no ha Engineer, manufacturing systems de rescate. Contine usando el inhalador Trelegy segn lo prescrito.  - CNCER RENAL EN ESTADIO 4: El cncer renal en estadio 4 es una forma avanzada de cncer de rin. Actualmente est manejando esta afeccin con cabozantinib, el cual tolera bien. Contine con su terapia con cabozantinib segn lo  prescrito.  - DIARREA SECUNDARIA AL TRATAMIENTO CONTRA EL CNCER: La diarrea es un efecto secundario comn de su tratamiento contra el cncer con cabozantinib. Este efecto secundario se controla con medicamentos segn sea necesario. Contine tomando el medicamento recetado para la diarrea cuando sea necesario.

## 2023-10-30 NOTE — Telephone Encounter (Signed)
 Oral Oncology Patient Advocate Encounter  Called to check on the status of the patient's re-enrollment application. Per [employer] representative, the application is still under review and waiting for upper management approval.  I will continue to check on the status until final determination.  [employer] phone number [phone].   Mark Donaldson (Mark) Chet Donaldson, CPhT  [facility], [name], [facility] - Oral Chemotherapy Patient Advocate Specialist III Phone: [phone]  Fax: [phone]

## 2023-11-05 ENCOUNTER — Encounter: Payer: Self-pay | Admitting: Oncology

## 2023-11-05 NOTE — Telephone Encounter (Signed)
 Oral Oncology Patient Advocate Encounter   Received notification that the application for assistance for Cabometyx  through [employer] has been approved.   [employer] phone number [phone].   Effective dates: 11/05/2023 through 11/04/2024  Medication will be filled at [facility].  I have spoken to the patient.  Mark Donaldson (Patty) Chet Burnet, CPhT  [facility], [name], [facility] - Oral Chemotherapy Patient Advocate Specialist III Phone: [phone]  Fax: [phone]

## 2023-11-19 ENCOUNTER — Ambulatory Visit
Admission: RE | Admit: 2023-11-19 | Discharge: 2023-11-19 | Disposition: A | Discharge status: Home / Self Care | Payer: Self-pay | Source: Ambulatory Visit | Attending: Oncology | Admitting: Oncology

## 2023-11-19 DIAGNOSIS — C642 Malignant neoplasm of left kidney, except renal pelvis: Secondary | ICD-10-CM | POA: Insufficient documentation

## 2023-12-03 ENCOUNTER — Inpatient Hospital Stay: Payer: Self-pay | Admitting: Oncology

## 2023-12-03 ENCOUNTER — Inpatient Hospital Stay: Payer: Self-pay | Attending: Oncology

## 2023-12-03 NOTE — Assessment & Plan Note (Deleted)
 Stage IV renal cell carcinoma, 1st line treatment with Keytruda  and Axitinib , he did not tolerate Axitinib .  S/p 34 cycles of Keytruda  [ about 2 years's treatment] - stopped in Sept 2023 --> 09/2022 Biopsy via bronchoscopy pathology showed disease recurrence, metastatic clear cell carcinoma  He did not tolerate Carbozantinib 60mg  or 40mg  daily, currently on second dose reduction of Carbozantinib 20mg  daily Labs are reviewed and discussed with patient.  he tolerates well and continue current regimen.

## 2024-01-13 ENCOUNTER — Encounter: Payer: Self-pay | Admitting: Oncology

## 2024-01-13 ENCOUNTER — Inpatient Hospital Stay: Payer: Self-pay | Attending: Oncology

## 2024-01-13 ENCOUNTER — Inpatient Hospital Stay: Payer: Self-pay | Admitting: Oncology

## 2024-01-13 VITALS — BP 111/80 | HR 74 | Temp 97.0°F | Ht 66.0 in | Wt 160.0 lb

## 2024-01-13 DIAGNOSIS — N1832 Chronic kidney disease, stage 3b: Secondary | ICD-10-CM | POA: Insufficient documentation

## 2024-01-13 DIAGNOSIS — C642 Malignant neoplasm of left kidney, except renal pelvis: Secondary | ICD-10-CM | POA: Insufficient documentation

## 2024-01-13 DIAGNOSIS — D696 Thrombocytopenia, unspecified: Secondary | ICD-10-CM | POA: Insufficient documentation

## 2024-01-13 DIAGNOSIS — Z79899 Other long term (current) drug therapy: Secondary | ICD-10-CM | POA: Insufficient documentation

## 2024-01-13 DIAGNOSIS — I10 Essential (primary) hypertension: Secondary | ICD-10-CM

## 2024-01-13 DIAGNOSIS — K521 Toxic gastroenteritis and colitis: Secondary | ICD-10-CM

## 2024-01-13 DIAGNOSIS — T451X5A Adverse effect of antineoplastic and immunosuppressive drugs, initial encounter: Secondary | ICD-10-CM

## 2024-01-13 LAB — CMP (CANCER CENTER ONLY)
ALT: 37 U/L (ref 0–44)
AST: 50 U/L — ABNORMAL HIGH (ref 15–41)
Albumin: 3.9 g/dL (ref 3.5–5.0)
Alkaline Phosphatase: 115 U/L (ref 38–126)
Anion gap: 9 (ref 5–15)
BUN: 31 mg/dL — ABNORMAL HIGH (ref 8–23)
CO2: 25 mmol/L (ref 22–32)
Calcium: 9.5 mg/dL (ref 8.9–10.3)
Chloride: 104 mmol/L (ref 98–111)
Creatinine: 1.77 mg/dL — ABNORMAL HIGH (ref 0.61–1.24)
GFR, Estimated: 43 mL/min — ABNORMAL LOW
Glucose, Bld: 105 mg/dL — ABNORMAL HIGH (ref 70–99)
Potassium: 5 mmol/L (ref 3.5–5.1)
Sodium: 138 mmol/L (ref 135–145)
Total Bilirubin: 0.6 mg/dL (ref 0.0–1.2)
Total Protein: 6.5 g/dL (ref 6.5–8.1)

## 2024-01-13 LAB — CBC WITH DIFFERENTIAL (CANCER CENTER ONLY)
Abs Immature Granulocytes: 0.02 K/uL (ref 0.00–0.07)
Basophils Absolute: 0 K/uL (ref 0.0–0.1)
Basophils Relative: 0 %
Eosinophils Absolute: 0.3 K/uL (ref 0.0–0.5)
Eosinophils Relative: 4 %
HCT: 44.8 % (ref 39.0–52.0)
Hemoglobin: 14.7 g/dL (ref 13.0–17.0)
Immature Granulocytes: 0 %
Lymphocytes Relative: 22 %
Lymphs Abs: 1.6 K/uL (ref 0.7–4.0)
MCH: 30.6 pg (ref 26.0–34.0)
MCHC: 32.8 g/dL (ref 30.0–36.0)
MCV: 93.3 fL (ref 80.0–100.0)
Monocytes Absolute: 0.6 K/uL (ref 0.1–1.0)
Monocytes Relative: 9 %
Neutro Abs: 4.8 K/uL (ref 1.7–7.7)
Neutrophils Relative %: 65 %
Platelet Count: 147 K/uL — ABNORMAL LOW (ref 150–400)
RBC: 4.8 MIL/uL (ref 4.22–5.81)
RDW: 14.4 % (ref 11.5–15.5)
WBC Count: 7.3 K/uL (ref 4.0–10.5)
nRBC: 0 % (ref 0.0–0.2)

## 2024-01-14 ENCOUNTER — Encounter: Payer: Self-pay | Admitting: Oncology

## 2024-01-14 NOTE — Assessment & Plan Note (Signed)
 avoid nephrotoxins. Encourage oral hydration

## 2024-01-14 NOTE — Assessment & Plan Note (Signed)
"  Improved         "

## 2024-01-14 NOTE — Assessment & Plan Note (Signed)
 Stage IV renal cell carcinoma, 1st line treatment with Keytruda  and Axitinib , he did not tolerate Axitinib .  S/p 34 cycles of Keytruda  [ about 2 years's treatment] - stopped in Sept 2023 --> 09/2022 Biopsy via bronchoscopy pathology showed disease recurrence, metastatic clear cell carcinoma  He did not tolerate Carbozantinib 60mg  or 40mg  daily, currently on second dose reduction of Carbozantinib 20mg  daily Labs are reviewed and discussed with patient.  he tolerates well and continue current regimen.

## 2024-01-14 NOTE — Progress Notes (Signed)
 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  S/p 34 cycles of Keytruda  [ about 2 years's treatment] - stopped in Sept 2023 --> 09/2022 Biopsy via bronchoscopy pathology showed disease recurrence, metastatic clear cell carcinoma  He did not tolerate Carbozantinib 60mg  or 40mg  daily, currently on second dose reduction of Carbozantinib 20mg  daily Labs are reviewed and discussed with patient.  he tolerates well and continue current regimen.   Chemotherapy induced diarrhea Improved.  Essential hypertension due to Carbozantinib, better controlled.  Continue current BP regimen  Stage 3b chronic kidney disease (HCC) avoid nephrotoxins. Encourage oral hydration   Thrombocytopenia Stable counts. Monitor    Orders Placed This Encounter  Procedures   CMP (Cancer Center only)    Standing Status:   Future    Expected Date:   03/12/2024    Expiration Date:   06/10/2024   CBC with Differential (Cancer Center Only)    Standing Status:   Future    Expected Date:   03/12/2024    Expiration Date:   06/10/2024   CBC with Differential (Cancer Center Only)    Standing Status:   Future    Expected Date:   03/12/2024    Expiration Date:   06/10/2024   CMP (Cancer Center only)    Standing Status:   Future    Expected Date:   03/12/2024    Expiration Date:   06/10/2024   Patient is Spanish-speaking.   Spanish interpreter service utilized for entire encounter   Return visit  2 months All questions were answered. The patient knows to call the clinic with any problems,  questions or concerns.  Call Babara, MD, PhD [facility] Hematology Oncology 01/13/2024   PERTINENT HISTORY-  64 y.o. male presents for follow-up of renal cell carcinoma  Oncology History  Renal cell carcinoma of left kidney (HCC)  05/23/2019 Initial Diagnosis   Renal cell carcinoma of left kidney   -05/02/2019-05/07/2019 due to gross hematuria. CT showed a 7.4 cm left lower pole renal mass concerning for RCC.  Patient also has necrotic lymphadenopathy in the mediastinum and the right supra hilar region which were highly suspicious for metastatic disease.  Bilateral lung nodules, also concerning for metastatic lung disease.  Right retrocrural lymphadenopathy with upper normal left periaortic lymph node. Patient was recommended for cytoreductive radical nephrectomy.  With history of ANCA positive vasculitis, also recommend thoracic lymphadenopathy biopsy to confirm distal metastasis.  Patient agreed to radical nephrectomy and declined bronchoscopy biopsy. He underwent left radical nephrectomy on 05/05/2019. Pathology showed pT3a pNx, RCC, conventional clear cell type, grade 3    05/23/2019 Cancer Staging   Staging form: Kidney, AJCC 8th Edition - Pathologic: Stage IV (pT3a, pNX, cM1) - Signed by Babara Call, MD on 05/23/2019   06/07/2019 - 09/25/2021 Chemotherapy   Keytruda  + Axitinib .  06/07/19 He started Axitinib  5mg  BID, stopped on 07/25/2020 due to AKI, resumed on 10/04/2019, stopped again on 05/01/2020 due to diarrhea. 09/11/2020 resumed axitinib  5 mg twice daily, stopped on 10/23/2020 due to diarrhea.  01/17/2021 Keytruda  was held due to transaminitis,  immunotherapy induced liver toxicities. 02/20/2021, resumed Keytruda .  09/25/2021 last dose of Keytruda     01/12/2020 Imaging   CT showed overall marked improvement.  Comparing to CT scan in August 2021, continues to have treatment response.Mediastinal soft tissue nodule nearly completely resolved, right minor fissure nodule 8 mm, decreased in size.   Resolution of pleural fluid and thickening of the right chest.  Small pleural effusion in the left chest.  No new lung nodules.-Subtle variation of the muscle architecture of the left psoas muscle of unknown significance   04/30/2020 Imaging   CT chest abdomen pelvis without contrast showed stable disease   11/28/2020 Imaging   CT abdomen pelvis without contrast showed stable examination.  No evidence of new or progressive disease in the chest abdomen pelvis.  Stable to minimally decreased size of 5 mm pulmonary nodule in the minor fissure in the right chest.  Sigmoid colon diverticulosis without findings of acute diverticulitis.   03/08/2021 Imaging   CT scan showed NED   07/31/2021 Imaging   CT chest abdomen pelvis showed stable disease   08/27/2021 Imaging   MRI brain with and without contrast Decrease in size of lesions presumably reflecting neurocysticercosis. No evidence new metastatic disease   08/29/2021 Imaging   CT Chest abdomen pelvis w contrast 1. No findings of active malignancy. 6 by 4 mm right upper lobe nodule along the major fissure is stable and likely represents a previously treated metastatic lesion. 2. Coronary artery atherosclerosis disproportionate to atherosclerosis elsewhere in the chest, abdomen, and pelvis. 3. Mild surgical bronchiectasis in both lower lobes. 4. Mild prostatomegaly. 5. 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 Status post left nephrectomy, without recurrent or metastatic disease. 2. Similar small right-sided pulmonary nodules. 3. New or progressive posterior left upper lobe clustered nodularity and ground-glass, suspicious for minimal infection. 4.  Coronary artery atherosclerosi   09/25/2022 Imaging   CT chest abdomen pelvis w contrast showed CHEST:   1. New nodularity in the LEFT upper lobe with new mediastinal lymphadenopathy. Findings are most consistent  with metastatic renal cell carcinoma. 2. No skeletal metastasis.   PELVIS:   1. No evidence of local recurrence in the LEFT nephrectomy bed. 2. No evidence of metastatic disease in the abdomen pelvis.    10/08/2022 Imaging   PET scan showed  1. Hypermetabolic mediastinal and left hilar lymphadenopathy with hypermetabolic nodularity in the posterior left upper lobe. Imaging features are compatible with metastatic disease. 2. No evidence for metastatic disease in the abdomen or pelvis. 3. Changes of avascular necrosis in the femoral heads bilaterally.    10/10/2022 Relapse/Recurrence   S/p biopsy via bronchoscopy  1. Lung, biopsy, Left upper lobe :       - METASTATIC CLEAR CELL RENAL CELL CARCINOMA   Bronchial Brushing Lung, upper lobe  Fine Needle Aspiration  - POSITIVE FOR MALIGNANCY.       - CONSISTENT WITH METASTATIC CLEAR CELL RENAL CELL CARCINOMA.     12/10/2022 - 02/10/2023 Chemotherapy   cabozantinib 40mg  daily, stopped due diarrhea, poor appetite, bad taste   02/12/2023 Imaging   CT chest abdomen pelvis wo contrast showed  1. Decreased size of the thoracic lymph nodes and left upper lobe nodularity. 2. No evidence of new or progressive metastatic disease in the chest, abdomen or pelvis. 3. Colonic diverticulosis without findings of acute diverticulitis. 4. Avascular necrosis of the femoral heads.   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  Patient has been referred to orthopedic surgeon in [place] which he is not able to go due to transportation.  He does not have insurance as well.  He is currently asymptomatic.  Monitor.  Patient has past medical history CAD status post PCI to LAD in 2018, on aspirin and Plavix, CHF, history of focal sclerosing/crescentic GN-ANCA positive and CKD stage III. 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 He finished 14 days of  albendazole  plus praziquantel  .   He has had few side effects including poor appetite, hair loss, weight loss, bloody stool, abdominal cramping or diarrhea. All symptoms have improved since he completed the treatment. 03/16/2020 he finished tapering course of steroids  #Intermittent orthopnea,Pre-existing diastolic CHF, 05/04/2019 LVEF 60 to 65%.   Recommend him to follow-up with cardiology-he is has not able to follow-up with cardiology due to lack of insurance coverage.  # He has frequent urination at night.  Previously tried Cardura for BPH and had to stop due hypotension. 07/2022 S/p EGD and colonoscopy - diverticulosis, non bleeding internal hemorrhoids.  Gastritis.  INTERVAL HISTORY Mark Donaldson is a 64 y.o. male who has above history reviewed by me today presents for follow up visit for metastatic RCC He tolerates cabozantinib 20mg  daily  good appetite, denies nausea vomiting.  Denies any diarrhea episodes.  No new complaints.    Review of systems- Review of Systems  Constitutional:  Negative for appetite change, chills, fatigue, fever and unexpected weight change.  HENT:   Negative for voice change.   Eyes:  Negative for eye problems and icterus.  Respiratory:  Negative for chest tightness and cough.   Cardiovascular:  Negative for chest pain and leg swelling.  Gastrointestinal:  Negative for abdominal distention, abdominal pain, blood in stool, diarrhea and rectal pain.  Endocrine: Negative for hot flashes.  Genitourinary:  Negative for difficulty urinating, dysuria, frequency and nocturia.   Musculoskeletal:  Positive for arthralgias and back pain.  Skin:  Negative for itching and rash.  Neurological:  Negative for light-headedness and numbness.  Hematological:  Negative for adenopathy. Does not bruise/bleed easily.  Psychiatric/Behavioral:  Negative for confusion.     No Known Allergies  Patient Active Problem List   Diagnosis Date Noted   Renal cell carcinoma of  left kidney (HCC) 05/23/2019    Priority: High   Thrombocytopenia 05/19/2023    Priority: Medium    Chemotherapy induced diarrhea 02/12/2023    Priority: Medium    Stage 3b chronic kidney disease (HCC) 10/04/2019    Priority: Medium    Essential hypertension 05/18/2016    Priority: Medium    Rectal bleeding 11/24/2022    Priority: Low   IDA (iron deficiency anemia) 11/21/2021    Priority: Low   Transaminitis 01/17/2021    Priority: Low   Encounter for antineoplastic immunotherapy 07/19/2019    Priority: Low   Encounter for antineoplastic chemotherapy 07/19/2019    Priority: Low   CNS lesion 07/07/2019    Priority: Low   Goals of care, counseling/discussion 05/23/2019    Priority: Low   Need for prophylactic vaccination and inoculation against influenza 11/13/2022   Gastritis without bleeding 07/30/2022   Cough 06/03/2022   Avascular necrosis of bones of both hips (HCC) 06/20/2021   History of CHF (congestive heart failure) 05/01/2020   Hyperglycemia 02/28/2020   Brain lesion 10/04/2019   Anemia in stage  3b chronic kidney disease (HCC) 06/07/2019   Renal cell carcinoma (HCC) 05/23/2019   Palliative care encounter    History of vasculitis    Thoracic lymphadenopathy    Lung nodules    CAD S/P percutaneous coronary angioplasty 05/02/2019   Kidney mass 05/02/2019   Hydronephrosis of left kidney 05/02/2019   UTI (urinary tract infection) 05/02/2019   AKI (acute kidney injury) 05/02/2019   Gross hematuria 05/02/2019   Chronic diastolic CHF (congestive heart failure) (HCC) 05/02/2019   Sepsis (HCC) 05/02/2019   Severe sepsis (HCC) 05/02/2019   Preop cardiovascular exam    Hyperlipidemia 05/21/2016   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) 05/21/2016   Prediabetes 05/21/2016   Alcohol abuse 05/18/2016   Vasculitis, 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HCC) 05/18/2016   Shortness of breath 05/17/2016     Past Medical History:  Diagnosis Date   ANCA-positive vasculitis  (HCC)    Avascular necrosis of bones of both hips (HCC)    CAD (coronary artery disease)    Essential hypertension    Gastritis without bleeding    HFrEF (heart failure with reduced ejection fraction) (HCC)    Hyperlipidemia LDL goal <70    Iron deficiency anemia    Ischemic cardiomyopathy    Lung nodules 09/2022   left upper lobe   Mediastinal adenopathy 09/2022   Renal cell carcinoma (HCC) 05/23/2019   Renal disorder    Severe sepsis (HCC)    Stage 3b chronic kidney disease (CKD) (HCC)      Past Surgical History:  Procedure Laterality Date   BIOPSY  07/30/2022   Procedure: BIOPSY;  Surgeon: Jinny Carmine, MD;  Location: [facility];  Service: Endoscopy;;   COLONOSCOPY WITH PROPOFOL  N/A 07/30/2022   Procedure: COLONOSCOPY WITH PROPOFOL ;  Surgeon: Jinny Carmine, MD;  Location: [facility];  Service: Endoscopy;  Laterality: N/A;  SPANISH INTERPRETER   ENDOBRONCHIAL ULTRASOUND Left 10/10/2022   Procedure: ENDOBRONCHIAL ULTRASOUND;  Surgeon: Tamea Dedra CROME, MD;  Location: [facility];  Service: Pulmonary;  Laterality: Left;   ESOPHAGOGASTRODUODENOSCOPY (EGD) WITH PROPOFOL  N/A 07/30/2022   Procedure: ESOPHAGOGASTRODUODENOSCOPY (EGD) WITH PROPOFOL ;  Surgeon: Jinny Carmine, MD;  Location: [facility];  Service: Endoscopy;  Laterality: N/A;   LAPAROSCOPIC NEPHRECTOMY, HAND ASSISTED Left 05/05/2019   Procedure: HAND ASSISTED LAPAROSCOPIC NEPHRECTOMY;  Surgeon: Penne Knee, MD;  Location: [facility];  Service: Urology;  Laterality: Left;   LEFT HEART CATH AND CORONARY ANGIOGRAPHY  05/20/2016   LAD lesion s/p PCI with Xience    Social History   Socioeconomic History   Marital status: Married    Spouse name: Izel   Number of children: 4   Years of education: Not on file   Highest education level: Not on file  Occupational History   Not on file  Tobacco Use   Smoking status: Never   Smokeless tobacco: Never  Vaping Use   Vaping status: Never Used  Substance and  Sexual Activity   Alcohol use: Not Currently   Drug use: Never   Sexual activity: Not on file  Other Topics Concern   Not on file  Social History Narrative   Not on file   Social Drivers of Health   Tobacco Use: Low Risk (01/13/2024)   Patient History    Smoking Tobacco Use: Never    Smokeless Tobacco Use: Never    Passive Exposure: Not on file  Financial Resource Strain: Not on file  Food Insecurity: No Food Insecurity (07/14/2022)   Received from [facility]   Epic  Within the past 12 months, you worried that your food would run out before you got the money to buy more.: Never true    Within the past 12 months, the food you bought just didn't last and you didn't have money to get more.: Never true  Transportation Needs: No Transportation Needs (07/14/2022)   Received from [facility]   PRAPARE - Transportation    Lack of Transportation (Medical): No    Lack of Transportation (Non-Medical): No  Physical Activity: Not on file  Stress: Not on file  Social Connections: Not on file  Intimate Partner Violence: Not on file  Depression (PHQ2-9): Low Risk (01/13/2024)   Depression (PHQ2-9)    PHQ-2 Score: 1  Alcohol Screen: Not on file  Housing: Unknown (07/14/2022)   Received from [facility]   Epic    In the last 12 months, was there a time when you were not able to pay the mortgage or rent on time?: No    Number of Times Moved in the Last Year: Not on file    At any time in the past 12 months, were you homeless or living in a shelter (including now)?: No  Utilities: Not on file  Health Literacy: Not on file     Family History  Problem Relation Age of Onset   Cancer Mother    Blindness Brother      Current Outpatient Medications:    albuterol  (VENTOLIN  HFA) 108 (90 Base) MCG/ACT inhaler, Inhale 2 puffs into the lungs every 6 (six) hours as needed for wheezing or shortness of breath., Disp: 6.7 g, Rfl: 2   aspirin 81 MG EC tablet, Take 81 mg by mouth  daily. Swallow whole., Disp: , Rfl:    atorvastatin  (LIPITOR ) 80 MG tablet, Take 80 mg by mouth at bedtime., Disp: , Rfl:    cabozantinib (CABOMETYX ) 20 MG tablet, Take 1 tablet (20 mg total) by mouth daily. Take on an empty stomach, 1 hour before or 2 hours after meals., Disp: 30 tablet, Rfl: 2   clopidogrel (PLAVIX) 75 MG tablet, Take 75 mg by mouth daily., Disp: , Rfl:    ferrous sulfate  (FEROSUL) 325 (65 FE) MG tablet, Take 1 tablet (325 mg total) by mouth 2 (two) times daily with a meal., Disp: 180 tablet, Rfl: 0   Fluticasone -Umeclidin-Vilant (TRELEGY ELLIPTA ) 100-62.5-25 MCG/ACT AEPB, Inhale 1 puff into the lungs daily., Disp: 60 each, Rfl: 11   furosemide  (LASIX ) 20 MG tablet, Take 1 tablet (20 mg total) by mouth daily., Disp: 30 tablet, Rfl: 3   loperamide  (IMODIUM ) 2 MG capsule, Take 1 capsule (2 mg total) by mouth See admin instructions. Initial: 4 mg with onset of diarrhea,then 2 mg after each loose bowel movements, minimal interval is 2 hours  maximum: 16 mg/day, Disp: 60 capsule, Rfl: 2   losartan (COZAAR) 100 MG tablet, Take 100 mg by mouth daily., Disp: , Rfl:    metoprolol  tartrate (LOPRESSOR ) 100 MG tablet, Take 150 mg by mouth daily., Disp: , Rfl:    mirtazapine (REMERON) 15 MG tablet, Take 15 mg by mouth at bedtime., Disp: , Rfl:    Physical exam:  Vitals:   01/13/24 1312 01/13/24 1322  BP: (!) 118/92 111/80  Pulse: 74   Temp: (!) 97 F (36.1 C)   TempSrc: Tympanic   SpO2: 99%   Weight: 160 lb (72.6 kg)   Height: 5' 6 (1.676 m)     Physical Exam Constitutional:      General: He  is not in acute distress.    Appearance: He is not diaphoretic.  HENT:     Head: Normocephalic and atraumatic.  Eyes:     General: No scleral icterus. Cardiovascular:     Rate and Rhythm: Normal rate and regular rhythm.  Pulmonary:     Effort: Pulmonary effort is normal. No respiratory distress.  Abdominal:     General: There is no distension.     Palpations: Abdomen is soft.      Tenderness: There is no abdominal tenderness.  Musculoskeletal:        General: Normal range of motion.     Cervical back: Normal range of motion.  Skin:    General: Skin is warm and dry.     Findings: No erythema.  Neurological:     Mental Status: He is alert and oriented to person, place, and time. Mental status is at baseline.     Cranial Nerves: No cranial nerve deficit.     Motor: No abnormal muscle tone.  Psychiatric:        Mood and Affect: Mood and affect normal.       Labs    Latest Ref Rng & Units 01/13/2024    1:08 PM 09/22/2023   11:01 AM 07/20/2023    1:50 PM  CBC  WBC 4.0 - 10.5 K/uL 7.3  8.0  7.4   Hemoglobin 13.0 - 17.0 g/dL 85.2  85.7  85.3   Hematocrit 39.0 - 52.0 % 44.8  43.1  44.1   Platelets 150 - 400 K/uL 147  128  133       Latest Ref Rng & Units 01/13/2024    1:08 PM 09/22/2023   11:00 AM 07/20/2023    1:50 PM  CMP  Glucose 70 - 99 mg/dL 894  895  896   BUN 8 - 23 mg/dL 31  31  32   Creatinine 0.61 - 1.24 mg/dL 8.22  8.21  7.95   Sodium 135 - 145 mmol/L 138  136  136   Potassium 3.5 - 5.1 mmol/L 5.0  4.5  4.3   Chloride 98 - 111 mmol/L 104  106  107   CO2 22 - 32 mmol/L 25  22  23    Calcium  8.9 - 10.3 mg/dL 9.5  8.7  8.5   Total Protein 6.5 - 8.1 g/dL 6.5  6.5  6.7   Total Bilirubin 0.0 - 1.2 mg/dL 0.6  0.7  0.9   Alkaline Phos 38 - 126 U/L 115  87  87   AST 15 - 41 U/L 50  34  40   ALT 0 - 44 U/L 37  27  29       RADIOGRAPHIC STUDIES: I have personally reviewed the radiological images as listed and agreed with the findings in the report. CT CHEST ABDOMEN PELVIS WO CONTRAST Result Date: 11/24/2023 CLINICAL DATA:  Left renal cell carcinoma, thoracic lymphadenopathy * Tracking Code: BO * EXAM: CT CHEST, ABDOMEN AND PELVIS WITHOUT CONTRAST TECHNIQUE: Multidetector CT imaging of the chest, abdomen and pelvis was performed following the standard protocol without IV contrast. RADIATION DOSE REDUCTION: This exam was performed according to the  departmental dose-optimization program which includes automated exposure control, adjustment of the mA and/or kV according to patient size and/or use of iterative reconstruction technique. COMPARISON:  CT chest, 07/10/2023, CT chest abdomen pelvis, 02/12/2023, PET-CT, 10/08/2022 FINDINGS: CT CHEST FINDINGS Cardiovascular: No significant vascular findings. Normal heart size. Left and right coronary artery calcifications and  stents. No pericardial effusion. Mediastinum/Nodes: Numerous enlarged mediastinal and left hilar lymph nodes are not appreciably changed, prevascular node measuring 2.6 x 2.5 cm (series 2, image 24), left hilar node measuring 3.0 x 2.7 cm (series 2, image 32). Thyroid  gland, trachea, and esophagus demonstrate no significant findings. Lungs/Pleura: Similar postobstructive atelectasis and consolidation in the suprahilar left upper lobe (series 4, image 47). No pleural effusion or pneumothorax. Musculoskeletal: No chest wall abnormality. No acute osseous findings. CT ABDOMEN PELVIS FINDINGS Hepatobiliary: No solid liver abnormality is seen. No gallstones, gallbladder wall thickening, or biliary dilatation. Pancreas: Unremarkable. No pancreatic ductal dilatation or surrounding inflammatory changes. Spleen: Normal in size without significant abnormality. Adrenals/Urinary Tract: Adrenal glands are unremarkable. Left nephrectomy. No noncontrast evidence of recurrent soft tissue in the nephrectomy bed. Right kidney is normal, without renal calculi, solid lesion, or hydronephrosis. Bladder is unremarkable. Stomach/Bowel: Stomach is within normal limits. Appendix appears normal. No evidence of bowel wall thickening, distention, or inflammatory changes. Occasional sigmoid diverticula. Vascular/Lymphatic: No significant vascular findings are present. No enlarged abdominal or pelvic lymph nodes. Reproductive: Prostatomegaly. Other: No abdominal wall hernia or abnormality. No ascites. Musculoskeletal: No acute  osseous findings. Bilateral femoral head avascular necrosis (series 5, image 70). IMPRESSION: 1. Numerous enlarged metastatic mediastinal and left hilar lymph nodes are not appreciably changed. 2. Similar postobstructive atelectasis and consolidation in the suprahilar left upper lobe. 3. Left nephrectomy. No noncontrast evidence of recurrent soft tissue in the nephrectomy bed. 4. No noncontrast evidence of lymphadenopathy or metastatic disease in the abdomen or pelvis. 5. Prostatomegaly. 6. Coronary artery disease. Electronically Signed   By: Marolyn JONETTA Jaksch M.D.   On: 11/24/2023 09:10     "

## 2024-01-14 NOTE — Assessment & Plan Note (Signed)
 due to Carbozantinib, better controlled.  Continue current BP regimen

## 2024-01-14 NOTE — Assessment & Plan Note (Signed)
 Stable counts.  Monitor.

## 2024-01-21 ENCOUNTER — Other Ambulatory Visit: Payer: Self-pay

## 2024-01-25 ENCOUNTER — Other Ambulatory Visit: Payer: Self-pay

## 2024-03-14 ENCOUNTER — Inpatient Hospital Stay: Payer: Self-pay | Admitting: Oncology

## 2024-03-14 ENCOUNTER — Inpatient Hospital Stay: Payer: Self-pay
